# Patient Record
Sex: Male | Born: 1940
Health system: Southern US, Community
[De-identification: ages and names within clinical notes are randomized; demographics above are authoritative.]

## PROBLEM LIST (undated history)

## (undated) DIAGNOSIS — I420 Dilated cardiomyopathy: Secondary | ICD-10-CM

## (undated) DIAGNOSIS — M129 Arthropathy, unspecified: Secondary | ICD-10-CM

## (undated) DIAGNOSIS — Z87442 Personal history of urinary calculi: Secondary | ICD-10-CM

## (undated) DIAGNOSIS — IMO0002 Reserved for concepts with insufficient information to code with codable children: Principal | ICD-10-CM

## (undated) DIAGNOSIS — C4490 Unspecified malignant neoplasm of skin, unspecified: Secondary | ICD-10-CM

## (undated) DIAGNOSIS — G4700 Insomnia, unspecified: Secondary | ICD-10-CM

## (undated) DIAGNOSIS — I509 Heart failure, unspecified: Secondary | ICD-10-CM

## (undated) DIAGNOSIS — Z77090 Contact with and (suspected) exposure to asbestos: Secondary | ICD-10-CM

## (undated) DIAGNOSIS — I10 Essential (primary) hypertension: Secondary | ICD-10-CM

## (undated) DIAGNOSIS — K5730 Diverticulosis of large intestine without perforation or abscess without bleeding: Secondary | ICD-10-CM

## (undated) DIAGNOSIS — H409 Unspecified glaucoma: Secondary | ICD-10-CM

## (undated) DIAGNOSIS — IMO0001 Reserved for inherently not codable concepts without codable children: Secondary | ICD-10-CM

## (undated) DIAGNOSIS — E079 Disorder of thyroid, unspecified: Secondary | ICD-10-CM

## (undated) DIAGNOSIS — Z9289 Personal history of other medical treatment: Secondary | ICD-10-CM

## (undated) DIAGNOSIS — N400 Enlarged prostate without lower urinary tract symptoms: Secondary | ICD-10-CM

## (undated) DIAGNOSIS — C140 Malignant neoplasm of pharynx, unspecified: Secondary | ICD-10-CM

## (undated) DIAGNOSIS — E7800 Pure hypercholesterolemia, unspecified: Secondary | ICD-10-CM

## (undated) HISTORY — DX: Pure hypercholesterolemia, unspecified: E78.00

## (undated) HISTORY — DX: Benign prostatic hyperplasia without lower urinary tract symptoms: N40.0

## (undated) HISTORY — DX: Reserved for concepts with insufficient information to code with codable children: IMO0002

## (undated) HISTORY — DX: Dilated cardiomyopathy: I42.0

## (undated) HISTORY — DX: Disorder of thyroid, unspecified: E07.9

## (undated) HISTORY — DX: Personal history of other medical treatment: Z92.89

## (undated) HISTORY — DX: Essential (primary) hypertension: I10

## (undated) HISTORY — DX: Arthropathy, unspecified: M12.9

## (undated) HISTORY — DX: Reserved for inherently not codable concepts without codable children: IMO0001

## (undated) HISTORY — DX: Heart failure, unspecified: I50.9

## (undated) HISTORY — DX: Malignant neoplasm of pharynx, unspecified: C14.0

## (undated) HISTORY — PX: LYMPH NODE BIOPSY: SHX201

## (undated) HISTORY — DX: Diverticulosis of large intestine without perforation or abscess without bleeding: K57.30

## (undated) HISTORY — DX: Insomnia, unspecified: G47.00

## (undated) HISTORY — DX: Personal history of urinary calculi: Z87.442

## (undated) HISTORY — DX: Unspecified glaucoma: H40.9

---

## 2001-09-28 ENCOUNTER — Encounter: Admission: RE | Admit: 2001-09-28 | Discharge: 2001-09-28 | Payer: Self-pay | Admitting: Family Medicine

## 2001-09-28 ENCOUNTER — Encounter: Payer: Self-pay | Admitting: Family Medicine

## 2001-10-11 ENCOUNTER — Emergency Department (HOSPITAL_COMMUNITY): Admission: EM | Admit: 2001-10-11 | Discharge: 2001-10-12 | Payer: Self-pay | Admitting: Emergency Medicine

## 2001-10-12 ENCOUNTER — Encounter: Payer: Self-pay | Admitting: Emergency Medicine

## 2001-10-16 ENCOUNTER — Ambulatory Visit (HOSPITAL_COMMUNITY): Admission: RE | Admit: 2001-10-16 | Discharge: 2001-10-16 | Payer: Self-pay | Admitting: Cardiology

## 2001-10-17 HISTORY — PX: CARDIAC CATHETERIZATION: SHX172

## 2003-12-06 ENCOUNTER — Emergency Department (HOSPITAL_COMMUNITY): Admission: EM | Admit: 2003-12-06 | Discharge: 2003-12-06 | Payer: Self-pay | Admitting: Emergency Medicine

## 2004-08-11 ENCOUNTER — Ambulatory Visit: Payer: Self-pay | Admitting: Family Medicine

## 2004-08-13 ENCOUNTER — Encounter: Admission: RE | Admit: 2004-08-13 | Discharge: 2004-08-13 | Payer: Self-pay | Admitting: Family Medicine

## 2005-05-17 ENCOUNTER — Ambulatory Visit: Payer: Self-pay | Admitting: Family Medicine

## 2005-05-25 ENCOUNTER — Ambulatory Visit: Payer: Self-pay | Admitting: Family Medicine

## 2005-08-11 ENCOUNTER — Ambulatory Visit: Payer: Self-pay | Admitting: Family Medicine

## 2005-08-16 ENCOUNTER — Ambulatory Visit: Payer: Self-pay | Admitting: Family Medicine

## 2006-02-14 ENCOUNTER — Ambulatory Visit: Payer: Self-pay | Admitting: Family Medicine

## 2006-04-19 ENCOUNTER — Ambulatory Visit: Payer: Self-pay | Admitting: Family Medicine

## 2006-05-19 ENCOUNTER — Ambulatory Visit: Payer: Self-pay | Admitting: Family Medicine

## 2006-05-26 ENCOUNTER — Ambulatory Visit: Payer: Self-pay | Admitting: Family Medicine

## 2006-08-05 ENCOUNTER — Ambulatory Visit: Payer: Self-pay | Admitting: Family Medicine

## 2006-08-25 ENCOUNTER — Ambulatory Visit: Payer: Self-pay | Admitting: Family Medicine

## 2006-08-25 LAB — CONVERTED CEMR LAB
AST: 23 units/L (ref 0–37)
Bilirubin, Direct: 0.1 mg/dL (ref 0.0–0.3)
Chol/HDL Ratio, serum: 4.5
HDL: 29.8 mg/dL — ABNORMAL LOW (ref >39.0)
Total Bilirubin: 0.5 mg/dL (ref 0.3–1.2)
Triglyceride fasting, serum: 126 mg/dL (ref 0–149)
VLDL: 25 mg/dL (ref 0–40)

## 2006-11-10 ENCOUNTER — Ambulatory Visit: Payer: Self-pay | Admitting: Family Medicine

## 2007-05-15 DIAGNOSIS — I10 Essential (primary) hypertension: Secondary | ICD-10-CM

## 2007-05-15 DIAGNOSIS — E7800 Pure hypercholesterolemia, unspecified: Secondary | ICD-10-CM | POA: Insufficient documentation

## 2007-05-22 DIAGNOSIS — Z9289 Personal history of other medical treatment: Secondary | ICD-10-CM

## 2007-05-22 HISTORY — DX: Personal history of other medical treatment: Z92.89

## 2007-06-06 ENCOUNTER — Ambulatory Visit: Payer: Self-pay | Admitting: Family Medicine

## 2007-06-06 LAB — CONVERTED CEMR LAB
Bilirubin Urine: NEGATIVE
Glucose, Urine, Semiquant: NEGATIVE
WBC Urine, dipstick: NEGATIVE

## 2007-06-13 ENCOUNTER — Ambulatory Visit: Payer: Self-pay | Admitting: Family Medicine

## 2007-06-13 DIAGNOSIS — I420 Dilated cardiomyopathy: Secondary | ICD-10-CM | POA: Insufficient documentation

## 2007-06-13 DIAGNOSIS — Z8739 Personal history of other diseases of the musculoskeletal system and connective tissue: Secondary | ICD-10-CM

## 2007-06-14 LAB — CONVERTED CEMR LAB
ALT: 37 units/L (ref 0–53)
Albumin: 3.5 g/dL (ref 3.5–5.2)
Alkaline Phosphatase: 72 units/L (ref 39–117)
BUN: 16 mg/dL (ref 6–23)
Basophils Absolute: 0 10*3/uL (ref 0.0–0.1)
Basophils Relative: 0.2 % (ref 0.0–1.0)
CO2: 32 meq/L (ref 19–32)
Calcium: 9.5 mg/dL (ref 8.4–10.5)
Creatinine, Ser: 1.1 mg/dL (ref 0.4–1.5)
GFR calc Af Amer: 87 mL/min
HDL: 34.5 mg/dL — ABNORMAL LOW (ref >39.0)
LDL Cholesterol: 62 mg/dL (ref 0–99)
MCHC: 33.7 g/dL (ref 30.0–36.0)
Monocytes Relative: 9.6 % (ref 3.0–11.0)
Platelets: 247 10*3/uL (ref 150–400)
RBC: 5.15 M/uL (ref 4.22–5.81)
RDW: 18.8 % — ABNORMAL HIGH (ref 11.5–14.6)
TSH: 1.13 microintl units/mL (ref 0.35–5.50)
Total Bilirubin: 0.5 mg/dL (ref 0.3–1.2)
Total CHOL/HDL Ratio: 3.4
Total Protein: 6.1 g/dL (ref 6.0–8.3)
Triglycerides: 111 mg/dL (ref 0–149)
VLDL: 22 mg/dL (ref 0–40)

## 2007-06-19 ENCOUNTER — Encounter: Payer: Self-pay | Admitting: Family Medicine

## 2007-06-26 ENCOUNTER — Encounter: Payer: Self-pay | Admitting: Family Medicine

## 2007-07-31 ENCOUNTER — Ambulatory Visit: Payer: Self-pay | Admitting: Internal Medicine

## 2007-08-15 ENCOUNTER — Ambulatory Visit: Payer: Self-pay | Admitting: Internal Medicine

## 2007-08-15 ENCOUNTER — Encounter: Payer: Self-pay | Admitting: Family Medicine

## 2007-08-18 ENCOUNTER — Ambulatory Visit: Payer: Self-pay | Admitting: Family Medicine

## 2007-12-08 DIAGNOSIS — K5730 Diverticulosis of large intestine without perforation or abscess without bleeding: Secondary | ICD-10-CM | POA: Insufficient documentation

## 2007-12-08 DIAGNOSIS — Z8679 Personal history of other diseases of the circulatory system: Secondary | ICD-10-CM | POA: Insufficient documentation

## 2007-12-08 DIAGNOSIS — K648 Other hemorrhoids: Secondary | ICD-10-CM

## 2007-12-08 DIAGNOSIS — K644 Residual hemorrhoidal skin tags: Secondary | ICD-10-CM | POA: Insufficient documentation

## 2007-12-08 DIAGNOSIS — K625 Hemorrhage of anus and rectum: Secondary | ICD-10-CM

## 2007-12-08 HISTORY — DX: Diverticulosis of large intestine without perforation or abscess without bleeding: K57.30

## 2007-12-18 ENCOUNTER — Encounter: Payer: Self-pay | Admitting: Family Medicine

## 2008-06-20 ENCOUNTER — Ambulatory Visit: Payer: Self-pay | Admitting: Family Medicine

## 2008-06-20 LAB — CONVERTED CEMR LAB
Blood in Urine, dipstick: NEGATIVE
Glucose, Urine, Semiquant: NEGATIVE
Ketones, urine, test strip: NEGATIVE
WBC Urine, dipstick: NEGATIVE
pH: 6

## 2008-06-27 ENCOUNTER — Ambulatory Visit: Payer: Self-pay | Admitting: Family Medicine

## 2008-06-27 LAB — CONVERTED CEMR LAB
AST: 23 units/L (ref 0–37)
Albumin: 3.7 g/dL (ref 3.5–5.2)
Alkaline Phosphatase: 73 units/L (ref 39–117)
BUN: 16 mg/dL (ref 6–23)
Basophils Absolute: 0 10*3/uL (ref 0.0–0.1)
Bilirubin, Direct: 0.1 mg/dL (ref 0.0–0.3)
Chloride: 105 meq/L (ref 96–112)
Eosinophils Absolute: 0.1 10*3/uL (ref 0.0–0.7)
GFR calc Af Amer: 87 mL/min
GFR calc non Af Amer: 72 mL/min
HCT: 40.2 % (ref 39.0–52.0)
HDL: 30.2 mg/dL — ABNORMAL LOW (ref >39.0)
MCHC: 33.6 g/dL (ref 30.0–36.0)
MCV: 90.3 fL (ref 78.0–100.0)
Monocytes Absolute: 0.6 10*3/uL (ref 0.1–1.0)
Neutrophils Relative %: 60.7 % (ref 43.0–77.0)
Platelets: 236 10*3/uL (ref 150–400)
Potassium: 3.8 meq/L (ref 3.5–5.1)
RDW: 13.7 % (ref 11.5–14.6)
Sodium: 141 meq/L (ref 135–145)
Total Bilirubin: 0.5 mg/dL (ref 0.3–1.2)
Triglycerides: 94 mg/dL (ref 0–149)
VLDL: 19 mg/dL (ref 0–40)
WBC: 5.6 10*3/uL (ref 4.5–10.5)

## 2008-07-04 ENCOUNTER — Telehealth: Payer: Self-pay | Admitting: Family Medicine

## 2008-07-11 ENCOUNTER — Ambulatory Visit: Payer: Self-pay | Admitting: Family Medicine

## 2008-07-15 ENCOUNTER — Encounter: Payer: Self-pay | Admitting: Family Medicine

## 2008-07-24 ENCOUNTER — Ambulatory Visit: Payer: Self-pay | Admitting: Family Medicine

## 2008-07-24 DIAGNOSIS — M67919 Unspecified disorder of synovium and tendon, unspecified shoulder: Secondary | ICD-10-CM | POA: Insufficient documentation

## 2008-07-24 DIAGNOSIS — M719 Bursopathy, unspecified: Secondary | ICD-10-CM

## 2008-08-14 ENCOUNTER — Telehealth: Payer: Self-pay | Admitting: Family Medicine

## 2008-10-22 ENCOUNTER — Ambulatory Visit: Payer: Self-pay | Admitting: Family Medicine

## 2008-10-22 DIAGNOSIS — M129 Arthropathy, unspecified: Secondary | ICD-10-CM

## 2008-10-22 HISTORY — DX: Arthropathy, unspecified: M12.9

## 2008-11-28 ENCOUNTER — Ambulatory Visit: Payer: Self-pay | Admitting: Family Medicine

## 2008-11-28 DIAGNOSIS — IMO0002 Reserved for concepts with insufficient information to code with codable children: Secondary | ICD-10-CM

## 2009-01-13 ENCOUNTER — Encounter: Payer: Self-pay | Admitting: Family Medicine

## 2009-06-12 ENCOUNTER — Ambulatory Visit: Payer: Self-pay | Admitting: Family Medicine

## 2009-06-19 ENCOUNTER — Ambulatory Visit: Payer: Self-pay | Admitting: Family Medicine

## 2009-06-19 LAB — CONVERTED CEMR LAB
Bilirubin, Direct: 0.1 mg/dL (ref 0.0–0.3)
Chloride: 107 meq/L (ref 96–112)
Eosinophils Absolute: 0.1 10*3/uL (ref 0.0–0.7)
GFR calc non Af Amer: 64.43 mL/min (ref >60)
Glucose, Bld: 111 mg/dL — ABNORMAL HIGH (ref 70–99)
HDL: 30.1 mg/dL — ABNORMAL LOW (ref >39.00)
LDL Cholesterol: 67 mg/dL (ref 0–99)
Lymphocytes Relative: 24.1 % (ref 12.0–46.0)
MCHC: 33.6 g/dL (ref 30.0–36.0)
MCV: 89.6 fL (ref 78.0–100.0)
Monocytes Absolute: 0.6 10*3/uL (ref 0.1–1.0)
Neutrophils Relative %: 65.6 % (ref 43.0–77.0)
Nitrite: NEGATIVE
PSA: 1.43 ng/mL (ref 0.10–4.00)
Platelets: 225 10*3/uL (ref 150.0–400.0)
Potassium: 4.5 meq/L (ref 3.5–5.1)
Sodium: 141 meq/L (ref 135–145)
Specific Gravity, Urine: 1.02 (ref 1.000–1.030)
Total Bilirubin: 0.6 mg/dL (ref 0.3–1.2)
Total Protein, Urine: NEGATIVE mg/dL
VLDL: 33.2 mg/dL (ref 0.0–40.0)
WBC: 6.6 10*3/uL (ref 4.5–10.5)
pH: 6 (ref 5.0–8.0)

## 2009-06-24 ENCOUNTER — Telehealth (INDEPENDENT_AMBULATORY_CARE_PROVIDER_SITE_OTHER): Payer: Self-pay | Admitting: *Deleted

## 2009-07-28 ENCOUNTER — Encounter: Payer: Self-pay | Admitting: Family Medicine

## 2009-08-12 ENCOUNTER — Ambulatory Visit: Payer: Self-pay | Admitting: Family Medicine

## 2009-09-11 ENCOUNTER — Encounter: Payer: Self-pay | Admitting: Internal Medicine

## 2009-09-11 ENCOUNTER — Emergency Department (HOSPITAL_COMMUNITY): Admission: EM | Admit: 2009-09-11 | Discharge: 2009-09-11 | Payer: Self-pay | Admitting: Emergency Medicine

## 2009-09-30 ENCOUNTER — Encounter: Admission: RE | Admit: 2009-09-30 | Discharge: 2009-10-14 | Payer: Self-pay | Admitting: Orthopedic Surgery

## 2009-10-14 ENCOUNTER — Encounter: Admission: RE | Admit: 2009-10-14 | Discharge: 2009-12-24 | Payer: Self-pay | Admitting: Orthopedic Surgery

## 2010-01-26 ENCOUNTER — Encounter: Payer: Self-pay | Admitting: Family Medicine

## 2010-06-24 ENCOUNTER — Telehealth: Payer: Self-pay | Admitting: Family Medicine

## 2010-06-25 ENCOUNTER — Ambulatory Visit: Payer: Self-pay | Admitting: Family Medicine

## 2010-08-04 ENCOUNTER — Ambulatory Visit: Payer: Self-pay | Admitting: Family Medicine

## 2010-08-04 LAB — CONVERTED CEMR LAB
ALT: 31 U/L
AST: 22 U/L
Albumin: 3.6 g/dL
Alkaline Phosphatase: 68 U/L
BUN: 17 mg/dL
Basophils Absolute: 0.1 K/uL
Basophils Relative: 1.1 %
Bilirubin Urine: NEGATIVE
Bilirubin, Direct: 0.1 mg/dL
Blood in Urine, dipstick: NEGATIVE
CO2: 29 meq/L
Calcium: 9 mg/dL
Chloride: 107 meq/L
Cholesterol: 122 mg/dL
Creatinine, Ser: 1.2 mg/dL
Eosinophils Absolute: 0.1 K/uL
Eosinophils Relative: 1.9 %
GFR calc non Af Amer: 61.82 mL/min
Glucose, Bld: 100 mg/dL — ABNORMAL HIGH
Glucose, Urine, Semiquant: NEGATIVE
HCT: 44.7 %
HDL: 28.5 mg/dL — ABNORMAL LOW
Hemoglobin: 15 g/dL
Ketones, urine, test strip: NEGATIVE
LDL Cholesterol: 57 mg/dL
Lymphocytes Relative: 25.2 %
Lymphs Abs: 1.7 K/uL
MCHC: 33.6 g/dL
MCV: 92.7 fL
Monocytes Absolute: 0.6 K/uL
Monocytes Relative: 8.7 %
Neutro Abs: 4.3 K/uL
Neutrophils Relative %: 63.1 %
Nitrite: NEGATIVE
PSA: 1.63 ng/mL
Platelets: 224 K/uL
Potassium: 4 meq/L
Protein, U semiquant: NEGATIVE
RBC: 4.83 M/uL
RDW: 13.7 %
Sodium: 141 meq/L
Specific Gravity, Urine: 1.025
TSH: 0.73 u[IU]/mL
Total Bilirubin: 0.5 mg/dL
Total CHOL/HDL Ratio: 4
Total Protein: 6.3 g/dL
Triglycerides: 184 mg/dL — ABNORMAL HIGH
Urobilinogen, UA: 0.2
VLDL: 36.8 mg/dL
WBC Urine, dipstick: NEGATIVE
WBC: 6.8 10*3/microliter
pH: 6

## 2010-08-11 ENCOUNTER — Ambulatory Visit: Payer: Self-pay | Admitting: Family Medicine

## 2010-08-11 ENCOUNTER — Encounter: Payer: Self-pay | Admitting: Internal Medicine

## 2010-08-20 ENCOUNTER — Ambulatory Visit: Payer: Self-pay | Admitting: Cardiology

## 2010-08-20 ENCOUNTER — Encounter: Payer: Self-pay | Admitting: Family Medicine

## 2010-08-24 ENCOUNTER — Encounter: Payer: Self-pay | Admitting: Family Medicine

## 2010-09-25 ENCOUNTER — Ambulatory Visit: Payer: Self-pay | Admitting: Internal Medicine

## 2010-10-13 ENCOUNTER — Telehealth: Payer: Self-pay | Admitting: Family Medicine

## 2010-10-14 ENCOUNTER — Ambulatory Visit
Admission: RE | Admit: 2010-10-14 | Discharge: 2010-10-14 | Payer: Self-pay | Source: Home / Self Care | Attending: Internal Medicine | Admitting: Internal Medicine

## 2010-11-10 NOTE — Letter (Signed)
Summary: New Patient letter  [facility]  [address] [place], [place] [address]   Phone: [phone]  Fax: [phone]       08/11/2010 MRN: [id number]  Nicholas Wilkerson [address] [place], [place]  [address]  Dear [name],  Welcome to the Gastroenterology Division at [facility].    You are scheduled to see Dr.  Leone Payor on 09-25-10 at 10:30am on the 3rd floor at [facility], [place], [address].  We ask that you try to arrive at our office 15 minutes prior to your appointment time to allow for check-in.  We would like you to complete the enclosed self-administered evaluation form prior to your visit and bring it with you on the day of your appointment.  We will review it with you.  Also, please bring a complete list of all your medications or, if you prefer, bring the medication bottles and we will list them.  Please bring your insurance card so that we may make a copy of it.  If your insurance requires a referral to see a specialist, please bring your referral form from your primary care physician.  Co-payments are due at the time of your visit and may be paid by cash, check or credit card.     Your office visit will consist of a consult with your physician (includes a physical exam), any laboratory testing he/she may order, scheduling of any necessary diagnostic testing (e.g. x-ray, ultrasound, CT-scan), and scheduling of a procedure (e.g. Endoscopy, Colonoscopy) if required.  Please allow enough time on your schedule to allow for any/all of these possibilities.    If you cannot keep your appointment, please call [phone] to cancel or reschedule prior to your appointment date.  This allows [place] the opportunity to schedule an appointment for another patient in need of care.  If you do not cancel or reschedule by 5 p.m. the business day prior to your appointment date, you will be charged a $50.00 late cancellation/no-show fee.    Thank you for choosing  [facility] for your medical needs.  We appreciate the opportunity to care for you.  Please visit [place] at our website  to learn more about our practice.                     Sincerely,                                                             The Gastroenterology Division
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
## 2010-11-10 NOTE — Assessment & Plan Note (Signed)
Summary: FLU SHOT // RS  Nurse Visit   Allergies: No Known Drug Allergies  Review of Systems       Flu Vaccine Consent Questions     Do you have a history of severe allergic reactions to this vaccine? no    Any prior history of allergic reactions to egg and/or gelatin? no    Do you have a sensitivity to the preservative Thimersol? no    Do you have a past history of Guillan-Barre Syndrome? no    Do you currently have an acute febrile illness? no    Have you ever had a severe reaction to latex? no    Vaccine information given and explained to patient? yes    Are you currently pregnant? no    Lot Number:[id number]   Exp Date:04/10/2011   Site Given  Left Deltoid IM Josph Macho RMA  June 25, 2010 1:57 PM    Orders Added: 1)  Admin 1st Vaccine [[address]] 2)  Flu Vaccine 2yrs + [[address]]

## 2010-11-10 NOTE — Letter (Signed)
Summary: [facility]   Imported By: Maryln Gottron 02/06/2010 14:34:38  _____________________________________________________________________  External Attachment:    Type:   Image     Comment:   External Document

## 2010-11-10 NOTE — Progress Notes (Signed)
Summary: Proctozone cream refill  Phone Note Refill Request Message from:  Fax from Pharmacy on June 24, 2010 4:36 PM  Refills Requested: Medication #1:  PROCTOZONE-HC 2.5 % CREA 1 applicator two times a day.   Dosage confirmed as above?Dosage Confirmed Please advise refill?  [facility] Left a message on pts vm to return my call. Pt hasn't been seen since 9/10  Initial call taken by: Josph Macho RMA,  June 24, 2010 4:36 PM  Follow-up for Phone Call        OK to refill with same sig and same quantity with 1 additional rf if patient feels it is helpful Follow-up by: Danise Edge MD,  June 24, 2010 4:57 PM    Prescriptions: PROCTOZONE-HC 2.5 % CREA (HYDROCORTISONE) 1 applicator two times a day  #1 x 1   Entered by:   Josph Macho RMA   Authorized by:   Danise Edge MD   Signed by:   Josph Macho RMA on 06/25/2010   Method used:   Electronically to        [facility] (retail)       [address] [place], [place]  [address]       Ph: [phone] or [phone]       Fax: [phone]   RxID:   [id number] PROCTOZONE-HC 2.5 % CREA (HYDROCORTISONE) 1 applicator two times a day  #1 x 1   Entered by:   Josph Macho RMA   Authorized by:   Danise Edge MD   Signed by:   Josph Macho RMA on 06/25/2010   Method used:   Electronically to        [facility] (retail)       [address]       [place], [place]  [address]       Ph: [phone]       Fax: [phone]   RxID:   [id number]   Appended Document: Proctozone cream refill INFO ONLY-----Pt has appt with Dr Scotty Court for cpx on 11/2.

## 2010-11-10 NOTE — Letter (Signed)
Summary: [facility]   Imported By: Maryln Gottron 08/27/2010 09:26:46  _____________________________________________________________________  External Attachment:    Type:   Image     Comment:   External Document

## 2010-11-12 NOTE — Assessment & Plan Note (Signed)
Summary: cough congestion   Vital Signs:  Patient profile:   70 year old male Weight:      190 pounds Pulse rate:   62 / minute BP sitting:   110 / 78  (left arm)  Vitals Entered By: Kyung Rudd, CMA (October 14, 2010 11:42 AM) CC: cough, congestion...denies fever. requesting mycoplasma rx   Primary Care Provider:  London Sheer  CC:  cough and congestion...denies fever. requesting mycoplasma rx.  History of Present Illness: Patient presents to clinic as a workin for evaluation of URI symptoms. Notes 2-3 wk h/o URI with c/o NP cough, chest congestion, malaise, nasal congestion without f/c, dyspnea, wheezing or hemoptysis. Attempting mucinex without improvement. +sick exposoure with granddaughter being diagnosed with mycoplasma. Also notes h/o arthritis improved by diclofenac as needed. Denies abd pain, burning or rectal bleeding. Takes nsaid with food. No known h/o renal insufficiency or abn lft. Requests RF.  Current Medications (verified): 1)  Coreg 25 Mg  Tabs (Carvedilol) .... Take 1 Tablet By Mouth Two Times A Day Dr [place] 2)  Furosemide 40 Mg  Tabs (Furosemide) .... Take 1 Tablet By Mouth Once A Day Dr [place] 3)  Klor-Con M10 10 Meq  Tbcr (Potassium Chloride Crys Cr) .... Take 1 Tablet By Mouth Once A Day Dr [place] 4)  Accupril 20 Mg  Tabs (Quinapril Hcl) .... Take 1 Tablet By Mouth Once A Day 5)  Simvastatin 40 Mg  Tabs (Simvastatin) .... Take 1 Tablet By Mouth At Bedtime 6)  Aspirin 81 Mg  Tbec (Aspirin) .... Once Daily 7)  Diclofenac Sodium 75 Mg  Tbec (Diclofenac Sodium) .... Take 1 Tablet By Mouth Two Times A Day After Meals 8)  Analpram E 2.5-1 & 1 % Kit (Hydrocortisone Ace-Pramoxine) .... Insert and Apply Two Times A Day As Needed 9)  Proctozone-Hc 2.5 % Crea (Hydrocortisone) .Marland Kitchen.. 1 Applicator Two Times A Day  Allergies (verified): No Known Drug Allergies  Past History:  Past medical history reviewed for relevance to current acute and chronic  problems.  Past Medical History: Reviewed history from 09/24/2010 and no changes required. dilated cardiomyopathy hx arthritis  mild Diverticulosis Hemorrhoids Hyperlipidemia Hypertension  Past Surgical History: Reviewed history from 09/25/2010 and no changes required. No Surgeries  Family History: Reviewed history from 09/25/2010 and no changes required. No FH of Colon Cancer:  Social History: Reviewed history from 09/25/2010 and no changes required. 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 No apparent alopecia or balding. Eyes:  pupils equal, pupils round, corneas and lenses clear, and no injection.   Ears:  R ear normal, L ear normal, and no external deformities.   Nose:  External nasal examination shows no deformity or inflammation. Nasal mucosa are pink and moist without lesions or exudates. Mouth:  Oral mucosa and oropharynx without lesions or exudates.  Teeth in good repair. Neck:  No deformities, masses, or tenderness noted. Lungs:  Normal respiratory effort, chest expands symmetrically. Lungs are clear to auscultation, no crackles or wheezes. Heart:  Normal rate and regular rhythm. S1 and S2 normal without gallop, murmur, click, rub or other extra sounds. Skin:  turgor normal, color normal, and no rashes.   Cervical Nodes:  No  lymphadenopathy noted   Impression & Recommendations:  Problem # 1:  URI (ICD-465.9) Assessment New  Begin abx tx with macrolide given recent mycoplasma exposure. Followup if no improvement or worsening.  Problem # 2:  ARTHRITIS (ICD-716.90) Assessment: Unchanged Rf diclofenac. Cautioned regarding possible GI  adverse effect. Take with food and no other nsaids. Pt to f/u with pmd for periodic renal and hepatic monitoring given chronic use.  Complete Medication List: 1)  Coreg 25 Mg Tabs (Carvedilol) .... Take 1 tablet by mouth two times a day dr [place] 2)  Furosemide 40 Mg Tabs (Furosemide) .... Take 1 tablet by mouth once a day dr [place] 3)  Klor-con M10 10 Meq Tbcr (Potassium chloride crys cr) .... Take 1 tablet by mouth once a day dr [place] 4)  Accupril 20 Mg Tabs (Quinapril hcl) .... Take 1 tablet by mouth once a day 5)  Simvastatin 40 Mg Tabs (Simvastatin) .... Take 1 tablet by mouth at bedtime 6)  Aspirin 81 Mg Tbec (Aspirin) .... Once daily 7)  Diclofenac Sodium 75 Mg Tbec (Diclofenac sodium) .... Take 1 tablet by mouth two times a day after meals 8)  Analpram E 2.5-1 & 1 % Kit (Hydrocortisone ace-pramoxine) .... Insert and apply two times a day as needed 9)  Proctozone-hc 2.5 % Crea (Hydrocortisone) .Marland Kitchen.. 1 applicator two times a day 10)  Zithromax Z-pak 250 Mg Tabs (Azithromycin) .... As directed Prescriptions: ZITHROMAX Z-PAK 250 MG TABS (AZITHROMYCIN) as directed  #1 x 0   Entered and Authorized by:   Edwyna Perfect MD   Signed by:   Edwyna Perfect MD on 10/14/2010   Method used:   Electronically to        [facility] (retail)       [address] [place], [place]  [address]       Ph: [phone] or [phone]       Fax: [phone]   RxID:   [id number] DICLOFENAC SODIUM 75 MG  TBEC (DICLOFENAC SODIUM) Take 1 tablet by mouth two times a day after meals  #180 x 3   Entered and Authorized by:   Edwyna Perfect MD   Signed by:   Edwyna Perfect MD on 10/14/2010   Method used:   Electronically to        [facility] (retail)       [address]       [place], [place]  [address]       Ph: [phone]       Fax: [phone]   RxID:   [id number]    Orders Added: 1)  Est.  Patient Level IV [[address]]

## 2010-11-12 NOTE — Progress Notes (Signed)
Summary: wants ov with dr Scotty Court  Phone Note Call from Patient Call back at Home Phone [phone]   Caller: Patient---live call Summary of Call: pt wants to be seen by dr Scotty Court. has congestion. refused to see another dr. Initial call taken by: Warnell Forester,  October 13, 2010 2:33 PM  Follow-up for Phone Call         productive cough congestion  would like appt with dr stfford, none available . Offered appt with Dr Rodena Medin -pt accepted --appt tomorrow at 11:30 am  Follow-up by: Pura Spice, RN,  October 13, 2010 4:19 PM

## 2010-11-12 NOTE — Assessment & Plan Note (Signed)
Summary: Passed a foreign body   History of Present Illness Visit Type: Initial Consult Primary GI MD: Stan Head MD [facility] Primary Provider: London Sheer Requesting Provider: Maricela Curet Chief Complaint: Internal hemorrhoids pt passed something he wants to discuss  History of Present Illness:   70 yo wm passed somethng unusual with with defecation the other month. He describes a rectangular shape with three small prongs on either end. He had thought his hemorrhoids had been  bothering him, he applied hemorrhoid cream and felt something there - after this item passed he had no more symptoms  He saw this floating after he flushed and then flushed it down.Dr. Ander Gaster performed O&P studies and fungal cx (uyeast found).  He is ok without bowel problems now. rare miniscule rectal bleeding, chronically and only on the toilet paper.          Clinical Reports Reviewed:  Colonoscopy:  08/15/2007:  1) SMALL EXTERNAL HEMORRHOIDS 2) NO POLYPS OR CANCER, OTHERWISE NORMAL 3) EXCELLENT PREP 4) AVERAGE-RISK FOR COLON CANCER  10/11/2001:  historical   Preventive Screening-Counseling & Management      Drug Use:  no.      Current Medications (verified): 1)  Coreg 25 Mg  Tabs (Carvedilol) .... Take 1 Tablet By Mouth Two Times A Day Dr [place] 2)  Furosemide 40 Mg  Tabs (Furosemide) .... Take 1 Tablet By Mouth Once A Day Dr [place] 3)  Klor-Con M10 10 Meq  Tbcr (Potassium Chloride Crys Cr) .... Take 1 Tablet By Mouth Once A Day Dr [place] 4)  Accupril 20 Mg  Tabs (Quinapril Hcl) .... Take 1 Tablet By Mouth Once A Day 5)  Simvastatin 40 Mg  Tabs (Simvastatin) .... Take 1 Tablet By Mouth At Bedtime 6)  Aspirin 81 Mg  Tbec (Aspirin) .... Once Daily 7)  Diclofenac Sodium 75 Mg  Tbec (Diclofenac Sodium) .... Take 1 Tablet By Mouth Two Times A Day After Meals 8)  Analpram E 2.5-1 & 1 % Kit (Hydrocortisone Ace-Pramoxine) .... Insert and Apply Two Times A Day As Needed 9)  Proctozone-Hc 2.5 %  Crea (Hydrocortisone) .Marland Kitchen.. 1 Applicator Two Times A Day  Allergies (verified): No Known Drug Allergies  Past History:  Past Medical History: Reviewed history from 09/24/2010 and no changes required. 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 Rectal:  no mass, scanty brown stool, normal tone Prostate:  Normal   Impression & Recommendations:  Problem # 1:  FOREIGN BODY IN INTESTINE AND COLON (ICD-936) Assessment New As best I can tell I think ingested and passed a foreign body. No further evaluaton is necessary. If it recurs he will try to photo or collect.  Patient Instructions: 1)  Please schedule  a follow-up appointment as needed.  2)  If you pass something unusual again please try to photograph and or collect it and let us know. 3)  Copy sent to : Cecille Rubin, MD 4)  The medication list was reviewed and reconciled.  All changed / newly prescribed medications were explained.  A complete medication list was provided to the patient / caregiver.

## 2010-12-21 ENCOUNTER — Ambulatory Visit (INDEPENDENT_AMBULATORY_CARE_PROVIDER_SITE_OTHER): Payer: Medicare Other | Admitting: Family Medicine

## 2010-12-21 ENCOUNTER — Encounter: Payer: Self-pay | Admitting: Family Medicine

## 2010-12-21 VITALS — HR 84 | Temp 98.3°F

## 2010-12-21 DIAGNOSIS — J329 Chronic sinusitis, unspecified: Secondary | ICD-10-CM

## 2010-12-21 MED ORDER — AZITHROMYCIN 250 MG PO TABS: ORAL_TABLET | ORAL | Status: AC

## 2010-12-21 NOTE — Progress Notes (Signed)
  Subjective:    Patient ID: Nicholas Wilkerson, male    DOB: 1941-05-24, 70 y.o.   MRN: [id number]  HPI Here for 3 days of sinus pressure, HA, ST, and a dry cough. On Mucinex.    Review of Systems  Constitutional: Negative.  Negative for fever.  HENT: Positive for congestion, postnasal drip and sinus pressure.   Eyes: Positive for redness and itching. Negative for visual disturbance.  Respiratory: Positive for cough. Negative for shortness of breath and wheezing.        Objective:   Physical Exam  Constitutional: He appears well-developed and well-nourished.  HENT:  Head: Normocephalic and atraumatic.  Left Ear: External ear normal.  Nose: Nose normal.  Mouth/Throat: Oropharynx is clear and moist. No oropharyngeal exudate.  Eyes: Pupils are equal, round, and reactive to light.       Both conjunctivae red   Pulmonary/Chest: Effort normal and breath sounds normal. No respiratory distress. He has no wheezes. He has no rales. He exhibits no tenderness.  Lymphadenopathy:    He has no cervical adenopathy.          Assessment & Plan:  Rest, fluids

## 2010-12-25 ENCOUNTER — Telehealth: Payer: Self-pay | Admitting: *Deleted

## 2010-12-25 MED ORDER — HYDROCODONE-HOMATROPINE 5-1.5 MG/5ML PO SYRP: ORAL_SOLUTION | ORAL | Status: DC

## 2010-12-25 NOTE — Telephone Encounter (Signed)
Pt. Needs cough meds.......cannot sleep at all last night.  [facility].

## 2010-12-25 NOTE — Telephone Encounter (Signed)
Ok to do hydrocodone cough med 1-2 tsp po q4-6 hours prn cough disp 180 cc

## 2010-12-25 NOTE — Telephone Encounter (Signed)
Pt aware and rx called in  

## 2010-12-25 NOTE — Telephone Encounter (Signed)
Pt NEEDS cough meds, please.  Please see previous phone notes.

## 2010-12-25 NOTE — Telephone Encounter (Signed)
Error

## 2010-12-29 ENCOUNTER — Other Ambulatory Visit: Payer: Self-pay | Admitting: Family Medicine

## 2010-12-29 MED ORDER — HYDROCORTISONE 2.5 % RE CREA: TOPICAL_CREAM | Freq: Two times a day (BID) | RECTAL | Status: AC

## 2010-12-29 NOTE — Telephone Encounter (Signed)
Refill for Proctozone hc sent to [facility]

## 2010-12-31 MED ORDER — HYDROCODONE-HOMATROPINE 5-1.5 MG/5ML PO SYRP: 5.0000 mL | ORAL_SOLUTION | Freq: Four times a day (QID) | ORAL | Status: AC | PRN

## 2010-12-31 NOTE — Telephone Encounter (Signed)
Addended byMadison Hickman on: 12/31/2010 04:18 PM   Modules accepted: Orders

## 2010-12-31 NOTE — Telephone Encounter (Signed)
Call in Hydromet syrup, take one tsp q 4 hours prn cough, 240 ml with no rf

## 2010-12-31 NOTE — Telephone Encounter (Signed)
Called to [facility] and pt aware.

## 2011-01-12 LAB — DIFFERENTIAL
Basophils Absolute: 0.1 10*3/uL (ref 0.0–0.1)
Basophils Relative: 1 % (ref 0–1)
Eosinophils Relative: 1 % (ref 0–5)
Lymphocytes Relative: 18 % (ref 12–46)
Monocytes Absolute: 0.6 10*3/uL (ref 0.1–1.0)
Monocytes Relative: 8 % (ref 3–12)

## 2011-01-12 LAB — COMPREHENSIVE METABOLIC PANEL
ALT: 29 U/L (ref 0–53)
AST: 30 U/L (ref 0–37)
CO2: 30 mEq/L (ref 19–32)
Chloride: 104 mEq/L (ref 96–112)
GFR calc non Af Amer: 53 mL/min — ABNORMAL LOW (ref >60)
Sodium: 141 mEq/L (ref 135–145)
Total Bilirubin: 0.8 mg/dL (ref 0.3–1.2)

## 2011-01-12 LAB — CBC
Platelets: 259 10*3/uL (ref 150–400)
WBC: 8.3 10*3/uL (ref 4.0–10.5)

## 2011-01-12 LAB — URINALYSIS, ROUTINE W REFLEX MICROSCOPIC
Bilirubin Urine: NEGATIVE
Hgb urine dipstick: NEGATIVE
Ketones, ur: NEGATIVE mg/dL
Nitrite: NEGATIVE
pH: 5 (ref 5.0–8.0)

## 2011-02-17 ENCOUNTER — Encounter: Payer: Self-pay | Admitting: *Deleted

## 2011-02-18 ENCOUNTER — Other Ambulatory Visit: Payer: Self-pay | Admitting: *Deleted

## 2011-02-18 ENCOUNTER — Ambulatory Visit (INDEPENDENT_AMBULATORY_CARE_PROVIDER_SITE_OTHER): Payer: Medicare Other | Admitting: Cardiology

## 2011-02-18 ENCOUNTER — Encounter: Payer: Self-pay | Admitting: Cardiology

## 2011-02-18 VITALS — BP 118/76 | HR 62 | Ht 69.0 in | Wt 186.5 lb

## 2011-02-18 DIAGNOSIS — I420 Dilated cardiomyopathy: Secondary | ICD-10-CM

## 2011-02-18 DIAGNOSIS — I10 Essential (primary) hypertension: Secondary | ICD-10-CM

## 2011-02-18 DIAGNOSIS — I428 Other cardiomyopathies: Secondary | ICD-10-CM

## 2011-02-18 MED ORDER — FUROSEMIDE 40 MG PO TABS: 40.0000 mg | ORAL_TABLET | Freq: Every day | ORAL | Status: DC

## 2011-02-18 MED ORDER — SIMVASTATIN 40 MG PO TABS: 40.0000 mg | ORAL_TABLET | Freq: Every day | ORAL | Status: DC

## 2011-02-18 MED ORDER — POTASSIUM CHLORIDE CRYS ER 10 MEQ PO TBCR: 10.0000 meq | EXTENDED_RELEASE_TABLET | Freq: Every day | ORAL | Status: DC

## 2011-02-18 MED ORDER — QUINAPRIL HCL 20 MG PO TABS: 20.0000 mg | ORAL_TABLET | Freq: Every day | ORAL | Status: DC

## 2011-02-18 MED ORDER — CARVEDILOL 25 MG PO TABS: 25.0000 mg | ORAL_TABLET | Freq: Two times a day (BID) | ORAL | Status: DC

## 2011-02-18 NOTE — Progress Notes (Signed)
   Nicholas Wilkerson Date of Birth: 01/02/1941   History of Present Illness: Nicholas Wilkerson is seen for followup. He states he has been doing very well. He remains very active. He has had no shortness of breath, PND, or palpitations. He's had no chest pain.  Current Outpatient Prescriptions on File Prior to Visit  Medication Sig Dispense Refill  . ASPIRIN PO Take 81 mg by mouth daily.       . diclofenac (VOLTAREN) 75 MG EC tablet Take 75 mg by mouth daily.        Marland Kitchen HYDROcodone-homatropine (HYCODAN) 5-1.5 MG/5ML syrup Take 1 to 2 tsp po every 4-6 hours prn cough  180 mL  0  . multivitamin (THERAGRAN) per tablet Take 1 tablet by mouth daily.        Marland Kitchen DISCONTD: carvedilol (COREG) 25 MG tablet Take 25 mg by mouth 2 (two) times daily with a meal.        . DISCONTD: furosemide (LASIX) 40 MG tablet Take 40 mg by mouth daily.        Marland Kitchen DISCONTD: potassium chloride SA (K-DUR,KLOR-CON) 10 MEQ tablet Take 10 mEq by mouth daily.        Marland Kitchen DISCONTD: quinapril (ACCUPRIL) 20 MG tablet Take 20 mg by mouth at bedtime.        Marland Kitchen DISCONTD: simvastatin (ZOCOR) 40 MG tablet Take 40 mg by mouth at bedtime.          No Known Allergies  Past Medical History  Diagnosis Date  . Hypertension   . Dilated cardiomyopathy   . Hypercholesterolemia   . DIVERTICULOSIS, COLON 12/08/2007  . ARTHRITIS 10/22/2008  . History of echocardiogram 05/22/2007    EF was 45-50% / Mild concentric LV hypertrophy with mild global hypokinesis and overall mild systolic dysfunction .  Mild AV sclerosis / Mild Mitral insufficiency / compared to prior study 04/24/02 -- LV function has improved further.    . CHF (congestive heart failure)     Past Surgical History  Procedure Date  . Cardiac catheterization 10/17/2001    EF estimated at 30% / moderate LV enlargement  / 1. Minimal nonobstructive atherosclerotic coronary artery disease / 2. Severe LV dysfunction with global hypokinesia consistent with dilated nonischemic cardiomyopath / 3. Moderate  pulmonary hypertension    History  Smoking status  . Never Smoker   Smokeless tobacco  . Never Used    History  Alcohol Use No    Family History  Problem Relation Age of Onset  . Stroke Father   . Angina Mother     Review of Systems:  All other systems were reviewed and are negative.  Physical Exam: BP 118/76  Pulse 62  Ht 5\' 9"  (1.753 m)  Wt 186 lb 8 oz (84.596 kg)  BMI 27.54 kg/m2 He is a pleasant white male in no acute distress. His HEENT exam is unremarkable. No JVD or bruits. Lungs are clear. Cardiac exam reveals a regular rate and rhythm without gallop or murmur. He has no edema. Pedal pulses are good. LABORATORY DATA: ECG demonstrates normal sinus rhythm with occasional PVCs. Otherwise normal.  Assessment / Plan:

## 2011-02-18 NOTE — Assessment & Plan Note (Signed)
He has a chronic nonischemic cardiomyopathy. This is very well compensated on medical therapy. We will schedule a followup echocardiogram at this time.

## 2011-02-18 NOTE — Assessment & Plan Note (Signed)
Well controlled on medical therapy.

## 2011-02-18 NOTE — Telephone Encounter (Signed)
Refill on all cardiac medications sent to [facility]

## 2011-02-18 NOTE — Patient Instructions (Signed)
We will schedule you for an Echocardiogram  Continue current medications.  I will see you back in 6 months.

## 2011-02-23 NOTE — Assessment & Plan Note (Signed)
[facility]                         GASTROENTEROLOGY OFFICE NOTE   NAME:Nicholas Wilkerson, Panther                      MRN:          [id number]  DATE:07/31/2007                            DOB:          1941-03-01    CHIEF COMPLAINT:  Rectal bleeding.   HISTORY:  Nicholas Wilkerson is a 70 year old white man who had a colonoscopy  about 5 years ago.  He does have a history of hemorrhoids, but he has  noted some rectal bleeding again.  He was somewhat concerned about  waiting 10 years for a repeat screening study, but he has had symptoms  in the interim.  Bright red blood per rectum at times.  GI review of  systems is otherwise negative.   MEDICATIONS:  1. Quinapril 20 mg daily.  2. Coreg 0.625 mg daily.  3. Furosemide 40 mg daily.  4. Potassium chloride daily.  5. Simvastatin 40 mg daily.  6. Diclofenac 75 mg a day.   No known drug allergies.   PAST MEDICAL HISTORY:  Hemorrhoids on colonoscopy 5 years ago,  hypertension, history of congestive heart failure.   FAMILY HISTORY:  No colon cancer reported.   SOCIAL HISTORY:  He is married.  Retired from [employer].  He was a  Therapist, occupational.  Some alcohol.  Four daughters.  No tobacco or drug use.   REVIEW OF SYSTEMS:  Otherwise, negative.   PHYSICAL EXAM:  Height 5 feet 9 inches, weight 185 pounds, blood  pressure 120/72, pulse 60.  LUNGS:  Clear.  NECK:  Supple.  HEART:  S1, S2.  No murmurs, rubs, or gallops.  ABDOMEN:  Soft, nontender.  No organomegaly or mass.  PSYCH:  He is alert and oriented x3.   ASSESSMENT:  Rectal bleeding.   PLAN:  Schedule colonoscopy to investigate.  It might be hemorrhoids,  but he has had a recurrent problem and, given 5 years since the last  colonoscopy, it is reasonable to exclude colorectal neoplasia as the  cause of his bleeding.     Iva Boop, MD,FACG  Electronically Signed    CEG/MedQ  DD: 07/31/2007  DT: 08/01/2007  Job #: [id number]

## 2011-02-25 ENCOUNTER — Ambulatory Visit (HOSPITAL_COMMUNITY): Payer: Medicare Other | Attending: Cardiology | Admitting: Radiology

## 2011-02-25 DIAGNOSIS — I428 Other cardiomyopathies: Secondary | ICD-10-CM

## 2011-02-25 DIAGNOSIS — I079 Rheumatic tricuspid valve disease, unspecified: Secondary | ICD-10-CM | POA: Insufficient documentation

## 2011-02-25 DIAGNOSIS — I420 Dilated cardiomyopathy: Secondary | ICD-10-CM

## 2011-02-25 DIAGNOSIS — E785 Hyperlipidemia, unspecified: Secondary | ICD-10-CM | POA: Insufficient documentation

## 2011-02-25 DIAGNOSIS — I10 Essential (primary) hypertension: Secondary | ICD-10-CM | POA: Insufficient documentation

## 2011-02-25 DIAGNOSIS — I059 Rheumatic mitral valve disease, unspecified: Secondary | ICD-10-CM | POA: Insufficient documentation

## 2011-02-26 ENCOUNTER — Telehealth: Payer: Self-pay | Admitting: *Deleted

## 2011-02-26 NOTE — Telephone Encounter (Signed)
Message copied by Murrell Redden on Fri Feb 26, 2011 10:22 AM ------      Message from: [place], PETER      Created: Fri Feb 26, 2011  8:51 AM       Excellent results on Echo. EF is still good. Please report.

## 2011-02-26 NOTE — Telephone Encounter (Signed)
Lm w/ results of Echo.

## 2011-02-26 NOTE — Cardiovascular Report (Signed)
[place]  Patient:    Nicholas Wilkerson, Nicholas Wilkerson Visit Number: [id number] MRN: [id number]          Service Type: CAT Location: [facility] [id number] Attending Physician:  [place], Peter Manning Dictated by:   [name]. [place], M.D. Proc. Date: 10/17/01 Admit Date:  10/16/2001 Discharge Date: 10/16/2001   CC:         Leroy Sea., M.D.   Cardiac Catheterization  PROCEDURES: Right and left heart catheterization, coronary angiography.  INDICATIONS FOR PROCEDURE: The patient is a 70 year old male, presents with sx of dyspnea. Echocardiographic findings show dilated cardiomyopathy with severe left ventricular dysfunction.  ACCESS: Via the right femoral artery and vein using the standard Seldinger technique.  EQUIPMENT: The 6 French 4 cm right and left Judkins catheter, 6 French pigtail catheter, 6 French arterial sheath, 8 French venous sheath, 7 French balloon-tip Swan-Ganz catheter.  MEDICATIONS: Local anesthesia with 1% Xylocaine.  CONTRAST: Omnipaque 110.  HEMODYNAMIC DATA: Right atrial pressure is 13/11 with a mean of 10 mmHg. Right ventricular pressure is 54 with an EDP of 13 mmHg. Pulmonary artery pressures 53/26 with a mean of 39 mmHg. Pulmonary capillary wedge pressure is 28/28 with a mean of 24 mmHg. Aortic pressure is 124/78 and a mean of 99 mmHg. Left ventricular pressure is 131 with an EDP of 24 mmHg. There is no significant mitral or aortic valve gradient. Cardiac output by thermodilution is 4.5 L/min. with an index of 2.29. Cardiac output by Fick is 5.7 L/min. with an index of 2.91. There is no evidence of shunt.  ANGIOGRAPHIC DATA:  LEFT VENTRICULAR ANGIOGRAPHY: Left ventricular angiography was performed in the RAO view. This demonstrates moderate left ventricular enlargement. There is severe global hypokinesia with overall severely depressed LV function. Ejection fraction is estimated at 30%. There is mild mitral insufficiency  of 1+. The aortic valve appears normal.  The left coronary artery arises and distributes normally.  The left main coronary artery is normal.  The left anterior descending artery has minor wall irregularities of less than 10%.  The left circumflex coronary artery has mild plaque in the mid vessel and terminal marginal branch that appears to be approximately 20%.  The right coronary artery is a large dominant vessel which has only minor wall irregularities, less than 10%.  FINAL INTERPRETATION: 1. Minimal nonobstructive atherosclerotic coronary artery disease. 2. Severe left ventricular dysfunction with global hypokinesia consistent    with dilated nonischemic cardiomyopathy. 3. Moderate pulmonary hypertension. Dictated by:   [name]. [place], M.D. Attending Physician:  [place], Peter Manning DD:  10/17/01 TD:  10/17/01 Job: [id number]

## 2011-08-11 ENCOUNTER — Other Ambulatory Visit (INDEPENDENT_AMBULATORY_CARE_PROVIDER_SITE_OTHER): Payer: Medicare Other

## 2011-08-11 DIAGNOSIS — Z125 Encounter for screening for malignant neoplasm of prostate: Secondary | ICD-10-CM

## 2011-08-11 DIAGNOSIS — Z0000 Encounter for general adult medical examination without abnormal findings: Secondary | ICD-10-CM

## 2011-08-11 DIAGNOSIS — Z79899 Other long term (current) drug therapy: Secondary | ICD-10-CM

## 2011-08-11 DIAGNOSIS — Z23 Encounter for immunization: Secondary | ICD-10-CM

## 2011-08-11 LAB — BASIC METABOLIC PANEL
BUN: 24 mg/dL — ABNORMAL HIGH (ref 6–23)
GFR: 65.26 mL/min (ref >60.00)
Potassium: 4.8 mEq/L (ref 3.5–5.1)
Sodium: 143 mEq/L (ref 135–145)

## 2011-08-11 LAB — PSA: PSA: 1.94 ng/mL (ref 0.10–4.00)

## 2011-08-11 LAB — POCT URINALYSIS DIPSTICK
Glucose, UA: NEGATIVE
Leukocytes, UA: NEGATIVE
Nitrite, UA: NEGATIVE
Spec Grav, UA: 1.02
Urobilinogen, UA: 0.2

## 2011-08-11 LAB — CBC WITH DIFFERENTIAL/PLATELET
Eosinophils Relative: 1.5 % (ref 0.0–5.0)
HCT: 46.5 % (ref 39.0–52.0)
Hemoglobin: 15.4 g/dL (ref 13.0–17.0)
Lymphs Abs: 1.8 10*3/uL (ref 0.7–4.0)
Monocytes Relative: 8.8 % (ref 3.0–12.0)
Platelets: 226 10*3/uL (ref 150.0–400.0)
WBC: 7.1 10*3/uL (ref 4.5–10.5)

## 2011-08-11 LAB — HEPATIC FUNCTION PANEL
ALT: 43 U/L (ref 0–53)
AST: 33 U/L (ref 0–37)
Total Bilirubin: 0.3 mg/dL (ref 0.3–1.2)

## 2011-08-11 LAB — LIPID PANEL
Cholesterol: 124 mg/dL (ref 0–200)
LDL Cholesterol: 62 mg/dL (ref 0–99)
Total CHOL/HDL Ratio: 3

## 2011-08-11 LAB — TSH: TSH: 1.15 u[IU]/mL (ref 0.35–5.50)

## 2011-08-13 NOTE — Progress Notes (Signed)
Quick Note:  Left a message for pt to return call. ______ 

## 2011-08-18 NOTE — Progress Notes (Signed)
Quick Note:  Left voice message with normal results. ______ 

## 2011-08-19 ENCOUNTER — Encounter: Payer: Medicare Other | Admitting: Family Medicine

## 2011-08-30 ENCOUNTER — Encounter: Payer: Self-pay | Admitting: Cardiology

## 2011-08-30 ENCOUNTER — Ambulatory Visit (INDEPENDENT_AMBULATORY_CARE_PROVIDER_SITE_OTHER): Payer: Medicare Other | Admitting: Cardiology

## 2011-08-30 VITALS — BP 122/68 | HR 66 | Ht 69.0 in | Wt 192.0 lb

## 2011-08-30 DIAGNOSIS — I420 Dilated cardiomyopathy: Secondary | ICD-10-CM

## 2011-08-30 DIAGNOSIS — I10 Essential (primary) hypertension: Secondary | ICD-10-CM

## 2011-08-30 DIAGNOSIS — I428 Other cardiomyopathies: Secondary | ICD-10-CM

## 2011-08-30 DIAGNOSIS — E785 Hyperlipidemia, unspecified: Secondary | ICD-10-CM

## 2011-08-30 NOTE — Patient Instructions (Addendum)
Continue your current medications.  I will see you again in 6 months.  Reduce your lasix (furosemide) to 20 mg a day

## 2011-08-30 NOTE — Assessment & Plan Note (Signed)
Recent lipid panel was excellent. He is now seeing Dr. Clent Ridges for his primary care.

## 2011-08-30 NOTE — Assessment & Plan Note (Signed)
No new clinical symptoms. His echocardiogram showed an improvement in his ejection fraction to 50-55%. I recommended reducing his Lasix to 20 mg per day.

## 2011-08-30 NOTE — Progress Notes (Signed)
Nicholas Wilkerson Date of Birth: 01-25-1941   History of Present Illness: Nicholas Wilkerson is seen for followup. He states he has been doing very well. He remains very active. He has had no shortness of breath, PND, or palpitations. He's had no chest pain. Echo in May showed improvement in EF to 50-55%.  Current Outpatient Prescriptions on File Prior to Visit  Medication Sig Dispense Refill  . ASPIRIN PO Take 81 mg by mouth daily.       . carvedilol (COREG) 25 MG tablet Take 1 tablet (25 mg total) by mouth 2 (two) times daily with a meal.  180 tablet  3  . diclofenac (VOLTAREN) 75 MG EC tablet Take 75 mg by mouth daily.        . furosemide (LASIX) 40 MG tablet Take 1 tablet (40 mg total) by mouth daily.  90 tablet  3  . multivitamin (THERAGRAN) per tablet Take 1 tablet by mouth daily.        . potassium chloride (K-DUR,KLOR-CON) 10 MEQ tablet Take 1 tablet (10 mEq total) by mouth daily.  90 tablet  3  . PROCTOZONE-HC 2.5 % rectal cream as needed.      . quinapril (ACCUPRIL) 20 MG tablet Take 1 tablet (20 mg total) by mouth at bedtime.  90 tablet  3  . simvastatin (ZOCOR) 40 MG tablet Take 1 tablet (40 mg total) by mouth at bedtime.  90 tablet  3    No Known Allergies  Past Medical History  Diagnosis Date  . Hypertension   . Dilated cardiomyopathy   . Hypercholesterolemia   . DIVERTICULOSIS, COLON 12/08/2007  . ARTHRITIS 10/22/2008  . History of echocardiogram 05/22/2007    EF was 45-50% / Mild concentric LV hypertrophy with mild global hypokinesis and overall mild systolic dysfunction .  Mild AV sclerosis / Mild Mitral insufficiency / compared to prior study 04/24/02 -- LV function has improved further.    . CHF (congestive heart failure)     Past Surgical History  Procedure Date  . Cardiac catheterization 10/17/2001    EF estimated at 30% / moderate LV enlargement  / 1. Minimal nonobstructive atherosclerotic coronary artery disease / 2. Severe LV dysfunction with global hypokinesia  consistent with dilated nonischemic cardiomyopath / 3. Moderate pulmonary hypertension    History  Smoking status  . Never Smoker   Smokeless tobacco  . Never Used    History  Alcohol Use No    Family History  Problem Relation Age of Onset  . Stroke Father   . Angina Mother     Review of Systems:  All other systems were reviewed and are negative.  Physical Exam: BP 122/68  Pulse 66  Ht 5\' 9"  (1.753 m)  Wt 192 lb (87.091 kg)  BMI 28.35 kg/m2 He is a pleasant white male in no acute distress. His HEENT exam is unremarkable. No JVD or bruits. Lungs are clear. Cardiac exam reveals a regular rate and rhythm without gallop or murmur. He has no edema. Pedal pulses are good. LABORATORY DATA: Lab Results  Component Value Date   WBC 7.1 08/11/2011   HGB 15.4 08/11/2011   HCT 46.5 08/11/2011   PLT 226.0 08/11/2011   GLUCOSE 107* 08/11/2011   CHOL 124 08/11/2011   TRIG 133.0 08/11/2011   HDL 35.80* 08/11/2011   LDLCALC 62 08/11/2011   ALT 43 08/11/2011   AST 33 08/11/2011   NA 143 08/11/2011   K 4.8 08/11/2011   CL 107  08/11/2011   CREATININE 1.2 08/11/2011   BUN 24* 08/11/2011   CO2 28 08/11/2011   TSH 1.15 08/11/2011   PSA 1.94 08/11/2011     Assessment / Plan:

## 2011-08-30 NOTE — Assessment & Plan Note (Signed)
Blood pressure is under excellent control. We will continue with his current therapy.

## 2011-09-08 ENCOUNTER — Encounter: Payer: Self-pay | Admitting: Family Medicine

## 2011-09-08 ENCOUNTER — Ambulatory Visit (INDEPENDENT_AMBULATORY_CARE_PROVIDER_SITE_OTHER): Payer: Medicare Other | Admitting: Family Medicine

## 2011-09-08 VITALS — BP 118/78 | HR 60 | Temp 98.6°F | Ht 68.75 in | Wt 183.0 lb

## 2011-09-08 DIAGNOSIS — Z0000 Encounter for general adult medical examination without abnormal findings: Secondary | ICD-10-CM

## 2011-09-08 MED ORDER — TADALAFIL 10 MG PO TABS: 10.0000 mg | ORAL_TABLET | ORAL | Status: DC | PRN

## 2011-09-08 NOTE — Progress Notes (Signed)
  Subjective:    Patient ID: Nicholas Wilkerson, male    DOB: 10-04-1941, 70 y.o.   MRN: [id number]  HPI 70 yr old male for a cpx. He feels well and has no concerns. He sees Dr. [place] frequently.    Review of Systems  Constitutional: Negative.   HENT: Negative.   Eyes: Negative.   Respiratory: Negative.   Cardiovascular: Negative.   Gastrointestinal: Negative.   Genitourinary: Negative.   Musculoskeletal: Negative.   Skin: Negative.   Neurological: Negative.   Hematological: Negative.   Psychiatric/Behavioral: Negative.        Objective:   Physical Exam  Constitutional: He is oriented to person, place, and time. He appears well-developed and well-nourished. No distress.  HENT:  Head: Normocephalic and atraumatic.  Right Ear: External ear normal.  Left Ear: External ear normal.  Nose: Nose normal.  Mouth/Throat: Oropharynx is clear and moist. No oropharyngeal exudate.  Eyes: Conjunctivae and EOM are normal. Pupils are equal, round, and reactive to light. Right eye exhibits no discharge. Left eye exhibits no discharge. No scleral icterus.  Neck: Neck supple. No JVD present. No tracheal deviation present. No thyromegaly present.  Cardiovascular: Normal rate, regular rhythm, normal heart sounds and intact distal pulses.  Exam reveals no gallop and no friction rub.   No murmur heard.      EKG normal with frequent PVCs  Pulmonary/Chest: Effort normal and breath sounds normal. No respiratory distress. He has no wheezes. He has no rales. He exhibits no tenderness.  Abdominal: Soft. Bowel sounds are normal. He exhibits no distension and no mass. There is no tenderness. There is no rebound and no guarding.  Genitourinary: Rectum normal, prostate normal and penis normal. Guaiac negative stool. No penile tenderness.  Musculoskeletal: Normal range of motion. He exhibits no edema and no tenderness.  Lymphadenopathy:    He has no cervical adenopathy.  Neurological: He is alert and oriented  to person, place, and time. He has normal reflexes. No cranial nerve deficit. He exhibits normal muscle tone. Coordination normal.  Skin: Skin is warm and dry. No rash noted. He is not diaphoretic. No erythema. No pallor.  Psychiatric: He has a normal mood and affect. His behavior is normal. Judgment and thought content normal.          Assessment & Plan:  Well exam. Get more exercise.

## 2011-11-01 ENCOUNTER — Telehealth: Payer: Self-pay | Admitting: Family Medicine

## 2011-11-01 MED ORDER — DICLOFENAC SODIUM 75 MG PO TBEC: 75.0000 mg | DELAYED_RELEASE_TABLET | Freq: Two times a day (BID) | ORAL | Status: DC

## 2011-11-01 NOTE — Telephone Encounter (Signed)
Script sent e-scribe 

## 2011-11-01 NOTE — Telephone Encounter (Signed)
Refill request from [facility] pharmacy for Diclofenac.

## 2011-12-21 ENCOUNTER — Other Ambulatory Visit: Payer: Self-pay | Admitting: Family Medicine

## 2011-12-21 NOTE — Telephone Encounter (Signed)
Pt needs all maintenance medication fax [facility] [phone]. Pt has new mail order pharm. Pt needs 90 days rx's with 3 refills.

## 2011-12-23 MED ORDER — FUROSEMIDE 40 MG PO TABS: 40.0000 mg | ORAL_TABLET | Freq: Every day | ORAL | Status: DC

## 2011-12-23 MED ORDER — CARVEDILOL 25 MG PO TABS: 25.0000 mg | ORAL_TABLET | Freq: Two times a day (BID) | ORAL | Status: DC

## 2011-12-23 MED ORDER — POTASSIUM CHLORIDE CRYS ER 10 MEQ PO TBCR: 10.0000 meq | EXTENDED_RELEASE_TABLET | Freq: Every day | ORAL | Status: DC

## 2011-12-23 MED ORDER — SIMVASTATIN 40 MG PO TABS: 40.0000 mg | ORAL_TABLET | Freq: Every day | ORAL | Status: DC

## 2011-12-23 MED ORDER — QUINAPRIL HCL 20 MG PO TABS: 20.0000 mg | ORAL_TABLET | Freq: Every day | ORAL | Status: DC

## 2011-12-23 MED ORDER — TADALAFIL 10 MG PO TABS: 10.0000 mg | ORAL_TABLET | ORAL | Status: DC | PRN

## 2011-12-23 NOTE — Telephone Encounter (Signed)
All scripts were sent e-scribe

## 2011-12-23 NOTE — Telephone Encounter (Signed)
Scripts for Coreg, Lasix, K-Dur, Accupril, & Zocor send to mail order. Script for Cialis send to [facility] and pt only wants # 8 of this.

## 2012-01-03 ENCOUNTER — Telehealth: Payer: Self-pay | Admitting: Family Medicine

## 2012-01-03 NOTE — Telephone Encounter (Signed)
[facility] pharmacy need to get verbal clarification on instructions for Furosemide.

## 2012-01-04 MED ORDER — FUROSEMIDE 40 MG PO TABS: 40.0000 mg | ORAL_TABLET | Freq: Every day | ORAL | Status: DC

## 2012-01-04 NOTE — Telephone Encounter (Signed)
Spoke with pt and resent script e-scribe.

## 2012-01-06 ENCOUNTER — Telehealth: Payer: Self-pay | Admitting: Family Medicine

## 2012-01-06 MED ORDER — FUROSEMIDE 40 MG PO TABS: 20.0000 mg | ORAL_TABLET | Freq: Every day | ORAL | Status: DC

## 2012-01-06 NOTE — Telephone Encounter (Signed)
Resent script e-scribe.

## 2012-02-15 ENCOUNTER — Telehealth: Payer: Self-pay | Admitting: Family Medicine

## 2012-02-15 NOTE — Telephone Encounter (Signed)
Refill request for Proctozone-HC 2.5%.

## 2012-02-17 MED ORDER — HYDROCORTISONE 2.5 % RE CREA: TOPICAL_CREAM | RECTAL | Status: DC | PRN

## 2012-02-17 NOTE — Telephone Encounter (Signed)
Okay with plenty of refills

## 2012-02-17 NOTE — Telephone Encounter (Signed)
Script sent e-scribe 

## 2012-03-13 ENCOUNTER — Ambulatory Visit (INDEPENDENT_AMBULATORY_CARE_PROVIDER_SITE_OTHER): Payer: Medicare Other | Admitting: Cardiology

## 2012-03-13 ENCOUNTER — Encounter: Payer: Self-pay | Admitting: Cardiology

## 2012-03-13 VITALS — BP 139/89 | HR 75 | Ht 68.75 in | Wt 193.0 lb

## 2012-03-13 DIAGNOSIS — I428 Other cardiomyopathies: Secondary | ICD-10-CM

## 2012-03-13 DIAGNOSIS — I420 Dilated cardiomyopathy: Secondary | ICD-10-CM

## 2012-03-13 DIAGNOSIS — I10 Essential (primary) hypertension: Secondary | ICD-10-CM

## 2012-03-13 DIAGNOSIS — Z8679 Personal history of other diseases of the circulatory system: Secondary | ICD-10-CM

## 2012-03-13 NOTE — Assessment & Plan Note (Signed)
Blood pressure is under excellent control. 

## 2012-03-13 NOTE — Progress Notes (Signed)
   Nicholas Wilkerson Date of Birth: 1941-06-05   History of Present Illness: Nicholas Wilkerson is seen for followup. He states he has been doing very well. He remains very active. He has had no shortness of breath, PND, or palpitations. He's had no chest pain. On his last visit we reduced his Lasix dose he is noted no increased shortness of breath or edema. The only thing he noticed was a reduction in frequency of urination.  Current Outpatient Prescriptions on File Prior to Visit  Medication Sig Dispense Refill  . ASPIRIN PO Take 81 mg by mouth daily.       . carvedilol (COREG) 25 MG tablet Take 1 tablet (25 mg total) by mouth 2 (two) times daily with a meal.  180 tablet  2  . diclofenac (VOLTAREN) 75 MG EC tablet Take 1 tablet (75 mg total) by mouth 2 (two) times daily.  180 tablet  3  . hydrocortisone (PROCTOZONE-HC) 2.5 % rectal cream Place rectally as needed.  30 g  11  . multivitamin (THERAGRAN) per tablet Take 1 tablet by mouth daily.        . quinapril (ACCUPRIL) 20 MG tablet Take 1 tablet (20 mg total) by mouth at bedtime.  90 tablet  2  . simvastatin (ZOCOR) 40 MG tablet Take 1 tablet (40 mg total) by mouth at bedtime.  90 tablet  2  . tadalafil (CIALIS) 10 MG tablet Take 1 tablet (10 mg total) by mouth as needed for erectile dysfunction.  8 tablet  1    No Known Allergies  Past Medical History  Diagnosis Date  . Hypertension   . Dilated cardiomyopathy   . Hypercholesterolemia   . DIVERTICULOSIS, COLON 12/08/2007  . ARTHRITIS 10/22/2008  . History of echocardiogram 05/22/2007    EF was 45-50% / Mild concentric LV hypertrophy with mild global hypokinesis and overall mild systolic dysfunction .  Mild AV sclerosis / Mild Mitral insufficiency / compared to prior study 04/24/02 -- LV function has improved further.    . CHF (congestive heart failure)     sees Dr. Mazal Ebey [place]     Past Surgical History  Procedure Date  . Cardiac catheterization 10/17/2001    EF estimated at 30% /  moderate LV enlargement  / 1. Minimal nonobstructive atherosclerotic coronary artery disease / 2. Severe LV dysfunction with global hypokinesia consistent with dilated nonischemic cardiomyopath / 3. Moderate pulmonary hypertension  . Colonoscopy 08-15-07    per Dr. Leone Payor, clear , repeat in 10 yrs    History  Smoking status  . Never Smoker   Smokeless tobacco  . Never Used    History  Alcohol Use  . 0.5 oz/week  . 1 drink(s) per week    Family History  Problem Relation Age of Onset  . Stroke Father   . Angina Mother     Review of Systems:  All other systems were reviewed and are negative.  Physical Exam: BP 139/89  Pulse 75  Ht 5' 8.75" (1.746 m)  Wt 193 lb (87.544 kg)  BMI 28.71 kg/m2 He is a pleasant white male in no acute distress. His HEENT exam is unremarkable. No JVD or bruits. Lungs are clear. Cardiac exam reveals a regular rate and rhythm without gallop or murmur. He has no edema. Pedal pulses are good. LABORATORY DATA:   Assessment / Plan:

## 2012-03-13 NOTE — Patient Instructions (Signed)
Stop Lasix and potassium  Continue your other therapy.  I will see you again in 6 months.

## 2012-03-13 NOTE — Assessment & Plan Note (Signed)
His ejection fraction has recovered nicely to 50-55%. He is asymptomatic. We will go ahead and stop his Lasix completely along with his potassium. He will remain on carvedilol and quinapril. I'll followup again in 6 months.

## 2012-08-31 ENCOUNTER — Other Ambulatory Visit (INDEPENDENT_AMBULATORY_CARE_PROVIDER_SITE_OTHER): Payer: Medicare Other

## 2012-08-31 ENCOUNTER — Ambulatory Visit (INDEPENDENT_AMBULATORY_CARE_PROVIDER_SITE_OTHER): Payer: Medicare Other | Admitting: Family Medicine

## 2012-08-31 DIAGNOSIS — Z125 Encounter for screening for malignant neoplasm of prostate: Secondary | ICD-10-CM

## 2012-08-31 DIAGNOSIS — Z23 Encounter for immunization: Secondary | ICD-10-CM

## 2012-08-31 DIAGNOSIS — I10 Essential (primary) hypertension: Secondary | ICD-10-CM

## 2012-08-31 DIAGNOSIS — Z0000 Encounter for general adult medical examination without abnormal findings: Secondary | ICD-10-CM

## 2012-08-31 LAB — CBC WITH DIFFERENTIAL/PLATELET
Basophils Absolute: 0.1 10*3/uL (ref 0.0–0.1)
HCT: 50.8 % (ref 39.0–52.0)
Hemoglobin: 16.6 g/dL (ref 13.0–17.0)
Lymphs Abs: 1.8 10*3/uL (ref 0.7–4.0)
MCHC: 32.7 g/dL (ref 30.0–36.0)
MCV: 90.1 fl (ref 78.0–100.0)
Monocytes Relative: 8.5 % (ref 3.0–12.0)
Neutro Abs: 5.9 10*3/uL (ref 1.4–7.7)
RDW: 14.5 % (ref 11.5–14.6)

## 2012-08-31 LAB — HEPATIC FUNCTION PANEL
AST: 25 U/L (ref 0–37)
Albumin: 3.8 g/dL (ref 3.5–5.2)

## 2012-08-31 LAB — LIPID PANEL
Cholesterol: 114 mg/dL (ref 0–200)
LDL Cholesterol: 63 mg/dL (ref 0–99)
Total CHOL/HDL Ratio: 4

## 2012-08-31 LAB — POCT URINALYSIS DIPSTICK
Protein, UA: NEGATIVE
Spec Grav, UA: 1.02
Urobilinogen, UA: 0.2

## 2012-08-31 LAB — TSH: TSH: 1.05 u[IU]/mL (ref 0.35–5.50)

## 2012-08-31 LAB — BASIC METABOLIC PANEL
CO2: 29 mEq/L (ref 19–32)
Chloride: 107 mEq/L (ref 96–112)
Glucose, Bld: 96 mg/dL (ref 70–99)
Potassium: 5.1 mEq/L (ref 3.5–5.1)
Sodium: 142 mEq/L (ref 135–145)

## 2012-09-01 NOTE — Progress Notes (Signed)
Quick Note:  Pt has a CPE on 09/11/12 and will go over then. ______

## 2012-09-11 ENCOUNTER — Encounter: Payer: Self-pay | Admitting: Family Medicine

## 2012-09-11 ENCOUNTER — Ambulatory Visit (INDEPENDENT_AMBULATORY_CARE_PROVIDER_SITE_OTHER): Payer: Medicare Other | Admitting: Family Medicine

## 2012-09-11 VITALS — BP 118/70 | HR 87 | Temp 98.4°F | Ht 69.0 in | Wt 190.0 lb

## 2012-09-11 DIAGNOSIS — Z0000 Encounter for general adult medical examination without abnormal findings: Secondary | ICD-10-CM

## 2012-09-11 MED ORDER — CARVEDILOL 25 MG PO TABS: 25.0000 mg | ORAL_TABLET | Freq: Two times a day (BID) | ORAL | Status: DC

## 2012-09-11 MED ORDER — HYDROCORTISONE 2.5 % RE CREA: TOPICAL_CREAM | RECTAL | Status: DC | PRN

## 2012-09-11 MED ORDER — SIMVASTATIN 40 MG PO TABS: 40.0000 mg | ORAL_TABLET | Freq: Every day | ORAL | Status: DC

## 2012-09-11 MED ORDER — DICLOFENAC SODIUM 75 MG PO TBEC: 75.0000 mg | DELAYED_RELEASE_TABLET | Freq: Two times a day (BID) | ORAL | Status: DC

## 2012-09-11 MED ORDER — QUINAPRIL HCL 20 MG PO TABS: 20.0000 mg | ORAL_TABLET | Freq: Every day | ORAL | Status: DC

## 2012-09-11 NOTE — Addendum Note (Signed)
Addended by: Aniceto Boss A on: 09/11/2012 03:34 PM   Modules accepted: Orders

## 2012-09-11 NOTE — Progress Notes (Signed)
  Subjective:    Patient ID: Nicholas Wilkerson, male    DOB: [birth year], 71 y.o.   MRN: [id number]  HPI 71 yr old male for a cpx. He feels well and has no concerns. He remains active.    Review of Systems  Constitutional: Negative.   HENT: Negative.   Eyes: Negative.   Respiratory: Negative.   Cardiovascular: Negative.   Gastrointestinal: Negative.   Genitourinary: Negative.   Musculoskeletal: Negative.   Skin: Negative.   Neurological: Negative.   Hematological: Negative.   Psychiatric/Behavioral: Negative.        Objective:   Physical Exam  Constitutional: He is oriented to person, place, and time. He appears well-developed and well-nourished. No distress.  HENT:  Head: Normocephalic and atraumatic.  Right Ear: External ear normal.  Left Ear: External ear normal.  Nose: Nose normal.  Mouth/Throat: Oropharynx is clear and moist. No oropharyngeal exudate.  Eyes: Conjunctivae normal and EOM are normal. Pupils are equal, round, and reactive to light. Right eye exhibits no discharge. Left eye exhibits no discharge. No scleral icterus.  Neck: Neck supple. No JVD present. No tracheal deviation present. No thyromegaly present.  Cardiovascular: Normal rate, normal heart sounds and intact distal pulses.  Exam reveals no gallop and no friction rub.   No murmur heard.      Rhythm is regular with occasional ectopy   Pulmonary/Chest: Effort normal and breath sounds normal. No respiratory distress. He has no wheezes. He has no rales. He exhibits no tenderness.  Abdominal: Soft. Bowel sounds are normal. He exhibits no distension and no mass. There is no tenderness. There is no rebound and no guarding.  Genitourinary: Rectum normal, prostate normal and penis normal. Guaiac negative stool. No penile tenderness.  Musculoskeletal: Normal range of motion. He exhibits no edema and no tenderness.  Lymphadenopathy:    He has no cervical adenopathy.  Neurological: He is alert and oriented to  person, place, and time. He has normal reflexes. No cranial nerve deficit. He exhibits normal muscle tone. Coordination normal.  Skin: Skin is warm and dry. No rash noted. He is not diaphoretic. No erythema. No pallor.  Psychiatric: He has a normal mood and affect. His behavior is normal. Judgment and thought content normal.          Assessment & Plan:  Well exam. He will see Dr. [place] next week

## 2012-09-19 ENCOUNTER — Ambulatory Visit (INDEPENDENT_AMBULATORY_CARE_PROVIDER_SITE_OTHER): Payer: Medicare Other | Admitting: Cardiology

## 2012-09-19 ENCOUNTER — Encounter: Payer: Self-pay | Admitting: Cardiology

## 2012-09-19 VITALS — BP 128/78 | HR 73 | Ht 69.0 in | Wt 191.4 lb

## 2012-09-19 DIAGNOSIS — E785 Hyperlipidemia, unspecified: Secondary | ICD-10-CM

## 2012-09-19 DIAGNOSIS — I428 Other cardiomyopathies: Secondary | ICD-10-CM

## 2012-09-19 DIAGNOSIS — I10 Essential (primary) hypertension: Secondary | ICD-10-CM

## 2012-09-19 NOTE — Patient Instructions (Signed)
Continue your current medication  I will see you in 6 months.   

## 2012-09-19 NOTE — Progress Notes (Signed)
Nicholas Wilkerson Date of Birth: 30-Jun-1941   History of Present Illness: Nicholas Wilkerson is seen for followup. He states he has been doing very well. He remains very active. He has had no shortness of breath, PND, or palpitations. He's had no chest pain. We stopped his Lasix on his last visit and he has noticed no increase in edema.  Current Outpatient Prescriptions on File Prior to Visit  Medication Sig Dispense Refill  . ASPIRIN PO Take 81 mg by mouth daily.       . carvedilol (COREG) 25 MG tablet Take 1 tablet (25 mg total) by mouth 2 (two) times daily with a meal.  180 tablet  3  . diclofenac (VOLTAREN) 75 MG EC tablet Take 1 tablet (75 mg total) by mouth 2 (two) times daily.  180 tablet  3  . hydrocortisone (PROCTOZONE-HC) 2.5 % rectal cream Place rectally as needed.  90 g  3  . multivitamin (THERAGRAN) per tablet Take 1 tablet by mouth daily.        . quinapril (ACCUPRIL) 20 MG tablet Take 1 tablet (20 mg total) by mouth at bedtime.  90 tablet  3  . simvastatin (ZOCOR) 40 MG tablet Take 1 tablet (40 mg total) by mouth at bedtime.  90 tablet  3  . tadalafil (CIALIS) 10 MG tablet Take 1 tablet (10 mg total) by mouth as needed for erectile dysfunction.  8 tablet  1    No Known Allergies  Past Medical History  Diagnosis Date  . Hypertension   . Dilated cardiomyopathy   . Hypercholesterolemia   . DIVERTICULOSIS, COLON 12/08/2007  . ARTHRITIS 10/22/2008  . History of echocardiogram 05/22/2007    EF was 45-50% / Mild concentric LV hypertrophy with mild global hypokinesis and overall mild systolic dysfunction .  Mild AV sclerosis / Mild Mitral insufficiency / compared to prior study 04/24/02 -- LV function has improved further.    . CHF (congestive heart failure)     sees Dr. Lakresha Stifter [place]     Past Surgical History  Procedure Date  . Cardiac catheterization 10/17/2001    EF estimated at 30% / moderate LV enlargement  / 1. Minimal nonobstructive atherosclerotic coronary artery disease / 2.  Severe LV dysfunction with global hypokinesia consistent with dilated nonischemic cardiomyopath / 3. Moderate pulmonary hypertension  . Colonoscopy 08-15-07    per Dr. Leone Payor, clear , repeat in 10 yrs    History  Smoking status  . Never Smoker   Smokeless tobacco  . Never Used    History  Alcohol Use  . 0.5 oz/week  . 1 drink(s) per week    Family History  Problem Relation Age of Onset  . Stroke Father   . Angina Mother     Review of Systems:  All other systems were reviewed and are negative.  Physical Exam: BP 128/78  Pulse 73  Ht 5\' 9"  (1.753 m)  Wt 191 lb 6.4 oz (86.818 kg)  BMI 28.26 kg/m2 He is a pleasant white male in no acute distress. His HEENT exam is unremarkable. No JVD or bruits. Lungs are clear. Cardiac exam reveals a regular rate and rhythm without gallop or murmur. He has no edema. Pedal pulses are good. LABORATORY DATA: Laboratory data from 08/31/2012 was reviewed. CBC, chemistries, TSH, and PSA roll normal. Lipids were excellent except for low HDL. ECG today demonstrates normal sinus rhythm with occasional unifocal PVCs. It is otherwise normal.  Assessment / Plan:  1. Dilated cardiomyopathy with  recovery of LV function to 50-55%. We will continue long-term carvedilol and quinapril.  2. Hypertension well controlled.  3. PVCs, asymptomatic. Continue carvedilol.

## 2013-05-09 ENCOUNTER — Telehealth: Payer: Self-pay | Admitting: Cardiology

## 2013-05-09 NOTE — Telephone Encounter (Signed)
Follow Up ° ° ° °Pt returning your call from earlier. Please call back. °

## 2013-05-11 NOTE — Telephone Encounter (Signed)
Returned call to patient 05/09/13 appointment scheduled with Dr.Jordan 07/12/13.

## 2013-05-18 ENCOUNTER — Ambulatory Visit: Payer: Medicare Other | Admitting: Cardiology

## 2013-07-04 ENCOUNTER — Encounter: Payer: Self-pay | Admitting: Cardiology

## 2013-07-04 ENCOUNTER — Encounter: Payer: Self-pay | Admitting: Cardiovascular Disease

## 2013-07-12 ENCOUNTER — Ambulatory Visit (INDEPENDENT_AMBULATORY_CARE_PROVIDER_SITE_OTHER): Payer: Medicare Other | Admitting: Cardiology

## 2013-07-12 ENCOUNTER — Encounter: Payer: Self-pay | Admitting: Cardiology

## 2013-07-12 VITALS — BP 136/88 | HR 82 | Ht 69.0 in | Wt 184.0 lb

## 2013-07-12 DIAGNOSIS — I428 Other cardiomyopathies: Secondary | ICD-10-CM

## 2013-07-12 DIAGNOSIS — I10 Essential (primary) hypertension: Secondary | ICD-10-CM

## 2013-07-12 DIAGNOSIS — I5023 Acute on chronic systolic (congestive) heart failure: Secondary | ICD-10-CM | POA: Insufficient documentation

## 2013-07-12 DIAGNOSIS — I5022 Chronic systolic (congestive) heart failure: Secondary | ICD-10-CM

## 2013-07-12 DIAGNOSIS — E785 Hyperlipidemia, unspecified: Secondary | ICD-10-CM

## 2013-07-12 DIAGNOSIS — I509 Heart failure, unspecified: Secondary | ICD-10-CM

## 2013-07-12 NOTE — Patient Instructions (Addendum)
Continue your current therapy  I will see you in 6 months.   

## 2013-07-12 NOTE — Progress Notes (Signed)
Nicholas Wilkerson Date of Birth: Sep 10, 1941   History of Present Illness: Nicholas Wilkerson is seen for followup. He has a history of dilated cardiomyopathy with initial ejection fraction of 30%. Subsequent evaluation has demonstrated improvement to 50-55%. He states he has been doing very well. He remains very active. He has had no shortness of breath, PND, or palpitations. He's had no chest pain. He has not required diuretic therapy.  Current Outpatient Prescriptions on File Prior to Visit  Medication Sig Dispense Refill  . ASPIRIN PO Take 81 mg by mouth daily.       . carvedilol (COREG) 25 MG tablet Take 1 tablet (25 mg total) by mouth 2 (two) times daily with a meal.  180 tablet  3  . diclofenac (VOLTAREN) 75 MG EC tablet Take 1 tablet (75 mg total) by mouth 2 (two) times daily.  180 tablet  3  . hydrocortisone (PROCTOZONE-HC) 2.5 % rectal cream Place rectally as needed.  90 g  3  . multivitamin (THERAGRAN) per tablet Take 1 tablet by mouth daily.        . quinapril (ACCUPRIL) 20 MG tablet Take 1 tablet (20 mg total) by mouth at bedtime.  90 tablet  3  . simvastatin (ZOCOR) 40 MG tablet Take 1 tablet (40 mg total) by mouth at bedtime.  90 tablet  3   No current facility-administered medications on file prior to visit.    No Known Allergies  Past Medical History  Diagnosis Date  . Hypertension   . Dilated cardiomyopathy   . Hypercholesterolemia   . DIVERTICULOSIS, COLON 12/08/2007  . ARTHRITIS 10/22/2008  . History of echocardiogram 05/22/2007    EF was 45-50% / Mild concentric LV hypertrophy with mild global hypokinesis and overall mild systolic dysfunction .  Mild AV sclerosis / Mild Mitral insufficiency / compared to prior study 04/24/02 -- LV function has improved further.    . CHF (congestive heart failure)     sees Dr. Arloa Prak [place]     Past Surgical History  Procedure Laterality Date  . Cardiac catheterization  10/17/2001    EF estimated at 30% / moderate LV enlargement  / 1.  Minimal nonobstructive atherosclerotic coronary artery disease / 2. Severe LV dysfunction with global hypokinesia consistent with dilated nonischemic cardiomyopath / 3. Moderate pulmonary hypertension  . Colonoscopy  08-15-07    per Dr. Leone Payor, clear , repeat in 10 yrs    History  Smoking status  . Never Smoker   Smokeless tobacco  . Never Used    History  Alcohol Use  . 0.5 oz/week  . 1 drink(s) per week    Family History  Problem Relation Age of Onset  . Stroke Father   . Angina Mother     Review of Systems:  All other systems were reviewed and are negative.  Physical Exam: BP 136/88  Pulse 82  Ht 5\' 9"  (1.753 m)  Wt 184 lb (83.462 kg)  BMI 27.16 kg/m2  SpO2 98% He is a pleasant white male in no acute distress. His HEENT exam is unremarkable. No JVD or bruits. Lungs are clear. Cardiac exam reveals a regular rate and rhythm without gallop or murmur. He has no edema. Pedal pulses are good. LABORATORY DATA:  ECG today demonstrates normal sinus rhythm with occasional  PACs. There is LVH.  Assessment / Plan:  1. Dilated cardiomyopathy with recovery of LV function to 50-55%. We will continue long-term carvedilol and quinapril.  2. Hypertension well controlled.  3.  PVCs, asymptomatic. Continue carvedilol.

## 2013-07-31 ENCOUNTER — Ambulatory Visit (INDEPENDENT_AMBULATORY_CARE_PROVIDER_SITE_OTHER): Payer: Medicare Other

## 2013-07-31 DIAGNOSIS — Z23 Encounter for immunization: Secondary | ICD-10-CM

## 2013-09-05 ENCOUNTER — Other Ambulatory Visit (INDEPENDENT_AMBULATORY_CARE_PROVIDER_SITE_OTHER): Payer: Medicare Other

## 2013-09-05 DIAGNOSIS — Z0000 Encounter for general adult medical examination without abnormal findings: Secondary | ICD-10-CM

## 2013-09-05 DIAGNOSIS — E785 Hyperlipidemia, unspecified: Secondary | ICD-10-CM

## 2013-09-05 LAB — CBC WITH DIFFERENTIAL/PLATELET
Basophils Absolute: 0 10*3/uL (ref 0.0–0.1)
Basophils Relative: 0.6 % (ref 0.0–3.0)
Eosinophils Absolute: 0.1 10*3/uL (ref 0.0–0.7)
Eosinophils Relative: 1.9 % (ref 0.0–5.0)
HCT: 42.7 % (ref 39.0–52.0)
Lymphocytes Relative: 25.3 % (ref 12.0–46.0)
MCHC: 32.5 g/dL (ref 30.0–36.0)
Monocytes Relative: 9.7 % (ref 3.0–12.0)
Neutro Abs: 4.1 10*3/uL (ref 1.4–7.7)
Neutrophils Relative %: 62.5 % (ref 43.0–77.0)
RBC: 4.87 Mil/uL (ref 4.22–5.81)
RDW: 13.3 % (ref 11.5–14.6)

## 2013-09-05 LAB — HEPATIC FUNCTION PANEL
AST: 22 U/L (ref 0–37)
Alkaline Phosphatase: 60 U/L (ref 39–117)
Bilirubin, Direct: 0.1 mg/dL (ref 0.0–0.3)
Total Protein: 6.5 g/dL (ref 6.0–8.3)

## 2013-09-05 LAB — POCT URINALYSIS DIPSTICK
Bilirubin, UA: NEGATIVE
Glucose, UA: NEGATIVE
Ketones, UA: NEGATIVE
Leukocytes, UA: NEGATIVE
Nitrite, UA: NEGATIVE
Protein, UA: NEGATIVE
Spec Grav, UA: 1.025
pH, UA: 6.5

## 2013-09-05 LAB — LIPID PANEL
Cholesterol: 123 mg/dL (ref 0–200)
HDL: 31.9 mg/dL — ABNORMAL LOW (ref >39.00)
Total CHOL/HDL Ratio: 4
VLDL: 20.8 mg/dL (ref 0.0–40.0)

## 2013-09-05 LAB — BASIC METABOLIC PANEL
CO2: 29 mEq/L (ref 19–32)
Calcium: 9 mg/dL (ref 8.4–10.5)
Creatinine, Ser: 1.1 mg/dL (ref 0.4–1.5)

## 2013-09-13 ENCOUNTER — Ambulatory Visit (INDEPENDENT_AMBULATORY_CARE_PROVIDER_SITE_OTHER): Payer: Medicare Other | Admitting: Family Medicine

## 2013-09-13 ENCOUNTER — Encounter: Payer: Self-pay | Admitting: Family Medicine

## 2013-09-13 VITALS — BP 130/80 | HR 78 | Temp 98.0°F | Ht 68.0 in | Wt 188.0 lb

## 2013-09-13 DIAGNOSIS — Z0000 Encounter for general adult medical examination without abnormal findings: Secondary | ICD-10-CM

## 2013-09-13 MED ORDER — DICLOFENAC SODIUM 75 MG PO TBEC: 75.0000 mg | DELAYED_RELEASE_TABLET | Freq: Two times a day (BID) | ORAL | Status: DC

## 2013-09-13 MED ORDER — SIMVASTATIN 40 MG PO TABS: 40.0000 mg | ORAL_TABLET | Freq: Every day | ORAL | Status: DC

## 2013-09-13 MED ORDER — CARVEDILOL 25 MG PO TABS: 25.0000 mg | ORAL_TABLET | Freq: Two times a day (BID) | ORAL | Status: DC

## 2013-09-13 MED ORDER — QUINAPRIL HCL 20 MG PO TABS: 20.0000 mg | ORAL_TABLET | Freq: Every day | ORAL | Status: DC

## 2013-09-13 MED ORDER — TADALAFIL 20 MG PO TABS: 20.0000 mg | ORAL_TABLET | Freq: Every day | ORAL | Status: DC | PRN

## 2013-09-13 NOTE — Progress Notes (Signed)
Pre visit review using our clinic review tool, if applicable. No additional management support is needed unless otherwise documented below in the visit note. 

## 2013-09-13 NOTE — Progress Notes (Signed)
   Subjective:    Patient ID: Nicholas Wilkerson, male    DOB: 09-Jun-1941, 72 y.o.   MRN: [id number]  HPI 72 yr old male for a cpx. He feels great. He had a good checkup with Dr. [place] in October.    Review of Systems  Constitutional: Negative.   HENT: Negative.   Eyes: Negative.   Respiratory: Negative.   Cardiovascular: Negative.   Gastrointestinal: Negative.   Genitourinary: Negative.   Musculoskeletal: Negative.   Skin: Negative.   Neurological: Negative.   Psychiatric/Behavioral: Negative.        Objective:   Physical Exam  Constitutional: He is oriented to person, place, and time. He appears well-developed and well-nourished. No distress.  HENT:  Head: Normocephalic and atraumatic.  Right Ear: External ear normal.  Left Ear: External ear normal.  Nose: Nose normal.  Mouth/Throat: Oropharynx is clear and moist. No oropharyngeal exudate.  Eyes: Conjunctivae and EOM are normal. Pupils are equal, round, and reactive to light. Right eye exhibits no discharge. Left eye exhibits no discharge. No scleral icterus.  Neck: Neck supple. No JVD present. No tracheal deviation present. No thyromegaly present.  Cardiovascular: Normal rate, regular rhythm, normal heart sounds and intact distal pulses.  Exam reveals no gallop and no friction rub.   No murmur heard. Pulmonary/Chest: Effort normal and breath sounds normal. No respiratory distress. He has no wheezes. He has no rales. He exhibits no tenderness.  Abdominal: Soft. Bowel sounds are normal. He exhibits no distension and no mass. There is no tenderness. There is no rebound and no guarding.  Genitourinary: Rectum normal, prostate normal and penis normal. Guaiac negative stool. No penile tenderness.  Musculoskeletal: Normal range of motion. He exhibits no edema and no tenderness.  Lymphadenopathy:    He has no cervical adenopathy.  Neurological: He is alert and oriented to person, place, and time. He has normal reflexes. No cranial  nerve deficit. He exhibits normal muscle tone. Coordination normal.  Skin: Skin is warm and dry. No rash noted. He is not diaphoretic. No erythema. No pallor.  Psychiatric: He has a normal mood and affect. His behavior is normal. Judgment and thought content normal.          Assessment & Plan:  Well exam.

## 2013-09-17 ENCOUNTER — Telehealth: Payer: Self-pay | Admitting: Family Medicine

## 2013-09-17 MED ORDER — HYDROCORTISONE 2.5 % RE CREA: TOPICAL_CREAM | RECTAL | Status: DC | PRN

## 2013-09-17 NOTE — Telephone Encounter (Signed)
Refill request for Proctozone and per Dr. Clent Ridges okay to refill for 1 year, which I did send e-scribe to [facility].

## 2014-01-14 ENCOUNTER — Ambulatory Visit (INDEPENDENT_AMBULATORY_CARE_PROVIDER_SITE_OTHER): Payer: Medicare Other | Admitting: Cardiology

## 2014-01-14 ENCOUNTER — Encounter: Payer: Self-pay | Admitting: Cardiology

## 2014-01-14 VITALS — BP 130/70 | HR 80 | Ht 69.0 in | Wt 189.0 lb

## 2014-01-14 DIAGNOSIS — I5022 Chronic systolic (congestive) heart failure: Secondary | ICD-10-CM

## 2014-01-14 DIAGNOSIS — I428 Other cardiomyopathies: Secondary | ICD-10-CM

## 2014-01-14 DIAGNOSIS — E785 Hyperlipidemia, unspecified: Secondary | ICD-10-CM

## 2014-01-14 DIAGNOSIS — I509 Heart failure, unspecified: Secondary | ICD-10-CM

## 2014-01-14 DIAGNOSIS — I10 Essential (primary) hypertension: Secondary | ICD-10-CM

## 2014-01-14 NOTE — Progress Notes (Signed)
Nicholas Wilkerson Date of Birth: [birth year]   History of Present Illness: Nicholas Wilkerson is seen for followup. He has a history of dilated cardiomyopathy with initial ejection fraction of 30%. Subsequent evaluation has demonstrated improvement to 50-55%. Last Echo was in May 2012. He states he has been doing very well. He remains very active. He has had no shortness of breath, PND, or palpitations. He's had no chest pain. He has not required diuretic therapy.  Current Outpatient Prescriptions on File Prior to Visit  Medication Sig Dispense Refill  . ASPIRIN PO Take 81 mg by mouth daily.       . carvedilol (COREG) 25 MG tablet Take 1 tablet (25 mg total) by mouth 2 (two) times daily with a meal.  180 tablet  3  . diclofenac (VOLTAREN) 75 MG EC tablet Take 1 tablet (75 mg total) by mouth 2 (two) times daily.  180 tablet  3  . hydrocortisone (PROCTOZONE-HC) 2.5 % rectal cream Place rectally as needed.  90 g  3  . multivitamin (THERAGRAN) per tablet Take 1 tablet by mouth daily.        . quinapril (ACCUPRIL) 20 MG tablet Take 1 tablet (20 mg total) by mouth at bedtime.  90 tablet  3  . simvastatin (ZOCOR) 40 MG tablet Take 1 tablet (40 mg total) by mouth at bedtime.  90 tablet  3  . tadalafil (CIALIS) 20 MG tablet Take 1 tablet (20 mg total) by mouth daily as needed for erectile dysfunction.  30 tablet  3   No current facility-administered medications on file prior to visit.    No Known Allergies  Past Medical History  Diagnosis Date  . Hypertension   . Dilated cardiomyopathy   . Hypercholesterolemia   . DIVERTICULOSIS, COLON 12/08/2007  . ARTHRITIS 10/22/2008  . History of echocardiogram 05/22/2007    EF was 45-50% / Mild concentric LV hypertrophy with mild global hypokinesis and overall mild systolic dysfunction .  Mild AV sclerosis / Mild Mitral insufficiency / compared to prior study 04/24/02 -- LV function has improved further.    . CHF (congestive heart failure)     sees Dr. Serina Nichter [name]      Past Surgical History  Procedure Laterality Date  . Cardiac catheterization  10/17/2001    EF estimated at 30% / moderate LV enlargement  / 1. Minimal nonobstructive atherosclerotic coronary artery disease / 2. Severe LV dysfunction with global hypokinesia consistent with dilated nonischemic cardiomyopath / 3. Moderate pulmonary hypertension  . Colonoscopy  08-15-07    per Dr. Carlean Purl, clear , repeat in 10 yrs    History  Smoking status  . Never Smoker   Smokeless tobacco  . Never Used    History  Alcohol Use  . 0.5 oz/week  . 1 drink(s) per week    Family History  Problem Relation Age of Onset  . Stroke Father   . Angina Mother     Review of Systems:  All other systems were reviewed and are negative.  Physical Exam: BP 130/70  Pulse 80  Ht 5\' 9"  (1.753 m)  Wt 189 lb (85.73 kg)  BMI 27.90 kg/m2 He is a pleasant white male in no acute distress. His HEENT exam is unremarkable. No JVD or bruits. Lungs are clear. Cardiac exam reveals a regular rate and rhythm without gallop or murmur. Normal S1-2. Abdomen soft and nontender. No masses or HSM.  He has no edema. Pedal pulses are good. LABORATORY DATA:  Lab Results  Component Value Date   WBC 6.5 09/05/2013   HGB 13.9 09/05/2013   HCT 42.7 09/05/2013   PLT 228.0 09/05/2013   GLUCOSE 90 09/05/2013   CHOL 123 09/05/2013   TRIG 104.0 09/05/2013   HDL 31.90* 09/05/2013   LDLCALC 70 09/05/2013   ALT 23 09/05/2013   AST 22 09/05/2013   NA 139 09/05/2013   K 3.8 09/05/2013   CL 106 09/05/2013   CREATININE 1.1 09/05/2013   BUN 19 09/05/2013   CO2 29 09/05/2013   TSH 1.16 09/05/2013   PSA 2.03 09/05/2013     Assessment / Plan:  1. Dilated cardiomyopathy with recovery of LV function to 50-55%. We will continue long-term carvedilol and quinapril. Follow up in 6 months.  2. Hypertension well controlled.  3. PVCs, asymptomatic. Continue carvedilol.

## 2014-01-14 NOTE — Patient Instructions (Signed)
Continue your current therapy  I will see you in 6 months.   

## 2014-03-18 ENCOUNTER — Encounter: Payer: Self-pay | Admitting: Family Medicine

## 2014-03-18 ENCOUNTER — Ambulatory Visit (INDEPENDENT_AMBULATORY_CARE_PROVIDER_SITE_OTHER): Payer: Medicare Other | Admitting: Family Medicine

## 2014-03-18 VITALS — BP 126/75 | HR 72 | Temp 98.8°F | Ht 69.0 in | Wt 190.0 lb

## 2014-03-18 DIAGNOSIS — N401 Enlarged prostate with lower urinary tract symptoms: Secondary | ICD-10-CM

## 2014-03-18 DIAGNOSIS — M778 Other enthesopathies, not elsewhere classified: Secondary | ICD-10-CM

## 2014-03-18 DIAGNOSIS — N138 Other obstructive and reflux uropathy: Secondary | ICD-10-CM

## 2014-03-18 DIAGNOSIS — M779 Enthesopathy, unspecified: Principal | ICD-10-CM

## 2014-03-18 DIAGNOSIS — N139 Obstructive and reflux uropathy, unspecified: Secondary | ICD-10-CM

## 2014-03-18 MED ORDER — SAW PALMETTO (SERENOA REPENS) 160 MG PO CAPS: 160.0000 mg | ORAL_CAPSULE | Freq: Every day | ORAL | Status: DC

## 2014-03-18 NOTE — Progress Notes (Signed)
Pre visit review using our clinic review tool, if applicable. No additional management support is needed unless otherwise documented below in the visit note. 

## 2014-03-18 NOTE — Progress Notes (Signed)
   Subjective:    Patient ID: Nicholas Wilkerson, male    DOB: 09-26-1941, 73 y.o.   MRN: [id number]  HPI Here for 2 things. First he has had left elbow pain for 2 weeks. This started after he stained his deck. Using Biofreeze and an ACE wrap. He takes Diclofenac once a day. Also for the past few months he has had some trouble emptying his bladder. His stream is slow and he now gets up once at night to urinate. No discomfort. He had a prostate exam with his cpx last December.    Review of Systems  Genitourinary: Positive for urgency and difficulty urinating. Negative for dysuria, frequency, hematuria, flank pain and testicular pain.  Musculoskeletal: Positive for arthralgias. Negative for joint swelling.       Objective:   Physical Exam  Constitutional: He appears well-developed and well-nourished.  Musculoskeletal:  The left elbow is tender over the olecranon but there is no swelling or erythema. Full ROM           Assessment & Plan:  Triceps tendonitis from overuse. He needs to rest the arm as much as possible for the next month. Increase Diclofenac to bid. Try saw palmetto for the prostate.

## 2014-07-31 ENCOUNTER — Telehealth: Payer: Self-pay | Admitting: Family Medicine

## 2014-07-31 ENCOUNTER — Ambulatory Visit (INDEPENDENT_AMBULATORY_CARE_PROVIDER_SITE_OTHER): Payer: Medicare Other

## 2014-07-31 DIAGNOSIS — Z23 Encounter for immunization: Secondary | ICD-10-CM

## 2014-07-31 NOTE — Telephone Encounter (Signed)
Please add the following to the below re-fill request: simvastatin (ZOCOR) 40 MG tablet quinapril (ACCUPRIL) 20 MG tablet diclofenac (VOLTAREN) 75 MG EC tablet

## 2014-07-31 NOTE — Telephone Encounter (Signed)
[facility] ([facility] - [place], [place] is requesting re-fill on carvedilol (COREG) 25 MG tablet

## 2014-08-02 MED ORDER — SIMVASTATIN 40 MG PO TABS: 40.0000 mg | ORAL_TABLET | Freq: Every day | ORAL | Status: DC

## 2014-08-02 MED ORDER — CARVEDILOL 25 MG PO TABS: 25.0000 mg | ORAL_TABLET | Freq: Two times a day (BID) | ORAL | Status: DC

## 2014-08-02 MED ORDER — DICLOFENAC SODIUM 75 MG PO TBEC: 75.0000 mg | DELAYED_RELEASE_TABLET | Freq: Two times a day (BID) | ORAL | Status: DC

## 2014-08-02 MED ORDER — QUINAPRIL HCL 20 MG PO TABS: 20.0000 mg | ORAL_TABLET | Freq: Every day | ORAL | Status: DC

## 2014-08-02 NOTE — Telephone Encounter (Signed)
rx's sent in electronically 

## 2014-08-02 NOTE — Addendum Note (Signed)
Addended by: Townsend Roger D on: 08/02/2014 10:01 AM   Modules accepted: Orders

## 2014-08-05 ENCOUNTER — Ambulatory Visit (INDEPENDENT_AMBULATORY_CARE_PROVIDER_SITE_OTHER): Payer: Medicare Other | Admitting: Cardiology

## 2014-08-05 ENCOUNTER — Encounter: Payer: Self-pay | Admitting: Cardiology

## 2014-08-05 VITALS — BP 120/76 | HR 69 | Ht 69.0 in | Wt 188.6 lb

## 2014-08-05 DIAGNOSIS — E7800 Pure hypercholesterolemia, unspecified: Secondary | ICD-10-CM

## 2014-08-05 DIAGNOSIS — I5022 Chronic systolic (congestive) heart failure: Secondary | ICD-10-CM

## 2014-08-05 DIAGNOSIS — I10 Essential (primary) hypertension: Secondary | ICD-10-CM

## 2014-08-05 DIAGNOSIS — I420 Dilated cardiomyopathy: Secondary | ICD-10-CM

## 2014-08-05 NOTE — Patient Instructions (Signed)
Continue your current therapy  I will see you in one year   

## 2014-08-05 NOTE — Progress Notes (Addendum)
Nicholas Wilkerson Date of Birth: Oct 28, 1940   History of Present Illness: Nicholas Wilkerson is seen for followup. He has a history of dilated cardiomyopathy with initial ejection fraction of 30%. Subsequent evaluation has demonstrated improvement to 50-55%. Last Echo was in May 2012. He states he has been doing very well. He remains very active. He has had no shortness of breath, PND, or palpitations. He's had no chest pain. He has not required diuretic therapy. He states his granddaughter has a degenerative neurologic condition and he spends a lot of time caring for her.  Current Outpatient Prescriptions on File Prior to Visit  Medication Sig Dispense Refill  . ASPIRIN PO Take 81 mg by mouth daily.       . carvedilol (COREG) 25 MG tablet Take 1 tablet (25 mg total) by mouth 2 (two) times daily with a meal.  180 tablet  3  . diclofenac (VOLTAREN) 75 MG EC tablet Take 1 tablet (75 mg total) by mouth 2 (two) times daily.  180 tablet  3  . hydrocortisone (PROCTOZONE-HC) 2.5 % rectal cream Place rectally as needed.  90 g  3  . multivitamin (THERAGRAN) per tablet Take 1 tablet by mouth daily.        . quinapril (ACCUPRIL) 20 MG tablet Take 1 tablet (20 mg total) by mouth at bedtime.  90 tablet  3  . saw palmetto 160 MG capsule Take 1 capsule (160 mg total) by mouth daily.  30 capsule  0  . simvastatin (ZOCOR) 40 MG tablet Take 1 tablet (40 mg total) by mouth at bedtime.  90 tablet  3  . tadalafil (CIALIS) 20 MG tablet Take 1 tablet (20 mg total) by mouth daily as needed for erectile dysfunction.  30 tablet  3  . TRAVATAN Z 0.004 % SOLN ophthalmic solution        No current facility-administered medications on file prior to visit.    No Known Allergies  Past Medical History  Diagnosis Date  . Hypertension   . Dilated cardiomyopathy   . Hypercholesterolemia   . DIVERTICULOSIS, COLON 12/08/2007  . ARTHRITIS 10/22/2008  . History of echocardiogram 05/22/2007    EF was 45-50% / Mild concentric LV  hypertrophy with mild global hypokinesis and overall mild systolic dysfunction .  Mild AV sclerosis / Mild Mitral insufficiency / compared to prior study 04/24/02 -- LV function has improved further.    . CHF (congestive heart failure)     sees Dr. Clem Wisenbaker [name]   . BPH (benign prostatic hyperplasia)     Past Surgical History  Procedure Laterality Date  . Cardiac catheterization  10/17/2001    EF estimated at 30% / moderate LV enlargement  / 1. Minimal nonobstructive atherosclerotic coronary artery disease / 2. Severe LV dysfunction with global hypokinesia consistent with dilated nonischemic cardiomyopath / 3. Moderate pulmonary hypertension  . Colonoscopy  08-15-07    per Dr. Carlean Purl, clear , repeat in 10 yrs    History  Smoking status  . Never Smoker   Smokeless tobacco  . Never Used    History  Alcohol Use  . 0.5 oz/week  . 1 drink(s) per week    Family History  Problem Relation Age of Onset  . Stroke Father   . Angina Mother     Review of Systems:  All other systems were reviewed and are negative.  Physical Exam: BP 120/76  Pulse 69  Ht 5\' 9"  (1.753 m)  Wt 188 lb 9.6 oz (85.548  kg)  BMI 27.84 kg/m2 He is a pleasant white male in no acute distress. His HEENT exam is unremarkable. No JVD or bruits. Lungs are clear. Cardiac exam reveals a regular rate and rhythm without gallop or murmur. Normal S1-2. Abdomen soft and nontender. No masses or HSM.  He has no edema. Pedal pulses are good. Neuro exam is without focal findings.  LABORATORY DATA:  Lab Results  Component Value Date   WBC 6.5 09/05/2013   HGB 13.9 09/05/2013   HCT 42.7 09/05/2013   PLT 228.0 09/05/2013   GLUCOSE 90 09/05/2013   CHOL 123 09/05/2013   TRIG 104.0 09/05/2013   HDL 31.90* 09/05/2013   LDLCALC 70 09/05/2013   ALT 23 09/05/2013   AST 22 09/05/2013   NA 139 09/05/2013   K 3.8 09/05/2013   CL 106 09/05/2013   CREATININE 1.1 09/05/2013   BUN 19 09/05/2013   CO2 29 09/05/2013   TSH 1.16  09/05/2013   PSA 2.03 09/05/2013   Ecg today shows NSR with rate 69 bpm, LVH with nonspecific TWA. I have personally reviewed and interpreted this study.   Assessment / Plan:  1. Dilated cardiomyopathy with recovery of LV function to 50-55%. We will continue long-term carvedilol and quinapril. Follow up in one year.  2. Hypertension well controlled.  3. PVCs, asymptomatic. Continue carvedilol.   He is scheduled for physical with Dr. Sarajane Jews in one month and will have lab work then.

## 2014-08-06 MED ORDER — DICLOFENAC SODIUM 75 MG PO TBEC: 75.0000 mg | DELAYED_RELEASE_TABLET | Freq: Two times a day (BID) | ORAL | Status: DC

## 2014-08-06 MED ORDER — CARVEDILOL 25 MG PO TABS: 25.0000 mg | ORAL_TABLET | Freq: Two times a day (BID) | ORAL | Status: DC

## 2014-08-06 MED ORDER — SIMVASTATIN 40 MG PO TABS: 40.0000 mg | ORAL_TABLET | Freq: Every day | ORAL | Status: DC

## 2014-08-06 MED ORDER — QUINAPRIL HCL 20 MG PO TABS: 20.0000 mg | ORAL_TABLET | Freq: Every day | ORAL | Status: DC

## 2014-08-06 NOTE — Telephone Encounter (Signed)
rx sent in electronically to [facility]

## 2014-08-06 NOTE — Telephone Encounter (Signed)
The below re-fills were sent to the wrong location. Can you please re-send them to [facility]

## 2014-08-06 NOTE — Addendum Note (Signed)
Addended by: Townsend Roger D on: 08/06/2014 05:02 PM   Modules accepted: Orders

## 2014-09-09 ENCOUNTER — Other Ambulatory Visit (INDEPENDENT_AMBULATORY_CARE_PROVIDER_SITE_OTHER): Payer: Medicare Other

## 2014-09-09 DIAGNOSIS — I10 Essential (primary) hypertension: Secondary | ICD-10-CM

## 2014-09-09 DIAGNOSIS — Z125 Encounter for screening for malignant neoplasm of prostate: Secondary | ICD-10-CM

## 2014-09-09 DIAGNOSIS — Z0000 Encounter for general adult medical examination without abnormal findings: Secondary | ICD-10-CM

## 2014-09-09 DIAGNOSIS — E780 Pure hypercholesterolemia: Secondary | ICD-10-CM

## 2014-09-09 LAB — POCT URINALYSIS DIPSTICK
Bilirubin, UA: NEGATIVE
Glucose, UA: NEGATIVE
Ketones, UA: NEGATIVE
Leukocytes, UA: NEGATIVE
Nitrite, UA: NEGATIVE
RBC UA: NEGATIVE
Spec Grav, UA: 1.02
Urobilinogen, UA: 0.2
pH, UA: 5.5

## 2014-09-09 LAB — CBC WITH DIFFERENTIAL/PLATELET
Basophils Absolute: 0.1 10*3/uL (ref 0.0–0.1)
Basophils Relative: 0.7 % (ref 0.0–3.0)
EOS PCT: 1.8 % (ref 0.0–5.0)
Eosinophils Absolute: 0.1 10*3/uL (ref 0.0–0.7)
HEMATOCRIT: 48.1 % (ref 39.0–52.0)
Hemoglobin: 15.5 g/dL (ref 13.0–17.0)
Lymphocytes Relative: 26.6 % (ref 12.0–46.0)
Lymphs Abs: 2.1 10*3/uL (ref 0.7–4.0)
MCHC: 32.3 g/dL (ref 30.0–36.0)
MCV: 88.2 fl (ref 78.0–100.0)
MONOS PCT: 9.2 % (ref 3.0–12.0)
Monocytes Absolute: 0.7 10*3/uL (ref 0.1–1.0)
NEUTROS PCT: 61.7 % (ref 43.0–77.0)
Neutro Abs: 4.8 10*3/uL (ref 1.4–7.7)
PLATELETS: 230 10*3/uL (ref 150.0–400.0)
RBC: 5.45 Mil/uL (ref 4.22–5.81)
RDW: 14.7 % (ref 11.5–15.5)
WBC: 7.7 10*3/uL (ref 4.0–10.5)

## 2014-09-09 LAB — HEPATIC FUNCTION PANEL
ALT: 41 U/L (ref 0–53)
AST: 30 U/L (ref 0–37)
Albumin: 3.9 g/dL (ref 3.5–5.2)
Alkaline Phosphatase: 75 U/L (ref 39–117)
BILIRUBIN DIRECT: 0.1 mg/dL (ref 0.0–0.3)
BILIRUBIN TOTAL: 0.5 mg/dL (ref 0.2–1.2)
Total Protein: 6.4 g/dL (ref 6.0–8.3)

## 2014-09-09 LAB — LIPID PANEL
CHOLESTEROL: 145 mg/dL (ref 0–200)
HDL: 31.9 mg/dL — ABNORMAL LOW (ref >39.00)
LDL Cholesterol: 77 mg/dL (ref 0–99)
NonHDL: 113.1
Total CHOL/HDL Ratio: 5
Triglycerides: 183 mg/dL — ABNORMAL HIGH (ref 0.0–149.0)
VLDL: 36.6 mg/dL (ref 0.0–40.0)

## 2014-09-09 LAB — BASIC METABOLIC PANEL
BUN: 19 mg/dL (ref 6–23)
CO2: 24 mEq/L (ref 19–32)
CREATININE: 1.1 mg/dL (ref 0.4–1.5)
Calcium: 9 mg/dL (ref 8.4–10.5)
Chloride: 108 mEq/L (ref 96–112)
GFR: 68.69 mL/min (ref >60.00)
Glucose, Bld: 102 mg/dL — ABNORMAL HIGH (ref 70–99)
Potassium: 3.9 mEq/L (ref 3.5–5.1)
Sodium: 140 mEq/L (ref 135–145)

## 2014-09-09 LAB — PSA: PSA: 3.43 ng/mL (ref 0.10–4.00)

## 2014-09-09 LAB — TSH: TSH: 1.5 u[IU]/mL (ref 0.35–4.50)

## 2014-09-16 ENCOUNTER — Encounter: Payer: Self-pay | Admitting: Family Medicine

## 2014-09-16 ENCOUNTER — Ambulatory Visit (INDEPENDENT_AMBULATORY_CARE_PROVIDER_SITE_OTHER): Payer: Medicare Other | Admitting: Family Medicine

## 2014-09-16 VITALS — BP 138/73 | HR 77 | Temp 98.1°F | Ht 69.0 in | Wt 190.0 lb

## 2014-09-16 DIAGNOSIS — Z0000 Encounter for general adult medical examination without abnormal findings: Secondary | ICD-10-CM

## 2014-09-16 MED ORDER — HYDROCORTISONE 2.5 % RE CREA: TOPICAL_CREAM | RECTAL | Status: DC | PRN

## 2014-09-16 NOTE — Progress Notes (Signed)
Pre visit review using our clinic review tool, if applicable. No additional management support is needed unless otherwise documented below in the visit note. 

## 2014-09-16 NOTE — Progress Notes (Signed)
   Subjective:    Patient ID: Nicholas Wilkerson, male    DOB: 1941/10/01, 73 y.o.   MRN: [id number]  HPI 73 yr old male for a cpx. He feels well.    Review of Systems  Constitutional: Negative.   HENT: Negative.   Eyes: Negative.   Respiratory: Negative.   Cardiovascular: Negative.   Gastrointestinal: Negative.   Genitourinary: Negative.   Musculoskeletal: Negative.   Skin: Negative.   Neurological: Negative.   Psychiatric/Behavioral: Negative.        Objective:   Physical Exam  Constitutional: He is oriented to person, place, and time. He appears well-developed and well-nourished. No distress.  HENT:  Head: Normocephalic and atraumatic.  Right Ear: External ear normal.  Left Ear: External ear normal.  Nose: Nose normal.  Mouth/Throat: Oropharynx is clear and moist. No oropharyngeal exudate.  Eyes: Conjunctivae and EOM are normal. Pupils are equal, round, and reactive to light. Right eye exhibits no discharge. Left eye exhibits no discharge. No scleral icterus.  Neck: Neck supple. No JVD present. No tracheal deviation present. No thyromegaly present.  Cardiovascular: Normal rate, regular rhythm, normal heart sounds and intact distal pulses.  Exam reveals no gallop and no friction rub.   No murmur heard. Pulmonary/Chest: Effort normal and breath sounds normal. No respiratory distress. He has no wheezes. He has no rales. He exhibits no tenderness.  Abdominal: Soft. Bowel sounds are normal. He exhibits no distension and no mass. There is no tenderness. There is no rebound and no guarding.  Genitourinary: Rectum normal, prostate normal and penis normal. Guaiac negative stool. No penile tenderness.  Musculoskeletal: Normal range of motion. He exhibits no edema or tenderness.  Lymphadenopathy:    He has no cervical adenopathy.  Neurological: He is alert and oriented to person, place, and time. He has normal reflexes. No cranial nerve deficit. He exhibits normal muscle tone.  Coordination normal.  Skin: Skin is warm and dry. No rash noted. He is not diaphoretic. No erythema. No pallor.  Psychiatric: He has a normal mood and affect. His behavior is normal. Judgment and thought content normal.          Assessment & Plan:  Well exam.

## 2014-09-18 ENCOUNTER — Telehealth: Payer: Self-pay | Admitting: Family Medicine

## 2014-09-18 NOTE — Telephone Encounter (Signed)
[facility] would like clarification on hydrocortisone (PROCTOZONE-HC) 2.5 % rectal cream They say pt has a history of a different protozone cream.  Do you want them to dispense as written or pt's past rx? Ref No:  [id number]

## 2014-09-19 NOTE — Telephone Encounter (Signed)
We faxed the form already.

## 2014-09-30 ENCOUNTER — Ambulatory Visit (INDEPENDENT_AMBULATORY_CARE_PROVIDER_SITE_OTHER): Payer: Medicare Other | Admitting: Family Medicine

## 2014-09-30 ENCOUNTER — Encounter: Payer: Self-pay | Admitting: Family Medicine

## 2014-09-30 VITALS — BP 126/76 | HR 70 | Temp 98.8°F | Ht 69.0 in | Wt 190.0 lb

## 2014-09-30 DIAGNOSIS — R590 Localized enlarged lymph nodes: Secondary | ICD-10-CM

## 2014-09-30 NOTE — Progress Notes (Signed)
   Subjective:    Patient ID: Nicholas Wilkerson, male    DOB: 1940/12/28, 73 y.o.   MRN: [id number]  HPI Here to check a lump that he noticed 3 days ago in the right side of the neck. He felt this while shaving but it is not tender. He denies any ST or URI sx. He did have a fairly aggressive dental cleaning about 10 days ago.    Review of Systems  Constitutional: Negative.   HENT: Negative.   Eyes: Negative.   Respiratory: Negative.   Hematological: Positive for adenopathy.       Objective:   Physical Exam  Constitutional: He appears well-developed and well-nourished.  HENT:  Head: Normocephalic and atraumatic.  Right Ear: External ear normal.  Left Ear: External ear normal.  Nose: Nose normal.  Mouth/Throat: Oropharynx is clear and moist. No oropharyngeal exudate.  Eyes: Conjunctivae are normal.  Neck: Neck supple. No thyromegaly present.  Single non-tender right AC node           Assessment & Plan:  This is probably a reactive lymph node that has become inflamed as a result of his recent dental cleaning. This should go back down over the next few weeks. If not, he will follow up

## 2014-09-30 NOTE — Progress Notes (Signed)
Pre visit review using our clinic review tool, if applicable. No additional management support is needed unless otherwise documented below in the visit note. 

## 2014-10-11 DIAGNOSIS — C140 Malignant neoplasm of pharynx, unspecified: Secondary | ICD-10-CM

## 2014-10-11 HISTORY — DX: Malignant neoplasm of pharynx, unspecified: C14.0

## 2014-10-18 ENCOUNTER — Telehealth: Payer: Self-pay | Admitting: Family Medicine

## 2014-10-18 MED ORDER — AMOXICILLIN 875 MG PO TABS: 875.0000 mg | ORAL_TABLET | Freq: Two times a day (BID) | ORAL | Status: DC

## 2014-10-18 NOTE — Telephone Encounter (Signed)
Call in Amoxicillin 875 bid for 10 days

## 2014-10-18 NOTE — Telephone Encounter (Signed)
I sent script e-scribe and spoke with pt. 

## 2014-10-18 NOTE — Telephone Encounter (Signed)
Pt was seen on 09/30/14 for swollen lymph nodes in neck. Pt still having issue. Please advise

## 2014-10-29 ENCOUNTER — Telehealth: Payer: Self-pay | Admitting: Family Medicine

## 2014-10-29 NOTE — Telephone Encounter (Signed)
Pt was on abx for lump on neck. Pt still has lump. Please advise

## 2014-10-30 MED ORDER — AMOXICILLIN-POT CLAVULANATE 875-125 MG PO TABS: 1.0000 | ORAL_TABLET | Freq: Two times a day (BID) | ORAL | Status: DC

## 2014-10-30 NOTE — Telephone Encounter (Signed)
Call in Augmentin 875 bid for 10 days. If the node is still present after this is finished, he should see me again

## 2014-10-30 NOTE — Telephone Encounter (Signed)
I sent script e-scribe and spoke with pt. 

## 2014-11-12 ENCOUNTER — Encounter: Payer: Self-pay | Admitting: Family Medicine

## 2014-11-12 ENCOUNTER — Ambulatory Visit (INDEPENDENT_AMBULATORY_CARE_PROVIDER_SITE_OTHER): Payer: Medicare Other | Admitting: Family Medicine

## 2014-11-12 VITALS — BP 125/74 | HR 73 | Temp 99.0°F | Ht 69.0 in | Wt 188.0 lb

## 2014-11-12 DIAGNOSIS — C4492 Squamous cell carcinoma of skin, unspecified: Secondary | ICD-10-CM

## 2014-11-12 DIAGNOSIS — R590 Localized enlarged lymph nodes: Secondary | ICD-10-CM

## 2014-11-12 NOTE — Progress Notes (Signed)
   Subjective:    Patient ID: Nicholas Wilkerson, male    DOB: September 27, 1941, 74 y.o.   MRN: [id number]  HPI Here to recheck the node in the right side of his neck. This has been present for about 6 weeks and it has not changed in size. He took a course of Amoxicillin and then a course of Augmentin, with no response. He feels fine. Not other nodes have appeared, and he has been careful about checking his neck, axillae, and groins.    Review of Systems  Constitutional: Negative.   HENT: Negative.   Eyes: Negative.   Respiratory: Negative.   Hematological: Positive for adenopathy.       Objective:   Physical Exam  Constitutional: He appears well-developed and well-nourished.  HENT:  Right Ear: External ear normal.  Left Ear: External ear normal.  Nose: Nose normal.  Mouth/Throat: Oropharynx is clear and moist.  Eyes: Conjunctivae are normal.  Neck: No thyromegaly present.  There is a large firm non-tender mass in the right neck, it seems to be slightly larger than on our last exam   Pulmonary/Chest: Effort normal and breath sounds normal.          Assessment & Plan:  This needs to be biopsied, so we will refer him to ENT

## 2014-11-12 NOTE — Progress Notes (Signed)
Pre visit review using our clinic review tool, if applicable. No additional management support is needed unless otherwise documented below in the visit note. 

## 2014-11-20 ENCOUNTER — Other Ambulatory Visit: Payer: Self-pay | Admitting: Otolaryngology

## 2014-11-20 ENCOUNTER — Other Ambulatory Visit (HOSPITAL_COMMUNITY)
Admission: RE | Admit: 2014-11-20 | Discharge: 2014-11-20 | Disposition: A | Discharge status: Home / Self Care | Payer: Medicare Other | Source: Ambulatory Visit | Attending: Otolaryngology | Admitting: Otolaryngology

## 2014-11-20 DIAGNOSIS — IMO0002 Reserved for concepts with insufficient information to code with codable children: Secondary | ICD-10-CM

## 2014-11-20 DIAGNOSIS — R221 Localized swelling, mass and lump, neck: Secondary | ICD-10-CM | POA: Insufficient documentation

## 2014-11-20 HISTORY — DX: Reserved for concepts with insufficient information to code with codable children: IMO0002

## 2014-11-25 NOTE — Addendum Note (Signed)
Addended by: Alysia Penna A on: 11/25/2014 11:35 AM   Modules accepted: Orders

## 2014-11-27 ENCOUNTER — Telehealth: Payer: Self-pay | Admitting: Hematology and Oncology

## 2014-11-27 ENCOUNTER — Other Ambulatory Visit: Payer: Self-pay | Admitting: Otolaryngology

## 2014-11-27 DIAGNOSIS — R591 Generalized enlarged lymph nodes: Secondary | ICD-10-CM

## 2014-11-27 NOTE — Telephone Encounter (Signed)
pt confirmed appt on 12/02/14 at 1:30 w/ Alvy Bimler Dx: Cervical adenopathy, cancer of skin Referring Dr. Sarajane Jews Velora Heckler primary)

## 2014-12-02 ENCOUNTER — Ambulatory Visit (HOSPITAL_BASED_OUTPATIENT_CLINIC_OR_DEPARTMENT_OTHER): Payer: Medicare Other | Admitting: Hematology and Oncology

## 2014-12-02 ENCOUNTER — Encounter: Payer: Self-pay | Admitting: Hematology and Oncology

## 2014-12-02 ENCOUNTER — Telehealth: Payer: Self-pay | Admitting: Hematology and Oncology

## 2014-12-02 ENCOUNTER — Ambulatory Visit
Admission: RE | Admit: 2014-12-02 | Discharge: 2014-12-02 | Disposition: A | Discharge status: Home / Self Care | Payer: Medicare Other | Source: Ambulatory Visit | Attending: Otolaryngology | Admitting: Otolaryngology

## 2014-12-02 VITALS — BP 131/62 | HR 67 | Temp 98.0°F | Resp 18 | Ht 69.0 in | Wt 187.3 lb

## 2014-12-02 DIAGNOSIS — IMO0002 Reserved for concepts with insufficient information to code with codable children: Secondary | ICD-10-CM

## 2014-12-02 DIAGNOSIS — R591 Generalized enlarged lymph nodes: Secondary | ICD-10-CM

## 2014-12-02 DIAGNOSIS — C770 Secondary and unspecified malignant neoplasm of lymph nodes of head, face and neck: Secondary | ICD-10-CM

## 2014-12-02 DIAGNOSIS — Z85828 Personal history of other malignant neoplasm of skin: Secondary | ICD-10-CM

## 2014-12-02 DIAGNOSIS — C01 Malignant neoplasm of base of tongue: Secondary | ICD-10-CM | POA: Insufficient documentation

## 2014-12-02 MED ORDER — IOHEXOL 300 MG/ML  SOLN
75.0000 mL | Freq: Once | INTRAMUSCULAR | Status: AC | PRN
  Administered 2014-12-02: 75 mL via INTRAVENOUS

## 2014-12-02 NOTE — Telephone Encounter (Signed)
Pt confirmed MD visit per 02/22 POF, gave pt AVS... KJ  °

## 2014-12-03 ENCOUNTER — Telehealth: Payer: Self-pay | Admitting: *Deleted

## 2014-12-03 DIAGNOSIS — Z85828 Personal history of other malignant neoplasm of skin: Secondary | ICD-10-CM | POA: Insufficient documentation

## 2014-12-03 NOTE — Progress Notes (Signed)
[facility] NOTE  Patient Care Team: Laurey Morale, MD as PCP - General (Family Medicine) Heath Lark, MD as Consulting Physician (Hematology and Oncology)  CHIEF COMPLAINTS/PURPOSE OF CONSULTATION:  Newly diagnosed squamous cell carcinoma of base of tongue  HISTORY OF PRESENTING ILLNESS:  Nicholas Wilkerson 74 y.o. male is here because of newly diagnosed squamous cell carcinoma of base of tongue. At the time of dictation, CT scan provided additional information that come from the origin of squamous cell carcinoma found in recent lymph node biopsy originated from base of tongue. According to the patient, the first initial presentation was due to palpable lymph node on the right side of the neck since December of 2015. He received 2 courses of oral antibiotic therapy without improvement and was subsequently referred to the ENT for further evaluation. I review his chart extensively and summarized as follows: Oncology History   Cancer of base of tongue   Staging form: Lip and Oral Cavity, AJCC 7th Edition     Clinical stage from 12/03/2014: T2, N2c - Signed by Heath Lark, MD on 12/03/2014        Cancer of base of tongue   11/20/2014 Pathology Results Accession: [id number] confirm squamous cell carcinoma.   11/20/2014 Procedure The patient underwent fine-needle aspirate of the right lymph node   12/02/2014 Imaging CT scan showed 2.2 cm the right tongue base mass with necrotic 4 cm right level II nodal metastasis with multiple bilateral small lymphadenopathy    he denies any hearing deficit, difficulties with chewing food, swallowing difficulties, painful swallowing, changes in the quality of his voice or abnormal weight loss. Of note, he had history of squamous cell carcinoma removed from the skin and basal cell carcinoma as well. The patient had asbestosis exposure in the past. He denies significant alcoholism and is a nonsmoker.  MEDICAL HISTORY:  Past Medical History   Diagnosis Date  . Hypertension   . Dilated cardiomyopathy   . Hypercholesterolemia   . DIVERTICULOSIS, COLON 12/08/2007  . ARTHRITIS 10/22/2008  . History of echocardiogram 05/22/2007    EF was 45-50% / Mild concentric LV hypertrophy with mild global hypokinesis and overall mild systolic dysfunction .  Mild AV sclerosis / Mild Mitral insufficiency / compared to prior study 04/24/02 -- LV function has improved further.    . CHF (congestive heart failure)     sees Dr. Peter [name]   . BPH (benign prostatic hyperplasia)   . Squamous cell carcinoma 12/02/2014    SURGICAL HISTORY: Past Surgical History  Procedure Laterality Date  . Cardiac catheterization  10/17/2001    EF estimated at 30% / moderate LV enlargement  / 1. Minimal nonobstructive atherosclerotic coronary artery disease / 2. Severe LV dysfunction with global hypokinesia consistent with dilated nonischemic cardiomyopath / 3. Moderate pulmonary hypertension  . Colonoscopy  08-15-07    per Dr. Carlean Purl, clear , repeat in 10 yrs  . Lymph node biopsy      SOCIAL HISTORY: History   Social History  . Marital Status: Married    Spouse Name: N/A  . Number of Children: 4  . Years of Education: N/A   Occupational History  . 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 Smoking status: Never Smoker   . Smokeless tobacco: Never Used  . Alcohol Use: 0.0 oz/week    0 Standard drinks or equivalent per week     Comment: occ  . Drug Use: No  .  Sexual Activity: Not on file     Comment: retired Pension scheme manager, married   Other Topics Concern  . Not on file   Social History Narrative    FAMILY HISTORY: Family History  Problem Relation Age of Onset  . Stroke Father   . Cancer Father     kidney ca  . Angina Mother     ALLERGIES:  has No Known Allergies.  MEDICATIONS:  Current Outpatient Prescriptions  Medication Sig Dispense Refill  . ASPIRIN PO Take 81 mg by mouth daily.     . carvedilol (COREG) 25 MG  tablet Take 1 tablet (25 mg total) by mouth 2 (two) times daily with a meal. 180 tablet 3  . diclofenac (VOLTAREN) 75 MG EC tablet Take 1 tablet (75 mg total) by mouth 2 (two) times daily. 180 tablet 3  . multivitamin (THERAGRAN) per tablet Take 1 tablet by mouth daily.      . quinapril (ACCUPRIL) 20 MG tablet Take 1 tablet (20 mg total) by mouth at bedtime. 90 tablet 3  . saw palmetto 160 MG capsule Take 1 capsule (160 mg total) by mouth daily. 30 capsule 0  . simvastatin (ZOCOR) 40 MG tablet Take 1 tablet (40 mg total) by mouth at bedtime. 90 tablet 3  . tadalafil (CIALIS) 20 MG tablet Take 1 tablet (20 mg total) by mouth daily as needed for erectile dysfunction. 30 tablet 3  . TRAVATAN Z 0.004 % SOLN ophthalmic solution     . hydrocortisone (PROCTOZONE-HC) 2.5 % rectal cream Place rectally as needed. (Patient not taking: Reported on 12/02/2014) 90 g 3   No current facility-administered medications for this visit.    REVIEW OF SYSTEMS:   Constitutional: Denies fevers, chills or abnormal night sweats Eyes: Denies blurriness of vision, double vision or watery eyes Ears, nose, mouth, throat, and face: Denies mucositis or sore throat Respiratory: Denies cough, dyspnea or wheezes Cardiovascular: Denies palpitation, chest discomfort or lower extremity swelling Gastrointestinal:  Denies nausea, heartburn or change in bowel habits Skin: Denies abnormal skin rashes Neurological:Denies numbness, tingling or new weaknesses Behavioral/Psych: Mood is stable, no new changes  All other systems were reviewed with the patient and are negative.  PHYSICAL EXAMINATION: ECOG PERFORMANCE STATUS: 0 - Asymptomatic  Filed Vitals:   12/02/14 1343  BP: 131/62  Pulse: 67  Temp: 98 F (36.7 C)  Resp: 18   Filed Weights   12/02/14 1343  Weight: 187 lb 4.8 oz (84.959 kg)    GENERAL:alert, no distress and comfortable SKIN: skin color, texture, turgor are normal, no rashes or significant lesions EYES:  normal, conjunctiva are pink and non-injected, sclera clear OROPHARYNX:no exudate, no erythema and lips, buccal mucosa, and tongue normal  NECK: supple, thyroid normal size, non-tender, without nodularity LYMPH: Palpable lymphadenopathy on the right side of the neck. I am not able to appreciate any other lymphadenopathy elsewhere.  LUNGS: clear to auscultation and percussion with normal breathing effort HEART: regular rate & rhythm and no murmurs and no lower extremity edema ABDOMEN:abdomen soft, non-tender and normal bowel sounds Musculoskeletal:no cyanosis of digits and no clubbing  PSYCH: alert & oriented x 3 with fluent speech NEURO: no focal motor/sensory deficits  LABORATORY DATA:  I have reviewed the data as listed Lab Results  Component Value Date   WBC 7.7 09/09/2014   HGB 15.5 09/09/2014   HCT 48.1 09/09/2014   MCV 88.2 09/09/2014   PLT 230.0 09/09/2014   Lab Results  Component Value Date   NA  140 09/09/2014   K 3.9 09/09/2014   CL 108 09/09/2014   CO2 24 09/09/2014    RADIOGRAPHIC STUDIES: I have personally reviewed the radiological images as listed and agreed with the findings in the report. Ct Soft Tissue Neck W Contrast  12/02/2014   CLINICAL DATA:  Lymphadenopathy. Lump on right side of neck since 09/2014. History of basal and squamous cell carcinomas of the skin of the face and head status post resection.  EXAM: CT NECK WITH CONTRAST  TECHNIQUE: Multidetector CT imaging of the neck was performed using the standard protocol following the bolus administration of intravenous contrast.  CONTRAST:  80mL OMNIPAQUE IOHEXOL 300 MG/ML  SOLN  COMPARISON:  Cervical spine CT 09/11/2009  FINDINGS: Pharynx and larynx: The nasopharynx is unremarkable. There is an enhancing mass involving the right tongue base protruding into the vallecula which measures approximately 10 x 7 x 22 mm (transverse by AP by craniocaudal). This appears to extend laterally to the right lateral oropharyngeal  wall and does not appear to involve the epiglottis. The larynx is unremarkable.  Salivary glands: The submandibular and parotid glands are unremarkable.  Thyroid: Unremarkable.  Lymph nodes: An nenlarged, partially necrotic right level IIA lymph node measures 2.6 x 2.4 x 4.0 cm. Immediately inferior to this in level III is a small but asymmetric lymph node measuring 5 mm in short axis, possibly with some subtle necrosis (series 3, image 46). A small but asymmetric right level III lymph node more posteriorly measures 4 mm (series 3, image 45). A slightly asymmetric right level IIB lymph node measures 6 mm (series 3, image 34). A small but asymmetric left level III lymph node measures 6 mm (series 3, image 50). Multiple subcentimeter lymph nodes are partially visualized in the mediastinum.  Vascular: Major vascular structures of the appear patent. Mild atherosclerosis is noted at the carotid bifurcations.  Limited intracranial: The visualized portion of the brain is unremarkable.  Visualized orbits: Unremarkable.  Mastoids and visualized paranasal sinuses: Clear.  Skeleton: Mild cervical spondylosis. No suspicious lytic or blastic osseous lesions identified.  Upper chest: Visualized lung apices are clear.  IMPRESSION: 2.2 cm right tongue base mass concerning for malignancy. Necrotic right level II nodal metastasis. Subcentimeter right level II and bilateral level III lymph nodes are indeterminate.   Electronically Signed   By: Logan Bores   On: 12/02/2014 16:47    ASSESSMENT:  Newly diagnosed squamous cell carcinoma of the Head & Neck, HPV N/A  PLAN:  Stage of the disease is to be determined, a PET/CT scan has been ordered.   Prognosis would depend on the results for the PET/CT scan, to be discussed and reviewed in the next visit. I will get his case review at the next ENT tumor board in a multidisciplinary fashion.  Treatment options would include chemotherapy only, radiation only or chemotherapy in  combination with radiation therapy.    In preparation for treatment, the patient will need the following tests or referrals, to be arranged #1 Referral to Radiation Oncology for consultation.  #2 Referral to dentist for full dental evaluation and possible dental extraction  #3 PET/CT scan.  #4 Possible referral for feeding tube placement.  #5 Possible Infusaport placement #6 Referral to Speech Pathologist  #7 Referral to Nutritionist  #8 Referral to Social Worker #9 Possible Referral to chemotherapy class to learn about practical tips while on treatment.  #10 Blood work    Orders Placed This Encounter  Procedures  . NM PET Image  Initial (PI) Skull Base To Thigh    Standing Status: Future     Number of Occurrences:      Standing Expiration Date: 02/01/2016    Order Specific Question:  Reason for Exam (SYMPTOM  OR DIAGNOSIS REQUIRED)    Answer:  staging oropharyngeal ca    Order Specific Question:  Preferred imaging location?    Answer:  [facility]    All questions were answered. The patient knows to call the clinic with any problems, questions or concerns. I spent 55 minutes counseling the patient face to face. The total time spent in the appointment was 60 minutes and more than 50% was on counseling.     [place], [place], MD 12/03/2014 7:59 AM

## 2014-12-03 NOTE — Assessment & Plan Note (Signed)
The patient have history of squamous cell carcinoma of the skin and basal cell carcinoma. His recent dermatology review were negative. I do not see any suspicious skin lesions on exam today. We will continue close observation.

## 2014-12-03 NOTE — Telephone Encounter (Signed)
Spoke with Otila Kluver at [facility], requested that newly ordered PET be scheduled so results available for next [facility] Tumor Board.  She indicated she would arrange with patient.  Gayleen Orem, RN, BSN, [place] at [place] [phone]

## 2014-12-04 ENCOUNTER — Telehealth: Payer: Self-pay | Admitting: *Deleted

## 2014-12-04 NOTE — Telephone Encounter (Signed)
Rick, I thought you were going to call the patient and let him know the results of CT scan? If you cannot do it, let me know

## 2014-12-04 NOTE — Telephone Encounter (Signed)
Patient requesting results of CT scan of neck.

## 2014-12-05 ENCOUNTER — Telehealth: Payer: Self-pay | Admitting: *Deleted

## 2014-12-05 NOTE — Telephone Encounter (Signed)
Called pt, introduced myself as the oncology nurse navigator that works with Dr. Alvy Bimler. 1. Per Dr. Alvy Bimler, I shared results of 12/02/14 CT. 2. I confirmed his understanding of 12/10/14 1:00 pm PET for further diagnostics. 3. I asked about his availability for attending next week Wednesday's [facility] [place], explained purpose and logistics of [facility].  He stated he could attend. 4. I provided my contact information, encouraged him to call me prior to my contact early next week prior to [facility].  He verbalized understanding.  Gayleen Orem, RN, BSN, [place] at [place] [phone]

## 2014-12-10 ENCOUNTER — Telehealth: Payer: Self-pay | Admitting: *Deleted

## 2014-12-10 ENCOUNTER — Encounter: Payer: Self-pay | Admitting: Radiation Oncology

## 2014-12-10 ENCOUNTER — Ambulatory Visit
Admission: RE | Admit: 2014-12-10 | Discharge: 2014-12-10 | Disposition: A | Discharge status: Home / Self Care | Payer: Medicare Other | Source: Ambulatory Visit | Attending: Hematology and Oncology | Admitting: Hematology and Oncology

## 2014-12-10 DIAGNOSIS — I10 Essential (primary) hypertension: Secondary | ICD-10-CM | POA: Insufficient documentation

## 2014-12-10 DIAGNOSIS — M1990 Unspecified osteoarthritis, unspecified site: Secondary | ICD-10-CM | POA: Insufficient documentation

## 2014-12-10 DIAGNOSIS — I509 Heart failure, unspecified: Secondary | ICD-10-CM | POA: Diagnosis not present

## 2014-12-10 DIAGNOSIS — Z85828 Personal history of other malignant neoplasm of skin: Secondary | ICD-10-CM | POA: Insufficient documentation

## 2014-12-10 DIAGNOSIS — N400 Enlarged prostate without lower urinary tract symptoms: Secondary | ICD-10-CM | POA: Diagnosis not present

## 2014-12-10 DIAGNOSIS — R634 Abnormal weight loss: Secondary | ICD-10-CM | POA: Insufficient documentation

## 2014-12-10 DIAGNOSIS — I420 Dilated cardiomyopathy: Secondary | ICD-10-CM | POA: Diagnosis not present

## 2014-12-10 DIAGNOSIS — E780 Pure hypercholesterolemia: Secondary | ICD-10-CM | POA: Insufficient documentation

## 2014-12-10 DIAGNOSIS — IMO0002 Reserved for concepts with insufficient information to code with codable children: Secondary | ICD-10-CM

## 2014-12-10 DIAGNOSIS — R590 Localized enlarged lymph nodes: Secondary | ICD-10-CM | POA: Insufficient documentation

## 2014-12-10 DIAGNOSIS — I2510 Atherosclerotic heart disease of native coronary artery without angina pectoris: Secondary | ICD-10-CM | POA: Insufficient documentation

## 2014-12-10 DIAGNOSIS — C01 Malignant neoplasm of base of tongue: Secondary | ICD-10-CM | POA: Insufficient documentation

## 2014-12-10 LAB — GLUCOSE, CAPILLARY: Glucose-Capillary: 98 mg/dL (ref 70–99)

## 2014-12-10 MED ORDER — FLUDEOXYGLUCOSE F - 18 (FDG) INJECTION
9.9000 | Freq: Once | INTRAVENOUS | Status: AC | PRN
  Administered 2014-12-10: 9.9 via INTRAVENOUS

## 2014-12-10 NOTE — Telephone Encounter (Signed)
Returned patient's VM.  Provided clarification of [facility] location for today's PET.  Confirmed an arrival time of 11:45 in Radiation Waiting for tomorrow's [place], explained arrival location and procedure.  Gayleen Orem, RN, BSN, [place] at [place] [phone]

## 2014-12-10 NOTE — Progress Notes (Signed)
Head and Neck Cancer Location of Tumor / Histology: base of tongue  Patient presented 1 months ago with symptoms of: palpable lymph node on right side of his neck since Dec 2015, received 2 courses antibiotic therapy without improvement  Biopsies of  (if applicable) revealed:  1/61/09 Right neck biopsy Diagnosis MALIGNANT CELLS PRESENT SEE COMMENT COMMENT: THERE ARE MALIGNANT CELLS PRESENT THAT ARE CONSISTENT WITH SQUAMOUS CELL CARCINOMA. THE CASE WAS REVIEWED BY DR Avis Epley, WHO CONCURS.  Nutrition Status Yes No Comments  Weight changes? []  [x]    Swallowing concerns? []  [x]    PEG? []  []  possible   Referrals Yes No Comments  Social Work? [x]  []    Dentistry? [x]     Swallowing therapy? [x]  []  Will see in [facility] [place]  Nutrition? [x]  []  Will see in [facility]  Med/Onc? [x]  []  Dr Alvy Bimler, Will see in [facility] [place]   Safety Issues Yes No Comments  Prior radiation? []  [x]    Pacemaker/ICD? []  [x]    Possible current pregnancy? []  [x]    Is the patient on methotrexate? []  [x]     Tobacco/Marijuana/Snuff/ETOH use: nonsmoker, denies significant alcohol  Past/Anticipated interventions by otolaryngology, if any: Dr Janace Hoard: biopsy  Past/Anticipated interventions by medical oncology, if any: Dr Alvy Bimler: Treatment options would include chemotherapy only, radiation only or chemotherapy in combination with radiation therapy  Current Complaints / other details:

## 2014-12-11 ENCOUNTER — Ambulatory Visit: Payer: Medicare Other | Admitting: Nutrition

## 2014-12-11 ENCOUNTER — Encounter: Payer: Self-pay | Admitting: *Deleted

## 2014-12-11 ENCOUNTER — Ambulatory Visit
Admission: RE | Admit: 2014-12-11 | Discharge: 2014-12-11 | Disposition: A | Discharge status: Home / Self Care | Payer: Medicare Other | Source: Ambulatory Visit | Attending: Radiation Oncology | Admitting: Radiation Oncology

## 2014-12-11 ENCOUNTER — Ambulatory Visit: Payer: Medicare Other | Attending: Radiation Oncology | Admitting: Physical Therapy

## 2014-12-11 ENCOUNTER — Encounter: Payer: Self-pay | Admitting: Hematology and Oncology

## 2014-12-11 ENCOUNTER — Ambulatory Visit (HOSPITAL_BASED_OUTPATIENT_CLINIC_OR_DEPARTMENT_OTHER): Payer: Medicare Other | Admitting: Hematology and Oncology

## 2014-12-11 ENCOUNTER — Encounter: Payer: Self-pay | Admitting: Radiation Oncology

## 2014-12-11 VITALS — BP 134/78 | HR 76 | Temp 97.8°F | Resp 20 | Ht 69.0 in | Wt 185.0 lb

## 2014-12-11 DIAGNOSIS — Z9189 Other specified personal risk factors, not elsewhere classified: Secondary | ICD-10-CM | POA: Diagnosis present

## 2014-12-11 DIAGNOSIS — R634 Abnormal weight loss: Secondary | ICD-10-CM

## 2014-12-11 DIAGNOSIS — C770 Secondary and unspecified malignant neoplasm of lymph nodes of head, face and neck: Secondary | ICD-10-CM

## 2014-12-11 DIAGNOSIS — C01 Malignant neoplasm of base of tongue: Secondary | ICD-10-CM | POA: Diagnosis not present

## 2014-12-11 HISTORY — DX: Contact with and (suspected) exposure to asbestos: Z77.090

## 2014-12-11 HISTORY — DX: Unspecified malignant neoplasm of skin, unspecified: C44.90

## 2014-12-11 MED ORDER — LORAZEPAM 0.5 MG PO TABS: ORAL_TABLET | ORAL | Status: DC

## 2014-12-11 NOTE — Progress Notes (Signed)
74 year old male diagnosed with Tongue cancer.  He is a patient of Dr. Isidore Moos receiving radiation only.  PMH includes HTN, Hypercholesterolemia, Diverticulosis, CHF, BPH.  Medications include saw palmetto and zocor.  Labs were reviewed.  Height: 69 inches. Weight:185 pounds. UBW: 190 pounds. BMI: 27.31. (overweight)  Met with patient, wife and daughter. Patient denies nutrition impact symptoms at presentation. No weight changes per patient.  Nutrition Diagnosis: Predicted sub-optimal intake related to new diagnosis of Tongue cancer and associated treatments as evidenced by a condition for which research shows inadequate oral intake.  Intervention: Educated patient on importance of small, frequent meals and snacks consisting of increased protein and calories. Fact sheet provided. Educated patient on importance of minimal weight loss.  Encouraged increased hydration. Encouraged medication compliance. Questions answered and teach back method used.  Monitoring, evaluation, goals: Patient will continue oral intake to promote weight maintenance.  Next Visit:  To be scheduled.

## 2014-12-11 NOTE — Progress Notes (Signed)
[facility] OFFICE PROGRESS NOTE  Patient Care Team: Laurey Morale, MD as PCP - General (Family Medicine) Heath Lark, MD as Consulting Physician (Hematology and Oncology)  SUMMARY OF ONCOLOGIC HISTORY:   Cancer of base of tongue   11/20/2014 Pathology Results Accession: [id number] confirm squamous cell carcinoma.   11/20/2014 Procedure The patient underwent fine-needle aspirate of the right lymph node   12/02/2014 Imaging CT scan showed 2.2 cm the right tongue base mass with necrotic 4 cm right level II nodal metastasis with multiple bilateral small lymphadenopathy   12/10/2014 Imaging PET CT scan showed tongue base mass with right level II lymph node metastasis    INTERVAL HISTORY: Please see below for problem oriented charting.  he returns to review test results. He has no new symptoms.  REVIEW OF SYSTEMS:   Constitutional: Denies fevers, chills or abnormal weight loss Eyes: Denies blurriness of vision Ears, nose, mouth, throat, and face: Denies mucositis or sore throat Respiratory: Denies cough, dyspnea or wheezes Cardiovascular: Denies palpitation, chest discomfort or lower extremity swelling Gastrointestinal:  Denies nausea, heartburn or change in bowel habits Skin: Denies abnormal skin rashes Lymphatics: Denies new lymphadenopathy or easy bruising Neurological:Denies numbness, tingling or new weaknesses Behavioral/Psych: Mood is stable, no new changes  All other systems were reviewed with the patient and are negative.  I have reviewed the past medical history, past surgical history, social history and family history with the patient and they are unchanged from previous note.  ALLERGIES:  has No Known Allergies.  MEDICATIONS:  Current Outpatient Prescriptions  Medication Sig Dispense Refill  . ASPIRIN PO Take 81 mg by mouth daily.     . carvedilol (COREG) 25 MG tablet Take 1 tablet (25 mg total) by mouth 2 (two) times daily with a meal. 180 tablet 3  .  diclofenac (VOLTAREN) 75 MG EC tablet Take 1 tablet (75 mg total) by mouth 2 (two) times daily. 180 tablet 3  . hydrocortisone (PROCTOZONE-HC) 2.5 % rectal cream Place rectally as needed. 90 g 3  . LORazepam (ATIVAN) 0.5 MG tablet Take 1-2 tablets PRN claustrophobia; take 20 minutes before wearing radiotherapy mask. 36 tablet 0  . multivitamin (THERAGRAN) per tablet Take 1 tablet by mouth daily.      . quinapril (ACCUPRIL) 20 MG tablet Take 1 tablet (20 mg total) by mouth at bedtime. 90 tablet 3  . saw palmetto 160 MG capsule Take 1 capsule (160 mg total) by mouth daily. 30 capsule 0  . simvastatin (ZOCOR) 40 MG tablet Take 1 tablet (40 mg total) by mouth at bedtime. 90 tablet 3  . tadalafil (CIALIS) 20 MG tablet Take 1 tablet (20 mg total) by mouth daily as needed for erectile dysfunction. 30 tablet 3  . TRAVATAN Z 0.004 % SOLN ophthalmic solution      No current facility-administered medications for this visit.    PHYSICAL EXAMINATION: ECOG PERFORMANCE STATUS: 0 GENERAL:alert, no distress and comfortable SKIN: skin color, texture, turgor are normal, no rashes or significant lesions Musculoskeletal:no cyanosis of digits and no clubbing  NEURO: alert & oriented x 3 with fluent speech, no focal motor/sensory deficits  LABORATORY DATA:  I have reviewed the data as listed    Component Value Date/Time   NA 140 09/09/2014 0838   K 3.9 09/09/2014 0838   CL 108 09/09/2014 0838   CO2 24 09/09/2014 0838   GLUCOSE 102* 09/09/2014 0838   BUN 19 09/09/2014 0838   CREATININE 1.1 09/09/2014 4132  CALCIUM 9.0 09/09/2014 0838   PROT 6.4 09/09/2014 0838   ALBUMIN 3.9 09/09/2014 0838   AST 30 09/09/2014 0838   ALT 41 09/09/2014 0838   ALKPHOS 75 09/09/2014 0838   BILITOT 0.5 09/09/2014 0838   GFRNONAA 61.82 08/04/2010 0855   GFRAA  09/11/2009 1230    >60        The eGFR has been calculated using the MDRD equation. This calculation has not been validated in all clinical situations. eGFR's  persistently <60 mL/min signify possible Chronic Kidney Disease.    No results found for: SPEP, UPEP  Lab Results  Component Value Date   WBC 7.7 09/09/2014   NEUTROABS 4.8 09/09/2014   HGB 15.5 09/09/2014   HCT 48.1 09/09/2014   MCV 88.2 09/09/2014   PLT 230.0 09/09/2014      Chemistry      Component Value Date/Time   NA 140 09/09/2014 0838   K 3.9 09/09/2014 0838   CL 108 09/09/2014 0838   CO2 24 09/09/2014 0838   BUN 19 09/09/2014 0838   CREATININE 1.1 09/09/2014 0838      Component Value Date/Time   CALCIUM 9.0 09/09/2014 0838   ALKPHOS 75 09/09/2014 0838   AST 30 09/09/2014 0838   ALT 41 09/09/2014 0838   BILITOT 0.5 09/09/2014 0838       RADIOGRAPHIC STUDIES: I have personally reviewed the radiological images as listed and agreed with the findings in the report. Nm Pet Image Initial (pi) Skull Base To Thigh  12/10/2014   CLINICAL DATA:  Initial treatment strategy for oropharyngeal squamous cell carcinoma.  EXAM: NUCLEAR MEDICINE PET SKULL BASE TO THIGH  TECHNIQUE: 9.9 mCi F-18 FDG was injected intravenously. Full-ring PET imaging was performed from the skull base to thigh after the radiotracer. CT data was obtained and used for attenuation correction and anatomic localization.  FASTING BLOOD GLUCOSE:  Value: 98 mg/dl  COMPARISON:  Neck CT 12/02/2014. CT of the abdomen and pelvis 09/11/2009.  FINDINGS: NECK  Asymmetric soft tissue thickening in the right side of the tongue base (better demonstrated on recent CT of the neck 12/02/2014) demonstrates extensive hypermetabolism (SUVmax = 10.5). There is also an enlarged hypermetabolic (SUVmax = 03.4) right-sided level 2 lymph node measuring 22 x 24 mm. No other hypermetabolic lymphadenopathy in the neck.  CHEST  Small foci of hypermetabolism in the right hilar region (SUVmax = 4.6)and in the subcarinal region (SUVmax = 6.1), without corresponding enlarged lymph nodes on the CT portion of the examination. Several calcified  mediastinal lymph nodes are noted. No suspicious pulmonary nodules on the CT scan. No suspicious appearing pulmonary nodules or masses. No acute consolidative airspace disease no pleural effusions. There is atherosclerosis of the thoracic aorta, the great vessels of the mediastinum and the coronary arteries, including calcified atherosclerotic plaque in the left main, left anterior descending, left circumflex and right coronary arteries. Heart size is mildly enlarged. There is no significant pericardial fluid, thickening or pericardial calcification.  ABDOMEN/PELVIS  No abnormal hypermetabolic activity within the liver, pancreas, adrenal glands, or spleen. No hypermetabolic lymph nodes in the abdomen or pelvis. Several small low-attenuation liver lesions are noted, measuring up to 1.3 cm, incompletely characterized on today's noncontrast CT examination (but demonstrating no hypermetabolism). Normal appendix. Small focus of hypermetabolism in the peripheral zone of the prostate gland (SUVmax = 5.1). Benign appearing focus of calcification in the upper right retroperitoneum immediately posterior to the right lobe of the liver, presumably an area of fat necrosis.  SKELETON  No focal hypermetabolic activity to suggest skeletal metastasis.  IMPRESSION: 1. Hypermetabolism in the area of soft tissue thickening at the base of the tongue on the right side, corresponding to the patient's known primary squamous cell carcinoma. There is also a malignant right-sided level 2 lymph node metastasis measuring approximately 2.2 x 2.4 cm. No definite signs of metastatic disease elsewhere noted in the neck, chest, abdomen or pelvis. 2. There are 2 foci of low-level hypermetabolism in the right hilar and subcarinal areas, without corresponding abnormality on the CT portion of the examination. Given the presence of several calcified mediastinal lymph nodes, this is favored to be inflammatory related to granulomatous disease, but  attention on future followup examinations is recommended. 3. Small focus of hypermetabolism (SUVmax = 5.1) in the peripheral zone of the left side of the prostate gland. Correlation with PSA levels and physical examination is recommended to exclude the possibility of prostate cancer. 4. Atherosclerosis, including left main and 3 vessel coronary artery disease. Assessment for potential risk factor modification, dietary therapy or pharmacologic therapy may be warranted, if clinically indicated. 5. Additional incidental findings, as above.   Electronically Signed   By: Vinnie Langton M.D.   On: 12/10/2014 14:42     ASSESSMENT & PLAN:  Cancer of base of tongue We have extensive discussion at tumor board today. HPV status on his recent biopsy was pending. If HPV status came back positive, overall consensus would be to proceed with radiation therapy alone. However, if HPV tested negative, we are in favor of pursuing concurrent chemoradiation therapy with weekly cisplatin. I will follow on the result next week and will call the patient once the results is ready. The patient is encouraged by the good news on the pet imaging study.       All questions were answered. The patient knows to call the clinic with any problems, questions or concerns. No barriers to learning was detected. I spent 15 minutes counseling the patient face to face. The total time spent in the appointment was 20 minutes and more than 50% was on counseling and review of test results     [facility], Bonnie, MD 12/11/2014 4:27 PM   .

## 2014-12-11 NOTE — Progress Notes (Signed)
Head & Neck Multidisciplinary Clinic Clinical Social Work  Clinical Social Work met with patient/family at head & neck multidisciplinary clinic to offer support and assess for psychosocial needs.  Nicholas Wilkerson was accompanied by his spouse and daughter.  He met with medical oncologist prior to [name] visit- patient reports feeling relieved he may need radiation only.  His main concern is his nutrition after learning of possible side effects of fatigue, swallowing, and loss of appetite.  [name] shared that was currently her concern as well, she shared she "hopes to do everything in her power" to help patient to avoid needing feeding tube.  The patient shared he is a very active person and CSW discussed ways patient may continue to be active, but also accommodate activities if he feels fatigued during treatment.  The patient scored a 0 on the Psychosocial Distress Thermometer which indicates no distress. He shared his only concern listed was lack of information about diagnosis and treatment, but he is no longer concerned after meeting with medical oncologist.  [name] 12/11/2014  Screening Type Initial Screening  Distress experienced in past week (1-10) 0  Information Concerns Type Lack of info about diagnosis;Lack of info about treatment  Referral to clinical social work Yes   Clinical Social Work briefly discussed Clinical Social Work role and [employer] support programs/services.  Clinical Social Work encouraged patient to call with any additional questions or concerns.   Polo Riley, MSW, LCSW, OSW-C Clinical Social Worker [facility] [phone]

## 2014-12-11 NOTE — Progress Notes (Signed)
Radiation Oncology         [phone] ________________________________  Initial outpatient Consultation  Name: Nicholas Wilkerson MRN: [id number]  Date: 12/11/2014  DOB: [birth year]  CC:FRY,STEPHEN A, MD  Laurey Morale, MD   REFERRING PHYSICIAN: Laurey Morale, MD  DIAGNOSIS: [id number] Stage IVa Right Base of Tongue Squamous Cell Carcinoma      ICD-9-CM ICD-10-CM   1. Cancer of base of tongue 141.0 C01 LORazepam (ATIVAN) 0.5 MG tablet     Ambulatory Referral to Dentistry (specifically to Dr. Enrique Sack)     Ambulatory Referral to Speech Therapy  (specifically to [facility])     TSH  2. Loss of weight 783.21 R63.4 TSH    HISTORY OF PRESENT ILLNESS::Nicholas Wilkerson is a 74 y.o. male who presented with a lump in his right neck in Dec 2015 that did not improve with ABX.  He saw ENT (Dr Janace Hoard) and biopsy revealed cell consistent with squamous cell carcinoma on 11-20-14.  After consult, addendum revealed p16 negativity.    CT on 2-22 revealed a 2.2cm BOT mass (right) and right level II nodal metastasis that is almost 4cm. PET on 3-1 revealed PET positivity in these areas with other nonspecific findings but no clear signs of distant metastases.  He reports a little bit of dry mouth and mild weight loss.  No dysphagia or sore throat.    Denies tobacco abuse or significant alcohol consumption.  PREVIOUS RADIATION THERAPY: No  PAST MEDICAL HISTORY:  has a past medical history of Hypertension; Dilated cardiomyopathy; Hypercholesterolemia; DIVERTICULOSIS, COLON (12/08/2007); ARTHRITIS (10/22/2008); History of echocardiogram (05/22/2007); CHF (congestive heart failure); BPH (benign prostatic hyperplasia); Squamous cell carcinoma (12/02/2014); Squamous cell carcinoma (11/20/14); Cancer (11/20/14); Skin cancer; and H/O asbestos exposure.    PAST SURGICAL HISTORY: Past Surgical History  Procedure Laterality Date  . Cardiac catheterization  10/17/2001    EF estimated at 30% / moderate LV enlargement  /  1. Minimal nonobstructive atherosclerotic coronary artery disease / 2. Severe LV dysfunction with global hypokinesia consistent with dilated nonischemic cardiomyopath / 3. Moderate pulmonary hypertension  . Colonoscopy  08-15-07    per Dr. Carlean Purl, clear , repeat in 10 yrs  . Lymph node biopsy      FAMILY HISTORY: family history includes Angina in his mother; Cancer in his father; Stroke in his father.  SOCIAL HISTORY:  reports that he has never smoked. He has never used smokeless tobacco. He reports that he drinks alcohol. He reports that he does not use illicit drugs.  ALLERGIES: Review of patient's allergies indicates no known allergies.  MEDICATIONS:  Current Outpatient Prescriptions  Medication Sig Dispense Refill  . ASPIRIN PO Take 81 mg by mouth daily.     . carvedilol (COREG) 25 MG tablet Take 1 tablet (25 mg total) by mouth 2 (two) times daily with a meal. 180 tablet 3  . diclofenac (VOLTAREN) 75 MG EC tablet Take 1 tablet (75 mg total) by mouth 2 (two) times daily. 180 tablet 3  . hydrocortisone (PROCTOZONE-HC) 2.5 % rectal cream Place rectally as needed. 90 g 3  . multivitamin (THERAGRAN) per tablet Take 1 tablet by mouth daily.      . quinapril (ACCUPRIL) 20 MG tablet Take 1 tablet (20 mg total) by mouth at bedtime. 90 tablet 3  . saw palmetto 160 MG capsule Take 1 capsule (160 mg total) by mouth daily. 30 capsule 0  . simvastatin (ZOCOR) 40 MG tablet Take 1 tablet (40 mg total) by mouth  at bedtime. 90 tablet 3  . tadalafil (CIALIS) 20 MG tablet Take 1 tablet (20 mg total) by mouth daily as needed for erectile dysfunction. 30 tablet 3  . TRAVATAN Z 0.004 % SOLN ophthalmic solution     . LORazepam (ATIVAN) 0.5 MG tablet Take 1-2 tablets PRN claustrophobia; take 20 minutes before wearing radiotherapy mask. 36 tablet 0   No current facility-administered medications for this encounter.    REVIEW OF SYSTEMS:  Notable for that above.   PHYSICAL EXAM:  height is 5' 9"  (1.753 m)  and weight is 185 lb (83.915 kg). His oral temperature is 97.8 F (36.6 C). His blood pressure is 134/78 and his pulse is 76. His respiration is 20.   General: Alert and oriented, in no acute distress HEENT: Head is normocephalic. Extraocular movements are intact. Proximal Oropharynx is clear. Neck: + palpable right upper neck node   Heart: Regular in rate and rhythm with no murmurs, rubs, or gallops. Chest: Clear to auscultation bilaterally, with no rhonchi, wheezes, or rales. Abdomen: Soft, nontender, nondistended, with no rigidity or guarding. Extremities: No cyanosis or edema. Lymphatics: see Neck Exam Skin: No concerning lesions. Musculoskeletal: symmetric strength and muscle tone throughout. Neurologic: Cranial nerves II through XII are grossly intact. No obvious focalities. Speech is fluent. Coordination is intact. Psychiatric: Judgment and insight are intact. Affect is appropriate.   ECOG = 0  0 - Asymptomatic (Fully active, able to carry on all predisease activities without restriction)  1 - Symptomatic but completely ambulatory (Restricted in physically strenuous activity but ambulatory and able to carry out work of a light or sedentary nature. For example, light housework, office work)  2 - Symptomatic, <50% in bed during the day (Ambulatory and capable of all self care but unable to carry out any work activities. Up and about more than 50% of waking hours)  3 - Symptomatic, >50% in bed, but not bedbound (Capable of only limited self-care, confined to bed or chair 50% or more of waking hours)  4 - Bedbound (Completely disabled. Cannot carry on any self-care. Totally confined to bed or chair)  5 - Death   Eustace Pen MM, Creech RH, Tormey DC, et al. [phone]). "Toxicity and response criteria of the [facility]". [place] Oncol. 5 (6): 649-55   LABORATORY DATA:  Lab Results  Component Value Date   WBC 7.7 09/09/2014   HGB 15.5 09/09/2014   HCT 48.1  09/09/2014   MCV 88.2 09/09/2014   PLT 230.0 09/09/2014   CMP     Component Value Date/Time   NA 140 09/09/2014 0838   K 3.9 09/09/2014 0838   CL 108 09/09/2014 0838   CO2 24 09/09/2014 0838   GLUCOSE 102* 09/09/2014 0838   BUN 19 09/09/2014 0838   CREATININE 1.1 09/09/2014 0838   CALCIUM 9.0 09/09/2014 0838   PROT 6.4 09/09/2014 0838   ALBUMIN 3.9 09/09/2014 0838   AST 30 09/09/2014 0838   ALT 41 09/09/2014 0838   ALKPHOS 75 09/09/2014 0838   BILITOT 0.5 09/09/2014 0838   GFRNONAA 61.82 08/04/2010 0855   GFRAA  09/11/2009 1230    >60        The eGFR has been calculated using the MDRD equation. This calculation has not been validated in all clinical situations. eGFR's persistently <60 mL/min signify possible Chronic Kidney Disease.         RADIOGRAPHY: Ct Soft Tissue Neck W Contrast  12/02/2014   CLINICAL DATA:  Lymphadenopathy.  Lump on right side of neck since 09/2014. History of basal and squamous cell carcinomas of the skin of the face and head status post resection.  EXAM: CT NECK WITH CONTRAST  TECHNIQUE: Multidetector CT imaging of the neck was performed using the standard protocol following the bolus administration of intravenous contrast.  CONTRAST:  23m OMNIPAQUE IOHEXOL 300 MG/ML  SOLN  COMPARISON:  Cervical spine CT 09/11/2009  FINDINGS: Pharynx and larynx: The nasopharynx is unremarkable. There is an enhancing mass involving the right tongue base protruding into the vallecula which measures approximately 10 x 7 x 22 mm (transverse by AP by craniocaudal). This appears to extend laterally to the right lateral oropharyngeal wall and does not appear to involve the epiglottis. The larynx is unremarkable.  Salivary glands: The submandibular and parotid glands are unremarkable.  Thyroid: Unremarkable.  Lymph nodes: An nenlarged, partially necrotic right level IIA lymph node measures 2.6 x 2.4 x 4.0 cm. Immediately inferior to this in level III is a small but asymmetric  lymph node measuring 5 mm in short axis, possibly with some subtle necrosis (series 3, image 46). A small but asymmetric right level III lymph node more posteriorly measures 4 mm (series 3, image 45). A slightly asymmetric right level IIB lymph node measures 6 mm (series 3, image 34). A small but asymmetric left level III lymph node measures 6 mm (series 3, image 50). Multiple subcentimeter lymph nodes are partially visualized in the mediastinum.  Vascular: Major vascular structures of the appear patent. Mild atherosclerosis is noted at the carotid bifurcations.  Limited intracranial: The visualized portion of the brain is unremarkable.  Visualized orbits: Unremarkable.  Mastoids and visualized paranasal sinuses: Clear.  Skeleton: Mild cervical spondylosis. No suspicious lytic or blastic osseous lesions identified.  Upper chest: Visualized lung apices are clear.  IMPRESSION: 2.2 cm right tongue base mass concerning for malignancy. Necrotic right level II nodal metastasis. Subcentimeter right level II and bilateral level III lymph nodes are indeterminate.   Electronically Signed   By: ALogan Bores  On: 12/02/2014 16:47   Nm Pet Image Initial (pi) Skull Base To Thigh  12/10/2014   CLINICAL DATA:  Initial treatment strategy for oropharyngeal squamous cell carcinoma.  EXAM: NUCLEAR MEDICINE PET SKULL BASE TO THIGH  TECHNIQUE: 9.9 mCi F-18 FDG was injected intravenously. Full-ring PET imaging was performed from the skull base to thigh after the radiotracer. CT data was obtained and used for attenuation correction and anatomic localization.  FASTING BLOOD GLUCOSE:  Value: 98 mg/dl  COMPARISON:  Neck CT 12/02/2014. CT of the abdomen and pelvis 09/11/2009.  FINDINGS: NECK  Asymmetric soft tissue thickening in the right side of the tongue base (better demonstrated on recent CT of the neck 12/02/2014) demonstrates extensive hypermetabolism (SUVmax = 10.5). There is also an enlarged hypermetabolic (SUVmax = 150.9  right-sided level 2 lymph node measuring 22 x 24 mm. No other hypermetabolic lymphadenopathy in the neck.  CHEST  Small foci of hypermetabolism in the right hilar region (SUVmax = 4.6)and in the subcarinal region (SUVmax = 6.1), without corresponding enlarged lymph nodes on the CT portion of the examination. Several calcified mediastinal lymph nodes are noted. No suspicious pulmonary nodules on the CT scan. No suspicious appearing pulmonary nodules or masses. No acute consolidative airspace disease no pleural effusions. There is atherosclerosis of the thoracic aorta, the great vessels of the mediastinum and the coronary arteries, including calcified atherosclerotic plaque in the left main, left anterior descending, left circumflex and right  coronary arteries. Heart size is mildly enlarged. There is no significant pericardial fluid, thickening or pericardial calcification.  ABDOMEN/PELVIS  No abnormal hypermetabolic activity within the liver, pancreas, adrenal glands, or spleen. No hypermetabolic lymph nodes in the abdomen or pelvis. Several small low-attenuation liver lesions are noted, measuring up to 1.3 cm, incompletely characterized on today's noncontrast CT examination (but demonstrating no hypermetabolism). Normal appendix. Small focus of hypermetabolism in the peripheral zone of the prostate gland (SUVmax = 5.1). Benign appearing focus of calcification in the upper right retroperitoneum immediately posterior to the right lobe of the liver, presumably an area of fat necrosis.  SKELETON  No focal hypermetabolic activity to suggest skeletal metastasis.  IMPRESSION: 1. Hypermetabolism in the area of soft tissue thickening at the base of the tongue on the right side, corresponding to the patient's known primary squamous cell carcinoma. There is also a malignant right-sided level 2 lymph node metastasis measuring approximately 2.2 x 2.4 cm. No definite signs of metastatic disease elsewhere noted in the neck, chest,  abdomen or pelvis. 2. There are 2 foci of low-level hypermetabolism in the right hilar and subcarinal areas, without corresponding abnormality on the CT portion of the examination. Given the presence of several calcified mediastinal lymph nodes, this is favored to be inflammatory related to granulomatous disease, but attention on future followup examinations is recommended. 3. Small focus of hypermetabolism (SUVmax = 5.1) in the peripheral zone of the left side of the prostate gland. Correlation with PSA levels and physical examination is recommended to exclude the possibility of prostate cancer. 4. Atherosclerosis, including left main and 3 vessel coronary artery disease. Assessment for potential risk factor modification, dietary therapy or pharmacologic therapy may be warranted, if clinically indicated. 5. Additional incidental findings, as above.   Electronically Signed   By: Vinnie Langton M.D.   On: 12/10/2014 14:42      IMPRESSION/PLAN: This is a delightful 74 year old man with stage IVA squamous cell carcinoma of the right BOT; HPV status negative, no ETOH abuse /  no smoking history. The patient is a good candidate for radiotherapy. The patient has been discussed in detail at tumor board and seen in the context of multidisciplinary clinic today. Plan is as below:   1) The patient has met with med/onc to discuss chemotherapy - anticipate possible concurrent ChRT only if HPV negative (and he is, per post-consult pathology addendum)  1a) PET had nonspecific findings.  Will obtain f/u imaging post treatment.  Prostate findings do not warrant workup right now in light of more urgent issues.   2) Referral has been made to dentistry for dental evaluation/extractions/education in preparation for radiation in the vicinity of the mouth   3) Today in multidisciplinary clinic the patient will see Polo Riley from social work for social support  4) Today in multidisciplinary clinic he will see nutrition  for nutrition support - do anticipate PEG tube if systemic therapy is given  5) Medical Oncology will  eventually refer to surgery for PEG tube placement if needed  6) Will refer to swallowing therapy for evaluation and prophylactic treatment as needed for dysphagia, which can worsen during or after chemoradiotherapy.   7) Physical therapist will see patient today in [facility] clinic for neck measurements due to risk of lymphedema in neck; may benefit from PT for this after completion of radiotherapy. The patient also may benefit from PT for potentional deconditioning after treatment    8) Simulation once cleared by dentistry. Anticipate 7 weeks of RT -  70 Gy in 35 fractions.   9) Baseline TSH lab due to risk of hypothyroidism from RT     It was a pleasure meeting the patient today. We discussed the risks, benefits, and side effects of radiotherapy.   We talked in detail about acute and late effects. He understands that some of the most bothersome acute effects will be significant soreness of the mouth and throat, changes in taste, changes in salivary function, skin irritation, hair loss, dehydration, weight loss and fatigue. We talked about late effects which include but are not necessarily limited to dysphagia, hypothyroidism, dry mouth, trismus, neck edema and nerve or spinal cord injury. No guarantees of treatment were given. A consent form was signed and placed in the patient's medical record. The patient is enthusiastic about proceeding with treatment. I look forward to participating in the patient's care.   __________________________________________   Eppie Gibson, MD

## 2014-12-11 NOTE — Therapy (Signed)
[place], [place], [address] Phone: [phone]   Fax:  [phone]  Physical Therapy Evaluation  Patient Details  Name: Nicholas Wilkerson MRN: [id number] Date of Birth: 03-05-1941 Referring Provider:  Eppie Gibson, MD  Encounter Date: 12/11/2014      PT End of Session - 12/11/14 1415    Visit Number 1   Number of Visits 1   Date for PT Re-Evaluation 02/09/15   PT Start Time 1320   PT Stop Time 1345   PT Time Calculation (min) 25 min   Activity Tolerance Patient tolerated treatment well   Behavior During Therapy [facility] for tasks assessed/performed      Past Medical History  Diagnosis Date  . Hypertension   . Dilated cardiomyopathy   . Hypercholesterolemia   . DIVERTICULOSIS, COLON 12/08/2007  . ARTHRITIS 10/22/2008  . History of echocardiogram 05/22/2007    EF was 45-50% / Mild concentric LV hypertrophy with mild global hypokinesis and overall mild systolic dysfunction .  Mild AV sclerosis / Mild Mitral insufficiency / compared to prior study 04/24/02 -- LV function has improved further.    . CHF (congestive heart failure)     sees Dr. Peter [name]   . BPH (benign prostatic hyperplasia)   . Squamous cell carcinoma 12/02/2014  . Squamous cell carcinoma 11/20/14    base of tongue primary  . Cancer 11/20/14    base of tongue  . Skin cancer     squamous cell, basal cell  . H/O asbestos exposure     Past Surgical History  Procedure Laterality Date  . Cardiac catheterization  10/17/2001    EF estimated at 30% / moderate LV enlargement  / 1. Minimal nonobstructive atherosclerotic coronary artery disease / 2. Severe LV dysfunction with global hypokinesia consistent with dilated nonischemic cardiomyopath / 3. Moderate pulmonary hypertension  . Colonoscopy  08-15-07    per Dr. Carlean Purl, clear , repeat in 10 yrs  . Lymph node biopsy      There were no vitals taken for this visit.  Visit Diagnosis:  At  risk for lymphedema - Plan: PT plan of care cert/re-cert      Subjective Assessment - 12/11/14 1407    Symptoms Pt. presented a month ago with palpable lymph node on right side of neck since December 2015; no improvement with 2 courses of antibiotics.     Pertinent History Right neck needle biopsy 11/20/14 showed squamous cell carcinoma; tumor at base of tongue.  Patient expects to receive radiation treatment only.   Currently in Pain? No/denies          [facility] PT Assessment - 12/11/14 0001    Assessment   Medical Diagnosis squamous cell carcinoma at base of tongue   Precautions   Precautions Other (comment)   Precaution Comments cancer precautions   Restrictions   Weight Bearing Restrictions No   Balance Screen   Has the patient fallen in the past 6 months No   Has the patient had a decrease in activity level because of a fear of falling?  No   Home Environment   Living Enviornment Private residence   Living Arrangements Spouse/significant other   Type of [place] Two level   Prior Function   Level of Independence Independent with basic ADLs;Independent with homemaking with ambulation;Independent with gait   Leisure enjoys yardwork, golf, lawn  maintenance, walking, grandchildren   Observation/Other Assessments   Skin Integrity intact; bulge  at right side of neck   Functional Tests   Functional tests Sit to Stand   Sit to Stand   Comments 13 times in 30 seconds   Posture/Postural Control   Posture/Postural Control No significant limitations   ROM / Strength   AROM / PROM / Strength AROM;Strength   AROM   Overall AROM  Within functional limits for tasks performed   Overall AROM Comments neck and shoulder AROM assessed   Strength   Overall Strength Comments not tested, but functional; pt. reports being very active, including lifting grandchildren   Ambulation/Gait   Ambulation/Gait Yes   Ambulation/Gait Assistance 7: Independent            LYMPHEDEMA/ONCOLOGY QUESTIONNAIRE - 09-Jan-2015 1413    Type   Cancer Type base of tongue squamous cell   Lymphedema Assessments   Lymphedema Assessments Head and Neck   Head and Neck   8 cm superior to sternal notch around neck 39.5 cm   Other 37.9   Other 37                       PT Education - 01-09-2015 1415    Education provided Yes   Education Details neck ROM, posture, walking, lymphedema info   Person(s) Educated Patient;Spouse;Child(ren)   Methods Explanation;Handout   Comprehension Verbalized understanding                 Head and Neck Clinic Goals - 2015-01-09 1421    Patient will be able to verbalize understanding of a home exercise program for cervical range of motion, posture, and walking.    Status Achieved   Patient will be able to verbalize understanding of proper sitting and standing posture.    Status Achieved   Patient will be able to verbalize understanding of lymphedema risk and availability of treatment for this condition.    Status Achieved           Plan - 2015/01/09 1416    Clinical Impression Statement Pt. is currently active; given instruction for continuing activity, adding neck ROM, and lymphedema information, should that develop from radiation treatment to neck.   Pt will benefit from skilled therapeutic intervention in order to improve on the following deficits Decreased knowledge of precautions   Rehab Potential Excellent   PT Frequency One time visit   PT Treatment/Interventions Patient/family education   PT Next Visit Plan None at this time; reassess as needed should swelling or tightness develop as a result of radiation treatment to neck.   PT Home Exercise Plan neck ROM, walking   Consulted and Agree with Plan of Care Patient;Family member/caregiver          G-Codes - Jan 09, 2015 1421    Functional Assessment Tool Used clinical judgement   Functional Limitation Other PT primary   Other PT Primary  Current Status [phone]) At least 1 percent but less than 20 percent impaired, limited or restricted   Other PT Primary Goal Status (D6644) At least 1 percent but less than 20 percent impaired, limited or restricted       Problem List Patient Active Problem List   Diagnosis Date Noted  . History of skin cancer 12/03/2014  . Cancer of base of tongue 12/02/2014  . Chronic systolic CHF (congestive heart failure) 07/12/2013  . MUSCLE STRAIN, ABDOMINAL WALL 11/28/2008  . ARTHRITIS 10/22/2008  . BURSITIS, LEFT SHOULDER 07/24/2008  . INTERNAL HEMORRHOIDS 12/08/2007  . EXTERNAL HEMORRHOIDS 12/08/2007  . DIVERTICULOSIS, COLON  12/08/2007  . RECTAL BLEEDING 12/08/2007  . CONGESTIVE HEART FAILURE, HX OF 12/08/2007  . Congestive dilated cardiomyopathy 06/13/2007  . HX, PERSONAL, ARTHRITIS 06/13/2007  . Hypercholesterolemia 05/15/2007  . Essential hypertension 05/15/2007    SALISBURY,DONNA 12/11/2014, 2:25 PM  [place], [place], [address] Phone: [phone]   Fax:  [phone]  Serafina Royals, [place]

## 2014-12-12 ENCOUNTER — Encounter: Payer: Self-pay | Admitting: *Deleted

## 2014-12-12 NOTE — Progress Notes (Signed)
To provide support and care continuity, met with patient during H&N [place].  His wife and dtr accompanied him. 1. I further explained my role as his navigator. 2. Provided New Patient Information packet:  Contact information for physicians and navigator,  Advance Directive information ([place] blue pamphlet),  Fall Prevention Patient Safety Plan,   Appointment Guideline, and  [facility] campus map with highlight of [place]. 3. I explained location of Dental Medicine at [facility], arrival procedure for that future appt. 4. Following clinic, I showed them location of SIM and Tomo, explained SIM planning and Tomo tmt, explained arrival procedure for RadOnc appts, including Waiting Room monitor notification. 5. He understands he can contact me with questions/concerns.  Gayleen Orem, RN, BSN, [place] at [place] [phone]

## 2014-12-12 NOTE — Assessment & Plan Note (Signed)
We have extensive discussion at tumor board today. HPV status on his recent biopsy was pending. If HPV status came back positive, overall consensus would be to proceed with radiation therapy alone. However, if HPV tested negative, we are in favor of pursuing concurrent chemoradiation therapy with weekly cisplatin. I will follow on the result next week and will call the patient once the results is ready. The patient is encouraged by the good news on the pet imaging study.

## 2014-12-13 ENCOUNTER — Telehealth: Payer: Self-pay | Admitting: *Deleted

## 2014-12-13 ENCOUNTER — Telehealth: Payer: Self-pay | Admitting: Hematology and Oncology

## 2014-12-13 ENCOUNTER — Other Ambulatory Visit: Payer: Self-pay | Admitting: Hematology and Oncology

## 2014-12-13 DIAGNOSIS — C01 Malignant neoplasm of base of tongue: Secondary | ICD-10-CM

## 2014-12-13 NOTE — Telephone Encounter (Signed)
Returned patient wife's VM. 1. I addressed her concern's re: updated dx of Stage 4. 2. We reviewed upcoming appts for next week. 3. I indicated I would join them during the Wed. appt with Dr. Alvy Bimler. She expressed appreciation for my prompt return call.  Gayleen Orem, RN, BSN, [place] at [place] [phone]

## 2014-12-13 NOTE — Telephone Encounter (Signed)
I spoke with the patient over the telephone. I apologized that I staged him incorrectly. The patient states that stage IVa due to size of lymph nodes greater than 3 cm. HPV Testing came back negative. Overall, I felt that the patient would benefit from concurrent chemotherapy. I will replace order for port placement and feeding tube. I plan to see him back again next week to review plan of care.

## 2014-12-13 NOTE — Telephone Encounter (Signed)
CALLED PATIENT TO ASK ABOUT COMING IN FOR A LAB ON 12-16-14 @ 3 PM, LVM FOR A RETURN CALL

## 2014-12-13 NOTE — Telephone Encounter (Signed)
s.w pt and advised on March appt....pt ok adn aware of all

## 2014-12-16 ENCOUNTER — Ambulatory Visit
Admission: RE | Admit: 2014-12-16 | Discharge: 2014-12-16 | Disposition: A | Discharge status: Home / Self Care | Payer: Medicare Other | Source: Ambulatory Visit | Attending: Radiation Oncology | Admitting: Radiation Oncology

## 2014-12-16 ENCOUNTER — Ambulatory Visit: Payer: Medicare Other

## 2014-12-16 ENCOUNTER — Other Ambulatory Visit: Payer: Medicare Other

## 2014-12-16 ENCOUNTER — Telehealth: Payer: Self-pay | Admitting: Hematology and Oncology

## 2014-12-16 DIAGNOSIS — R634 Abnormal weight loss: Secondary | ICD-10-CM

## 2014-12-16 DIAGNOSIS — Z9189 Other specified personal risk factors, not elsewhere classified: Secondary | ICD-10-CM | POA: Diagnosis not present

## 2014-12-16 DIAGNOSIS — C01 Malignant neoplasm of base of tongue: Secondary | ICD-10-CM

## 2014-12-16 DIAGNOSIS — R1310 Dysphagia, unspecified: Secondary | ICD-10-CM

## 2014-12-16 NOTE — Progress Notes (Signed)
FMLA forms received from patient wife, I delivered to [place] in [employer].  Gayleen Orem, RN, BSN, [place] [facility] [place] [phone]

## 2014-12-16 NOTE — Therapy (Signed)
[facility] [address] [place], [place], [address] Phone: [phone]   Fax:  [phone]  Speech Language Pathology Evaluation  Patient Details  Name: Nicholas Wilkerson MRN: [id number] Date of Birth: 11-24-1940 Referring Provider:  Heath Lark, MD  Encounter Date: 12/16/2014      End of Session - 12/16/14 1619    Visit Number 1   Number of Visits 3   Date for SLP Re-Evaluation 02/14/15   Authorization Type no Josem Kaufmann req   SLP Start Time 1520   SLP Stop Time  1600   SLP Time Calculation (min) 40 min   Activity Tolerance Patient tolerated treatment well      Past Medical History  Diagnosis Date  . Hypertension   . Dilated cardiomyopathy   . Hypercholesterolemia   . DIVERTICULOSIS, COLON 12/08/2007  . ARTHRITIS 10/22/2008  . History of echocardiogram 05/22/2007    EF was 45-50% / Mild concentric LV hypertrophy with mild global hypokinesis and overall mild systolic dysfunction .  Mild AV sclerosis / Mild Mitral insufficiency / compared to prior study 04/24/02 -- LV function has improved further.    . CHF (congestive heart failure)     sees Dr. Peter [name]   . BPH (benign prostatic hyperplasia)   . Squamous cell carcinoma 12/02/2014  . Squamous cell carcinoma 11/20/14    base of tongue primary  . Cancer 11/20/14    base of tongue  . Skin cancer     squamous cell, basal cell  . H/O asbestos exposure     Past Surgical History  Procedure Laterality Date  . Cardiac catheterization  10/17/2001    EF estimated at 30% / moderate LV enlargement  / 1. Minimal nonobstructive atherosclerotic coronary artery disease / 2. Severe LV dysfunction with global hypokinesia consistent with dilated nonischemic cardiomyopath / 3. Moderate pulmonary hypertension  . Colonoscopy  08-15-07    per Dr. Carlean Purl, clear , repeat in 10 yrs  . Lymph node biopsy      There were no vitals taken for this visit.  Visit Diagnosis: Dysphagia -  Plan: SLP plan of care cert/re-cert      Subjective Assessment - 12/16/14 1530    Symptoms Pt does not report any difficulties with swallowing during meals.          SLP Evaluation [facility] - 12/16/14 1532    SLP Visit Information   Medical Diagnosis Tongue base cancer   Pain Assessment   Currently in Pain? No/denies   General Information   HPI Pt with diagnosis via PET/CT on 12-10-14. PEG inserted 12-24-14, rad and chemo start date unknown.      Pt currently tolerates regular diet and thin liquids. Oral motor assessment revealed WNL lingual ROM and WNL lingual strength. Labial ROM was WNL and strength was deemed WNL. Velar ROM appeared normal. POs: Pt ate ham sandwich and drank water without overt s/s aspiration. Thyroid elevation appeared appropriate, and swallows appeared timely. Oral residue WNL. Pt's swallow deemed WNL at this time.   Because data states the risk for dysphagia during and after radiation treatment is high due to undergoing radiation tx, SLP taught pt about the possibility of reduced/limited ability for PO intake during rad tx. SLP encouraged pt to continue swallowing POs as far into rad tx as possible, even ingesting POs and/or completing HEP shortly after administration of pain meds.   SLP educated pt re: changes to swallowing musculature after rad tx, and why adherence to dysphagia  HEP provided today and PO consumption was necessary to inhibit muscular disuse atrophy and to reduce muscle fibrosis following rad tx. Pt demonstrated understanding of these things to SLP.    After eval tasks, SLP then developed a HEP for pt and pt was instructed how to perform exercises involving lingual, vocal fold, and pharyngeal strengthening. SLP performed each exercise and pt return demonstrated each exercise. SLP ensured pt performance was correct prior to moving on to next exercise. Pt was instructed to complete this program 2-3 times a day, 6-7 days/week until 60 days after their last rad  tx, then x3 a week after that.         SLP Education - 12-21-2014 1605    Education provided Yes   Education Details Head/neck muscle fibrosis, muscle atrophy, HEP   Person(s) Educated Patient   Methods Explanation;Handout   Comprehension Verbalized understanding;Returned demonstration          SLP Short Term Goals - 21-Dec-2014 1626    SLP SHORT TERM GOAL #1   Title pt will complete HEP with rare min A   Time 1   Period --  visit   Status New   SLP SHORT TERM GOAL #2   Title pt will tell SLP why he is completing HEP   Time 1   Period --  visit   Status New          SLP Long Term Goals - 12/21/2014 1627    SLP LONG TERM GOAL #1   Title pt will complete HEP with modified independence   Time 2   Period --  visits   Status New   SLP LONG TERM GOAL #2   Title pt will tell SLP three signs/symptoms aspiratioin PNA with modified independence   Time 2   Period --  visits          Plan - 21-Dec-2014 1620    Clinical Impression Statement Pt presents with WNL swallowing at this time. However data suggest that the patient should complete a regimen of swallow exercises. Skilled ST is needed to assess successful procedure of this HEP and to assess safety with PO intake.   Speech Therapy Frequency --  approx every 4 weeks   Duration --  for 60 days   Treatment/Interventions Pharyngeal strengthening exercises;Oral motor exercises;Compensatory strategies;Patient/family education;SLP instruction and feedback   Potential to Achieve Goals Good   SLP Home Exercise Plan see "pt instructions"   Consulted and Agree with Plan of Care Patient          G-Codes - 12/21/14 1631    Functional Assessment Tool Used noms   Functional Limitations Swallowing   Swallow Current Status (M3846) 0 percent impaired, limited or restricted   Swallow Goal Status (K5993) At least 1 percent but less than 20 percent impaired, limited or restricted      Problem List Patient Active Problem List    Diagnosis Date Noted  . History of skin cancer 12/03/2014  . Cancer of base of tongue 12/02/2014  . Chronic systolic CHF (congestive heart failure) 07/12/2013  . MUSCLE STRAIN, ABDOMINAL WALL 11/28/2008  . ARTHRITIS 10/22/2008  . BURSITIS, LEFT SHOULDER 07/24/2008  . INTERNAL HEMORRHOIDS 12/08/2007  . EXTERNAL HEMORRHOIDS 12/08/2007  . DIVERTICULOSIS, COLON 12/08/2007  . RECTAL BLEEDING 12/08/2007  . CONGESTIVE HEART FAILURE, HX OF 12/08/2007  . Congestive dilated cardiomyopathy 06/13/2007  . HX, PERSONAL, ARTHRITIS 06/13/2007  . Hypercholesterolemia 05/15/2007  . Essential hypertension 05/15/2007    [facility], [place] 2014/12/21, 4:33  PM  [facility] [address] [place], [place], [address] Phone: [phone]   Fax:  [phone]

## 2014-12-16 NOTE — Patient Instructions (Signed)
SWALLOWING EXERCISES Do these 6 of the 7 days per week until 6 months after your last day of radiation, then 3 times per week afterwards  1. Effortful Swallows - Squeeze hard with the muscles in your neck while you swallow your  saliva or a sip of water - Repeat 15-20 times, 2-3 times a day, and use whenever you eat or drink  2. Masako Swallow - swallow with your tongue sticking out - Stick tongue out past your teeth and gently bite tongue with your teeth - Swallow, while holding your tongue with your teeth - Repeat 20 times, 2-3 times a day *use a wet spoon if your mouth gets dry*  3. Pitch Raise - Repeat "he", once per second in as high of a pitch as you can - Repeat 20 times, 2-3 times a day  4. Shaker Exercise - head lift - Lie flat on your back in your bed or on a couch without pillows - Raise your head and look at your feet - KEEP YOUR SHOULDERS DOWN - HOLD FOR 45-60 SECONDS, then lower your head back down - Repeat 3 times, 2-3 times a day        4b. Do the same head/neck movement but in 1 second lifts, 30 times, twice a day  5. Mendelsohn Maneuver - "half swallow" exercise - Start to swallow, and keep your Adam's apple up by squeezing hard with the muscles of the throat - Hold the squeeze for 5-7 seconds and then relax - Repeat 20 times, 2-3 times a day *use a wet spoon if your mouth gets dry*  6. Tongue Press - Press your entire tongue as hard as you can against the roof of your mouth for 3-5 seconds - Repeat 20 times, 2-3 times a day  7. Breath Hold - Say "HUH!" loudly, then hold your breath for 3 seconds at your voice box - Repeat 20 times, 2-3 times a day  8. Chin pushback - Open your mouth  - Place your fist UNDER your chin near your neck, and push back with your fist for 5 seconds - Repeat 10 times, 2-3 times a day

## 2014-12-16 NOTE — Telephone Encounter (Signed)
s.w. pt and advised on 3.9 MD visit moved to 3.8 per MD request....pt ok and aware

## 2014-12-17 ENCOUNTER — Other Ambulatory Visit: Payer: Medicare Other

## 2014-12-17 ENCOUNTER — Telehealth: Payer: Self-pay | Admitting: Hematology and Oncology

## 2014-12-17 ENCOUNTER — Telehealth: Payer: Self-pay | Admitting: *Deleted

## 2014-12-17 ENCOUNTER — Ambulatory Visit (HOSPITAL_BASED_OUTPATIENT_CLINIC_OR_DEPARTMENT_OTHER): Payer: Medicare Other | Admitting: Hematology and Oncology

## 2014-12-17 ENCOUNTER — Encounter (HOSPITAL_COMMUNITY): Payer: Self-pay | Admitting: Dentistry

## 2014-12-17 ENCOUNTER — Encounter: Payer: Self-pay | Admitting: *Deleted

## 2014-12-17 ENCOUNTER — Ambulatory Visit (HOSPITAL_COMMUNITY): Payer: Self-pay | Admitting: Dentistry

## 2014-12-17 ENCOUNTER — Other Ambulatory Visit: Payer: Self-pay | Admitting: Hematology and Oncology

## 2014-12-17 ENCOUNTER — Other Ambulatory Visit (HOSPITAL_COMMUNITY)
Admission: RE | Admit: 2014-12-17 | Discharge: 2014-12-17 | Disposition: A | Discharge status: Home / Self Care | Payer: Medicare Other | Source: Ambulatory Visit | Attending: Radiation Oncology | Admitting: Radiation Oncology

## 2014-12-17 VITALS — BP 112/55 | HR 73 | Temp 98.0°F

## 2014-12-17 VITALS — BP 146/82 | HR 65 | Temp 98.2°F | Resp 18 | Ht 69.0 in | Wt 185.7 lb

## 2014-12-17 DIAGNOSIS — C01 Malignant neoplasm of base of tongue: Secondary | ICD-10-CM

## 2014-12-17 DIAGNOSIS — K0530 Chronic periodontitis, unspecified: Secondary | ICD-10-CM

## 2014-12-17 DIAGNOSIS — I10 Essential (primary) hypertension: Secondary | ICD-10-CM

## 2014-12-17 DIAGNOSIS — Z01818 Encounter for other preprocedural examination: Secondary | ICD-10-CM

## 2014-12-17 DIAGNOSIS — R221 Localized swelling, mass and lump, neck: Secondary | ICD-10-CM | POA: Insufficient documentation

## 2014-12-17 DIAGNOSIS — K036 Deposits [accretions] on teeth: Secondary | ICD-10-CM

## 2014-12-17 DIAGNOSIS — K08409 Partial loss of teeth, unspecified cause, unspecified class: Secondary | ICD-10-CM

## 2014-12-17 LAB — TSH CHCC: TSH: 0.676 m[IU]/L (ref 0.320–4.118)

## 2014-12-17 MED ORDER — LIDOCAINE-PRILOCAINE 2.5-2.5 % EX CREA
TOPICAL_CREAM | CUTANEOUS | Status: DC
Start: 2014-12-17 — End: 2015-02-17

## 2014-12-17 MED ORDER — PROCHLORPERAZINE MALEATE 10 MG PO TABS: 10.0000 mg | ORAL_TABLET | Freq: Four times a day (QID) | ORAL | Status: DC | PRN

## 2014-12-17 MED ORDER — ONDANSETRON HCL 8 MG PO TABS
8.0000 mg | ORAL_TABLET | Freq: Three times a day (TID) | ORAL | Status: DC | PRN
Start: 2014-12-17 — End: 2015-02-17

## 2014-12-17 MED ORDER — SODIUM FLUORIDE 1.1 % DT GEL: DENTAL | Status: DC

## 2014-12-17 NOTE — Telephone Encounter (Signed)
Per staff message from MD I have moved appts

## 2014-12-17 NOTE — Telephone Encounter (Signed)
s.w. pt and advised on lab for 3.9.Marland KitchenMarland KitchenMarland Kitchenpt ok and aware

## 2014-12-17 NOTE — Progress Notes (Signed)
DENTAL CONSULTATION  Date of Consultation:  12/17/2014 Patient Name:   CALISTRO RAUF Date of Birth:   1941-04-29 Medical Record Number: [id number]  VITALS: BP 112/55 mmHg  Pulse 73  Temp(Src) 98 F (36.7 C) (Oral)  CHIEF COMPLAINT: Patient referred by Dr. Isidore Moos for a medically necessary pre-chemoradiation dental consultation.   HPI: THAINE GARRIGA a 74 year old male recently diagnosed with squamous cell carcinoma of the right base of tongue. Patient with anticipated chemoradiation therapy. Patient is now seen as part of a medically necessary pre-chemoradiation therapy dental protocol examination.  The patient currently denies acute toothaches, swellings, or abscesses. Patient was last seen by his Dentist in December 2015 for an exam and cleaning. Patient saw Dr. Benard Rink. Patient sees him on every 6 month basis. Patient denies having any unmet dental needs at this time.  PROBLEM LIST: Patient Active Problem List   Diagnosis Date Noted  . Cancer of base of tongue 12/02/2014    Priority: High  . History of skin cancer 12/03/2014  . Chronic systolic CHF (congestive heart failure) 07/12/2013  . MUSCLE STRAIN, ABDOMINAL WALL 11/28/2008  . ARTHRITIS 10/22/2008  . BURSITIS, LEFT SHOULDER 07/24/2008  . INTERNAL HEMORRHOIDS 12/08/2007  . EXTERNAL HEMORRHOIDS 12/08/2007  . DIVERTICULOSIS, COLON 12/08/2007  . RECTAL BLEEDING 12/08/2007  . CONGESTIVE HEART FAILURE, HX OF 12/08/2007  . Congestive dilated cardiomyopathy 06/13/2007  . HX, PERSONAL, ARTHRITIS 06/13/2007  . Hypercholesterolemia 05/15/2007  . Essential hypertension 05/15/2007    PMH: Past Medical History  Diagnosis Date  . Hypertension   . Dilated cardiomyopathy   . Hypercholesterolemia   . DIVERTICULOSIS, COLON 12/08/2007  . ARTHRITIS 10/22/2008  . History of echocardiogram 05/22/2007    EF was 45-50% / Mild concentric LV hypertrophy with mild global hypokinesis and overall mild systolic dysfunction .  Mild AV  sclerosis / Mild Mitral insufficiency / compared to prior study 04/24/02 -- LV function has improved further.    . CHF (congestive heart failure)     sees Dr. Peter [name]   . BPH (benign prostatic hyperplasia)   . Squamous cell carcinoma 11/20/14    base of tongue primary  . Skin cancer     squamous cell, basal cell  . H/O asbestos exposure   . Glaucoma     PSH: Past Surgical History  Procedure Laterality Date  . Cardiac catheterization  10/17/2001    EF estimated at 30% / moderate LV enlargement  / 1. Minimal nonobstructive atherosclerotic coronary artery disease / 2. Severe LV dysfunction with global hypokinesia consistent with dilated nonischemic cardiomyopath / 3. Moderate pulmonary hypertension  . Colonoscopy  08-15-07    per Dr. Carlean Purl, clear , repeat in 10 yrs  . Lymph node biopsy      ALLERGIES: No Known Allergies  MEDICATIONS: Current Outpatient Prescriptions  Medication Sig Dispense Refill  . ASPIRIN PO Take 81 mg by mouth every morning.     . carvedilol (COREG) 25 MG tablet Take 1 tablet (25 mg total) by mouth 2 (two) times daily with a meal. 180 tablet 3  . diclofenac (VOLTAREN) 75 MG EC tablet Take 1 tablet (75 mg total) by mouth 2 (two) times daily. (Patient taking differently: Take 75 mg by mouth 2 (two) times daily as needed for mild pain. ) 180 tablet 3  . hydrocortisone (PROCTOZONE-HC) 2.5 % rectal cream Place rectally as needed. 90 g 3  . LORazepam (ATIVAN) 0.5 MG tablet Take 1-2 tablets PRN claustrophobia; take 20 minutes before wearing radiotherapy mask.  36 tablet 0  . multivitamin (THERAGRAN) per tablet Take 1 tablet by mouth every morning.     . quinapril (ACCUPRIL) 20 MG tablet Take 1 tablet (20 mg total) by mouth at bedtime. 90 tablet 3  . saw palmetto 160 MG capsule Take 1 capsule (160 mg total) by mouth daily. (Patient taking differently: Take 160 mg by mouth every morning. ) 30 capsule 0  . simvastatin (ZOCOR) 40 MG tablet Take 1 tablet (40 mg total) by  mouth at bedtime. 90 tablet 3  . tadalafil (CIALIS) 20 MG tablet Take 1 tablet (20 mg total) by mouth daily as needed for erectile dysfunction. 30 tablet 3  . TRAVATAN Z 0.004 % SOLN ophthalmic solution Place 1 drop into both eyes at bedtime.      No current facility-administered medications for this visit.    LABS: Lab Results  Component Value Date   WBC 7.7 09/09/2014   HGB 15.5 09/09/2014   HCT 48.1 09/09/2014   MCV 88.2 09/09/2014   PLT 230.0 09/09/2014      Component Value Date/Time   NA 140 09/09/2014 0838   K 3.9 09/09/2014 0838   CL 108 09/09/2014 0838   CO2 24 09/09/2014 0838   GLUCOSE 102* 09/09/2014 0838   BUN 19 09/09/2014 0838   CREATININE 1.1 09/09/2014 0838   CALCIUM 9.0 09/09/2014 0838   GFRNONAA 61.82 08/04/2010 0855   GFRAA  09/11/2009 1230    >60        The eGFR has been calculated using the MDRD equation. This calculation has not been validated in all clinical situations. eGFR's persistently <60 mL/min signify possible Chronic Kidney Disease.   No results found for: INR, PROTIME No results found for: PTT  SOCIAL HISTORY: History   Social History  . Marital Status: Married    Spouse Name: N/A  . Number of Children: 4  . Years of Education: N/A   Occupational History  . 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 Smoking status: Never Smoker   . Smokeless tobacco: Never Used  . Alcohol Use: 0.0 oz/week    0 Standard drinks or equivalent per week     Comment: occl  . Drug Use: No  . Sexual Activity: Not on file     Comment: retired Pension scheme manager, married   Other Topics Concern  . Not on file   Social History Narrative    FAMILY HISTORY: Family History  Problem Relation Age of Onset  . Stroke Father   . Cancer Father     kidney ca  . Angina Mother     REVIEW OF SYSTEMS: Reviewed with the patient included in dental record.  DENTAL HISTORY: CHIEF COMPLAINT: Patient referred by Dr. Isidore Moos for a medically  necessary pre-chemoradiation dental consultation.   HPI: FAYE SANFILIPPO a 74 year old male recently diagnosed with squamous cell carcinoma of the right base of tongue. Patient with anticipated chemoradiation therapy. Patient is now seen as part of a medically necessary pre-chemoradiation therapy dental protocol examination.  The patient currently denies acute toothaches, swellings, or abscesses. Patient was last seen by his Dentist in December 2015 for an exam and cleaning. Patient saw Dr. Benard Rink. Patient sees him on every 6 month basis. Patient denies having any unmet dental needs at this time.  DENTAL EXAMINATION: GENERAL: The patient is a well-developed, well-nourished male in no acute distress. HEAD AND NECK: The patient has right neck lymphadenopathy. I do not  palpate any left neck lymphadenopathy. The patient denies acute TMJ symptoms. Maximum interincisal opening is 35 mm. INTRAORAL EXAM: Patient has normal saliva. There is no evidence of oral abscess formation.  DENTITION: Patient is missing tooth numbers 1, 16, 20, and 32. PERIODONTAL: Patient has chronic periodontitis with plaque accumulations, selective areas of gingival recession and no significant tooth mobility. DENTAL CARIES/SUBOPTIMAL RESTORATIONS: There are no obvious dental caries noted. Distal margins of tooth #15 and 18 need to be followed for recurrent caries. ENDODONTIC: Patient currently denies acute pulpitis symptoms. Patient has a previous root canal associated with tooth #18 with no obvious persistent periapical pathology or symptoms. CROWN AND BRIDGE: There are multiple crown and bridge restorations. PROSTHODONTIC: No history of partial dentures. OCCLUSION: Patient has a poor occlusal scheme but a stable occlusion.  RADIOGRAPHIC INTERPRETATION: An orthopantogram was taken and supplemented with a full series of dental radiographs There are multiple missing teeth. There is supra-eruption and drifting of the  unopposed teeth into the edentulous areas. There is incipient to moderate bone loss. Multiple dental restorations are noted. Patient has previous root canal therapy associated with tooth #18. There are no obvious periapical radiolucencies.   ASSESSMENTS: 1. Squamous cell carcinoma of the right base of tongue. 2. Pre-chemoradiation therapy dental protocol examination  3. Chronic periodontitis with bone loss 4. Selective areas gingival recession 5. Accretions-minimal 6. Missing teeth 7. Poor occlusal scheme but a stable occlusion  PLAN/RECOMMENDATIONS: 1. I discussed the risks, benefits, and complications of various treatment options with the patient in relationship to his medical and dental conditions, and tooth dissipated chemoradiation therapy, and chemoradiation therapy side effects to include xerostomia, radiation caries, trismus, mucositis, taste changes, gum and jawbone changes, and risk for infection and osteoradionecrosis. We discussed various treatment options to include no treatment, extraction of teeth in the primary field radiation therapy with alveoloplasty, pre-prosthetic surgery as indicated, periodontal therapy, dental restorations, root canal therapy, crown and bridge therapy, implant therapy, and replacement of missing teeth as indicated. We discussed the anticipated ports and doses per Dr. Lanell Persons drawing. The patient currently wishes to NOT have any dental extractions at this time. The patient did agree to impressions for the fabrication of upper and lower fluoride trays and scatter guards. A trismus device was fabricated. A prescription for FluoroSHIELD was sent to [facility] outpatient pharmacy.   2. Discussion of findings with medical team and coordination of future medical and dental care as needed.  I spent in excess of  90 minutes during the conduct of this consultation and >50% of this time involved direct face-to-face encounter for counseling and/or coordination of the  patient's care.  Lenn Cal, DDS

## 2014-12-17 NOTE — Progress Notes (Signed)
To provide support and encouragement, care continuity and to assess for needs, met with patient during his appt with Dr. Alvy Bimler.  He was accompanied by his wife and dtr.  He verbalized understanding of diagnosis upgrade to Stage 4 based on size of lymph node.  He verbalized understanding of PET review with Dr. Alvy Bimler.  We discussed the value of PEG placement.  He verbalized understanding of Care Plan presented by Dr. Alvy Bimler.  He denied any needs at this time, understands he can contact me.  Gayleen Orem, RN, BSN, [place] [facility] [place] [phone]

## 2014-12-17 NOTE — Patient Instructions (Signed)

## 2014-12-17 NOTE — Telephone Encounter (Signed)
gave pt avs report and appts for march thru may. added lab for 3/15. no f/u given at on 3/8 pof at this time.

## 2014-12-18 ENCOUNTER — Ambulatory Visit: Payer: Medicare Other | Admitting: Hematology and Oncology

## 2014-12-18 ENCOUNTER — Encounter: Payer: Self-pay | Admitting: Hematology and Oncology

## 2014-12-18 ENCOUNTER — Ambulatory Visit (HOSPITAL_COMMUNITY): Payer: Self-pay | Admitting: Dentistry

## 2014-12-18 ENCOUNTER — Encounter (HOSPITAL_COMMUNITY): Payer: Self-pay | Admitting: Dentistry

## 2014-12-18 ENCOUNTER — Other Ambulatory Visit: Payer: Medicare Other

## 2014-12-18 ENCOUNTER — Other Ambulatory Visit (HOSPITAL_BASED_OUTPATIENT_CLINIC_OR_DEPARTMENT_OTHER): Payer: Medicare Other

## 2014-12-18 VITALS — BP 121/73 | HR 71 | Temp 98.4°F

## 2014-12-18 DIAGNOSIS — C01 Malignant neoplasm of base of tongue: Secondary | ICD-10-CM

## 2014-12-18 DIAGNOSIS — Z01818 Encounter for other preprocedural examination: Secondary | ICD-10-CM

## 2014-12-18 DIAGNOSIS — Z463 Encounter for fitting and adjustment of dental prosthetic device: Secondary | ICD-10-CM

## 2014-12-18 LAB — CBC WITH DIFFERENTIAL/PLATELET
BASO%: 0.7 % (ref 0.0–2.0)
Basophils Absolute: 0.1 10*3/uL (ref 0.0–0.1)
EOS%: 1 % (ref 0.0–7.0)
Eosinophils Absolute: 0.1 10*3/uL (ref 0.0–0.5)
HCT: 47.2 % (ref 38.4–49.9)
HGB: 15.1 g/dL (ref 13.0–17.1)
LYMPH%: 15.7 % (ref 14.0–49.0)
MCH: 28.7 pg (ref 27.2–33.4)
MCHC: 32 g/dL (ref 32.0–36.0)
MCV: 89.7 fL (ref 79.3–98.0)
MONO#: 0.4 10*3/uL (ref 0.1–0.9)
MONO%: 5.7 % (ref 0.0–14.0)
NEUT%: 76.9 % — ABNORMAL HIGH (ref 39.0–75.0)
NEUTROS ABS: 6 10*3/uL (ref 1.5–6.5)
Platelets: 199 10*3/uL (ref 140–400)
RBC: 5.26 10*6/uL (ref 4.20–5.82)
RDW: 14.7 % — AB (ref 11.0–14.6)
WBC: 7.8 10*3/uL (ref 4.0–10.3)
lymph#: 1.2 10*3/uL (ref 0.9–3.3)

## 2014-12-18 LAB — COMPREHENSIVE METABOLIC PANEL (CC13)
ALT: 23 U/L (ref 0–55)
ANION GAP: 9 meq/L (ref 3–11)
AST: 19 U/L (ref 5–34)
Albumin: 3.6 g/dL (ref 3.5–5.0)
Alkaline Phosphatase: 60 U/L (ref 40–150)
BUN: 15.7 mg/dL (ref 7.0–26.0)
CO2: 26 mEq/L (ref 22–29)
CREATININE: 1.1 mg/dL (ref 0.7–1.3)
Calcium: 9.2 mg/dL (ref 8.4–10.4)
Chloride: 109 mEq/L (ref 98–109)
EGFR: 71 mL/min/{1.73_m2} — AB (ref >90)
Glucose: 123 mg/dl (ref 70–140)
Potassium: 4.1 mEq/L (ref 3.5–5.1)
Sodium: 144 mEq/L (ref 136–145)
TOTAL PROTEIN: 6.4 g/dL (ref 6.4–8.3)
Total Bilirubin: 0.51 mg/dL (ref 0.20–1.20)

## 2014-12-18 LAB — MAGNESIUM (CC13): Magnesium: 2.5 mg/dl (ref 1.5–2.5)

## 2014-12-18 NOTE — Patient Instructions (Signed)

## 2014-12-18 NOTE — Assessment & Plan Note (Signed)
We discussed the role of chemotherapy. The intent is for cure.  We discussed some of the risks, benefits, side-effects of cisplatin. Some of the short term side-effects included, though not limited to, including weight loss, life threatening infections, risk of allergic reactions, need for transfusions of blood products, nausea, vomiting, change in bowel habits, loss of hair, admission to hospital for various reasons, and risks of death.   Long term side-effects are also discussed including risks of infertility, permanent damage to nerve function, hearing loss, chronic fatigue, kidney damage with possibility needing hemodialysis, and rare secondary malignancy including bone marrow disorders.  The patient is aware that the response rates discussed earlier is not guaranteed.  After a long discussion, patient made an informed decision to proceed with the prescribed plan of care and went ahead to sign the consent form today.   Patient education material was dispensed. We will proceed with treatment, hopefully on 01/01/2015. The patient will have placement of feeding tube and port next week. I will see him on a weekly basis after his treatment is started.

## 2014-12-18 NOTE — Progress Notes (Signed)
[place] OFFICE PROGRESS NOTE  Patient Care Team: Laurey Morale, MD as PCP - General (Family Medicine) Heath Lark, MD as Consulting Physician (Hematology and Oncology)  SUMMARY OF ONCOLOGIC HISTORY: Oncology History   Cancer of base of tongue   Staging form: Lip and Oral Cavity, AJCC 7th Edition     Clinical stage from 12/03/2014: Stage IVA (T2, N2a, M0) - Signed by Heath Lark, MD on 12/13/2014 HPV negative           Cancer of base of tongue   11/20/2014 Pathology Results Accession: [id number] confirm squamous cell carcinoma.   11/20/2014 Procedure The patient underwent fine-needle aspirate of the right lymph node   12/02/2014 Imaging CT scan showed 2.2 cm the right tongue base mass with necrotic 4 cm right level II nodal metastasis with multiple bilateral small lymphadenopathy   12/10/2014 Imaging PET CT scan showed tongue base mass with right level II lymph node metastasis     INTERVAL HISTORY: Please see below for problem oriented charting. He is seen today to discuss plan of care. He remained asymptomatic  REVIEW OF SYSTEMS:   Constitutional: Denies fevers, chills or abnormal weight loss Eyes: Denies blurriness of vision Ears, nose, mouth, throat, and face: Denies mucositis or sore throat Respiratory: Denies cough, dyspnea or wheezes Cardiovascular: Denies palpitation, chest discomfort or lower extremity swelling Gastrointestinal:  Denies nausea, heartburn or change in bowel habits Skin: Denies abnormal skin rashes Lymphatics: Denies new lymphadenopathy or easy bruising Neurological:Denies numbness, tingling or new weaknesses Behavioral/Psych: Mood is stable, no new changes  All other systems were reviewed with the patient and are negative.  I have reviewed the past medical history, past surgical history, social history and family history with the patient and they are unchanged from previous note.  ALLERGIES:  has No Known Allergies.  MEDICATIONS:   Current Outpatient Prescriptions  Medication Sig Dispense Refill  . ASPIRIN PO Take 81 mg by mouth every morning.     . carvedilol (COREG) 25 MG tablet Take 1 tablet (25 mg total) by mouth 2 (two) times daily with a meal. 180 tablet 3  . diclofenac (VOLTAREN) 75 MG EC tablet Take 1 tablet (75 mg total) by mouth 2 (two) times daily. (Patient taking differently: Take 75 mg by mouth 2 (two) times daily as needed for mild pain. ) 180 tablet 3  . hydrocortisone (PROCTOZONE-HC) 2.5 % rectal cream Place rectally as needed. 90 g 3  . LORazepam (ATIVAN) 0.5 MG tablet Take 1-2 tablets PRN claustrophobia; take 20 minutes before wearing radiotherapy mask. 36 tablet 0  . multivitamin (THERAGRAN) per tablet Take 1 tablet by mouth every morning.     . quinapril (ACCUPRIL) 20 MG tablet Take 1 tablet (20 mg total) by mouth at bedtime. 90 tablet 3  . saw palmetto 160 MG capsule Take 1 capsule (160 mg total) by mouth daily. (Patient taking differently: Take 160 mg by mouth every morning. ) 30 capsule 0  . simvastatin (ZOCOR) 40 MG tablet Take 1 tablet (40 mg total) by mouth at bedtime. 90 tablet 3  . sodium fluoride (FLUORISHIELD) 1.1 % GEL dental gel Instill one drop of gel per tooth space of fluoride tray. Place over teeth for 5 minutes. Remove. Spit out excess. Repeat nightly. 120 mL prn  . tadalafil (CIALIS) 20 MG tablet Take 1 tablet (20 mg total) by mouth daily as needed for erectile dysfunction. 30 tablet 3  . TRAVATAN Z 0.004 % SOLN ophthalmic solution Place 1  drop into both eyes at bedtime.     . lidocaine-prilocaine (EMLA) cream Apply to affected area once 30 g 3  . ondansetron (ZOFRAN) 8 MG tablet Take 1 tablet (8 mg total) by mouth every 8 (eight) hours as needed. 30 tablet 1  . prochlorperazine (COMPAZINE) 10 MG tablet Take 1 tablet (10 mg total) by mouth every 6 (six) hours as needed (Nausea or vomiting). 30 tablet 1   No current facility-administered medications for this visit.    PHYSICAL  EXAMINATION: ECOG PERFORMANCE STATUS: 0 - Asymptomatic  Filed Vitals:   12/17/14 1319  BP: 146/82  Pulse: 65  Temp: 98.2 F (36.8 C)  Resp: 18   Filed Weights   12/17/14 1319  Weight: 185 lb 11.2 oz (84.233 kg)    GENERAL:alert, no distress and comfortable SKIN: skin color, texture, turgor are normal, no rashes or significant lesions EYES: normal, Conjunctiva are pink and non-injected, sclera clear OROPHARYNX:no exudate, no erythema and lips, buccal mucosa, and tongue normal  NECK: Persistent mass on the right side.  Musculoskeletal:no cyanosis of digits and no clubbing  NEURO: alert & oriented x 3 with fluent speech, no focal motor/sensory deficits  LABORATORY DATA:  I have reviewed the data as listed    Component Value Date/Time   NA 140 09/09/2014 0838   K 3.9 09/09/2014 0838   CL 108 09/09/2014 0838   CO2 24 09/09/2014 0838   GLUCOSE 102* 09/09/2014 0838   BUN 19 09/09/2014 0838   CREATININE 1.1 09/09/2014 0838   CALCIUM 9.0 09/09/2014 0838   PROT 6.4 09/09/2014 0838   ALBUMIN 3.9 09/09/2014 0838   AST 30 09/09/2014 0838   ALT 41 09/09/2014 0838   ALKPHOS 75 09/09/2014 0838   BILITOT 0.5 09/09/2014 0838   GFRNONAA 61.82 08/04/2010 0855   GFRAA  09/11/2009 1230    >60        The eGFR has been calculated using the MDRD equation. This calculation has not been validated in all clinical situations. eGFR's persistently <60 mL/min signify possible Chronic Kidney Disease.    No results found for: SPEP, UPEP  Lab Results  Component Value Date   WBC 7.7 09/09/2014   NEUTROABS 4.8 09/09/2014   HGB 15.5 09/09/2014   HCT 48.1 09/09/2014   MCV 88.2 09/09/2014   PLT 230.0 09/09/2014      Chemistry      Component Value Date/Time   NA 140 09/09/2014 0838   K 3.9 09/09/2014 0838   CL 108 09/09/2014 0838   CO2 24 09/09/2014 0838   BUN 19 09/09/2014 0838   CREATININE 1.1 09/09/2014 0838      Component Value Date/Time   CALCIUM 9.0 09/09/2014 0838    ALKPHOS 75 09/09/2014 0838   AST 30 09/09/2014 0838   ALT 41 09/09/2014 0838   BILITOT 0.5 09/09/2014 0838       RADIOGRAPHIC STUDIES: I reviewed the most recent PET CT scan with the patient and family I have personally reviewed the radiological images as listed and agreed with the findings in the report.  ASSESSMENT & PLAN:  Cancer of base of tongue We discussed the role of chemotherapy. The intent is for cure.  We discussed some of the risks, benefits, side-effects of cisplatin. Some of the short term side-effects included, though not limited to, including weight loss, life threatening infections, risk of allergic reactions, need for transfusions of blood products, nausea, vomiting, change in bowel habits, loss of hair, admission to hospital for various  reasons, and risks of death.   Long term side-effects are also discussed including risks of infertility, permanent damage to nerve function, hearing loss, chronic fatigue, kidney damage with possibility needing hemodialysis, and rare secondary malignancy including bone marrow disorders.  The patient is aware that the response rates discussed earlier is not guaranteed.  After a long discussion, patient made an informed decision to proceed with the prescribed plan of care and went ahead to sign the consent form today.   Patient education material was dispensed. We will proceed with treatment, hopefully on 01/01/2015. The patient will have placement of feeding tube and port next week. I will see him on a weekly basis after his treatment is started.     Essential hypertension His blood pressure is mildly elevated. Once we start chemotherapy, I recommend he hold off taking Accupril as this could cause risk of nephrotoxicity.    No orders of the defined types were placed in this encounter.   All questions were answered. The patient knows to call the clinic with any problems, questions or concerns. No barriers to learning was  detected. I spent 30 minutes counseling the patient face to face. The total time spent in the appointment was 40 minutes and more than 50% was on counseling and review of test results     [facility], [place], MD 12/18/2014 7:27 AM

## 2014-12-18 NOTE — Progress Notes (Signed)
12/18/2014  Patient Name:   Nicholas Wilkerson Date of Birth:   11/10/1940 Medical Record Number: [id number]  BP 121/73 mmHg  Pulse 71  Temp(Src) 98.4 F (36.9 C) (Oral)  Nicholas Wilkerson now presents for insertion of upper and lower fluoride trays and scatter protection devices.  PROCEDURE: Appliances were tried in and adjusted as needed. [place] Trismus device was previously fabricated at 35 mm using 20 sticks. Postop instructions were provided and a written and verbal format concerning the use and care of appliances. All questions were answered. Patient to return to clinic for periodic oral examination in approximately 2-3 weeks during radiation therapy. Patient to call if questions or problems arise before then.  Lenn Cal, DDS

## 2014-12-18 NOTE — Assessment & Plan Note (Signed)
His blood pressure is mildly elevated. Once we start chemotherapy, I recommend he hold off taking Accupril as this could cause risk of nephrotoxicity.

## 2014-12-19 ENCOUNTER — Encounter: Payer: Self-pay | Admitting: Radiation Oncology

## 2014-12-19 ENCOUNTER — Encounter: Payer: Self-pay | Admitting: *Deleted

## 2014-12-19 ENCOUNTER — Telehealth: Payer: Self-pay | Admitting: *Deleted

## 2014-12-19 NOTE — Telephone Encounter (Signed)
Returned wife's VM inquiry re: status of FMLA papers.  I indicated there were complete, that I would leave them at the [facility] front desk for her pick-up.  She stated she would stop by tomorrow to collect.  Gayleen Orem, RN, BSN, [place] at [place] [phone]

## 2014-12-19 NOTE — Progress Notes (Unsigned)
FMLA paperwork completed and copy will be scanned. Originals returned to Gayleen Orem, Therapist, sports.

## 2014-12-20 ENCOUNTER — Other Ambulatory Visit: Payer: Self-pay | Admitting: *Deleted

## 2014-12-20 DIAGNOSIS — C01 Malignant neoplasm of base of tongue: Secondary | ICD-10-CM

## 2014-12-20 NOTE — Progress Notes (Signed)
Referral made to [facility] for services s/p next Tuesday's PEG placement.  Verbal notification made to [place] with [facility] per her standing request.  Gayleen Orem, RN, BSN, [place] at [place] [phone]

## 2014-12-23 ENCOUNTER — Encounter: Payer: Self-pay | Admitting: *Deleted

## 2014-12-23 ENCOUNTER — Ambulatory Visit
Admission: RE | Admit: 2014-12-23 | Discharge: 2014-12-23 | Disposition: A | Discharge status: Home / Self Care | Payer: Medicare Other | Source: Ambulatory Visit | Attending: Radiation Oncology | Admitting: Radiation Oncology

## 2014-12-23 ENCOUNTER — Encounter (HOSPITAL_COMMUNITY): Payer: Self-pay

## 2014-12-23 ENCOUNTER — Other Ambulatory Visit: Payer: Self-pay | Admitting: Radiology

## 2014-12-23 VITALS — BP 142/78 | HR 75 | Temp 98.1°F | Resp 12 | Wt 185.6 lb

## 2014-12-23 DIAGNOSIS — Z510 Encounter for antineoplastic radiation therapy: Secondary | ICD-10-CM | POA: Diagnosis present

## 2014-12-23 DIAGNOSIS — L599 Disorder of the skin and subcutaneous tissue related to radiation, unspecified: Secondary | ICD-10-CM | POA: Diagnosis not present

## 2014-12-23 DIAGNOSIS — K1233 Oral mucositis (ulcerative) due to radiation: Secondary | ICD-10-CM | POA: Diagnosis not present

## 2014-12-23 DIAGNOSIS — C01 Malignant neoplasm of base of tongue: Secondary | ICD-10-CM

## 2014-12-23 DIAGNOSIS — Z931 Gastrostomy status: Secondary | ICD-10-CM | POA: Diagnosis not present

## 2014-12-23 MED ORDER — SODIUM CHLORIDE 0.9 % IJ SOLN
10.0000 mL | INTRAMUSCULAR | Status: DC | PRN
  Administered 2014-12-23: 10 mL via INTRAVENOUS

## 2014-12-23 NOTE — Progress Notes (Signed)
Simulation, IMRT treatment planning, and Special treatment procedure note   Outpatient  Diagnosis:    ICD-9-CM ICD-10-CM   1. Cancer of base of tongue 141.0 C01      The patient was taken to the CT simulator and laid in the supine position on the table. An Aquaplast head and shoulder mask was custom fitted to the patient's anatomy. High-resolution CT axial imaging was obtained of the head and neck with contrast. I verified that the quality of the imaging is good for treatment planning. 1 Medically Necessary Treatment Device was fabricated and supervised by me: Aquaplast mask.   Treatment planning note I plan to treat the patient with helical Tomotherapy, IMRT. I plan to treat the patient's tumor and bilateral neck nodes. I plan to treat to a total dose of 70 Gray in 35 fractions. Dose calculation was ordered from dosimetry.  IMRT planning Note  IMRT is an important modality to deliver adequate dose to the patient's at risk tissues while sparing the patient's normal structures, including the: esophagus, parotid tissue, mandible, brain stem, spinal cord, oral cavity, brachial plexus.  This justifies the use of IMRT in the patient's treatment.   Special Treatment Procedure Note:  The patient will be receiving chemotherapy concurrently. Chemotherapy heightens the risk of side effects. I have considered this during the patient's treatment planning process and will monitor the patient accordingly for side effects on a weekly basis. Concurrent chemotherapy increases the complexity of this patient's treatment and therefore this constitutes a special treatment procedure.  -----------------------------------  Eppie Gibson, MD

## 2014-12-23 NOTE — Progress Notes (Signed)
To provide support and encouragement, care continuity, met with patient during SIM.  Toured [place] afterwards, reviewed arrival and check-in procedure.  Provided him an Epic calendar with appointments.  He verbalized understanding of 0730 arrival tomorrow morning at [facility] for [facility] and PEG placement.  Gayleen Orem, RN, BSN, [place] at [place] [phone]

## 2014-12-24 ENCOUNTER — Other Ambulatory Visit: Payer: Self-pay

## 2014-12-24 ENCOUNTER — Other Ambulatory Visit: Payer: Self-pay | Admitting: Hematology and Oncology

## 2014-12-24 ENCOUNTER — Ambulatory Visit (HOSPITAL_COMMUNITY)
Admission: RE | Admit: 2014-12-24 | Discharge: 2014-12-24 | Disposition: A | Discharge status: Home / Self Care | Payer: Medicare Other | Source: Ambulatory Visit | Attending: Hematology and Oncology | Admitting: Hematology and Oncology

## 2014-12-24 ENCOUNTER — Ambulatory Visit (HOSPITAL_COMMUNITY)
Admission: RE | Admit: 2014-12-24 | Discharge: 2014-12-24 | Disposition: A | Discharge status: Home / Self Care | Payer: Medicare Other | Source: Ambulatory Visit | Attending: Interventional Radiology | Admitting: Interventional Radiology

## 2014-12-24 ENCOUNTER — Encounter (HOSPITAL_COMMUNITY): Payer: Self-pay

## 2014-12-24 DIAGNOSIS — Z85828 Personal history of other malignant neoplasm of skin: Secondary | ICD-10-CM | POA: Insufficient documentation

## 2014-12-24 DIAGNOSIS — Z7982 Long term (current) use of aspirin: Secondary | ICD-10-CM | POA: Insufficient documentation

## 2014-12-24 DIAGNOSIS — K5790 Diverticulosis of intestine, part unspecified, without perforation or abscess without bleeding: Secondary | ICD-10-CM | POA: Diagnosis not present

## 2014-12-24 DIAGNOSIS — C01 Malignant neoplasm of base of tongue: Secondary | ICD-10-CM | POA: Diagnosis not present

## 2014-12-24 DIAGNOSIS — I2510 Atherosclerotic heart disease of native coronary artery without angina pectoris: Secondary | ICD-10-CM | POA: Insufficient documentation

## 2014-12-24 DIAGNOSIS — N400 Enlarged prostate without lower urinary tract symptoms: Secondary | ICD-10-CM | POA: Diagnosis not present

## 2014-12-24 DIAGNOSIS — I429 Cardiomyopathy, unspecified: Secondary | ICD-10-CM | POA: Insufficient documentation

## 2014-12-24 DIAGNOSIS — I10 Essential (primary) hypertension: Secondary | ICD-10-CM | POA: Insufficient documentation

## 2014-12-24 DIAGNOSIS — I509 Heart failure, unspecified: Secondary | ICD-10-CM | POA: Insufficient documentation

## 2014-12-24 DIAGNOSIS — I080 Rheumatic disorders of both mitral and aortic valves: Secondary | ICD-10-CM | POA: Diagnosis not present

## 2014-12-24 DIAGNOSIS — H409 Unspecified glaucoma: Secondary | ICD-10-CM | POA: Insufficient documentation

## 2014-12-24 DIAGNOSIS — M1990 Unspecified osteoarthritis, unspecified site: Secondary | ICD-10-CM | POA: Insufficient documentation

## 2014-12-24 DIAGNOSIS — E780 Pure hypercholesterolemia: Secondary | ICD-10-CM | POA: Insufficient documentation

## 2014-12-24 LAB — CBC WITH DIFFERENTIAL/PLATELET
BASOS ABS: 0.1 10*3/uL (ref 0.0–0.1)
Basophils Relative: 1 % (ref 0–1)
Eosinophils Absolute: 0.1 10*3/uL (ref 0.0–0.7)
Eosinophils Relative: 2 % (ref 0–5)
HCT: 46.5 % (ref 39.0–52.0)
Hemoglobin: 15.3 g/dL (ref 13.0–17.0)
LYMPHS PCT: 25 % (ref 12–46)
Lymphs Abs: 1.7 10*3/uL (ref 0.7–4.0)
MCH: 29.5 pg (ref 26.0–34.0)
MCHC: 32.9 g/dL (ref 30.0–36.0)
MCV: 89.6 fL (ref 78.0–100.0)
Monocytes Absolute: 0.7 10*3/uL (ref 0.1–1.0)
Monocytes Relative: 10 % (ref 3–12)
NEUTROS ABS: 4.4 10*3/uL (ref 1.7–7.7)
Neutrophils Relative %: 62 % (ref 43–77)
PLATELETS: 189 10*3/uL (ref 150–400)
RBC: 5.19 MIL/uL (ref 4.22–5.81)
RDW: 13.8 % (ref 11.5–15.5)
WBC: 7 10*3/uL (ref 4.0–10.5)

## 2014-12-24 LAB — PROTIME-INR
INR: 1.01 (ref 0.00–1.49)
Prothrombin Time: 13.4 seconds (ref 11.6–15.2)

## 2014-12-24 LAB — COMPREHENSIVE METABOLIC PANEL
ALT: 25 U/L (ref 0–53)
AST: 21 U/L (ref 0–37)
Albumin: 4 g/dL (ref 3.5–5.2)
Alkaline Phosphatase: 63 U/L (ref 39–117)
Anion gap: 5 (ref 5–15)
BUN: 15 mg/dL (ref 6–23)
CALCIUM: 8.9 mg/dL (ref 8.4–10.5)
CO2: 27 mmol/L (ref 19–32)
Chloride: 109 mmol/L (ref 96–112)
Creatinine, Ser: 0.99 mg/dL (ref 0.50–1.35)
GFR calc Af Amer: >90 mL/min (ref >90)
GFR, EST NON AFRICAN AMERICAN: 80 mL/min — AB (ref >90)
Glucose, Bld: 118 mg/dL — ABNORMAL HIGH (ref 70–99)
Potassium: 3.8 mmol/L (ref 3.5–5.1)
Sodium: 141 mmol/L (ref 135–145)
Total Bilirubin: 0.7 mg/dL (ref 0.3–1.2)
Total Protein: 6.9 g/dL (ref 6.0–8.3)

## 2014-12-24 LAB — MAGNESIUM: MAGNESIUM: 2.2 mg/dL (ref 1.5–2.5)

## 2014-12-24 LAB — APTT: aPTT: 29 seconds (ref 24–37)

## 2014-12-24 MED ORDER — CEFAZOLIN SODIUM-DEXTROSE 2-3 GM-% IV SOLR
INTRAVENOUS | Status: AC
  Filled 2014-12-24: qty 50

## 2014-12-24 MED ORDER — LIDOCAINE HCL 1 % IJ SOLN
INTRAMUSCULAR | Status: AC
  Filled 2014-12-24: qty 40

## 2014-12-24 MED ORDER — MIDAZOLAM HCL 2 MG/2ML IJ SOLN
INTRAMUSCULAR | Status: AC
  Filled 2014-12-24: qty 2

## 2014-12-24 MED ORDER — IOHEXOL 300 MG/ML  SOLN: 10.0000 mL | Freq: Once | INTRAMUSCULAR | Status: AC | PRN

## 2014-12-24 MED ORDER — FENTANYL CITRATE 0.05 MG/ML IJ SOLN
INTRAMUSCULAR | Status: AC
  Filled 2014-12-24: qty 4

## 2014-12-24 MED ORDER — MIDAZOLAM HCL 2 MG/2ML IJ SOLN
INTRAMUSCULAR | Status: AC | PRN
  Administered 2014-12-24 (×6): 1 mg via INTRAVENOUS

## 2014-12-24 MED ORDER — LIDOCAINE-EPINEPHRINE 2 %-1:100000 IJ SOLN
INTRAMUSCULAR | Status: AC
  Filled 2014-12-24: qty 1

## 2014-12-24 MED ORDER — FENTANYL CITRATE 0.05 MG/ML IJ SOLN
INTRAMUSCULAR | Status: AC | PRN
  Administered 2014-12-24: 25 ug via INTRAVENOUS
  Administered 2014-12-24: 50 ug via INTRAVENOUS
  Administered 2014-12-24: 25 ug via INTRAVENOUS
  Administered 2014-12-24: 50 ug via INTRAVENOUS
  Administered 2014-12-24 (×2): 25 ug via INTRAVENOUS

## 2014-12-24 MED ORDER — CEFAZOLIN SODIUM-DEXTROSE 2-3 GM-% IV SOLR
2.0000 g | INTRAVENOUS | Status: AC
  Administered 2014-12-24: 2 g via INTRAVENOUS

## 2014-12-24 MED ORDER — HEPARIN SOD (PORK) LOCK FLUSH 100 UNIT/ML IV SOLN
INTRAVENOUS | Status: AC
  Filled 2014-12-24: qty 5

## 2014-12-24 MED ORDER — HYDROCODONE-ACETAMINOPHEN 5-325 MG PO TABS: 1.0000 | ORAL_TABLET | ORAL | Status: DC | PRN

## 2014-12-24 MED ORDER — SODIUM CHLORIDE 0.9 % IV SOLN
INTRAVENOUS | Status: DC
Start: 2014-12-24 — End: 2014-12-25
  Administered 2014-12-24: 08:00:00 via INTRAVENOUS

## 2014-12-24 MED ORDER — MIDAZOLAM HCL 2 MG/2ML IJ SOLN
INTRAMUSCULAR | Status: AC
  Filled 2014-12-24: qty 6

## 2014-12-24 MED ORDER — HEPARIN SOD (PORK) LOCK FLUSH 100 UNIT/ML IV SOLN
INTRAVENOUS | Status: AC | PRN
  Administered 2014-12-24: 500 [IU]

## 2014-12-24 NOTE — H&P (Signed)
Chief Complaint: "I'm getting a port and stomach tube put in"  Referring Physician(s): Gorsuch,Ni  History of Present Illness: Nicholas Wilkerson is a 74 y.o. male with history of recently diagnosed base of tongue cancer who presents today for port a cath and percutaneous gastrostomy tube placements prior to chemoradiation.   Past Medical History  Diagnosis Date  . Hypertension   . Dilated cardiomyopathy   . Hypercholesterolemia   . DIVERTICULOSIS, COLON 12/08/2007  . ARTHRITIS 10/22/2008  . History of echocardiogram 05/22/2007    EF was 45-50% / Mild concentric LV hypertrophy with mild global hypokinesis and overall mild systolic dysfunction .  Mild AV sclerosis / Mild Mitral insufficiency / compared to prior study 04/24/02 -- LV function has improved further.    . CHF (congestive heart failure)     sees Dr. Peter [name]   . BPH (benign prostatic hyperplasia)   . Squamous cell carcinoma 11/20/14    base of tongue primary  . Skin cancer     squamous cell, basal cell  . H/O asbestos exposure   . Glaucoma     Past Surgical History  Procedure Laterality Date  . Cardiac catheterization  10/17/2001    EF estimated at 30% / moderate LV enlargement  / 1. Minimal nonobstructive atherosclerotic coronary artery disease / 2. Severe LV dysfunction with global hypokinesia consistent with dilated nonischemic cardiomyopath / 3. Moderate pulmonary hypertension  . Colonoscopy  08-15-07    per Dr. Carlean Purl, clear , repeat in 10 yrs  . Lymph node biopsy      Allergies: Review of patient's allergies indicates no known allergies.  Medications: Prior to Admission medications   Medication Sig Start Date End Date Taking? Authorizing Provider  ASPIRIN PO Take 81 mg by mouth every morning.     Historical Provider, MD  carvedilol (COREG) 25 MG tablet Take 1 tablet (25 mg total) by mouth 2 (two) times daily with a meal. 08/06/14   Laurey Morale, MD  diclofenac (VOLTAREN) 75 MG EC tablet Take 1 tablet  (75 mg total) by mouth 2 (two) times daily. Patient taking differently: Take 75 mg by mouth 2 (two) times daily as needed for mild pain.  08/06/14   Laurey Morale, MD  hydrocortisone (PROCTOZONE-HC) 2.5 % rectal cream Place rectally as needed. 09/16/14   Laurey Morale, MD  lidocaine-prilocaine (EMLA) cream Apply to affected area once 12/17/14   Heath Lark, MD  LORazepam (ATIVAN) 0.5 MG tablet Take 1-2 tablets PRN claustrophobia; take 20 minutes before wearing radiotherapy mask. 12/11/14   Eppie Gibson, MD  multivitamin [facility]) per tablet Take 1 tablet by mouth every morning.     Historical Provider, MD  ondansetron (ZOFRAN) 8 MG tablet Take 1 tablet (8 mg total) by mouth every 8 (eight) hours as needed. 12/17/14   Heath Lark, MD  prochlorperazine (COMPAZINE) 10 MG tablet Take 1 tablet (10 mg total) by mouth every 6 (six) hours as needed (Nausea or vomiting). 12/17/14   Heath Lark, MD  quinapril (ACCUPRIL) 20 MG tablet Take 1 tablet (20 mg total) by mouth at bedtime. 08/06/14   Laurey Morale, MD  saw palmetto 160 MG capsule Take 1 capsule (160 mg total) by mouth daily. Patient taking differently: Take 160 mg by mouth every morning.  03/18/14   Laurey Morale, MD  simvastatin (ZOCOR) 40 MG tablet Take 1 tablet (40 mg total) by mouth at bedtime. 08/06/14   Laurey Morale, MD  sodium fluoride (  FLUORISHIELD) 1.1 % GEL dental gel Instill one drop of gel per tooth space of fluoride tray. Place over teeth for 5 minutes. Remove. Spit out excess. Repeat nightly. 12/17/14   Lenn Cal, DDS  tadalafil (CIALIS) 20 MG tablet Take 1 tablet (20 mg total) by mouth daily as needed for erectile dysfunction. 09/13/13   Laurey Morale, MD  TRAVATAN Z 0.004 % SOLN ophthalmic solution Place 1 drop into both eyes at bedtime.  11/29/13   Historical Provider, MD    Family History  Problem Relation Age of Onset  . Stroke Father   . Cancer Father     kidney ca  . Angina Mother     History   Social History  . Marital  Status: Married    Spouse Name: N/A  . Number of Children: 4  . Years of Education: N/A   Occupational History  . 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 Smoking status: Never Smoker   . Smokeless tobacco: Never Used  . Alcohol Use: 0.0 oz/week    0 Standard drinks or equivalent per week     Comment: occl  . Drug Use: No  . Sexual Activity: Not on file     Comment: retired Pension scheme manager, married   Other Topics Concern  . Not on file   Social History Narrative      Review of Systems  Constitutional: Negative for fever and chills.  Respiratory: Negative for cough and shortness of breath.   Cardiovascular: Negative for chest pain.  Gastrointestinal: Negative for nausea, vomiting, abdominal pain and blood in stool.  Genitourinary: Negative for dysuria and hematuria.  Musculoskeletal: Negative for back pain.  Neurological: Negative for headaches.  Hematological: Does not bruise/bleed easily.    Vital Signs: BP 150/97  HR 70  R 16  O2 SATS 96% RA  TEMP 98 Physical Exam  Imaging: Ct Soft Tissue Neck W Contrast  12/02/2014   CLINICAL DATA:  Lymphadenopathy. Lump on right side of neck since 09/2014. History of basal and squamous cell carcinomas of the skin of the face and head status post resection.  EXAM: CT NECK WITH CONTRAST  TECHNIQUE: Multidetector CT imaging of the neck was performed using the standard protocol following the bolus administration of intravenous contrast.  CONTRAST:  46mL OMNIPAQUE IOHEXOL 300 MG/ML  SOLN  COMPARISON:  Cervical spine CT 09/11/2009  FINDINGS: Pharynx and larynx: The nasopharynx is unremarkable. There is an enhancing mass involving the right tongue base protruding into the vallecula which measures approximately 10 x 7 x 22 mm (transverse by AP by craniocaudal). This appears to extend laterally to the right lateral oropharyngeal wall and does not appear to involve the epiglottis. The larynx is unremarkable.  Salivary glands:  The submandibular and parotid glands are unremarkable.  Thyroid: Unremarkable.  Lymph nodes: An nenlarged, partially necrotic right level IIA lymph node measures 2.6 x 2.4 x 4.0 cm. Immediately inferior to this in level III is a small but asymmetric lymph node measuring 5 mm in short axis, possibly with some subtle necrosis (series 3, image 46). A small but asymmetric right level III lymph node more posteriorly measures 4 mm (series 3, image 45). A slightly asymmetric right level IIB lymph node measures 6 mm (series 3, image 34). A small but asymmetric left level III lymph node measures 6 mm (series 3, image 50). Multiple subcentimeter lymph nodes are partially visualized in the mediastinum.  Vascular: Major vascular  structures of the appear patent. Mild atherosclerosis is noted at the carotid bifurcations.  Limited intracranial: The visualized portion of the brain is unremarkable.  Visualized orbits: Unremarkable.  Mastoids and visualized paranasal sinuses: Clear.  Skeleton: Mild cervical spondylosis. No suspicious lytic or blastic osseous lesions identified.  Upper chest: Visualized lung apices are clear.  IMPRESSION: 2.2 cm right tongue base mass concerning for malignancy. Necrotic right level II nodal metastasis. Subcentimeter right level II and bilateral level III lymph nodes are indeterminate.   Electronically Signed   By: Logan Bores   On: 12/02/2014 16:47   Nm Pet Image Initial (pi) Skull Base To Thigh  12/10/2014   CLINICAL DATA:  Initial treatment strategy for oropharyngeal squamous cell carcinoma.  EXAM: NUCLEAR MEDICINE PET SKULL BASE TO THIGH  TECHNIQUE: 9.9 mCi F-18 FDG was injected intravenously. Full-ring PET imaging was performed from the skull base to thigh after the radiotracer. CT data was obtained and used for attenuation correction and anatomic localization.  FASTING BLOOD GLUCOSE:  Value: 98 mg/dl  COMPARISON:  Neck CT 12/02/2014. CT of the abdomen and pelvis 09/11/2009.  FINDINGS: NECK   Asymmetric soft tissue thickening in the right side of the tongue base (better demonstrated on recent CT of the neck 12/02/2014) demonstrates extensive hypermetabolism (SUVmax = 10.5). There is also an enlarged hypermetabolic (SUVmax = 28.0) right-sided level 2 lymph node measuring 22 x 24 mm. No other hypermetabolic lymphadenopathy in the neck.  CHEST  Small foci of hypermetabolism in the right hilar region (SUVmax = 4.6)and in the subcarinal region (SUVmax = 6.1), without corresponding enlarged lymph nodes on the CT portion of the examination. Several calcified mediastinal lymph nodes are noted. No suspicious pulmonary nodules on the CT scan. No suspicious appearing pulmonary nodules or masses. No acute consolidative airspace disease no pleural effusions. There is atherosclerosis of the thoracic aorta, the great vessels of the mediastinum and the coronary arteries, including calcified atherosclerotic plaque in the left main, left anterior descending, left circumflex and right coronary arteries. Heart size is mildly enlarged. There is no significant pericardial fluid, thickening or pericardial calcification.  ABDOMEN/PELVIS  No abnormal hypermetabolic activity within the liver, pancreas, adrenal glands, or spleen. No hypermetabolic lymph nodes in the abdomen or pelvis. Several small low-attenuation liver lesions are noted, measuring up to 1.3 cm, incompletely characterized on today's noncontrast CT examination (but demonstrating no hypermetabolism). Normal appendix. Small focus of hypermetabolism in the peripheral zone of the prostate gland (SUVmax = 5.1). Benign appearing focus of calcification in the upper right retroperitoneum immediately posterior to the right lobe of the liver, presumably an area of fat necrosis.  SKELETON  No focal hypermetabolic activity to suggest skeletal metastasis.  IMPRESSION: 1. Hypermetabolism in the area of soft tissue thickening at the base of the tongue on the right side,  corresponding to the patient's known primary squamous cell carcinoma. There is also a malignant right-sided level 2 lymph node metastasis measuring approximately 2.2 x 2.4 cm. No definite signs of metastatic disease elsewhere noted in the neck, chest, abdomen or pelvis. 2. There are 2 foci of low-level hypermetabolism in the right hilar and subcarinal areas, without corresponding abnormality on the CT portion of the examination. Given the presence of several calcified mediastinal lymph nodes, this is favored to be inflammatory related to granulomatous disease, but attention on future followup examinations is recommended. 3. Small focus of hypermetabolism (SUVmax = 5.1) in the peripheral zone of the left side of the prostate gland. Correlation with PSA  levels and physical examination is recommended to exclude the possibility of prostate cancer. 4. Atherosclerosis, including left main and 3 vessel coronary artery disease. Assessment for potential risk factor modification, dietary therapy or pharmacologic therapy may be warranted, if clinically indicated. 5. Additional incidental findings, as above.   Electronically Signed   By: Vinnie Langton M.D.   On: 12/10/2014 14:42    Labs:  CBC:  Recent Labs  09/09/14 0838 12/18/14 0941 12/24/14 0755  WBC 7.7 7.8 7.0  HGB 15.5 15.1 15.3  HCT 48.1 47.2 46.5  PLT 230.0 199 189    COAGS:  Recent Labs  12/24/14 0755  INR 1.01  APTT 29    BMP:  Recent Labs  09/09/14 0838 12/18/14 0941 12/24/14 0755  NA 140 144 141  K 3.9 4.1 3.8  CL 108  --  109  CO2 24 26 27   GLUCOSE 102* 123 118*  BUN 19 15.7 15  CALCIUM 9.0 9.2 8.9  CREATININE 1.1 1.1 0.99  GFRNONAA  --   --  80*  GFRAA  --   --  >90    LIVER FUNCTION TESTS:  Recent Labs  09/09/14 0838 12/18/14 0941 12/24/14 0755  BILITOT 0.5 0.51 0.7  AST 30 19 21   ALT 41 23 25  ALKPHOS 75 60 63  PROT 6.4 6.4 6.9  ALBUMIN 3.9 3.6 4.0    TUMOR MARKERS: No results for input(s): AFPTM,  CEA, CA199, CHROMGRNA in the last 8760 hours.  Assessment and Plan: Nicholas Wilkerson is a 74 y.o. male with history of recently diagnosed base of tongue cancer who presents today for port a cath and percutaneous gastrostomy tube placements prior to chemoradiation. Details/risks of procedures, including but not limited to , internal bleeding, infection, venous thrombosis, injury to adjacent structures were d/w pt/family with their understanding and consent.      Signed: Autumn Messing 12/24/2014, 8:52 AM   I spent a total of 20 minutes face to face in clinical consultation, greater than 50% of which was counseling/coordinating care for port a cath and gastrostomy tube placements

## 2014-12-24 NOTE — Procedures (Signed)
Procedure:  Gastrostomy tube placement Findings:  73 Fr bumper retention gastrostomy tube placed in body of stomach. No complications. EBL <25 mL

## 2014-12-24 NOTE — Procedures (Signed)
Interventional Radiology Procedure Note  Procedure: Placement of a right IJ approach single lumen PowerPort.  Tip is positioned at the superior cavoatrial junction and catheter is ready for immediate use.  Complications: No immediate Recommendations:  - Ok to shower tomorrow - Do not submerge for 7 days - Routine line care   Juanangel Soderholm T. Jeidi Gilles, M.D Pager:  [phone]   

## 2014-12-24 NOTE — Discharge Instructions (Signed)
Gastrostomy Tube Home Guide A gastrostomy tube is a tube that is surgically placed into the stomach. It is also called a "G-tube." G-tubes are used when a person is unable to eat and drink enough on their own to stay healthy. The tube is inserted into the stomach through a small cut (incision) in the skin. This tube is used for:  Feeding.  Giving medication. GASTROSTOMY TUBE CARE  Wash your hands with soap and water.  Remove the old dressing (if any). Some styles of G-tubes may need a dressing inserted between the skin and the G-tube. Other types of G-tubes do not require a dressing. Ask your health care provider if a dressing is needed.  Check the area where the tube enters the skin (insertion site) for redness, swelling, or pus-like (purulent) drainage. A small amount of clear or tan liquid drainage is normal. Check to make sure scar tissue (skin) is not growing around the insertion site. This could have a raised, bumpy appearance.  A cotton swab can be used to clean the skin around the tube:  When the G-tube is first put in, a normal saline solution or water can be used to clean the skin.  Mild soap and warm water can be used when the skin around the G-tube site has healed.  Roll the cotton swab around the G-tube insertion site to remove any drainage or crusting at the insertion site. STOMACH RESIDUALS Feeding tube residuals are the amount of liquids that are in the stomach at any given time. Residuals may be checked before giving feedings, medications, or as instructed by your health care provider.  Ask your health care provider if there are instances when you would not start tube feedings depending on the amount or type of contents withdrawn from the stomach.  Check residuals by attaching a syringe to the G-tube and pulling back on the syringe plunger. Note the amount, and return the residual back into the stomach. FLUSHING THE G-TUBE  The G-tube should be periodically flushed with  clean warm water to keep it from clogging.  Flush the G-tube after feedings or medications. Draw up 30 mL of warm water in a syringe. Connect the syringe to the G-tube and slowly push the water into the tube.  Do not push feedings, medications, or flushes rapidly. Flush the G-tube gently and slowly.  Only use syringes made for G-tubes to flush medications or feedings.  Your health care provider may want the G-tube flushed more often or with more water. If this is the case, follow your health care provider's instructions. FEEDINGS Your health care provider will determine whether feedings are given as a bolus (a certain amount given at one time and at scheduled times) or whether feedings will be given continuously on a feeding pump.   Formulas should be given at room temperature.  If feedings are continuous, no more than 4 hours worth of feedings should be placed in the feeding bag. This helps prevent spoilage or accidental excess infusion.  Cover and place unused formula in the refrigerator.  If feedings are continuous, stop the feedings when medications or flushes are given. Be sure to restart the feedings.  Feeding bags and syringes should be replaced as instructed by your health care provider. GIVING MEDICATION   In general, it is best if all medications are in a liquid form for G-tube administration. Liquid medications are less likely to clog the G-tube.  Mix the liquid medication with 30 mL (or amount recommended by your  health care provider) of warm water.  Draw up the medication into the syringe.  Attach the syringe to the G-tube and slowly push the mixture into the G-tube.  After giving the medication, draw up 30 mL of warm water in the syringe and slowly flush the G-tube.  For pills or capsules, check with your health care provider first before crushing medications. Some pills are not effective if they are crushed. Some capsules are sustained-release medications.  If  appropriate, crush the pill or capsule and mix with 30 mL of warm water. Using the syringe, slowly push the medication through the tube, then flush the tube with another 30 mL of tap water. G-TUBE PROBLEMS G-tube was pulled out.  Cause: May have been pulled out accidentally.  Solutions: Cover the opening with clean dressing and tape. Call your health care provider right away. The G-tube should be put in as soon as possible (within 4 hours) so the G-tube opening (tract) does not close. The G-tube needs to be put in at a health care setting. An X-ray needs to be done to confirm placement before the G-tube can be used again. Redness, irritation, soreness, or foul odor around the gastrostomy site.  Cause: May be caused by leakage or infection.  Solutions: Call your health care provider right away. Large amount of leakage of fluid or mucus-like liquid present (a large amount means it soaks clothing).  Cause: Many reasons could cause the G-tube to leak.  Solutions: Call your health care provider to discuss the amount of leakage. Skin or scar tissue appears to be growing where tube enters skin.   Cause: Tissue growth may develop around the insertion site if the G-tube is moved or pulled on excessively.  Solutions: Secure tube with tape so that excess movement does not occur. Call your health care provider. G-tube is clogged.  Cause: Thick formula or medication.  Solutions: Try to slowly push warm water into the tube with a large syringe. Never try to push any object into the tube to unclog it. Do not force fluid into the G-tube. If you are unable to unclog the tube, call your health care provider right away. TIPS  Head of bed (HOB) position refers to the upright position of a person's upper body.  When giving medications or a feeding bolus, keep the [facility] up as told by your health care provider. Do this during the feeding and for 1 hour after the feeding or medication administration.  If  continuous feedings are being given, it is best to keep the [facility] up as told by your health care provider. When ADLs (activities of daily living) are performed and the [facility] needs to be flat, be sure to turn the feeding pump off. Restart the feeding pump when the [facility] is returned to the recommended height.  Do not pull or put tension on the tube.  To prevent fluid backflow, kink the G-tube before removing the cap or disconnecting a syringe.  Check the G-tube length every day. Measure from the insertion site to the end of the G-tube. If the length is longer than previous measurements, the tube may be coming out. Call your health care provider if you notice increasing G-tube length.  Oral care, such as brushing teeth, must be continued.  You may need to remove excess air (vent) from the G-tube. Your health care provider will tell you if this is needed.  Always call your health care provider if you have questions or problems with the G-tube.  SEEK IMMEDIATE MEDICAL CARE IF:   You have severe abdominal pain, tenderness, or abdominal bloating (distension).  You have nausea or vomiting.  You are constipated or have problems moving your bowels.  The G-tube insertion site is red, swollen, has a foul smell, or has yellow or brown drainage.  You have difficulty breathing or shortness of breath.  You have a fever.  You have a large amount of feeding tube residuals.  The G-tube is clogged and cannot be flushed. MAKE SURE YOU:   Understand these instructions.  Will watch your condition.  Will get help right away if you are not doing well or get worse. Document Released: 12/06/2001 Document Revised: 02/11/2014 Document Reviewed: 06/04/2013 [facility] Patient Information 2015 [place], [place]. This information is not intended to replace advice given to you by your health care provider. Make sure you discuss any questions you have with your health care provider.  Implanted Port Insertion, Care  After Refer to this sheet in the next few weeks. These instructions provide you with information on caring for yourself after your procedure. Your health care provider may also give you more specific instructions. Your treatment has been planned according to current medical practices, but problems sometimes occur. Call your health care provider if you have any problems or questions after your procedure. WHAT TO EXPECT AFTER THE PROCEDURE After your procedure, it is typical to have the following:   Discomfort at the port insertion site. Ice packs to the area will help.  Bruising on the skin over the port. This will subside in 3-4 days. HOME CARE INSTRUCTIONS  After your port is placed, you will get a manufacturer's information card. The card has information about your port. Keep this card with you at all times.   Know what kind of port you have. There are many types of ports available.   Wear a medical alert bracelet in case of an emergency. This can help alert health care workers that you have a port.   The port can stay in for as long as your health care provider believes it is necessary.   A home health care nurse may give medicines and take care of the port.   You or a family member can get special training and directions for giving medicine and taking care of the port at home.  SEEK MEDICAL CARE IF:   Your port does not flush or you are unable to get a blood return.   You have a fever or chills. SEEK IMMEDIATE MEDICAL CARE IF:  You have new fluid or pus coming from your incision.   You notice a bad smell coming from your incision site.   You have swelling, pain, or more redness at the incision or port site.   You have chest pain or shortness of breath. Document Released: 07/18/2013 Document Revised: 10/02/2013 Document Reviewed: 07/18/2013 [facility] Patient Information 2015 [place], [place]. This information is not intended to replace advice given to you by your health  care provider. Make sure you discuss any questions you have with your health care provider. Implanted [facility] Guide An implanted port is a type of central line that is placed under the skin. Central lines are used to provide IV access when treatment or nutrition needs to be given through a person's veins. Implanted ports are used for long-term IV access. An implanted port may be placed because:   You need IV medicine that would be irritating to the small veins in your hands or arms.  You need long-term IV medicines, such as antibiotics.   You need IV nutrition for a long period.   You need frequent blood draws for lab tests.   You need dialysis.  Implanted ports are usually placed in the chest area, but they can also be placed in the upper arm, the abdomen, or the leg. An implanted port has two main parts:   Reservoir. The reservoir is round and will appear as a small, raised area under your skin. The reservoir is the part where a needle is inserted to give medicines or draw blood.   Catheter. The catheter is a thin, flexible tube that extends from the reservoir. The catheter is placed into a large vein. Medicine that is inserted into the reservoir goes into the catheter and then into the vein.  HOW WILL I CARE FOR MY INCISION SITE? Do not get the incision site wet. Bathe or shower as directed by your health care provider.  HOW IS MY PORT ACCESSED? Special steps must be taken to access the port:   Before the port is accessed, a numbing cream can be placed on the skin. This helps numb the skin over the port site.   Your health care provider uses a sterile technique to access the port.  Your health care provider must put on a mask and sterile gloves.  The skin over your port is cleaned carefully with an antiseptic and allowed to dry.  The port is gently pinched between sterile gloves, and a needle is inserted into the port.  Only "non-coring" port needles should be used to  access the port. Once the port is accessed, a blood return should be checked. This helps ensure that the port is in the vein and is not clogged.   If your port needs to remain accessed for a constant infusion, a clear (transparent) bandage will be placed over the needle site. The bandage and needle will need to be changed every week, or as directed by your health care provider.   Keep the bandage covering the needle clean and dry. Do not get it wet. Follow your health care provider's instructions on how to take a shower or bath while the port is accessed.   If your port does not need to stay accessed, no bandage is needed over the port.  WHAT IS FLUSHING? Flushing helps keep the port from getting clogged. Follow your health care provider's instructions on how and when to flush the port. Ports are usually flushed with saline solution or a medicine called heparin. The need for flushing will depend on how the port is used.   If the port is used for intermittent medicines or blood draws, the port will need to be flushed:   After medicines have been given.   After blood has been drawn.   As part of routine maintenance.   If a constant infusion is running, the port may not need to be flushed.  HOW LONG WILL MY PORT STAY IMPLANTED? The port can stay in for as long as your health care provider thinks it is needed. When it is time for the port to come out, surgery will be done to remove it. The procedure is similar to the one performed when the port was put in.  WHEN SHOULD I SEEK IMMEDIATE MEDICAL CARE? When you have an implanted port, you should seek immediate medical care if:   You notice a bad smell coming from the incision site.   You have swelling, redness,  or drainage at the incision site.   You have more swelling or pain at the port site or the surrounding area.   You have a fever that is not controlled with medicine. Document Released: 09/27/2005 Document Revised:  07/18/2013 Document Reviewed: 06/04/2013 [facility] Patient Information 2015 [place], [place]. This information is not intended to replace advice given to you by your health care provider. Make sure you discuss any questions you have with your health care provider.  Conscious Sedation, Adult, Care After Refer to this sheet in the next few weeks. These instructions provide you with information on caring for yourself after your procedure. Your health care provider may also give you more specific instructions. Your treatment has been planned according to current medical practices, but problems sometimes occur. Call your health care provider if you have any problems or questions after your procedure. WHAT TO EXPECT AFTER THE PROCEDURE  After your procedure:  You may feel sleepy, clumsy, and have poor balance for several hours.  Vomiting may occur if you eat too soon after the procedure. HOME CARE INSTRUCTIONS  Do not participate in any activities where you could become injured for at least 24 hours. Do not:  Drive.  Swim.  Ride a bicycle.  Operate heavy machinery.  Cook.  Use power tools.  Climb ladders.  Work from a high place.  Do not make important decisions or sign legal documents until you are improved.  If you vomit, drink water, juice, or soup when you can drink without vomiting. Make sure you have little or no nausea before eating solid foods.  Only take over-the-counter or prescription medicines for pain, discomfort, or fever as directed by your health care provider.  Make sure you and your family fully understand everything about the medicines given to you, including what side effects may occur.  You should not drink alcohol, take sleeping pills, or take medicines that cause drowsiness for at least 24 hours.  If you smoke, do not smoke without supervision.  If you are feeling better, you may resume normal activities 24 hours after you were sedated.  Keep all  appointments with your health care provider. SEEK MEDICAL CARE IF:  Your skin is pale or bluish in color.  You continue to feel nauseous or vomit.  Your pain is getting worse and is not helped by medicine.  You have bleeding or swelling.  You are still sleepy or feeling clumsy after 24 hours. SEEK IMMEDIATE MEDICAL CARE IF:  You develop a rash.  You have difficulty breathing.  You develop any type of allergic problem.  You have a fever. MAKE SURE YOU:  Understand these instructions.  Will watch your condition.  Will get help right away if you are not doing well or get worse. Document Released: 07/18/2013 Document Reviewed: 07/18/2013 [facility] Patient Information 2015 [place] [employer], [place]. This information is not intended to replace advice given to you by your health care provider. Make sure you discuss any questions you have with your health care provider.

## 2014-12-25 DIAGNOSIS — Z510 Encounter for antineoplastic radiation therapy: Secondary | ICD-10-CM | POA: Diagnosis not present

## 2014-12-26 ENCOUNTER — Telehealth: Payer: Self-pay | Admitting: Family Medicine

## 2014-12-26 MED ORDER — TAMSULOSIN HCL 0.4 MG PO CAPS: 0.4000 mg | ORAL_CAPSULE | Freq: Every day | ORAL | Status: DC

## 2014-12-26 NOTE — Telephone Encounter (Signed)
Call in Flomax 0.4 mg daily, #30 with 5 rf

## 2014-12-26 NOTE — Telephone Encounter (Signed)
Pt aware and does not think that this is the medication that Dr. Sarajane Jews mentioned to him before. He said it is suppose to be for enlarged prostate. He is a little upset because he was hoping that Dr. Sarajane Jews would call him back personally. I advised the pt that Dr. Sarajane Jews doesn't generally call pts. I let him know that I would send message to Dr. Sarajane Jews to ensure that this is indeed the medication he should be taking. The pt states that he does not want to take anymore "junk" because he is going though a lot with the cancer and he is not getting any rest at night due to having to get up 6-8 times at night.  Advised pt that we would give him a call back tomorrow  Please advise if this is the correct medication.

## 2014-12-26 NOTE — Telephone Encounter (Signed)
Pt called to say that since he has started taking chemo he has so much fluid from drinking 60 to 80 oz of water. He said this keeps him up all night because he has to go to the bathroom. He said [name] told him there is something he can put him on that will help him. He is requesting that this be called in for him.    Pharmacy [facility]

## 2014-12-27 DIAGNOSIS — Z510 Encounter for antineoplastic radiation therapy: Secondary | ICD-10-CM | POA: Diagnosis not present

## 2014-12-30 NOTE — Telephone Encounter (Signed)
Yes this is what I wanted him to try

## 2015-01-01 ENCOUNTER — Inpatient Hospital Stay
Admission: RE | Admit: 2015-01-01 | Discharge: 2015-01-01 | Disposition: A | Discharge status: Home / Self Care | Payer: Self-pay | Source: Ambulatory Visit | Attending: Radiation Oncology | Admitting: Radiation Oncology

## 2015-01-01 ENCOUNTER — Ambulatory Visit: Payer: Medicare Other | Admitting: Nutrition

## 2015-01-01 ENCOUNTER — Ambulatory Visit (HOSPITAL_BASED_OUTPATIENT_CLINIC_OR_DEPARTMENT_OTHER): Payer: Medicare Other

## 2015-01-01 ENCOUNTER — Other Ambulatory Visit: Payer: Self-pay | Admitting: *Deleted

## 2015-01-01 ENCOUNTER — Encounter: Payer: Self-pay | Admitting: *Deleted

## 2015-01-01 ENCOUNTER — Ambulatory Visit
Admission: RE | Admit: 2015-01-01 | Discharge: 2015-01-01 | Disposition: A | Discharge status: Home / Self Care | Payer: Medicare Other | Source: Ambulatory Visit | Attending: Radiation Oncology | Admitting: Radiation Oncology

## 2015-01-01 DIAGNOSIS — C01 Malignant neoplasm of base of tongue: Secondary | ICD-10-CM

## 2015-01-01 DIAGNOSIS — Z5111 Encounter for antineoplastic chemotherapy: Secondary | ICD-10-CM | POA: Diagnosis not present

## 2015-01-01 DIAGNOSIS — Z510 Encounter for antineoplastic radiation therapy: Secondary | ICD-10-CM | POA: Diagnosis not present

## 2015-01-01 MED ORDER — POTASSIUM CHLORIDE 2 MEQ/ML IV SOLN
Freq: Once | INTRAVENOUS | Status: AC
  Administered 2015-01-01: 09:00:00 via INTRAVENOUS
  Filled 2015-01-01: qty 10

## 2015-01-01 MED ORDER — HEPARIN SOD (PORK) LOCK FLUSH 100 UNIT/ML IV SOLN
500.0000 [IU] | Freq: Once | INTRAVENOUS | Status: AC | PRN
  Administered 2015-01-01: 500 [IU]
  Filled 2015-01-01: qty 5

## 2015-01-01 MED ORDER — RADIAPLEXRX EX GEL: Freq: Once | CUTANEOUS | Status: DC

## 2015-01-01 MED ORDER — PALONOSETRON HCL INJECTION 0.25 MG/5ML
INTRAVENOUS | Status: AC
  Filled 2015-01-01: qty 5

## 2015-01-01 MED ORDER — PALONOSETRON HCL INJECTION 0.25 MG/5ML
0.2500 mg | Freq: Once | INTRAVENOUS | Status: AC
  Administered 2015-01-01: 0.25 mg via INTRAVENOUS

## 2015-01-01 MED ORDER — SODIUM CHLORIDE 0.9 % IV SOLN
Freq: Once | INTRAVENOUS | Status: AC
  Administered 2015-01-01: 11:00:00 via INTRAVENOUS
  Filled 2015-01-01: qty 5

## 2015-01-01 MED ORDER — SODIUM CHLORIDE 0.9 % IJ SOLN
10.0000 mL | INTRAMUSCULAR | Status: DC | PRN
  Administered 2015-01-01: 10 mL
  Filled 2015-01-01: qty 10

## 2015-01-01 MED ORDER — CISPLATIN CHEMO INJECTION 100MG/100ML
99.0000 mg/m2 | Freq: Once | INTRAVENOUS | Status: AC
  Administered 2015-01-01: 200 mg via INTRAVENOUS
  Filled 2015-01-01: qty 200

## 2015-01-01 NOTE — Telephone Encounter (Signed)
Spoke with pt and pt is aware.  Pt states "it is working fine."

## 2015-01-01 NOTE — Patient Instructions (Signed)
[facility] Discharge Instructions for Patients Receiving Chemotherapy  Today you received the following chemotherapy agents: Cisplatin   To help prevent nausea and vomiting after your treatment, we encourage you to take your nausea medication as directed.    If you develop nausea and vomiting that is not controlled by your nausea medication, call the clinic.   BELOW ARE SYMPTOMS THAT SHOULD BE REPORTED IMMEDIATELY:  *FEVER GREATER THAN 100.5 F  *CHILLS WITH OR WITHOUT FEVER  NAUSEA AND VOMITING THAT IS NOT CONTROLLED WITH YOUR NAUSEA MEDICATION  *UNUSUAL SHORTNESS OF BREATH  *UNUSUAL BRUISING OR BLEEDING  TENDERNESS IN MOUTH AND THROAT WITH OR WITHOUT PRESENCE OF ULCERS  *URINARY PROBLEMS  *BOWEL PROBLEMS  UNUSUAL RASH Items with * indicate a potential emergency and should be followed up as soon as possible.  Feel free to call the clinic you have any questions or concerns. The clinic phone number is [phone].  Please show the CHEMO ALERT CARD at check-in to the Emergency Department and triage nurse.   

## 2015-01-01 NOTE — Progress Notes (Signed)
To provide support and encouragement, care continuity and to assess for needs, met with patient during initial Cisplatin infusion and New Start tomo. 1. He reported minor discomfort and annoyance with PEG.  He is flushing daily with 30 cc water, using plunger.  We discussed "gravity" technique for flushing.  I encouraged him to try. 2.  I provided him PEG care supplies - split gauze, Medipore tape, saline, applicators. 3.  He tolerated first RT without incident.   Gayleen Orem, RN, BSN, [place] [facility] [place] [phone]

## 2015-01-01 NOTE — Telephone Encounter (Signed)
Left a message for return call.  

## 2015-01-01 NOTE — Telephone Encounter (Signed)
Thank you.  Lars Mage, will you please give this pt a call for me?

## 2015-01-01 NOTE — Progress Notes (Signed)
Per Dr. Alvy Bimler okay to use labs from 12/24/14.

## 2015-01-01 NOTE — Progress Notes (Signed)
Follow-up completed with patient and wife.He is receiving concurrent chemoradiation therapy for tongue cancer. Patient is status post PEG placement. Patient reports he understands how to care for PEG and is flushing daily. Patient really does not want to use feeding tube if it is not necessary. Weight is stable and documented as 185.6 pounds March 14 from 185 pounds March 2.  Nutrition diagnosis: Predicted suboptimal intake continues.  Intervention:  Provided support and encouragement for patient to continue high-calorie high-protein foods in small amounts. Enforced importance of flushing feeding tube daily. Encouraged patient to continue swallowing exercises and increased hydration. Teach back method used.  Monitoring, evaluation, goals: Patient will work to continue adequate calories and protein for weight maintenance.  Next visit: Wednesday, April 13, during chemotherapy.  **Disclaimer: This note was dictated with voice recognition software. Similar sounding words can inadvertently be transcribed and this note may contain transcription errors which may not have been corrected upon publication of note.**

## 2015-01-02 ENCOUNTER — Ambulatory Visit
Admission: RE | Admit: 2015-01-02 | Discharge: 2015-01-02 | Disposition: A | Discharge status: Home / Self Care | Payer: Medicare Other | Source: Ambulatory Visit | Attending: Radiation Oncology | Admitting: Radiation Oncology

## 2015-01-02 DIAGNOSIS — C01 Malignant neoplasm of base of tongue: Secondary | ICD-10-CM | POA: Insufficient documentation

## 2015-01-02 DIAGNOSIS — Z510 Encounter for antineoplastic radiation therapy: Secondary | ICD-10-CM | POA: Diagnosis not present

## 2015-01-02 MED ORDER — RADIAPLEXRX EX GEL
Freq: Once | CUTANEOUS | Status: AC
  Administered 2015-01-02: 16:00:00 via TOPICAL

## 2015-01-02 NOTE — Progress Notes (Signed)
Patient education completed with patient and his wife. Gave him "Radiation and You" booklet with all pertinent information marked and discussed, re: fatigue, hair loss/care, mouth irritation/management, nausea/ management, skin irritation/care, throat irritation/care, nutrition, pain. Gave pt Radiaplex with instructions for proper use. Teach back method used. Pt and wife verbalized understanding.

## 2015-01-03 ENCOUNTER — Encounter: Payer: Self-pay | Admitting: *Deleted

## 2015-01-03 ENCOUNTER — Ambulatory Visit
Admission: RE | Admit: 2015-01-03 | Discharge: 2015-01-03 | Disposition: A | Discharge status: Home / Self Care | Payer: Medicare Other | Source: Ambulatory Visit | Attending: Radiation Oncology | Admitting: Radiation Oncology

## 2015-01-03 DIAGNOSIS — Z510 Encounter for antineoplastic radiation therapy: Secondary | ICD-10-CM | POA: Diagnosis not present

## 2015-01-03 NOTE — Progress Notes (Signed)
IMRT Device Note 01/01/15    ICD-9-CM ICD-10-CM   1. Cancer of base of tongue 141.0 C01     9.5 delivered field widths represent one set of IMRT treatment devices. The code is [phone].  -----------------------------------  Eppie Gibson, MD

## 2015-01-03 NOTE — Progress Notes (Signed)
Mr. Vercher and his spouse reported a "bad odor" from the Peg tubing, hiccups, indigestion, and intermittent nausea today.  His Peg site is clean, and when the valve was opened, their is a mild unoffensive odor. He flushes his peg daily to maintain patency and reports ease when flushing.  Cameo, RN for Dr. Alvy Bimler stated that Ativan may assist with his hiccups for which he was informed, and he was also instructed to take Prilosec OTC and compazine as ordered to manage his nausea.  Dr. Isidore Moos updated.  Given this RN's card and instructed to call the [phone] number if he has any concerns or changes in status over the weekend.

## 2015-01-06 ENCOUNTER — Encounter: Payer: Self-pay | Admitting: Radiation Oncology

## 2015-01-06 ENCOUNTER — Telehealth: Payer: Self-pay | Admitting: *Deleted

## 2015-01-06 ENCOUNTER — Ambulatory Visit
Admission: RE | Admit: 2015-01-06 | Discharge: 2015-01-06 | Disposition: A | Discharge status: Home / Self Care | Payer: Medicare Other | Source: Ambulatory Visit | Attending: Radiation Oncology | Admitting: Radiation Oncology

## 2015-01-06 VITALS — BP 129/79 | HR 75 | Temp 98.8°F | Ht 69.0 in | Wt 175.3 lb

## 2015-01-06 DIAGNOSIS — C01 Malignant neoplasm of base of tongue: Secondary | ICD-10-CM

## 2015-01-06 DIAGNOSIS — Z510 Encounter for antineoplastic radiation therapy: Secondary | ICD-10-CM | POA: Diagnosis not present

## 2015-01-06 NOTE — Progress Notes (Signed)
   Weekly Management Note:  Outpatient    ICD-9-CM ICD-10-CM   1. Cancer of base of tongue 141.0 C01     Current Dose:  8 Gy  Projected Dose: 70 Gy   Narrative:  The patient presents for routine under treatment assessment.  CBCT/MVCT images/Port film x-rays were reviewed.  The chart was checked. Nicholas Wilkerson has received 4 fractions to his BOT/Bilateral Neck.   He had chemotherapy last Wednesday and, as a result, he was not able to eat this weekend and has lost 10 lbs since 12/23/14.  He states he is now able to eat. He denies any pain, and states he is not as fatigued as he was over the weekend.   Physical Findings:  Wt Readings from Last 3 Encounters:  01/06/15 175 lb 4.8 oz (79.516 kg)  12/17/14 185 lb 11.2 oz (84.233 kg)  12/11/14 185 lb (83.915 kg)    height is 5\' 9"  (1.753 m) and weight is 175 lb 4.8 oz (79.516 kg). His temperature is 98.8 F (37.1 C). His blood pressure is 129/79 and his pulse is 75.  No oral lesions. Palpable right neck mass  CBC    Component Value Date/Time   WBC 7.0 12/24/2014 0755   WBC 7.8 12/18/2014 0941   RBC 5.19 12/24/2014 0755   RBC 5.26 12/18/2014 0941   HGB 15.3 12/24/2014 0755   HGB 15.1 12/18/2014 0941   HCT 46.5 12/24/2014 0755   HCT 47.2 12/18/2014 0941   PLT 189 12/24/2014 0755   PLT 199 12/18/2014 0941   MCV 89.6 12/24/2014 0755   MCV 89.7 12/18/2014 0941   MCH 29.5 12/24/2014 0755   MCH 28.7 12/18/2014 0941   MCHC 32.9 12/24/2014 0755   MCHC 32.0 12/18/2014 0941   RDW 13.8 12/24/2014 0755   RDW 14.7* 12/18/2014 0941   LYMPHSABS 1.7 12/24/2014 0755   LYMPHSABS 1.2 12/18/2014 0941   MONOABS 0.7 12/24/2014 0755   MONOABS 0.4 12/18/2014 0941   EOSABS 0.1 12/24/2014 0755   EOSABS 0.1 12/18/2014 0941   BASOSABS 0.1 12/24/2014 0755   BASOSABS 0.1 12/18/2014 0941     CMP     Component Value Date/Time   NA 141 12/24/2014 0755   NA 144 12/18/2014 0941   K 3.8 12/24/2014 0755   K 4.1 12/18/2014 0941   CL 109 12/24/2014 0755   CO2 27 12/24/2014 0755   CO2 26 12/18/2014 0941   GLUCOSE 118* 12/24/2014 0755   GLUCOSE 123 12/18/2014 0941   BUN 15 12/24/2014 0755   BUN 15.7 12/18/2014 0941   CREATININE 0.99 12/24/2014 0755   CREATININE 1.1 12/18/2014 0941   CALCIUM 8.9 12/24/2014 0755   CALCIUM 9.2 12/18/2014 0941   PROT 6.9 12/24/2014 0755   PROT 6.4 12/18/2014 0941   ALBUMIN 4.0 12/24/2014 0755   ALBUMIN 3.6 12/18/2014 0941   AST 21 12/24/2014 0755   AST 19 12/18/2014 0941   ALT 25 12/24/2014 0755   ALT 23 12/18/2014 0941   ALKPHOS 63 12/24/2014 0755   ALKPHOS 60 12/18/2014 0941   BILITOT 0.7 12/24/2014 0755   BILITOT 0.51 12/18/2014 0941   GFRNONAA 80* 12/24/2014 0755   GFRAA >90 12/24/2014 0755     Impression:  The patient is tolerating radiotherapy.   Plan:  Continue radiotherapy as planned. PCR testing positive for HPV16. Discussed this w/ patient and notified Dr Alvy Bimler. -----------------------------------  Eppie Gibson, MD

## 2015-01-06 NOTE — Progress Notes (Addendum)
Nicholas Wilkerson has received 4 fractions to his BOT/Bilateral Neck.   He had chemotherapy last Wednesday and, as a result, he was not able to eat this weekend and has lost 10 lbs since 12/23/14.  He states he is now able to eat. He denies any pain, and states he is not as fatigued as he was over the weekend.

## 2015-01-06 NOTE — Telephone Encounter (Signed)
Called patient for chemo follow up. Had bad days Friday, Saturday and Sunday- nausea, no vomiting. Feels better today. Is eating and drinking fine. Denies diarrhea

## 2015-01-06 NOTE — Telephone Encounter (Signed)
-----   Message from Oliver Hum, RN sent at 01/01/2015 11:48 AM EDT ----- Regarding: NG- Chemo Follow up Call First time Cisplatin. Dr. Alvy Bimler

## 2015-01-07 ENCOUNTER — Ambulatory Visit
Admission: RE | Admit: 2015-01-07 | Discharge: 2015-01-07 | Disposition: A | Discharge status: Home / Self Care | Payer: Medicare Other | Source: Ambulatory Visit | Attending: Radiation Oncology | Admitting: Radiation Oncology

## 2015-01-07 ENCOUNTER — Encounter: Payer: Self-pay | Admitting: Hematology and Oncology

## 2015-01-07 ENCOUNTER — Ambulatory Visit (HOSPITAL_BASED_OUTPATIENT_CLINIC_OR_DEPARTMENT_OTHER): Payer: Medicare Other | Admitting: Hematology and Oncology

## 2015-01-07 ENCOUNTER — Telehealth: Payer: Self-pay | Admitting: Hematology and Oncology

## 2015-01-07 VITALS — BP 125/74 | HR 77 | Temp 98.3°F | Resp 18 | Ht 69.0 in | Wt 175.9 lb

## 2015-01-07 DIAGNOSIS — R634 Abnormal weight loss: Secondary | ICD-10-CM

## 2015-01-07 DIAGNOSIS — R110 Nausea: Secondary | ICD-10-CM | POA: Diagnosis not present

## 2015-01-07 DIAGNOSIS — T451X5A Adverse effect of antineoplastic and immunosuppressive drugs, initial encounter: Secondary | ICD-10-CM

## 2015-01-07 DIAGNOSIS — C01 Malignant neoplasm of base of tongue: Secondary | ICD-10-CM

## 2015-01-07 DIAGNOSIS — Z510 Encounter for antineoplastic radiation therapy: Secondary | ICD-10-CM | POA: Diagnosis not present

## 2015-01-07 DIAGNOSIS — R112 Nausea with vomiting, unspecified: Secondary | ICD-10-CM | POA: Insufficient documentation

## 2015-01-07 NOTE — Telephone Encounter (Signed)
Pt confirmed labs/ov per 03/29 POF, gave pt AVS and Calendar.... KJ °

## 2015-01-07 NOTE — Progress Notes (Signed)
[facility] OFFICE PROGRESS NOTE  Patient Care Team: Laurey Morale, MD as PCP - General (Family Medicine) Heath Lark, MD as Consulting Physician (Hematology and Oncology)  SUMMARY OF ONCOLOGIC HISTORY: Oncology History   Cancer of base of tongue   Staging form: Lip and Oral Cavity, AJCC 7th Edition     Clinical stage from 12/03/2014: Stage IVA (T2, N2a, M0) - Signed by Heath Lark, MD on 12/13/2014 HPV negative           Cancer of base of tongue   11/20/2014 Pathology Results Accession: [id number] confirm squamous cell carcinoma.   11/20/2014 Procedure The patient underwent fine-needle aspirate of the right lymph node   12/02/2014 Imaging CT scan showed 2.2 cm the right tongue base mass with necrotic 4 cm right level II nodal metastasis with multiple bilateral small lymphadenopathy   12/10/2014 Imaging PET CT scan showed tongue base mass with right level II lymph node metastasis   12/24/2014 Procedure He has placement of port and feeding tube   01/01/2015 -  Chemotherapy He received high dose cisplatin   01/01/2015 -  Radiation Therapy He received concurrent radiation treatment    INTERVAL HISTORY: Please see below for problem oriented charting. He is seen as part of his weekly supportive care visit. He had profound nausea but no vomiting since chemotherapy last week along with altered taste sensation. He has lost some weight since I saw him. He is mildly constipated.  REVIEW OF SYSTEMS:   Constitutional: Denies fevers, chills  Eyes: Denies blurriness of vision Ears, nose, mouth, throat, and face: Denies mucositis or sore throat Respiratory: Denies cough, dyspnea or wheezes Cardiovascular: Denies palpitation, chest discomfort or lower extremity swelling Skin: Denies abnormal skin rashes Lymphatics: Denies new lymphadenopathy or easy bruising Neurological:Denies numbness, tingling or new weaknesses Behavioral/Psych: Mood is stable, no new changes  All other systems  were reviewed with the patient and are negative.  I have reviewed the past medical history, past surgical history, social history and family history with the patient and they are unchanged from previous note.  ALLERGIES:  has No Known Allergies.  MEDICATIONS:  Current Outpatient Prescriptions  Medication Sig Dispense Refill  . ASPIRIN PO Take 81 mg by mouth every morning.     . carvedilol (COREG) 25 MG tablet Take 1 tablet (25 mg total) by mouth 2 (two) times daily with a meal. 180 tablet 3  . hyaluronate sodium (RADIAPLEXRX) GEL Apply 1 application topically 2 (two) times daily.    Marland Kitchen lidocaine-prilocaine (EMLA) cream Apply to affected area once 30 g 3  . LORazepam (ATIVAN) 0.5 MG tablet Take 1-2 tablets PRN claustrophobia; take 20 minutes before wearing radiotherapy mask. 36 tablet 0  . multivitamin (THERAGRAN) per tablet Take 1 tablet by mouth every morning.     . ondansetron (ZOFRAN) 8 MG tablet Take 1 tablet (8 mg total) by mouth every 8 (eight) hours as needed. 30 tablet 1  . prochlorperazine (COMPAZINE) 10 MG tablet Take 1 tablet (10 mg total) by mouth every 6 (six) hours as needed (Nausea or vomiting). 30 tablet 1  . simvastatin (ZOCOR) 40 MG tablet Take 1 tablet (40 mg total) by mouth at bedtime. 90 tablet 3  . sodium fluoride (FLUORISHIELD) 1.1 % GEL dental gel Instill one drop of gel per tooth space of fluoride tray. Place over teeth for 5 minutes. Remove. Spit out excess. Repeat nightly. 120 mL prn  . tadalafil (CIALIS) 20 MG tablet Take 1 tablet (20 mg  total) by mouth daily as needed for erectile dysfunction. 30 tablet 3  . TRAVATAN Z 0.004 % SOLN ophthalmic solution Place 1 drop into both eyes at bedtime.     . tamsulosin (FLOMAX) 0.4 MG CAPS capsule Take 1 capsule (0.4 mg total) by mouth daily. 30 capsule 5   No current facility-administered medications for this visit.   Facility-Administered Medications Ordered in Other Visits  Medication Dose Route Frequency Provider Last  Rate Last Dose  . hyaluronate sodium (RADIAPLEXRX) gel   Topical Once Eppie Gibson, MD        PHYSICAL EXAMINATION: ECOG PERFORMANCE STATUS: 1 - Symptomatic but completely ambulatory  Filed Vitals:   01/07/15 1446  BP: 125/74  Pulse: 77  Temp: 98.3 F (36.8 C)  Resp: 18   Filed Weights   01/07/15 1446  Weight: 175 lb 14.4 oz (79.788 kg)    GENERAL:alert, no distress and comfortable SKIN: skin color, texture, turgor are normal, no rashes or significant lesions EYES: normal, Conjunctiva are pink and non-injected, sclera clear OROPHARYNX:no exudate, no erythema and lips, buccal mucosa, and tongue normal . Dry mucous membrane is noted NECK: supple, thyroid normal size, non-tender, without nodularity LYMPH:  Persistent palpable lymphadenopathy in the right cervical region. None elsewhere. LUNGS: clear to auscultation and percussion with normal breathing effort HEART: regular rate & rhythm and no murmurs and no lower extremity edema ABDOMEN:abdomen soft, non-tender and normal bowel sounds. Feeding tube site looks okay Musculoskeletal:no cyanosis of digits and no clubbing  NEURO: alert & oriented x 3 with fluent speech, no focal motor/sensory deficits  LABORATORY DATA:  I have reviewed the data as listed    Component Value Date/Time   NA 141 12/24/2014 0755   NA 144 12/18/2014 0941   K 3.8 12/24/2014 0755   K 4.1 12/18/2014 0941   CL 109 12/24/2014 0755   CO2 27 12/24/2014 0755   CO2 26 12/18/2014 0941   GLUCOSE 118* 12/24/2014 0755   GLUCOSE 123 12/18/2014 0941   BUN 15 12/24/2014 0755   BUN 15.7 12/18/2014 0941   CREATININE 0.99 12/24/2014 0755   CREATININE 1.1 12/18/2014 0941   CALCIUM 8.9 12/24/2014 0755   CALCIUM 9.2 12/18/2014 0941   PROT 6.9 12/24/2014 0755   PROT 6.4 12/18/2014 0941   ALBUMIN 4.0 12/24/2014 0755   ALBUMIN 3.6 12/18/2014 0941   AST 21 12/24/2014 0755   AST 19 12/18/2014 0941   ALT 25 12/24/2014 0755   ALT 23 12/18/2014 0941   ALKPHOS 63  12/24/2014 0755   ALKPHOS 60 12/18/2014 0941   BILITOT 0.7 12/24/2014 0755   BILITOT 0.51 12/18/2014 0941   GFRNONAA 80* 12/24/2014 0755   GFRAA >90 12/24/2014 0755    No results found for: SPEP, UPEP  Lab Results  Component Value Date   WBC 7.0 12/24/2014   NEUTROABS 4.4 12/24/2014   HGB 15.3 12/24/2014   HCT 46.5 12/24/2014   MCV 89.6 12/24/2014   PLT 189 12/24/2014      Chemistry      Component Value Date/Time   NA 141 12/24/2014 0755   NA 144 12/18/2014 0941   K 3.8 12/24/2014 0755   K 4.1 12/18/2014 0941   CL 109 12/24/2014 0755   CO2 27 12/24/2014 0755   CO2 26 12/18/2014 0941   BUN 15 12/24/2014 0755   BUN 15.7 12/18/2014 0941   CREATININE 0.99 12/24/2014 0755   CREATININE 1.1 12/18/2014 0941      Component Value Date/Time   CALCIUM  8.9 12/24/2014 0755   CALCIUM 9.2 12/18/2014 0941   ALKPHOS 63 12/24/2014 0755   ALKPHOS 60 12/18/2014 0941   AST 21 12/24/2014 0755   AST 19 12/18/2014 0941   ALT 25 12/24/2014 0755   ALT 23 12/18/2014 0941   BILITOT 0.7 12/24/2014 0755   BILITOT 0.51 12/18/2014 0941       ASSESSMENT & PLAN:  Cancer of base of tongue He tolerated cycle one poorly due to nausea. I plan to reduce a dose of chemotherapy in the next cycle and to prescribe stronger anti-emetics. In the meantime, continue supportive care   Nausea without vomiting This has subsided since last week. He will continue anti-emetics as needed. I will reduce the dose chemotherapy in the future and to prescribe additional anti-medics prior to cycle 2 of treatment   Weight loss He has progressive weight loss due to recent nausea and also for appetite. I recommend increase oral diet as tolerated or to start using his feeding tube. I will alert the dietitian to follow closely with the patient.    No orders of the defined types were placed in this encounter.   All questions were answered. The patient knows to call the clinic with any problems, questions or  concerns. No barriers to learning was detected. I spent 15 minutes counseling the patient face to face. The total time spent in the appointment was 20 minutes and more than 50% was on counseling and review of test results     [facility], [place], MD 01/07/2015 3:13 PM

## 2015-01-07 NOTE — Assessment & Plan Note (Signed)
This has subsided since last week. He will continue anti-emetics as needed. I will reduce the dose chemotherapy in the future and to prescribe additional anti-medics prior to cycle 2 of treatment

## 2015-01-07 NOTE — Assessment & Plan Note (Signed)
He has progressive weight loss due to recent nausea and also for appetite. I recommend increase oral diet as tolerated or to start using his feeding tube. I will alert the dietitian to follow closely with the patient.

## 2015-01-07 NOTE — Assessment & Plan Note (Signed)
He tolerated cycle one poorly due to nausea. I plan to reduce a dose of chemotherapy in the next cycle and to prescribe stronger anti-emetics. In the meantime, continue supportive care

## 2015-01-08 ENCOUNTER — Ambulatory Visit
Admission: RE | Admit: 2015-01-08 | Discharge: 2015-01-08 | Disposition: A | Discharge status: Home / Self Care | Payer: Medicare Other | Source: Ambulatory Visit | Attending: Radiation Oncology | Admitting: Radiation Oncology

## 2015-01-08 ENCOUNTER — Ambulatory Visit: Payer: Medicare Other

## 2015-01-08 DIAGNOSIS — Z510 Encounter for antineoplastic radiation therapy: Secondary | ICD-10-CM | POA: Diagnosis not present

## 2015-01-09 ENCOUNTER — Ambulatory Visit
Admission: RE | Admit: 2015-01-09 | Discharge: 2015-01-09 | Disposition: A | Discharge status: Home / Self Care | Payer: Medicare Other | Source: Ambulatory Visit | Attending: Radiation Oncology | Admitting: Radiation Oncology

## 2015-01-09 DIAGNOSIS — Z510 Encounter for antineoplastic radiation therapy: Secondary | ICD-10-CM | POA: Diagnosis not present

## 2015-01-10 ENCOUNTER — Ambulatory Visit
Admission: RE | Admit: 2015-01-10 | Discharge: 2015-01-10 | Disposition: A | Discharge status: Home / Self Care | Payer: Medicare Other | Source: Ambulatory Visit | Attending: Radiation Oncology | Admitting: Radiation Oncology

## 2015-01-10 DIAGNOSIS — Z510 Encounter for antineoplastic radiation therapy: Secondary | ICD-10-CM | POA: Diagnosis not present

## 2015-01-13 ENCOUNTER — Ambulatory Visit
Admission: RE | Admit: 2015-01-13 | Discharge: 2015-01-13 | Disposition: A | Discharge status: Home / Self Care | Payer: Medicare Other | Source: Ambulatory Visit | Attending: Radiation Oncology | Admitting: Radiation Oncology

## 2015-01-13 ENCOUNTER — Ambulatory Visit (HOSPITAL_COMMUNITY): Payer: Self-pay | Admitting: Dentistry

## 2015-01-13 VITALS — BP 143/86 | HR 89 | Temp 98.7°F | Resp 20 | Wt 177.1 lb

## 2015-01-13 DIAGNOSIS — C01 Malignant neoplasm of base of tongue: Secondary | ICD-10-CM

## 2015-01-13 DIAGNOSIS — Z510 Encounter for antineoplastic radiation therapy: Secondary | ICD-10-CM | POA: Diagnosis not present

## 2015-01-13 MED ORDER — LIDOCAINE VISCOUS 2 % MT SOLN: OROMUCOSAL | Status: DC

## 2015-01-13 NOTE — Progress Notes (Signed)
Patient denies pain, difficulty eating or swallowing, fatigue. He states his appetite is "okay". He is applying lotion to neck treatment area; no skin changes at this time. He is flushing peg tube.  BP 143/86 mmHg  Pulse 89  Temp(Src) 98.7 F (37.1 C) (Oral)  Resp 20  Wt 177 lb 1.6 oz (80.332 kg)

## 2015-01-13 NOTE — Progress Notes (Signed)
   Weekly Management Note:  Outpatient    ICD-9-CM ICD-10-CM   1. Cancer of base of tongue 141.0 C01 lidocaine (XYLOCAINE) 2 % solution    Current Dose:  18 Gy  Projected Dose: 70 Gy   Narrative:  The patient presents for routine under treatment assessment.  CBCT/MVCT images/Port film x-rays were reviewed.  The chart was checked. Patient denies pain, difficulty eating or swallowing, fatigue. He states his appetite is "okay". He is applying lotion to neck treatment area; no skin changes at this time. He is flushing peg tube.  BP 143/86 mmHg  Pulse 89  Temp(Src) 98.7 F (37.1 C) (Oral)  Resp 20  Wt 177 lb 1.6 oz (80.332 kg) Physical Findings:  Wt Readings from Last 3 Encounters:  01/13/15 177 lb 1.6 oz (80.332 kg)  01/07/15 175 lb 14.4 oz (79.788 kg)  01/06/15 175 lb 4.8 oz (79.516 kg)    weight is 177 lb 1.6 oz (80.332 kg). His oral temperature is 98.7 F (37.1 C). His blood pressure is 143/86 and his pulse is 89. His respiration is 20.  NAD, no skin changes   CBC    Component Value Date/Time   WBC 7.0 12/24/2014 0755   WBC 7.8 12/18/2014 0941   RBC 5.19 12/24/2014 0755   RBC 5.26 12/18/2014 0941   HGB 15.3 12/24/2014 0755   HGB 15.1 12/18/2014 0941   HCT 46.5 12/24/2014 0755   HCT 47.2 12/18/2014 0941   PLT 189 12/24/2014 0755   PLT 199 12/18/2014 0941   MCV 89.6 12/24/2014 0755   MCV 89.7 12/18/2014 0941   MCH 29.5 12/24/2014 0755   MCH 28.7 12/18/2014 0941   MCHC 32.9 12/24/2014 0755   MCHC 32.0 12/18/2014 0941   RDW 13.8 12/24/2014 0755   RDW 14.7* 12/18/2014 0941   LYMPHSABS 1.7 12/24/2014 0755   LYMPHSABS 1.2 12/18/2014 0941   MONOABS 0.7 12/24/2014 0755   MONOABS 0.4 12/18/2014 0941   EOSABS 0.1 12/24/2014 0755   EOSABS 0.1 12/18/2014 0941   BASOSABS 0.1 12/24/2014 0755   BASOSABS 0.1 12/18/2014 0941     CMP     Component Value Date/Time   NA 141 12/24/2014 0755   NA 144 12/18/2014 0941   K 3.8 12/24/2014 0755   K 4.1 12/18/2014 0941   CL 109  12/24/2014 0755   CO2 27 12/24/2014 0755   CO2 26 12/18/2014 0941   GLUCOSE 118* 12/24/2014 0755   GLUCOSE 123 12/18/2014 0941   BUN 15 12/24/2014 0755   BUN 15.7 12/18/2014 0941   CREATININE 0.99 12/24/2014 0755   CREATININE 1.1 12/18/2014 0941   CALCIUM 8.9 12/24/2014 0755   CALCIUM 9.2 12/18/2014 0941   PROT 6.9 12/24/2014 0755   PROT 6.4 12/18/2014 0941   ALBUMIN 4.0 12/24/2014 0755   ALBUMIN 3.6 12/18/2014 0941   AST 21 12/24/2014 0755   AST 19 12/18/2014 0941   ALT 25 12/24/2014 0755   ALT 23 12/18/2014 0941   ALKPHOS 63 12/24/2014 0755   ALKPHOS 60 12/18/2014 0941   BILITOT 0.7 12/24/2014 0755   BILITOT 0.51 12/18/2014 0941   GFRNONAA 80* 12/24/2014 0755   GFRAA >90 12/24/2014 0755     Impression:  The patient is tolerating radiotherapy.   Plan:  Continue radiotherapy as planned. Lidocaine Rx for future mucositis  -----------------------------------  Eppie Gibson, MD

## 2015-01-14 ENCOUNTER — Encounter (HOSPITAL_COMMUNITY): Payer: Self-pay | Admitting: Dentistry

## 2015-01-14 ENCOUNTER — Ambulatory Visit (HOSPITAL_COMMUNITY): Payer: Medicaid - Dental | Admitting: Dentistry

## 2015-01-14 ENCOUNTER — Telehealth: Payer: Self-pay | Admitting: Hematology and Oncology

## 2015-01-14 ENCOUNTER — Ambulatory Visit
Admission: RE | Admit: 2015-01-14 | Discharge: 2015-01-14 | Disposition: A | Discharge status: Home / Self Care | Payer: Medicare Other | Source: Ambulatory Visit | Attending: Radiation Oncology | Admitting: Radiation Oncology

## 2015-01-14 ENCOUNTER — Ambulatory Visit (HOSPITAL_BASED_OUTPATIENT_CLINIC_OR_DEPARTMENT_OTHER): Payer: Medicare Other | Admitting: Hematology and Oncology

## 2015-01-14 ENCOUNTER — Ambulatory Visit (HOSPITAL_COMMUNITY): Payer: Self-pay | Admitting: Dentistry

## 2015-01-14 VITALS — BP 128/73 | HR 78 | Temp 98.1°F | Wt 177.0 lb

## 2015-01-14 VITALS — BP 123/79 | HR 79 | Temp 98.4°F | Resp 18 | Ht 69.0 in | Wt 176.7 lb

## 2015-01-14 DIAGNOSIS — R1310 Dysphagia, unspecified: Secondary | ICD-10-CM

## 2015-01-14 DIAGNOSIS — C01 Malignant neoplasm of base of tongue: Secondary | ICD-10-CM

## 2015-01-14 DIAGNOSIS — K1233 Oral mucositis (ulcerative) due to radiation: Secondary | ICD-10-CM

## 2015-01-14 DIAGNOSIS — K1231 Oral mucositis (ulcerative) due to antineoplastic therapy: Secondary | ICD-10-CM

## 2015-01-14 DIAGNOSIS — R110 Nausea: Secondary | ICD-10-CM

## 2015-01-14 DIAGNOSIS — Z510 Encounter for antineoplastic radiation therapy: Secondary | ICD-10-CM | POA: Diagnosis not present

## 2015-01-14 DIAGNOSIS — R432 Parageusia: Secondary | ICD-10-CM

## 2015-01-14 DIAGNOSIS — K117 Disturbances of salivary secretion: Secondary | ICD-10-CM

## 2015-01-14 DIAGNOSIS — R634 Abnormal weight loss: Secondary | ICD-10-CM

## 2015-01-14 DIAGNOSIS — R682 Dry mouth, unspecified: Secondary | ICD-10-CM

## 2015-01-14 MED ORDER — MORPHINE SULFATE (CONCENTRATE) 20 MG/ML PO SOLN: 10.0000 mg | ORAL | Status: DC | PRN

## 2015-01-14 NOTE — Progress Notes (Signed)
01/14/2015  Patient Name:   Nicholas Wilkerson Date of Birth:   22-Nov-1940 Medical Record Number: [id number]  BP 128/73 mmHg  Pulse 78  Temp(Src) 98.1 F (36.7 C) (Oral)  Wt 177 lb (80.287 kg)  [name] presents for oral examination during radiation therapy. Patient has completed 9/35 radiation treatments. Patient has had one cycle of chemotherapy.  REVIEW OF CHIEF COMPLAINTS:  DRY MOUTH: Yes HARD TO SWALLOW: Yes  HURT TO SWALLOW: Yes TASTE CHANGES: Minimal taste remains. SORES IN MOUTH: Yes. TRISMUS: No problems with trismus. WEIGHT: 177 pounds. 8 lb. weight loss.  HOME OH REGIMEN:  BRUSHING: 3 times a day FLOSSING: Once a day  RINSING: salt water and baking soda rinses and Biotene rinses FLUORIDE: Using fluoride trays at bedtime. TRISMUS EXERCISES:  Maximum interincisal opening: 35 mm   DENTAL EXAM:  Oral Hygiene:(PLAQUE): Good oral hygiene LOCATION OF MUCOSITIS: Right buccal mucosa. DESCRIPTION OF SALIVA: Decreased saliva. Moderate xerostomia. ANY EXPOSED BONE: None noted OTHER WATCHED AREAS: Teeth in the primary field of radiation therapy. DX: Xerostomia, Dysgeusia, Dysphagia, Odynophagia, Weight Loss and Mucositis  RECOMMENDATIONS: 1. Brush after meals and at bedtime.  Use fluoride at bedtime. 2. Use trismus exercises as directed. 3. Use Biotene Rinse or salt water/baking soda rinses. 4. Multiple sips of water as needed. 5. Return to clinic in two months for oral exam after radiation therapy. Call if problems before then.  Lenn Cal, DDS

## 2015-01-14 NOTE — Telephone Encounter (Signed)
Added lab/NG for 4/12. Gave patient avs report and appointments for April.

## 2015-01-14 NOTE — Patient Instructions (Signed)
RECOMMENDATIONS: 1. Brush after meals and at bedtime.  Use fluoride at bedtime. 2. Use trismus exercises as directed. 3. Use Biotene Rinse or salt water/baking soda rinses. 4. Multiple sips of water as needed. 5. Return to clinic in two months for oral exam after radiation therapy. Call if problems before then.  Ronald F. Kulinski, DDS   

## 2015-01-15 ENCOUNTER — Ambulatory Visit
Admission: RE | Admit: 2015-01-15 | Discharge: 2015-01-15 | Disposition: A | Discharge status: Home / Self Care | Payer: Medicare Other | Source: Ambulatory Visit | Attending: Radiation Oncology | Admitting: Radiation Oncology

## 2015-01-15 DIAGNOSIS — Z510 Encounter for antineoplastic radiation therapy: Secondary | ICD-10-CM | POA: Diagnosis not present

## 2015-01-15 NOTE — Assessment & Plan Note (Signed)
He has progressive weight loss due to recent nausea and also for appetite. I recommend increase oral diet as tolerated or to start using his feeding tube. I will alert the dietitian to follow closely with the patient. For the past week, his oral intake has improved since his nausea was under controlled.

## 2015-01-15 NOTE — Assessment & Plan Note (Signed)
He tolerated cycle one poorly due to nausea. I plan to reduce a dose of chemotherapy in the next cycle and to prescribe stronger anti-emetics. In the meantime, continue supportive care He is starting to experience multiple side effects from treatment and I will monitor for them carefully.

## 2015-01-15 NOTE — Assessment & Plan Note (Signed)
This has improved significantly since last week. In the future, I might consider adding dexamethasone for 2 days after chemotherapy and to add round-the-clock antiemetic support to prevent nausea.

## 2015-01-15 NOTE — Assessment & Plan Note (Signed)
He has started to experience side effects from treatment. He was prescribed viscous lidocaine. I recommend also liquid morphine for pain as needed.

## 2015-01-15 NOTE — Progress Notes (Signed)
[place] OFFICE PROGRESS NOTE  Patient Care Team: Laurey Morale, MD as PCP - General (Family Medicine) Heath Lark, MD as Consulting Physician (Hematology and Oncology)  SUMMARY OF ONCOLOGIC HISTORY: Oncology History   Cancer of base of tongue   Staging form: Lip and Oral Cavity, AJCC 7th Edition     Clinical stage from 12/03/2014: Stage IVA (T2, N2a, M0) - Signed by Heath Lark, MD on 12/13/2014 HPV negative           Cancer of base of tongue   11/20/2014 Pathology Results Accession: [id number] confirm squamous cell carcinoma.   11/20/2014 Procedure The patient underwent fine-needle aspirate of the right lymph node   12/02/2014 Imaging CT scan showed 2.2 cm the right tongue base mass with necrotic 4 cm right level II nodal metastasis with multiple bilateral small lymphadenopathy   12/10/2014 Imaging PET CT scan showed tongue base mass with right level II lymph node metastasis   12/24/2014 Procedure He has placement of port and feeding tube   01/01/2015 -  Chemotherapy He received high dose cisplatin   01/01/2015 -  Radiation Therapy He received concurrent radiation treatment    INTERVAL HISTORY: Please see below for problem oriented charting. He is seen today as part of his weekly supportive care visit. He started experience more mucositis. He was prescribed Viscous Lidocaine but has not started using it. His weight loss has stabilized. Nausea has improved.  REVIEW OF SYSTEMS:   Constitutional: Denies fevers, chills or abnormal weight loss Eyes: Denies blurriness of vision Respiratory: Denies cough, dyspnea or wheezes Cardiovascular: Denies palpitation, chest discomfort or lower extremity swelling Skin: Denies abnormal skin rashes Lymphatics: Denies new lymphadenopathy or easy bruising Neurological:Denies numbness, tingling or new weaknesses Behavioral/Psych: Mood is stable, no new changes  All other systems were reviewed with the patient and are negative.  I have  reviewed the past medical history, past surgical history, social history and family history with the patient and they are unchanged from previous note.  ALLERGIES:  has No Known Allergies.  MEDICATIONS:  Current Outpatient Prescriptions  Medication Sig Dispense Refill  . ASPIRIN PO Take 81 mg by mouth every morning.     . carvedilol (COREG) 25 MG tablet Take 1 tablet (25 mg total) by mouth 2 (two) times daily with a meal. 180 tablet 3  . hyaluronate sodium (RADIAPLEXRX) GEL Apply 1 application topically 2 (two) times daily.    Marland Kitchen lidocaine (XYLOCAINE) 2 % solution Patient: Mix 1part 2% viscous lidocaine, 1part H20. Swish and/or swallow 64mL of this mixture, 48min before meals and at bedtime, up to QID 100 mL 5  . lidocaine-prilocaine (EMLA) cream Apply to affected area once 30 g 3  . LORazepam (ATIVAN) 0.5 MG tablet Take 1-2 tablets PRN claustrophobia; take 20 minutes before wearing radiotherapy mask. 36 tablet 0  . morphine (ROXANOL) 20 MG/ML concentrated solution Take 0.5 mLs (10 mg total) by mouth every 2 (two) hours as needed for severe pain. 120 mL 0  . multivitamin (THERAGRAN) per tablet Take 1 tablet by mouth every morning.     . ondansetron (ZOFRAN) 8 MG tablet Take 1 tablet (8 mg total) by mouth every 8 (eight) hours as needed. 30 tablet 1  . prochlorperazine (COMPAZINE) 10 MG tablet Take 1 tablet (10 mg total) by mouth every 6 (six) hours as needed (Nausea or vomiting). 30 tablet 1  . simvastatin (ZOCOR) 40 MG tablet Take 1 tablet (40 mg total) by mouth at bedtime. [place]  tablet 3  . sodium fluoride (FLUORISHIELD) 1.1 % GEL dental gel Instill one drop of gel per tooth space of fluoride tray. Place over teeth for 5 minutes. Remove. Spit out excess. Repeat nightly. 120 mL prn  . tadalafil (CIALIS) 20 MG tablet Take 1 tablet (20 mg total) by mouth daily as needed for erectile dysfunction. 30 tablet 3  . tamsulosin (FLOMAX) 0.4 MG CAPS capsule Take 1 capsule (0.4 mg total) by mouth daily. 30  capsule 5  . TRAVATAN Z 0.004 % SOLN ophthalmic solution Place 1 drop into both eyes at bedtime.      No current facility-administered medications for this visit.    PHYSICAL EXAMINATION: ECOG PERFORMANCE STATUS: 1 - Symptomatic but completely ambulatory  Filed Vitals:   01/14/15 1421  BP: 123/79  Pulse: 79  Temp: 98.4 F (36.9 C)  Resp: 18   Filed Weights   01/14/15 1421  Weight: 176 lb 11.2 oz (80.151 kg)    GENERAL:alert, no distress and comfortable SKIN: skin color, texture, turgor are normal, no rashes or significant lesions EYES: normal, Conjunctiva are pink and non-injected, sclera clear OROPHARYNX: No mild mucositis. No thrush. NECK: supple, thyroid normal size, non-tender, without nodularity LYMPH:  Persistent palpable lymphadenopathy in his neck, unchanged.  LUNGS: clear to auscultation and percussion with normal breathing effort HEART: regular rate & rhythm and no murmurs and no lower extremity edema ABDOMEN:abdomen soft, non-tender and normal bowel sounds Musculoskeletal:no cyanosis of digits and no clubbing  NEURO: alert & oriented x 3 with fluent speech, no focal motor/sensory deficits  LABORATORY DATA:  I have reviewed the data as listed    Component Value Date/Time   NA 141 12/24/2014 0755   NA 144 12/18/2014 0941   K 3.8 12/24/2014 0755   K 4.1 12/18/2014 0941   CL 109 12/24/2014 0755   CO2 27 12/24/2014 0755   CO2 26 12/18/2014 0941   GLUCOSE 118* 12/24/2014 0755   GLUCOSE 123 12/18/2014 0941   BUN 15 12/24/2014 0755   BUN 15.7 12/18/2014 0941   CREATININE 0.99 12/24/2014 0755   CREATININE 1.1 12/18/2014 0941   CALCIUM 8.9 12/24/2014 0755   CALCIUM 9.2 12/18/2014 0941   PROT 6.9 12/24/2014 0755   PROT 6.4 12/18/2014 0941   ALBUMIN 4.0 12/24/2014 0755   ALBUMIN 3.6 12/18/2014 0941   AST 21 12/24/2014 0755   AST 19 12/18/2014 0941   ALT 25 12/24/2014 0755   ALT 23 12/18/2014 0941   ALKPHOS 63 12/24/2014 0755   ALKPHOS 60 12/18/2014 0941    BILITOT 0.7 12/24/2014 0755   BILITOT 0.51 12/18/2014 0941   GFRNONAA 80* 12/24/2014 0755   GFRAA >90 12/24/2014 0755    No results found for: SPEP, UPEP  Lab Results  Component Value Date   WBC 7.0 12/24/2014   NEUTROABS 4.4 12/24/2014   HGB 15.3 12/24/2014   HCT 46.5 12/24/2014   MCV 89.6 12/24/2014   PLT 189 12/24/2014      Chemistry      Component Value Date/Time   NA 141 12/24/2014 0755   NA 144 12/18/2014 0941   K 3.8 12/24/2014 0755   K 4.1 12/18/2014 0941   CL 109 12/24/2014 0755   CO2 27 12/24/2014 0755   CO2 26 12/18/2014 0941   BUN 15 12/24/2014 0755   BUN 15.7 12/18/2014 0941   CREATININE 0.99 12/24/2014 0755   CREATININE 1.1 12/18/2014 0941      Component Value Date/Time   CALCIUM 8.9 12/24/2014 0755  CALCIUM 9.2 12/18/2014 0941   ALKPHOS 63 12/24/2014 0755   ALKPHOS 60 12/18/2014 0941   AST 21 12/24/2014 0755   AST 19 12/18/2014 0941   ALT 25 12/24/2014 0755   ALT 23 12/18/2014 0941   BILITOT 0.7 12/24/2014 0755   BILITOT 0.51 12/18/2014 0941       ASSESSMENT & PLAN:  Cancer of base of tongue He tolerated cycle one poorly due to nausea. I plan to reduce a dose of chemotherapy in the next cycle and to prescribe stronger anti-emetics. In the meantime, continue supportive care He is starting to experience multiple side effects from treatment and I will monitor for them carefully.     Nausea without vomiting This has improved significantly since last week. In the future, I might consider adding dexamethasone for 2 days after chemotherapy and to add round-the-clock antiemetic support to prevent nausea.   Weight loss He has progressive weight loss due to recent nausea and also for appetite. I recommend increase oral diet as tolerated or to start using his feeding tube. I will alert the dietitian to follow closely with the patient. For the past week, his oral intake has improved since his nausea was under controlled.     Mucositis due to  chemotherapy He has started to experience side effects from treatment. He was prescribed viscous lidocaine. I recommend also liquid morphine for pain as needed.    No orders of the defined types were placed in this encounter.   All questions were answered. The patient knows to call the clinic with any problems, questions or concerns. No barriers to learning was detected. I spent 25 minutes counseling the patient face to face. The total time spent in the appointment was 30 minutes and more than 50% was on counseling and review of test results     [facility], [place], MD 01/15/2015 9:44 AM

## 2015-01-16 ENCOUNTER — Ambulatory Visit
Admission: RE | Admit: 2015-01-16 | Discharge: 2015-01-16 | Disposition: A | Discharge status: Home / Self Care | Payer: Medicare Other | Source: Ambulatory Visit | Attending: Radiation Oncology | Admitting: Radiation Oncology

## 2015-01-16 DIAGNOSIS — Z510 Encounter for antineoplastic radiation therapy: Secondary | ICD-10-CM | POA: Diagnosis not present

## 2015-01-17 ENCOUNTER — Ambulatory Visit
Admission: RE | Admit: 2015-01-17 | Discharge: 2015-01-17 | Disposition: A | Discharge status: Home / Self Care | Payer: Medicare Other | Source: Ambulatory Visit | Attending: Radiation Oncology | Admitting: Radiation Oncology

## 2015-01-17 DIAGNOSIS — Z510 Encounter for antineoplastic radiation therapy: Secondary | ICD-10-CM | POA: Diagnosis not present

## 2015-01-20 ENCOUNTER — Ambulatory Visit: Payer: Self-pay

## 2015-01-20 ENCOUNTER — Telehealth: Payer: Self-pay

## 2015-01-20 ENCOUNTER — Ambulatory Visit
Admission: RE | Admit: 2015-01-20 | Discharge: 2015-01-20 | Disposition: A | Discharge status: Home / Self Care | Payer: Medicare Other | Source: Ambulatory Visit | Attending: Radiation Oncology | Admitting: Radiation Oncology

## 2015-01-20 ENCOUNTER — Encounter: Payer: Self-pay | Admitting: Radiation Oncology

## 2015-01-20 ENCOUNTER — Ambulatory Visit: Payer: Medicare Other

## 2015-01-20 VITALS — BP 113/70 | HR 91 | Temp 98.4°F | Resp 16 | Wt 169.6 lb

## 2015-01-20 DIAGNOSIS — Z510 Encounter for antineoplastic radiation therapy: Secondary | ICD-10-CM | POA: Diagnosis not present

## 2015-01-20 DIAGNOSIS — C01 Malignant neoplasm of base of tongue: Secondary | ICD-10-CM

## 2015-01-20 MED ORDER — SUCRALFATE 1 G PO TABS: ORAL_TABLET | ORAL | Status: DC

## 2015-01-20 NOTE — Progress Notes (Signed)
Weekly Management Note:  Outpatient    ICD-9-CM ICD-10-CM   1. Cancer of base of tongue 141.0 C01 sucralfate (CARAFATE) 1 G tablet    Current Dose:  18 Gy  Projected Dose: 70 Gy   Narrative:  The patient presents for routine under treatment assessment.  CBCT/MVCT images/Port film x-rays were reviewed.  The chart was checked.  Complaints include 1) pain in throat 2) poor po intake r/t pain 3) not using PEG because hasn't gotten shakes from [facility], yet 4) nausea     BP 113/70 mmHg  Pulse 91  Temp(Src) 98.4 F (36.9 C) (Oral)  Resp 16  Wt 169 lb 9.6 oz (76.93 kg)  SpO2 100% Physical Findings:  Wt Readings from Last 3 Encounters:  01/20/15 169 lb 9.6 oz (76.93 kg)  01/14/15 176 lb 11.2 oz (80.151 kg)  01/14/15 177 lb (80.287 kg)    weight is 169 lb 9.6 oz (76.93 kg). His oral temperature is 98.4 F (36.9 C). His blood pressure is 113/70 and his pulse is 91. His respiration is 16 and oxygen saturation is 100%.  NAD, no skin changes  palpable right neck mass, patchy mucositis, no thrush  CBC    Component Value Date/Time   WBC 7.0 12/24/2014 0755   WBC 7.8 12/18/2014 0941   RBC 5.19 12/24/2014 0755   RBC 5.26 12/18/2014 0941   HGB 15.3 12/24/2014 0755   HGB 15.1 12/18/2014 0941   HCT 46.5 12/24/2014 0755   HCT 47.2 12/18/2014 0941   PLT 189 12/24/2014 0755   PLT 199 12/18/2014 0941   MCV 89.6 12/24/2014 0755   MCV 89.7 12/18/2014 0941   MCH 29.5 12/24/2014 0755   MCH 28.7 12/18/2014 0941   MCHC 32.9 12/24/2014 0755   MCHC 32.0 12/18/2014 0941   RDW 13.8 12/24/2014 0755   RDW 14.7* 12/18/2014 0941   LYMPHSABS 1.7 12/24/2014 0755   LYMPHSABS 1.2 12/18/2014 0941   MONOABS 0.7 12/24/2014 0755   MONOABS 0.4 12/18/2014 0941   EOSABS 0.1 12/24/2014 0755   EOSABS 0.1 12/18/2014 0941   BASOSABS 0.1 12/24/2014 0755   BASOSABS 0.1 12/18/2014 0941     CMP     Component Value Date/Time   NA 141 12/24/2014 0755   NA 144 12/18/2014 0941   K 3.8 12/24/2014 0755     K 4.1 12/18/2014 0941   CL 109 12/24/2014 0755   CO2 27 12/24/2014 0755   CO2 26 12/18/2014 0941   GLUCOSE 118* 12/24/2014 0755   GLUCOSE 123 12/18/2014 0941   BUN 15 12/24/2014 0755   BUN 15.7 12/18/2014 0941   CREATININE 0.99 12/24/2014 0755   CREATININE 1.1 12/18/2014 0941   CALCIUM 8.9 12/24/2014 0755   CALCIUM 9.2 12/18/2014 0941   PROT 6.9 12/24/2014 0755   PROT 6.4 12/18/2014 0941   ALBUMIN 4.0 12/24/2014 0755   ALBUMIN 3.6 12/18/2014 0941   AST 21 12/24/2014 0755   AST 19 12/18/2014 0941   ALT 25 12/24/2014 0755   ALT 23 12/18/2014 0941   ALKPHOS 63 12/24/2014 0755   ALKPHOS 60 12/18/2014 0941   BILITOT 0.7 12/24/2014 0755   BILITOT 0.51 12/18/2014 0941   GFRNONAA 80* 12/24/2014 0755   GFRAA >90 12/24/2014 0755     Impression:  The patient is tolerating radiotherapy.   Plan:  Continue radiotherapy as planned.  Advised on starting morphine,  starting tube feeds with ensure or boost,  sucralfate Rx for sore throat,  Miralax to prevent constipation on  narcotics,  ativan + Zofran for nausea   Assessment tomorrow in med/onc with labs. May need IV fluids.  Neff on Wed.  Encouragement given. -----------------------------------  Eppie Gibson, MD

## 2015-01-20 NOTE — Progress Notes (Signed)
Received patient in the clinic today for PUT with Dr. Isidore Moos. Patient's biggest complaint is pain with swallowing. Patient has lost 8 lb since 4/4. Patient reports on average he takes in one can of BOOST per day. Patient slightly orthostatic. Reports using Lidocaine to relieve throat pain but, this creates nausea. Hyperpigmentation of anterior neck noted. Reports using radiaplex bid as directed. Scheduled for CBC, CMP and Magnesium tomorrow followed by a follow up with Dr. Alvy Bimler. Reports thick rope like saliva. Reports fatigue. Reports cough associated with clear throat. Reports zofran works better than compazine for persistent nausea. Denies dizziness with standing.

## 2015-01-20 NOTE — Telephone Encounter (Signed)
Pt called to cancel his appointment today. He was rescheduled for 02-10-15.

## 2015-01-21 ENCOUNTER — Ambulatory Visit: Payer: Medicare Other | Admitting: Nutrition

## 2015-01-21 ENCOUNTER — Other Ambulatory Visit (HOSPITAL_BASED_OUTPATIENT_CLINIC_OR_DEPARTMENT_OTHER): Payer: Medicare Other

## 2015-01-21 ENCOUNTER — Ambulatory Visit (HOSPITAL_BASED_OUTPATIENT_CLINIC_OR_DEPARTMENT_OTHER): Payer: Medicare Other | Admitting: Hematology and Oncology

## 2015-01-21 ENCOUNTER — Telehealth: Payer: Self-pay | Admitting: Hematology and Oncology

## 2015-01-21 ENCOUNTER — Encounter: Payer: Self-pay | Admitting: *Deleted

## 2015-01-21 ENCOUNTER — Encounter: Payer: Self-pay | Admitting: Hematology and Oncology

## 2015-01-21 ENCOUNTER — Telehealth: Payer: Self-pay | Admitting: Nutrition

## 2015-01-21 ENCOUNTER — Ambulatory Visit
Admission: RE | Admit: 2015-01-21 | Discharge: 2015-01-21 | Disposition: A | Discharge status: Home / Self Care | Payer: Medicare Other | Source: Ambulatory Visit | Attending: Radiation Oncology | Admitting: Radiation Oncology

## 2015-01-21 ENCOUNTER — Ambulatory Visit (HOSPITAL_BASED_OUTPATIENT_CLINIC_OR_DEPARTMENT_OTHER): Payer: Medicare Other

## 2015-01-21 VITALS — BP 112/69 | HR 94 | Temp 98.3°F | Resp 17 | Ht 69.0 in | Wt 168.8 lb

## 2015-01-21 DIAGNOSIS — R634 Abnormal weight loss: Secondary | ICD-10-CM | POA: Diagnosis not present

## 2015-01-21 DIAGNOSIS — C01 Malignant neoplasm of base of tongue: Secondary | ICD-10-CM

## 2015-01-21 DIAGNOSIS — R110 Nausea: Secondary | ICD-10-CM

## 2015-01-21 DIAGNOSIS — T451X5A Adverse effect of antineoplastic and immunosuppressive drugs, initial encounter: Secondary | ICD-10-CM

## 2015-01-21 DIAGNOSIS — D701 Agranulocytosis secondary to cancer chemotherapy: Secondary | ICD-10-CM | POA: Diagnosis not present

## 2015-01-21 DIAGNOSIS — K1231 Oral mucositis (ulcerative) due to antineoplastic therapy: Secondary | ICD-10-CM | POA: Diagnosis not present

## 2015-01-21 DIAGNOSIS — Z510 Encounter for antineoplastic radiation therapy: Secondary | ICD-10-CM | POA: Diagnosis not present

## 2015-01-21 LAB — CBC WITH DIFFERENTIAL/PLATELET
BASO%: 0.4 % (ref 0.0–2.0)
BASOS ABS: 0 10*3/uL (ref 0.0–0.1)
EOS ABS: 0 10*3/uL (ref 0.0–0.5)
EOS%: 1.6 % (ref 0.0–7.0)
HEMATOCRIT: 46.6 % (ref 38.4–49.9)
HEMOGLOBIN: 15.4 g/dL (ref 13.0–17.1)
LYMPH#: 0.5 10*3/uL — AB (ref 0.9–3.3)
LYMPH%: 19.8 % (ref 14.0–49.0)
MCH: 29.4 pg (ref 27.2–33.4)
MCHC: 33 g/dL (ref 32.0–36.0)
MCV: 89.1 fL (ref 79.3–98.0)
MONO#: 0.7 10*3/uL (ref 0.1–0.9)
MONO%: 26.7 % — ABNORMAL HIGH (ref 0.0–14.0)
NEUT%: 51.5 % (ref 39.0–75.0)
NEUTROS ABS: 1.3 10*3/uL — AB (ref 1.5–6.5)
PLATELETS: 219 10*3/uL (ref 140–400)
RBC: 5.23 10*6/uL (ref 4.20–5.82)
RDW: 12.8 % (ref 11.0–14.6)
WBC: 2.6 10*3/uL — ABNORMAL LOW (ref 4.0–10.3)

## 2015-01-21 LAB — MAGNESIUM (CC13): MAGNESIUM: 2.4 mg/dL (ref 1.5–2.5)

## 2015-01-21 LAB — COMPREHENSIVE METABOLIC PANEL (CC13)
ALT: 14 U/L (ref 0–55)
ANION GAP: 9 meq/L (ref 3–11)
AST: 12 U/L (ref 5–34)
Albumin: 3.2 g/dL — ABNORMAL LOW (ref 3.5–5.0)
Alkaline Phosphatase: 86 U/L (ref 40–150)
BUN: 17.3 mg/dL (ref 7.0–26.0)
CALCIUM: 9.5 mg/dL (ref 8.4–10.4)
CHLORIDE: 101 meq/L (ref 98–109)
CO2: 29 mEq/L (ref 22–29)
Creatinine: 1.1 mg/dL (ref 0.7–1.3)
EGFR: 71 mL/min/{1.73_m2} — AB (ref >90)
GLUCOSE: 134 mg/dL (ref 70–140)
Potassium: 3.8 mEq/L (ref 3.5–5.1)
SODIUM: 139 meq/L (ref 136–145)
TOTAL PROTEIN: 7.1 g/dL (ref 6.4–8.3)
Total Bilirubin: 0.29 mg/dL (ref 0.20–1.20)

## 2015-01-21 MED ORDER — SODIUM CHLORIDE 0.9 % IV SOLN
1000.0000 mL | Freq: Once | INTRAVENOUS | Status: AC
  Administered 2015-01-21: 15:00:00 via INTRAVENOUS

## 2015-01-21 MED ORDER — SODIUM CHLORIDE 0.9 % IJ SOLN
10.0000 mL | INTRAMUSCULAR | Status: DC | PRN
  Administered 2015-01-21: 10 mL
  Filled 2015-01-21: qty 10

## 2015-01-21 MED ORDER — FENTANYL 25 MCG/HR TD PT72: 25.0000 ug | MEDICATED_PATCH | TRANSDERMAL | Status: DC

## 2015-01-21 MED ORDER — HEPARIN SOD (PORK) LOCK FLUSH 100 UNIT/ML IV SOLN
500.0000 [IU] | Freq: Once | INTRAVENOUS | Status: AC | PRN
  Administered 2015-01-21: 500 [IU]
  Filled 2015-01-21: qty 5

## 2015-01-21 NOTE — Progress Notes (Signed)
[place] OFFICE PROGRESS NOTE  Patient Care Team: Laurey Morale, MD as PCP - General (Family Medicine) Heath Lark, MD as Consulting Physician (Hematology and Oncology)  SUMMARY OF ONCOLOGIC HISTORY: Oncology History   Cancer of base of tongue   Staging form: Lip and Oral Cavity, AJCC 7th Edition     Clinical stage from 12/03/2014: Stage IVA (T2, N2a, M0) - Signed by Heath Lark, MD on 12/13/2014 HPV negative           Cancer of base of tongue   11/20/2014 Pathology Results Accession: [id number] confirm squamous cell carcinoma.   11/20/2014 Procedure The patient underwent fine-needle aspirate of the right lymph node   12/02/2014 Imaging CT scan showed 2.2 cm the right tongue base mass with necrotic 4 cm right level II nodal metastasis with multiple bilateral small lymphadenopathy   12/10/2014 Imaging PET CT scan showed tongue base mass with right level II lymph node metastasis   12/24/2014 Procedure He has placement of port and feeding tube   01/01/2015 -  Chemotherapy He received high dose cisplatin   01/01/2015 -  Radiation Therapy He received concurrent radiation treatment   01/21/2015 Adverse Reaction  cycle 2 chemotherapy dose will be reduced by 25% due to intolerable side effects    INTERVAL HISTORY: Please see below for problem oriented charting. He is seen prior to cycle 2 of treatment. He is having increasing sensation also throat in pain. The liquid pain medicine is helping. He does not like to use Viscous Lidocaine. He has mild progressive weight loss.  REVIEW OF SYSTEMS:   Constitutional: Denies fevers, chills or abnormal weight loss Eyes: Denies blurriness of vision Respiratory: Denies cough, dyspnea or wheezes Cardiovascular: Denies palpitation, chest discomfort or lower extremity swelling Gastrointestinal:  Denies nausea, heartburn or change in bowel habits Skin: Denies abnormal skin rashes Lymphatics: Denies new lymphadenopathy or easy  bruising Neurological:Denies numbness, tingling or new weaknesses Behavioral/Psych: Mood is stable, no new changes  All other systems were reviewed with the patient and are negative.  I have reviewed the past medical history, past surgical history, social history and family history with the patient and they are unchanged from previous note.  ALLERGIES:  has No Known Allergies.  MEDICATIONS:  Current Outpatient Prescriptions  Medication Sig Dispense Refill  . ASPIRIN PO Take 81 mg by mouth every morning.     . carvedilol (COREG) 25 MG tablet Take 1 tablet (25 mg total) by mouth 2 (two) times daily with a meal. 180 tablet 3  . hyaluronate sodium (RADIAPLEXRX) GEL Apply 1 application topically 2 (two) times daily.    Marland Kitchen lidocaine (XYLOCAINE) 2 % solution Patient: Mix 1part 2% viscous lidocaine, 1part H20. Swish and/or swallow 55mL of this mixture, 45min before meals and at bedtime, up to QID 100 mL 5  . lidocaine-prilocaine (EMLA) cream Apply to affected area once 30 g 3  . LORazepam (ATIVAN) 0.5 MG tablet Take 1-2 tablets PRN claustrophobia; take 20 minutes before wearing radiotherapy mask. 36 tablet 0  . morphine (ROXANOL) 20 MG/ML concentrated solution Take 0.5 mLs (10 mg total) by mouth every 2 (two) hours as needed for severe pain. 120 mL 0  . multivitamin (THERAGRAN) per tablet Take 1 tablet by mouth every morning.     . ondansetron (ZOFRAN) 8 MG tablet Take 1 tablet (8 mg total) by mouth every 8 (eight) hours as needed. 30 tablet 1  . prochlorperazine (COMPAZINE) 10 MG tablet Take 1 tablet (10 mg total)  by mouth every 6 (six) hours as needed (Nausea or vomiting). 30 tablet 1  . simvastatin (ZOCOR) 40 MG tablet Take 1 tablet (40 mg total) by mouth at bedtime. 90 tablet 3  . sodium fluoride (FLUORISHIELD) 1.1 % GEL dental gel Instill one drop of gel per tooth space of fluoride tray. Place over teeth for 5 minutes. Remove. Spit out excess. Repeat nightly. 120 mL prn  . sucralfate (CARAFATE) 1  G tablet Dissolve 1 tablet in 10 mL H20 and swallow 30 min prior to meals and bedtime. 60 tablet 5  . tamsulosin (FLOMAX) 0.4 MG CAPS capsule Take 1 capsule (0.4 mg total) by mouth daily. 30 capsule 5  . TRAVATAN Z 0.004 % SOLN ophthalmic solution Place 1 drop into both eyes at bedtime.     . fentaNYL (DURAGESIC - DOSED MCG/HR) 25 MCG/HR patch Place 1 patch (25 mcg total) onto the skin every 3 (three) days. 5 patch 0  . tadalafil (CIALIS) 20 MG tablet Take 1 tablet (20 mg total) by mouth daily as needed for erectile dysfunction. (Patient not taking: Reported on 01/21/2015) 30 tablet 3   No current facility-administered medications for this visit.   Facility-Administered Medications Ordered in Other Visits  Medication Dose Route Frequency Provider Last Rate Last Dose  . heparin lock flush 100 unit/mL  500 Units Intracatheter Once PRN Heath Lark, MD      . sodium chloride 0.9 % injection 10 mL  10 mL Intracatheter PRN Heath Lark, MD        PHYSICAL EXAMINATION: ECOG PERFORMANCE STATUS: 1 - Symptomatic but completely ambulatory  Filed Vitals:   01/21/15 1407  BP: 112/69  Pulse: 94  Temp: 98.3 F (36.8 C)  Resp: 17   Filed Weights   01/21/15 1407  Weight: 168 lb 12.8 oz (76.567 kg)    GENERAL:alert, no distress and comfortable SKIN: skin color, texture, turgor are normal, no rashes or significant lesions EYES: normal, Conjunctiva are pink and non-injected, sclera clear OROPHARYNX: mucositis and dry mucous membrane. No thrush. NECK: supple, thyroid normal size, non-tender, without nodularity LYMPH:  He has persistent palpable lymphadenopathy in the cervical region LUNGS: clear to auscultation and percussion with normal breathing effort HEART: regular rate & rhythm and no murmurs and no lower extremity edema ABDOMEN:abdomen soft, non-tender and normal bowel sounds. Feeding tube site looks okay Musculoskeletal:no cyanosis of digits and no clubbing  NEURO: alert & oriented x 3 with  fluent speech, no focal motor/sensory deficits  LABORATORY DATA:  I have reviewed the data as listed    Component Value Date/Time   NA 139 01/21/2015 1349   NA 141 12/24/2014 0755   K 3.8 01/21/2015 1349   K 3.8 12/24/2014 0755   CL 109 12/24/2014 0755   CO2 29 01/21/2015 1349   CO2 27 12/24/2014 0755   GLUCOSE 134 01/21/2015 1349   GLUCOSE 118* 12/24/2014 0755   BUN 17.3 01/21/2015 1349   BUN 15 12/24/2014 0755   CREATININE 1.1 01/21/2015 1349   CREATININE 0.99 12/24/2014 0755   CALCIUM 9.5 01/21/2015 1349   CALCIUM 8.9 12/24/2014 0755   PROT 7.1 01/21/2015 1349   PROT 6.9 12/24/2014 0755   ALBUMIN 3.2* 01/21/2015 1349   ALBUMIN 4.0 12/24/2014 0755   AST 12 01/21/2015 1349   AST 21 12/24/2014 0755   ALT 14 01/21/2015 1349   ALT 25 12/24/2014 0755   ALKPHOS 86 01/21/2015 1349   ALKPHOS 63 12/24/2014 0755   BILITOT 0.29 01/21/2015 1349  BILITOT 0.7 12/24/2014 0755   GFRNONAA 80* 12/24/2014 0755   GFRAA >90 12/24/2014 0755    No results found for: SPEP, UPEP  Lab Results  Component Value Date   WBC 2.6* 01/21/2015   NEUTROABS 1.3* 01/21/2015   HGB 15.4 01/21/2015   HCT 46.6 01/21/2015   MCV 89.1 01/21/2015   PLT 219 01/21/2015      Chemistry      Component Value Date/Time   NA 139 01/21/2015 1349   NA 141 12/24/2014 0755   K 3.8 01/21/2015 1349   K 3.8 12/24/2014 0755   CL 109 12/24/2014 0755   CO2 29 01/21/2015 1349   CO2 27 12/24/2014 0755   BUN 17.3 01/21/2015 1349   BUN 15 12/24/2014 0755   CREATININE 1.1 01/21/2015 1349   CREATININE 0.99 12/24/2014 0755      Component Value Date/Time   CALCIUM 9.5 01/21/2015 1349   CALCIUM 8.9 12/24/2014 0755   ALKPHOS 86 01/21/2015 1349   ALKPHOS 63 12/24/2014 0755   AST 12 01/21/2015 1349   AST 21 12/24/2014 0755   ALT 14 01/21/2015 1349   ALT 25 12/24/2014 0755   BILITOT 0.29 01/21/2015 1349   BILITOT 0.7 12/24/2014 0755      ASSESSMENT & PLAN:  Cancer of base of tongue He tolerated cycle one  poorly due to nausea. I plan to reduce dose of chemotherapy in the next cycle by 25% and to prescribe stronger anti-emetics  With intravenous IV dexamethasone for 2 days after treatment In the meantime, continue supportive care He is starting to experience multiple side effects from treatment and I will monitor for them carefully.  I recommend we start IV fluid support for the remainder of his radiation treatment      Leukopenia due to antineoplastic chemotherapy This is likely due to recent treatment. The patient denies recent history of fevers, cough, chills, diarrhea or dysuria. He is asymptomatic from the leukopenia. I will observe for now.  I will reduce the chemotherapy as above.   Mucositis due to chemotherapy He has started to experience side effects from treatment. He was prescribed viscous lidocaine. I recommend also liquid morphine for pain as needed.  I plan to add on fentanyl patch for better pain control    Nausea without vomiting  I recommend addition of IV fluids daily along with IV anti-emetics. I will also add IV dexamethasone for 2 days after treatment along with reduce dose cisplatin   Weight loss He has progressive weight loss due to recent nausea and also for appetite. I recommend increase oral diet as tolerated or to start using his feeding tube. I will alert the dietitian to follow closely with the patient. For the past week, his oral intake has improved since his nausea was under controlled but now his oral intake is limited by pain      No orders of the defined types were placed in this encounter.   All questions were answered. The patient knows to call the clinic with any problems, questions or concerns. No barriers to learning was detected. I spent 30 minutes counseling the patient face to face. The total time spent in the appointment was 40 minutes and more than 50% was on counseling and review of test results     [facility], [place], MD 01/21/2015 3:55  PM

## 2015-01-21 NOTE — Assessment & Plan Note (Signed)
He has started to experience side effects from treatment. He was prescribed viscous lidocaine. I recommend also liquid morphine for pain as needed.  I plan to add on fentanyl patch for better pain control

## 2015-01-21 NOTE — Assessment & Plan Note (Signed)
This is likely due to recent treatment. The patient denies recent history of fevers, cough, chills, diarrhea or dysuria. He is asymptomatic from the leukopenia. I will observe for now.  I will reduce the chemotherapy as above.

## 2015-01-21 NOTE — Telephone Encounter (Signed)
err

## 2015-01-21 NOTE — Patient Instructions (Signed)

## 2015-01-21 NOTE — Telephone Encounter (Signed)
Wife requesting assistance with TF.  Patient will come in for labs today at 2:00 and see Dr. At 2:30.  Asked them to come upstairs after lab work for nutrition follow up.  Wife appreciative.

## 2015-01-21 NOTE — Assessment & Plan Note (Signed)
He has progressive weight loss due to recent nausea and also for appetite. I recommend increase oral diet as tolerated or to start using his feeding tube. I will alert the dietitian to follow closely with the patient. For the past week, his oral intake has improved since his nausea was under controlled but now his oral intake is limited by pain

## 2015-01-21 NOTE — Assessment & Plan Note (Signed)
He tolerated cycle one poorly due to nausea. I plan to reduce dose of chemotherapy in the next cycle by 25% and to prescribe stronger anti-emetics  With intravenous IV dexamethasone for 2 days after treatment In the meantime, continue supportive care He is starting to experience multiple side effects from treatment and I will monitor for them carefully.  I recommend we start IV fluid support for the remainder of his radiation treatment

## 2015-01-21 NOTE — Assessment & Plan Note (Signed)
I recommend addition of IV fluids daily along with IV anti-emetics. I will also add IV dexamethasone for 2 days after treatment along with reduce dose cisplatin

## 2015-01-21 NOTE — Telephone Encounter (Signed)
gave and printed appt sched and avs for pt for April and May....emailed Maggie/Cindy fir ivf for 4.14/4.18/4.19/4.21

## 2015-01-21 NOTE — Progress Notes (Signed)
Patient reports decreased oral intake of foods.  He is tolerating oral nutrition supplements and consumed 3 today. Patient has agreed to begin using feeding tube for supplemental nutrition support. Weight decreased and documented as 168 pounds decreased from 185.6 pounds March 14.  Estimated Nutrition Needs:  2000 - 2200 calories, 105 - 120 grams protein, 2.2 L fluid.  Nutrition Diagnosis:  Predicted sub-optimal intake has evolved into inadequate oral intake related to tongue cancer and associated treatments as evidenced by 18 pound weight loss over 3 weeks.  Intervention:   Patient educated to continue oral nutrition supplements using Boost Plus or Ensure Plus as tolerated. He should begin bolus feedings using Osmolite 1.5 with 60 cc free water before and after bolus feedings. He should drink or flush tube with another 720 cc free water daily. Patient requires a total of 6 cans of Osmolite 1.5 OR Ensure Plus/Boost Plus daily to meet minimum estimated calorie needs. Patient educated on bolus feedings and successfully demonstrated a bolus feeding today. Written fact sheets provided. Complimentary case of Osmolite 1.5 provided. Questions answered and teach back method used.  Monitoring, Evaluation, Goals: Patient will tolerate TF and oral intake to meet 100% minimum estimated calorie needs to minimize weight loss.  Next Visit:  Wednesday, April 13, in chemotherapy.

## 2015-01-22 ENCOUNTER — Ambulatory Visit (HOSPITAL_BASED_OUTPATIENT_CLINIC_OR_DEPARTMENT_OTHER): Payer: Medicare Other

## 2015-01-22 ENCOUNTER — Ambulatory Visit: Payer: Medicare Other | Admitting: Nutrition

## 2015-01-22 ENCOUNTER — Other Ambulatory Visit: Payer: Self-pay | Admitting: *Deleted

## 2015-01-22 ENCOUNTER — Ambulatory Visit
Admission: RE | Admit: 2015-01-22 | Discharge: 2015-01-22 | Disposition: A | Discharge status: Home / Self Care | Payer: Medicare Other | Source: Ambulatory Visit | Attending: Radiation Oncology | Admitting: Radiation Oncology

## 2015-01-22 VITALS — BP 118/66 | HR 85 | Temp 98.5°F

## 2015-01-22 DIAGNOSIS — Z5111 Encounter for antineoplastic chemotherapy: Secondary | ICD-10-CM

## 2015-01-22 DIAGNOSIS — C01 Malignant neoplasm of base of tongue: Secondary | ICD-10-CM

## 2015-01-22 DIAGNOSIS — Z510 Encounter for antineoplastic radiation therapy: Secondary | ICD-10-CM | POA: Diagnosis not present

## 2015-01-22 MED ORDER — SODIUM CHLORIDE 0.9 % IV SOLN
74.5000 mg/m2 | Freq: Once | INTRAVENOUS | Status: AC
  Administered 2015-01-22: 150 mg via INTRAVENOUS
  Filled 2015-01-22: qty 150

## 2015-01-22 MED ORDER — POTASSIUM CHLORIDE 2 MEQ/ML IV SOLN
Freq: Once | INTRAVENOUS | Status: AC
  Administered 2015-01-22: 09:00:00 via INTRAVENOUS
  Filled 2015-01-22: qty 10

## 2015-01-22 MED ORDER — HEPARIN SOD (PORK) LOCK FLUSH 100 UNIT/ML IV SOLN
500.0000 [IU] | Freq: Once | INTRAVENOUS | Status: AC | PRN
  Administered 2015-01-22: 500 [IU]
  Filled 2015-01-22: qty 5

## 2015-01-22 MED ORDER — PALONOSETRON HCL INJECTION 0.25 MG/5ML
0.2500 mg | Freq: Once | INTRAVENOUS | Status: AC
  Administered 2015-01-22: 0.25 mg via INTRAVENOUS

## 2015-01-22 MED ORDER — SODIUM CHLORIDE 0.9 % IV SOLN
Freq: Once | INTRAVENOUS | Status: AC
  Administered 2015-01-22: 11:00:00 via INTRAVENOUS
  Filled 2015-01-22: qty 5

## 2015-01-22 MED ORDER — SODIUM CHLORIDE 0.9 % IJ SOLN
10.0000 mL | INTRAMUSCULAR | Status: DC | PRN
  Administered 2015-01-22: 10 mL
  Filled 2015-01-22: qty 10

## 2015-01-22 MED ORDER — PALONOSETRON HCL INJECTION 0.25 MG/5ML
INTRAVENOUS | Status: AC
  Filled 2015-01-22: qty 5

## 2015-01-22 NOTE — Progress Notes (Signed)
Gave pt copy of schedule today w/ IVF appts at [place].  Gave him a map and directions to [facility].   He verbalized understanding.

## 2015-01-22 NOTE — Patient Instructions (Signed)
[facility] Discharge Instructions for Patients Receiving Chemotherapy  Today you received the following chemotherapy agents cisplatin.    To help prevent nausea and vomiting after your treatment, we encourage you to take your nausea medication as directed.     If you develop nausea and vomiting that is not controlled by your nausea medication, call the clinic.   BELOW ARE SYMPTOMS THAT SHOULD BE REPORTED IMMEDIATELY:  *FEVER GREATER THAN 100.5 F  *CHILLS WITH OR WITHOUT FEVER  NAUSEA AND VOMITING THAT IS NOT CONTROLLED WITH YOUR NAUSEA MEDICATION  *UNUSUAL SHORTNESS OF BREATH  *UNUSUAL BRUISING OR BLEEDING  TENDERNESS IN MOUTH AND THROAT WITH OR WITHOUT PRESENCE OF ULCERS  *URINARY PROBLEMS  *BOWEL PROBLEMS  UNUSUAL RASH Items with * indicate a potential emergency and should be followed up as soon as possible.  Feel free to call the clinic you have any questions or concerns. The clinic phone number is [phone].  

## 2015-01-22 NOTE — Progress Notes (Signed)
Follow-up completed with patient in chemotherapy.  Patient reports he feels well today. He states he slept well last night.  Patient tolerated bolus tube feeding last night and this morning without difficulty    Nutrition diagnosis: Inadequate oral intake continues.  Intervention:  Patient will continue to consume boost plus or Ensure Plus by mouth as tolerated  He will supplement oral intake with Osmolite 1.5 via feeding tube. Patient understands goal of 6 cans total daily.  Teach back method used.  Monitoring, evaluation goals:  Patient will tolerate tube feeding and oral intake to meet 100% of minimum estimated calorie needs to minimize weight loss.   Next visit:  Thursday, April 21.  **Disclaimer: This note was dictated with voice recognition software. Similar sounding words can inadvertently be transcribed and this note may contain transcription errors which may not have been corrected upon publication of note.**

## 2015-01-23 ENCOUNTER — Ambulatory Visit (HOSPITAL_COMMUNITY)
Admission: RE | Admit: 2015-01-23 | Discharge: 2015-01-23 | Disposition: A | Discharge status: Home / Self Care | Payer: Medicare Other | Source: Ambulatory Visit | Attending: Hematology and Oncology | Admitting: Hematology and Oncology

## 2015-01-23 ENCOUNTER — Ambulatory Visit
Admission: RE | Admit: 2015-01-23 | Discharge: 2015-01-23 | Disposition: A | Discharge status: Home / Self Care | Payer: Medicare Other | Source: Ambulatory Visit | Attending: Radiation Oncology | Admitting: Radiation Oncology

## 2015-01-23 ENCOUNTER — Other Ambulatory Visit: Payer: Self-pay | Admitting: *Deleted

## 2015-01-23 DIAGNOSIS — Z510 Encounter for antineoplastic radiation therapy: Secondary | ICD-10-CM | POA: Diagnosis not present

## 2015-01-23 DIAGNOSIS — C01 Malignant neoplasm of base of tongue: Secondary | ICD-10-CM

## 2015-01-23 MED ORDER — HEPARIN SOD (PORK) LOCK FLUSH 100 UNIT/ML IV SOLN
500.0000 [IU] | Freq: Once | INTRAVENOUS | Status: AC
Start: 2015-01-23 — End: 2015-01-23
  Administered 2015-01-23: 500 [IU] via INTRAVENOUS
  Filled 2015-01-23: qty 5

## 2015-01-23 MED ORDER — SODIUM CHLORIDE 0.9 % IV SOLN
INTRAVENOUS | Status: DC
  Administered 2015-01-23: 13:00:00 via INTRAVENOUS

## 2015-01-23 MED ORDER — PROMETHAZINE HCL 25 MG/ML IJ SOLN
12.5000 mg | Freq: Once | INTRAMUSCULAR | Status: AC
  Administered 2015-01-23: 12.5 mg via INTRAVENOUS
  Filled 2015-01-23: qty 1

## 2015-01-23 MED ORDER — SODIUM CHLORIDE 0.9 % IV SOLN
10.0000 mg | Freq: Once | INTRAVENOUS | Status: AC
  Administered 2015-01-23: 10 mg via INTRAVENOUS
  Filled 2015-01-23: qty 1

## 2015-01-23 MED ORDER — HYDROMORPHONE HCL 4 MG/ML IJ SOLN: 1.0000 mg | INTRAMUSCULAR | Status: DC | PRN

## 2015-01-23 MED ORDER — SODIUM CHLORIDE 0.9 % IJ SOLN: 10.0000 mL | Freq: Once | INTRAMUSCULAR | Status: DC

## 2015-01-23 NOTE — Progress Notes (Signed)
To provide support and encouragement, care continuity and to assess for needs, met with patient during appt with Dr. Gorsuch.  He was accompanied by his wife. 1. He reported increasing throat soreness for which he received Rx for Fentanyl 25 mcg. 2. He verbalized understanding of the benefit of receiving IVF today, remainder of this week as well as next week. 3. I provided him a mesh brief to use for securing PEG tube and an additional role of Medipore tape. 4. He understands he can contact me with needs or concerns.  Rick Diehl, RN, BSN, CHPN Head & Neck Oncology Navigator [facility] [place] [phone]   

## 2015-01-23 NOTE — Procedures (Signed)
[facility]  Procedure Note  Nicholas Wilkerson HQI:[id number] DOB: 28-Nov-1940 DOA: 01/23/2015   PCP: Laurey Morale, MD   Associated Diagnosis: Cancer of base of tongue (141.0)  Procedure Note: IV infusion of normal saline; IV infusion of decadron   Condition During Procedure:  Pt tolerated well   Condition at Discharge:  No complications noted   Nigel Sloop, [facility]

## 2015-01-24 ENCOUNTER — Other Ambulatory Visit: Payer: Self-pay

## 2015-01-24 ENCOUNTER — Ambulatory Visit (HOSPITAL_BASED_OUTPATIENT_CLINIC_OR_DEPARTMENT_OTHER): Payer: Medicare Other

## 2015-01-24 ENCOUNTER — Telehealth: Payer: Self-pay | Admitting: Cardiology

## 2015-01-24 ENCOUNTER — Encounter: Payer: Self-pay | Admitting: *Deleted

## 2015-01-24 ENCOUNTER — Other Ambulatory Visit: Payer: Self-pay | Admitting: *Deleted

## 2015-01-24 ENCOUNTER — Encounter: Payer: Self-pay | Admitting: Nurse Practitioner

## 2015-01-24 ENCOUNTER — Ambulatory Visit (HOSPITAL_BASED_OUTPATIENT_CLINIC_OR_DEPARTMENT_OTHER): Payer: Medicare Other | Admitting: Nurse Practitioner

## 2015-01-24 ENCOUNTER — Ambulatory Visit
Admission: RE | Admit: 2015-01-24 | Discharge: 2015-01-24 | Disposition: A | Discharge status: Home / Self Care | Payer: Medicare Other | Source: Ambulatory Visit | Attending: Radiation Oncology | Admitting: Radiation Oncology

## 2015-01-24 ENCOUNTER — Encounter (HOSPITAL_COMMUNITY): Payer: Self-pay

## 2015-01-24 VITALS — BP 138/81 | HR 86 | Temp 97.8°F

## 2015-01-24 DIAGNOSIS — Z510 Encounter for antineoplastic radiation therapy: Secondary | ICD-10-CM | POA: Diagnosis not present

## 2015-01-24 DIAGNOSIS — E860 Dehydration: Secondary | ICD-10-CM

## 2015-01-24 DIAGNOSIS — R002 Palpitations: Secondary | ICD-10-CM

## 2015-01-24 DIAGNOSIS — K1231 Oral mucositis (ulcerative) due to antineoplastic therapy: Secondary | ICD-10-CM

## 2015-01-24 DIAGNOSIS — C01 Malignant neoplasm of base of tongue: Secondary | ICD-10-CM

## 2015-01-24 DIAGNOSIS — R110 Nausea: Secondary | ICD-10-CM

## 2015-01-24 MED ORDER — SODIUM CHLORIDE 0.9 % IV SOLN
Freq: Once | INTRAVENOUS | Status: AC
  Administered 2015-01-24: 14:00:00 via INTRAVENOUS

## 2015-01-24 MED ORDER — HEPARIN SOD (PORK) LOCK FLUSH 100 UNIT/ML IV SOLN
500.0000 [IU] | Freq: Once | INTRAVENOUS | Status: AC | PRN
  Administered 2015-01-24: 500 [IU]
  Filled 2015-01-24: qty 5

## 2015-01-24 MED ORDER — SODIUM CHLORIDE 0.9 % IJ SOLN
10.0000 mL | INTRAMUSCULAR | Status: DC | PRN
Start: 2015-01-24 — End: 2015-01-24
  Administered 2015-01-24: 10 mL
  Filled 2015-01-24: qty 10

## 2015-01-24 MED ORDER — SODIUM CHLORIDE 0.9 % IV SOLN
10.0000 mg | Freq: Once | INTRAVENOUS | Status: AC
  Administered 2015-01-24: 10 mg via INTRAVENOUS
  Filled 2015-01-24: qty 1

## 2015-01-24 NOTE — Assessment & Plan Note (Signed)
Patient reports that he is able to take in some oral intake by mouth.  He continues to receive most of his nutrition via his PEG tube.  He will continue with daily IV fluid rehydration for the time being.  Patient has plans to return this coming Monday, 01/27/2015 for additional IV fluid rehydration.

## 2015-01-24 NOTE — Progress Notes (Signed)
SYMPTOM MANAGEMENT CLINIC   HPI: Nicholas Wilkerson 74 y.o. male diagnosed with tongue cancer.  Currently undergoing cisplatin chemotherapy and radiation therapy.  Patient presented to the [facility] today for additional IV fluid rehydration.  Patient has a history of hypertension and congestive heart failure.  He is followed by Dr. Peter [name] cardiologist.  He typically takes Coreg twice daily.  Patient states he missed only 1 dose of his coreg earlier this morning.  While receiving IV fluid rehydration in the infusion center-patient developed palpitations with bradycardia down to 38 at times.  Patient denied any chest pain, chest pressure, shortness breath, or pain with inspiration.  Obtained EKG which revealed sinus rhythm with frequent PACs and PVCs.  Ventricular rate was 85; with a QTC of 442.  HPI  ROS  Past Medical History  Diagnosis Date  . Hypertension   . Dilated cardiomyopathy   . Hypercholesterolemia   . DIVERTICULOSIS, COLON 12/08/2007  . ARTHRITIS 10/22/2008  . History of echocardiogram 05/22/2007    EF was 45-50% / Mild concentric LV hypertrophy with mild global hypokinesis and overall mild systolic dysfunction .  Mild AV sclerosis / Mild Mitral insufficiency / compared to prior study 04/24/02 -- LV function has improved further.    . CHF (congestive heart failure)     sees Dr. Peter [name]   . BPH (benign prostatic hyperplasia)   . Squamous cell carcinoma 11/20/14    base of tongue primary  . Skin cancer     squamous cell, basal cell  . H/O asbestos exposure   . Glaucoma     Past Surgical History  Procedure Laterality Date  . Cardiac catheterization  10/17/2001    EF estimated at 30% / moderate LV enlargement  / 1. Minimal nonobstructive atherosclerotic coronary artery disease / 2. Severe LV dysfunction with global hypokinesia consistent with dilated nonischemic cardiomyopath / 3. Moderate pulmonary hypertension  . Colonoscopy  08-15-07    per Dr. Carlean Purl,  clear , repeat in 10 yrs  . Lymph node biopsy      has Hypercholesterolemia; Essential hypertension; Congestive dilated cardiomyopathy; INTERNAL HEMORRHOIDS; EXTERNAL HEMORRHOIDS; DIVERTICULOSIS, COLON; RECTAL BLEEDING; ARTHRITIS; BURSITIS, LEFT SHOULDER; MUSCLE STRAIN, ABDOMINAL WALL; CONGESTIVE HEART FAILURE, HX OF; HX, PERSONAL, ARTHRITIS; Chronic systolic CHF (congestive heart failure); Cancer of base of tongue; History of skin cancer; Nausea without vomiting; Weight loss; Mucositis due to chemotherapy; Leukopenia due to antineoplastic chemotherapy; Dehydration; and Palpitations on his problem list.    has No Known Allergies.    Medication List       This list is accurate as of: 01/24/15  6:40 PM.  Always use your most recent med list.               ASPIRIN PO  Take 81 mg by mouth every morning.     carvedilol 25 MG tablet  Commonly known as:  COREG  Take 1 tablet (25 mg total) by mouth 2 (two) times daily with a meal.     fentaNYL 25 MCG/HR patch  Commonly known as:  DURAGESIC - dosed mcg/hr  Place 1 patch (25 mcg total) onto the skin every 3 (three) days.     hyaluronate sodium Gel  Apply 1 application topically 2 (two) times daily.     lidocaine 2 % solution  Commonly known as:  XYLOCAINE  Patient: Mix 1part 2% viscous lidocaine, 1part H20. Swish and/or swallow 42m of this mixture, 359m before meals and at bedtime, up to QID  lidocaine-prilocaine cream  Commonly known as:  EMLA  Apply to affected area once     LORazepam 0.5 MG tablet  Commonly known as:  ATIVAN  Take 1-2 tablets PRN claustrophobia; take 20 minutes before wearing radiotherapy mask.     morphine 20 MG/ML concentrated solution  Commonly known as:  ROXANOL  Take 0.5 mLs (10 mg total) by mouth every 2 (two) hours as needed for severe pain.     multivitamin per tablet  Take 1 tablet by mouth every morning.     ondansetron 8 MG tablet  Commonly known as:  ZOFRAN  Take 1 tablet (8 mg total) by  mouth every 8 (eight) hours as needed.     prochlorperazine 10 MG tablet  Commonly known as:  COMPAZINE  Take 1 tablet (10 mg total) by mouth every 6 (six) hours as needed (Nausea or vomiting).     simvastatin 40 MG tablet  Commonly known as:  ZOCOR  Take 1 tablet (40 mg total) by mouth at bedtime.     sodium fluoride 1.1 % Gel dental gel  Commonly known as:  FLUORISHIELD  Instill one drop of gel per tooth space of fluoride tray. Place over teeth for 5 minutes. Remove. Spit out excess. Repeat nightly.     sucralfate 1 G tablet  Commonly known as:  CARAFATE  Dissolve 1 tablet in 10 mL H20 and swallow 30 min prior to meals and bedtime.     tadalafil 20 MG tablet  Commonly known as:  CIALIS  Take 1 tablet (20 mg total) by mouth daily as needed for erectile dysfunction.     tamsulosin 0.4 MG Caps capsule  Commonly known as:  FLOMAX  Take 1 capsule (0.4 mg total) by mouth daily.     TRAVATAN Z 0.004 % Soln ophthalmic solution  Generic drug:  Travoprost (BAK Free)  Place 1 drop into both eyes at bedtime.         PHYSICAL EXAMINATION  Oncology Vitals 01/24/2015 01/24/2015 01/22/2015 01/21/2015 01/20/2015 01/20/2015 01/14/2015  Height - - - 175 cm - - 175 cm  Weight - - - 76.567 kg - 76.93 kg 80.151 kg  Weight (lbs) - - - 168 lbs 13 oz - 169 lbs 10 oz 176 lbs 11 oz  BMI (kg/m2) - - - 24.93 kg/m2 - - 26.09 kg/m2  Temp - 97.8 98.5 98.3 - 98.4 98.4  Pulse 86 80 85 94 91 80 79  Resp - - - 17 - 16 18  SpO2 100 99 98 98 - 100 -  BSA (m2) - - - 1.93 m2 - - 1.98 m2   BP Readings from Last 3 Encounters:  01/24/15 138/81  01/22/15 118/66  01/21/15 112/69    Physical Exam  Constitutional: He is oriented to person, place, and time.  Patient appears fatigued, frail, and chronically ill.  HENT:  Head: Normocephalic and atraumatic.  Eyes: Conjunctivae and EOM are normal. Pupils are equal, round, and reactive to light.  Neck: Normal range of motion.  Cardiovascular:  Irregular rate.    Pulmonary/Chest: Effort normal. No respiratory distress.  Musculoskeletal: Normal range of motion.  Neurological: He is alert and oriented to person, place, and time.  Skin: Skin is warm and dry.  Psychiatric: Affect normal.  Nursing note and vitals reviewed.   LABORATORY DATA:. No visits with results within 3 Day(s) from this visit. Latest known visit with results is:  Appointment on 01/21/2015  Component Date Value Ref Range Status  .  WBC 01/21/2015 2.6* 4.0 - 10.3 10e3/uL Final  . NEUT# 01/21/2015 1.3* 1.5 - 6.5 10e3/uL Final  . HGB 01/21/2015 15.4  13.0 - 17.1 g/dL Final  . HCT 01/21/2015 46.6  38.4 - 49.9 % Final  . Platelets 01/21/2015 219  140 - 400 10e3/uL Final  . MCV 01/21/2015 89.1  79.3 - 98.0 fL Final  . MCH 01/21/2015 29.4  27.2 - 33.4 pg Final  . MCHC 01/21/2015 33.0  32.0 - 36.0 g/dL Final  . RBC 01/21/2015 5.23  4.20 - 5.82 10e6/uL Final  . RDW 01/21/2015 12.8  11.0 - 14.6 % Final  . lymph# 01/21/2015 0.5* 0.9 - 3.3 10e3/uL Final  . MONO# 01/21/2015 0.7  0.1 - 0.9 10e3/uL Final  . Eosinophils Absolute 01/21/2015 0.0  0.0 - 0.5 10e3/uL Final  . Basophils Absolute 01/21/2015 0.0  0.0 - 0.1 10e3/uL Final  . NEUT% 01/21/2015 51.5  39.0 - 75.0 % Final  . LYMPH% 01/21/2015 19.8  14.0 - 49.0 % Final  . MONO% 01/21/2015 26.7* 0.0 - 14.0 % Final  . EOS% 01/21/2015 1.6  0.0 - 7.0 % Final  . BASO% 01/21/2015 0.4  0.0 - 2.0 % Final  . Sodium 01/21/2015 139  136 - 145 mEq/L Final  . Potassium 01/21/2015 3.8  3.5 - 5.1 mEq/L Final  . Chloride 01/21/2015 101  98 - 109 mEq/L Final  . CO2 01/21/2015 29  22 - 29 mEq/L Final  . Glucose 01/21/2015 134  70 - 140 mg/dl Final  . BUN 01/21/2015 17.3  7.0 - 26.0 mg/dL Final  . Creatinine 01/21/2015 1.1  0.7 - 1.3 mg/dL Final  . Total Bilirubin 01/21/2015 0.29  0.20 - 1.20 mg/dL Final  . Alkaline Phosphatase 01/21/2015 86  40 - 150 U/L Final  . AST 01/21/2015 12  5 - 34 U/L Final  . ALT 01/21/2015 14  0 - 55 U/L Final  . Total  Protein 01/21/2015 7.1  6.4 - 8.3 g/dL Final  . Albumin 01/21/2015 3.2* 3.5 - 5.0 g/dL Final  . Calcium 01/21/2015 9.5  8.4 - 10.4 mg/dL Final  . Anion Gap 01/21/2015 9  3 - 11 mEq/L Final  . EGFR 01/21/2015 71* >90 ml/min/1.73 m2 Final   eGFR is calculated using the CKD-EPI Creatinine Equation (2009)  . Magnesium 01/21/2015 2.4  1.5 - 2.5 mg/dl Final   EkgTAKEEM, KROTZER IO:[id number] 24-Jan-2015 15:19:20 [facility] ONC ROUTINE RECORD Sinus rhythm with occasional Premature ventricular complexes Nonspecific T wave abnormality Abnormal ECG Since previous tracing improved tracing technically. no evidence of anterior infarct age undetermined. QT interval is upper normal. Confirmed by [name] MD, PETER [phone]) on 01/24/2015 5:25:33 PM 97m/s 147mmV 100Hz  8.0 SP2 12SL 237 CID: [id number] Referred by: Confirmed By: PETER JOMartiniqueD Vent. rate 82 BPM PR interval 156 ms QRS duration 94 ms QT/QTc 396/462 ms P-R-T axes 59 -7 0 02-Sep-1941 (73 yr) Male Caucasian Room: Loc:511 Technician: LETITI  RADIOGRAPHIC STUDIES: No results found.  ASSESSMENT/PLAN:    Cancer of base of tongue Patient received cycle 2 of his cisplatin chemotherapy on 01/22/2015.  The cisplatin chemotherapy was reduced by 25% due to poor tolerability.  Patient also continues with radiation therapy on a daily basis.  His final radiation treatment will be 02/18/2015.  Patient has plans to return for cycle 3 of the same regimen on 02/12/2015.  Patient will also continue to receive dexamethasone intravenously for the 2 days following chemotherapy in hopes this will assist in his  tolerability.   Dehydration Patient reports that he is able to take in some oral intake by mouth.  He continues to receive most of his nutrition via his PEG tube.  He will continue with daily IV fluid rehydration for the time being.  Patient has plans to return this coming Monday, 01/27/2015 for additional IV fluid rehydration.     Palpitations Patient has a history of hypertension and congestive heart failure.  He is followed by Dr. Peter [name] cardiologist.  He typically takes Coreg twice daily.  Patient states he missed only 1 dose of his coreg earlier this morning.  While receiving IV fluid rehydration in the infusion center-patient developed palpitations with bradycardia down to 38 at times.  Patient denied any chest pain, chest pressure, shortness breath, or pain with inspiration.  Obtained EKG which revealed sinus rhythm with frequent PACs and PVCs.  Ventricular rate was 85; with a QTC of 442.  Called and spoke to the prior or heart care cardiologist on call for Dr. Trinna Post who reviewed EKGs.  Dr. Stanford Breed advised patient to continue with IV fluid rehydration and be discharged to home as long as he was asymptomatic.  Advised patient to make sure he took his evening dose of coreg once he returns home this evening.  Also, advised both patient and his wife to call/return of electrical to the emergency department over the weekend if he develops any worsening symptoms whatsoever.    Patient stated understanding of all instructions; and was in agreement with this plan of care. The patient knows to call the clinic with any problems, questions or concerns.   Review/collaboration with Dr. Alvy Bimler regarding all aspects of patient's visit today.   Total time spent with patient was 40 minutes;  with greater than 75 percent of that time spent in face to face counseling regarding patient's symptoms,  and coordination of care and follow up.  Disclaimer: This note was dictated with voice recognition software. Similar sounding words can inadvertently be transcribed and may not be corrected upon review.   Drue Second, NP 01/24/2015

## 2015-01-24 NOTE — Patient Instructions (Signed)

## 2015-01-24 NOTE — Assessment & Plan Note (Signed)
Patient received cycle 2 of his cisplatin chemotherapy on 01/22/2015.  The cisplatin chemotherapy was reduced by 25% due to poor tolerability.  Patient also continues with radiation therapy on a daily basis.  His final radiation treatment will be 02/18/2015.  Patient has plans to return for cycle 3 of the same regimen on 02/12/2015.  Patient will also continue to receive dexamethasone intravenously for the 2 days following chemotherapy in hopes this will assist in his tolerability.

## 2015-01-24 NOTE — Telephone Encounter (Signed)
SPOKE TO CINDY BACON NP SHE WOULD LIKE FOR A CARDIOLOGIST REVIEW EKG THAT WERE DONE AT THE CANCER CENTER. PATIENT DX WITH TONGUE CANCER AND THEIR TODAY FOR TREATMENT  PER CINDY , RATE HAS BEEN 38 TO 100- EKG'S SHOW PAC'S AND PVC'S RN REVIEWED INFORMATION WITH DR Stanford Breed. PER DR CRENSHAW - EKG SHOWS PAC'S AND PVC'S. IF NO SYMPTOMS PATIENT DOES NOT NEED TO GO TO THE ER. INFORMATION GIVEN TO CINDY NP. SHE VERBALIZED UNDERSTANDING.

## 2015-01-24 NOTE — Assessment & Plan Note (Addendum)
Patient has a history of hypertension and congestive heart failure.  He is followed by Dr. Peter [name] cardiologist.  He typically takes Coreg twice daily.  Patient states he missed only 1 dose of his coreg earlier this morning.  While receiving IV fluid rehydration in the infusion center-patient developed palpitations with bradycardia down to 38 at times.  Patient denied any chest pain, chest pressure, shortness breath, or pain with inspiration.  Obtained EKG which revealed sinus rhythm with frequent PACs and PVCs.  Ventricular rate was 85; with a QTC of 442.  Called and spoke to the prior or heart care cardiologist on call for Dr. Trinna Post who reviewed EKGs.  Dr. Stanford Breed advised patient to continue with IV fluid rehydration and be discharged to home as long as he was asymptomatic.  Advised patient to make sure he took his evening dose of coreg once he returns home this evening.  Also, advised both patient and his wife to call/return of electrical to the emergency department over the weekend if he develops any worsening symptoms whatsoever.

## 2015-01-27 ENCOUNTER — Ambulatory Visit
Admission: RE | Admit: 2015-01-27 | Discharge: 2015-01-27 | Disposition: A | Discharge status: Home / Self Care | Payer: Medicare Other | Source: Ambulatory Visit | Attending: Radiation Oncology | Admitting: Radiation Oncology

## 2015-01-27 ENCOUNTER — Ambulatory Visit (HOSPITAL_COMMUNITY)
Admission: RE | Admit: 2015-01-27 | Discharge: 2015-01-27 | Disposition: A | Discharge status: Home / Self Care | Payer: Medicare Other | Source: Ambulatory Visit | Attending: Hematology and Oncology | Admitting: Hematology and Oncology

## 2015-01-27 ENCOUNTER — Encounter: Payer: Self-pay | Admitting: Radiation Oncology

## 2015-01-27 VITALS — BP 118/73 | HR 97 | Temp 98.4°F | Resp 10 | Wt 170.5 lb

## 2015-01-27 DIAGNOSIS — Z510 Encounter for antineoplastic radiation therapy: Secondary | ICD-10-CM | POA: Diagnosis not present

## 2015-01-27 DIAGNOSIS — C01 Malignant neoplasm of base of tongue: Secondary | ICD-10-CM | POA: Diagnosis not present

## 2015-01-27 MED ORDER — HEPARIN SOD (PORK) LOCK FLUSH 100 UNIT/ML IV SOLN
500.0000 [IU] | Freq: Once | INTRAVENOUS | Status: AC
Start: 2015-01-27 — End: 2015-01-27
  Administered 2015-01-27: 500 [IU] via INTRAVENOUS
  Filled 2015-01-27: qty 5

## 2015-01-27 MED ORDER — SODIUM CHLORIDE 0.9 % IJ SOLN: 10.0000 mL | Freq: Once | INTRAMUSCULAR | Status: DC

## 2015-01-27 MED ORDER — LORAZEPAM 0.5 MG PO TABS: ORAL_TABLET | ORAL | Status: DC

## 2015-01-27 MED ORDER — PROMETHAZINE HCL 25 MG/ML IJ SOLN
12.5000 mg | Freq: Once | INTRAMUSCULAR | Status: AC
  Administered 2015-01-27: 12.5 mg via INTRAVENOUS
  Filled 2015-01-27: qty 1

## 2015-01-27 MED ORDER — HYDROMORPHONE HCL 4 MG/ML IJ SOLN: 1.0000 mg | INTRAMUSCULAR | Status: DC | PRN

## 2015-01-27 MED ORDER — SODIUM CHLORIDE 0.9 % IV SOLN
INTRAVENOUS | Status: DC
  Administered 2015-01-27: 13:00:00 via INTRAVENOUS

## 2015-01-27 MED ORDER — FLUCONAZOLE 100 MG PO TABS: ORAL_TABLET | ORAL | Status: DC

## 2015-01-27 NOTE — Progress Notes (Signed)
   Weekly Management Note:  Outpatient    ICD-9-CM ICD-10-CM   1. Cancer of base of tongue 141.0 C01 fluconazole (DIFLUCAN) 100 MG tablet     LORazepam (ATIVAN) 0.5 MG tablet    Current Dose:  28 Gy  Projected Dose: 70 Gy   Narrative:  The patient presents for routine under treatment assessment.  CBCT/MVCT images/Port film x-rays were reviewed.  The chart was checked.  No pain. IV fluids help how he feels. Most nutrition by peg, but does swallow. Weight stable    BP 118/73 mmHg  Pulse 97  Temp(Src) 98.4 F (36.9 C) (Oral)  Resp 10  Wt 170 lb 8 oz (77.338 kg)  SpO2 99% Physical Findings:  Wt Readings from Last 3 Encounters:  01/21/15 168 lb 12.8 oz (76.567 kg)  01/20/15 169 lb 9.6 oz (76.93 kg)  01/14/15 176 lb 11.2 oz (80.151 kg)    weight is 170 lb 8 oz (77.338 kg). His oral temperature is 98.4 F (36.9 C). His blood pressure is 118/73 and his pulse is 97. His respiration is 10 and oxygen saturation is 99%.  NAD, skin is dry; + thrush and patchy mucositis, mucosa dry  CBC    Component Value Date/Time   WBC 2.6* 01/21/2015 1349   WBC 7.0 12/24/2014 0755   RBC 5.23 01/21/2015 1349   RBC 5.19 12/24/2014 0755   HGB 15.4 01/21/2015 1349   HGB 15.3 12/24/2014 0755   HCT 46.6 01/21/2015 1349   HCT 46.5 12/24/2014 0755   PLT 219 01/21/2015 1349   PLT 189 12/24/2014 0755   MCV 89.1 01/21/2015 1349   MCV 89.6 12/24/2014 0755   MCH 29.4 01/21/2015 1349   MCH 29.5 12/24/2014 0755   MCHC 33.0 01/21/2015 1349   MCHC 32.9 12/24/2014 0755   RDW 12.8 01/21/2015 1349   RDW 13.8 12/24/2014 0755   LYMPHSABS 0.5* 01/21/2015 1349   LYMPHSABS 1.7 12/24/2014 0755   MONOABS 0.7 01/21/2015 1349   MONOABS 0.7 12/24/2014 0755   EOSABS 0.0 01/21/2015 1349   EOSABS 0.1 12/24/2014 0755   BASOSABS 0.0 01/21/2015 1349   BASOSABS 0.1 12/24/2014 0755     CMP     Component Value Date/Time   NA 139 01/21/2015 1349   NA 141 12/24/2014 0755   K 3.8 01/21/2015 1349   K 3.8 12/24/2014  0755   CL 109 12/24/2014 0755   CO2 29 01/21/2015 1349   CO2 27 12/24/2014 0755   GLUCOSE 134 01/21/2015 1349   GLUCOSE 118* 12/24/2014 0755   BUN 17.3 01/21/2015 1349   BUN 15 12/24/2014 0755   CREATININE 1.1 01/21/2015 1349   CREATININE 0.99 12/24/2014 0755   CALCIUM 9.5 01/21/2015 1349   CALCIUM 8.9 12/24/2014 0755   PROT 7.1 01/21/2015 1349   PROT 6.9 12/24/2014 0755   ALBUMIN 3.2* 01/21/2015 1349   ALBUMIN 4.0 12/24/2014 0755   AST 12 01/21/2015 1349   AST 21 12/24/2014 0755   ALT 14 01/21/2015 1349   ALT 25 12/24/2014 0755   ALKPHOS 86 01/21/2015 1349   ALKPHOS 63 12/24/2014 0755   BILITOT 0.29 01/21/2015 1349   BILITOT 0.7 12/24/2014 0755   GFRNONAA 80* 12/24/2014 0755   GFRAA >90 12/24/2014 0755     Impression:  The patient is tolerating radiotherapy.   Plan:  Continue radiotherapy as planned.    Fluconazole for thrush -----------------------------------  Eppie Gibson, MD

## 2015-01-27 NOTE — Progress Notes (Signed)
To provide support and encouragement, care continuity and to assess for needs, met with patient prior to [place].  His wife accompanied him, I spoke with her while he received tmt. 1. She expressed concern about bradycardia noted while husband receiving IVF earlier.  She noted he had forgotten to take his morning Coreg  She indicated Leland Johns, NP, had been notified. 2. I assured her that symptoms would be addressed and proper advice provided.  Gayleen Orem, RN, BSN, [facility] [place] [phone]

## 2015-01-27 NOTE — Progress Notes (Signed)

## 2015-01-27 NOTE — Procedures (Signed)
[facility]  Procedure Note  Nicholas Wilkerson FEO:[id number] DOB: August 31, 1941 DOA: 01/27/2015   PCP: Laurey Morale, MD   Associated Diagnosis: Cancer of base of tongue (141.0)  Procedure Note: Infusion of IV fluids   Condition During Procedure:  Pt tolerated well   Condition at Discharge:  No complications noted   Nigel Sloop, [facility]

## 2015-01-27 NOTE — Progress Notes (Signed)
He is currently in no pain. Pt complains of fatigue, weakness and poor appetite.  Pt denies dysphagia. Pt reports a snacking but mostly receives most nutrition via GT. formula-osmolite 1.5, 4-5 cans per day, 300 + ml H20 flush. Oral intake of Boost/Ensure 1-2 cans orally. Oral exam reveals white exudate with white sputum. Pt reports a dry mouth thick white sputum. Skin exam reveals Dryness, Hyperpigmentation, Pruritus, dry desquamation and erythema.  Pt reports Constipation, a bowel movement every 3 days. Constipation handout given. Wt Readings from Last 3 Encounters:  01/21/15 168 lb 12.8 oz (76.567 kg)  01/20/15 169 lb 9.6 oz (76.93 kg)  01/14/15 176 lb 11.2 oz (80.151 kg)   BP 118/73 mmHg  Pulse 97  Temp(Src) 98.4 F (36.9 C) (Oral)  Resp 10  Wt 170 lb 8 oz (77.338 kg)  SpO2 99%  Orthostatic standing vital signs- BP-118/73, P-97, POX-99%

## 2015-01-28 ENCOUNTER — Telehealth: Payer: Self-pay | Admitting: *Deleted

## 2015-01-28 ENCOUNTER — Ambulatory Visit
Admission: RE | Admit: 2015-01-28 | Discharge: 2015-01-28 | Disposition: A | Discharge status: Home / Self Care | Payer: Medicare Other | Source: Ambulatory Visit | Attending: Radiation Oncology | Admitting: Radiation Oncology

## 2015-01-28 ENCOUNTER — Ambulatory Visit (HOSPITAL_COMMUNITY)
Admission: RE | Admit: 2015-01-28 | Discharge: 2015-01-28 | Disposition: A | Discharge status: Home / Self Care | Payer: Medicare Other | Source: Ambulatory Visit | Attending: Hematology and Oncology | Admitting: Hematology and Oncology

## 2015-01-28 DIAGNOSIS — C01 Malignant neoplasm of base of tongue: Secondary | ICD-10-CM

## 2015-01-28 DIAGNOSIS — Z510 Encounter for antineoplastic radiation therapy: Secondary | ICD-10-CM | POA: Diagnosis not present

## 2015-01-28 MED ORDER — HYDROMORPHONE HCL 4 MG/ML IJ SOLN: 1.0000 mg | INTRAMUSCULAR | Status: DC | PRN

## 2015-01-28 MED ORDER — SODIUM CHLORIDE 0.9 % IJ SOLN
10.0000 mL | Freq: Once | INTRAMUSCULAR | Status: AC
  Administered 2015-01-28: 10 mL via INTRAVENOUS

## 2015-01-28 MED ORDER — SODIUM CHLORIDE 0.9 % IV SOLN
INTRAVENOUS | Status: DC
Start: 2015-01-28 — End: 2015-01-29
  Administered 2015-01-28: 14:00:00 via INTRAVENOUS

## 2015-01-28 MED ORDER — HEPARIN SOD (PORK) LOCK FLUSH 100 UNIT/ML IV SOLN
500.0000 [IU] | Freq: Once | INTRAVENOUS | Status: AC
  Administered 2015-01-28: 500 [IU] via INTRAVENOUS
  Filled 2015-01-28: qty 5

## 2015-01-28 MED ORDER — PROMETHAZINE HCL 25 MG/ML IJ SOLN
12.5000 mg | Freq: Once | INTRAMUSCULAR | Status: AC
  Administered 2015-01-28: 12.5 mg via INTRAVENOUS
  Filled 2015-01-28: qty 1

## 2015-01-28 NOTE — Telephone Encounter (Signed)
Called pt on cell number to check on patient following his visit on Friday-  Pt experienced palpitations during treatment on 4/15. Cyndee saw him during his treatment.

## 2015-01-28 NOTE — Procedures (Signed)
[facility]  Procedure Note  Nicholas Wilkerson XQJ:[id number] DOB: 1941/02/10 DOA: 01/28/2015   Dr. Alvy Bimler   Associated Diagnosis: C01  Procedure Note: Port accessed, IV fluids infused per order, Phenergan given per order, Port flushed and de-accessed per order.   Condition During Procedure:  Patient stable, denies any discomfort   Condition at Discharge:  Patient ambulatory at discharge.  Wife at bedside.   Roberto Scales, RN  [facility]

## 2015-01-29 ENCOUNTER — Other Ambulatory Visit: Payer: Self-pay | Admitting: Hematology and Oncology

## 2015-01-29 ENCOUNTER — Telehealth: Payer: Self-pay | Admitting: Hematology and Oncology

## 2015-01-29 ENCOUNTER — Ambulatory Visit (HOSPITAL_BASED_OUTPATIENT_CLINIC_OR_DEPARTMENT_OTHER): Payer: Medicare Other | Admitting: Hematology and Oncology

## 2015-01-29 ENCOUNTER — Ambulatory Visit: Payer: Medicare Other

## 2015-01-29 ENCOUNTER — Ambulatory Visit (HOSPITAL_BASED_OUTPATIENT_CLINIC_OR_DEPARTMENT_OTHER): Payer: Medicare Other

## 2015-01-29 ENCOUNTER — Ambulatory Visit
Admission: RE | Admit: 2015-01-29 | Discharge: 2015-01-29 | Disposition: A | Discharge status: Home / Self Care | Payer: Medicare Other | Source: Ambulatory Visit | Attending: Radiation Oncology | Admitting: Radiation Oncology

## 2015-01-29 ENCOUNTER — Ambulatory Visit: Payer: Medicare Other | Admitting: Nutrition

## 2015-01-29 VITALS — BP 124/64 | HR 72 | Temp 97.9°F | Resp 16

## 2015-01-29 DIAGNOSIS — C01 Malignant neoplasm of base of tongue: Secondary | ICD-10-CM

## 2015-01-29 DIAGNOSIS — Z510 Encounter for antineoplastic radiation therapy: Secondary | ICD-10-CM | POA: Diagnosis not present

## 2015-01-29 DIAGNOSIS — K1231 Oral mucositis (ulcerative) due to antineoplastic therapy: Secondary | ICD-10-CM

## 2015-01-29 DIAGNOSIS — R110 Nausea: Secondary | ICD-10-CM

## 2015-01-29 DIAGNOSIS — R634 Abnormal weight loss: Secondary | ICD-10-CM | POA: Diagnosis not present

## 2015-01-29 MED ORDER — HEPARIN SOD (PORK) LOCK FLUSH 100 UNIT/ML IV SOLN
500.0000 [IU] | Freq: Once | INTRAVENOUS | Status: AC | PRN
  Administered 2015-01-29: 500 [IU]
  Filled 2015-01-29: qty 5

## 2015-01-29 MED ORDER — SODIUM CHLORIDE 0.9 % IV SOLN
Freq: Once | INTRAVENOUS | Status: AC
  Administered 2015-01-29: 14:00:00 via INTRAVENOUS

## 2015-01-29 MED ORDER — SODIUM CHLORIDE 0.9 % IJ SOLN
10.0000 mL | INTRAMUSCULAR | Status: DC | PRN
  Administered 2015-01-29: 10 mL
  Filled 2015-01-29: qty 10

## 2015-01-29 NOTE — Progress Notes (Signed)
Nutrition follow-up completed with patient and wife, during infusion. Patient states he is consuming to Ensure Plus by mouth once a day plus Osmolite 1.5 via bolus tube feeding. Weight has increased and was documented as 170.5 pounds April 18, which has increased from 168.8 pounds April 12. Patient states he has constipation but has been given information about strategies for resolution. Patient requires additional samples of Osmolite 1.5.  Nutrition diagnosis: Inadequate oral intake continues.  Intervention:  Patient will continue to supplement oral intake with Osmolite 1.5 via feeding tube. Patient will increase total tube feedings to 6 cans daily. He will continue to drink Ensure Plus or boost plus by mouth as well as soft solids when able. Patient will resolve constipation with medications and increased fluids. Teach back method used. Samples were provided.  Monitoring, evaluation, goals: Patient will tolerate tube feeding plus oral intake to promote continued weight gain.  Next visit: Thursday, April 21.  **Disclaimer: This note was dictated with voice recognition software. Similar sounding words can inadvertently be transcribed and this note may contain transcription errors which may not have been corrected upon publication of note.**

## 2015-01-29 NOTE — Patient Instructions (Signed)

## 2015-01-29 NOTE — Telephone Encounter (Signed)
s.w. pt and he will get print out from chemo room

## 2015-01-30 ENCOUNTER — Encounter: Payer: Medicare Other | Admitting: Nutrition

## 2015-01-30 ENCOUNTER — Encounter: Payer: Self-pay | Admitting: Hematology and Oncology

## 2015-01-30 ENCOUNTER — Ambulatory Visit (HOSPITAL_COMMUNITY)
Admission: RE | Admit: 2015-01-30 | Discharge: 2015-01-30 | Disposition: A | Discharge status: Home / Self Care | Payer: Medicare Other | Source: Ambulatory Visit | Attending: Hematology and Oncology | Admitting: Hematology and Oncology

## 2015-01-30 ENCOUNTER — Ambulatory Visit
Admission: RE | Admit: 2015-01-30 | Discharge: 2015-01-30 | Disposition: A | Discharge status: Home / Self Care | Payer: Medicare Other | Source: Ambulatory Visit | Attending: Radiation Oncology | Admitting: Radiation Oncology

## 2015-01-30 DIAGNOSIS — Z510 Encounter for antineoplastic radiation therapy: Secondary | ICD-10-CM | POA: Diagnosis not present

## 2015-01-30 DIAGNOSIS — C01 Malignant neoplasm of base of tongue: Secondary | ICD-10-CM

## 2015-01-30 MED ORDER — HYDROMORPHONE HCL 4 MG/ML IJ SOLN: 1.0000 mg | INTRAMUSCULAR | Status: DC | PRN

## 2015-01-30 MED ORDER — SODIUM CHLORIDE 0.9 % IV SOLN
INTRAVENOUS | Status: DC
  Administered 2015-01-30: 13:00:00 via INTRAVENOUS

## 2015-01-30 MED ORDER — SODIUM CHLORIDE 0.9 % IJ SOLN
10.0000 mL | Freq: Once | INTRAMUSCULAR | Status: AC
  Administered 2015-01-30: 10 mL via INTRAVENOUS

## 2015-01-30 MED ORDER — HEPARIN SOD (PORK) LOCK FLUSH 100 UNIT/ML IV SOLN
500.0000 [IU] | Freq: Once | INTRAVENOUS | Status: AC
  Administered 2015-01-30: 500 [IU] via INTRAVENOUS
  Filled 2015-01-30: qty 5

## 2015-01-30 MED ORDER — PROMETHAZINE HCL 25 MG/ML IJ SOLN
12.5000 mg | Freq: Once | INTRAMUSCULAR | Status: AC
  Administered 2015-01-30: 12.5 mg via INTRAVENOUS
  Filled 2015-01-30: qty 1

## 2015-01-30 NOTE — Procedures (Addendum)
Associated diagnosis: cancer of the base of the tongue 141.0 Procedure note: Port accessed, visualized blood return, IV fluids infused, phenergan given as ordered. Port flushed and de accessed per protocol. Condition during diagnosis: Patient tolerated well. Denies any discomfort.  Condition at discharge: Patient ambulatory, alert and oriented at discharge.

## 2015-01-30 NOTE — Assessment & Plan Note (Signed)
He has started to experience side effects from treatment. He was prescribed viscous lidocaine. I recommend also liquid morphine for pain as needed.  I plan to add on fentanyl patch for better pain control

## 2015-01-30 NOTE — Assessment & Plan Note (Signed)
He tolerated cycle one poorly due to nausea. With reduced dose chemotherapy for cycle 2 and the addition of intravenous IV dexamethasone for 2 days after treatment, he denies further nausea. He felt that IV fluids daily is very helpful. In the meantime, continue supportive care He is starting to experience multiple side effects from treatment and I will monitor for them carefully.  I recommend we start IV fluid support for the remainder of his radiation treatment

## 2015-01-30 NOTE — Assessment & Plan Note (Signed)
He is able to maintain his weight. I will get dietitian to follow-up on his recent weight loss today.

## 2015-01-30 NOTE — Progress Notes (Signed)
[place] OFFICE PROGRESS NOTE  Patient Care Team: Laurey Morale, MD as PCP - General (Family Medicine) Heath Lark, MD as Consulting Physician (Hematology and Oncology)  SUMMARY OF ONCOLOGIC HISTORY: Oncology History   Cancer of base of tongue   Staging form: Lip and Oral Cavity, AJCC 7th Edition     Clinical stage from 12/03/2014: Stage IVA (T2, N2a, M0) - Signed by Heath Lark, MD on 12/13/2014 HPV negative           Cancer of base of tongue   11/20/2014 Pathology Results Accession: [id number] confirm squamous cell carcinoma.   11/20/2014 Procedure The patient underwent fine-needle aspirate of the right lymph node   12/02/2014 Imaging CT scan showed 2.2 cm the right tongue base mass with necrotic 4 cm right level II nodal metastasis with multiple bilateral small lymphadenopathy   12/10/2014 Imaging PET CT scan showed tongue base mass with right level II lymph node metastasis   12/24/2014 Procedure He has placement of port and feeding tube   01/01/2015 -  Chemotherapy He received high dose cisplatin   01/01/2015 -  Radiation Therapy He received concurrent radiation treatment   01/21/2015 Adverse Reaction  cycle 2 chemotherapy dose will be reduced by 25% due to intolerable side effects    INTERVAL HISTORY: Please see below for problem oriented charting. He is seen at infusion center for further follow-up. His pain is better controlled. He denies recent nausea and vomiting. He felt that IV fluids has helped him tremendously with hydration.  REVIEW OF SYSTEMS:   Constitutional: Denies fevers, chills or abnormal weight loss Eyes: Denies blurriness of vision Respiratory: Denies cough, dyspnea or wheezes Cardiovascular: Denies palpitation, chest discomfort or lower extremity swelling Gastrointestinal:  Denies nausea, heartburn or change in bowel habits Skin: Denies abnormal skin rashes Lymphatics: Denies new lymphadenopathy or easy bruising Neurological:Denies numbness,  tingling or new weaknesses Behavioral/Psych: Mood is stable, no new changes  All other systems were reviewed with the patient and are negative.  I have reviewed the past medical history, past surgical history, social history and family history with the patient and they are unchanged from previous note.  ALLERGIES:  has No Known Allergies.  MEDICATIONS:  Current Outpatient Prescriptions  Medication Sig Dispense Refill  . ASPIRIN PO Take 81 mg by mouth every morning.     . brimonidine (ALPHAGAN) 0.15 % ophthalmic solution   2  . carvedilol (COREG) 25 MG tablet Take 1 tablet (25 mg total) by mouth 2 (two) times daily with a meal. 180 tablet 3  . fentaNYL (DURAGESIC - DOSED MCG/HR) 25 MCG/HR patch Place 1 patch (25 mcg total) onto the skin every 3 (three) days. 5 patch 0  . fluconazole (DIFLUCAN) 100 MG tablet Take 2 tablets today, then 1 tablet daily x 20 more days. 22 tablet 0  . hyaluronate sodium (RADIAPLEXRX) GEL Apply 1 application topically 2 (two) times daily.    Marland Kitchen lidocaine (XYLOCAINE) 2 % solution Patient: Mix 1part 2% viscous lidocaine, 1part H20. Swish and/or swallow 77mL of this mixture, 56min before meals and at bedtime, up to QID 100 mL 5  . lidocaine-prilocaine (EMLA) cream Apply to affected area once 30 g 3  . LORazepam (ATIVAN) 0.5 MG tablet Take 1 tab up to TID PRN anxiety, nausea, or claustrophobia; take 20 minutes before wearing radiotherapy mask. 60 tablet 0  . morphine (ROXANOL) 20 MG/ML concentrated solution Take 0.5 mLs (10 mg total) by mouth every 2 (two) hours as needed for  severe pain. 120 mL 0  . multivitamin (THERAGRAN) per tablet Take 1 tablet by mouth every morning.     . ondansetron (ZOFRAN) 8 MG tablet Take 1 tablet (8 mg total) by mouth every 8 (eight) hours as needed. 30 tablet 1  . prochlorperazine (COMPAZINE) 10 MG tablet Take 1 tablet (10 mg total) by mouth every 6 (six) hours as needed (Nausea or vomiting). 30 tablet 1  . simvastatin (ZOCOR) 40 MG tablet  Take 1 tablet (40 mg total) by mouth at bedtime. 90 tablet 3  . sodium fluoride (FLUORISHIELD) 1.1 % GEL dental gel Instill one drop of gel per tooth space of fluoride tray. Place over teeth for 5 minutes. Remove. Spit out excess. Repeat nightly. 120 mL prn  . sucralfate (CARAFATE) 1 G tablet Dissolve 1 tablet in 10 mL H20 and swallow 30 min prior to meals and bedtime. 60 tablet 5  . tadalafil (CIALIS) 20 MG tablet Take 1 tablet (20 mg total) by mouth daily as needed for erectile dysfunction. (Patient not taking: Reported on 01/21/2015) 30 tablet 3  . tamsulosin (FLOMAX) 0.4 MG CAPS capsule Take 1 capsule (0.4 mg total) by mouth daily. 30 capsule 5  . TRAVATAN Z 0.004 % SOLN ophthalmic solution Place 1 drop into both eyes at bedtime.      No current facility-administered medications for this visit.    PHYSICAL EXAMINATION: ECOG PERFORMANCE STATUS: 1 - Symptomatic but completely ambulatory GENERAL:alert, no distress and comfortable SKIN: Noted radiation-induced skin dermatitis  EYES: normal, Conjunctiva are pink and non-injected, sclera clear OROPHARYNX:no exudate, no erythema and lips, buccal mucosa, and tongue normal . Noted mucositis but no thrush NECK: supple, thyroid normal size, non-tender, without nodularity LYMPH:  Previously palpable lymphadenopathy has regressed in size  LUNGS: clear to auscultation and percussion with normal breathing effort HEART: regular rate & rhythm and no murmurs and no lower extremity edema ABDOMEN:abdomen soft, non-tender and normal bowel sounds Musculoskeletal:no cyanosis of digits and no clubbing  NEURO: alert & oriented x 3 with fluent speech, no focal motor/sensory deficits  LABORATORY DATA:  I have reviewed the data as listed    Component Value Date/Time   NA 139 01/21/2015 1349   NA 141 12/24/2014 0755   K 3.8 01/21/2015 1349   K 3.8 12/24/2014 0755   CL 109 12/24/2014 0755   CO2 29 01/21/2015 1349   CO2 27 12/24/2014 0755   GLUCOSE 134  01/21/2015 1349   GLUCOSE 118* 12/24/2014 0755   BUN 17.3 01/21/2015 1349   BUN 15 12/24/2014 0755   CREATININE 1.1 01/21/2015 1349   CREATININE 0.99 12/24/2014 0755   CALCIUM 9.5 01/21/2015 1349   CALCIUM 8.9 12/24/2014 0755   PROT 7.1 01/21/2015 1349   PROT 6.9 12/24/2014 0755   ALBUMIN 3.2* 01/21/2015 1349   ALBUMIN 4.0 12/24/2014 0755   AST 12 01/21/2015 1349   AST 21 12/24/2014 0755   ALT 14 01/21/2015 1349   ALT 25 12/24/2014 0755   ALKPHOS 86 01/21/2015 1349   ALKPHOS 63 12/24/2014 0755   BILITOT 0.29 01/21/2015 1349   BILITOT 0.7 12/24/2014 0755   GFRNONAA 80* 12/24/2014 0755   GFRAA >90 12/24/2014 0755    No results found for: SPEP, UPEP  Lab Results  Component Value Date   WBC 2.6* 01/21/2015   NEUTROABS 1.3* 01/21/2015   HGB 15.4 01/21/2015   HCT 46.6 01/21/2015   MCV 89.1 01/21/2015   PLT 219 01/21/2015      Chemistry  Component Value Date/Time   NA 139 01/21/2015 1349   NA 141 12/24/2014 0755   K 3.8 01/21/2015 1349   K 3.8 12/24/2014 0755   CL 109 12/24/2014 0755   CO2 29 01/21/2015 1349   CO2 27 12/24/2014 0755   BUN 17.3 01/21/2015 1349   BUN 15 12/24/2014 0755   CREATININE 1.1 01/21/2015 1349   CREATININE 0.99 12/24/2014 0755      Component Value Date/Time   CALCIUM 9.5 01/21/2015 1349   CALCIUM 8.9 12/24/2014 0755   ALKPHOS 86 01/21/2015 1349   ALKPHOS 63 12/24/2014 0755   AST 12 01/21/2015 1349   AST 21 12/24/2014 0755   ALT 14 01/21/2015 1349   ALT 25 12/24/2014 0755   BILITOT 0.29 01/21/2015 1349   BILITOT 0.7 12/24/2014 0755     ASSESSMENT & PLAN:  Cancer of base of tongue He tolerated cycle one poorly due to nausea. With reduced dose chemotherapy for cycle 2 and the addition of intravenous IV dexamethasone for 2 days after treatment, he denies further nausea. He felt that IV fluids daily is very helpful. In the meantime, continue supportive care He is starting to experience multiple side effects from treatment and I will  monitor for them carefully.  I recommend we start IV fluid support for the remainder of his radiation treatment        Mucositis due to chemotherapy He has started to experience side effects from treatment. He was prescribed viscous lidocaine. I recommend also liquid morphine for pain as needed.  I plan to add on fentanyl patch for better pain control      Weight loss He is able to maintain his weight. I will get dietitian to follow-up on his recent weight loss today.    No orders of the defined types were placed in this encounter.   All questions were answered. The patient knows to call the clinic with any problems, questions or concerns. No barriers to learning was detected. I spent 15 minutes counseling the patient face to face. The total time spent in the appointment was 20 minutes and more than 50% was on counseling and review of test results     [facility], Athalene Kolle, MD 01/30/2015 9:08 AM

## 2015-01-31 ENCOUNTER — Ambulatory Visit
Admission: RE | Admit: 2015-01-31 | Discharge: 2015-01-31 | Disposition: A | Discharge status: Home / Self Care | Payer: Medicare Other | Source: Ambulatory Visit | Attending: Radiation Oncology | Admitting: Radiation Oncology

## 2015-01-31 ENCOUNTER — Ambulatory Visit (HOSPITAL_BASED_OUTPATIENT_CLINIC_OR_DEPARTMENT_OTHER): Payer: Medicare Other

## 2015-01-31 VITALS — BP 128/82 | HR 78 | Temp 98.6°F | Resp 18

## 2015-01-31 DIAGNOSIS — R634 Abnormal weight loss: Secondary | ICD-10-CM | POA: Diagnosis not present

## 2015-01-31 DIAGNOSIS — R110 Nausea: Secondary | ICD-10-CM

## 2015-01-31 DIAGNOSIS — C01 Malignant neoplasm of base of tongue: Secondary | ICD-10-CM | POA: Diagnosis not present

## 2015-01-31 DIAGNOSIS — K1231 Oral mucositis (ulcerative) due to antineoplastic therapy: Secondary | ICD-10-CM

## 2015-01-31 DIAGNOSIS — Z510 Encounter for antineoplastic radiation therapy: Secondary | ICD-10-CM | POA: Diagnosis not present

## 2015-01-31 MED ORDER — SODIUM CHLORIDE 0.9 % IJ SOLN
10.0000 mL | INTRAMUSCULAR | Status: DC | PRN
  Administered 2015-01-31: 10 mL
  Filled 2015-01-31: qty 10

## 2015-01-31 MED ORDER — PROMETHAZINE HCL 25 MG/ML IJ SOLN
12.5000 mg | Freq: Once | INTRAMUSCULAR | Status: AC
  Administered 2015-01-31: 12.5 mg via INTRAVENOUS
  Filled 2015-01-31: qty 1

## 2015-01-31 MED ORDER — SODIUM CHLORIDE 0.9 % IV SOLN
Freq: Once | INTRAVENOUS | Status: AC
  Administered 2015-01-31: 14:00:00 via INTRAVENOUS

## 2015-01-31 MED ORDER — HEPARIN SOD (PORK) LOCK FLUSH 100 UNIT/ML IV SOLN
500.0000 [IU] | Freq: Once | INTRAVENOUS | Status: AC | PRN
  Administered 2015-01-31: 500 [IU]
  Filled 2015-01-31: qty 5

## 2015-01-31 NOTE — Patient Instructions (Signed)

## 2015-02-03 ENCOUNTER — Encounter: Payer: Self-pay | Admitting: Radiation Oncology

## 2015-02-03 ENCOUNTER — Ambulatory Visit (HOSPITAL_BASED_OUTPATIENT_CLINIC_OR_DEPARTMENT_OTHER): Payer: Medicare Other

## 2015-02-03 ENCOUNTER — Ambulatory Visit: Payer: Medicare Other | Admitting: Nutrition

## 2015-02-03 ENCOUNTER — Ambulatory Visit
Admission: RE | Admit: 2015-02-03 | Discharge: 2015-02-03 | Disposition: A | Discharge status: Home / Self Care | Payer: Medicare Other | Source: Ambulatory Visit | Attending: Radiation Oncology | Admitting: Radiation Oncology

## 2015-02-03 VITALS — BP 121/65 | HR 79 | Temp 98.9°F | Resp 16

## 2015-02-03 VITALS — BP 88/70 | HR 91 | Temp 98.4°F | Resp 12 | Wt 169.6 lb

## 2015-02-03 DIAGNOSIS — R110 Nausea: Secondary | ICD-10-CM

## 2015-02-03 DIAGNOSIS — Z510 Encounter for antineoplastic radiation therapy: Secondary | ICD-10-CM | POA: Diagnosis not present

## 2015-02-03 DIAGNOSIS — K1231 Oral mucositis (ulcerative) due to antineoplastic therapy: Secondary | ICD-10-CM | POA: Diagnosis not present

## 2015-02-03 DIAGNOSIS — C01 Malignant neoplasm of base of tongue: Secondary | ICD-10-CM | POA: Diagnosis present

## 2015-02-03 DIAGNOSIS — R634 Abnormal weight loss: Secondary | ICD-10-CM | POA: Diagnosis not present

## 2015-02-03 MED ORDER — SODIUM CHLORIDE 0.9 % IV SOLN
1000.0000 mL | Freq: Once | INTRAVENOUS | Status: AC
  Administered 2015-02-03: 1000 mL via INTRAVENOUS

## 2015-02-03 MED ORDER — BIAFINE EX EMUL
Freq: Once | CUTANEOUS | Status: AC
  Administered 2015-02-03: 18:00:00 via TOPICAL

## 2015-02-03 MED ORDER — HEPARIN SOD (PORK) LOCK FLUSH 100 UNIT/ML IV SOLN
500.0000 [IU] | Freq: Once | INTRAVENOUS | Status: AC | PRN
  Administered 2015-02-03: 500 [IU]
  Filled 2015-02-03: qty 5

## 2015-02-03 MED ORDER — SODIUM CHLORIDE 0.9 % IJ SOLN
10.0000 mL | INTRAMUSCULAR | Status: DC | PRN
  Administered 2015-02-03: 10 mL
  Filled 2015-02-03: qty 10

## 2015-02-03 MED ORDER — PROMETHAZINE HCL 25 MG/ML IJ SOLN
12.5000 mg | Freq: Once | INTRAMUSCULAR | Status: AC
  Administered 2015-02-03: 12.5 mg via INTRAVENOUS
  Filled 2015-02-03: qty 1

## 2015-02-03 NOTE — Progress Notes (Signed)
Patient given a refill of biafine and samples of triple antibiotic ointment per Dr. Isidore Moos.

## 2015-02-03 NOTE — Progress Notes (Signed)
Nutrition follow-up completed with patient during IV fluids. Patient describes severe sore throat and difficulty swallowing. He is able to consume one bottle of Ensure Plus by mouth. Patient reports he is unable to eat any soft foods by mouth. Patient consumes 3-4 bottles of Osmolite 1.5 via feeding tube. Weight was documented as 170.5 pounds April 18.  Nutrition diagnosis: Inadequate oral intake continues.  Intervention: Educated patient to increase boost plus or Ensure Plus by mouth to twice a day Encouraged soft moist foods as tolerated. Patient to increase Osmolite 1.5 to a minimum of 4-5 cans daily. Educated on strategies for improving tolerance of bolus tube feeding and oral intake. Teach back method used.  Monitoring, evaluation, goals: Patient will increase tube feedings and oral intake to meet 100% of minimum estimated calorie needs to minimize weight loss.  Next visit: Tuesday, May 3.  **Disclaimer: This note was dictated with voice recognition software. Similar sounding words can inadvertently be transcribed and this note may contain transcription errors which may not have been corrected upon publication of note.**

## 2015-02-03 NOTE — Patient Instructions (Signed)
Dehydration, Adult Dehydration is when you lose more fluids from the body than you take in. Vital organs like the kidneys, brain, and heart cannot function without a proper amount of fluids and salt. Any loss of fluids from the body can cause dehydration.  CAUSES   Vomiting.  Diarrhea.  Excessive sweating.  Excessive urine output.  Fever. SYMPTOMS  Mild dehydration  Thirst.  Dry lips.  Slightly dry mouth. Moderate dehydration  Very dry mouth.  Sunken eyes.  Skin does not bounce back quickly when lightly pinched and released.  Dark urine and decreased urine production.  Decreased tear production.  Headache. Severe dehydration  Very dry mouth.  Extreme thirst.  Rapid, weak pulse (more than 100 beats per minute at rest).  Cold hands and feet.  Not able to sweat in spite of heat and temperature.  Rapid breathing.  Blue lips.  Confusion and lethargy.  Difficulty being awakened.  Minimal urine production.  No tears. DIAGNOSIS  Your caregiver will diagnose dehydration based on your symptoms and your exam. Blood and urine tests will help confirm the diagnosis. The diagnostic evaluation should also identify the cause of dehydration. TREATMENT  Treatment of mild or moderate dehydration can often be done at home by increasing the amount of fluids that you drink. It is best to drink small amounts of fluid more often. Drinking too much at one time can make vomiting worse. Refer to the home care instructions below. Severe dehydration needs to be treated at the hospital where you will probably be given intravenous (IV) fluids that contain water and electrolytes. HOME CARE INSTRUCTIONS   Ask your caregiver about specific rehydration instructions.  Drink enough fluids to keep your urine clear or pale yellow.  Drink small amounts frequently if you have nausea and vomiting.  Eat as you normally do.  Avoid:  Foods or drinks high in sugar.  Carbonated  drinks.  Juice.  Extremely hot or cold fluids.  Drinks with caffeine.  Fatty, greasy foods.  Alcohol.  Tobacco.  Overeating.  Gelatin desserts.  Wash your hands well to avoid spreading bacteria and viruses.  Only take over-the-counter or prescription medicines for pain, discomfort, or fever as directed by your caregiver.  Ask your caregiver if you should continue all prescribed and over-the-counter medicines.  Keep all follow-up appointments with your caregiver. SEEK MEDICAL CARE IF:  You have abdominal pain and it increases or stays in one area (localizes).  You have a rash, stiff neck, or severe headache.  You are irritable, sleepy, or difficult to awaken.  You are weak, dizzy, or extremely thirsty. SEEK IMMEDIATE MEDICAL CARE IF:   You are unable to keep fluids down or you get worse despite treatment.  You have frequent episodes of vomiting or diarrhea.  You have blood or green matter (bile) in your vomit.  You have blood in your stool or your stool looks black and tarry.  You have not urinated in 6 to 8 hours, or you have only urinated a small amount of very dark urine.  You have a fever.  You faint. MAKE SURE YOU:   Understand these instructions.  Will watch your condition.  Will get help right away if you are not doing well or get worse. Document Released: 09/27/2005 Document Revised: 12/20/2011 Document Reviewed: 05/17/2011 ExitCare Patient Information 2015 [employer]. This information is not intended to replace advice given to you by your health care provider. Make sure you discuss any questions you have with your health care   provider.  

## 2015-02-03 NOTE — Progress Notes (Signed)
Weekly Management Note:  Outpatient    ICD-9-CM ICD-10-CM   1. Cancer of base of tongue 141.0 C01 topical emolient (BIAFINE) emulsion    Current Dose:  48 Gy  Projected Dose: 70 Gy   Narrative:  The patient presents for routine under treatment assessment.  CBCT/MVCT images/Port film x-rays were reviewed.  The chart was checked.  Pt notes general weakness. One occurrence of vomiting experienced in past week. Pain well tolerated. Pt notes continued use of radiaplex. Has some Constipation. Receiving IVF   BP 88/70 mmHg  Pulse 91  Temp(Src) 98.4 F (36.9 C) (Oral)  Resp 12  Wt 169 lb 9.6 oz (76.93 kg)  SpO2 99% Physical Findings: oropharynx is erythematous and dry w/ no thrush; + neck desquamation and hyperpigmentation  Wt Readings from Last 3 Encounters:  02/03/15 169 lb 9.6 oz (76.93 kg)  01/21/15 168 lb 12.8 oz (76.567 kg)  01/20/15 169 lb 9.6 oz (76.93 kg)   Vitals with Age-Percentiles 02/03/2015 02/03/2015  Length    Systolic 88 749  Diastolic 70 65  Pulse 91 84  Respiration  12  Weight  76.93 kg  BMI       CBC    Component Value Date/Time   WBC 2.6* 01/21/2015 1349   WBC 7.0 12/24/2014 0755   RBC 5.23 01/21/2015 1349   RBC 5.19 12/24/2014 0755   HGB 15.4 01/21/2015 1349   HGB 15.3 12/24/2014 0755   HCT 46.6 01/21/2015 1349   HCT 46.5 12/24/2014 0755   PLT 219 01/21/2015 1349   PLT 189 12/24/2014 0755   MCV 89.1 01/21/2015 1349   MCV 89.6 12/24/2014 0755   MCH 29.4 01/21/2015 1349   MCH 29.5 12/24/2014 0755   MCHC 33.0 01/21/2015 1349   MCHC 32.9 12/24/2014 0755   RDW 12.8 01/21/2015 1349   RDW 13.8 12/24/2014 0755   LYMPHSABS 0.5* 01/21/2015 1349   LYMPHSABS 1.7 12/24/2014 0755   MONOABS 0.7 01/21/2015 1349   MONOABS 0.7 12/24/2014 0755   EOSABS 0.0 01/21/2015 1349   EOSABS 0.1 12/24/2014 0755   BASOSABS 0.0 01/21/2015 1349   BASOSABS 0.1 12/24/2014 0755     CMP     Component Value Date/Time   NA 139 01/21/2015 1349   NA 141 12/24/2014 0755    K 3.8 01/21/2015 1349   K 3.8 12/24/2014 0755   CL 109 12/24/2014 0755   CO2 29 01/21/2015 1349   CO2 27 12/24/2014 0755   GLUCOSE 134 01/21/2015 1349   GLUCOSE 118* 12/24/2014 0755   BUN 17.3 01/21/2015 1349   BUN 15 12/24/2014 0755   CREATININE 1.1 01/21/2015 1349   CREATININE 0.99 12/24/2014 0755   CALCIUM 9.5 01/21/2015 1349   CALCIUM 8.9 12/24/2014 0755   PROT 7.1 01/21/2015 1349   PROT 6.9 12/24/2014 0755   ALBUMIN 3.2* 01/21/2015 1349   ALBUMIN 4.0 12/24/2014 0755   AST 12 01/21/2015 1349   AST 21 12/24/2014 0755   ALT 14 01/21/2015 1349   ALT 25 12/24/2014 0755   ALKPHOS 86 01/21/2015 1349   ALKPHOS 63 12/24/2014 0755   BILITOT 0.29 01/21/2015 1349   BILITOT 0.7 12/24/2014 0755   GFRNONAA 80* 12/24/2014 0755   GFRAA >90 12/24/2014 0755     Impression:  The patient is tolerating radiotherapy.   Plan:  Continue radiotherapy as planned. Recommended Miralax for constipation. Offered mentors  --- pt declines now... Gave Neosporin packets and Biafine to be used on treatment area.     -----------------------------------  Eppie Gibson, MD  This document serves as a record of services personally performed by Eppie Gibson, MD. It was created on her behalf by Derek Mound, a trained medical scribe. The creation of this record is based on the scribe's personal observations and the provider's statements to them. This document has been checked and approved by the attending provider.

## 2015-02-03 NOTE — Progress Notes (Signed)
He rates his pain as a 2 on a scale of 0-10. intermittent, burning and stinging over throat  Pt complains of fatigue, weakness and poor appetite.  Pt has had dysphagia for both solids and liquids. Pt reports a Fluids by mouth, no solid foods (One Ensure daily) Formula-Osmolite 1.5, 3-4 cans per day, 159ml for times a day H20 flush. Oral exam reveals mild erythema with clear and tenacious sputum. Skin exam reveals Dryness, Hyperpigmentation, Pruritus, dry desquamation and erythema, tender.  Continues to apply Biafine bid-tid. Pt reports Nausea, Vomiting and Constipation- has constipation handout hasn't had a bowel movement in 2 days. Will initiate bowel routine tonight.  Wt Readings from Last 3 Encounters:  02/03/15 169 lb 9.6 oz (76.93 kg)  01/21/15 168 lb 12.8 oz (76.567 kg)  01/20/15 169 lb 9.6 oz (76.93 kg)  BP 88/70 mmHg  Pulse 91  Temp(Src) 98.4 F (36.9 C) (Oral)  Resp 12  Wt 169 lb 9.6 oz (76.93 kg)  SpO2 99% Orthostatic Standing Vital Signs: BP:88/70 P:91 Pox:99

## 2015-02-04 ENCOUNTER — Ambulatory Visit
Admission: RE | Admit: 2015-02-04 | Discharge: 2015-02-04 | Disposition: A | Discharge status: Home / Self Care | Payer: Medicare Other | Source: Ambulatory Visit | Attending: Radiation Oncology | Admitting: Radiation Oncology

## 2015-02-04 ENCOUNTER — Ambulatory Visit (HOSPITAL_BASED_OUTPATIENT_CLINIC_OR_DEPARTMENT_OTHER): Payer: Medicare Other

## 2015-02-04 VITALS — BP 120/67 | HR 81 | Temp 98.2°F | Resp 19

## 2015-02-04 DIAGNOSIS — C01 Malignant neoplasm of base of tongue: Secondary | ICD-10-CM

## 2015-02-04 DIAGNOSIS — R634 Abnormal weight loss: Secondary | ICD-10-CM

## 2015-02-04 DIAGNOSIS — R110 Nausea: Secondary | ICD-10-CM

## 2015-02-04 DIAGNOSIS — Z510 Encounter for antineoplastic radiation therapy: Secondary | ICD-10-CM | POA: Diagnosis not present

## 2015-02-04 DIAGNOSIS — K1231 Oral mucositis (ulcerative) due to antineoplastic therapy: Secondary | ICD-10-CM | POA: Diagnosis not present

## 2015-02-04 MED ORDER — HEPARIN SOD (PORK) LOCK FLUSH 100 UNIT/ML IV SOLN
500.0000 [IU] | Freq: Once | INTRAVENOUS | Status: AC | PRN
  Administered 2015-02-04: 500 [IU]
  Filled 2015-02-04: qty 5

## 2015-02-04 MED ORDER — SODIUM CHLORIDE 0.9 % IV SOLN
Freq: Once | INTRAVENOUS | Status: AC
  Administered 2015-02-04: 15:00:00 via INTRAVENOUS

## 2015-02-04 MED ORDER — SODIUM CHLORIDE 0.9 % IJ SOLN
10.0000 mL | INTRAMUSCULAR | Status: DC | PRN
  Administered 2015-02-04: 10 mL
  Filled 2015-02-04: qty 10

## 2015-02-04 MED ORDER — PROMETHAZINE HCL 25 MG/ML IJ SOLN
12.5000 mg | Freq: Once | INTRAMUSCULAR | Status: AC
  Administered 2015-02-04: 12.5 mg via INTRAVENOUS
  Filled 2015-02-04: qty 1

## 2015-02-04 NOTE — Patient Instructions (Signed)

## 2015-02-05 ENCOUNTER — Ambulatory Visit (HOSPITAL_BASED_OUTPATIENT_CLINIC_OR_DEPARTMENT_OTHER): Payer: Medicare Other

## 2015-02-05 ENCOUNTER — Ambulatory Visit (HOSPITAL_BASED_OUTPATIENT_CLINIC_OR_DEPARTMENT_OTHER): Payer: Medicare Other | Admitting: Hematology and Oncology

## 2015-02-05 ENCOUNTER — Ambulatory Visit: Payer: Self-pay

## 2015-02-05 ENCOUNTER — Ambulatory Visit
Admission: RE | Admit: 2015-02-05 | Discharge: 2015-02-05 | Disposition: A | Discharge status: Home / Self Care | Payer: Medicare Other | Source: Ambulatory Visit | Attending: Radiation Oncology | Admitting: Radiation Oncology

## 2015-02-05 VITALS — BP 108/63 | HR 78 | Temp 98.3°F | Resp 18

## 2015-02-05 DIAGNOSIS — T451X5A Adverse effect of antineoplastic and immunosuppressive drugs, initial encounter: Secondary | ICD-10-CM

## 2015-02-05 DIAGNOSIS — R112 Nausea with vomiting, unspecified: Secondary | ICD-10-CM

## 2015-02-05 DIAGNOSIS — K1231 Oral mucositis (ulcerative) due to antineoplastic therapy: Secondary | ICD-10-CM

## 2015-02-05 DIAGNOSIS — Z510 Encounter for antineoplastic radiation therapy: Secondary | ICD-10-CM | POA: Diagnosis not present

## 2015-02-05 DIAGNOSIS — R634 Abnormal weight loss: Secondary | ICD-10-CM

## 2015-02-05 DIAGNOSIS — C01 Malignant neoplasm of base of tongue: Secondary | ICD-10-CM | POA: Diagnosis not present

## 2015-02-05 DIAGNOSIS — R110 Nausea: Secondary | ICD-10-CM

## 2015-02-05 MED ORDER — HYDROMORPHONE HCL 4 MG/ML IJ SOLN
INTRAMUSCULAR | Status: AC
  Filled 2015-02-05: qty 1

## 2015-02-05 MED ORDER — FENTANYL 50 MCG/HR TD PT72: 50.0000 ug | MEDICATED_PATCH | TRANSDERMAL | Status: DC

## 2015-02-05 MED ORDER — HYDROMORPHONE HCL 1 MG/ML IJ SOLN
2.0000 mg | INTRAMUSCULAR | Status: DC | PRN
Start: 2015-02-05 — End: 2015-02-05
  Administered 2015-02-05: 2 mg via INTRAVENOUS
  Filled 2015-02-05: qty 2

## 2015-02-05 MED ORDER — ALTEPLASE 2 MG IJ SOLR
2.0000 mg | Freq: Once | INTRAMUSCULAR | Status: DC | PRN
  Filled 2015-02-05: qty 2

## 2015-02-05 MED ORDER — LACTULOSE 10 GM/15ML PO SOLN: 10.0000 g | Freq: Three times a day (TID) | ORAL | Status: DC

## 2015-02-05 MED ORDER — SODIUM CHLORIDE 0.9 % IJ SOLN
10.0000 mL | INTRAMUSCULAR | Status: DC | PRN
  Administered 2015-02-05: 10 mL
  Filled 2015-02-05: qty 10

## 2015-02-05 MED ORDER — HEPARIN SOD (PORK) LOCK FLUSH 100 UNIT/ML IV SOLN
250.0000 [IU] | Freq: Once | INTRAVENOUS | Status: DC | PRN
  Filled 2015-02-05: qty 5

## 2015-02-05 MED ORDER — SODIUM CHLORIDE 0.9 % IV SOLN
Freq: Once | INTRAVENOUS | Status: AC
  Administered 2015-02-05: 14:00:00 via INTRAVENOUS

## 2015-02-05 MED ORDER — PROMETHAZINE HCL 25 MG/ML IJ SOLN
12.5000 mg | Freq: Once | INTRAMUSCULAR | Status: AC
  Administered 2015-02-05: 12.5 mg via INTRAVENOUS
  Filled 2015-02-05: qty 1

## 2015-02-05 MED ORDER — HEPARIN SOD (PORK) LOCK FLUSH 100 UNIT/ML IV SOLN
500.0000 [IU] | Freq: Once | INTRAVENOUS | Status: AC | PRN
  Administered 2015-02-05: 500 [IU]
  Filled 2015-02-05: qty 5

## 2015-02-05 MED ORDER — MORPHINE SULFATE (CONCENTRATE) 20 MG/ML PO SOLN: 20.0000 mg | ORAL | Status: DC | PRN

## 2015-02-05 NOTE — Progress Notes (Signed)
1515 Pt escorted to radiation by rad tech Scott - ambulatory with pump.

## 2015-02-05 NOTE — Patient Instructions (Signed)
Dehydration, Adult Dehydration is when you lose more fluids from the body than you take in. Vital organs like the kidneys, brain, and heart cannot function without a proper amount of fluids and salt. Any loss of fluids from the body can cause dehydration.  CAUSES   Vomiting.  Diarrhea.  Excessive sweating.  Excessive urine output.  Fever. SYMPTOMS  Mild dehydration  Thirst.  Dry lips.  Slightly dry mouth. Moderate dehydration  Very dry mouth.  Sunken eyes.  Skin does not bounce back quickly when lightly pinched and released.  Dark urine and decreased urine production.  Decreased tear production.  Headache. Severe dehydration  Very dry mouth.  Extreme thirst.  Rapid, weak pulse (more than 100 beats per minute at rest).  Cold hands and feet.  Not able to sweat in spite of heat and temperature.  Rapid breathing.  Blue lips.  Confusion and lethargy.  Difficulty being awakened.  Minimal urine production.  No tears. DIAGNOSIS  Your caregiver will diagnose dehydration based on your symptoms and your exam. Blood and urine tests will help confirm the diagnosis. The diagnostic evaluation should also identify the cause of dehydration. TREATMENT  Treatment of mild or moderate dehydration can often be done at home by increasing the amount of fluids that you drink. It is best to drink small amounts of fluid more often. Drinking too much at one time can make vomiting worse. Refer to the home care instructions below. Severe dehydration needs to be treated at the hospital where you will probably be given intravenous (IV) fluids that contain water and electrolytes. HOME CARE INSTRUCTIONS   Ask your caregiver about specific rehydration instructions.  Drink enough fluids to keep your urine clear or pale yellow.  Drink small amounts frequently if you have nausea and vomiting.  Eat as you normally do.  Avoid:  Foods or drinks high in sugar.  Carbonated  drinks.  Juice.  Extremely hot or cold fluids.  Drinks with caffeine.  Fatty, greasy foods.  Alcohol.  Tobacco.  Overeating.  Gelatin desserts.  Wash your hands well to avoid spreading bacteria and viruses.  Only take over-the-counter or prescription medicines for pain, discomfort, or fever as directed by your caregiver.  Ask your caregiver if you should continue all prescribed and over-the-counter medicines.  Keep all follow-up appointments with your caregiver. SEEK MEDICAL CARE IF:  You have abdominal pain and it increases or stays in one area (localizes).  You have a rash, stiff neck, or severe headache.  You are irritable, sleepy, or difficult to awaken.  You are weak, dizzy, or extremely thirsty. SEEK IMMEDIATE MEDICAL CARE IF:   You are unable to keep fluids down or you get worse despite treatment.  You have frequent episodes of vomiting or diarrhea.  You have blood or green matter (bile) in your vomit.  You have blood in your stool or your stool looks black and tarry.  You have not urinated in 6 to 8 hours, or you have only urinated a small amount of very dark urine.  You have a fever.  You faint. MAKE SURE YOU:   Understand these instructions.  Will watch your condition.  Will get help right away if you are not doing well or get worse. Document Released: 09/27/2005 Document Revised: 12/20/2011 Document Reviewed: 05/17/2011 ExitCare Patient Information 2015 [employer]. This information is not intended to replace advice given to you by your health care provider. Make sure you discuss any questions you have with your health care   provider.  

## 2015-02-06 ENCOUNTER — Encounter: Payer: Self-pay | Admitting: *Deleted

## 2015-02-06 ENCOUNTER — Ambulatory Visit
Admission: RE | Admit: 2015-02-06 | Discharge: 2015-02-06 | Disposition: A | Discharge status: Home / Self Care | Payer: Medicare Other | Source: Ambulatory Visit | Attending: Radiation Oncology | Admitting: Radiation Oncology

## 2015-02-06 ENCOUNTER — Ambulatory Visit (HOSPITAL_BASED_OUTPATIENT_CLINIC_OR_DEPARTMENT_OTHER): Payer: Medicare Other

## 2015-02-06 VITALS — BP 115/73 | HR 76 | Temp 98.7°F | Resp 20

## 2015-02-06 DIAGNOSIS — Z510 Encounter for antineoplastic radiation therapy: Secondary | ICD-10-CM | POA: Diagnosis not present

## 2015-02-06 DIAGNOSIS — C01 Malignant neoplasm of base of tongue: Secondary | ICD-10-CM

## 2015-02-06 DIAGNOSIS — R112 Nausea with vomiting, unspecified: Secondary | ICD-10-CM | POA: Diagnosis not present

## 2015-02-06 DIAGNOSIS — K1231 Oral mucositis (ulcerative) due to antineoplastic therapy: Secondary | ICD-10-CM

## 2015-02-06 DIAGNOSIS — R110 Nausea: Secondary | ICD-10-CM

## 2015-02-06 MED ORDER — PROMETHAZINE HCL 25 MG/ML IJ SOLN
12.5000 mg | Freq: Once | INTRAMUSCULAR | Status: AC
  Administered 2015-02-06: 12.5 mg via INTRAVENOUS
  Filled 2015-02-06: qty 1

## 2015-02-06 MED ORDER — SODIUM CHLORIDE 0.9 % IV SOLN
Freq: Once | INTRAVENOUS | Status: AC
  Administered 2015-02-06: 15:00:00 via INTRAVENOUS

## 2015-02-06 MED ORDER — HYDROMORPHONE HCL 1 MG/ML IJ SOLN
2.0000 mg | INTRAMUSCULAR | Status: DC | PRN
  Filled 2015-02-06: qty 2

## 2015-02-06 MED ORDER — SODIUM CHLORIDE 0.9 % IJ SOLN
10.0000 mL | INTRAMUSCULAR | Status: DC | PRN
  Administered 2015-02-06: 10 mL
  Filled 2015-02-06: qty 10

## 2015-02-06 MED ORDER — HYDROMORPHONE HCL 4 MG/ML IJ SOLN
INTRAMUSCULAR | Status: AC
  Filled 2015-02-06: qty 1

## 2015-02-06 MED ORDER — HEPARIN SOD (PORK) LOCK FLUSH 100 UNIT/ML IV SOLN
500.0000 [IU] | Freq: Once | INTRAVENOUS | Status: AC | PRN
  Administered 2015-02-06: 500 [IU]
  Filled 2015-02-06: qty 5

## 2015-02-06 NOTE — Assessment & Plan Note (Signed)
He tolerated cycle one poorly due to nausea. He received reduced dose chemotherapy for cycle 2 and the addition of intravenous IV dexamethasone for 2 days after treatment. He tolerated that well.  He felt that IV fluids daily is very helpful. In the meantime, continue supportive care He is starting to experience multiple side effects from treatment and I will monitor for them carefully.  I recommend we start IV fluid support for the remainder of his radiation treatment

## 2015-02-06 NOTE — Assessment & Plan Note (Signed)
He is barely able to maintain his weight. I will get dietitian to follow-up on his recent weight loss today. I recommend he increase nutritional supplements to minimum 5 cans per day.

## 2015-02-06 NOTE — Assessment & Plan Note (Signed)
He has started to experience side effects from treatment. He was prescribed viscous lidocaine. I plan to increase the dose of liquid morphine for pain as needed and to increase the dose of fentanyl patch for better pain control

## 2015-02-06 NOTE — Assessment & Plan Note (Signed)
I recommend addition of IV fluids daily along with IV anti-emetics. I will also add IV dexamethasone for 2 days after treatment along with reduce dose cisplatin

## 2015-02-06 NOTE — Progress Notes (Signed)
[place] OFFICE PROGRESS NOTE  Patient Care Team: Laurey Morale, MD as PCP - General (Family Medicine) Heath Lark, MD as Consulting Physician (Hematology and Oncology)  SUMMARY OF ONCOLOGIC HISTORY: Oncology History   Cancer of base of tongue   Staging form: Lip and Oral Cavity, AJCC 7th Edition     Clinical stage from 12/03/2014: Stage IVA (T2, N2a, M0) - Signed by Heath Lark, MD on 12/13/2014 HPV negative           Cancer of base of tongue   11/20/2014 Pathology Results Accession: [id number] confirm squamous cell carcinoma.   11/20/2014 Procedure The patient underwent fine-needle aspirate of the right lymph node   12/02/2014 Imaging CT scan showed 2.2 cm the right tongue base mass with necrotic 4 cm right level II nodal metastasis with multiple bilateral small lymphadenopathy   12/10/2014 Imaging PET CT scan showed tongue base mass with right level II lymph node metastasis   12/24/2014 Procedure He has placement of port and feeding tube   01/01/2015 -  Chemotherapy He received high dose cisplatin   01/01/2015 -  Radiation Therapy He received concurrent radiation treatment   01/21/2015 Adverse Reaction  cycle 2 chemotherapy dose will be reduced by 25% due to intolerable side effects    INTERVAL HISTORY: Please see below for problem oriented charting. He is seen today for further follow-up. His pain in the mild is getting a little worse. He denies further nausea or vomiting. He feels that IV fluids is very helpful. He had mild constipation.  REVIEW OF SYSTEMS:   Constitutional: Denies fevers, chills or abnormal weight loss Eyes: Denies blurriness of vision Respiratory: Denies cough, dyspnea or wheezes Cardiovascular: Denies palpitation, chest discomfort or lower extremity swelling Gastrointestinal:  Denies nausea, heartburn or change in bowel habits Lymphatics: Denies new lymphadenopathy or easy bruising Neurological:Denies numbness, tingling or new  weaknesses Behavioral/Psych: Mood is stable, no new changes  All other systems were reviewed with the patient and are negative.  I have reviewed the past medical history, past surgical history, social history and family history with the patient and they are unchanged from previous note.  ALLERGIES:  has No Known Allergies.  MEDICATIONS:  Current Outpatient Prescriptions  Medication Sig Dispense Refill  . ASPIRIN PO Take 81 mg by mouth every morning.     . brimonidine (ALPHAGAN) 0.15 % ophthalmic solution   2  . carvedilol (COREG) 25 MG tablet Take 1 tablet (25 mg total) by mouth 2 (two) times daily with a meal. 180 tablet 3  . emollient (BIAFINE) cream Apply topically as needed.    . fentaNYL (DURAGESIC - DOSED MCG/HR) 50 MCG/HR Place 1 patch (50 mcg total) onto the skin every 3 (three) days. 5 patch 0  . fluconazole (DIFLUCAN) 100 MG tablet Take 2 tablets today, then 1 tablet daily x 20 more days. 22 tablet 0  . hyaluronate sodium (RADIAPLEXRX) GEL Apply 1 application topically 2 (two) times daily.    Marland Kitchen lactulose (CHRONULAC) 10 GM/15ML solution Place 15 mLs (10 g total) into feeding tube 3 (three) times daily. 240 mL 6  . lidocaine (XYLOCAINE) 2 % solution Patient: Mix 1part 2% viscous lidocaine, 1part H20. Swish and/or swallow 25mL of this mixture, 50min before meals and at bedtime, up to QID 100 mL 5  . lidocaine-prilocaine (EMLA) cream Apply to affected area once 30 g 3  . LORazepam (ATIVAN) 0.5 MG tablet Take 1 tab up to TID PRN anxiety, nausea, or claustrophobia; take  20 minutes before wearing radiotherapy mask. 60 tablet 0  . morphine (ROXANOL) 20 MG/ML concentrated solution Take 1 mL (20 mg total) by mouth every 2 (two) hours as needed for severe pain. 240 mL 0  . multivitamin (THERAGRAN) per tablet Take 1 tablet by mouth every morning.     . ondansetron (ZOFRAN) 8 MG tablet Take 1 tablet (8 mg total) by mouth every 8 (eight) hours as needed. 30 tablet 1  . prochlorperazine  (COMPAZINE) 10 MG tablet Take 1 tablet (10 mg total) by mouth every 6 (six) hours as needed (Nausea or vomiting). 30 tablet 1  . simvastatin (ZOCOR) 40 MG tablet Take 1 tablet (40 mg total) by mouth at bedtime. 90 tablet 3  . sodium fluoride (FLUORISHIELD) 1.1 % GEL dental gel Instill one drop of gel per tooth space of fluoride tray. Place over teeth for 5 minutes. Remove. Spit out excess. Repeat nightly. 120 mL prn  . sucralfate (CARAFATE) 1 G tablet Dissolve 1 tablet in 10 mL H20 and swallow 30 min prior to meals and bedtime. 60 tablet 5  . tadalafil (CIALIS) 20 MG tablet Take 1 tablet (20 mg total) by mouth daily as needed for erectile dysfunction. (Patient not taking: Reported on 01/21/2015) 30 tablet 3  . tamsulosin (FLOMAX) 0.4 MG CAPS capsule Take 1 capsule (0.4 mg total) by mouth daily. 30 capsule 5  . TRAVATAN Z 0.004 % SOLN ophthalmic solution Place 1 drop into both eyes at bedtime.      No current facility-administered medications for this visit.    PHYSICAL EXAMINATION: ECOG PERFORMANCE STATUS: 1 - Symptomatic but completely ambulatory GENERAL:alert, no distress and comfortable SKIN: Significant radiation dermatitis with minimal ulceration along his neck. EYES: normal, Conjunctiva are pink and non-injected, sclera clear OROPHARYNX:no exudate, no erythema and lips, buccal mucosa, and tongue normal . Noted mucositis and dry mucous membranes without thrush NECK: supple, thyroid normal size, non-tender, without nodularity LYMPH:  Previous palpable lymphadenopathy are improving.  LUNGS: clear to auscultation and percussion with normal breathing effort HEART: regular rate & rhythm and no murmurs and no lower extremity edema ABDOMEN:abdomen soft, non-tender and normal bowel sounds. Feeding tube site looks okay Musculoskeletal:no cyanosis of digits and no clubbing  NEURO: alert & oriented x 3 with fluent speech, no focal motor/sensory deficits  LABORATORY DATA:  I have reviewed the data  as listed    Component Value Date/Time   NA 139 01/21/2015 1349   NA 141 12/24/2014 0755   K 3.8 01/21/2015 1349   K 3.8 12/24/2014 0755   CL 109 12/24/2014 0755   CO2 29 01/21/2015 1349   CO2 27 12/24/2014 0755   GLUCOSE 134 01/21/2015 1349   GLUCOSE 118* 12/24/2014 0755   BUN 17.3 01/21/2015 1349   BUN 15 12/24/2014 0755   CREATININE 1.1 01/21/2015 1349   CREATININE 0.99 12/24/2014 0755   CALCIUM 9.5 01/21/2015 1349   CALCIUM 8.9 12/24/2014 0755   PROT 7.1 01/21/2015 1349   PROT 6.9 12/24/2014 0755   ALBUMIN 3.2* 01/21/2015 1349   ALBUMIN 4.0 12/24/2014 0755   AST 12 01/21/2015 1349   AST 21 12/24/2014 0755   ALT 14 01/21/2015 1349   ALT 25 12/24/2014 0755   ALKPHOS 86 01/21/2015 1349   ALKPHOS 63 12/24/2014 0755   BILITOT 0.29 01/21/2015 1349   BILITOT 0.7 12/24/2014 0755   GFRNONAA 80* 12/24/2014 0755   GFRAA >90 12/24/2014 0755    No results found for: SPEP, UPEP  Lab Results  Component Value Date   WBC 2.6* 01/21/2015   NEUTROABS 1.3* 01/21/2015   HGB 15.4 01/21/2015   HCT 46.6 01/21/2015   MCV 89.1 01/21/2015   PLT 219 01/21/2015      Chemistry      Component Value Date/Time   NA 139 01/21/2015 1349   NA 141 12/24/2014 0755   K 3.8 01/21/2015 1349   K 3.8 12/24/2014 0755   CL 109 12/24/2014 0755   CO2 29 01/21/2015 1349   CO2 27 12/24/2014 0755   BUN 17.3 01/21/2015 1349   BUN 15 12/24/2014 0755   CREATININE 1.1 01/21/2015 1349   CREATININE 0.99 12/24/2014 0755      Component Value Date/Time   CALCIUM 9.5 01/21/2015 1349   CALCIUM 8.9 12/24/2014 0755   ALKPHOS 86 01/21/2015 1349   ALKPHOS 63 12/24/2014 0755   AST 12 01/21/2015 1349   AST 21 12/24/2014 0755   ALT 14 01/21/2015 1349   ALT 25 12/24/2014 0755   BILITOT 0.29 01/21/2015 1349   BILITOT 0.7 12/24/2014 0755      ASSESSMENT & PLAN:  Cancer of base of tongue He tolerated cycle one poorly due to nausea. He received reduced dose chemotherapy for cycle 2 and the addition of  intravenous IV dexamethasone for 2 days after treatment. He tolerated that well.  He felt that IV fluids daily is very helpful. In the meantime, continue supportive care He is starting to experience multiple side effects from treatment and I will monitor for them carefully.  I recommend we start IV fluid support for the remainder of his radiation treatment   Mucositis due to chemotherapy He has started to experience side effects from treatment. He was prescribed viscous lidocaine. I plan to increase the dose of liquid morphine for pain as needed and to increase the dose of fentanyl patch for better pain control      Chemotherapy induced nausea and vomiting I recommend addition of IV fluids daily along with IV anti-emetics. I will also add IV dexamethasone for 2 days after treatment along with reduce dose cisplatin   Weight loss He is barely able to maintain his weight. I will get dietitian to follow-up on his recent weight loss today. I recommend he increase nutritional supplements to minimum 5 cans per day.    No orders of the defined types were placed in this encounter.   All questions were answered. The patient knows to call the clinic with any problems, questions or concerns. No barriers to learning was detected. I spent 30 minutes counseling the patient face to face. The total time spent in the appointment was 40 minutes and more than 50% was on counseling and review of test results     [facility], [place], MD 02/06/2015 12:50 PM

## 2015-02-07 ENCOUNTER — Ambulatory Visit
Admission: RE | Admit: 2015-02-07 | Discharge: 2015-02-07 | Disposition: A | Discharge status: Home / Self Care | Payer: Medicare Other | Source: Ambulatory Visit | Attending: Radiation Oncology | Admitting: Radiation Oncology

## 2015-02-07 ENCOUNTER — Ambulatory Visit (HOSPITAL_BASED_OUTPATIENT_CLINIC_OR_DEPARTMENT_OTHER): Payer: Medicare Other

## 2015-02-07 VITALS — BP 99/66 | HR 94 | Temp 99.2°F

## 2015-02-07 DIAGNOSIS — R112 Nausea with vomiting, unspecified: Secondary | ICD-10-CM | POA: Diagnosis not present

## 2015-02-07 DIAGNOSIS — C01 Malignant neoplasm of base of tongue: Secondary | ICD-10-CM

## 2015-02-07 DIAGNOSIS — Z510 Encounter for antineoplastic radiation therapy: Secondary | ICD-10-CM | POA: Diagnosis not present

## 2015-02-07 DIAGNOSIS — K1231 Oral mucositis (ulcerative) due to antineoplastic therapy: Secondary | ICD-10-CM

## 2015-02-07 DIAGNOSIS — R110 Nausea: Secondary | ICD-10-CM

## 2015-02-07 MED ORDER — HYDROMORPHONE HCL 1 MG/ML IJ SOLN
2.0000 mg | INTRAMUSCULAR | Status: DC | PRN
  Administered 2015-02-07: 2 mg via INTRAVENOUS
  Filled 2015-02-07: qty 2

## 2015-02-07 MED ORDER — PROMETHAZINE HCL 25 MG/ML IJ SOLN
12.5000 mg | Freq: Once | INTRAMUSCULAR | Status: AC
  Administered 2015-02-07: 12.5 mg via INTRAVENOUS
  Filled 2015-02-07: qty 1

## 2015-02-07 MED ORDER — SODIUM CHLORIDE 0.9 % IJ SOLN
10.0000 mL | INTRAMUSCULAR | Status: DC | PRN
  Administered 2015-02-07: 10 mL
  Filled 2015-02-07: qty 10

## 2015-02-07 MED ORDER — HYDROMORPHONE HCL 4 MG/ML IJ SOLN
INTRAMUSCULAR | Status: AC
  Filled 2015-02-07: qty 1

## 2015-02-07 MED ORDER — HEPARIN SOD (PORK) LOCK FLUSH 100 UNIT/ML IV SOLN
500.0000 [IU] | Freq: Once | INTRAVENOUS | Status: AC | PRN
  Administered 2015-02-07: 500 [IU]
  Filled 2015-02-07: qty 5

## 2015-02-07 MED ORDER — SODIUM CHLORIDE 0.9 % IV SOLN
Freq: Once | INTRAVENOUS | Status: AC
  Administered 2015-02-07: 14:00:00 via INTRAVENOUS

## 2015-02-07 MED ORDER — SODIUM CHLORIDE 0.9 % IV SOLN: 10.0000 mg | Freq: Once | INTRAVENOUS | Status: DC

## 2015-02-07 NOTE — Progress Notes (Signed)
To provide support and encouragement, care continuity and to assess for needs, met with patient in Infusion.  We were later joined by his wife. 1. He reported:  BID application of Biafine.  Motivation to drink at least 1 bottle of water by mouth daily despite throat soreness with swallowing.  I encourage him to use Lidocaine rinse to ease throat discomfort.  Instillation of 4 cans daily nutritional supplement via PEG, including numerous free-water boluses.  Weight maintenance.  Increased fatigue, going to bed early in the evening.  Confidence he is going to be able to finish RT, "I'm taking it a day at a time." 2. He asked if IVF will continue to be scheduled as he feels there is benefit.  I explained that Dr. Alvy Bimler will continually evaluate the need. 3. They understand I can be contacted with needs/concerns.  Gayleen Orem, RN, BSN, [place] [facility] [place] [phone]

## 2015-02-07 NOTE — Patient Instructions (Signed)

## 2015-02-08 ENCOUNTER — Ambulatory Visit (HOSPITAL_BASED_OUTPATIENT_CLINIC_OR_DEPARTMENT_OTHER): Payer: Medicare Other

## 2015-02-08 VITALS — BP 132/78 | HR 66 | Temp 99.3°F | Resp 16

## 2015-02-08 DIAGNOSIS — R110 Nausea: Secondary | ICD-10-CM

## 2015-02-08 DIAGNOSIS — C01 Malignant neoplasm of base of tongue: Secondary | ICD-10-CM

## 2015-02-08 DIAGNOSIS — K1231 Oral mucositis (ulcerative) due to antineoplastic therapy: Secondary | ICD-10-CM | POA: Diagnosis not present

## 2015-02-08 MED ORDER — HEPARIN SOD (PORK) LOCK FLUSH 100 UNIT/ML IV SOLN
500.0000 [IU] | Freq: Once | INTRAVENOUS | Status: AC | PRN
  Administered 2015-02-08: 500 [IU]
  Filled 2015-02-08: qty 5

## 2015-02-08 MED ORDER — HYDROMORPHONE HCL 4 MG/ML IJ SOLN
INTRAMUSCULAR | Status: AC
  Filled 2015-02-08: qty 1

## 2015-02-08 MED ORDER — SODIUM CHLORIDE 0.9 % IV SOLN
Freq: Once | INTRAVENOUS | Status: AC
  Administered 2015-02-08: 09:00:00 via INTRAVENOUS

## 2015-02-08 MED ORDER — SODIUM CHLORIDE 0.9 % IJ SOLN
10.0000 mL | INTRAMUSCULAR | Status: DC | PRN
  Administered 2015-02-08: 10 mL
  Filled 2015-02-08: qty 10

## 2015-02-08 MED ORDER — PROMETHAZINE HCL 25 MG/ML IJ SOLN
12.5000 mg | Freq: Once | INTRAMUSCULAR | Status: AC
  Administered 2015-02-08: 12.5 mg via INTRAVENOUS

## 2015-02-08 NOTE — Patient Instructions (Signed)

## 2015-02-10 ENCOUNTER — Ambulatory Visit: Admission: RE | Admit: 2015-02-10 | Payer: Medicare Other | Source: Ambulatory Visit | Admitting: Radiation Oncology

## 2015-02-10 ENCOUNTER — Ambulatory Visit (HOSPITAL_BASED_OUTPATIENT_CLINIC_OR_DEPARTMENT_OTHER): Payer: Medicare Other

## 2015-02-10 ENCOUNTER — Ambulatory Visit: Payer: Medicare Other

## 2015-02-10 ENCOUNTER — Encounter: Payer: Self-pay | Admitting: Hematology and Oncology

## 2015-02-10 ENCOUNTER — Ambulatory Visit: Payer: Self-pay

## 2015-02-10 ENCOUNTER — Ambulatory Visit (HOSPITAL_BASED_OUTPATIENT_CLINIC_OR_DEPARTMENT_OTHER): Payer: Medicare Other | Admitting: Hematology and Oncology

## 2015-02-10 ENCOUNTER — Telehealth: Payer: Self-pay | Admitting: *Deleted

## 2015-02-10 ENCOUNTER — Ambulatory Visit
Admission: RE | Admit: 2015-02-10 | Discharge: 2015-02-10 | Disposition: A | Discharge status: Home / Self Care | Payer: Medicare Other | Source: Ambulatory Visit | Attending: Radiation Oncology | Admitting: Radiation Oncology

## 2015-02-10 VITALS — BP 108/58 | HR 93 | Temp 98.8°F | Resp 18

## 2015-02-10 VITALS — BP 132/65 | HR 100 | Temp 98.4°F | Resp 18 | Wt 162.0 lb

## 2015-02-10 DIAGNOSIS — K1231 Oral mucositis (ulcerative) due to antineoplastic therapy: Secondary | ICD-10-CM

## 2015-02-10 DIAGNOSIS — C01 Malignant neoplasm of base of tongue: Secondary | ICD-10-CM | POA: Diagnosis not present

## 2015-02-10 DIAGNOSIS — R112 Nausea with vomiting, unspecified: Secondary | ICD-10-CM | POA: Diagnosis not present

## 2015-02-10 DIAGNOSIS — R634 Abnormal weight loss: Secondary | ICD-10-CM

## 2015-02-10 DIAGNOSIS — R110 Nausea: Secondary | ICD-10-CM

## 2015-02-10 DIAGNOSIS — Z510 Encounter for antineoplastic radiation therapy: Secondary | ICD-10-CM | POA: Diagnosis not present

## 2015-02-10 DIAGNOSIS — T451X5A Adverse effect of antineoplastic and immunosuppressive drugs, initial encounter: Secondary | ICD-10-CM

## 2015-02-10 MED ORDER — ALTEPLASE 2 MG IJ SOLR
2.0000 mg | Freq: Once | INTRAMUSCULAR | Status: DC | PRN
  Filled 2015-02-10: qty 2

## 2015-02-10 MED ORDER — HYDROMORPHONE HCL 1 MG/ML IJ SOLN
2.0000 mg | INTRAMUSCULAR | Status: DC | PRN
  Administered 2015-02-10: 1 mg via INTRAVENOUS
  Filled 2015-02-10: qty 2

## 2015-02-10 MED ORDER — SODIUM CHLORIDE 0.9 % IV SOLN
Freq: Once | INTRAVENOUS | Status: AC
  Administered 2015-02-10: 14:00:00 via INTRAVENOUS

## 2015-02-10 MED ORDER — SODIUM CHLORIDE 0.9 % IJ SOLN
10.0000 mL | INTRAMUSCULAR | Status: DC | PRN
  Administered 2015-02-10: 10 mL
  Filled 2015-02-10: qty 10

## 2015-02-10 MED ORDER — SODIUM CHLORIDE 0.9 % IV SOLN
Freq: Once | INTRAVENOUS | Status: AC
  Administered 2015-02-10: 12:00:00 via INTRAVENOUS

## 2015-02-10 MED ORDER — HYDROMORPHONE HCL 4 MG/ML IJ SOLN
INTRAMUSCULAR | Status: AC
  Filled 2015-02-10: qty 1

## 2015-02-10 MED ORDER — HEPARIN SOD (PORK) LOCK FLUSH 100 UNIT/ML IV SOLN
500.0000 [IU] | Freq: Once | INTRAVENOUS | Status: AC | PRN
  Administered 2015-02-10: 500 [IU]
  Filled 2015-02-10: qty 5

## 2015-02-10 MED ORDER — PROMETHAZINE HCL 25 MG/ML IJ SOLN
12.5000 mg | Freq: Once | INTRAMUSCULAR | Status: AC
  Administered 2015-02-10: 12.5 mg via INTRAVENOUS
  Filled 2015-02-10: qty 1

## 2015-02-10 NOTE — Progress Notes (Signed)
1300: Pt to receive a total of 2 liters of NS per Jill Alexanders, RN

## 2015-02-10 NOTE — Assessment & Plan Note (Signed)
He has started to experience side effects from treatment. He was prescribed viscous lidocaine. I had increased the dose of liquid morphine for pain as needed and dose of fentanyl patch for better pain control last week. However, he is not taking as prescribed. Today, I would give him some IV Dilaudid. I reinforced the importance of taking his pain medications correctly.

## 2015-02-10 NOTE — Telephone Encounter (Signed)
TEMPERATURE IS 99.7, B/P 114/68, AND HR 101. PT. VOMITED ONCE LAST NIGHT AND ONCE THIS MORNING. SINCE VOMITING PT. HAS HAD EIGHT OUNCES OF TUBE FEEDING AND 150 CC OF WATER VIA PEG TUBE. PT. WILL BE UNABLE TO KEEP 2:00PM SPEECH APPOINTMENT. WIFE WANTS TO KNOW IF PT. NEEDS TO COME EARLIER THAN 3:00PM FOR HIS INFUSION.

## 2015-02-10 NOTE — Telephone Encounter (Signed)
PLease tell them to come in now and Add him to my clinic to be seen ASAP

## 2015-02-10 NOTE — Assessment & Plan Note (Signed)
He tolerated cycle one poorly due to nausea. With reduced dose chemotherapy for cycle 2 and the addition of intravenous IV dexamethasone for 2 days after treatment, he denies further nausea. He felt that IV fluids daily is very helpful. In the meantime, continue supportive care He is starting to experience worsening, multiple side effects from treatment and I will monitor for them carefully.  I recommend we start IV fluid support for the remainder of his radiation treatment Plan to see him back again tomorrow and repeat labs. If he failed outpatient therapy, we might have to admit him to complete the rest of his treatment in the hospital.

## 2015-02-10 NOTE — Patient Instructions (Signed)
Dehydration, Adult Dehydration is when you lose more fluids from the body than you take in. Vital organs like the kidneys, brain, and heart cannot function without a proper amount of fluids and salt. Any loss of fluids from the body can cause dehydration.  CAUSES   Vomiting.  Diarrhea.  Excessive sweating.  Excessive urine output.  Fever. SYMPTOMS  Mild dehydration  Thirst.  Dry lips.  Slightly dry mouth. Moderate dehydration  Very dry mouth.  Sunken eyes.  Skin does not bounce back quickly when lightly pinched and released.  Dark urine and decreased urine production.  Decreased tear production.  Headache. Severe dehydration  Very dry mouth.  Extreme thirst.  Rapid, weak pulse (more than 100 beats per minute at rest).  Cold hands and feet.  Not able to sweat in spite of heat and temperature.  Rapid breathing.  Blue lips.  Confusion and lethargy.  Difficulty being awakened.  Minimal urine production.  No tears. DIAGNOSIS  Your caregiver will diagnose dehydration based on your symptoms and your exam. Blood and urine tests will help confirm the diagnosis. The diagnostic evaluation should also identify the cause of dehydration. TREATMENT  Treatment of mild or moderate dehydration can often be done at home by increasing the amount of fluids that you drink. It is best to drink small amounts of fluid more often. Drinking too much at one time can make vomiting worse. Refer to the home care instructions below. Severe dehydration needs to be treated at the hospital where you will probably be given intravenous (IV) fluids that contain water and electrolytes. HOME CARE INSTRUCTIONS   Ask your caregiver about specific rehydration instructions.  Drink enough fluids to keep your urine clear or pale yellow.  Drink small amounts frequently if you have nausea and vomiting.  Eat as you normally do.  Avoid:  Foods or drinks high in sugar.  Carbonated  drinks.  Juice.  Extremely hot or cold fluids.  Drinks with caffeine.  Fatty, greasy foods.  Alcohol.  Tobacco.  Overeating.  Gelatin desserts.  Wash your hands well to avoid spreading bacteria and viruses.  Only take over-the-counter or prescription medicines for pain, discomfort, or fever as directed by your caregiver.  Ask your caregiver if you should continue all prescribed and over-the-counter medicines.  Keep all follow-up appointments with your caregiver. SEEK MEDICAL CARE IF:  You have abdominal pain and it increases or stays in one area (localizes).  You have a rash, stiff neck, or severe headache.  You are irritable, sleepy, or difficult to awaken.  You are weak, dizzy, or extremely thirsty. SEEK IMMEDIATE MEDICAL CARE IF:   You are unable to keep fluids down or you get worse despite treatment.  You have frequent episodes of vomiting or diarrhea.  You have blood or green matter (bile) in your vomit.  You have blood in your stool or your stool looks black and tarry.  You have not urinated in 6 to 8 hours, or you have only urinated a small amount of very dark urine.  You have a fever.  You faint. MAKE SURE YOU:   Understand these instructions.  Will watch your condition.  Will get help right away if you are not doing well or get worse. Document Released: 09/27/2005 Document Revised: 12/20/2011 Document Reviewed: 05/17/2011 ExitCare Patient Information 2015 [employer]. This information is not intended to replace advice given to you by your health care provider. Make sure you discuss any questions you have with your health care   provider.  

## 2015-02-10 NOTE — Telephone Encounter (Signed)
Instructed wife to bring pt to clinic now for Dr. Alvy Bimler to see in office.  She agreed.  I sent message to scheduler.

## 2015-02-10 NOTE — Assessment & Plan Note (Signed)
I recommend addition of IV fluids daily along with IV anti-emetics. I will also add IV dexamethasone for 2 days after treatment along with reduce dose cisplatin He will get additional IV fluids today as he was having trouble yesterday to maintain adequate oral fluid intake

## 2015-02-10 NOTE — Progress Notes (Signed)
Pt reports nausea and pain improved. Pt's wife administering tube feedings at this time.

## 2015-02-10 NOTE — Progress Notes (Signed)
[place] OFFICE PROGRESS NOTE  Patient Care Team: Laurey Morale, MD as PCP - General (Family Medicine) Heath Lark, MD as Consulting Physician (Hematology and Oncology)  SUMMARY OF ONCOLOGIC HISTORY: Oncology History   Cancer of base of tongue   Staging form: Lip and Oral Cavity, AJCC 7th Edition     Clinical stage from 12/03/2014: Stage IVA (T2, N2a, M0) - Signed by Heath Lark, MD on 12/13/2014 HPV negative           Cancer of base of tongue   11/20/2014 Pathology Results Accession: [id number] confirm squamous cell carcinoma.   11/20/2014 Procedure The patient underwent fine-needle aspirate of the right lymph node   12/02/2014 Imaging CT scan showed 2.2 cm the right tongue base mass with necrotic 4 cm right level II nodal metastasis with multiple bilateral small lymphadenopathy   12/10/2014 Imaging PET CT scan showed tongue base mass with right level II lymph node metastasis   12/24/2014 Procedure He has placement of port and feeding tube   01/01/2015 -  Chemotherapy He received high dose cisplatin   01/01/2015 -  Radiation Therapy He received concurrent radiation treatment   01/21/2015 Adverse Reaction  cycle 2 chemotherapy dose will be reduced by 25% due to intolerable side effects    INTERVAL HISTORY: Please see below for problem oriented charting. He is seen urgently today because he felt " he is dying". He felt well on Saturday. Yesterday, he did not receive IV fluids and felt weak, with profound nausea and vomiting. He was able to tolerate only 3 cans of nutritional supplement. Today, he felt really bad and called to be seen urgently. He denies fevers or chills. He had mild constipation.  REVIEW OF SYSTEMS:   Constitutional: Denies fevers, chills  Eyes: Denies blurriness of vision Respiratory: Denies cough, dyspnea or wheezes Cardiovascular: Denies palpitation, chest discomfort or lower extremity swelling Skin: Denies abnormal skin rashes Lymphatics: Denies  new lymphadenopathy or easy bruising Neurological:Denies numbness, tingling or new weaknesses Behavioral/Psych: Mood is stable, no new changes  All other systems were reviewed with the patient and are negative.  I have reviewed the past medical history, past surgical history, social history and family history with the patient and they are unchanged from previous note.  ALLERGIES:  has No Known Allergies.  MEDICATIONS:  Current Outpatient Prescriptions  Medication Sig Dispense Refill  . ASPIRIN PO Take 81 mg by mouth every morning.     . brimonidine (ALPHAGAN) 0.15 % ophthalmic solution   2  . carvedilol (COREG) 25 MG tablet Take 1 tablet (25 mg total) by mouth 2 (two) times daily with a meal. 180 tablet 3  . emollient (BIAFINE) cream Apply topically as needed.    . fentaNYL (DURAGESIC - DOSED MCG/HR) 50 MCG/HR Place 1 patch (50 mcg total) onto the skin every 3 (three) days. 5 patch 0  . fluconazole (DIFLUCAN) 100 MG tablet Take 2 tablets today, then 1 tablet daily x 20 more days. 22 tablet 0  . hyaluronate sodium (RADIAPLEXRX) GEL Apply 1 application topically 2 (two) times daily.    Marland Kitchen lactulose (CHRONULAC) 10 GM/15ML solution Place 15 mLs (10 g total) into feeding tube 3 (three) times daily. 240 mL 6  . lidocaine (XYLOCAINE) 2 % solution Patient: Mix 1part 2% viscous lidocaine, 1part H20. Swish and/or swallow 70mL of this mixture, 66min before meals and at bedtime, up to QID 100 mL 5  . lidocaine-prilocaine (EMLA) cream Apply to affected area once 30 g  3  . LORazepam (ATIVAN) 0.5 MG tablet Take 1 tab up to TID PRN anxiety, nausea, or claustrophobia; take 20 minutes before wearing radiotherapy mask. 60 tablet 0  . morphine (ROXANOL) 20 MG/ML concentrated solution Take 1 mL (20 mg total) by mouth every 2 (two) hours as needed for severe pain. 240 mL 0  . multivitamin (THERAGRAN) per tablet Take 1 tablet by mouth every morning.     . ondansetron (ZOFRAN) 8 MG tablet Take 1 tablet (8 mg total)  by mouth every 8 (eight) hours as needed. 30 tablet 1  . prochlorperazine (COMPAZINE) 10 MG tablet Take 1 tablet (10 mg total) by mouth every 6 (six) hours as needed (Nausea or vomiting). 30 tablet 1  . simvastatin (ZOCOR) 40 MG tablet Take 1 tablet (40 mg total) by mouth at bedtime. 90 tablet 3  . sodium fluoride (FLUORISHIELD) 1.1 % GEL dental gel Instill one drop of gel per tooth space of fluoride tray. Place over teeth for 5 minutes. Remove. Spit out excess. Repeat nightly. 120 mL prn  . sucralfate (CARAFATE) 1 G tablet Dissolve 1 tablet in 10 mL H20 and swallow 30 min prior to meals and bedtime. 60 tablet 5  . tadalafil (CIALIS) 20 MG tablet Take 1 tablet (20 mg total) by mouth daily as needed for erectile dysfunction. (Patient not taking: Reported on 01/21/2015) 30 tablet 3  . tamsulosin (FLOMAX) 0.4 MG CAPS capsule Take 1 capsule (0.4 mg total) by mouth daily. 30 capsule 5  . TRAVATAN Z 0.004 % SOLN ophthalmic solution Place 1 drop into both eyes at bedtime.      No current facility-administered medications for this visit.   Facility-Administered Medications Ordered in Other Visits  Medication Dose Route Frequency Provider Last Rate Last Dose  . alteplase (CATHFLO ACTIVASE) injection 2 mg  2 mg Intracatheter Once PRN Heath Lark, MD      . HYDROmorphone (DILAUDID) injection 2 mg  2 mg Intravenous Q2H PRN Heath Lark, MD   1 mg at 02/10/15 1229  . sodium chloride 0.9 % injection 10 mL  10 mL Intracatheter PRN Heath Lark, MD   10 mL at 02/10/15 1623    PHYSICAL EXAMINATION: ECOG PERFORMANCE STATUS: 2 - Symptomatic, <50% confined to bed  Filed Vitals:   02/10/15 1204  BP: 132/65  Pulse: 100  Temp: 98.4 F (36.9 C)  Resp: 18   Filed Weights   02/10/15 1204  Weight: 162 lb (73.483 kg)    GENERAL:alert, no distress and comfortable. He looks weak SKIN: Significant rash around his neck and the back. No ulceration. OROPHARYNX: Significant dry mucous membrane with mucositis. No  thrush. NEC: supple, thyroid normal size, non-tender, without nodularity LYMPH:Persistent palpable lymphadenopathy, about the same from previous visit LUGS: clear to auscultation and percussion with normal breathing effort HEART: regular rate & rhythm and no murmurs and no lower extremity edema ABDOMEN:abdomen soft, non-tender and normal bowel sounds. Feeding to site looks okay  Musculoskeletal:no cyanosis of digits and no clubbing  NEURO: alert & oriented x 3 with fluent speech, no focal motor/sensory deficits  LABORATORY DATA:  I have reviewed the data as listed    Component Value Date/Time   NA 139 01/21/2015 1349   NA 141 12/24/2014 0755   K 3.8 01/21/2015 1349   K 3.8 12/24/2014 0755   CL 109 12/24/2014 0755   CO2 29 01/21/2015 1349   CO2 27 12/24/2014 0755   GLUCOSE 134 01/21/2015 1349   GLUCOSE 118* 12/24/2014 0755  BUN 17.3 01/21/2015 1349   BUN 15 12/24/2014 0755   CREATININE 1.1 01/21/2015 1349   CREATININE 0.99 12/24/2014 0755   CALCIUM 9.5 01/21/2015 1349   CALCIUM 8.9 12/24/2014 0755   PROT 7.1 01/21/2015 1349   PROT 6.9 12/24/2014 0755   ALBUMIN 3.2* 01/21/2015 1349   ALBUMIN 4.0 12/24/2014 0755   AST 12 01/21/2015 1349   AST 21 12/24/2014 0755   ALT 14 01/21/2015 1349   ALT 25 12/24/2014 0755   ALKPHOS 86 01/21/2015 1349   ALKPHOS 63 12/24/2014 0755   BILITOT 0.29 01/21/2015 1349   BILITOT 0.7 12/24/2014 0755   GFRNONAA 80* 12/24/2014 0755   GFRAA >90 12/24/2014 0755    No results found for: SPEP, UPEP  Lab Results  Component Value Date   WBC 2.6* 01/21/2015   NEUTROABS 1.3* 01/21/2015   HGB 15.4 01/21/2015   HCT 46.6 01/21/2015   MCV 89.1 01/21/2015   PLT 219 01/21/2015      Chemistry      Component Value Date/Time   NA 139 01/21/2015 1349   NA 141 12/24/2014 0755   K 3.8 01/21/2015 1349   K 3.8 12/24/2014 0755   CL 109 12/24/2014 0755   CO2 29 01/21/2015 1349   CO2 27 12/24/2014 0755   BUN 17.3 01/21/2015 1349   BUN 15 12/24/2014  0755   CREATININE 1.1 01/21/2015 1349   CREATININE 0.99 12/24/2014 0755      Component Value Date/Time   CALCIUM 9.5 01/21/2015 1349   CALCIUM 8.9 12/24/2014 0755   ALKPHOS 86 01/21/2015 1349   ALKPHOS 63 12/24/2014 0755   AST 12 01/21/2015 1349   AST 21 12/24/2014 0755   ALT 14 01/21/2015 1349   ALT 25 12/24/2014 0755   BILITOT 0.29 01/21/2015 1349   BILITOT 0.7 12/24/2014 0755      ASSESSMENT & PLAN:  Cancer of base of tongue He tolerated cycle one poorly due to nausea. With reduced dose chemotherapy for cycle 2 and the addition of intravenous IV dexamethasone for 2 days after treatment, he denies further nausea. He felt that IV fluids daily is very helpful. In the meantime, continue supportive care He is starting to experience worsening, multiple side effects from treatment and I will monitor for them carefully.  I recommend we start IV fluid support for the remainder of his radiation treatment Plan to see him back again tomorrow and repeat labs. If he failed outpatient therapy, we might have to admit him to complete the rest of his treatment in the hospital.    Chemotherapy induced nausea and vomiting I recommend addition of IV fluids daily along with IV anti-emetics. I will also add IV dexamethasone for 2 days after treatment along with reduce dose cisplatin He will get additional IV fluids today as he was having trouble yesterday to maintain adequate oral fluid intake    Mucositis due to chemotherapy He has started to experience side effects from treatment. He was prescribed viscous lidocaine. I had increased the dose of liquid morphine for pain as needed and dose of fentanyl patch for better pain control last week. However, he is not taking as prescribed. Today, I would give him some IV Dilaudid. I reinforced the importance of taking his pain medications correctly.   Weight loss He has progressive weight loss and have difficulties maintaining his weight. I would  increase additional IV fluid hydration today. He will follow with dietitian closely. If he is not able to tolerate outpatient therapy, he  may need to be admitted to the hospital for further management.    No orders of the defined types were placed in this encounter.   All questions were answered. The patient knows to call the clinic with any problems, questions or concerns. No barriers to learning was detected. I spent 30 minutes counseling the patient face to face. The total time spent in the appointment was 40 minutes and more than 50% was on counseling and review of test results     [facility], [place], MD 02/10/2015 4:35 PM

## 2015-02-10 NOTE — Assessment & Plan Note (Signed)
He has progressive weight loss and have difficulties maintaining his weight. I would increase additional IV fluid hydration today. He will follow with dietitian closely. If he is not able to tolerate outpatient therapy, he may need to be admitted to the hospital for further management.

## 2015-02-11 ENCOUNTER — Telehealth: Payer: Self-pay | Admitting: Hematology and Oncology

## 2015-02-11 ENCOUNTER — Ambulatory Visit
Admission: RE | Admit: 2015-02-11 | Discharge: 2015-02-11 | Disposition: A | Discharge status: Home / Self Care | Payer: Medicare Other | Source: Ambulatory Visit | Attending: Radiation Oncology | Admitting: Radiation Oncology

## 2015-02-11 ENCOUNTER — Encounter: Payer: Self-pay | Admitting: Hematology and Oncology

## 2015-02-11 ENCOUNTER — Encounter: Payer: Self-pay | Admitting: Radiation Oncology

## 2015-02-11 ENCOUNTER — Other Ambulatory Visit (HOSPITAL_BASED_OUTPATIENT_CLINIC_OR_DEPARTMENT_OTHER): Payer: Medicare Other

## 2015-02-11 ENCOUNTER — Ambulatory Visit: Payer: Medicare Other | Admitting: Nutrition

## 2015-02-11 ENCOUNTER — Ambulatory Visit (HOSPITAL_BASED_OUTPATIENT_CLINIC_OR_DEPARTMENT_OTHER): Payer: Medicare Other

## 2015-02-11 ENCOUNTER — Other Ambulatory Visit: Payer: Self-pay | Admitting: *Deleted

## 2015-02-11 ENCOUNTER — Ambulatory Visit (HOSPITAL_BASED_OUTPATIENT_CLINIC_OR_DEPARTMENT_OTHER): Payer: Medicare Other | Admitting: Hematology and Oncology

## 2015-02-11 VITALS — BP 114/69 | HR 84 | Temp 98.7°F

## 2015-02-11 VITALS — BP 122/62 | HR 92 | Temp 98.8°F | Resp 18 | Wt 162.0 lb

## 2015-02-11 VITALS — BP 108/58 | HR 93 | Temp 98.8°F | Resp 18

## 2015-02-11 DIAGNOSIS — L814 Other melanin hyperpigmentation: Secondary | ICD-10-CM | POA: Insufficient documentation

## 2015-02-11 DIAGNOSIS — D701 Agranulocytosis secondary to cancer chemotherapy: Secondary | ICD-10-CM | POA: Diagnosis not present

## 2015-02-11 DIAGNOSIS — R112 Nausea with vomiting, unspecified: Secondary | ICD-10-CM

## 2015-02-11 DIAGNOSIS — Z931 Gastrostomy status: Secondary | ICD-10-CM | POA: Insufficient documentation

## 2015-02-11 DIAGNOSIS — L539 Erythematous condition, unspecified: Secondary | ICD-10-CM | POA: Insufficient documentation

## 2015-02-11 DIAGNOSIS — C01 Malignant neoplasm of base of tongue: Secondary | ICD-10-CM

## 2015-02-11 DIAGNOSIS — K1231 Oral mucositis (ulcerative) due to antineoplastic therapy: Secondary | ICD-10-CM | POA: Diagnosis not present

## 2015-02-11 DIAGNOSIS — E860 Dehydration: Secondary | ICD-10-CM

## 2015-02-11 DIAGNOSIS — R110 Nausea: Secondary | ICD-10-CM

## 2015-02-11 DIAGNOSIS — L598 Other specified disorders of the skin and subcutaneous tissue related to radiation: Secondary | ICD-10-CM | POA: Insufficient documentation

## 2015-02-11 DIAGNOSIS — Z510 Encounter for antineoplastic radiation therapy: Secondary | ICD-10-CM | POA: Diagnosis present

## 2015-02-11 DIAGNOSIS — R634 Abnormal weight loss: Secondary | ICD-10-CM | POA: Insufficient documentation

## 2015-02-11 LAB — CBC WITH DIFFERENTIAL/PLATELET
BASO%: 0.5 % (ref 0.0–2.0)
BASOS ABS: 0 10*3/uL (ref 0.0–0.1)
EOS ABS: 0.2 10*3/uL (ref 0.0–0.5)
EOS%: 7.3 % — ABNORMAL HIGH (ref 0.0–7.0)
HEMATOCRIT: 41.2 % (ref 38.4–49.9)
HEMOGLOBIN: 13.4 g/dL (ref 13.0–17.1)
LYMPH%: 8.1 % — ABNORMAL LOW (ref 14.0–49.0)
MCH: 28.3 pg (ref 27.2–33.4)
MCHC: 32.5 g/dL (ref 32.0–36.0)
MCV: 87 fL (ref 79.3–98.0)
MONO#: 1.1 10*3/uL — ABNORMAL HIGH (ref 0.1–0.9)
MONO%: 40.1 % — AB (ref 0.0–14.0)
NEUT%: 44 % (ref 39.0–75.0)
NEUTROS ABS: 1.2 10*3/uL — AB (ref 1.5–6.5)
PLATELETS: 317 10*3/uL (ref 140–400)
RBC: 4.73 10*6/uL (ref 4.20–5.82)
RDW: 13.8 % (ref 11.0–14.6)
WBC: 2.6 10*3/uL — ABNORMAL LOW (ref 4.0–10.3)
lymph#: 0.2 10*3/uL — ABNORMAL LOW (ref 0.9–3.3)

## 2015-02-11 LAB — COMPREHENSIVE METABOLIC PANEL (CC13)
ALK PHOS: 68 U/L (ref 40–150)
ALT: 21 U/L (ref 0–55)
AST: 17 U/L (ref 5–34)
Albumin: 2.8 g/dL — ABNORMAL LOW (ref 3.5–5.0)
Anion Gap: 8 mEq/L (ref 3–11)
BUN: 12.6 mg/dL (ref 7.0–26.0)
CHLORIDE: 98 meq/L (ref 98–109)
CO2: 30 mEq/L — ABNORMAL HIGH (ref 22–29)
CREATININE: 0.8 mg/dL (ref 0.7–1.3)
Calcium: 9.2 mg/dL (ref 8.4–10.4)
EGFR: 88 mL/min/{1.73_m2} — ABNORMAL LOW (ref >90)
Glucose: 124 mg/dl (ref 70–140)
Potassium: 4.1 mEq/L (ref 3.5–5.1)
Sodium: 137 mEq/L (ref 136–145)
Total Bilirubin: 0.28 mg/dL (ref 0.20–1.20)
Total Protein: 6.4 g/dL (ref 6.4–8.3)

## 2015-02-11 LAB — MAGNESIUM (CC13): Magnesium: 2.4 mg/dl (ref 1.5–2.5)

## 2015-02-11 MED ORDER — SODIUM CHLORIDE 0.9 % IJ SOLN
10.0000 mL | INTRAMUSCULAR | Status: DC | PRN
  Filled 2015-02-11: qty 10

## 2015-02-11 MED ORDER — HYDROMORPHONE HCL 1 MG/ML IJ SOLN
2.0000 mg | INTRAMUSCULAR | Status: DC | PRN
  Administered 2015-02-11: 1 mg via INTRAVENOUS
  Filled 2015-02-11: qty 2

## 2015-02-11 MED ORDER — SODIUM CHLORIDE 0.9 % IJ SOLN
10.0000 mL | Freq: Once | INTRAMUSCULAR | Status: AC
  Administered 2015-02-11: 10 mL via INTRAVENOUS

## 2015-02-11 MED ORDER — SODIUM CHLORIDE 0.9 % IV SOLN
Freq: Once | INTRAVENOUS | Status: AC
  Administered 2015-02-11: 14:00:00 via INTRAVENOUS

## 2015-02-11 MED ORDER — PROMETHAZINE HCL 25 MG/ML IJ SOLN
12.5000 mg | Freq: Once | INTRAMUSCULAR | Status: AC
  Administered 2015-02-11: 12.5 mg via INTRAVENOUS
  Filled 2015-02-11: qty 1

## 2015-02-11 MED ORDER — BIAFINE EX EMUL
Freq: Every day | CUTANEOUS | Status: DC
  Administered 2015-02-11: 19:00:00 via TOPICAL

## 2015-02-11 MED ORDER — HYDROMORPHONE HCL 4 MG/ML IJ SOLN
INTRAMUSCULAR | Status: AC
  Filled 2015-02-11: qty 1

## 2015-02-11 MED ORDER — HEPARIN SOD (PORK) LOCK FLUSH 100 UNIT/ML IV SOLN
500.0000 [IU] | Freq: Once | INTRAVENOUS | Status: DC | PRN
  Filled 2015-02-11: qty 5

## 2015-02-11 MED ORDER — HEPARIN SOD (PORK) LOCK FLUSH 100 UNIT/ML IV SOLN
500.0000 [IU] | Freq: Once | INTRAVENOUS | Status: AC
  Administered 2015-02-11: 500 [IU] via INTRAVENOUS

## 2015-02-11 NOTE — Assessment & Plan Note (Signed)
His dehydration has improved with 2 L of IV fluid yesterday. We will continue to the IV fluid 1 L from Mondays through Saturdays for the next few weeks.

## 2015-02-11 NOTE — Progress Notes (Signed)
Nutrition follow-up completed with patient in the infusion area. Patient is no longer able to eat or drink by mouth except for a little water. Patient is trying to tolerate 6 cans of Osmolite 1.5 via PEG tube. Patient reports he did tolerate 6 cans yesterday and feels like he will be able to tolerate 6 cans today. Weight decreased and documented as 162 pounds May 2, down from 169.6 pounds April 25.  Estimated nutrition needs: 2000-2200 calories, 105-120 grams protein, 2.2 L fluid daily.  6 cans Osmolite 1.5 provides 2130 cal, 89.4 g protein, 1086 mL free water.  Nutrition diagnosis: Inadequate oral intake continues.  Intervention: Patient educated to continue Osmolite 1.5-6 cans via tube as tolerated. Encouraged patient to continue to drink water by mouth. Patient reports he has enough samples of tube feeding. Questions answered and teach back method used.  Monitoring, evaluation, goals: Patient will tolerate tube feedings to meet greater than 90% of estimated calorie needs.  Next visit: Tuesday, May 10, during infusion.    **Disclaimer: This note was dictated with voice recognition software. Similar sounding words can inadvertently be transcribed and this note may contain transcription errors which may not have been corrected upon publication of note.**

## 2015-02-11 NOTE — Assessment & Plan Note (Signed)
This is due to side effects of treatment. I will reduce final dose of chemotherapy by 50%.

## 2015-02-11 NOTE — Assessment & Plan Note (Signed)
He tolerated cycle one poorly due to nausea. With reduced dose chemotherapy for cycle 2 and the addition of intravenous IV dexamethasone for 2 days after treatment, he denies further nausea. He felt that IV fluids daily is very helpful. However, with increasing nausea, pain and mucositis, for cycle 3 of treatment tomorrow, I plan to reduce chemotherapy by 50% and continue IV dexamethasone for 2 days after treatment. I recommend we start IV fluid support for the remainder of his radiation treatment I will continue to see him on a weekly basis for supportive care.

## 2015-02-11 NOTE — Telephone Encounter (Signed)
per pof to sch pt appt-sent MW email to sch IV appts-will contact pt once reply

## 2015-02-11 NOTE — Assessment & Plan Note (Signed)
I recommend addition of IV fluids daily along with IV anti-emetics. I will also add IV dexamethasone for 2 days after treatment along with reduce dose cisplatin As above, I plan to reduce the dose of chemotherapy by 50%.

## 2015-02-11 NOTE — Progress Notes (Signed)
[facility] OFFICE PROGRESS NOTE  Patient Care Team: Laurey Morale, MD as PCP - General (Family Medicine) Heath Lark, MD as Consulting Physician (Hematology and Oncology)  SUMMARY OF ONCOLOGIC HISTORY: Oncology History   Cancer of base of tongue   Staging form: Lip and Oral Cavity, AJCC 7th Edition     Clinical stage from 12/03/2014: Stage IVA (T2, N2a, M0) - Signed by Heath Lark, MD on 12/13/2014 HPV negative           Cancer of base of tongue   11/20/2014 Pathology Results Accession: [id number] confirm squamous cell carcinoma.   11/20/2014 Procedure The patient underwent fine-needle aspirate of the right lymph node   12/02/2014 Imaging CT scan showed 2.2 cm the right tongue base mass with necrotic 4 cm right level II nodal metastasis with multiple bilateral small lymphadenopathy   12/10/2014 Imaging PET CT scan showed tongue base mass with right level II lymph node metastasis   12/24/2014 Procedure He has placement of port and feeding tube   01/01/2015 -  Chemotherapy He received high dose cisplatin   01/01/2015 -  Radiation Therapy He received concurrent radiation treatment   01/21/2015 Adverse Reaction  cycle 2 chemotherapy dose will be reduced by 25% due to intolerable side effects   02/11/2015 Adverse Reaction Cycle 3 of chemotherapy dose will be reduced to 50% due to intolerable side effects    INTERVAL HISTORY: Please see below for problem oriented charting. He is seen prior to cycle 3 of treatment. Since additional IV fluids yesterday, he is improving. His pain is bearable and nausea has resolved. He feels well today. He still had persistent mucositis and sore throat, improved compared to yesterday  REVIEW OF SYSTEMS:   Constitutional: Denies fevers, chills or abnormal weight loss Eyes: Denies blurriness of vision Respiratory: Denies cough, dyspnea or wheezes Cardiovascular: Denies palpitation, chest discomfort or lower extremity swelling Lymphatics: Denies new  lymphadenopathy or easy bruising Neurological:Denies numbness, tingling or new weaknesses Behavioral/Psych: Mood is stable, no new changes  All other systems were reviewed with the patient and are negative.  I have reviewed the past medical history, past surgical history, social history and family history with the patient and they are unchanged from previous note.  ALLERGIES:  has No Known Allergies.  MEDICATIONS:  Current Outpatient Prescriptions  Medication Sig Dispense Refill  . ASPIRIN PO Take 81 mg by mouth every morning.     . brimonidine (ALPHAGAN) 0.15 % ophthalmic solution   2  . carvedilol (COREG) 25 MG tablet Take 1 tablet (25 mg total) by mouth 2 (two) times daily with a meal. 180 tablet 3  . emollient (BIAFINE) cream Apply topically as needed.    . fentaNYL (DURAGESIC - DOSED MCG/HR) 50 MCG/HR Place 1 patch (50 mcg total) onto the skin every 3 (three) days. 5 patch 0  . fluconazole (DIFLUCAN) 100 MG tablet Take 2 tablets today, then 1 tablet daily x 20 more days. 22 tablet 0  . hyaluronate sodium (RADIAPLEXRX) GEL Apply 1 application topically 2 (two) times daily.    Marland Kitchen lactulose (CHRONULAC) 10 GM/15ML solution Place 15 mLs (10 g total) into feeding tube 3 (three) times daily. 240 mL 6  . lidocaine (XYLOCAINE) 2 % solution Patient: Mix 1part 2% viscous lidocaine, 1part H20. Swish and/or swallow 65mL of this mixture, 21min before meals and at bedtime, up to QID 100 mL 5  . lidocaine-prilocaine (EMLA) cream Apply to affected area once 30 g 3  . LORazepam (  ATIVAN) 0.5 MG tablet Take 1 tab up to TID PRN anxiety, nausea, or claustrophobia; take 20 minutes before wearing radiotherapy mask. 60 tablet 0  . morphine (ROXANOL) 20 MG/ML concentrated solution Take 1 mL (20 mg total) by mouth every 2 (two) hours as needed for severe pain. 240 mL 0  . multivitamin (THERAGRAN) per tablet Take 1 tablet by mouth every morning.     . ondansetron (ZOFRAN) 8 MG tablet Take 1 tablet (8 mg total) by  mouth every 8 (eight) hours as needed. 30 tablet 1  . prochlorperazine (COMPAZINE) 10 MG tablet Take 1 tablet (10 mg total) by mouth every 6 (six) hours as needed (Nausea or vomiting). 30 tablet 1  . simvastatin (ZOCOR) 40 MG tablet Take 1 tablet (40 mg total) by mouth at bedtime. 90 tablet 3  . sodium fluoride (FLUORISHIELD) 1.1 % GEL dental gel Instill one drop of gel per tooth space of fluoride tray. Place over teeth for 5 minutes. Remove. Spit out excess. Repeat nightly. 120 mL prn  . sucralfate (CARAFATE) 1 G tablet Dissolve 1 tablet in 10 mL H20 and swallow 30 min prior to meals and bedtime. 60 tablet 5  . tadalafil (CIALIS) 20 MG tablet Take 1 tablet (20 mg total) by mouth daily as needed for erectile dysfunction. (Patient not taking: Reported on 01/21/2015) 30 tablet 3  . tamsulosin (FLOMAX) 0.4 MG CAPS capsule Take 1 capsule (0.4 mg total) by mouth daily. 30 capsule 5  . TRAVATAN Z 0.004 % SOLN ophthalmic solution Place 1 drop into both eyes at bedtime.      No current facility-administered medications for this visit.   Facility-Administered Medications Ordered in Other Visits  Medication Dose Route Frequency Provider Last Rate Last Dose  . heparin lock flush 100 unit/mL  500 Units Intracatheter Once PRN Heath Lark, MD      . HYDROmorphone (DILAUDID) injection 2 mg  2 mg Intravenous Q2H PRN Heath Lark, MD   1 mg at 02/11/15 1407  . sodium chloride 0.9 % injection 10 mL  10 mL Intracatheter PRN Heath Lark, MD        PHYSICAL EXAMINATION: ECOG PERFORMANCE STATUS: 1 - Symptomatic but completely ambulatory  Filed Vitals:   02/11/15 1348  BP: 108/58  Pulse: 93  Temp: 98.8 F (37.1 C)  Resp: 18   There were no vitals filed for this visit.  GENERAL:alert, no distress and comfortable SKIN: Significant dermatitis around his neck. No ulceration.  EYES: normal, Conjunctiva are pink and non-injected, sclera clear OROPHARYNX: Moist mucous membrane. No thrush. Persistent mucositis. NECK:  supple, thyroid normal size, non-tender, without nodularity LYMPH:  Persistent palpable lymphadenopathy around his neck, improved.  LUNGS: clear to auscultation and percussion with normal breathing effort HEART: regular rate & rhythm and no murmurs and no lower extremity edema ABDOMEN:abdomen soft, non-tender and normal bowel sounds Musculoskeletal:no cyanosis of digits and no clubbing  NEURO: alert & oriented x 3 with fluent speech, no focal motor/sensory deficits  LABORATORY DATA:  I have reviewed the data as listed    Component Value Date/Time   NA 137 02/11/2015 1345   NA 141 12/24/2014 0755   K 4.1 02/11/2015 1345   K 3.8 12/24/2014 0755   CL 109 12/24/2014 0755   CO2 30* 02/11/2015 1345   CO2 27 12/24/2014 0755   GLUCOSE 124 02/11/2015 1345   GLUCOSE 118* 12/24/2014 0755   BUN 12.6 02/11/2015 1345   BUN 15 12/24/2014 0755   CREATININE 0.8 02/11/2015  1345   CREATININE 0.99 12/24/2014 0755   CALCIUM 9.2 02/11/2015 1345   CALCIUM 8.9 12/24/2014 0755   PROT 6.4 02/11/2015 1345   PROT 6.9 12/24/2014 0755   ALBUMIN 2.8* 02/11/2015 1345   ALBUMIN 4.0 12/24/2014 0755   AST 17 02/11/2015 1345   AST 21 12/24/2014 0755   ALT 21 02/11/2015 1345   ALT 25 12/24/2014 0755   ALKPHOS 68 02/11/2015 1345   ALKPHOS 63 12/24/2014 0755   BILITOT 0.28 02/11/2015 1345   BILITOT 0.7 12/24/2014 0755   GFRNONAA 80* 12/24/2014 0755   GFRAA >90 12/24/2014 0755    No results found for: SPEP, UPEP  Lab Results  Component Value Date   WBC 2.6* 02/11/2015   NEUTROABS 1.2* 02/11/2015   HGB 13.4 02/11/2015   HCT 41.2 02/11/2015   MCV 87.0 02/11/2015   PLT 317 02/11/2015      Chemistry      Component Value Date/Time   NA 137 02/11/2015 1345   NA 141 12/24/2014 0755   K 4.1 02/11/2015 1345   K 3.8 12/24/2014 0755   CL 109 12/24/2014 0755   CO2 30* 02/11/2015 1345   CO2 27 12/24/2014 0755   BUN 12.6 02/11/2015 1345   BUN 15 12/24/2014 0755   CREATININE 0.8 02/11/2015 1345    CREATININE 0.99 12/24/2014 0755      Component Value Date/Time   CALCIUM 9.2 02/11/2015 1345   CALCIUM 8.9 12/24/2014 0755   ALKPHOS 68 02/11/2015 1345   ALKPHOS 63 12/24/2014 0755   AST 17 02/11/2015 1345   AST 21 12/24/2014 0755   ALT 21 02/11/2015 1345   ALT 25 12/24/2014 0755   BILITOT 0.28 02/11/2015 1345   BILITOT 0.7 12/24/2014 0755     ASSESSMENT & PLAN:  Cancer of base of tongue He tolerated cycle one poorly due to nausea. With reduced dose chemotherapy for cycle 2 and the addition of intravenous IV dexamethasone for 2 days after treatment, he denies further nausea. He felt that IV fluids daily is very helpful. However, with increasing nausea, pain and mucositis, for cycle 3 of treatment tomorrow, I plan to reduce chemotherapy by 50% and continue IV dexamethasone for 2 days after treatment. I recommend we start IV fluid support for the remainder of his radiation treatment I will continue to see him on a weekly basis for supportive care.   Chemotherapy induced nausea and vomiting I recommend addition of IV fluids daily along with IV anti-emetics. I will also add IV dexamethasone for 2 days after treatment along with reduce dose cisplatin As above, I plan to reduce the dose of chemotherapy by 50%.    Leukopenia due to antineoplastic chemotherapy This is due to side effects of treatment. I will reduce final dose of chemotherapy by 50%.   Mucositis due to chemotherapy He has progressive weight loss and have difficulties maintaining his weight. He will follow with dietitian closely. If he is not able to tolerate outpatient therapy, he may need to be admitted to the hospital for further management. The patient will also continue on prescription fentanyl patch and liquid morphine sulfate as needed for pain.   Dehydration His dehydration has improved with 2 L of IV fluid yesterday. We will continue to the IV fluid 1 L from Mondays through Saturdays for the next few  weeks.    No orders of the defined types were placed in this encounter.   All questions were answered. The patient knows to call the clinic with  any problems, questions or concerns. No barriers to learning was detected. I spent 25 minutes counseling the patient face to face. The total time spent in the appointment was 30 minutes and more than 50% was on counseling and review of test results     [facility], [place], MD 02/11/2015 3:25 PM

## 2015-02-11 NOTE — Addendum Note (Signed)
Encounter addended by: Heywood Footman, RN on: 02/11/2015  6:43 PM<BR>     Documentation filed: Orders, Dx [employer], Inpatient [facility]

## 2015-02-11 NOTE — Addendum Note (Signed)
Encounter addended by: Heywood Footman, RN on: 02/11/2015  6:40 PM<BR>     Documentation filed: Notes Section

## 2015-02-11 NOTE — Assessment & Plan Note (Addendum)
He has progressive weight loss and have difficulties maintaining his weight. He will follow with dietitian closely. If he is not able to tolerate outpatient therapy, he may need to be admitted to the hospital for further management. The patient will also continue on prescription fentanyl patch and liquid morphine sulfate as needed for pain.

## 2015-02-11 NOTE — Progress Notes (Signed)
   Weekly Management Note:  Outpatient    ICD-9-CM ICD-10-CM   1. Cancer of base of tongue 141.0 C01     Current Dose: 60 Gy  Projected Dose: 70 Gy   Narrative:  The patient presents for routine under treatment assessment.  CBCT/MVCT images/Port film x-rays were reviewed.  The chart was checked.  Dry desquamation and hyperpigmentation of anterior neck noted. Reports using Biafine as directed. Reports using PEG tube for nourishment. Reports taking very little by mouth. Dry mouth noted. Weight and vitals stable. Per Ander Purpura, RN in med onc chemo is planned for tomorrow at a reduced dose. Daily IVF.   BP 122/62 mmHg  Pulse 92  Temp(Src) 98.8 F (37.1 C) (Oral)  Resp 18  Wt 162 lb (73.483 kg)  SpO2 97% Physical Findings: oropharynx is erythematous and dry w/ no thrush; + neck desquamation and hyperpigmentation  Wt Readings from Last 3 Encounters:  02/11/15 162 lb (73.483 kg)  02/10/15 162 lb (73.483 kg)  02/03/15 169 lb 9.6 oz (76.93 kg)     CBC    Component Value Date/Time   WBC 2.6* 02/11/2015 1345   WBC 7.0 12/24/2014 0755   RBC 4.73 02/11/2015 1345   RBC 5.19 12/24/2014 0755   HGB 13.4 02/11/2015 1345   HGB 15.3 12/24/2014 0755   HCT 41.2 02/11/2015 1345   HCT 46.5 12/24/2014 0755   PLT 317 02/11/2015 1345   PLT 189 12/24/2014 0755   MCV 87.0 02/11/2015 1345   MCV 89.6 12/24/2014 0755   MCH 28.3 02/11/2015 1345   MCH 29.5 12/24/2014 0755   MCHC 32.5 02/11/2015 1345   MCHC 32.9 12/24/2014 0755   RDW 13.8 02/11/2015 1345   RDW 13.8 12/24/2014 0755   LYMPHSABS 0.2* 02/11/2015 1345   LYMPHSABS 1.7 12/24/2014 0755   MONOABS 1.1* 02/11/2015 1345   MONOABS 0.7 12/24/2014 0755   EOSABS 0.2 02/11/2015 1345   EOSABS 0.1 12/24/2014 0755   BASOSABS 0.0 02/11/2015 1345   BASOSABS 0.1 12/24/2014 0755     CMP     Component Value Date/Time   NA 137 02/11/2015 1345   NA 141 12/24/2014 0755   K 4.1 02/11/2015 1345   K 3.8 12/24/2014 0755   CL 109 12/24/2014 0755   CO2  30* 02/11/2015 1345   CO2 27 12/24/2014 0755   GLUCOSE 124 02/11/2015 1345   GLUCOSE 118* 12/24/2014 0755   BUN 12.6 02/11/2015 1345   BUN 15 12/24/2014 0755   CREATININE 0.8 02/11/2015 1345   CREATININE 0.99 12/24/2014 0755   CALCIUM 9.2 02/11/2015 1345   CALCIUM 8.9 12/24/2014 0755   PROT 6.4 02/11/2015 1345   PROT 6.9 12/24/2014 0755   ALBUMIN 2.8* 02/11/2015 1345   ALBUMIN 4.0 12/24/2014 0755   AST 17 02/11/2015 1345   AST 21 12/24/2014 0755   ALT 21 02/11/2015 1345   ALT 25 12/24/2014 0755   ALKPHOS 68 02/11/2015 1345   ALKPHOS 63 12/24/2014 0755   BILITOT 0.28 02/11/2015 1345   BILITOT 0.7 12/24/2014 0755   GFRNONAA 80* 12/24/2014 0755   GFRAA >90 12/24/2014 0755     Impression:  The patient is tolerating radiotherapy.   Plan:  Continue radiotherapy as planned.  Chemotherapy tomorrow, daily IVF. Weight loss - following /w nutrition  -----------------------------------  Eppie Gibson, MD

## 2015-02-11 NOTE — Progress Notes (Signed)
Dry desquamation and hyperpigmentation of anterior neck noted. Reports using Biafine as directed. Reports using PEG tube for nourishment. Reports taking very little by mouth. Dry mouth noted. Weight and vitals stable. Per Ander Purpura, RN in med onc chemo is planned for tomorrow at a reduced dose.

## 2015-02-11 NOTE — Patient Instructions (Signed)

## 2015-02-11 NOTE — Progress Notes (Signed)
Received patient in the clinic following radiation treatment. IV machine alarming because normal saline bag complete. Phoned Lauren, RN in medical oncology explained bag of hydration complete. Per her request discontinued bag of saline and line. Flushed port a cath per protocol. De-accessed power port. Applied a bandaid to old access site. Patient tolerated well.

## 2015-02-12 ENCOUNTER — Ambulatory Visit
Admission: RE | Admit: 2015-02-12 | Discharge: 2015-02-12 | Disposition: A | Discharge status: Home / Self Care | Payer: Medicare Other | Source: Ambulatory Visit | Attending: Radiation Oncology | Admitting: Radiation Oncology

## 2015-02-12 ENCOUNTER — Ambulatory Visit (HOSPITAL_BASED_OUTPATIENT_CLINIC_OR_DEPARTMENT_OTHER): Payer: Medicare Other

## 2015-02-12 ENCOUNTER — Ambulatory Visit: Payer: Self-pay

## 2015-02-12 ENCOUNTER — Telehealth: Payer: Self-pay | Admitting: Hematology and Oncology

## 2015-02-12 VITALS — BP 103/68 | HR 100 | Temp 98.2°F | Resp 16

## 2015-02-12 DIAGNOSIS — C01 Malignant neoplasm of base of tongue: Secondary | ICD-10-CM

## 2015-02-12 DIAGNOSIS — Z5111 Encounter for antineoplastic chemotherapy: Secondary | ICD-10-CM

## 2015-02-12 DIAGNOSIS — Z510 Encounter for antineoplastic radiation therapy: Secondary | ICD-10-CM | POA: Diagnosis not present

## 2015-02-12 MED ORDER — HEPARIN SOD (PORK) LOCK FLUSH 100 UNIT/ML IV SOLN
500.0000 [IU] | Freq: Once | INTRAVENOUS | Status: AC | PRN
  Administered 2015-02-12: 500 [IU]
  Filled 2015-02-12: qty 5

## 2015-02-12 MED ORDER — PALONOSETRON HCL INJECTION 0.25 MG/5ML
0.2500 mg | Freq: Once | INTRAVENOUS | Status: AC
  Administered 2015-02-12: 0.25 mg via INTRAVENOUS

## 2015-02-12 MED ORDER — SODIUM CHLORIDE 0.9 % IJ SOLN
10.0000 mL | INTRAMUSCULAR | Status: DC | PRN
  Administered 2015-02-12: 10 mL
  Filled 2015-02-12: qty 10

## 2015-02-12 MED ORDER — SODIUM CHLORIDE 0.9 % IV SOLN
49.5000 mg/m2 | Freq: Once | INTRAVENOUS | Status: AC
  Administered 2015-02-12: 100 mg via INTRAVENOUS
  Filled 2015-02-12: qty 100

## 2015-02-12 MED ORDER — PALONOSETRON HCL INJECTION 0.25 MG/5ML
INTRAVENOUS | Status: AC
  Filled 2015-02-12: qty 5

## 2015-02-12 MED ORDER — POTASSIUM CHLORIDE 2 MEQ/ML IV SOLN
Freq: Once | INTRAVENOUS | Status: AC
  Administered 2015-02-12: 09:00:00 via INTRAVENOUS
  Filled 2015-02-12: qty 10

## 2015-02-12 MED ORDER — SODIUM CHLORIDE 0.9 % IV SOLN
Freq: Once | INTRAVENOUS | Status: AC
  Administered 2015-02-12: 09:00:00 via INTRAVENOUS

## 2015-02-12 MED ORDER — SODIUM CHLORIDE 0.9 % IV SOLN
Freq: Once | INTRAVENOUS | Status: AC
  Administered 2015-02-12: 11:00:00 via INTRAVENOUS
  Filled 2015-02-12: qty 5

## 2015-02-12 MED ORDER — BIAFINE EX EMUL
Freq: Two times a day (BID) | CUTANEOUS | Status: DC
  Administered 2015-02-12: 2 via TOPICAL

## 2015-02-12 NOTE — Patient Instructions (Signed)
[facility] Discharge Instructions for Patients Receiving Chemotherapy  Today you received the following chemotherapy agents cisplatin.    To help prevent nausea and vomiting after your treatment, we encourage you to take your nausea medication as directed.     If you develop nausea and vomiting that is not controlled by your nausea medication, call the clinic.   BELOW ARE SYMPTOMS THAT SHOULD BE REPORTED IMMEDIATELY:  *FEVER GREATER THAN 100.5 F  *CHILLS WITH OR WITHOUT FEVER  NAUSEA AND VOMITING THAT IS NOT CONTROLLED WITH YOUR NAUSEA MEDICATION  *UNUSUAL SHORTNESS OF BREATH  *UNUSUAL BRUISING OR BLEEDING  TENDERNESS IN MOUTH AND THROAT WITH OR WITHOUT PRESENCE OF ULCERS  *URINARY PROBLEMS  *BOWEL PROBLEMS  UNUSUAL RASH Items with * indicate a potential emergency and should be followed up as soon as possible.  Feel free to call the clinic you have any questions or concerns. The clinic phone number is [phone].  

## 2015-02-12 NOTE — Telephone Encounter (Signed)
ivf added per staff message

## 2015-02-12 NOTE — Progress Notes (Signed)
OK to treat with ANC of 1.2 per MD order

## 2015-02-13 ENCOUNTER — Ambulatory Visit
Admission: RE | Admit: 2015-02-13 | Discharge: 2015-02-13 | Disposition: A | Discharge status: Home / Self Care | Payer: Medicare Other | Source: Ambulatory Visit | Attending: Radiation Oncology | Admitting: Radiation Oncology

## 2015-02-13 ENCOUNTER — Encounter: Payer: Self-pay | Admitting: *Deleted

## 2015-02-13 ENCOUNTER — Ambulatory Visit (HOSPITAL_BASED_OUTPATIENT_CLINIC_OR_DEPARTMENT_OTHER): Payer: Medicare Other

## 2015-02-13 VITALS — BP 108/73 | HR 84 | Temp 97.8°F | Resp 18

## 2015-02-13 DIAGNOSIS — C01 Malignant neoplasm of base of tongue: Secondary | ICD-10-CM

## 2015-02-13 DIAGNOSIS — K1231 Oral mucositis (ulcerative) due to antineoplastic therapy: Secondary | ICD-10-CM

## 2015-02-13 DIAGNOSIS — R112 Nausea with vomiting, unspecified: Secondary | ICD-10-CM | POA: Diagnosis not present

## 2015-02-13 DIAGNOSIS — E860 Dehydration: Secondary | ICD-10-CM | POA: Diagnosis not present

## 2015-02-13 DIAGNOSIS — Z510 Encounter for antineoplastic radiation therapy: Secondary | ICD-10-CM | POA: Diagnosis not present

## 2015-02-13 DIAGNOSIS — R110 Nausea: Secondary | ICD-10-CM

## 2015-02-13 MED ORDER — SODIUM CHLORIDE 0.9 % IJ SOLN
10.0000 mL | INTRAMUSCULAR | Status: DC | PRN
  Administered 2015-02-13: 10 mL
  Filled 2015-02-13: qty 10

## 2015-02-13 MED ORDER — HEPARIN SOD (PORK) LOCK FLUSH 100 UNIT/ML IV SOLN
500.0000 [IU] | Freq: Once | INTRAVENOUS | Status: AC | PRN
  Administered 2015-02-13: 500 [IU]
  Filled 2015-02-13: qty 5

## 2015-02-13 MED ORDER — SODIUM CHLORIDE 0.9 % IV SOLN
1000.0000 mL | Freq: Once | INTRAVENOUS | Status: AC
  Administered 2015-02-13: 1000 mL via INTRAVENOUS

## 2015-02-13 MED ORDER — PROMETHAZINE HCL 25 MG/ML IJ SOLN
12.5000 mg | Freq: Once | INTRAMUSCULAR | Status: AC
  Administered 2015-02-13: 12.5 mg via INTRAVENOUS
  Filled 2015-02-13: qty 1

## 2015-02-13 NOTE — Patient Instructions (Signed)
Dehydration, Adult Dehydration is when you lose more fluids from the body than you take in. Vital organs like the kidneys, brain, and heart cannot function without a proper amount of fluids and salt. Any loss of fluids from the body can cause dehydration.  CAUSES   Vomiting.  Diarrhea.  Excessive sweating.  Excessive urine output.  Fever. SYMPTOMS  Mild dehydration  Thirst.  Dry lips.  Slightly dry mouth. Moderate dehydration  Very dry mouth.  Sunken eyes.  Skin does not bounce back quickly when lightly pinched and released.  Dark urine and decreased urine production.  Decreased tear production.  Headache. Severe dehydration  Very dry mouth.  Extreme thirst.  Rapid, weak pulse (more than 100 beats per minute at rest).  Cold hands and feet.  Not able to sweat in spite of heat and temperature.  Rapid breathing.  Blue lips.  Confusion and lethargy.  Difficulty being awakened.  Minimal urine production.  No tears. DIAGNOSIS  Your caregiver will diagnose dehydration based on your symptoms and your exam. Blood and urine tests will help confirm the diagnosis. The diagnostic evaluation should also identify the cause of dehydration. TREATMENT  Treatment of mild or moderate dehydration can often be done at home by increasing the amount of fluids that you drink. It is best to drink small amounts of fluid more often. Drinking too much at one time can make vomiting worse. Refer to the home care instructions below. Severe dehydration needs to be treated at the hospital where you will probably be given intravenous (IV) fluids that contain water and electrolytes. HOME CARE INSTRUCTIONS   Ask your caregiver about specific rehydration instructions.  Drink enough fluids to keep your urine clear or pale yellow.  Drink small amounts frequently if you have nausea and vomiting.  Eat as you normally do.  Avoid:  Foods or drinks high in sugar.  Carbonated  drinks.  Juice.  Extremely hot or cold fluids.  Drinks with caffeine.  Fatty, greasy foods.  Alcohol.  Tobacco.  Overeating.  Gelatin desserts.  Wash your hands well to avoid spreading bacteria and viruses.  Only take over-the-counter or prescription medicines for pain, discomfort, or fever as directed by your caregiver.  Ask your caregiver if you should continue all prescribed and over-the-counter medicines.  Keep all follow-up appointments with your caregiver. SEEK MEDICAL CARE IF:  You have abdominal pain and it increases or stays in one area (localizes).  You have a rash, stiff neck, or severe headache.  You are irritable, sleepy, or difficult to awaken.  You are weak, dizzy, or extremely thirsty. SEEK IMMEDIATE MEDICAL CARE IF:   You are unable to keep fluids down or you get worse despite treatment.  You have frequent episodes of vomiting or diarrhea.  You have blood or green matter (bile) in your vomit.  You have blood in your stool or your stool looks black and tarry.  You have not urinated in 6 to 8 hours, or you have only urinated a small amount of very dark urine.  You have a fever.  You faint. MAKE SURE YOU:   Understand these instructions.  Will watch your condition.  Will get help right away if you are not doing well or get worse. Document Released: 09/27/2005 Document Revised: 12/20/2011 Document Reviewed: 05/17/2011 ExitCare Patient Information 2015 [employer]. This information is not intended to replace advice given to you by your health care provider. Make sure you discuss any questions you have with your health care   provider.  

## 2015-02-13 NOTE — Progress Notes (Signed)
To provide support and encouragement, care continuity and to assess for needs, met with patient in Infusion while he was receiving IVF.  His wife was with him. 1. He recognized that the IVFs are helping him during this phase of his treatments. 2. He reiterated the value he places on the PEG for helping him through treatment.  3. He expressed excitement that he has only 3 radiation tmts remaining. 4. At his request, I provided him split gauze, bottle of saline, a pair of mesh briefs. 5. He did not express any needs or concerns at this time, I encouraged him to contact me if that changes, he verbalized understanding.  Gayleen Orem, RN, BSN, [place] at [employer] [phone]

## 2015-02-13 NOTE — Progress Notes (Signed)
Pt states he does not want decadron today, only phenergan.

## 2015-02-14 ENCOUNTER — Other Ambulatory Visit: Payer: Self-pay | Admitting: Medical Oncology

## 2015-02-14 ENCOUNTER — Ambulatory Visit
Admission: RE | Admit: 2015-02-14 | Discharge: 2015-02-14 | Disposition: A | Discharge status: Home / Self Care | Payer: Medicare Other | Source: Ambulatory Visit | Attending: Radiation Oncology | Admitting: Radiation Oncology

## 2015-02-14 ENCOUNTER — Ambulatory Visit (HOSPITAL_BASED_OUTPATIENT_CLINIC_OR_DEPARTMENT_OTHER): Payer: Medicare Other

## 2015-02-14 VITALS — BP 130/76 | HR 76 | Temp 97.9°F | Resp 18

## 2015-02-14 DIAGNOSIS — Z510 Encounter for antineoplastic radiation therapy: Secondary | ICD-10-CM | POA: Diagnosis not present

## 2015-02-14 DIAGNOSIS — R112 Nausea with vomiting, unspecified: Secondary | ICD-10-CM

## 2015-02-14 DIAGNOSIS — E860 Dehydration: Secondary | ICD-10-CM

## 2015-02-14 DIAGNOSIS — R110 Nausea: Secondary | ICD-10-CM

## 2015-02-14 DIAGNOSIS — K1231 Oral mucositis (ulcerative) due to antineoplastic therapy: Secondary | ICD-10-CM

## 2015-02-14 DIAGNOSIS — C01 Malignant neoplasm of base of tongue: Secondary | ICD-10-CM

## 2015-02-14 MED ORDER — PROMETHAZINE HCL 25 MG/ML IJ SOLN
12.5000 mg | Freq: Once | INTRAMUSCULAR | Status: AC
  Administered 2015-02-14: 12.5 mg via INTRAVENOUS
  Filled 2015-02-14: qty 1

## 2015-02-14 MED ORDER — SODIUM CHLORIDE 0.9 % IV SOLN
10.0000 mg | Freq: Once | INTRAVENOUS | Status: AC
  Administered 2015-02-14: 10 mg via INTRAVENOUS
  Filled 2015-02-14: qty 1

## 2015-02-14 MED ORDER — SODIUM CHLORIDE 0.9 % IV SOLN
Freq: Once | INTRAVENOUS | Status: AC
Start: 2015-02-14 — End: 2015-02-14
  Administered 2015-02-14: 15:00:00 via INTRAVENOUS

## 2015-02-14 MED ORDER — SODIUM CHLORIDE 0.9 % IJ SOLN
10.0000 mL | INTRAMUSCULAR | Status: DC | PRN
  Administered 2015-02-14: 10 mL
  Filled 2015-02-14: qty 10

## 2015-02-14 MED ORDER — HEPARIN SOD (PORK) LOCK FLUSH 100 UNIT/ML IV SOLN
500.0000 [IU] | Freq: Once | INTRAVENOUS | Status: AC | PRN
  Administered 2015-02-14: 500 [IU]
  Filled 2015-02-14: qty 5

## 2015-02-14 NOTE — Patient Instructions (Signed)
Dehydration, Adult Dehydration is when you lose more fluids from the body than you take in. Vital organs like the kidneys, brain, and heart cannot function without a proper amount of fluids and salt. Any loss of fluids from the body can cause dehydration.  CAUSES   Vomiting.  Diarrhea.  Excessive sweating.  Excessive urine output.  Fever. SYMPTOMS  Mild dehydration  Thirst.  Dry lips.  Slightly dry mouth. Moderate dehydration  Very dry mouth.  Sunken eyes.  Skin does not bounce back quickly when lightly pinched and released.  Dark urine and decreased urine production.  Decreased tear production.  Headache. Severe dehydration  Very dry mouth.  Extreme thirst.  Rapid, weak pulse (more than 100 beats per minute at rest).  Cold hands and feet.  Not able to sweat in spite of heat and temperature.  Rapid breathing.  Blue lips.  Confusion and lethargy.  Difficulty being awakened.  Minimal urine production.  No tears. DIAGNOSIS  Your caregiver will diagnose dehydration based on your symptoms and your exam. Blood and urine tests will help confirm the diagnosis. The diagnostic evaluation should also identify the cause of dehydration. TREATMENT  Treatment of mild or moderate dehydration can often be done at home by increasing the amount of fluids that you drink. It is best to drink small amounts of fluid more often. Drinking too much at one time can make vomiting worse. Refer to the home care instructions below. Severe dehydration needs to be treated at the hospital where you will probably be given intravenous (IV) fluids that contain water and electrolytes. HOME CARE INSTRUCTIONS   Ask your caregiver about specific rehydration instructions.  Drink enough fluids to keep your urine clear or pale yellow.  Drink small amounts frequently if you have nausea and vomiting.  Eat as you normally do.  Avoid:  Foods or drinks high in sugar.  Carbonated  drinks.  Juice.  Extremely hot or cold fluids.  Drinks with caffeine.  Fatty, greasy foods.  Alcohol.  Tobacco.  Overeating.  Gelatin desserts.  Wash your hands well to avoid spreading bacteria and viruses.  Only take over-the-counter or prescription medicines for pain, discomfort, or fever as directed by your caregiver.  Ask your caregiver if you should continue all prescribed and over-the-counter medicines.  Keep all follow-up appointments with your caregiver. SEEK MEDICAL CARE IF:  You have abdominal pain and it increases or stays in one area (localizes).  You have a rash, stiff neck, or severe headache.  You are irritable, sleepy, or difficult to awaken.  You are weak, dizzy, or extremely thirsty. SEEK IMMEDIATE MEDICAL CARE IF:   You are unable to keep fluids down or you get worse despite treatment.  You have frequent episodes of vomiting or diarrhea.  You have blood or green matter (bile) in your vomit.  You have blood in your stool or your stool looks black and tarry.  You have not urinated in 6 to 8 hours, or you have only urinated a small amount of very dark urine.  You have a fever.  You faint. MAKE SURE YOU:   Understand these instructions.  Will watch your condition.  Will get help right away if you are not doing well or get worse. Document Released: 09/27/2005 Document Revised: 12/20/2011 Document Reviewed: 05/17/2011 ExitCare Patient Information 2015 [employer]. This information is not intended to replace advice given to you by your health care provider. Make sure you discuss any questions you have with your health care   provider.  

## 2015-02-15 ENCOUNTER — Ambulatory Visit (HOSPITAL_BASED_OUTPATIENT_CLINIC_OR_DEPARTMENT_OTHER): Payer: Medicare Other

## 2015-02-15 VITALS — BP 145/81 | HR 78 | Temp 97.8°F | Resp 18

## 2015-02-15 DIAGNOSIS — R110 Nausea: Secondary | ICD-10-CM | POA: Diagnosis not present

## 2015-02-15 DIAGNOSIS — C01 Malignant neoplasm of base of tongue: Secondary | ICD-10-CM | POA: Diagnosis not present

## 2015-02-15 DIAGNOSIS — E860 Dehydration: Secondary | ICD-10-CM

## 2015-02-15 MED ORDER — SODIUM CHLORIDE 0.9 % IJ SOLN
10.0000 mL | INTRAMUSCULAR | Status: DC | PRN
  Administered 2015-02-15: 10 mL
  Filled 2015-02-15: qty 10

## 2015-02-15 MED ORDER — PROMETHAZINE HCL 25 MG/ML IJ SOLN
12.5000 mg | Freq: Once | INTRAMUSCULAR | Status: AC
  Administered 2015-02-15: 12.5 mg via INTRAVENOUS

## 2015-02-15 MED ORDER — ALTEPLASE 2 MG IJ SOLR
2.0000 mg | Freq: Once | INTRAMUSCULAR | Status: DC | PRN
  Filled 2015-02-15: qty 2

## 2015-02-15 MED ORDER — HEPARIN SOD (PORK) LOCK FLUSH 100 UNIT/ML IV SOLN
500.0000 [IU] | Freq: Once | INTRAVENOUS | Status: AC | PRN
  Administered 2015-02-15: 500 [IU]
  Filled 2015-02-15: qty 5

## 2015-02-15 MED ORDER — HEPARIN SOD (PORK) LOCK FLUSH 100 UNIT/ML IV SOLN
250.0000 [IU] | Freq: Once | INTRAVENOUS | Status: DC | PRN
  Filled 2015-02-15: qty 5

## 2015-02-15 MED ORDER — SODIUM CHLORIDE 0.9 % IV SOLN
1000.0000 mL | Freq: Once | INTRAVENOUS | Status: AC
  Administered 2015-02-15: 09:00:00 via INTRAVENOUS

## 2015-02-15 NOTE — Patient Instructions (Signed)
Dehydration, Adult Dehydration is when you lose more fluids from the body than you take in. Vital organs like the kidneys, brain, and heart cannot function without a proper amount of fluids and salt. Any loss of fluids from the body can cause dehydration.  CAUSES   Vomiting.  Diarrhea.  Excessive sweating.  Excessive urine output.  Fever. SYMPTOMS  Mild dehydration  Thirst.  Dry lips.  Slightly dry mouth. Moderate dehydration  Very dry mouth.  Sunken eyes.  Skin does not bounce back quickly when lightly pinched and released.  Dark urine and decreased urine production.  Decreased tear production.  Headache. Severe dehydration  Very dry mouth.  Extreme thirst.  Rapid, weak pulse (more than 100 beats per minute at rest).  Cold hands and feet.  Not able to sweat in spite of heat and temperature.  Rapid breathing.  Blue lips.  Confusion and lethargy.  Difficulty being awakened.  Minimal urine production.  No tears. DIAGNOSIS  Your caregiver will diagnose dehydration based on your symptoms and your exam. Blood and urine tests will help confirm the diagnosis. The diagnostic evaluation should also identify the cause of dehydration. TREATMENT  Treatment of mild or moderate dehydration can often be done at home by increasing the amount of fluids that you drink. It is best to drink small amounts of fluid more often. Drinking too much at one time can make vomiting worse. Refer to the home care instructions below. Severe dehydration needs to be treated at the hospital where you will probably be given intravenous (IV) fluids that contain water and electrolytes. HOME CARE INSTRUCTIONS   Ask your caregiver about specific rehydration instructions.  Drink enough fluids to keep your urine clear or pale yellow.  Drink small amounts frequently if you have nausea and vomiting.  Eat as you normally do.  Avoid:  Foods or drinks high in sugar.  Carbonated  drinks.  Juice.  Extremely hot or cold fluids.  Drinks with caffeine.  Fatty, greasy foods.  Alcohol.  Tobacco.  Overeating.  Gelatin desserts.  Wash your hands well to avoid spreading bacteria and viruses.  Only take over-the-counter or prescription medicines for pain, discomfort, or fever as directed by your caregiver.  Ask your caregiver if you should continue all prescribed and over-the-counter medicines.  Keep all follow-up appointments with your caregiver. SEEK MEDICAL CARE IF:  You have abdominal pain and it increases or stays in one area (localizes).  You have a rash, stiff neck, or severe headache.  You are irritable, sleepy, or difficult to awaken.  You are weak, dizzy, or extremely thirsty. SEEK IMMEDIATE MEDICAL CARE IF:   You are unable to keep fluids down or you get worse despite treatment.  You have frequent episodes of vomiting or diarrhea.  You have blood or green matter (bile) in your vomit.  You have blood in your stool or your stool looks black and tarry.  You have not urinated in 6 to 8 hours, or you have only urinated a small amount of very dark urine.  You have a fever.  You faint. MAKE SURE YOU:   Understand these instructions.  Will watch your condition.  Will get help right away if you are not doing well or get worse. Document Released: 09/27/2005 Document Revised: 12/20/2011 Document Reviewed: 05/17/2011 ExitCare Patient Information 2015 [employer]. This information is not intended to replace advice given to you by your health care provider. Make sure you discuss any questions you have with your health care   provider.  

## 2015-02-17 ENCOUNTER — Ambulatory Visit
Admission: RE | Admit: 2015-02-17 | Discharge: 2015-02-17 | Disposition: A | Discharge status: Home / Self Care | Payer: Medicare Other | Source: Ambulatory Visit | Attending: Radiation Oncology | Admitting: Radiation Oncology

## 2015-02-17 ENCOUNTER — Ambulatory Visit (HOSPITAL_BASED_OUTPATIENT_CLINIC_OR_DEPARTMENT_OTHER): Payer: Medicare Other

## 2015-02-17 ENCOUNTER — Encounter: Payer: Self-pay | Admitting: Hematology and Oncology

## 2015-02-17 ENCOUNTER — Ambulatory Visit (HOSPITAL_BASED_OUTPATIENT_CLINIC_OR_DEPARTMENT_OTHER): Payer: Medicare Other | Admitting: Hematology and Oncology

## 2015-02-17 ENCOUNTER — Encounter: Payer: Self-pay | Admitting: *Deleted

## 2015-02-17 VITALS — BP 122/55 | HR 62 | Temp 98.0°F | Wt 160.4 lb

## 2015-02-17 DIAGNOSIS — Z510 Encounter for antineoplastic radiation therapy: Secondary | ICD-10-CM | POA: Diagnosis not present

## 2015-02-17 DIAGNOSIS — R110 Nausea: Secondary | ICD-10-CM

## 2015-02-17 DIAGNOSIS — K1231 Oral mucositis (ulcerative) due to antineoplastic therapy: Secondary | ICD-10-CM

## 2015-02-17 DIAGNOSIS — C01 Malignant neoplasm of base of tongue: Secondary | ICD-10-CM

## 2015-02-17 DIAGNOSIS — E860 Dehydration: Secondary | ICD-10-CM

## 2015-02-17 DIAGNOSIS — R634 Abnormal weight loss: Secondary | ICD-10-CM

## 2015-02-17 MED ORDER — SODIUM CHLORIDE 0.9 % IV SOLN
Freq: Once | INTRAVENOUS | Status: AC
  Administered 2015-02-17: 16:00:00 via INTRAVENOUS

## 2015-02-17 MED ORDER — FENTANYL 50 MCG/HR TD PT72: 50.0000 ug | MEDICATED_PATCH | TRANSDERMAL | Status: DC

## 2015-02-17 MED ORDER — HEPARIN SOD (PORK) LOCK FLUSH 100 UNIT/ML IV SOLN
500.0000 [IU] | Freq: Once | INTRAVENOUS | Status: AC | PRN
  Administered 2015-02-17: 500 [IU]
  Filled 2015-02-17: qty 5

## 2015-02-17 MED ORDER — PROMETHAZINE HCL 25 MG/ML IJ SOLN
12.5000 mg | Freq: Once | INTRAMUSCULAR | Status: AC
  Administered 2015-02-17: 12.5 mg via INTRAVENOUS
  Filled 2015-02-17: qty 1

## 2015-02-17 MED ORDER — SODIUM CHLORIDE 0.9 % IJ SOLN
10.0000 mL | INTRAMUSCULAR | Status: DC | PRN
  Administered 2015-02-17: 10 mL
  Filled 2015-02-17: qty 10

## 2015-02-17 NOTE — Progress Notes (Signed)
To provide support and encouragement, care continuity and to assess for needs, met briefly with patient after RT, his wife during.  Recognized that tomorrow is last RT, acknowledged how well he has done throughout tmts.  We discussed the gradual recovery from tmt beginning 2-3 weeks after last RT.  He did not express any needs or concerns at this time, I encouraged him to contact me if that changes, he verbalized understanding.  Gayleen Orem, RN, BSN, [place] at [place] [phone]

## 2015-02-17 NOTE — Progress Notes (Signed)
   Weekly Management Note:  Outpatient    ICD-9-CM ICD-10-CM   1. Cancer of base of tongue 141.0 C01     Current Dose: 68 Gy  Projected Dose: 70 Gy   Narrative:  The patient presents for routine under treatment assessment.  CBCT/MVCT images/Port film x-rays were reviewed.  The chart was checked.  Weekly assessment of radiation to head/neck.Patient completes treatment  Tomorrow. Patient   Fatigued. Denies sore throat. Scheduled for iv fluids today.   Patient will continue application of biafine.    BP 122/55 mmHg  Pulse 62  Temp(Src) 98 F (36.7 C)  Wt 160 lb 6.4 oz (72.757 kg)  SpO2 99% Physical Findings: oropharynx is erythematous with mucositis but w/ no thrush; no neck masses, + neck desquamation and hyperpigmentation  Wt Readings from Last 3 Encounters:  02/11/15 162 lb (73.483 kg)  02/10/15 162 lb (73.483 kg)  02/03/15 169 lb 9.6 oz (76.93 kg)     CBC    Component Value Date/Time   WBC 2.6* 02/11/2015 1345   WBC 7.0 12/24/2014 0755   RBC 4.73 02/11/2015 1345   RBC 5.19 12/24/2014 0755   HGB 13.4 02/11/2015 1345   HGB 15.3 12/24/2014 0755   HCT 41.2 02/11/2015 1345   HCT 46.5 12/24/2014 0755   PLT 317 02/11/2015 1345   PLT 189 12/24/2014 0755   MCV 87.0 02/11/2015 1345   MCV 89.6 12/24/2014 0755   MCH 28.3 02/11/2015 1345   MCH 29.5 12/24/2014 0755   MCHC 32.5 02/11/2015 1345   MCHC 32.9 12/24/2014 0755   RDW 13.8 02/11/2015 1345   RDW 13.8 12/24/2014 0755   LYMPHSABS 0.2* 02/11/2015 1345   LYMPHSABS 1.7 12/24/2014 0755   MONOABS 1.1* 02/11/2015 1345   MONOABS 0.7 12/24/2014 0755   EOSABS 0.2 02/11/2015 1345   EOSABS 0.1 12/24/2014 0755   BASOSABS 0.0 02/11/2015 1345   BASOSABS 0.1 12/24/2014 0755     CMP     Component Value Date/Time   NA 137 02/11/2015 1345   NA 141 12/24/2014 0755   K 4.1 02/11/2015 1345   K 3.8 12/24/2014 0755   CL 109 12/24/2014 0755   CO2 30* 02/11/2015 1345   CO2 27 12/24/2014 0755   GLUCOSE 124 02/11/2015 1345   GLUCOSE  118* 12/24/2014 0755   BUN 12.6 02/11/2015 1345   BUN 15 12/24/2014 0755   CREATININE 0.8 02/11/2015 1345   CREATININE 0.99 12/24/2014 0755   CALCIUM 9.2 02/11/2015 1345   CALCIUM 8.9 12/24/2014 0755   PROT 6.4 02/11/2015 1345   PROT 6.9 12/24/2014 0755   ALBUMIN 2.8* 02/11/2015 1345   ALBUMIN 4.0 12/24/2014 0755   AST 17 02/11/2015 1345   AST 21 12/24/2014 0755   ALT 21 02/11/2015 1345   ALT 25 12/24/2014 0755   ALKPHOS 68 02/11/2015 1345   ALKPHOS 63 12/24/2014 0755   BILITOT 0.28 02/11/2015 1345   BILITOT 0.7 12/24/2014 0755   GFRNONAA 80* 12/24/2014 0755   GFRAA >90 12/24/2014 0755     Impression:  The patient is tolerating radiotherapy.   Plan:  Continue radiotherapy as planned.  Continue daily IVF. Weight loss - following /w nutrition.  F/u in 2-3 weeks  -----------------------------------  Eppie Gibson, MD

## 2015-02-17 NOTE — Patient Instructions (Signed)
Dehydration, Adult Dehydration is when you lose more fluids from the body than you take in. Vital organs like the kidneys, brain, and heart cannot function without a proper amount of fluids and salt. Any loss of fluids from the body can cause dehydration.  CAUSES   Vomiting.  Diarrhea.  Excessive sweating.  Excessive urine output.  Fever. SYMPTOMS  Mild dehydration  Thirst.  Dry lips.  Slightly dry mouth. Moderate dehydration  Very dry mouth.  Sunken eyes.  Skin does not bounce back quickly when lightly pinched and released.  Dark urine and decreased urine production.  Decreased tear production.  Headache. Severe dehydration  Very dry mouth.  Extreme thirst.  Rapid, weak pulse (more than 100 beats per minute at rest).  Cold hands and feet.  Not able to sweat in spite of heat and temperature.  Rapid breathing.  Blue lips.  Confusion and lethargy.  Difficulty being awakened.  Minimal urine production.  No tears. DIAGNOSIS  Your caregiver will diagnose dehydration based on your symptoms and your exam. Blood and urine tests will help confirm the diagnosis. The diagnostic evaluation should also identify the cause of dehydration. TREATMENT  Treatment of mild or moderate dehydration can often be done at home by increasing the amount of fluids that you drink. It is best to drink small amounts of fluid more often. Drinking too much at one time can make vomiting worse. Refer to the home care instructions below. Severe dehydration needs to be treated at the hospital where you will probably be given intravenous (IV) fluids that contain water and electrolytes. HOME CARE INSTRUCTIONS   Ask your caregiver about specific rehydration instructions.  Drink enough fluids to keep your urine clear or pale yellow.  Drink small amounts frequently if you have nausea and vomiting.  Eat as you normally do.  Avoid:  Foods or drinks high in sugar.  Carbonated  drinks.  Juice.  Extremely hot or cold fluids.  Drinks with caffeine.  Fatty, greasy foods.  Alcohol.  Tobacco.  Overeating.  Gelatin desserts.  Wash your hands well to avoid spreading bacteria and viruses.  Only take over-the-counter or prescription medicines for pain, discomfort, or fever as directed by your caregiver.  Ask your caregiver if you should continue all prescribed and over-the-counter medicines.  Keep all follow-up appointments with your caregiver. SEEK MEDICAL CARE IF:  You have abdominal pain and it increases or stays in one area (localizes).  You have a rash, stiff neck, or severe headache.  You are irritable, sleepy, or difficult to awaken.  You are weak, dizzy, or extremely thirsty. SEEK IMMEDIATE MEDICAL CARE IF:   You are unable to keep fluids down or you get worse despite treatment.  You have frequent episodes of vomiting or diarrhea.  You have blood or green matter (bile) in your vomit.  You have blood in your stool or your stool looks black and tarry.  You have not urinated in 6 to 8 hours, or you have only urinated a small amount of very dark urine.  You have a fever.  You faint. MAKE SURE YOU:   Understand these instructions.  Will watch your condition.  Will get help right away if you are not doing well or get worse. Document Released: 09/27/2005 Document Revised: 12/20/2011 Document Reviewed: 05/17/2011 ExitCare Patient Information 2015 [employer]. This information is not intended to replace advice given to you by your health care provider. Make sure you discuss any questions you have with your health care   provider.  

## 2015-02-17 NOTE — Assessment & Plan Note (Signed)
He has progressive weight loss and have difficulties maintaining his weight. He will follow with dietitian closely. If he is not able to tolerate outpatient therapy, he may need to be admitted to the hospital for further management.

## 2015-02-17 NOTE — Progress Notes (Addendum)
Weekly assessment of radiation to head/neck.Patient completes treatment on tomorrow.Skin is dry and hyperpigmented .Patient overly fatigued.Denies sore throat.Skin is coated white.Scheduled for iv fluids today.Will have scheduled for 3 weeks follow up.Patient will continue application of biafine.

## 2015-02-17 NOTE — Assessment & Plan Note (Signed)
He has progressive weight loss and have difficulties maintaining his weight. He will follow with dietitian closely. If he is not able to tolerate outpatient therapy, he may need to be admitted to the hospital for further management. The patient will also continue on prescription fentanyl patch and liquid morphine sulfate as needed for pain.

## 2015-02-17 NOTE — Assessment & Plan Note (Signed)
His dehydration has improved with IV fluid. We will continue to the IV fluid 1 L from Mondays through Saturdays for the next few weeks.

## 2015-02-17 NOTE — Progress Notes (Signed)
[place] OFFICE PROGRESS NOTE  Patient Care Team: Laurey Morale, MD as PCP - General (Family Medicine) Heath Lark, MD as Consulting Physician (Hematology and Oncology)  SUMMARY OF ONCOLOGIC HISTORY: Oncology History   Cancer of base of tongue   Staging form: Lip and Oral Cavity, AJCC 7th Edition     Clinical stage from 12/03/2014: Stage IVA (T2, N2a, M0) - Signed by Heath Lark, MD on 12/13/2014 HPV negative           Cancer of base of tongue   11/20/2014 Pathology Results Accession: [id number] confirm squamous cell carcinoma.   11/20/2014 Procedure The patient underwent fine-needle aspirate of the right lymph node   12/02/2014 Imaging CT scan showed 2.2 cm the right tongue base mass with necrotic 4 cm right level II nodal metastasis with multiple bilateral small lymphadenopathy   12/10/2014 Imaging PET CT scan showed tongue base mass with right level II lymph node metastasis   12/24/2014 Procedure He has placement of port and feeding tube   01/01/2015 -  Chemotherapy He received high dose cisplatin   01/01/2015 -  Radiation Therapy He received concurrent radiation treatment   01/21/2015 Adverse Reaction  cycle 2 chemotherapy dose will be reduced by 25% due to intolerable side effects   02/11/2015 Adverse Reaction Cycle 3 of chemotherapy dose will be reduced to 50% due to intolerable side effects    INTERVAL HISTORY: Please see below for problem oriented charting. He is seen at infusion area today. He got dehydrated yesterday with IV fluid support. He complained of dizziness. Pain appears to be well controlled. He denies nausea or vomiting. He complained of profound fatigue.  REVIEW OF SYSTEMS:   Constitutional: Denies fevers, chills or abnormal weight loss Eyes: Denies blurriness of vision Ears, nose, mouth, throat, and face: Denies mucositis or sore throat Respiratory: Denies cough, dyspnea or wheezes Cardiovascular: Denies palpitation, chest discomfort or lower  extremity swelling Gastrointestinal:  Denies nausea, heartburn or change in bowel habits Skin: Denies abnormal skin rashes Lymphatics: Denies new lymphadenopathy or easy bruising Neurological:Denies numbness, tingling or new weaknesses Behavioral/Psych: Mood is stable, no new changes  All other systems were reviewed with the patient and are negative.  I have reviewed the past medical history, past surgical history, social history and family history with the patient and they are unchanged from previous note.  ALLERGIES:  has No Known Allergies.  MEDICATIONS:  Current Outpatient Prescriptions  Medication Sig Dispense Refill  . ASPIRIN PO Take 81 mg by mouth every morning.     . brimonidine (ALPHAGAN) 0.15 % ophthalmic solution   2  . carvedilol (COREG) 25 MG tablet Take 1 tablet (25 mg total) by mouth 2 (two) times daily with a meal. 180 tablet 3  . emollient (BIAFINE) cream Apply topically as needed.    . fentaNYL (DURAGESIC - DOSED MCG/HR) 50 MCG/HR Place 1 patch (50 mcg total) onto the skin every 3 (three) days. 5 patch 0  . fluconazole (DIFLUCAN) 100 MG tablet Take 2 tablets today, then 1 tablet daily x 20 more days. 22 tablet 0  . lactulose (CHRONULAC) 10 GM/15ML solution Place 15 mLs (10 g total) into feeding tube 3 (three) times daily. 240 mL 6  . lidocaine (XYLOCAINE) 2 % solution Patient: Mix 1part 2% viscous lidocaine, 1part H20. Swish and/or swallow 69mL of this mixture, 55min before meals and at bedtime, up to QID 100 mL 5  . lidocaine-prilocaine (EMLA) cream Apply to affected area once 30 g  3  . LORazepam (ATIVAN) 0.5 MG tablet Take 1 tab up to TID PRN anxiety, nausea, or claustrophobia; take 20 minutes before wearing radiotherapy mask. 60 tablet 0  . morphine (ROXANOL) 20 MG/ML concentrated solution Take 1 mL (20 mg total) by mouth every 2 (two) hours as needed for severe pain. 240 mL 0  . multivitamin (THERAGRAN) per tablet Take 1 tablet by mouth every morning.     .  ondansetron (ZOFRAN) 8 MG tablet Take 1 tablet (8 mg total) by mouth every 8 (eight) hours as needed. 30 tablet 1  . prochlorperazine (COMPAZINE) 10 MG tablet Take 1 tablet (10 mg total) by mouth every 6 (six) hours as needed (Nausea or vomiting). 30 tablet 1  . simvastatin (ZOCOR) 40 MG tablet Take 1 tablet (40 mg total) by mouth at bedtime. 90 tablet 3  . sodium fluoride (FLUORISHIELD) 1.1 % GEL dental gel Instill one drop of gel per tooth space of fluoride tray. Place over teeth for 5 minutes. Remove. Spit out excess. Repeat nightly. 120 mL prn  . sucralfate (CARAFATE) 1 G tablet Dissolve 1 tablet in 10 mL H20 and swallow 30 min prior to meals and bedtime. 60 tablet 5  . tadalafil (CIALIS) 20 MG tablet Take 1 tablet (20 mg total) by mouth daily as needed for erectile dysfunction. 30 tablet 3  . tamsulosin (FLOMAX) 0.4 MG CAPS capsule Take 1 capsule (0.4 mg total) by mouth daily. 30 capsule 5  . TRAVATAN Z 0.004 % SOLN ophthalmic solution Place 1 drop into both eyes at bedtime.      No current facility-administered medications for this visit.   Facility-Administered Medications Ordered in Other Visits  Medication Dose Route Frequency Provider Last Rate Last Dose  . 0.9 %  sodium chloride infusion   Intravenous Once Heath Lark, MD      . heparin lock flush 100 unit/mL  500 Units Intracatheter Once PRN Heath Lark, MD      . sodium chloride 0.9 % injection 10 mL  10 mL Intracatheter PRN Heath Lark, MD        PHYSICAL EXAMINATION: ECOG PERFORMANCE STATUS: 1 - Symptomatic but completely ambulatory GENERAL:alert, no distress and comfortable SKIN: Significant radiation-induced dermatitis around his neck. No ulceration. EYES: normal, Conjunctiva are pink and non-injected, sclera clear OROPHARYNX:no exudate, no erythema and lips, buccal mucosa, and tongue normal . Dry mucous membranes without thrush NECK: supple, thyroid normal size, non-tender, without nodularity LYMPH:  no palpable  lymphadenopathy in the cervical, axillary or inguinal LUNGS: clear to auscultation and percussion with normal breathing effort HEART: regular rate & rhythm and no murmurs and no lower extremity edema ABDOMEN:abdomen soft, non-tender and normal bowel sounds. Feeding tube site looks okay Musculoskeletal:no cyanosis of digits and no clubbing  NEURO: alert & oriented x 3 with fluent speech, no focal motor/sensory deficits  LABORATORY DATA:  I have reviewed the data as listed    Component Value Date/Time   NA 137 02/11/2015 1345   NA 141 12/24/2014 0755   K 4.1 02/11/2015 1345   K 3.8 12/24/2014 0755   CL 109 12/24/2014 0755   CO2 30* 02/11/2015 1345   CO2 27 12/24/2014 0755   GLUCOSE 124 02/11/2015 1345   GLUCOSE 118* 12/24/2014 0755   BUN 12.6 02/11/2015 1345   BUN 15 12/24/2014 0755   CREATININE 0.8 02/11/2015 1345   CREATININE 0.99 12/24/2014 0755   CALCIUM 9.2 02/11/2015 1345   CALCIUM 8.9 12/24/2014 0755   PROT 6.4 02/11/2015  1345   PROT 6.9 12/24/2014 0755   ALBUMIN 2.8* 02/11/2015 1345   ALBUMIN 4.0 12/24/2014 0755   AST 17 02/11/2015 1345   AST 21 12/24/2014 0755   ALT 21 02/11/2015 1345   ALT 25 12/24/2014 0755   ALKPHOS 68 02/11/2015 1345   ALKPHOS 63 12/24/2014 0755   BILITOT 0.28 02/11/2015 1345   BILITOT 0.7 12/24/2014 0755   GFRNONAA 80* 12/24/2014 0755   GFRAA >90 12/24/2014 0755    No results found for: SPEP, UPEP  Lab Results  Component Value Date   WBC 2.6* 02/11/2015   NEUTROABS 1.2* 02/11/2015   HGB 13.4 02/11/2015   HCT 41.2 02/11/2015   MCV 87.0 02/11/2015   PLT 317 02/11/2015      Chemistry      Component Value Date/Time   NA 137 02/11/2015 1345   NA 141 12/24/2014 0755   K 4.1 02/11/2015 1345   K 3.8 12/24/2014 0755   CL 109 12/24/2014 0755   CO2 30* 02/11/2015 1345   CO2 27 12/24/2014 0755   BUN 12.6 02/11/2015 1345   BUN 15 12/24/2014 0755   CREATININE 0.8 02/11/2015 1345   CREATININE 0.99 12/24/2014 0755      Component Value  Date/Time   CALCIUM 9.2 02/11/2015 1345   CALCIUM 8.9 12/24/2014 0755   ALKPHOS 68 02/11/2015 1345   ALKPHOS 63 12/24/2014 0755   AST 17 02/11/2015 1345   AST 21 12/24/2014 0755   ALT 21 02/11/2015 1345   ALT 25 12/24/2014 0755   BILITOT 0.28 02/11/2015 1345   BILITOT 0.7 12/24/2014 0755      ASSESSMENT & PLAN:  Cancer of base of tongue He tolerated cycle one poorly due to nausea. He had significant modification to cycle 2 and cycle 3 of therapy. I recommend we start IV fluid support for the remainder of his radiation treatment I will continue to see him on a weekly basis for supportive care.     Dehydration His dehydration has improved with IV fluid. We will continue to the IV fluid 1 L from Mondays through Saturdays for the next few weeks.   Mucositis due to chemotherapy He has progressive weight loss and have difficulties maintaining his weight. He will follow with dietitian closely. If he is not able to tolerate outpatient therapy, he may need to be admitted to the hospital for further management. The patient will also continue on prescription fentanyl patch and liquid morphine sulfate as needed for pain.   Weight loss He has progressive weight loss and have difficulties maintaining his weight. He will follow with dietitian closely. If he is not able to tolerate outpatient therapy, he may need to be admitted to the hospital for further management.      No orders of the defined types were placed in this encounter.   All questions were answered. The patient knows to call the clinic with any problems, questions or concerns. No barriers to learning was detected. I spent 25 minutes counseling the patient face to face. The total time spent in the appointment was 30 minutes and more than 50% was on counseling and review of test results     [facility], [place], MD 02/17/2015 4:07 PM

## 2015-02-17 NOTE — Assessment & Plan Note (Signed)
He tolerated cycle one poorly due to nausea. He had significant modification to cycle 2 and cycle 3 of therapy. I recommend we start IV fluid support for the remainder of his radiation treatment I will continue to see him on a weekly basis for supportive care.

## 2015-02-18 ENCOUNTER — Ambulatory Visit: Payer: Medicare Other | Admitting: Nutrition

## 2015-02-18 ENCOUNTER — Ambulatory Visit
Admission: RE | Admit: 2015-02-18 | Discharge: 2015-02-18 | Disposition: A | Discharge status: Home / Self Care | Payer: Medicare Other | Source: Ambulatory Visit | Attending: Radiation Oncology | Admitting: Radiation Oncology

## 2015-02-18 ENCOUNTER — Encounter: Payer: Self-pay | Admitting: Radiation Oncology

## 2015-02-18 ENCOUNTER — Ambulatory Visit (HOSPITAL_BASED_OUTPATIENT_CLINIC_OR_DEPARTMENT_OTHER): Payer: Medicare Other

## 2015-02-18 VITALS — BP 130/74 | HR 88 | Temp 98.1°F

## 2015-02-18 DIAGNOSIS — R110 Nausea: Secondary | ICD-10-CM

## 2015-02-18 DIAGNOSIS — E860 Dehydration: Secondary | ICD-10-CM | POA: Diagnosis not present

## 2015-02-18 DIAGNOSIS — C01 Malignant neoplasm of base of tongue: Secondary | ICD-10-CM

## 2015-02-18 DIAGNOSIS — Z510 Encounter for antineoplastic radiation therapy: Secondary | ICD-10-CM | POA: Diagnosis not present

## 2015-02-18 DIAGNOSIS — K1231 Oral mucositis (ulcerative) due to antineoplastic therapy: Secondary | ICD-10-CM

## 2015-02-18 MED ORDER — PROMETHAZINE HCL 25 MG/ML IJ SOLN
12.5000 mg | Freq: Once | INTRAMUSCULAR | Status: AC
  Administered 2015-02-18: 12.5 mg via INTRAVENOUS
  Filled 2015-02-18: qty 1

## 2015-02-18 MED ORDER — SODIUM CHLORIDE 0.9 % IV SOLN
Freq: Once | INTRAVENOUS | Status: AC
  Administered 2015-02-18: 14:00:00 via INTRAVENOUS

## 2015-02-18 MED ORDER — SODIUM CHLORIDE 0.9 % IJ SOLN
10.0000 mL | INTRAMUSCULAR | Status: DC | PRN
  Administered 2015-02-18: 10 mL
  Filled 2015-02-18: qty 10

## 2015-02-18 MED ORDER — HEPARIN SOD (PORK) LOCK FLUSH 100 UNIT/ML IV SOLN
500.0000 [IU] | Freq: Once | INTRAVENOUS | Status: AC | PRN
  Administered 2015-02-18: 500 [IU]
  Filled 2015-02-18: qty 5

## 2015-02-18 NOTE — Progress Notes (Signed)
Nutrition follow-up completed with patient during infusion of IV fluids. Patient is not eating or drinking by mouth. Patient is tolerating 4 cans of Osmolite 1.5 via PEG. Weight continues to decrease and was documented as 160.4 pounds on May 9, down from 162 pounds May 3.   No nutrition concerns at this time.  Nutrition diagnosis: Inadequate oral intake continues.  Intervention:  Patient educated to increase Osmolite 1.5 to1-1/2 cans 4 times a day.  This provides 2130 cal, 89.4 g protein, 1086 mL free water. Patient is agreeable.   Teach back method used.  Monitoring, evaluation, goals:  Patient will increase tube feedings to goal rate of 6 cans daily provides greater than 90% of estimated caloric needs.  Next visit: Monday, May 16.  **Disclaimer: This note was dictated with voice recognition software. Similar sounding words can inadvertently be transcribed and this note may contain transcription errors which may not have been corrected upon publication of note.**

## 2015-02-18 NOTE — Patient Instructions (Signed)

## 2015-02-19 ENCOUNTER — Ambulatory Visit (HOSPITAL_BASED_OUTPATIENT_CLINIC_OR_DEPARTMENT_OTHER): Payer: Medicare Other

## 2015-02-19 ENCOUNTER — Encounter: Payer: Self-pay | Admitting: *Deleted

## 2015-02-19 ENCOUNTER — Ambulatory Visit: Payer: Medicare Other

## 2015-02-19 VITALS — BP 120/72 | HR 85 | Temp 98.7°F | Resp 20

## 2015-02-19 DIAGNOSIS — K1231 Oral mucositis (ulcerative) due to antineoplastic therapy: Secondary | ICD-10-CM

## 2015-02-19 DIAGNOSIS — R110 Nausea: Secondary | ICD-10-CM

## 2015-02-19 DIAGNOSIS — E860 Dehydration: Secondary | ICD-10-CM

## 2015-02-19 DIAGNOSIS — C01 Malignant neoplasm of base of tongue: Secondary | ICD-10-CM

## 2015-02-19 MED ORDER — SODIUM CHLORIDE 0.9 % IV SOLN
Freq: Once | INTRAVENOUS | Status: AC
  Administered 2015-02-19: 15:00:00 via INTRAVENOUS

## 2015-02-19 MED ORDER — SODIUM CHLORIDE 0.9 % IJ SOLN
10.0000 mL | INTRAMUSCULAR | Status: DC | PRN
  Administered 2015-02-19: 10 mL via INTRAVENOUS
  Filled 2015-02-19: qty 10

## 2015-02-19 MED ORDER — HEPARIN SOD (PORK) LOCK FLUSH 100 UNIT/ML IV SOLN
500.0000 [IU] | Freq: Once | INTRAVENOUS | Status: AC
  Administered 2015-02-19: 500 [IU] via INTRAVENOUS
  Filled 2015-02-19: qty 5

## 2015-02-19 MED ORDER — PROMETHAZINE HCL 25 MG/ML IJ SOLN
12.5000 mg | Freq: Once | INTRAMUSCULAR | Status: AC
  Administered 2015-02-19: 12.5 mg via INTRAVENOUS
  Filled 2015-02-19: qty 1

## 2015-02-19 NOTE — Patient Instructions (Signed)

## 2015-02-20 ENCOUNTER — Ambulatory Visit (HOSPITAL_BASED_OUTPATIENT_CLINIC_OR_DEPARTMENT_OTHER): Payer: Medicare Other

## 2015-02-20 VITALS — BP 122/64 | HR 85 | Temp 98.5°F | Resp 16

## 2015-02-20 DIAGNOSIS — E860 Dehydration: Secondary | ICD-10-CM

## 2015-02-20 DIAGNOSIS — K1231 Oral mucositis (ulcerative) due to antineoplastic therapy: Secondary | ICD-10-CM

## 2015-02-20 DIAGNOSIS — C01 Malignant neoplasm of base of tongue: Secondary | ICD-10-CM | POA: Diagnosis not present

## 2015-02-20 DIAGNOSIS — R110 Nausea: Secondary | ICD-10-CM

## 2015-02-20 MED ORDER — PROMETHAZINE HCL 25 MG/ML IJ SOLN
12.5000 mg | Freq: Once | INTRAMUSCULAR | Status: AC
  Administered 2015-02-20: 12.5 mg via INTRAVENOUS
  Filled 2015-02-20: qty 1

## 2015-02-20 MED ORDER — SODIUM CHLORIDE 0.9 % IJ SOLN
10.0000 mL | INTRAMUSCULAR | Status: DC | PRN
  Administered 2015-02-20: 10 mL
  Filled 2015-02-20: qty 10

## 2015-02-20 MED ORDER — HEPARIN SOD (PORK) LOCK FLUSH 100 UNIT/ML IV SOLN
500.0000 [IU] | Freq: Once | INTRAVENOUS | Status: AC | PRN
  Administered 2015-02-20: 500 [IU]
  Filled 2015-02-20: qty 5

## 2015-02-20 MED ORDER — SODIUM CHLORIDE 0.9 % IV SOLN
Freq: Once | INTRAVENOUS | Status: AC
  Administered 2015-02-20: 15:00:00 via INTRAVENOUS

## 2015-02-20 NOTE — Progress Notes (Signed)
Met with patient in Infusion where he was receiving scheduled IVF.  His wife was present. 1. We discussed post-RT activities:  Continued application of Biafine minimally BID.  He stated he has 1 1/2 tubes.  Importance of sun protection, particularly areas that received RT.  He indicated he has bandanas for his neck, wears a hat.  Importance of maintaining nutrition and hydration.  He is using PEG for 5-6 cans supplement daily, includes several syringes of free-water each time uses PEG. 2. He reported significant fatigue.    We discussed the challenge of balancing stamina-building activities with rest. 3. I provided his wife a Certificate of Recognition. 4. I explained that my navigation will continue for the upcoming months. 5. They understand I can be contacted with needs/concerns.  Gayleen Orem, RN, BSN, [place] [facility] [place] [phone]

## 2015-02-20 NOTE — Patient Instructions (Signed)
Dehydration, Adult Dehydration is when you lose more fluids from the body than you take in. Vital organs like the kidneys, brain, and heart cannot function without a proper amount of fluids and salt. Any loss of fluids from the body can cause dehydration.  CAUSES   Vomiting.  Diarrhea.  Excessive sweating.  Excessive urine output.  Fever. SYMPTOMS  Mild dehydration  Thirst.  Dry lips.  Slightly dry mouth. Moderate dehydration  Very dry mouth.  Sunken eyes.  Skin does not bounce back quickly when lightly pinched and released.  Dark urine and decreased urine production.  Decreased tear production.  Headache. Severe dehydration  Very dry mouth.  Extreme thirst.  Rapid, weak pulse (more than 100 beats per minute at rest).  Cold hands and feet.  Not able to sweat in spite of heat and temperature.  Rapid breathing.  Blue lips.  Confusion and lethargy.  Difficulty being awakened.  Minimal urine production.  No tears. DIAGNOSIS  Your caregiver will diagnose dehydration based on your symptoms and your exam. Blood and urine tests will help confirm the diagnosis. The diagnostic evaluation should also identify the cause of dehydration. TREATMENT  Treatment of mild or moderate dehydration can often be done at home by increasing the amount of fluids that you drink. It is best to drink small amounts of fluid more often. Drinking too much at one time can make vomiting worse. Refer to the home care instructions below. Severe dehydration needs to be treated at the hospital where you will probably be given intravenous (IV) fluids that contain water and electrolytes. HOME CARE INSTRUCTIONS   Ask your caregiver about specific rehydration instructions.  Drink enough fluids to keep your urine clear or pale yellow.  Drink small amounts frequently if you have nausea and vomiting.  Eat as you normally do.  Avoid:  Foods or drinks high in sugar.  Carbonated  drinks.  Juice.  Extremely hot or cold fluids.  Drinks with caffeine.  Fatty, greasy foods.  Alcohol.  Tobacco.  Overeating.  Gelatin desserts.  Wash your hands well to avoid spreading bacteria and viruses.  Only take over-the-counter or prescription medicines for pain, discomfort, or fever as directed by your caregiver.  Ask your caregiver if you should continue all prescribed and over-the-counter medicines.  Keep all follow-up appointments with your caregiver. SEEK MEDICAL CARE IF:  You have abdominal pain and it increases or stays in one area (localizes).  You have a rash, stiff neck, or severe headache.  You are irritable, sleepy, or difficult to awaken.  You are weak, dizzy, or extremely thirsty. SEEK IMMEDIATE MEDICAL CARE IF:   You are unable to keep fluids down or you get worse despite treatment.  You have frequent episodes of vomiting or diarrhea.  You have blood or green matter (bile) in your vomit.  You have blood in your stool or your stool looks black and tarry.  You have not urinated in 6 to 8 hours, or you have only urinated a small amount of very dark urine.  You have a fever.  You faint. MAKE SURE YOU:   Understand these instructions.  Will watch your condition.  Will get help right away if you are not doing well or get worse. Document Released: 09/27/2005 Document Revised: 12/20/2011 Document Reviewed: 05/17/2011 ExitCare Patient Information 2015 [employer]. This information is not intended to replace advice given to you by your health care provider. Make sure you discuss any questions you have with your health care   provider.  

## 2015-02-21 ENCOUNTER — Ambulatory Visit (HOSPITAL_BASED_OUTPATIENT_CLINIC_OR_DEPARTMENT_OTHER): Payer: Medicare Other

## 2015-02-21 VITALS — BP 130/79 | HR 87 | Temp 98.4°F

## 2015-02-21 DIAGNOSIS — E860 Dehydration: Secondary | ICD-10-CM | POA: Diagnosis not present

## 2015-02-21 DIAGNOSIS — K1231 Oral mucositis (ulcerative) due to antineoplastic therapy: Secondary | ICD-10-CM

## 2015-02-21 DIAGNOSIS — C01 Malignant neoplasm of base of tongue: Secondary | ICD-10-CM

## 2015-02-21 DIAGNOSIS — R110 Nausea: Secondary | ICD-10-CM

## 2015-02-21 MED ORDER — SODIUM CHLORIDE 0.9 % IV SOLN
1000.0000 mL | Freq: Once | INTRAVENOUS | Status: AC
  Administered 2015-02-21: 15:00:00 via INTRAVENOUS

## 2015-02-21 MED ORDER — SODIUM CHLORIDE 0.9 % IJ SOLN
10.0000 mL | INTRAMUSCULAR | Status: DC | PRN
  Administered 2015-02-21: 10 mL
  Filled 2015-02-21: qty 10

## 2015-02-21 MED ORDER — PROMETHAZINE HCL 25 MG/ML IJ SOLN
12.5000 mg | Freq: Once | INTRAMUSCULAR | Status: AC
  Administered 2015-02-21: 12.5 mg via INTRAVENOUS
  Filled 2015-02-21: qty 1

## 2015-02-21 MED ORDER — HEPARIN SOD (PORK) LOCK FLUSH 100 UNIT/ML IV SOLN
500.0000 [IU] | Freq: Once | INTRAVENOUS | Status: AC | PRN
  Administered 2015-02-21: 500 [IU]
  Filled 2015-02-21: qty 5

## 2015-02-21 NOTE — Progress Notes (Signed)
  Radiation Oncology         [phone] ________________________________  Name: Nicholas Wilkerson MRN: [id number]  Date: 02/18/2015  DOB: Jan 11, 1941  End of Treatment Note  Diagnosis:   T2N2aM0 Stage IVa Right Base of Tongue Squamous Cell Carcinoma   Indication for treatment:  Curative with chemotherapy       Radiation treatment dates:   01/01/2015-02/18/2015  Site/dose:   Base of tongue and bilateral neck / 70 Gy in 35 fractions to gross disease, 63 Gy in 35 fractions to high risk nodal echelons, and 56 Gy in 35 fractions to intermediate risk nodal echelons  Beams/energy:   Helical IMRT / 6 MV photons  Narrative: The patient tolerated radiation treatment relatively well.  At complete, his oropharynx was erythematous with mucositis but w/ no thrush; no palpable neck masses, + neck desquamation and hyperpigmentation of his neck skin. IV fluids were given by medical oncology.  Plan: The patient has completed radiation treatment. The patient will return to radiation oncology clinic for routine followup in one half month. I advised them to call or return sooner if they have any questions or concerns related to their recovery or treatment.  -----------------------------------  Eppie Gibson, MD

## 2015-02-21 NOTE — Patient Instructions (Signed)
Dehydration, Adult Dehydration is when you lose more fluids from the body than you take in. Vital organs like the kidneys, brain, and heart cannot function without a proper amount of fluids and salt. Any loss of fluids from the body can cause dehydration.  CAUSES   Vomiting.  Diarrhea.  Excessive sweating.  Excessive urine output.  Fever. SYMPTOMS  Mild dehydration  Thirst.  Dry lips.  Slightly dry mouth. Moderate dehydration  Very dry mouth.  Sunken eyes.  Skin does not bounce back quickly when lightly pinched and released.  Dark urine and decreased urine production.  Decreased tear production.  Headache. Severe dehydration  Very dry mouth.  Extreme thirst.  Rapid, weak pulse (more than 100 beats per minute at rest).  Cold hands and feet.  Not able to sweat in spite of heat and temperature.  Rapid breathing.  Blue lips.  Confusion and lethargy.  Difficulty being awakened.  Minimal urine production.  No tears. DIAGNOSIS  Your caregiver will diagnose dehydration based on your symptoms and your exam. Blood and urine tests will help confirm the diagnosis. The diagnostic evaluation should also identify the cause of dehydration. TREATMENT  Treatment of mild or moderate dehydration can often be done at home by increasing the amount of fluids that you drink. It is best to drink small amounts of fluid more often. Drinking too much at one time can make vomiting worse. Refer to the home care instructions below. Severe dehydration needs to be treated at the hospital where you will probably be given intravenous (IV) fluids that contain water and electrolytes. HOME CARE INSTRUCTIONS   Ask your caregiver about specific rehydration instructions.  Drink enough fluids to keep your urine clear or pale yellow.  Drink small amounts frequently if you have nausea and vomiting.  Eat as you normally do.  Avoid:  Foods or drinks high in sugar.  Carbonated  drinks.  Juice.  Extremely hot or cold fluids.  Drinks with caffeine.  Fatty, greasy foods.  Alcohol.  Tobacco.  Overeating.  Gelatin desserts.  Wash your hands well to avoid spreading bacteria and viruses.  Only take over-the-counter or prescription medicines for pain, discomfort, or fever as directed by your caregiver.  Ask your caregiver if you should continue all prescribed and over-the-counter medicines.  Keep all follow-up appointments with your caregiver. SEEK MEDICAL CARE IF:  You have abdominal pain and it increases or stays in one area (localizes).  You have a rash, stiff neck, or severe headache.  You are irritable, sleepy, or difficult to awaken.  You are weak, dizzy, or extremely thirsty. SEEK IMMEDIATE MEDICAL CARE IF:   You are unable to keep fluids down or you get worse despite treatment.  You have frequent episodes of vomiting or diarrhea.  You have blood or green matter (bile) in your vomit.  You have blood in your stool or your stool looks black and tarry.  You have not urinated in 6 to 8 hours, or you have only urinated a small amount of very dark urine.  You have a fever.  You faint. MAKE SURE YOU:   Understand these instructions.  Will watch your condition.  Will get help right away if you are not doing well or get worse. Document Released: 09/27/2005 Document Revised: 12/20/2011 Document Reviewed: 05/17/2011 ExitCare Patient Information 2015 [employer]. This information is not intended to replace advice given to you by your health care provider. Make sure you discuss any questions you have with your health care   provider.  

## 2015-02-22 ENCOUNTER — Ambulatory Visit (HOSPITAL_BASED_OUTPATIENT_CLINIC_OR_DEPARTMENT_OTHER): Payer: Medicare Other

## 2015-02-22 VITALS — BP 105/65 | HR 89 | Temp 98.9°F | Resp 18

## 2015-02-22 DIAGNOSIS — C01 Malignant neoplasm of base of tongue: Secondary | ICD-10-CM

## 2015-02-22 DIAGNOSIS — K1231 Oral mucositis (ulcerative) due to antineoplastic therapy: Secondary | ICD-10-CM

## 2015-02-22 DIAGNOSIS — R110 Nausea: Secondary | ICD-10-CM

## 2015-02-22 DIAGNOSIS — E860 Dehydration: Secondary | ICD-10-CM

## 2015-02-22 MED ORDER — SODIUM CHLORIDE 0.9 % IV SOLN
Freq: Once | INTRAVENOUS | Status: AC
  Administered 2015-02-22: 09:00:00 via INTRAVENOUS

## 2015-02-22 MED ORDER — SODIUM CHLORIDE 0.9 % IJ SOLN
10.0000 mL | INTRAMUSCULAR | Status: DC | PRN
  Administered 2015-02-22: 10 mL
  Filled 2015-02-22: qty 10

## 2015-02-22 MED ORDER — HEPARIN SOD (PORK) LOCK FLUSH 100 UNIT/ML IV SOLN
500.0000 [IU] | Freq: Once | INTRAVENOUS | Status: AC | PRN
  Administered 2015-02-22: 500 [IU]
  Filled 2015-02-22: qty 5

## 2015-02-24 ENCOUNTER — Telehealth: Payer: Self-pay | Admitting: *Deleted

## 2015-02-24 ENCOUNTER — Encounter: Payer: Self-pay | Admitting: Hematology and Oncology

## 2015-02-24 ENCOUNTER — Ambulatory Visit (HOSPITAL_BASED_OUTPATIENT_CLINIC_OR_DEPARTMENT_OTHER): Payer: Medicare Other

## 2015-02-24 ENCOUNTER — Telehealth: Payer: Self-pay | Admitting: Hematology and Oncology

## 2015-02-24 ENCOUNTER — Ambulatory Visit: Payer: Medicare Other | Admitting: Nutrition

## 2015-02-24 ENCOUNTER — Ambulatory Visit (HOSPITAL_BASED_OUTPATIENT_CLINIC_OR_DEPARTMENT_OTHER): Payer: Medicare Other | Admitting: Hematology and Oncology

## 2015-02-24 VITALS — BP 132/82 | HR 79 | Temp 98.1°F | Resp 18 | Ht 69.0 in | Wt 163.1 lb

## 2015-02-24 VITALS — BP 120/68 | HR 92 | Resp 18

## 2015-02-24 DIAGNOSIS — E860 Dehydration: Secondary | ICD-10-CM

## 2015-02-24 DIAGNOSIS — R634 Abnormal weight loss: Secondary | ICD-10-CM

## 2015-02-24 DIAGNOSIS — K1231 Oral mucositis (ulcerative) due to antineoplastic therapy: Secondary | ICD-10-CM | POA: Diagnosis not present

## 2015-02-24 DIAGNOSIS — C01 Malignant neoplasm of base of tongue: Secondary | ICD-10-CM

## 2015-02-24 DIAGNOSIS — R112 Nausea with vomiting, unspecified: Secondary | ICD-10-CM | POA: Diagnosis not present

## 2015-02-24 DIAGNOSIS — R110 Nausea: Secondary | ICD-10-CM

## 2015-02-24 DIAGNOSIS — T451X5A Adverse effect of antineoplastic and immunosuppressive drugs, initial encounter: Secondary | ICD-10-CM

## 2015-02-24 MED ORDER — HEPARIN SOD (PORK) LOCK FLUSH 100 UNIT/ML IV SOLN
500.0000 [IU] | Freq: Once | INTRAVENOUS | Status: AC | PRN
  Administered 2015-02-24: 500 [IU]
  Filled 2015-02-24: qty 5

## 2015-02-24 MED ORDER — PROMETHAZINE HCL 25 MG/ML IJ SOLN
12.5000 mg | Freq: Once | INTRAMUSCULAR | Status: AC
  Administered 2015-02-24: 12.5 mg via INTRAVENOUS
  Filled 2015-02-24: qty 1

## 2015-02-24 MED ORDER — SODIUM CHLORIDE 0.9 % IV SOLN
Freq: Once | INTRAVENOUS | Status: AC
  Administered 2015-02-24: 15:00:00 via INTRAVENOUS

## 2015-02-24 MED ORDER — FENTANYL 50 MCG/HR TD PT72: 50.0000 ug | MEDICATED_PATCH | TRANSDERMAL | Status: DC

## 2015-02-24 MED ORDER — SODIUM CHLORIDE 0.9 % IJ SOLN
10.0000 mL | INTRAMUSCULAR | Status: DC | PRN
  Administered 2015-02-24: 10 mL
  Filled 2015-02-24: qty 10

## 2015-02-24 MED ORDER — MORPHINE SULFATE (CONCENTRATE) 20 MG/ML PO SOLN: 20.0000 mg | ORAL | Status: DC | PRN

## 2015-02-24 MED ORDER — ALTEPLASE 2 MG IJ SOLR
2.0000 mg | Freq: Once | INTRAMUSCULAR | Status: DC | PRN
  Filled 2015-02-24: qty 2

## 2015-02-24 NOTE — Telephone Encounter (Signed)
Pt confirmed labs/ov per 05/16 POF, gave pt AVS and Calendar.... KJ, sent msg to add IVF

## 2015-02-24 NOTE — Telephone Encounter (Signed)
Per staff message and POF I have scheduled appts. Advised scheduler of appts. JMW  

## 2015-02-24 NOTE — Assessment & Plan Note (Signed)
His dehydration has improved with IV fluid. Starting next week, I will reduce the frequency of IV fluids to 3 times a week.

## 2015-02-24 NOTE — Assessment & Plan Note (Signed)
He is rating his pain control at 4 out of 10. He will continue on fentanyl patch and liquid morphine as needed. He is aware that I will not be here next week but I have his prescription pain medicine ready for pick up next week for refill.

## 2015-02-24 NOTE — Assessment & Plan Note (Signed)
I recommend addition of IV fluids daily along with IV anti-emetics. He has not have any nausea vomiting for some time.

## 2015-02-24 NOTE — Patient Instructions (Signed)
Dehydration, Adult Dehydration is when you lose more fluids from the body than you take in. Vital organs like the kidneys, brain, and heart cannot function without a proper amount of fluids and salt. Any loss of fluids from the body can cause dehydration.  CAUSES   Vomiting.  Diarrhea.  Excessive sweating.  Excessive urine output.  Fever. SYMPTOMS  Mild dehydration  Thirst.  Dry lips.  Slightly dry mouth. Moderate dehydration  Very dry mouth.  Sunken eyes.  Skin does not bounce back quickly when lightly pinched and released.  Dark urine and decreased urine production.  Decreased tear production.  Headache. Severe dehydration  Very dry mouth.  Extreme thirst.  Rapid, weak pulse (more than 100 beats per minute at rest).  Cold hands and feet.  Not able to sweat in spite of heat and temperature.  Rapid breathing.  Blue lips.  Confusion and lethargy.  Difficulty being awakened.  Minimal urine production.  No tears. DIAGNOSIS  Your caregiver will diagnose dehydration based on your symptoms and your exam. Blood and urine tests will help confirm the diagnosis. The diagnostic evaluation should also identify the cause of dehydration. TREATMENT  Treatment of mild or moderate dehydration can often be done at home by increasing the amount of fluids that you drink. It is best to drink small amounts of fluid more often. Drinking too much at one time can make vomiting worse. Refer to the home care instructions below. Severe dehydration needs to be treated at the hospital where you will probably be given intravenous (IV) fluids that contain water and electrolytes. HOME CARE INSTRUCTIONS   Ask your caregiver about specific rehydration instructions.  Drink enough fluids to keep your urine clear or pale yellow.  Drink small amounts frequently if you have nausea and vomiting.  Eat as you normally do.  Avoid:  Foods or drinks high in sugar.  Carbonated  drinks.  Juice.  Extremely hot or cold fluids.  Drinks with caffeine.  Fatty, greasy foods.  Alcohol.  Tobacco.  Overeating.  Gelatin desserts.  Wash your hands well to avoid spreading bacteria and viruses.  Only take over-the-counter or prescription medicines for pain, discomfort, or fever as directed by your caregiver.  Ask your caregiver if you should continue all prescribed and over-the-counter medicines.  Keep all follow-up appointments with your caregiver. SEEK MEDICAL CARE IF:  You have abdominal pain and it increases or stays in one area (localizes).  You have a rash, stiff neck, or severe headache.  You are irritable, sleepy, or difficult to awaken.  You are weak, dizzy, or extremely thirsty. SEEK IMMEDIATE MEDICAL CARE IF:   You are unable to keep fluids down or you get worse despite treatment.  You have frequent episodes of vomiting or diarrhea.  You have blood or green matter (bile) in your vomit.  You have blood in your stool or your stool looks black and tarry.  You have not urinated in 6 to 8 hours, or you have only urinated a small amount of very dark urine.  You have a fever.  You faint. MAKE SURE YOU:   Understand these instructions.  Will watch your condition.  Will get help right away if you are not doing well or get worse. Document Released: 09/27/2005 Document Revised: 12/20/2011 Document Reviewed: 05/17/2011 ExitCare Patient Information 2015 [employer]. This information is not intended to replace advice given to you by your health care provider. Make sure you discuss any questions you have with your health care   provider.  

## 2015-02-24 NOTE — Progress Notes (Signed)
[place] OFFICE PROGRESS NOTE  Patient Care Team: Laurey Morale, MD as PCP - General (Family Medicine) Heath Lark, MD as Consulting Physician (Hematology and Oncology)  SUMMARY OF ONCOLOGIC HISTORY: Oncology History   Cancer of base of tongue   Staging form: Lip and Oral Cavity, AJCC 7th Edition     Clinical stage from 12/03/2014: Stage IVA (T2, N2a, M0) - Signed by Heath Lark, MD on 12/13/2014 HPV negative           Cancer of base of tongue   11/20/2014 Pathology Results Accession: [id number] confirm squamous cell carcinoma.   11/20/2014 Procedure The patient underwent fine-needle aspirate of the right lymph node   12/02/2014 Imaging CT scan showed 2.2 cm the right tongue base mass with necrotic 4 cm right level II nodal metastasis with multiple bilateral small lymphadenopathy   12/10/2014 Imaging PET CT scan showed tongue base mass with right level II lymph node metastasis   12/24/2014 Procedure He has placement of port and feeding tube   01/01/2015 - 02/12/2015 Chemotherapy He received high dose cisplatin   01/01/2015 - 02/18/2015 Radiation Therapy He received concurrent radiation treatment   01/21/2015 Adverse Reaction  cycle 2 chemotherapy dose will be reduced by 25% due to intolerable side effects   02/11/2015 Adverse Reaction Cycle 3 of chemotherapy dose will be reduced to 50% due to intolerable side effects    INTERVAL HISTORY: Please see below for problem oriented charting.  he returns for further follow-up. He is getting better. He denies no further nausea or vomiting. His rated his throat pain at 4 out of 10 pain. His able to tolerate five nutritional supplements a day.   REVIEW OF SYSTEMS:   Constitutional: Denies fevers, chills or abnormal weight loss Eyes: Denies blurriness of vision Respiratory: Denies cough, dyspnea or wheezes Cardiovascular: Denies palpitation, chest discomfort or lower extremity swelling Gastrointestinal:  Denies nausea, heartburn or  change in bowel habits Skin: Denies abnormal skin rashes Lymphatics: Denies new lymphadenopathy or easy bruising Neurological:Denies numbness, tingling or new weaknesses Behavioral/Psych: Mood is stable, no new changes  All other systems were reviewed with the patient and are negative.  I have reviewed the past medical history, past surgical history, social history and family history with the patient and they are unchanged from previous note.  ALLERGIES:  has No Known Allergies.  MEDICATIONS:  Current Outpatient Prescriptions  Medication Sig Dispense Refill  . ASPIRIN PO Take 81 mg by mouth every morning.     . brimonidine (ALPHAGAN) 0.15 % ophthalmic solution   2  . carvedilol (COREG) 25 MG tablet Take 1 tablet (25 mg total) by mouth 2 (two) times daily with a meal. 180 tablet 3  . emollient (BIAFINE) cream Apply topically as needed.    . fentaNYL (DURAGESIC - DOSED MCG/HR) 50 MCG/HR Place 1 patch (50 mcg total) onto the skin every 3 (three) days. 5 patch 0  . lactulose (CHRONULAC) 10 GM/15ML solution Place 15 mLs (10 g total) into feeding tube 3 (three) times daily. 240 mL 6  . lidocaine (XYLOCAINE) 2 % solution Patient: Mix 1part 2% viscous lidocaine, 1part H20. Swish and/or swallow 75mL of this mixture, 27min before meals and at bedtime, up to QID 100 mL 5  . LORazepam (ATIVAN) 0.5 MG tablet Take 1 tab up to TID PRN anxiety, nausea, or claustrophobia; take 20 minutes before wearing radiotherapy mask. 60 tablet 0  . morphine (ROXANOL) 20 MG/ML concentrated solution Take 1 mL (20 mg total)  by mouth every 2 (two) hours as needed for severe pain. 240 mL 0  . multivitamin (THERAGRAN) per tablet Take 1 tablet by mouth every morning.     . simvastatin (ZOCOR) 40 MG tablet Take 1 tablet (40 mg total) by mouth at bedtime. 90 tablet 3  . sodium fluoride (FLUORISHIELD) 1.1 % GEL dental gel Instill one drop of gel per tooth space of fluoride tray. Place over teeth for 5 minutes. Remove. Spit out  excess. Repeat nightly. 120 mL prn  . sucralfate (CARAFATE) 1 G tablet Dissolve 1 tablet in 10 mL H20 and swallow 30 min prior to meals and bedtime. 60 tablet 5  . tadalafil (CIALIS) 20 MG tablet Take 1 tablet (20 mg total) by mouth daily as needed for erectile dysfunction. 30 tablet 3  . tamsulosin (FLOMAX) 0.4 MG CAPS capsule Take 1 capsule (0.4 mg total) by mouth daily. 30 capsule 5  . TRAVATAN Z 0.004 % SOLN ophthalmic solution Place 1 drop into both eyes at bedtime.      No current facility-administered medications for this visit.    PHYSICAL EXAMINATION: ECOG PERFORMANCE STATUS: 1 - Symptomatic but completely ambulatory  Filed Vitals:   02/24/15 1426  BP: 132/82  Pulse: 79  Temp: 98.1 F (36.7 C)  Resp: 18   Filed Weights   02/24/15 1426  Weight: 163 lb 1.6 oz (73.982 kg)    GENERAL:alert, no distress and comfortable SKIN:   Noted dry skin around his neck. Prior radiation-induced dermatitis has resolved EYES: normal, Conjunctiva are pink and non-injected, sclera clear OROPHARYNX:no exudate, no erythema and lips, buccal mucosa, and tongue normal  NECK: supple, thyroid normal size, non-tender, without nodularity LYMPH:  no palpable lymphadenopathy in the cervical, axillary or inguinal LUNGS: clear to auscultation and percussion with normal breathing effort HEART: regular rate & rhythm and no murmurs and no lower extremity edema ABDOMEN:abdomen soft, non-tender and normal bowel sounds. Feeding tube site looks okay Musculoskeletal:no cyanosis of digits and no clubbing  NEURO: alert & oriented x 3 with fluent speech, no focal motor/sensory deficits  LABORATORY DATA:  I have reviewed the data as listed    Component Value Date/Time   NA 137 02/11/2015 1345   NA 141 12/24/2014 0755   K 4.1 02/11/2015 1345   K 3.8 12/24/2014 0755   CL 109 12/24/2014 0755   CO2 30* 02/11/2015 1345   CO2 27 12/24/2014 0755   GLUCOSE 124 02/11/2015 1345   GLUCOSE 118* 12/24/2014 0755   BUN  12.6 02/11/2015 1345   BUN 15 12/24/2014 0755   CREATININE 0.8 02/11/2015 1345   CREATININE 0.99 12/24/2014 0755   CALCIUM 9.2 02/11/2015 1345   CALCIUM 8.9 12/24/2014 0755   PROT 6.4 02/11/2015 1345   PROT 6.9 12/24/2014 0755   ALBUMIN 2.8* 02/11/2015 1345   ALBUMIN 4.0 12/24/2014 0755   AST 17 02/11/2015 1345   AST 21 12/24/2014 0755   ALT 21 02/11/2015 1345   ALT 25 12/24/2014 0755   ALKPHOS 68 02/11/2015 1345   ALKPHOS 63 12/24/2014 0755   BILITOT 0.28 02/11/2015 1345   BILITOT 0.7 12/24/2014 0755   GFRNONAA 80* 12/24/2014 0755   GFRAA >90 12/24/2014 0755    No results found for: SPEP, UPEP  Lab Results  Component Value Date   WBC 2.6* 02/11/2015   NEUTROABS 1.2* 02/11/2015   HGB 13.4 02/11/2015   HCT 41.2 02/11/2015   MCV 87.0 02/11/2015   PLT 317 02/11/2015      Chemistry  Component Value Date/Time   NA 137 02/11/2015 1345   NA 141 12/24/2014 0755   K 4.1 02/11/2015 1345   K 3.8 12/24/2014 0755   CL 109 12/24/2014 0755   CO2 30* 02/11/2015 1345   CO2 27 12/24/2014 0755   BUN 12.6 02/11/2015 1345   BUN 15 12/24/2014 0755   CREATININE 0.8 02/11/2015 1345   CREATININE 0.99 12/24/2014 0755      Component Value Date/Time   CALCIUM 9.2 02/11/2015 1345   CALCIUM 8.9 12/24/2014 0755   ALKPHOS 68 02/11/2015 1345   ALKPHOS 63 12/24/2014 0755   AST 17 02/11/2015 1345   AST 21 12/24/2014 0755   ALT 21 02/11/2015 1345   ALT 25 12/24/2014 0755   BILITOT 0.28 02/11/2015 1345   BILITOT 0.7 12/24/2014 0755      ASSESSMENT & PLAN:  Cancer of base of tongue He tolerated cycle one poorly due to nausea. He had significant modification to cycle 2 and cycle 3 of therapy. I recommend we continue IV fluid support for  The remainder of the month. Starting next week, I will reduce the frequency to 3 times a week.      Chemotherapy induced nausea and vomiting I recommend addition of IV fluids daily along with IV anti-emetics. He has not have any nausea vomiting  for some time.    Dehydration His dehydration has improved with IV fluid. Starting next week, I will reduce the frequency of IV fluids to 3 times a week.   Mucositis due to chemotherapy  He is rating his pain control at 4 out of 10. He will continue on fentanyl patch and liquid morphine as needed. He is aware that I will not be here next week but I have his prescription pain medicine ready for pick up next week for refill.    Weight loss He has stabilized his recent weight loss He will follow with dietitian closely. He is able to tolerated fine nutritional supplement per day. I encouraged him to try oral liquid intake    Orders Placed This Encounter  Procedures  . CBC with Differential/Platelet    Standing Status: Future     Number of Occurrences:      Standing Expiration Date: 03/30/2016   All questions were answered. The patient knows to call the clinic with any problems, questions or concerns. No barriers to learning was detected. I spent 20 minutes counseling the patient face to face. The total time spent in the appointment was 30 minutes and more than 50% was on counseling and review of test results     [facility], [place], MD 02/24/2015 2:52 PM

## 2015-02-24 NOTE — Progress Notes (Signed)
Patient states he is not feeling well this week. He is receiving IV fluids. Patient is not eating or drinking by mouth. He continues to tolerate Osmolite 1.5 via PEG 5-6 cans daily. Weight has increased and was documented as 163.1 pounds May 16. No nutrition concerns at this time.  Nutrition diagnosis: Inadequate oral intake continues.  Intervention:  Patient should continue 6 cans Osmolite 1.5 daily providing 2130 cal, 89 g protein, and 1086 mL free water. Teach back method used. Additional samples were provided.  Monitoring, evaluation, goals: Patient will tolerate tube feeding at goal rate providing greater than 90% of estimated caloric needs.  Next visit:Wednesday, May 25, during infusion.  **Disclaimer: This note was dictated with voice recognition software. Similar sounding words can inadvertently be transcribed and this note may contain transcription errors which may not have been corrected upon publication of note.**

## 2015-02-24 NOTE — Assessment & Plan Note (Signed)
He tolerated cycle one poorly due to nausea. He had significant modification to cycle 2 and cycle 3 of therapy. I recommend we continue IV fluid support for  The remainder of the month. Starting next week, I will reduce the frequency to 3 times a week.

## 2015-02-24 NOTE — Assessment & Plan Note (Signed)
He has stabilized his recent weight loss He will follow with dietitian closely. He is able to tolerated fine nutritional supplement per day. I encouraged him to try oral liquid intake

## 2015-02-25 ENCOUNTER — Ambulatory Visit (HOSPITAL_BASED_OUTPATIENT_CLINIC_OR_DEPARTMENT_OTHER): Payer: Medicare Other

## 2015-02-25 VITALS — BP 108/83 | HR 77 | Temp 99.0°F

## 2015-02-25 DIAGNOSIS — R110 Nausea: Secondary | ICD-10-CM

## 2015-02-25 DIAGNOSIS — C01 Malignant neoplasm of base of tongue: Secondary | ICD-10-CM | POA: Diagnosis not present

## 2015-02-25 DIAGNOSIS — K1231 Oral mucositis (ulcerative) due to antineoplastic therapy: Secondary | ICD-10-CM

## 2015-02-25 DIAGNOSIS — R112 Nausea with vomiting, unspecified: Secondary | ICD-10-CM | POA: Diagnosis not present

## 2015-02-25 DIAGNOSIS — E860 Dehydration: Secondary | ICD-10-CM | POA: Diagnosis not present

## 2015-02-25 MED ORDER — SODIUM CHLORIDE 0.9 % IV SOLN
10.0000 mg | Freq: Once | INTRAVENOUS | Status: AC
  Administered 2015-02-25: 10 mg via INTRAVENOUS
  Filled 2015-02-25: qty 1

## 2015-02-25 MED ORDER — HEPARIN SOD (PORK) LOCK FLUSH 100 UNIT/ML IV SOLN
500.0000 [IU] | Freq: Once | INTRAVENOUS | Status: AC | PRN
  Administered 2015-02-25: 500 [IU]
  Filled 2015-02-25: qty 5

## 2015-02-25 MED ORDER — SODIUM CHLORIDE 0.9 % IV SOLN
1000.0000 mL | Freq: Once | INTRAVENOUS | Status: AC
  Administered 2015-02-25: 1000 mL via INTRAVENOUS

## 2015-02-25 MED ORDER — HYDROMORPHONE HCL 4 MG/ML IJ SOLN
INTRAMUSCULAR | Status: AC
  Filled 2015-02-25: qty 1

## 2015-02-25 MED ORDER — HYDROMORPHONE HCL 4 MG/ML IJ SOLN
2.0000 mg | Freq: Once | INTRAMUSCULAR | Status: AC
Start: 2015-02-25 — End: 2015-02-25
  Administered 2015-02-25: 2 mg via INTRAVENOUS

## 2015-02-25 MED ORDER — PROMETHAZINE HCL 25 MG/ML IJ SOLN
12.5000 mg | Freq: Once | INTRAMUSCULAR | Status: AC
  Administered 2015-02-25: 12.5 mg via INTRAVENOUS
  Filled 2015-02-25: qty 1

## 2015-02-25 MED ORDER — SODIUM CHLORIDE 0.9 % IJ SOLN
10.0000 mL | INTRAMUSCULAR | Status: DC | PRN
  Administered 2015-02-25: 10 mL
  Filled 2015-02-25: qty 10

## 2015-02-25 NOTE — Patient Instructions (Signed)
Dehydration, Adult Dehydration is when you lose more fluids from the body than you take in. Vital organs like the kidneys, brain, and heart cannot function without a proper amount of fluids and salt. Any loss of fluids from the body can cause dehydration.  CAUSES   Vomiting.  Diarrhea.  Excessive sweating.  Excessive urine output.  Fever. SYMPTOMS  Mild dehydration  Thirst.  Dry lips.  Slightly dry mouth. Moderate dehydration  Very dry mouth.  Sunken eyes.  Skin does not bounce back quickly when lightly pinched and released.  Dark urine and decreased urine production.  Decreased tear production.  Headache. Severe dehydration  Very dry mouth.  Extreme thirst.  Rapid, weak pulse (more than 100 beats per minute at rest).  Cold hands and feet.  Not able to sweat in spite of heat and temperature.  Rapid breathing.  Blue lips.  Confusion and lethargy.  Difficulty being awakened.  Minimal urine production.  No tears. DIAGNOSIS  Your caregiver will diagnose dehydration based on your symptoms and your exam. Blood and urine tests will help confirm the diagnosis. The diagnostic evaluation should also identify the cause of dehydration. TREATMENT  Treatment of mild or moderate dehydration can often be done at home by increasing the amount of fluids that you drink. It is best to drink small amounts of fluid more often. Drinking too much at one time can make vomiting worse. Refer to the home care instructions below. Severe dehydration needs to be treated at the hospital where you will probably be given intravenous (IV) fluids that contain water and electrolytes. HOME CARE INSTRUCTIONS   Ask your caregiver about specific rehydration instructions.  Drink enough fluids to keep your urine clear or pale yellow.  Drink small amounts frequently if you have nausea and vomiting.  Eat as you normally do.  Avoid:  Foods or drinks high in sugar.  Carbonated  drinks.  Juice.  Extremely hot or cold fluids.  Drinks with caffeine.  Fatty, greasy foods.  Alcohol.  Tobacco.  Overeating.  Gelatin desserts.  Wash your hands well to avoid spreading bacteria and viruses.  Only take over-the-counter or prescription medicines for pain, discomfort, or fever as directed by your caregiver.  Ask your caregiver if you should continue all prescribed and over-the-counter medicines.  Keep all follow-up appointments with your caregiver. SEEK MEDICAL CARE IF:  You have abdominal pain and it increases or stays in one area (localizes).  You have a rash, stiff neck, or severe headache.  You are irritable, sleepy, or difficult to awaken.  You are weak, dizzy, or extremely thirsty. SEEK IMMEDIATE MEDICAL CARE IF:   You are unable to keep fluids down or you get worse despite treatment.  You have frequent episodes of vomiting or diarrhea.  You have blood or green matter (bile) in your vomit.  You have blood in your stool or your stool looks black and tarry.  You have not urinated in 6 to 8 hours, or you have only urinated a small amount of very dark urine.  You have a fever.  You faint. MAKE SURE YOU:   Understand these instructions.  Will watch your condition.  Will get help right away if you are not doing well or get worse. Document Released: 09/27/2005 Document Revised: 12/20/2011 Document Reviewed: 05/17/2011 ExitCare Patient Information 2015 [employer]. This information is not intended to replace advice given to you by your health care provider. Make sure you discuss any questions you have with your health care   provider.  

## 2015-02-26 ENCOUNTER — Ambulatory Visit (HOSPITAL_BASED_OUTPATIENT_CLINIC_OR_DEPARTMENT_OTHER): Payer: Medicare Other

## 2015-02-26 VITALS — BP 113/68 | HR 77 | Temp 97.6°F | Resp 16

## 2015-02-26 DIAGNOSIS — R110 Nausea: Secondary | ICD-10-CM

## 2015-02-26 DIAGNOSIS — R112 Nausea with vomiting, unspecified: Secondary | ICD-10-CM

## 2015-02-26 DIAGNOSIS — C01 Malignant neoplasm of base of tongue: Secondary | ICD-10-CM | POA: Diagnosis not present

## 2015-02-26 DIAGNOSIS — E860 Dehydration: Secondary | ICD-10-CM

## 2015-02-26 DIAGNOSIS — K1231 Oral mucositis (ulcerative) due to antineoplastic therapy: Secondary | ICD-10-CM

## 2015-02-26 MED ORDER — SODIUM CHLORIDE 0.9 % IJ SOLN
10.0000 mL | INTRAMUSCULAR | Status: DC | PRN
  Administered 2015-02-26: 10 mL
  Filled 2015-02-26: qty 10

## 2015-02-26 MED ORDER — HYDROMORPHONE HCL 4 MG/ML IJ SOLN
INTRAMUSCULAR | Status: AC
  Filled 2015-02-26: qty 1

## 2015-02-26 MED ORDER — SODIUM CHLORIDE 0.9 % IV SOLN
Freq: Once | INTRAVENOUS | Status: AC
  Administered 2015-02-26: 15:00:00 via INTRAVENOUS

## 2015-02-26 MED ORDER — PROMETHAZINE HCL 25 MG PO TABS
ORAL_TABLET | ORAL | Status: AC
  Filled 2015-02-26: qty 1

## 2015-02-26 MED ORDER — HEPARIN SOD (PORK) LOCK FLUSH 100 UNIT/ML IV SOLN
500.0000 [IU] | Freq: Once | INTRAVENOUS | Status: AC | PRN
  Administered 2015-02-26: 500 [IU]
  Filled 2015-02-26: qty 5

## 2015-02-26 MED ORDER — HYDROMORPHONE HCL 1 MG/ML IJ SOLN
2.0000 mg | INTRAMUSCULAR | Status: DC | PRN
  Administered 2015-02-26: 2 mg via INTRAVENOUS
  Filled 2015-02-26: qty 2

## 2015-02-26 MED ORDER — PROMETHAZINE HCL 25 MG/ML IJ SOLN
12.5000 mg | Freq: Once | INTRAMUSCULAR | Status: AC
  Administered 2015-02-26: 12.5 mg via INTRAVENOUS
  Filled 2015-02-26: qty 1

## 2015-02-26 NOTE — Progress Notes (Signed)
Patient is requesting phenergan.  States if he doesn't take it he gets nauseated.  He is also requesting dilaudid.  This combination worked very well for him yesterday.

## 2015-02-26 NOTE — Patient Instructions (Signed)
Dehydration, Adult Dehydration is when you lose more fluids from the body than you take in. Vital organs like the kidneys, brain, and heart cannot function without a proper amount of fluids and salt. Any loss of fluids from the body can cause dehydration.  CAUSES   Vomiting.  Diarrhea.  Excessive sweating.  Excessive urine output.  Fever. SYMPTOMS  Mild dehydration  Thirst.  Dry lips.  Slightly dry mouth. Moderate dehydration  Very dry mouth.  Sunken eyes.  Skin does not bounce back quickly when lightly pinched and released.  Dark urine and decreased urine production.  Decreased tear production.  Headache. Severe dehydration  Very dry mouth.  Extreme thirst.  Rapid, weak pulse (more than 100 beats per minute at rest).  Cold hands and feet.  Not able to sweat in spite of heat and temperature.  Rapid breathing.  Blue lips.  Confusion and lethargy.  Difficulty being awakened.  Minimal urine production.  No tears. DIAGNOSIS  Your caregiver will diagnose dehydration based on your symptoms and your exam. Blood and urine tests will help confirm the diagnosis. The diagnostic evaluation should also identify the cause of dehydration. TREATMENT  Treatment of mild or moderate dehydration can often be done at home by increasing the amount of fluids that you drink. It is best to drink small amounts of fluid more often. Drinking too much at one time can make vomiting worse. Refer to the home care instructions below. Severe dehydration needs to be treated at the hospital where you will probably be given intravenous (IV) fluids that contain water and electrolytes. HOME CARE INSTRUCTIONS   Ask your caregiver about specific rehydration instructions.  Drink enough fluids to keep your urine clear or pale yellow.  Drink small amounts frequently if you have nausea and vomiting.  Eat as you normally do.  Avoid:  Foods or drinks high in sugar.  Carbonated  drinks.  Juice.  Extremely hot or cold fluids.  Drinks with caffeine.  Fatty, greasy foods.  Alcohol.  Tobacco.  Overeating.  Gelatin desserts.  Wash your hands well to avoid spreading bacteria and viruses.  Only take over-the-counter or prescription medicines for pain, discomfort, or fever as directed by your caregiver.  Ask your caregiver if you should continue all prescribed and over-the-counter medicines.  Keep all follow-up appointments with your caregiver. SEEK MEDICAL CARE IF:  You have abdominal pain and it increases or stays in one area (localizes).  You have a rash, stiff neck, or severe headache.  You are irritable, sleepy, or difficult to awaken.  You are weak, dizzy, or extremely thirsty. SEEK IMMEDIATE MEDICAL CARE IF:   You are unable to keep fluids down or you get worse despite treatment.  You have frequent episodes of vomiting or diarrhea.  You have blood or green matter (bile) in your vomit.  You have blood in your stool or your stool looks black and tarry.  You have not urinated in 6 to 8 hours, or you have only urinated a small amount of very dark urine.  You have a fever.  You faint. MAKE SURE YOU:   Understand these instructions.  Will watch your condition.  Will get help right away if you are not doing well or get worse. Document Released: 09/27/2005 Document Revised: 12/20/2011 Document Reviewed: 05/17/2011 ExitCare Patient Information 2015 [employer]. This information is not intended to replace advice given to you by your health care provider. Make sure you discuss any questions you have with your health care   provider.  

## 2015-02-27 ENCOUNTER — Ambulatory Visit (HOSPITAL_BASED_OUTPATIENT_CLINIC_OR_DEPARTMENT_OTHER): Payer: Medicare Other

## 2015-02-27 VITALS — BP 103/56 | HR 80 | Temp 98.1°F | Resp 20

## 2015-02-27 DIAGNOSIS — C01 Malignant neoplasm of base of tongue: Secondary | ICD-10-CM

## 2015-02-27 DIAGNOSIS — K1231 Oral mucositis (ulcerative) due to antineoplastic therapy: Secondary | ICD-10-CM

## 2015-02-27 DIAGNOSIS — R112 Nausea with vomiting, unspecified: Secondary | ICD-10-CM

## 2015-02-27 DIAGNOSIS — R110 Nausea: Secondary | ICD-10-CM

## 2015-02-27 DIAGNOSIS — E860 Dehydration: Secondary | ICD-10-CM

## 2015-02-27 MED ORDER — ALTEPLASE 2 MG IJ SOLR
2.0000 mg | Freq: Once | INTRAMUSCULAR | Status: DC | PRN
  Filled 2015-02-27: qty 2

## 2015-02-27 MED ORDER — PROMETHAZINE HCL 25 MG/ML IJ SOLN
12.5000 mg | Freq: Once | INTRAMUSCULAR | Status: AC
  Administered 2015-02-27: 12.5 mg via INTRAVENOUS
  Filled 2015-02-27: qty 1

## 2015-02-27 MED ORDER — SODIUM CHLORIDE 0.9 % IJ SOLN
10.0000 mL | INTRAMUSCULAR | Status: DC | PRN
  Administered 2015-02-27: 10 mL
  Filled 2015-02-27: qty 10

## 2015-02-27 MED ORDER — HEPARIN SOD (PORK) LOCK FLUSH 100 UNIT/ML IV SOLN
500.0000 [IU] | Freq: Once | INTRAVENOUS | Status: AC | PRN
  Administered 2015-02-27: 500 [IU]
  Filled 2015-02-27: qty 5

## 2015-02-27 MED ORDER — SODIUM CHLORIDE 0.9 % IV SOLN
Freq: Once | INTRAVENOUS | Status: AC
  Administered 2015-02-27: 14:00:00 via INTRAVENOUS

## 2015-02-27 MED ORDER — HEPARIN SOD (PORK) LOCK FLUSH 100 UNIT/ML IV SOLN
250.0000 [IU] | Freq: Once | INTRAVENOUS | Status: DC | PRN
  Filled 2015-02-27: qty 5

## 2015-02-27 NOTE — Patient Instructions (Signed)
Dehydration, Adult Dehydration is when you lose more fluids from the body than you take in. Vital organs like the kidneys, brain, and heart cannot function without a proper amount of fluids and salt. Any loss of fluids from the body can cause dehydration.  CAUSES   Vomiting.  Diarrhea.  Excessive sweating.  Excessive urine output.  Fever. SYMPTOMS  Mild dehydration  Thirst.  Dry lips.  Slightly dry mouth. Moderate dehydration  Very dry mouth.  Sunken eyes.  Skin does not bounce back quickly when lightly pinched and released.  Dark urine and decreased urine production.  Decreased tear production.  Headache. Severe dehydration  Very dry mouth.  Extreme thirst.  Rapid, weak pulse (more than 100 beats per minute at rest).  Cold hands and feet.  Not able to sweat in spite of heat and temperature.  Rapid breathing.  Blue lips.  Confusion and lethargy.  Difficulty being awakened.  Minimal urine production.  No tears. DIAGNOSIS  Your caregiver will diagnose dehydration based on your symptoms and your exam. Blood and urine tests will help confirm the diagnosis. The diagnostic evaluation should also identify the cause of dehydration. TREATMENT  Treatment of mild or moderate dehydration can often be done at home by increasing the amount of fluids that you drink. It is best to drink small amounts of fluid more often. Drinking too much at one time can make vomiting worse. Refer to the home care instructions below. Severe dehydration needs to be treated at the hospital where you will probably be given intravenous (IV) fluids that contain water and electrolytes. HOME CARE INSTRUCTIONS   Ask your caregiver about specific rehydration instructions.  Drink enough fluids to keep your urine clear or pale yellow.  Drink small amounts frequently if you have nausea and vomiting.  Eat as you normally do.  Avoid:  Foods or drinks high in sugar.  Carbonated  drinks.  Juice.  Extremely hot or cold fluids.  Drinks with caffeine.  Fatty, greasy foods.  Alcohol.  Tobacco.  Overeating.  Gelatin desserts.  Wash your hands well to avoid spreading bacteria and viruses.  Only take over-the-counter or prescription medicines for pain, discomfort, or fever as directed by your caregiver.  Ask your caregiver if you should continue all prescribed and over-the-counter medicines.  Keep all follow-up appointments with your caregiver. SEEK MEDICAL CARE IF:  You have abdominal pain and it increases or stays in one area (localizes).  You have a rash, stiff neck, or severe headache.  You are irritable, sleepy, or difficult to awaken.  You are weak, dizzy, or extremely thirsty. SEEK IMMEDIATE MEDICAL CARE IF:   You are unable to keep fluids down or you get worse despite treatment.  You have frequent episodes of vomiting or diarrhea.  You have blood or green matter (bile) in your vomit.  You have blood in your stool or your stool looks black and tarry.  You have not urinated in 6 to 8 hours, or you have only urinated a small amount of very dark urine.  You have a fever.  You faint. MAKE SURE YOU:   Understand these instructions.  Will watch your condition.  Will get help right away if you are not doing well or get worse. Document Released: 09/27/2005 Document Revised: 12/20/2011 Document Reviewed: 05/17/2011 ExitCare Patient Information 2015 [employer]. This information is not intended to replace advice given to you by your health care provider. Make sure you discuss any questions you have with your health care   provider.  

## 2015-02-28 ENCOUNTER — Ambulatory Visit (HOSPITAL_BASED_OUTPATIENT_CLINIC_OR_DEPARTMENT_OTHER): Payer: Medicare Other

## 2015-02-28 VITALS — BP 125/74 | HR 86 | Temp 98.1°F | Resp 16

## 2015-02-28 DIAGNOSIS — C01 Malignant neoplasm of base of tongue: Secondary | ICD-10-CM

## 2015-02-28 DIAGNOSIS — R110 Nausea: Secondary | ICD-10-CM

## 2015-02-28 DIAGNOSIS — E860 Dehydration: Secondary | ICD-10-CM | POA: Diagnosis not present

## 2015-02-28 DIAGNOSIS — K1231 Oral mucositis (ulcerative) due to antineoplastic therapy: Secondary | ICD-10-CM

## 2015-02-28 DIAGNOSIS — R112 Nausea with vomiting, unspecified: Secondary | ICD-10-CM | POA: Diagnosis not present

## 2015-02-28 MED ORDER — PROMETHAZINE HCL 25 MG/ML IJ SOLN
12.5000 mg | Freq: Once | INTRAMUSCULAR | Status: AC
  Administered 2015-02-28: 12.5 mg via INTRAVENOUS
  Filled 2015-02-28: qty 1

## 2015-02-28 MED ORDER — HEPARIN SOD (PORK) LOCK FLUSH 100 UNIT/ML IV SOLN
500.0000 [IU] | Freq: Once | INTRAVENOUS | Status: AC | PRN
  Administered 2015-02-28: 500 [IU]
  Filled 2015-02-28: qty 5

## 2015-02-28 MED ORDER — SODIUM CHLORIDE 0.9 % IJ SOLN
10.0000 mL | INTRAMUSCULAR | Status: DC | PRN
  Administered 2015-02-28: 10 mL
  Filled 2015-02-28: qty 10

## 2015-02-28 MED ORDER — SODIUM CHLORIDE 0.9 % IV SOLN
Freq: Once | INTRAVENOUS | Status: AC
  Administered 2015-02-28: 14:00:00 via INTRAVENOUS

## 2015-03-01 ENCOUNTER — Ambulatory Visit (HOSPITAL_BASED_OUTPATIENT_CLINIC_OR_DEPARTMENT_OTHER): Payer: Medicare Other

## 2015-03-01 VITALS — BP 124/55 | HR 89 | Temp 97.9°F | Resp 16

## 2015-03-01 DIAGNOSIS — K1231 Oral mucositis (ulcerative) due to antineoplastic therapy: Secondary | ICD-10-CM | POA: Diagnosis not present

## 2015-03-01 DIAGNOSIS — C01 Malignant neoplasm of base of tongue: Secondary | ICD-10-CM | POA: Diagnosis not present

## 2015-03-01 DIAGNOSIS — R110 Nausea: Secondary | ICD-10-CM | POA: Diagnosis not present

## 2015-03-01 MED ORDER — PROMETHAZINE HCL 25 MG/ML IJ SOLN
12.5000 mg | Freq: Once | INTRAMUSCULAR | Status: AC
  Administered 2015-03-01: 12.5 mg via INTRAVENOUS

## 2015-03-01 MED ORDER — SODIUM CHLORIDE 0.9 % IJ SOLN
10.0000 mL | INTRAMUSCULAR | Status: DC | PRN
  Filled 2015-03-01: qty 10

## 2015-03-01 MED ORDER — HEPARIN SOD (PORK) LOCK FLUSH 100 UNIT/ML IV SOLN
500.0000 [IU] | Freq: Once | INTRAVENOUS | Status: DC | PRN
  Filled 2015-03-01: qty 5

## 2015-03-01 MED ORDER — SODIUM CHLORIDE 0.9 % IV SOLN
Freq: Once | INTRAVENOUS | Status: AC
  Administered 2015-03-01: 08:00:00 via INTRAVENOUS

## 2015-03-01 NOTE — Patient Instructions (Signed)
Dehydration, Adult Dehydration is when you lose more fluids from the body than you take in. Vital organs like the kidneys, brain, and heart cannot function without a proper amount of fluids and salt. Any loss of fluids from the body can cause dehydration.  CAUSES   Vomiting.  Diarrhea.  Excessive sweating.  Excessive urine output.  Fever. SYMPTOMS  Mild dehydration  Thirst.  Dry lips.  Slightly dry mouth. Moderate dehydration  Very dry mouth.  Sunken eyes.  Skin does not bounce back quickly when lightly pinched and released.  Dark urine and decreased urine production.  Decreased tear production.  Headache. Severe dehydration  Very dry mouth.  Extreme thirst.  Rapid, weak pulse (more than 100 beats per minute at rest).  Cold hands and feet.  Not able to sweat in spite of heat and temperature.  Rapid breathing.  Blue lips.  Confusion and lethargy.  Difficulty being awakened.  Minimal urine production.  No tears. DIAGNOSIS  Your caregiver will diagnose dehydration based on your symptoms and your exam. Blood and urine tests will help confirm the diagnosis. The diagnostic evaluation should also identify the cause of dehydration. TREATMENT  Treatment of mild or moderate dehydration can often be done at home by increasing the amount of fluids that you drink. It is best to drink small amounts of fluid more often. Drinking too much at one time can make vomiting worse. Refer to the home care instructions below. Severe dehydration needs to be treated at the hospital where you will probably be given intravenous (IV) fluids that contain water and electrolytes. HOME CARE INSTRUCTIONS   Ask your caregiver about specific rehydration instructions.  Drink enough fluids to keep your urine clear or pale yellow.  Drink small amounts frequently if you have nausea and vomiting.  Eat as you normally do.  Avoid:  Foods or drinks high in sugar.  Carbonated  drinks.  Juice.  Extremely hot or cold fluids.  Drinks with caffeine.  Fatty, greasy foods.  Alcohol.  Tobacco.  Overeating.  Gelatin desserts.  Wash your hands well to avoid spreading bacteria and viruses.  Only take over-the-counter or prescription medicines for pain, discomfort, or fever as directed by your caregiver.  Ask your caregiver if you should continue all prescribed and over-the-counter medicines.  Keep all follow-up appointments with your caregiver. SEEK MEDICAL CARE IF:  You have abdominal pain and it increases or stays in one area (localizes).  You have a rash, stiff neck, or severe headache.  You are irritable, sleepy, or difficult to awaken.  You are weak, dizzy, or extremely thirsty. SEEK IMMEDIATE MEDICAL CARE IF:   You are unable to keep fluids down or you get worse despite treatment.  You have frequent episodes of vomiting or diarrhea.  You have blood or green matter (bile) in your vomit.  You have blood in your stool or your stool looks black and tarry.  You have not urinated in 6 to 8 hours, or you have only urinated a small amount of very dark urine.  You have a fever.  You faint. MAKE SURE YOU:   Understand these instructions.  Will watch your condition.  Will get help right away if you are not doing well or get worse. Document Released: 09/27/2005 Document Revised: 12/20/2011 Document Reviewed: 05/17/2011 ExitCare Patient Information 2015 [employer]. This information is not intended to replace advice given to you by your health care provider. Make sure you discuss any questions you have with your health care   provider.  

## 2015-03-03 ENCOUNTER — Ambulatory Visit (HOSPITAL_BASED_OUTPATIENT_CLINIC_OR_DEPARTMENT_OTHER): Payer: Medicare Other

## 2015-03-03 VITALS — BP 101/55 | HR 84 | Temp 98.2°F | Resp 20

## 2015-03-03 DIAGNOSIS — E860 Dehydration: Secondary | ICD-10-CM | POA: Diagnosis not present

## 2015-03-03 DIAGNOSIS — R112 Nausea with vomiting, unspecified: Secondary | ICD-10-CM | POA: Diagnosis not present

## 2015-03-03 DIAGNOSIS — C01 Malignant neoplasm of base of tongue: Secondary | ICD-10-CM

## 2015-03-03 DIAGNOSIS — K1231 Oral mucositis (ulcerative) due to antineoplastic therapy: Secondary | ICD-10-CM

## 2015-03-03 DIAGNOSIS — R110 Nausea: Secondary | ICD-10-CM

## 2015-03-03 MED ORDER — PROMETHAZINE HCL 25 MG/ML IJ SOLN
12.5000 mg | Freq: Once | INTRAMUSCULAR | Status: AC
  Administered 2015-03-03: 12.5 mg via INTRAVENOUS
  Filled 2015-03-03: qty 1

## 2015-03-03 MED ORDER — SODIUM CHLORIDE 0.9 % IJ SOLN
10.0000 mL | INTRAMUSCULAR | Status: DC | PRN
  Administered 2015-03-03: 10 mL
  Filled 2015-03-03: qty 10

## 2015-03-03 MED ORDER — SODIUM CHLORIDE 0.9 % IV SOLN
1000.0000 mL | Freq: Once | INTRAVENOUS | Status: AC
  Administered 2015-03-03: 13:00:00 via INTRAVENOUS

## 2015-03-03 MED ORDER — HEPARIN SOD (PORK) LOCK FLUSH 100 UNIT/ML IV SOLN
500.0000 [IU] | Freq: Once | INTRAVENOUS | Status: AC | PRN
  Administered 2015-03-03: 500 [IU]
  Filled 2015-03-03: qty 5

## 2015-03-03 NOTE — Patient Instructions (Signed)
Dehydration, Adult Dehydration is when you lose more fluids from the body than you take in. Vital organs like the kidneys, brain, and heart cannot function without a proper amount of fluids and salt. Any loss of fluids from the body can cause dehydration.  CAUSES   Vomiting.  Diarrhea.  Excessive sweating.  Excessive urine output.  Fever. SYMPTOMS  Mild dehydration  Thirst.  Dry lips.  Slightly dry mouth. Moderate dehydration  Very dry mouth.  Sunken eyes.  Skin does not bounce back quickly when lightly pinched and released.  Dark urine and decreased urine production.  Decreased tear production.  Headache. Severe dehydration  Very dry mouth.  Extreme thirst.  Rapid, weak pulse (more than 100 beats per minute at rest).  Cold hands and feet.  Not able to sweat in spite of heat and temperature.  Rapid breathing.  Blue lips.  Confusion and lethargy.  Difficulty being awakened.  Minimal urine production.  No tears. DIAGNOSIS  Your caregiver will diagnose dehydration based on your symptoms and your exam. Blood and urine tests will help confirm the diagnosis. The diagnostic evaluation should also identify the cause of dehydration. TREATMENT  Treatment of mild or moderate dehydration can often be done at home by increasing the amount of fluids that you drink. It is best to drink small amounts of fluid more often. Drinking too much at one time can make vomiting worse. Refer to the home care instructions below. Severe dehydration needs to be treated at the hospital where you will probably be given intravenous (IV) fluids that contain water and electrolytes. HOME CARE INSTRUCTIONS   Ask your caregiver about specific rehydration instructions.  Drink enough fluids to keep your urine clear or pale yellow.  Drink small amounts frequently if you have nausea and vomiting.  Eat as you normally do.  Avoid:  Foods or drinks high in sugar.  Carbonated  drinks.  Juice.  Extremely hot or cold fluids.  Drinks with caffeine.  Fatty, greasy foods.  Alcohol.  Tobacco.  Overeating.  Gelatin desserts.  Wash your hands well to avoid spreading bacteria and viruses.  Only take over-the-counter or prescription medicines for pain, discomfort, or fever as directed by your caregiver.  Ask your caregiver if you should continue all prescribed and over-the-counter medicines.  Keep all follow-up appointments with your caregiver. SEEK MEDICAL CARE IF:  You have abdominal pain and it increases or stays in one area (localizes).  You have a rash, stiff neck, or severe headache.  You are irritable, sleepy, or difficult to awaken.  You are weak, dizzy, or extremely thirsty. SEEK IMMEDIATE MEDICAL CARE IF:   You are unable to keep fluids down or you get worse despite treatment.  You have frequent episodes of vomiting or diarrhea.  You have blood or green matter (bile) in your vomit.  You have blood in your stool or your stool looks black and tarry.  You have not urinated in 6 to 8 hours, or you have only urinated a small amount of very dark urine.  You have a fever.  You faint. MAKE SURE YOU:   Understand these instructions.  Will watch your condition.  Will get help right away if you are not doing well or get worse. Document Released: 09/27/2005 Document Revised: 12/20/2011 Document Reviewed: 05/17/2011 ExitCare Patient Information 2015 [employer]. This information is not intended to replace advice given to you by your health care provider. Make sure you discuss any questions you have with your health care   provider.  

## 2015-03-05 ENCOUNTER — Ambulatory Visit: Payer: Medicare Other | Admitting: Nutrition

## 2015-03-05 ENCOUNTER — Ambulatory Visit (HOSPITAL_BASED_OUTPATIENT_CLINIC_OR_DEPARTMENT_OTHER): Payer: Medicare Other

## 2015-03-05 VITALS — BP 96/60 | HR 81 | Temp 98.5°F | Resp 16

## 2015-03-05 DIAGNOSIS — K1231 Oral mucositis (ulcerative) due to antineoplastic therapy: Secondary | ICD-10-CM

## 2015-03-05 DIAGNOSIS — R110 Nausea: Secondary | ICD-10-CM

## 2015-03-05 DIAGNOSIS — E860 Dehydration: Secondary | ICD-10-CM

## 2015-03-05 DIAGNOSIS — C01 Malignant neoplasm of base of tongue: Secondary | ICD-10-CM

## 2015-03-05 MED ORDER — PROMETHAZINE HCL 25 MG/ML IJ SOLN
12.5000 mg | Freq: Once | INTRAMUSCULAR | Status: AC
  Administered 2015-03-05: 12.5 mg via INTRAVENOUS
  Filled 2015-03-05: qty 1

## 2015-03-05 MED ORDER — SODIUM CHLORIDE 0.9 % IJ SOLN
10.0000 mL | INTRAMUSCULAR | Status: DC | PRN
  Administered 2015-03-05: 10 mL
  Filled 2015-03-05: qty 10

## 2015-03-05 MED ORDER — HEPARIN SOD (PORK) LOCK FLUSH 100 UNIT/ML IV SOLN
500.0000 [IU] | Freq: Once | INTRAVENOUS | Status: AC | PRN
  Administered 2015-03-05: 500 [IU]
  Filled 2015-03-05: qty 5

## 2015-03-05 MED ORDER — SODIUM CHLORIDE 0.9 % IV SOLN
1000.0000 mL | Freq: Once | INTRAVENOUS | Status: AC
  Administered 2015-03-05: 1000 mL via INTRAVENOUS

## 2015-03-05 NOTE — Patient Instructions (Signed)

## 2015-03-05 NOTE — Progress Notes (Signed)
Nutrition follow-up completed with patient. Patient states he has much more energy than last week. He has increased his activity level. Still complains of very severe throat pain and cannot tolerate water by mouth. Reports tolerating Osmolite 1.5 via PEG, 4 cans daily. Last weight documented was 163.1 pounds on May 16.  Nutrition diagnosis: Inadequate oral intake continues.  Intervention:  Stressed importance of patient increasing Osmolite 1.5 to goal rate of 6 cans daily to promote healing. Patient agreeable to trying to increase to 2 cans 3 times a day with water flushes between feedings. Teach back method used.  Monitoring, evaluation, goals: Patient will tolerate increased tube feeding to goal rate providing greater than 90% estimated caloric needs and promoting healing.  Next visit: Friday, June 3, during fluids.  **Disclaimer: This note was dictated with voice recognition software. Similar sounding words can inadvertently be transcribed and this note may contain transcription errors which may not have been corrected upon publication of note.**

## 2015-03-06 ENCOUNTER — Encounter: Payer: Self-pay | Admitting: Oncology

## 2015-03-07 ENCOUNTER — Ambulatory Visit (HOSPITAL_BASED_OUTPATIENT_CLINIC_OR_DEPARTMENT_OTHER): Payer: Medicare Other

## 2015-03-07 ENCOUNTER — Ambulatory Visit
Admission: RE | Admit: 2015-03-07 | Discharge: 2015-03-07 | Disposition: A | Discharge status: Home / Self Care | Payer: Medicare Other | Source: Ambulatory Visit | Attending: Radiation Oncology | Admitting: Radiation Oncology

## 2015-03-07 VITALS — BP 112/57 | HR 88 | Temp 99.1°F | Resp 20 | Wt 161.2 lb

## 2015-03-07 VITALS — BP 113/71 | HR 78 | Temp 98.3°F | Resp 16

## 2015-03-07 DIAGNOSIS — R112 Nausea with vomiting, unspecified: Secondary | ICD-10-CM | POA: Diagnosis not present

## 2015-03-07 DIAGNOSIS — C01 Malignant neoplasm of base of tongue: Secondary | ICD-10-CM

## 2015-03-07 DIAGNOSIS — R110 Nausea: Secondary | ICD-10-CM

## 2015-03-07 DIAGNOSIS — E860 Dehydration: Secondary | ICD-10-CM | POA: Diagnosis not present

## 2015-03-07 DIAGNOSIS — K1231 Oral mucositis (ulcerative) due to antineoplastic therapy: Secondary | ICD-10-CM

## 2015-03-07 MED ORDER — PROMETHAZINE HCL 25 MG/ML IJ SOLN
12.5000 mg | Freq: Once | INTRAMUSCULAR | Status: AC
  Administered 2015-03-07: 12.5 mg via INTRAVENOUS
  Filled 2015-03-07: qty 1

## 2015-03-07 MED ORDER — SODIUM CHLORIDE 0.9 % IJ SOLN
10.0000 mL | INTRAMUSCULAR | Status: DC | PRN
  Administered 2015-03-07: 10 mL
  Filled 2015-03-07: qty 10

## 2015-03-07 MED ORDER — HEPARIN SOD (PORK) LOCK FLUSH 100 UNIT/ML IV SOLN
500.0000 [IU] | Freq: Once | INTRAVENOUS | Status: AC | PRN
  Administered 2015-03-07: 500 [IU]
  Filled 2015-03-07: qty 5

## 2015-03-07 MED ORDER — SODIUM CHLORIDE 0.9 % IV SOLN
1000.0000 mL | Freq: Once | INTRAVENOUS | Status: AC
  Administered 2015-03-07: 13:00:00 via INTRAVENOUS

## 2015-03-07 NOTE — Progress Notes (Signed)
Pain Status: No  Weight changes, if any:   Nutritional Status a) intake:  b) using a feeding tube?: 5 to 6 cans/day c) weight changes, if any:  Wt Readings from Last 3 Encounters:  03/07/15 161 lb 3.2 oz (73.12 kg)  02/24/15 163 lb 1.6 oz (73.982 kg)  02/11/15 162 lb (73.483 kg)    Swallowing Status:  Dental (if applicable): When was last visit with dentistry Using daily   When was last ENT visit?  When is next ENT visit? No appointment.Not seen since initial diagnosis.  Other notable issues, if any: BP 112/57 mmHg  Pulse 88  Temp(Src) 99.1 F (37.3 C)  Resp 20  Wt 161 lb 3.2 oz (73.12 kg)  SpO2 100%

## 2015-03-07 NOTE — Patient Instructions (Signed)
Dehydration, Adult Dehydration is when you lose more fluids from the body than you take in. Vital organs like the kidneys, brain, and heart cannot function without a proper amount of fluids and salt. Any loss of fluids from the body can cause dehydration.  CAUSES   Vomiting.  Diarrhea.  Excessive sweating.  Excessive urine output.  Fever. SYMPTOMS  Mild dehydration  Thirst.  Dry lips.  Slightly dry mouth. Moderate dehydration  Very dry mouth.  Sunken eyes.  Skin does not bounce back quickly when lightly pinched and released.  Dark urine and decreased urine production.  Decreased tear production.  Headache. Severe dehydration  Very dry mouth.  Extreme thirst.  Rapid, weak pulse (more than 100 beats per minute at rest).  Cold hands and feet.  Not able to sweat in spite of heat and temperature.  Rapid breathing.  Blue lips.  Confusion and lethargy.  Difficulty being awakened.  Minimal urine production.  No tears. DIAGNOSIS  Your caregiver will diagnose dehydration based on your symptoms and your exam. Blood and urine tests will help confirm the diagnosis. The diagnostic evaluation should also identify the cause of dehydration. TREATMENT  Treatment of mild or moderate dehydration can often be done at home by increasing the amount of fluids that you drink. It is best to drink small amounts of fluid more often. Drinking too much at one time can make vomiting worse. Refer to the home care instructions below. Severe dehydration needs to be treated at the hospital where you will probably be given intravenous (IV) fluids that contain water and electrolytes. HOME CARE INSTRUCTIONS   Ask your caregiver about specific rehydration instructions.  Drink enough fluids to keep your urine clear or pale yellow.  Drink small amounts frequently if you have nausea and vomiting.  Eat as you normally do.  Avoid:  Foods or drinks high in sugar.  Carbonated  drinks.  Juice.  Extremely hot or cold fluids.  Drinks with caffeine.  Fatty, greasy foods.  Alcohol.  Tobacco.  Overeating.  Gelatin desserts.  Wash your hands well to avoid spreading bacteria and viruses.  Only take over-the-counter or prescription medicines for pain, discomfort, or fever as directed by your caregiver.  Ask your caregiver if you should continue all prescribed and over-the-counter medicines.  Keep all follow-up appointments with your caregiver. SEEK MEDICAL CARE IF:  You have abdominal pain and it increases or stays in one area (localizes).  You have a rash, stiff neck, or severe headache.  You are irritable, sleepy, or difficult to awaken.  You are weak, dizzy, or extremely thirsty. SEEK IMMEDIATE MEDICAL CARE IF:   You are unable to keep fluids down or you get worse despite treatment.  You have frequent episodes of vomiting or diarrhea.  You have blood or green matter (bile) in your vomit.  You have blood in your stool or your stool looks black and tarry.  You have not urinated in 6 to 8 hours, or you have only urinated a small amount of very dark urine.  You have a fever.  You faint. MAKE SURE YOU:   Understand these instructions.  Will watch your condition.  Will get help right away if you are not doing well or get worse. Document Released: 09/27/2005 Document Revised: 12/20/2011 Document Reviewed: 05/17/2011 ExitCare Patient Information 2015 [employer]. This information is not intended to replace advice given to you by your health care provider. Make sure you discuss any questions you have with your health care   provider.  

## 2015-03-07 NOTE — Progress Notes (Signed)
Radiation Oncology         [phone] ________________________________  Name: Nicholas Wilkerson MRN: [id number]  Date: 03/07/2015  DOB: 03-Jul-1941  Follow-Up Visit Note  CC: Laurey Morale, MD  Melissa Montane, MD  Diagnosis and Prior Radiotherapy:       ICD-9-CM ICD-10-CM   1. 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 He is getting his energy back and is even mowing the lawn. Using biafine on his neck. Pain Status: Pain while swallowing. He is on liquid morphine (takes it 2-3 times a day) and the fentanyl patch. Nutritional Status  a) intake: None by mouth b) using a feeding tube?: Yes. 5 to 6 cans a day c) weight changes, if any: Weight is relatively stable Swallowing Status: Not  swallowing anything - has not been following SLP exercises. Dental (if applicable): Using fluoride treatments daily  When is next ENT visit? No appointment. Not seen since initial diagnosis.   ALLERGIES:  has No Known Allergies.  Meds: Current Outpatient Prescriptions  Medication Sig Dispense Refill  . brimonidine (ALPHAGAN) 0.15 % ophthalmic solution   2  . carvedilol (COREG) 25 MG tablet Take 1 tablet (25 mg total) by mouth 2 (two) times daily with a meal. 180 tablet 3  . emollient (BIAFINE) cream Apply topically as needed.    . fentaNYL (DURAGESIC - DOSED MCG/HR) 50 MCG/HR Place 1 patch (50 mcg total) onto the skin every 3 (three) days. 5 patch 0  . lactulose (CHRONULAC) 10 GM/15ML solution Place 15 mLs (10 g total)  into feeding tube 3 (three) times daily. 240 mL 6  . morphine (ROXANOL) 20 MG/ML concentrated solution Take 1 mL (20 mg total) by mouth every 2 (two) hours as needed for severe pain. 240 mL 0  . sodium fluoride (FLUORISHIELD) 1.1 % GEL dental gel Instill one drop of gel per tooth space of fluoride tray. Place over teeth for 5 minutes. Remove. Spit out excess. Repeat nightly. 120 mL prn  . tamsulosin (FLOMAX) 0.4 MG CAPS capsule Take 1 capsule (0.4 mg total) by mouth daily. 30 capsule 5  . TRAVATAN Z 0.004 % SOLN ophthalmic solution Place 1 drop into both eyes at bedtime.     . ASPIRIN PO Take 81 mg by mouth every morning.     . lidocaine (XYLOCAINE) 2 % solution Patient: Mix 1part 2% viscous lidocaine, 1part H20. Swish and/or swallow 52mL of this mixture, 59min before meals and at bedtime, up to QID (Patient not taking: Reported on 03/07/2015) 100 mL 5  . LORazepam (ATIVAN) 0.5 MG tablet Take 1 tab up to TID PRN anxiety, nausea, or claustrophobia; take 20 minutes before wearing radiotherapy mask. (Patient not taking: Reported on 03/07/2015) 60 tablet 0  . multivitamin (THERAGRAN) per tablet Take 1 tablet by mouth every morning.     . simvastatin (ZOCOR) 40 MG tablet Take 1 tablet (40 mg total) by mouth at bedtime. (Patient  not taking: Reported on 03/07/2015) 90 tablet 3  . sucralfate (CARAFATE) 1 G tablet Dissolve 1 tablet in 10 mL H20 and swallow 30 min prior to meals and bedtime. (Patient not taking: Reported on 03/07/2015) 60 tablet 5  . tadalafil (CIALIS) 20 MG tablet Take 1 tablet (20 mg total) by mouth daily as needed for erectile dysfunction. (Patient not taking: Reported on 03/07/2015) 30 tablet 3   No current facility-administered medications for this encounter.    Physical Findings: The patient is in no acute distress. Patient is alert and oriented. Wt Readings from Last 3 Encounters:  03/07/15 161 lb 3.2 oz (73.12 kg)  02/24/15 163 lb 1.6 oz (73.982 kg)  02/11/15 162 lb (73.483 kg)     weight is 161 lb 3.2 oz (73.12 kg). His temperature is 99.1 F (37.3 C). His blood pressure is 112/57 and his pulse is 88. His respiration is 20 and oxygen saturation is 100%. . Erythema in oropharynx. Dry skin over neck. White coating on his tongue consistent with mucositis.  Lab Findings: Lab Results  Component Value Date   WBC 2.6* 02/11/2015   HGB 13.4 02/11/2015   HCT 41.2 02/11/2015   MCV 87.0 02/11/2015   PLT 317 02/11/2015    Lab Results  Component Value Date   TSH 0.676 12/16/2014    Radiographic Findings: No results found.  Impression/Plan:    1) Head and Neck Cancer Status: healing from ChRT  2) Nutritional Status: - weight: Stable - PEG tube: Yes  3) Risk Factors: The patient has been educated about risk factors including alcohol and tobacco abuse; they understand that avoidance of alcohol and tobacco is important to prevent recurrences as well as other cancers  4) Swallowing: not following SLP exercises, not swallowing. He has cancelled SLP appts. We discussed the possible repercussions of this.  Refer back to SLP.  I am hopeful his throat pain will improve next week. If not, he can discuss narcotic dosing with Dr Alvy Bimler   5) Dental: Encouraged to continue regular followup with dentistry, and dental hygiene including fluoride rinses.  6) Thyroid function: Recheck in 3 months.  7) Social: He is supported by his wife.  8) Other: Schedule the pt to see a speech language therapist. IVF fluids per med/onc.  Biafine over neck.  9)  Will arrrange follow-up in early Sept.with TSH and a PET scan. I will then follow him with ENT,  long term.  Dr. Alvy Bimler is following him closely until them. The patient was encouraged to call with any issues or questions before then.   This document serves as a record of services personally performed by Eppie Gibson, MD. It was created on her behalf by Darcus Austin, a trained medical scribe. The creation of this record is based on the  scribe's personal observations and the provider's statements to them. This document has been checked and approved by the attending provider.     _____________________________________   Eppie Gibson, MD

## 2015-03-11 ENCOUNTER — Telehealth: Payer: Self-pay | Admitting: *Deleted

## 2015-03-11 ENCOUNTER — Other Ambulatory Visit: Payer: Self-pay | Admitting: Hematology and Oncology

## 2015-03-11 ENCOUNTER — Other Ambulatory Visit: Payer: Self-pay | Admitting: Radiation Oncology

## 2015-03-11 ENCOUNTER — Telehealth: Payer: Self-pay | Admitting: Radiation Oncology

## 2015-03-11 DIAGNOSIS — C01 Malignant neoplasm of base of tongue: Secondary | ICD-10-CM

## 2015-03-11 NOTE — Telephone Encounter (Signed)
Oncology Nurse Navigator Documentation  Oncology Nurse Navigator Flowsheets 03/11/2015 03/11/2015  Navigator Encounter Type Telephone Telephone - 1230  Patient returned my VM.  Indicated he needed refill for generic Zofran.  Asked that I call his wife who was enroute to [place].  Spoke with wife who stated [facility] was faxing request to Dr. Isidore Moos for Ativan and to Dr. Alvy Bimler for generic Zofran.    Time Spent with Patient 15 30

## 2015-03-11 NOTE — Telephone Encounter (Signed)
Oncology Nurse Navigator Documentation  Oncology Nurse Navigator Flowsheets 03/11/2015  Navigator Encounter Type Telephone  Returned patient's 9:28 VM in which he requested medication refill.  Unable to identify med in current list, called patient, LVM asking for clarification.  Time Spent with Patient 15

## 2015-03-11 NOTE — Telephone Encounter (Signed)
-----   Message from Eppie Gibson, MD sent at 03/11/2015  2:01 PM EDT ----- All, I got an e prescription request for Lorazepam for this pt, but e-Rx can't be done for this drug. Will someone please call in a refill for this med (exact instructions and quantity as prior Rx) to the pharmacy that it was originally filled by? thanks SS

## 2015-03-11 NOTE — Telephone Encounter (Signed)
Per Dr. Pearlie Oyster order called in Ativan to Judson Roch at [facility]. Called wife, Antony Salmon, informing her this was done.

## 2015-03-11 NOTE — Telephone Encounter (Signed)
CALLED PATIENT TO INFORM OF APPT. WITH CARL SCHINKE ON 03-13-15 - ARRIVAL TIME- 2:30 PM @ [facility] [address] AND HIS PET AND LAB ON 06-17-15 AND HIS FU WITH DR. Isidore Moos ON 06-20-15, LVM FOR A RETURN CALL

## 2015-03-12 ENCOUNTER — Other Ambulatory Visit (HOSPITAL_BASED_OUTPATIENT_CLINIC_OR_DEPARTMENT_OTHER): Payer: Medicare Other

## 2015-03-12 ENCOUNTER — Ambulatory Visit (HOSPITAL_BASED_OUTPATIENT_CLINIC_OR_DEPARTMENT_OTHER): Payer: Medicare Other | Admitting: Hematology and Oncology

## 2015-03-12 ENCOUNTER — Telehealth: Payer: Self-pay | Admitting: Hematology and Oncology

## 2015-03-12 ENCOUNTER — Ambulatory Visit (HOSPITAL_BASED_OUTPATIENT_CLINIC_OR_DEPARTMENT_OTHER): Payer: Medicare Other

## 2015-03-12 ENCOUNTER — Ambulatory Visit: Payer: Medicare Other

## 2015-03-12 ENCOUNTER — Encounter: Payer: Self-pay | Admitting: Hematology and Oncology

## 2015-03-12 VITALS — BP 123/76 | HR 78 | Temp 98.2°F | Resp 18 | Ht 69.0 in | Wt 153.5 lb

## 2015-03-12 VITALS — BP 116/62 | HR 83 | Resp 18

## 2015-03-12 DIAGNOSIS — R112 Nausea with vomiting, unspecified: Secondary | ICD-10-CM

## 2015-03-12 DIAGNOSIS — T451X5A Adverse effect of antineoplastic and immunosuppressive drugs, initial encounter: Secondary | ICD-10-CM

## 2015-03-12 DIAGNOSIS — K1231 Oral mucositis (ulcerative) due to antineoplastic therapy: Secondary | ICD-10-CM

## 2015-03-12 DIAGNOSIS — E860 Dehydration: Secondary | ICD-10-CM

## 2015-03-12 DIAGNOSIS — C01 Malignant neoplasm of base of tongue: Secondary | ICD-10-CM

## 2015-03-12 DIAGNOSIS — R634 Abnormal weight loss: Secondary | ICD-10-CM

## 2015-03-12 DIAGNOSIS — Z95828 Presence of other vascular implants and grafts: Secondary | ICD-10-CM

## 2015-03-12 DIAGNOSIS — R110 Nausea: Secondary | ICD-10-CM

## 2015-03-12 LAB — CBC WITH DIFFERENTIAL/PLATELET
BASO%: 0.9 % (ref 0.0–2.0)
Basophils Absolute: 0 10*3/uL (ref 0.0–0.1)
EOS%: 0.9 % (ref 0.0–7.0)
Eosinophils Absolute: 0.1 10*3/uL (ref 0.0–0.5)
HCT: 40 % (ref 38.4–49.9)
HGB: 13.2 g/dL (ref 13.0–17.1)
LYMPH#: 0.6 10*3/uL — AB (ref 0.9–3.3)
LYMPH%: 10.4 % — ABNORMAL LOW (ref 14.0–49.0)
MCH: 29.2 pg (ref 27.2–33.4)
MCHC: 33.1 g/dL (ref 32.0–36.0)
MCV: 88.2 fL (ref 79.3–98.0)
MONO#: 0.7 10*3/uL (ref 0.1–0.9)
MONO%: 11.9 % (ref 0.0–14.0)
NEUT#: 4.2 10*3/uL (ref 1.5–6.5)
NEUT%: 75.9 % — ABNORMAL HIGH (ref 39.0–75.0)
Platelets: 205 10*3/uL (ref 140–400)
RBC: 4.53 10*6/uL (ref 4.20–5.82)
RDW: 15.4 % — AB (ref 11.0–14.6)
WBC: 5.5 10*3/uL (ref 4.0–10.3)

## 2015-03-12 LAB — COMPREHENSIVE METABOLIC PANEL (CC13)
ALT: 19 U/L (ref 0–55)
AST: 18 U/L (ref 5–34)
Albumin: 3.2 g/dL — ABNORMAL LOW (ref 3.5–5.0)
Alkaline Phosphatase: 84 U/L (ref 40–150)
Anion Gap: 10 mEq/L (ref 3–11)
BILIRUBIN TOTAL: 0.31 mg/dL (ref 0.20–1.20)
BUN: 16.1 mg/dL (ref 7.0–26.0)
CO2: 28 mEq/L (ref 22–29)
Calcium: 9.2 mg/dL (ref 8.4–10.4)
Chloride: 98 mEq/L (ref 98–109)
Creatinine: 0.9 mg/dL (ref 0.7–1.3)
EGFR: 87 mL/min/{1.73_m2} — ABNORMAL LOW (ref >90)
Glucose: 149 mg/dl — ABNORMAL HIGH (ref 70–140)
POTASSIUM: 4.1 meq/L (ref 3.5–5.1)
Sodium: 136 mEq/L (ref 136–145)
Total Protein: 6.2 g/dL — ABNORMAL LOW (ref 6.4–8.3)

## 2015-03-12 LAB — MAGNESIUM (CC13): Magnesium: 2.6 mg/dl — ABNORMAL HIGH (ref 1.5–2.5)

## 2015-03-12 MED ORDER — SODIUM CHLORIDE 0.9 % IJ SOLN
10.0000 mL | INTRAMUSCULAR | Status: DC | PRN
  Administered 2015-03-12: 10 mL
  Filled 2015-03-12: qty 10

## 2015-03-12 MED ORDER — FENTANYL 25 MCG/HR TD PT72: 25.0000 ug | MEDICATED_PATCH | TRANSDERMAL | Status: DC

## 2015-03-12 MED ORDER — SODIUM CHLORIDE 0.9 % IJ SOLN
10.0000 mL | INTRAMUSCULAR | Status: DC | PRN
  Administered 2015-03-12: 10 mL via INTRAVENOUS
  Filled 2015-03-12: qty 10

## 2015-03-12 MED ORDER — SODIUM CHLORIDE 0.9 % IV SOLN
Freq: Once | INTRAVENOUS | Status: AC
  Administered 2015-03-12: 12:00:00 via INTRAVENOUS

## 2015-03-12 MED ORDER — HEPARIN SOD (PORK) LOCK FLUSH 100 UNIT/ML IV SOLN
500.0000 [IU] | Freq: Once | INTRAVENOUS | Status: AC | PRN
  Administered 2015-03-12: 500 [IU]
  Filled 2015-03-12: qty 5

## 2015-03-12 MED ORDER — PROMETHAZINE HCL 25 MG/ML IJ SOLN
12.5000 mg | Freq: Once | INTRAMUSCULAR | Status: AC
  Administered 2015-03-12: 12.5 mg via INTRAVENOUS
  Filled 2015-03-12: qty 1

## 2015-03-12 MED ORDER — MORPHINE SULFATE (CONCENTRATE) 20 MG/ML PO SOLN: 20.0000 mg | ORAL | Status: DC | PRN

## 2015-03-12 NOTE — Assessment & Plan Note (Signed)
He tolerated cycle one poorly due to nausea. He had significant modification to cycle 2 and cycle 3 of therapy. I recommend we continue IV fluid support for the remainder of the month. Since he is feeling better, I encouraged him to follow up with speech and language therapist about swallowing assessment, weaning him off his pain medicine and encouraged oral intake as tolerated

## 2015-03-12 NOTE — Telephone Encounter (Signed)
gv and pirnted appt sched and avs for pt for [employer]

## 2015-03-12 NOTE — Patient Instructions (Signed)
Dehydration, Adult Dehydration is when you lose more fluids from the body than you take in. Vital organs like the kidneys, brain, and heart cannot function without a proper amount of fluids and salt. Any loss of fluids from the body can cause dehydration.  CAUSES   Vomiting.  Diarrhea.  Excessive sweating.  Excessive urine output.  Fever. SYMPTOMS  Mild dehydration  Thirst.  Dry lips.  Slightly dry mouth. Moderate dehydration  Very dry mouth.  Sunken eyes.  Skin does not bounce back quickly when lightly pinched and released.  Dark urine and decreased urine production.  Decreased tear production.  Headache. Severe dehydration  Very dry mouth.  Extreme thirst.  Rapid, weak pulse (more than 100 beats per minute at rest).  Cold hands and feet.  Not able to sweat in spite of heat and temperature.  Rapid breathing.  Blue lips.  Confusion and lethargy.  Difficulty being awakened.  Minimal urine production.  No tears. DIAGNOSIS  Your caregiver will diagnose dehydration based on your symptoms and your exam. Blood and urine tests will help confirm the diagnosis. The diagnostic evaluation should also identify the cause of dehydration. TREATMENT  Treatment of mild or moderate dehydration can often be done at home by increasing the amount of fluids that you drink. It is best to drink small amounts of fluid more often. Drinking too much at one time can make vomiting worse. Refer to the home care instructions below. Severe dehydration needs to be treated at the hospital where you will probably be given intravenous (IV) fluids that contain water and electrolytes. HOME CARE INSTRUCTIONS   Ask your caregiver about specific rehydration instructions.  Drink enough fluids to keep your urine clear or pale yellow.  Drink small amounts frequently if you have nausea and vomiting.  Eat as you normally do.  Avoid:  Foods or drinks high in sugar.  Carbonated  drinks.  Juice.  Extremely hot or cold fluids.  Drinks with caffeine.  Fatty, greasy foods.  Alcohol.  Tobacco.  Overeating.  Gelatin desserts.  Wash your hands well to avoid spreading bacteria and viruses.  Only take over-the-counter or prescription medicines for pain, discomfort, or fever as directed by your caregiver.  Ask your caregiver if you should continue all prescribed and over-the-counter medicines.  Keep all follow-up appointments with your caregiver. SEEK MEDICAL CARE IF:  You have abdominal pain and it increases or stays in one area (localizes).  You have a rash, stiff neck, or severe headache.  You are irritable, sleepy, or difficult to awaken.  You are weak, dizzy, or extremely thirsty. SEEK IMMEDIATE MEDICAL CARE IF:   You are unable to keep fluids down or you get worse despite treatment.  You have frequent episodes of vomiting or diarrhea.  You have blood or green matter (bile) in your vomit.  You have blood in your stool or your stool looks black and tarry.  You have not urinated in 6 to 8 hours, or you have only urinated a small amount of very dark urine.  You have a fever.  You faint. MAKE SURE YOU:   Understand these instructions.  Will watch your condition.  Will get help right away if you are not doing well or get worse. Document Released: 09/27/2005 Document Revised: 12/20/2011 Document Reviewed: 05/17/2011 ExitCare Patient Information 2015 [employer]. This information is not intended to replace advice given to you by your health care provider. Make sure you discuss any questions you have with your health care   provider.  

## 2015-03-12 NOTE — Assessment & Plan Note (Signed)
He is rating his pain control at 4 out of 10. I will reduce the dose of fentanyl patch and continued to encourage him to use liquid morphine as needed for breakthrough pain. I gave him prescription refill today.

## 2015-03-12 NOTE — Progress Notes (Signed)
[place] OFFICE PROGRESS NOTE  Patient Care Team: Laurey Morale, MD as PCP - General (Family Medicine) Heath Lark, MD as Consulting Physician (Hematology and Oncology)  SUMMARY OF ONCOLOGIC HISTORY: Oncology History   Cancer of base of tongue   Staging form: Lip and Oral Cavity, AJCC 7th Edition     Clinical stage from 12/03/2014: Stage IVA (T2, N2a, M0) - Signed by Heath Lark, MD on 12/13/2014 HPV negative           Cancer of base of tongue   11/20/2014 Pathology Results Accession: [id number] confirm squamous cell carcinoma.   11/20/2014 Procedure The patient underwent fine-needle aspirate of the right lymph node   12/02/2014 Imaging CT scan showed 2.2 cm the right tongue base mass with necrotic 4 cm right level II nodal metastasis with multiple bilateral small lymphadenopathy   12/10/2014 Imaging PET CT scan showed tongue base mass with right level II lymph node metastasis   12/24/2014 Procedure He has placement of port and feeding tube   01/01/2015 - 02/12/2015 Chemotherapy He received high dose cisplatin   01/01/2015 - 02/18/2015 Radiation Therapy He received concurrent radiation treatment   01/21/2015 Adverse Reaction  cycle 2 chemotherapy dose will be reduced by 25% due to intolerable side effects   02/11/2015 Adverse Reaction Cycle 3 of chemotherapy dose will be reduced to 50% due to intolerable side effects    INTERVAL HISTORY: Please see below for problem oriented charting. He is seen for supportive care visit. He felt that his mucositis pain is improving. He is able to swallow some liquids. He had still intermittent nausea and vomiting but they are overall less than before. Overall, his energy level has improved.  REVIEW OF SYSTEMS:   Constitutional: Denies fevers, chills or abnormal weight loss Eyes: Denies blurriness of vision Respiratory: Denies cough, dyspnea or wheezes Cardiovascular: Denies palpitation, chest discomfort or lower extremity swelling Skin:  Denies abnormal skin rashes Lymphatics: Denies new lymphadenopathy or easy bruising Neurological:Denies numbness, tingling or new weaknesses Behavioral/Psych: Mood is stable, no new changes  All other systems were reviewed with the patient and are negative.  I have reviewed the past medical history, past surgical history, social history and family history with the patient and they are unchanged from previous note.  ALLERGIES:  has No Known Allergies.  MEDICATIONS:  Current Outpatient Prescriptions  Medication Sig Dispense Refill  . ASPIRIN PO Take 81 mg by mouth every morning.     . brimonidine (ALPHAGAN) 0.15 % ophthalmic solution   2  . carvedilol (COREG) 25 MG tablet Take 1 tablet (25 mg total) by mouth 2 (two) times daily with a meal. 180 tablet 3  . emollient (BIAFINE) cream Apply topically as needed.    . fentaNYL (DURAGESIC - DOSED MCG/HR) 25 MCG/HR patch Place 1 patch (25 mcg total) onto the skin every 3 (three) days. 5 patch 0  . lactulose (CHRONULAC) 10 GM/15ML solution Place 15 mLs (10 g total) into feeding tube 3 (three) times daily. 240 mL 6  . lidocaine (XYLOCAINE) 2 % solution Patient: Mix 1part 2% viscous lidocaine, 1part H20. Swish and/or swallow 78mL of this mixture, 28min before meals and at bedtime, up to QID 100 mL 5  . LORazepam (ATIVAN) 0.5 MG tablet TAKE 1 TABLET BY MOUTH UP TO 3 TIMES A DAY AS NEEDED FOR ANXIETY, NAUSEA, OR CLAUSTROPHOBIA. TAKE 20 MINUTES BEFORE WEARING MASK. 60 tablet 0  . morphine (ROXANOL) 20 MG/ML concentrated solution Take 1 mL (20 mg  total) by mouth every 2 (two) hours as needed for severe pain. 240 mL 0  . multivitamin (THERAGRAN) per tablet Take 1 tablet by mouth every morning.     . ondansetron (ZOFRAN) 8 MG tablet TAKE 1 TABLET BY MOUTH EVERY 8 HOURS AS NEEDED 60 tablet 1  . simvastatin (ZOCOR) 40 MG tablet Take 1 tablet (40 mg total) by mouth at bedtime. 90 tablet 3  . sodium fluoride (FLUORISHIELD) 1.1 % GEL dental gel Instill one drop of  gel per tooth space of fluoride tray. Place over teeth for 5 minutes. Remove. Spit out excess. Repeat nightly. 120 mL prn  . sucralfate (CARAFATE) 1 G tablet Dissolve 1 tablet in 10 mL H20 and swallow 30 min prior to meals and bedtime. 60 tablet 5  . tadalafil (CIALIS) 20 MG tablet Take 1 tablet (20 mg total) by mouth daily as needed for erectile dysfunction. 30 tablet 3  . tamsulosin (FLOMAX) 0.4 MG CAPS capsule Take 1 capsule (0.4 mg total) by mouth daily. 30 capsule 5  . TRAVATAN Z 0.004 % SOLN ophthalmic solution Place 1 drop into both eyes at bedtime.      No current facility-administered medications for this visit.   Facility-Administered Medications Ordered in Other Visits  Medication Dose Route Frequency Provider Last Rate Last Dose  . 0.9 %  sodium chloride infusion   Intravenous Once Heath Lark, MD      . heparin lock flush 100 unit/mL  500 Units Intracatheter Once PRN Heath Lark, MD      . sodium chloride 0.9 % injection 10 mL  10 mL Intracatheter PRN Heath Lark, MD        PHYSICAL EXAMINATION: ECOG PERFORMANCE STATUS: 1 - Symptomatic but completely ambulatory  Filed Vitals:   03/12/15 1045  BP: 123/76  Pulse: 78  Temp: 98.2 F (36.8 C)  Resp: 18   Filed Weights   03/12/15 1045  Weight: 153 lb 8 oz (69.627 kg)    GENERAL:alert, no distress and comfortable. He looks thinner SKIN: skin color, texture, turgor are normal, no rashes or significant lesions. Radiation dermatitis around his neck has improved. EYES: normal, Conjunctiva are pink and non-injected, sclera clear OROPHARYNX:no exudate, no erythema and tongue normal . Dry mucous membrane but no signs of ulceration or thrush NECK: supple, thyroid normal size, non-tender, without nodularity LYMPH:  no palpable lymphadenopathy in the cervical, axillary or inguinal LUNGS: clear to auscultation and percussion with normal breathing effort HEART: regular rate & rhythm and no murmurs and no lower extremity  edema ABDOMEN:abdomen soft, non-tender and normal bowel sounds. Feeding tube site looks okay Musculoskeletal:no cyanosis of digits and no clubbing  NEURO: alert & oriented x 3 with fluent speech, no focal motor/sensory deficits  LABORATORY DATA:  I have reviewed the data as listed    Component Value Date/Time   NA 136 03/12/2015 1030   NA 141 12/24/2014 0755   K 4.1 03/12/2015 1030   K 3.8 12/24/2014 0755   CL 109 12/24/2014 0755   CO2 28 03/12/2015 1030   CO2 27 12/24/2014 0755   GLUCOSE 149* 03/12/2015 1030   GLUCOSE 118* 12/24/2014 0755   BUN 16.1 03/12/2015 1030   BUN 15 12/24/2014 0755   CREATININE 0.9 03/12/2015 1030   CREATININE 0.99 12/24/2014 0755   CALCIUM 9.2 03/12/2015 1030   CALCIUM 8.9 12/24/2014 0755   PROT 6.2* 03/12/2015 1030   PROT 6.9 12/24/2014 0755   ALBUMIN 3.2* 03/12/2015 1030   ALBUMIN 4.0 12/24/2014  0755   AST 18 03/12/2015 1030   AST 21 12/24/2014 0755   ALT 19 03/12/2015 1030   ALT 25 12/24/2014 0755   ALKPHOS 84 03/12/2015 1030   ALKPHOS 63 12/24/2014 0755   BILITOT 0.31 03/12/2015 1030   BILITOT 0.7 12/24/2014 0755   GFRNONAA 80* 12/24/2014 0755   GFRAA >90 12/24/2014 0755    No results found for: SPEP, UPEP  Lab Results  Component Value Date   WBC 5.5 03/12/2015   NEUTROABS 4.2 03/12/2015   HGB 13.2 03/12/2015   HCT 40.0 03/12/2015   MCV 88.2 03/12/2015   PLT 205 03/12/2015      Chemistry      Component Value Date/Time   NA 136 03/12/2015 1030   NA 141 12/24/2014 0755   K 4.1 03/12/2015 1030   K 3.8 12/24/2014 0755   CL 109 12/24/2014 0755   CO2 28 03/12/2015 1030   CO2 27 12/24/2014 0755   BUN 16.1 03/12/2015 1030   BUN 15 12/24/2014 0755   CREATININE 0.9 03/12/2015 1030   CREATININE 0.99 12/24/2014 0755      Component Value Date/Time   CALCIUM 9.2 03/12/2015 1030   CALCIUM 8.9 12/24/2014 0755   ALKPHOS 84 03/12/2015 1030   ALKPHOS 63 12/24/2014 0755   AST 18 03/12/2015 1030   AST 21 12/24/2014 0755   ALT 19  03/12/2015 1030   ALT 25 12/24/2014 0755   BILITOT 0.31 03/12/2015 1030   BILITOT 0.7 12/24/2014 0755      ASSESSMENT & PLAN:  Cancer of base of tongue He tolerated cycle one poorly due to nausea. He had significant modification to cycle 2 and cycle 3 of therapy. I recommend we continue IV fluid support for the remainder of the month. Since he is feeling better, I encouraged him to follow up with speech and language therapist about swallowing assessment, weaning him off his pain medicine and encouraged oral intake as tolerated        Chemotherapy induced nausea and vomiting I recommend addition of IV fluids daily along with IV anti-emetics.    Dehydration His dehydration has improved with IV fluid. I encouraged oral intake as tolerated   Mucositis due to chemotherapy He is rating his pain control at 4 out of 10. I will reduce the dose of fentanyl patch and continued to encourage him to use liquid morphine as needed for breakthrough pain. I gave him prescription refill today.   Weight loss He continues to have mild, progressive weight loss He will follow with dietitian closely. He is able to tolerated five nutritional supplement per day. I encouraged him to try oral intake      No orders of the defined types were placed in this encounter.   All questions were answered. The patient knows to call the clinic with any problems, questions or concerns. No barriers to learning was detected. I spent 25 minutes counseling the patient face to face. The total time spent in the appointment was 30 minutes and more than 50% was on counseling and review of test results     [facility], [place], MD 03/12/2015 11:43 AM

## 2015-03-12 NOTE — Assessment & Plan Note (Signed)
He continues to have mild, progressive weight loss He will follow with dietitian closely. He is able to tolerated five nutritional supplement per day. I encouraged him to try oral intake

## 2015-03-12 NOTE — Assessment & Plan Note (Signed)
I recommend addition of IV fluids daily along with IV anti-emetics.

## 2015-03-12 NOTE — Assessment & Plan Note (Signed)
His dehydration has improved with IV fluid. I encouraged oral intake as tolerated

## 2015-03-12 NOTE — Patient Instructions (Signed)

## 2015-03-13 ENCOUNTER — Ambulatory Visit: Payer: Medicare Other | Attending: Radiation Oncology

## 2015-03-13 DIAGNOSIS — R1310 Dysphagia, unspecified: Secondary | ICD-10-CM | POA: Insufficient documentation

## 2015-03-13 NOTE — Therapy (Signed)
[facility] [address] [place], [place], [address] Phone: [phone]   Fax:  [phone]  Speech Language Pathology Treatment  Patient Details  Name: Nicholas Wilkerson MRN: [id number] Date of Birth: [birth year] Referring Provider:  Laurey Morale, MD  Encounter Date: 03/13/2015      End of Session - 03/13/15 1645    Visit Number 2   Number of Visits 4   Date for SLP Re-Evaluation 05/12/15   SLP Start Time 1449   SLP Stop Time  1530   SLP Time Calculation (min) 41 min   Activity Tolerance Patient tolerated treatment well      Past Medical History  Diagnosis Date  . Hypertension   . Dilated cardiomyopathy   . Hypercholesterolemia   . DIVERTICULOSIS, COLON 12/08/2007  . ARTHRITIS 10/22/2008  . History of echocardiogram 05/22/2007    EF was 45-50% / Mild concentric LV hypertrophy with mild global hypokinesis and overall mild systolic dysfunction .  Mild AV sclerosis / Mild Mitral insufficiency / compared to prior study 04/24/02 -- LV function has improved further.    . CHF (congestive heart failure)     sees Dr. Peter [name]   . BPH (benign prostatic hyperplasia)   . Squamous cell carcinoma 11/20/14    base of tongue primary  . Skin cancer     squamous cell, basal cell  . H/O asbestos exposure   . Glaucoma   . Radiation 01/01/15-02/18/15    base of tongue and bilateral neck 70 Gy    Past Surgical History  Procedure Laterality Date  . Cardiac catheterization  10/17/2001    EF estimated at 30% / moderate LV enlargement  / 1. Minimal nonobstructive atherosclerotic coronary artery disease / 2. Severe LV dysfunction with global hypokinesia consistent with dilated nonischemic cardiomyopath / 3. Moderate pulmonary hypertension  . Colonoscopy  08-15-07    per Dr. Carlean Purl, clear , repeat in 10 yrs  . Lymph node biopsy      There were no vitals filed for this visit.  Visit Diagnosis: Dysphagia - Plan: SLP plan of care  cert/re-cert      Subjective Assessment - 03/13/15 1456    Subjective "I think (the lack of swallowing) is apprehension, sometimes."   Patient is accompained by: Family member  wife               ADULT SLP TREATMENT - 03/13/15 1457    General Information   Behavior/Cognition Alert;Cooperative;Pleasant mood   Treatment Provided   Treatment provided Dysphagia   Dysphagia Treatment   Temperature Spikes Noted No   Treatment Methods Therapeutic exercise;Skilled observation   Patient observed directly with PO's Yes   Type of PO's observed Thin liquids   Other treatment/comments Pt has recently been attempting to swallow his saliva ("It's so thick")  Pt recently ate ice cream when pain meds were at peak. SLP encouraged pt to cont to try POs and to perform HEP at those times. Exercises completed with consistent demo cues. Pt admitted to not completing HEP in approx 3-4 weeks. SLP req'd to provide pt with mod A to why he was completing HEP. SLP educated pt on the importance of a food journal. Pt was able to tell this back to SLP 15 minutes later, and pt also able to tell SLP why he was completing HEP after 15 minutes.   Pain Assessment   Pain Assessment No/denies pain   Assessment / Recommendations / Plan   Plan  Continue with current plan of care   Dysphagia Recommendations   Diet recommendations --  as tolerated, likely dys I with thin liquids   Progression Toward Goals   Progression toward goals Progressing toward goals          SLP Education - 03/13/15 1645    Education provided Yes   Education Details food journal, HEP, muscle fibrosis/atrophy   Person(s) Educated Patient   Methods Explanation;Demonstration;Verbal cues   Comprehension Verbalized understanding;Returned demonstration          SLP Short Term Goals - 03/13/15 1648    SLP SHORT TERM GOAL #1   Title pt will complete HEP with rare min A   Time 1   Period --  visit   Status On-going   SLP SHORT TERM  GOAL #2   Title pt will tell SLP why he is completing HEP   Time 1   Period --  visit   Status Achieved          SLP Long Term Goals - 03/13/15 1648    SLP LONG TERM GOAL #1   Title pt will complete HEP with modified independence over two sessions   Time 2   Period --  visits   Status On-going  goal not met 03-13-15   SLP LONG TERM GOAL #2   Title pt will tell SLP three signs/symptoms aspiratiion PNA with modified independence   Time 2   Period --  visits   Status On-going  goal not met 03-13-15          Plan - 03/13/15 1646    Clinical Impression Statement Pt presents with pharyngeal dysphagia due to healing from rad tx. Pt aware of his need to complete HEP as directed. Skilled st to continue to assess swallowing safety with POs as well as assess performance with HEP   Speech Therapy Frequency --  approx every 4 weeks   Duration --  60 days   Treatment/Interventions Pharyngeal strengthening exercises;Oral motor exercises;Compensatory strategies;Patient/family education;SLP instruction and feedback   Potential to Achieve Goals Good        Problem List Patient Active Problem List   Diagnosis Date Noted  . Dehydration 01/24/2015  . Palpitations 01/24/2015  . Leukopenia due to antineoplastic chemotherapy 01/21/2015  . Mucositis due to chemotherapy 01/14/2015  . Chemotherapy induced nausea and vomiting 01/07/2015  . Weight loss 01/07/2015  . History of skin cancer 12/03/2014  . Cancer of base of tongue 12/02/2014  . Chronic systolic CHF (congestive heart failure) 07/12/2013  . MUSCLE STRAIN, ABDOMINAL WALL 11/28/2008  . ARTHRITIS 10/22/2008  . BURSITIS, LEFT SHOULDER 07/24/2008  . INTERNAL HEMORRHOIDS 12/08/2007  . EXTERNAL HEMORRHOIDS 12/08/2007  . DIVERTICULOSIS, COLON 12/08/2007  . RECTAL BLEEDING 12/08/2007  . CONGESTIVE HEART FAILURE, HX OF 12/08/2007  . Congestive dilated cardiomyopathy 06/13/2007  . HX, PERSONAL, ARTHRITIS 06/13/2007  .  Hypercholesterolemia 05/15/2007  . Essential hypertension 05/15/2007    [facility] , [place], [place]  03/13/2015, 4:50 PM  [facility] [address] [place], [place], [address] Phone: [phone]   Fax:  [phone]

## 2015-03-13 NOTE — Patient Instructions (Signed)
Complete exercises x2-3/day until mid November then x2-3 per week.  Consider keeping a food journal like we discussed today.

## 2015-03-14 ENCOUNTER — Ambulatory Visit (HOSPITAL_BASED_OUTPATIENT_CLINIC_OR_DEPARTMENT_OTHER): Payer: Medicare Other

## 2015-03-14 ENCOUNTER — Encounter: Payer: Medicare Other | Admitting: Nutrition

## 2015-03-14 ENCOUNTER — Telehealth: Payer: Self-pay | Admitting: *Deleted

## 2015-03-14 VITALS — BP 109/63 | HR 84 | Temp 98.2°F | Resp 17

## 2015-03-14 DIAGNOSIS — R634 Abnormal weight loss: Secondary | ICD-10-CM

## 2015-03-14 DIAGNOSIS — R112 Nausea with vomiting, unspecified: Secondary | ICD-10-CM | POA: Diagnosis not present

## 2015-03-14 DIAGNOSIS — R110 Nausea: Secondary | ICD-10-CM

## 2015-03-14 DIAGNOSIS — K1231 Oral mucositis (ulcerative) due to antineoplastic therapy: Secondary | ICD-10-CM

## 2015-03-14 DIAGNOSIS — C01 Malignant neoplasm of base of tongue: Secondary | ICD-10-CM | POA: Diagnosis not present

## 2015-03-14 DIAGNOSIS — E860 Dehydration: Secondary | ICD-10-CM

## 2015-03-14 MED ORDER — SODIUM CHLORIDE 0.9 % IV SOLN
1000.0000 mL | Freq: Once | INTRAVENOUS | Status: AC
  Administered 2015-03-14: 1000 mL via INTRAVENOUS

## 2015-03-14 MED ORDER — SODIUM CHLORIDE 0.9 % IJ SOLN
10.0000 mL | INTRAMUSCULAR | Status: DC | PRN
  Administered 2015-03-14: 10 mL
  Filled 2015-03-14: qty 10

## 2015-03-14 MED ORDER — PROMETHAZINE HCL 25 MG/ML IJ SOLN
12.5000 mg | Freq: Once | INTRAMUSCULAR | Status: AC
  Administered 2015-03-14: 12.5 mg via INTRAVENOUS
  Filled 2015-03-14: qty 1

## 2015-03-14 MED ORDER — HEPARIN SOD (PORK) LOCK FLUSH 100 UNIT/ML IV SOLN
500.0000 [IU] | Freq: Once | INTRAVENOUS | Status: AC | PRN
  Administered 2015-03-14: 500 [IU]
  Filled 2015-03-14: qty 5

## 2015-03-14 NOTE — Patient Instructions (Signed)
Dehydration, Adult Dehydration is when you lose more fluids from the body than you take in. Vital organs like the kidneys, brain, and heart cannot function without a proper amount of fluids and salt. Any loss of fluids from the body can cause dehydration.  CAUSES   Vomiting.  Diarrhea.  Excessive sweating.  Excessive urine output.  Fever. SYMPTOMS  Mild dehydration  Thirst.  Dry lips.  Slightly dry mouth. Moderate dehydration  Very dry mouth.  Sunken eyes.  Skin does not bounce back quickly when lightly pinched and released.  Dark urine and decreased urine production.  Decreased tear production.  Headache. Severe dehydration  Very dry mouth.  Extreme thirst.  Rapid, weak pulse (more than 100 beats per minute at rest).  Cold hands and feet.  Not able to sweat in spite of heat and temperature.  Rapid breathing.  Blue lips.  Confusion and lethargy.  Difficulty being awakened.  Minimal urine production.  No tears. DIAGNOSIS  Your caregiver will diagnose dehydration based on your symptoms and your exam. Blood and urine tests will help confirm the diagnosis. The diagnostic evaluation should also identify the cause of dehydration. TREATMENT  Treatment of mild or moderate dehydration can often be done at home by increasing the amount of fluids that you drink. It is best to drink small amounts of fluid more often. Drinking too much at one time can make vomiting worse. Refer to the home care instructions below. Severe dehydration needs to be treated at the hospital where you will probably be given intravenous (IV) fluids that contain water and electrolytes. HOME CARE INSTRUCTIONS   Ask your caregiver about specific rehydration instructions.  Drink enough fluids to keep your urine clear or pale yellow.  Drink small amounts frequently if you have nausea and vomiting.  Eat as you normally do.  Avoid:  Foods or drinks high in sugar.  Carbonated  drinks.  Juice.  Extremely hot or cold fluids.  Drinks with caffeine.  Fatty, greasy foods.  Alcohol.  Tobacco.  Overeating.  Gelatin desserts.  Wash your hands well to avoid spreading bacteria and viruses.  Only take over-the-counter or prescription medicines for pain, discomfort, or fever as directed by your caregiver.  Ask your caregiver if you should continue all prescribed and over-the-counter medicines.  Keep all follow-up appointments with your caregiver. SEEK MEDICAL CARE IF:  You have abdominal pain and it increases or stays in one area (localizes).  You have a rash, stiff neck, or severe headache.  You are irritable, sleepy, or difficult to awaken.  You are weak, dizzy, or extremely thirsty. SEEK IMMEDIATE MEDICAL CARE IF:   You are unable to keep fluids down or you get worse despite treatment.  You have frequent episodes of vomiting or diarrhea.  You have blood or green matter (bile) in your vomit.  You have blood in your stool or your stool looks black and tarry.  You have not urinated in 6 to 8 hours, or you have only urinated a small amount of very dark urine.  You have a fever.  You faint. MAKE SURE YOU:   Understand these instructions.  Will watch your condition.  Will get help right away if you are not doing well or get worse. Document Released: 09/27/2005 Document Revised: 12/20/2011 Document Reviewed: 05/17/2011 ExitCare Patient Information 2015 [employer]. This information is not intended to replace advice given to you by your health care provider. Make sure you discuss any questions you have with your health care   provider.  

## 2015-03-14 NOTE — Telephone Encounter (Signed)
CALLED PATIENT TO INFORM OF NEURO REHAB APPT., LVM FOR A RETURN CALL

## 2015-03-17 ENCOUNTER — Ambulatory Visit (HOSPITAL_BASED_OUTPATIENT_CLINIC_OR_DEPARTMENT_OTHER): Payer: Medicare Other

## 2015-03-17 ENCOUNTER — Ambulatory Visit: Payer: Medicare Other | Admitting: Nutrition

## 2015-03-17 VITALS — BP 99/59 | HR 77 | Temp 98.5°F | Resp 16

## 2015-03-17 DIAGNOSIS — R110 Nausea: Secondary | ICD-10-CM

## 2015-03-17 DIAGNOSIS — E860 Dehydration: Secondary | ICD-10-CM

## 2015-03-17 DIAGNOSIS — C01 Malignant neoplasm of base of tongue: Secondary | ICD-10-CM

## 2015-03-17 DIAGNOSIS — R112 Nausea with vomiting, unspecified: Secondary | ICD-10-CM | POA: Diagnosis not present

## 2015-03-17 DIAGNOSIS — K1231 Oral mucositis (ulcerative) due to antineoplastic therapy: Secondary | ICD-10-CM

## 2015-03-17 MED ORDER — HEPARIN SOD (PORK) LOCK FLUSH 100 UNIT/ML IV SOLN
500.0000 [IU] | Freq: Once | INTRAVENOUS | Status: AC | PRN
  Administered 2015-03-17: 500 [IU]
  Filled 2015-03-17: qty 5

## 2015-03-17 MED ORDER — SODIUM CHLORIDE 0.9 % IJ SOLN
10.0000 mL | INTRAMUSCULAR | Status: DC | PRN
  Administered 2015-03-17: 10 mL
  Filled 2015-03-17: qty 10

## 2015-03-17 MED ORDER — SODIUM CHLORIDE 0.9 % IV SOLN
Freq: Once | INTRAVENOUS | Status: AC
  Administered 2015-03-17: 14:00:00 via INTRAVENOUS

## 2015-03-17 MED ORDER — PROMETHAZINE HCL 25 MG/ML IJ SOLN
12.5000 mg | Freq: Once | INTRAMUSCULAR | Status: AC
  Administered 2015-03-17: 12.5 mg via INTRAVENOUS
  Filled 2015-03-17: qty 1

## 2015-03-17 NOTE — Patient Instructions (Signed)
Dehydration, Adult Dehydration is when you lose more fluids from the body than you take in. Vital organs like the kidneys, brain, and heart cannot function without a proper amount of fluids and salt. Any loss of fluids from the body can cause dehydration.  CAUSES   Vomiting.  Diarrhea.  Excessive sweating.  Excessive urine output.  Fever. SYMPTOMS  Mild dehydration  Thirst.  Dry lips.  Slightly dry mouth. Moderate dehydration  Very dry mouth.  Sunken eyes.  Skin does not bounce back quickly when lightly pinched and released.  Dark urine and decreased urine production.  Decreased tear production.  Headache. Severe dehydration  Very dry mouth.  Extreme thirst.  Rapid, weak pulse (more than 100 beats per minute at rest).  Cold hands and feet.  Not able to sweat in spite of heat and temperature.  Rapid breathing.  Blue lips.  Confusion and lethargy.  Difficulty being awakened.  Minimal urine production.  No tears. DIAGNOSIS  Your caregiver will diagnose dehydration based on your symptoms and your exam. Blood and urine tests will help confirm the diagnosis. The diagnostic evaluation should also identify the cause of dehydration. TREATMENT  Treatment of mild or moderate dehydration can often be done at home by increasing the amount of fluids that you drink. It is best to drink small amounts of fluid more often. Drinking too much at one time can make vomiting worse. Refer to the home care instructions below. Severe dehydration needs to be treated at the hospital where you will probably be given intravenous (IV) fluids that contain water and electrolytes. HOME CARE INSTRUCTIONS   Ask your caregiver about specific rehydration instructions.  Drink enough fluids to keep your urine clear or pale yellow.  Drink small amounts frequently if you have nausea and vomiting.  Eat as you normally do.  Avoid:  Foods or drinks high in sugar.  Carbonated  drinks.  Juice.  Extremely hot or cold fluids.  Drinks with caffeine.  Fatty, greasy foods.  Alcohol.  Tobacco.  Overeating.  Gelatin desserts.  Wash your hands well to avoid spreading bacteria and viruses.  Only take over-the-counter or prescription medicines for pain, discomfort, or fever as directed by your caregiver.  Ask your caregiver if you should continue all prescribed and over-the-counter medicines.  Keep all follow-up appointments with your caregiver. SEEK MEDICAL CARE IF:  You have abdominal pain and it increases or stays in one area (localizes).  You have a rash, stiff neck, or severe headache.  You are irritable, sleepy, or difficult to awaken.  You are weak, dizzy, or extremely thirsty. SEEK IMMEDIATE MEDICAL CARE IF:   You are unable to keep fluids down or you get worse despite treatment.  You have frequent episodes of vomiting or diarrhea.  You have blood or green matter (bile) in your vomit.  You have blood in your stool or your stool looks black and tarry.  You have not urinated in 6 to 8 hours, or you have only urinated a small amount of very dark urine.  You have a fever.  You faint. MAKE SURE YOU:   Understand these instructions.  Will watch your condition.  Will get help right away if you are not doing well or get worse. Document Released: 09/27/2005 Document Revised: 12/20/2011 Document Reviewed: 05/17/2011 ExitCare Patient Information 2015 [employer]. This information is not intended to replace advice given to you by your health care provider. Make sure you discuss any questions you have with your health care   provider.  

## 2015-03-17 NOTE — Progress Notes (Signed)
Follow-up completed with patient status post radiation treatment for tongue cancer. Weight continues to decrease and was documented as 153.5 pounds on June 1, down from 163.1 pounds May 16. Patient reports increased fatigue.  However that seems improved a little bit. Reports he is now able to swallow some water. Reports tolerating Osmolite 1.5 via PEG 5 cans daily. Patient verbalizes goal of tube feeding is 6 cans daily but states this is difficult.  Nutrition diagnosis: Inadequate oral intake continues.  Intervention: Provided support and encouragement with guidelines on how to increase Osmolite 1.5-6 cans daily. Encouraged patient to continue water by mouth as able. Teach back method was used.  Monitoring, evaluation, goals: Patient will work to continue to increase tube feedings to goal rate of 6 cans daily Patient will drink water as tolerated by mouth.  Next visit: I'll will continue to work with patient as needed.  **Disclaimer: This note was dictated with voice recognition software. Similar sounding words can inadvertently be transcribed and this note may contain transcription errors which may not have been corrected upon publication of note.**

## 2015-03-19 ENCOUNTER — Ambulatory Visit (HOSPITAL_BASED_OUTPATIENT_CLINIC_OR_DEPARTMENT_OTHER): Payer: Medicare Other

## 2015-03-19 VITALS — BP 107/46 | HR 78 | Temp 98.3°F | Resp 18

## 2015-03-19 DIAGNOSIS — R112 Nausea with vomiting, unspecified: Secondary | ICD-10-CM

## 2015-03-19 DIAGNOSIS — R110 Nausea: Secondary | ICD-10-CM

## 2015-03-19 DIAGNOSIS — C01 Malignant neoplasm of base of tongue: Secondary | ICD-10-CM

## 2015-03-19 DIAGNOSIS — K1231 Oral mucositis (ulcerative) due to antineoplastic therapy: Secondary | ICD-10-CM

## 2015-03-19 DIAGNOSIS — E860 Dehydration: Secondary | ICD-10-CM | POA: Diagnosis not present

## 2015-03-19 MED ORDER — SODIUM CHLORIDE 0.9 % IV SOLN
Freq: Once | INTRAVENOUS | Status: AC
  Administered 2015-03-19: 13:00:00 via INTRAVENOUS

## 2015-03-19 MED ORDER — SODIUM CHLORIDE 0.9 % IJ SOLN
10.0000 mL | INTRAMUSCULAR | Status: DC | PRN
Start: 2015-03-19 — End: 2015-03-19
  Administered 2015-03-19: 10 mL
  Filled 2015-03-19: qty 10

## 2015-03-19 MED ORDER — HEPARIN SOD (PORK) LOCK FLUSH 100 UNIT/ML IV SOLN
500.0000 [IU] | Freq: Once | INTRAVENOUS | Status: AC | PRN
  Administered 2015-03-19: 500 [IU]
  Filled 2015-03-19: qty 5

## 2015-03-19 NOTE — Patient Instructions (Signed)
Dehydration, Adult Dehydration is when you lose more fluids from the body than you take in. Vital organs like the kidneys, brain, and heart cannot function without a proper amount of fluids and salt. Any loss of fluids from the body can cause dehydration.  CAUSES   Vomiting.  Diarrhea.  Excessive sweating.  Excessive urine output.  Fever. SYMPTOMS  Mild dehydration  Thirst.  Dry lips.  Slightly dry mouth. Moderate dehydration  Very dry mouth.  Sunken eyes.  Skin does not bounce back quickly when lightly pinched and released.  Dark urine and decreased urine production.  Decreased tear production.  Headache. Severe dehydration  Very dry mouth.  Extreme thirst.  Rapid, weak pulse (more than 100 beats per minute at rest).  Cold hands and feet.  Not able to sweat in spite of heat and temperature.  Rapid breathing.  Blue lips.  Confusion and lethargy.  Difficulty being awakened.  Minimal urine production.  No tears. DIAGNOSIS  Your caregiver will diagnose dehydration based on your symptoms and your exam. Blood and urine tests will help confirm the diagnosis. The diagnostic evaluation should also identify the cause of dehydration. TREATMENT  Treatment of mild or moderate dehydration can often be done at home by increasing the amount of fluids that you drink. It is best to drink small amounts of fluid more often. Drinking too much at one time can make vomiting worse. Refer to the home care instructions below. Severe dehydration needs to be treated at the hospital where you will probably be given intravenous (IV) fluids that contain water and electrolytes. HOME CARE INSTRUCTIONS   Ask your caregiver about specific rehydration instructions.  Drink enough fluids to keep your urine clear or pale yellow.  Drink small amounts frequently if you have nausea and vomiting.  Eat as you normally do.  Avoid:  Foods or drinks high in sugar.  Carbonated  drinks.  Juice.  Extremely hot or cold fluids.  Drinks with caffeine.  Fatty, greasy foods.  Alcohol.  Tobacco.  Overeating.  Gelatin desserts.  Wash your hands well to avoid spreading bacteria and viruses.  Only take over-the-counter or prescription medicines for pain, discomfort, or fever as directed by your caregiver.  Ask your caregiver if you should continue all prescribed and over-the-counter medicines.  Keep all follow-up appointments with your caregiver. SEEK MEDICAL CARE IF:  You have abdominal pain and it increases or stays in one area (localizes).  You have a rash, stiff neck, or severe headache.  You are irritable, sleepy, or difficult to awaken.  You are weak, dizzy, or extremely thirsty. SEEK IMMEDIATE MEDICAL CARE IF:   You are unable to keep fluids down or you get worse despite treatment.  You have frequent episodes of vomiting or diarrhea.  You have blood or green matter (bile) in your vomit.  You have blood in your stool or your stool looks black and tarry.  You have not urinated in 6 to 8 hours, or you have only urinated a small amount of very dark urine.  You have a fever.  You faint. MAKE SURE YOU:   Understand these instructions.  Will watch your condition.  Will get help right away if you are not doing well or get worse. Document Released: 09/27/2005 Document Revised: 12/20/2011 Document Reviewed: 05/17/2011 ExitCare Patient Information 2015 [employer]. This information is not intended to replace advice given to you by your health care provider. Make sure you discuss any questions you have with your health care   provider.  

## 2015-03-21 ENCOUNTER — Ambulatory Visit (HOSPITAL_BASED_OUTPATIENT_CLINIC_OR_DEPARTMENT_OTHER): Payer: Medicare Other

## 2015-03-21 VITALS — BP 100/70 | HR 78 | Temp 98.2°F | Resp 18

## 2015-03-21 DIAGNOSIS — K1231 Oral mucositis (ulcerative) due to antineoplastic therapy: Secondary | ICD-10-CM

## 2015-03-21 DIAGNOSIS — R112 Nausea with vomiting, unspecified: Secondary | ICD-10-CM | POA: Diagnosis not present

## 2015-03-21 DIAGNOSIS — E860 Dehydration: Secondary | ICD-10-CM

## 2015-03-21 DIAGNOSIS — R110 Nausea: Secondary | ICD-10-CM

## 2015-03-21 DIAGNOSIS — C01 Malignant neoplasm of base of tongue: Secondary | ICD-10-CM

## 2015-03-21 MED ORDER — SODIUM CHLORIDE 0.9 % IV SOLN
Freq: Once | INTRAVENOUS | Status: AC
  Administered 2015-03-21: 14:00:00 via INTRAVENOUS

## 2015-03-21 MED ORDER — SODIUM CHLORIDE 0.9 % IJ SOLN
10.0000 mL | INTRAMUSCULAR | Status: DC | PRN
  Administered 2015-03-21: 10 mL
  Filled 2015-03-21: qty 10

## 2015-03-21 MED ORDER — HEPARIN SOD (PORK) LOCK FLUSH 100 UNIT/ML IV SOLN
500.0000 [IU] | Freq: Once | INTRAVENOUS | Status: AC | PRN
  Administered 2015-03-21: 500 [IU]
  Filled 2015-03-21: qty 5

## 2015-03-21 NOTE — Patient Instructions (Signed)
Dehydration, Adult Dehydration is when you lose more fluids from the body than you take in. Vital organs like the kidneys, brain, and heart cannot function without a proper amount of fluids and salt. Any loss of fluids from the body can cause dehydration.  CAUSES   Vomiting.  Diarrhea.  Excessive sweating.  Excessive urine output.  Fever. SYMPTOMS  Mild dehydration  Thirst.  Dry lips.  Slightly dry mouth. Moderate dehydration  Very dry mouth.  Sunken eyes.  Skin does not bounce back quickly when lightly pinched and released.  Dark urine and decreased urine production.  Decreased tear production.  Headache. Severe dehydration  Very dry mouth.  Extreme thirst.  Rapid, weak pulse (more than 100 beats per minute at rest).  Cold hands and feet.  Not able to sweat in spite of heat and temperature.  Rapid breathing.  Blue lips.  Confusion and lethargy.  Difficulty being awakened.  Minimal urine production.  No tears. DIAGNOSIS  Your caregiver will diagnose dehydration based on your symptoms and your exam. Blood and urine tests will help confirm the diagnosis. The diagnostic evaluation should also identify the cause of dehydration. TREATMENT  Treatment of mild or moderate dehydration can often be done at home by increasing the amount of fluids that you drink. It is best to drink small amounts of fluid more often. Drinking too much at one time can make vomiting worse. Refer to the home care instructions below. Severe dehydration needs to be treated at the hospital where you will probably be given intravenous (IV) fluids that contain water and electrolytes. HOME CARE INSTRUCTIONS   Ask your caregiver about specific rehydration instructions.  Drink enough fluids to keep your urine clear or pale yellow.  Drink small amounts frequently if you have nausea and vomiting.  Eat as you normally do.  Avoid:  Foods or drinks high in sugar.  Carbonated  drinks.  Juice.  Extremely hot or cold fluids.  Drinks with caffeine.  Fatty, greasy foods.  Alcohol.  Tobacco.  Overeating.  Gelatin desserts.  Wash your hands well to avoid spreading bacteria and viruses.  Only take over-the-counter or prescription medicines for pain, discomfort, or fever as directed by your caregiver.  Ask your caregiver if you should continue all prescribed and over-the-counter medicines.  Keep all follow-up appointments with your caregiver. SEEK MEDICAL CARE IF:  You have abdominal pain and it increases or stays in one area (localizes).  You have a rash, stiff neck, or severe headache.  You are irritable, sleepy, or difficult to awaken.  You are weak, dizzy, or extremely thirsty. SEEK IMMEDIATE MEDICAL CARE IF:   You are unable to keep fluids down or you get worse despite treatment.  You have frequent episodes of vomiting or diarrhea.  You have blood or green matter (bile) in your vomit.  You have blood in your stool or your stool looks black and tarry.  You have not urinated in 6 to 8 hours, or you have only urinated a small amount of very dark urine.  You have a fever.  You faint. MAKE SURE YOU:   Understand these instructions.  Will watch your condition.  Will get help right away if you are not doing well or get worse. Document Released: 09/27/2005 Document Revised: 12/20/2011 Document Reviewed: 05/17/2011 ExitCare Patient Information 2015 [employer]. This information is not intended to replace advice given to you by your health care provider. Make sure you discuss any questions you have with your health care   provider.  

## 2015-03-24 ENCOUNTER — Ambulatory Visit (HOSPITAL_COMMUNITY): Payer: Medicaid - Dental | Admitting: Dentistry

## 2015-03-24 ENCOUNTER — Ambulatory Visit: Payer: Medicare Other | Admitting: Nutrition

## 2015-03-24 ENCOUNTER — Ambulatory Visit (HOSPITAL_BASED_OUTPATIENT_CLINIC_OR_DEPARTMENT_OTHER): Payer: Medicare Other | Admitting: Hematology and Oncology

## 2015-03-24 ENCOUNTER — Telehealth: Payer: Self-pay | Admitting: Hematology and Oncology

## 2015-03-24 ENCOUNTER — Encounter (HOSPITAL_COMMUNITY): Payer: Self-pay | Admitting: Dentistry

## 2015-03-24 ENCOUNTER — Encounter: Payer: Self-pay | Admitting: Hematology and Oncology

## 2015-03-24 ENCOUNTER — Ambulatory Visit (HOSPITAL_BASED_OUTPATIENT_CLINIC_OR_DEPARTMENT_OTHER): Payer: Medicare Other

## 2015-03-24 VITALS — BP 127/70 | HR 75 | Temp 97.8°F | Wt 162.0 lb

## 2015-03-24 VITALS — BP 124/70 | HR 78 | Temp 98.0°F | Resp 18 | Ht 69.0 in | Wt 160.7 lb

## 2015-03-24 DIAGNOSIS — Z9221 Personal history of antineoplastic chemotherapy: Secondary | ICD-10-CM

## 2015-03-24 DIAGNOSIS — C01 Malignant neoplasm of base of tongue: Secondary | ICD-10-CM

## 2015-03-24 DIAGNOSIS — F329 Major depressive disorder, single episode, unspecified: Secondary | ICD-10-CM | POA: Insufficient documentation

## 2015-03-24 DIAGNOSIS — R1310 Dysphagia, unspecified: Secondary | ICD-10-CM

## 2015-03-24 DIAGNOSIS — R432 Parageusia: Secondary | ICD-10-CM

## 2015-03-24 DIAGNOSIS — R682 Dry mouth, unspecified: Secondary | ICD-10-CM

## 2015-03-24 DIAGNOSIS — K117 Disturbances of salivary secretion: Secondary | ICD-10-CM

## 2015-03-24 DIAGNOSIS — R110 Nausea: Secondary | ICD-10-CM

## 2015-03-24 DIAGNOSIS — E46 Unspecified protein-calorie malnutrition: Secondary | ICD-10-CM

## 2015-03-24 DIAGNOSIS — K1231 Oral mucositis (ulcerative) due to antineoplastic therapy: Secondary | ICD-10-CM | POA: Diagnosis not present

## 2015-03-24 DIAGNOSIS — K1233 Oral mucositis (ulcerative) due to radiation: Secondary | ICD-10-CM

## 2015-03-24 DIAGNOSIS — F32A Depression, unspecified: Secondary | ICD-10-CM | POA: Insufficient documentation

## 2015-03-24 DIAGNOSIS — E860 Dehydration: Secondary | ICD-10-CM | POA: Diagnosis not present

## 2015-03-24 DIAGNOSIS — Z923 Personal history of irradiation: Secondary | ICD-10-CM

## 2015-03-24 MED ORDER — HEPARIN SOD (PORK) LOCK FLUSH 100 UNIT/ML IV SOLN
500.0000 [IU] | Freq: Once | INTRAVENOUS | Status: AC | PRN
  Administered 2015-03-24: 500 [IU]
  Filled 2015-03-24: qty 5

## 2015-03-24 MED ORDER — MORPHINE SULFATE (CONCENTRATE) 20 MG/ML PO SOLN: 20.0000 mg | ORAL | Status: DC | PRN

## 2015-03-24 MED ORDER — OSMOLITE 1.5 CAL PO LIQD: ORAL | Status: DC

## 2015-03-24 MED ORDER — SODIUM CHLORIDE 0.9 % IJ SOLN
10.0000 mL | INTRAMUSCULAR | Status: DC | PRN
  Administered 2015-03-24: 10 mL
  Filled 2015-03-24: qty 10

## 2015-03-24 MED ORDER — SODIUM CHLORIDE 0.9 % IV SOLN
Freq: Once | INTRAVENOUS | Status: AC
  Administered 2015-03-24: 13:00:00 via INTRAVENOUS

## 2015-03-24 MED ORDER — MIRTAZAPINE 15 MG PO TABS: 15.0000 mg | ORAL_TABLET | Freq: Every day | ORAL | Status: DC

## 2015-03-24 MED ORDER — FENTANYL 25 MCG/HR TD PT72: 25.0000 ug | MEDICATED_PATCH | TRANSDERMAL | Status: DC

## 2015-03-24 NOTE — Patient Instructions (Signed)
Dehydration, Adult Dehydration is when you lose more fluids from the body than you take in. Vital organs like the kidneys, brain, and heart cannot function without a proper amount of fluids and salt. Any loss of fluids from the body can cause dehydration.  CAUSES   Vomiting.  Diarrhea.  Excessive sweating.  Excessive urine output.  Fever. SYMPTOMS  Mild dehydration  Thirst.  Dry lips.  Slightly dry mouth. Moderate dehydration  Very dry mouth.  Sunken eyes.  Skin does not bounce back quickly when lightly pinched and released.  Dark urine and decreased urine production.  Decreased tear production.  Headache. Severe dehydration  Very dry mouth.  Extreme thirst.  Rapid, weak pulse (more than 100 beats per minute at rest).  Cold hands and feet.  Not able to sweat in spite of heat and temperature.  Rapid breathing.  Blue lips.  Confusion and lethargy.  Difficulty being awakened.  Minimal urine production.  No tears. DIAGNOSIS  Your caregiver will diagnose dehydration based on your symptoms and your exam. Blood and urine tests will help confirm the diagnosis. The diagnostic evaluation should also identify the cause of dehydration. TREATMENT  Treatment of mild or moderate dehydration can often be done at home by increasing the amount of fluids that you drink. It is best to drink small amounts of fluid more often. Drinking too much at one time can make vomiting worse. Refer to the home care instructions below. Severe dehydration needs to be treated at the hospital where you will probably be given intravenous (IV) fluids that contain water and electrolytes. HOME CARE INSTRUCTIONS   Ask your caregiver about specific rehydration instructions.  Drink enough fluids to keep your urine clear or pale yellow.  Drink small amounts frequently if you have nausea and vomiting.  Eat as you normally do.  Avoid:  Foods or drinks high in sugar.  Carbonated  drinks.  Juice.  Extremely hot or cold fluids.  Drinks with caffeine.  Fatty, greasy foods.  Alcohol.  Tobacco.  Overeating.  Gelatin desserts.  Wash your hands well to avoid spreading bacteria and viruses.  Only take over-the-counter or prescription medicines for pain, discomfort, or fever as directed by your caregiver.  Ask your caregiver if you should continue all prescribed and over-the-counter medicines.  Keep all follow-up appointments with your caregiver. SEEK MEDICAL CARE IF:  You have abdominal pain and it increases or stays in one area (localizes).  You have a rash, stiff neck, or severe headache.  You are irritable, sleepy, or difficult to awaken.  You are weak, dizzy, or extremely thirsty. SEEK IMMEDIATE MEDICAL CARE IF:   You are unable to keep fluids down or you get worse despite treatment.  You have frequent episodes of vomiting or diarrhea.  You have blood or green matter (bile) in your vomit.  You have blood in your stool or your stool looks black and tarry.  You have not urinated in 6 to 8 hours, or you have only urinated a small amount of very dark urine.  You have a fever.  You faint. MAKE SURE YOU:   Understand these instructions.  Will watch your condition.  Will get help right away if you are not doing well or get worse. Document Released: 09/27/2005 Document Revised: 12/20/2011 Document Reviewed: 05/17/2011 ExitCare Patient Information 2015 [employer]. This information is not intended to replace advice given to you by your health care provider. Make sure you discuss any questions you have with your health care   provider.  

## 2015-03-24 NOTE — Assessment & Plan Note (Signed)
He is rating his pain control at 4 out of 10. I will continue the current dose of fentanyl patch and continued to encourage him to use liquid morphine as needed for breakthrough pain. I gave him prescription refill today.

## 2015-03-24 NOTE — Patient Instructions (Addendum)
RECOMMENDATIONS:  1. Brush twice a day minimally. Brush tongue daily. Use fluoride at bedtime.  2. Use trismus exercises as directed.  3. Use Biotene Rinse or salt water/baking soda rinses.  4. Multiple sips of water as needed.  5. Return to Dr. Philipp Ovens for an exam and cleaning in late August or early September 2016.  6. Do not have any teeth extracted from this point on without further discussion with Dr. Isidore Moos concerning previous radiation therapy doses and ports.   Lenn Cal, DDS       RADIATION THERAPY AND DECISIONS REGARDING YOUR TEETH  Xerostomia (dry mouth) Your salivary glands may be in the filed of radiation.  Radiation may include all or part of your saliva glands.  This will cause your saliva to dry up and you will have a dry mouth.  The dry mouth will be for the rest of your life unless your radiation oncologist tells you otherwise.  Your saliva has many functions:  Saliva wets your tongue for speaking.  It coats your teeth and the inside of your mouth for easier movement.  It helps with chewing and swallowing food.  It helps clean away harmful acid and toxic products made by the germs in your mouth, therefore it helps prevent cavities.  It kills some germs in your mouth and helps to prevent gum disease.  It helps to carry flavor to your taste buds.  Once you have lost your saliva you will be at higher risk for tooth decay and gum disease.  What can be done to help improve your mouth when there's not enough saliva:  1.  Your dentist may give a prescription for Salagen.  It will not bring back all of your saliva but may bring back some of it.  Also your saliva may be thick and ropy or white and foamy. It will not feel like it use to feel.  2.  You will need to swish with water every time your mouth feels dry.  YOU CANNOT suck on any cough drops, mints, lemon drops, candy, vitamin C or any other products.  You cannot use anything other than water to make  your mouth feel less dry.  If you want to drink anything else you have to drink it all at once and brush afterwards.  Be sure to discuss the details of your diet habits with your dentist or hygienist.  Radiation caries: This is decay that happens very quickly once your mouth is very dry due to radiation therapy.  Normally cavities take six months to two years to become a problem.  When you have dry mouth cavities may take as little as eight weeks to cause you a problem.  This is why dental check ups every two months are necessary as long as you have a dry mouth. Radiation caries typically, but not always, start at your gum line where it is hard to see the cavity.  It is therefore also hard to fill these cavities adequately.  This high rate of cavities happens because your mouth no longer has saliva and therefore the acid made by the germs starts the decay process.  Whenever you eat anything the germs in your mouth change the food into acid.  The acid then burns a small hole in your tooth.  This small hole is the beginning of a cavity.  If this is not treated then it will grow bigger and become a cavity.  The way to avoid this hole getting bigger  is to use fluoride every evening as prescribed by your dentist.  You have to make sure that your teeth are very clean before you use the fluoride.  This fluoride in turn will strengthen your teeth and prepare them for another day of fighting acid.  If you develop radiation caries many times the damage is so large that you will have to have all your teeth removed.  This could be a big problem if some of these teeth are in the field of radiation.  Further details of why this could be a big problem will follow.  (See Osteoradionecrosis).  Loss of taste (dysgeusia) This happens to varying degrees once you've had radiation therapy to your jaw region.  Many times taste is not completely lost but becomes limited.  The loss of taste is mostly due to radiation affecting your  taste buds.  However if you have no saliva in your mouth to carry the flavor to your taste buds it would be difficult for your taste buds to taste anything.  That is why using water or a prescription for Salagen prior to meals and during meals may help with some of the taste.  Keep in mind that taste generally returns very slowly over the course of several months or several years after radiation therapy.  Don't give up hope.  Trismus According to your Radiation Oncologist your TMJ or jaw joints are going to be partially or fully in the field of radiation.  This means that over time the muscles that help you open and close your mouth may get stiff.  This will potentially result in your not being able to open your mouth wide enough or as wide as you can open it now.  Le me give you an example of how slowly this happens and how unaware people are of it.  A gentlemen that had radiation therapy two years ago came back to me complaining that bananas are just too large for him to be able to fit them in between his teeth.  He was not able to open wide enough to bite into a banana.  This happens slowly and over a period of time.  What do we do to try and prevent this?  Your dentist will probably give you a stack of sticks called a trismus exercise device .  This stack will help your remind your muscles and your jaw joint to open up to the same distance every day.  Use these sticks every morning when you wake up according to the instructions given by the dentist.   You must use these sticks for at least one to two years after radiation therapy.  The reason for that is because it happens so slowly and keeps going on for about two years after radiation therapy.  Your hospital dentist will help you monitor your mouth opening and make sure that it's not getting smaller.  Osteoradionecrosis (ORN) This is a condition where your jaw bone after having had radiation therapy becomes very dry.  It has very little blood supply to  keep it alive.  If you develop a cavity that turns into an abscess or an infection then the jaw bone does not have enough blood supply to help fight the infection.  At this point it is very likely that the infection could cause the death of your jaw bone.  When you have dead bone it has to be removed.  Therefore you might end up having to have surgery to remove part  of your jaw bone, the part of the jaw bone that has been affected.   Healing is also a problem if you are to have surgery in the areas where the bone has had radiation therapy.  The same reasons apply.  If you have surgery you need more blood supply which is not available.  When blood supply and oxygen are not available again, there is a chance for the bone to die.  Occasionally ORN happens on its own with no obvious reason.  This is quite rare.  We believe that patients who continue to smoke and/or drink alcohol have a higher chance of having this bone problem.  Therefore once your jaw bone has had radiation therapy if there are any teeth in that area, you should never have them pulled.  You should also never have any surgery on your teeth or gums in that area unless the oral surgeon or Periodontist is aware of your history of radiation. There is some expensive management techniques that might be used to limit your risks.  The risks for ORN either from infection or spontaneous ( or on it's own) are life long.    TRISMUS  Trismus is a condition where the jaw does not allow the mouth to open as wide as it usually does.  This can happen almost suddenly, or in other cases the process is so slow, it is hard to notice it-until it is too far along.  When the jaw joints and/or muscles have been exposed to radiation treatments, the onset of Trismus is very slow.  This is because the muscles are losing their stretching ability over a long period of time, as long as 2 YEARS after the end of radiation.  It is therefore important to exercise these muscles  and joints.  TRISMUS EXERCISES   Stack of tongue depressors measuring the same or a little less than the last documented MIO (Maximum Interincisal Opening).  Secure them with a rubber band on both ends.  Place the stack in the patient's mouth, supporting the other end.  Allow 30 seconds for muscle stretching.  Rest for a few seconds.  Repeat 3-5 times  For all radiation patients, this exercise is recommended in the mornings and evenings unless otherwise instructed.  The exercise should be done for a period of 2 YEARS after the end of radiation.  MIO should be checked routinely on recall dental visits by the general dentist or the hospital dentist.  The patient is advised to report any changes, soreness, or difficulties encountered when doing the exercises.  FLUORIDE TRAYS PATIENT INSTRUCTIONS    Obtain prescription from the pharmacy.  Don't be surprised if it needs to be ordered.  Be sure to let the pharmacy know when you are close to needing a new refill for them to have it ready for you without interruption of Fluoride use.  The best time to use your Fluoride is before bed time.  You must brush your teeth very well and floss before using the Fluoride in order to get the best use out of the Fluoride treatments.  Place 1 drop of Fluoride gel per tooth in the tray.  Place the tray on your lower teeth and your upper teeth.  Make sure the trays are seated all the way.  Remember, they only fit one way on your teeth.  Insert for 5 full minutes.  At the end of the 5 minutes, take the trays out.  SPIT OUT excess. .  Do NOT  rinse your mouth!  Do NOT eat or drink after treatments for at least 30 minutes.  This is why the best time for your treatments is before bedtime.  Clean the inside of your Fluoride trays using COLD WATER and a toothbrush.  In order to keep your Trays from discoloring and free from odors, soak them overnight in denture cleaners such as Efferdent.  Do not  use bleach or non denture products.  Store the trays in a safe dry place AWAY from any heat until your next treatment.  If anything happens to your Fluoride trays, or they don't fit as well after any dental work, please let us know as soon as possible.

## 2015-03-24 NOTE — Assessment & Plan Note (Signed)
He is gaining weight and is able to tolerate all the nutritional supplement. I will consult dietitian to follow. I encouraged him to increase oral intake as tolerated.

## 2015-03-24 NOTE — Progress Notes (Signed)
[facility]ich[facility] OFFICE PROGRESS NOTE  Patient Care Team: Laurey Morale, MD as PCP - General (Family Medicine) Heath Lark, MD as Consulting Physician (Hematology and Oncology)  SUMMARY OF ONCOLOGIC HISTORY: Oncology History   Cancer of base of tongue   Staging form: Lip and Oral Cavity, AJCC 7th Edition     Clinical stage from 12/03/2014: Stage IVA (T2, N2a, M0) - Signed by Heath Lark, MD on 12/13/2014 HPV negative           Cancer of base of tongue   11/20/2014 Pathology Results Accession: [id number] confirm squamous cell carcinoma.   11/20/2014 Procedure The patient underwent fine-needle aspirate of the right lymph node   12/02/2014 Imaging CT scan showed 2.2 cm the right tongue base mass with necrotic 4 cm right level II nodal metastasis with multiple bilateral small lymphadenopathy   12/10/2014 Imaging PET CT scan showed tongue base mass with right level II lymph node metastasis   12/24/2014 Procedure He has placement of port and feeding tube   01/01/2015 - 02/12/2015 Chemotherapy He received high dose cisplatin   01/01/2015 - 02/18/2015 Radiation Therapy Rec'd concurrent RT to base of tongue and bilateral neck:  70 Gy in 35 fractions to gross disease, 63 Gy in 35 fractions to high risk nodal echelons, 56 Gy in 35 fractions to intermediate risk nodal echelons.   01/21/2015 Adverse Reaction  cycle 2 chemotherapy dose will be reduced by 25% due to intolerable side effects   02/11/2015 Adverse Reaction Cycle 3 of chemotherapy dose will be reduced to 50% due to intolerable side effects    INTERVAL HISTORY: Please see below for problem oriented charting. He returns for further follow-up. He is getting better. Pain is under control. He has some constipation. It is controlled with laxatives. He denies swallowing difficulties. No recent nausea or vomiting. He complained of depression but no suicidal ideation.  REVIEW OF SYSTEMS:   Constitutional: Denies fevers, chills or abnormal weight  loss Eyes: Denies blurriness of vision Ears, nose, mouth, throat, and face: Denies mucositis or sore throat Respiratory: Denies cough, dyspnea or wheezes Cardiovascular: Denies palpitation, chest discomfort or lower extremity swelling Gastrointestinal:  Denies nausea, heartburn or change in bowel habits Skin: Denies abnormal skin rashes Lymphatics: Denies new lymphadenopathy or easy bruising Neurological:Denies numbness, tingling or new weaknesses Behavioral/Psych: Mood is stable, no new changes  All other systems were reviewed with the patient and are negative.  I have reviewed the past medical history, past surgical history, social history and family history with the patient and they are unchanged from previous note.  ALLERGIES:  has No Known Allergies.  MEDICATIONS:  Current Outpatient Prescriptions  Medication Sig Dispense Refill  . ASPIRIN PO Take 81 mg by mouth every morning.     . brimonidine (ALPHAGAN) 0.15 % ophthalmic solution   2  . carvedilol (COREG) 25 MG tablet Take 1 tablet (25 mg total) by mouth 2 (two) times daily with a meal. 180 tablet 3  . emollient (BIAFINE) cream Apply topically as needed.    . fentaNYL (DURAGESIC - DOSED MCG/HR) 25 MCG/HR patch Place 1 patch (25 mcg total) onto the skin every 3 (three) days. 5 patch 0  . lactulose (CHRONULAC) 10 GM/15ML solution Place 15 mLs (10 g total) into feeding tube 3 (three) times daily. 240 mL 6  . lidocaine (XYLOCAINE) 2 % solution Patient: Mix 1part 2% viscous lidocaine, 1part H20. Swish and/or swallow 13mL of this mixture, 58min before meals and at bedtime, up  to QID 100 mL 5  . LORazepam (ATIVAN) 0.5 MG tablet TAKE 1 TABLET BY MOUTH UP TO 3 TIMES A DAY AS NEEDED FOR ANXIETY, NAUSEA, OR CLAUSTROPHOBIA. TAKE 20 MINUTES BEFORE WEARING MASK. 60 tablet 0  . mirtazapine (REMERON) 15 MG tablet Take 1 tablet (15 mg total) by mouth at bedtime. 30 tablet 3  . morphine (ROXANOL) 20 MG/ML concentrated solution Take 1 mL (20 mg  total) by mouth every 2 (two) hours as needed for severe pain. 240 mL 0  . multivitamin (THERAGRAN) per tablet Take 1 tablet by mouth every morning.     . ondansetron (ZOFRAN) 8 MG tablet TAKE 1 TABLET BY MOUTH EVERY 8 HOURS AS NEEDED 60 tablet 1  . simvastatin (ZOCOR) 40 MG tablet Take 1 tablet (40 mg total) by mouth at bedtime. 90 tablet 3  . sodium fluoride (FLUORISHIELD) 1.1 % GEL dental gel Instill one drop of gel per tooth space of fluoride tray. Place over teeth for 5 minutes. Remove. Spit out excess. Repeat nightly. 120 mL prn  . sucralfate (CARAFATE) 1 G tablet Dissolve 1 tablet in 10 mL H20 and swallow 30 min prior to meals and bedtime. 60 tablet 5  . tadalafil (CIALIS) 20 MG tablet Take 1 tablet (20 mg total) by mouth daily as needed for erectile dysfunction. 30 tablet 3  . tamsulosin (FLOMAX) 0.4 MG CAPS capsule Take 1 capsule (0.4 mg total) by mouth daily. 30 capsule 5  . TRAVATAN Z 0.004 % SOLN ophthalmic solution Place 1 drop into both eyes at bedtime.      No current facility-administered medications for this visit.    PHYSICAL EXAMINATION: ECOG PERFORMANCE STATUS: 1 - Symptomatic but completely ambulatory  Filed Vitals:   03/24/15 1203  BP: 124/70  Pulse: 78  Temp: 98 F (36.7 C)  Resp: 18   Filed Weights   03/24/15 1203  Weight: 160 lb 11.2 oz (72.893 kg)    GENERAL:alert, no distress and comfortable SKIN: skin color, texture, turgor are normal, no rashes or significant lesions EYES: normal, Conjunctiva are pink and non-injected, sclera clear OROPHARYNX:no exudate, no erythema and lips, buccal mucosa, and tongue normal. Noted dry mucous membrane. No thrush.  NECK: supple, thyroid normal size, non-tender, without nodularity Musculoskeletal:no cyanosis of digits and no clubbing  NEURO: alert & oriented x 3 with fluent speech, no focal motor/sensory deficits  LABORATORY DATA:  I have reviewed the data as listed    Component Value Date/Time   NA 136 03/12/2015  1030   NA 141 12/24/2014 0755   K 4.1 03/12/2015 1030   K 3.8 12/24/2014 0755   CL 109 12/24/2014 0755   CO2 28 03/12/2015 1030   CO2 27 12/24/2014 0755   GLUCOSE 149* 03/12/2015 1030   GLUCOSE 118* 12/24/2014 0755   BUN 16.1 03/12/2015 1030   BUN 15 12/24/2014 0755   CREATININE 0.9 03/12/2015 1030   CREATININE 0.99 12/24/2014 0755   CALCIUM 9.2 03/12/2015 1030   CALCIUM 8.9 12/24/2014 0755   PROT 6.2* 03/12/2015 1030   PROT 6.9 12/24/2014 0755   ALBUMIN 3.2* 03/12/2015 1030   ALBUMIN 4.0 12/24/2014 0755   AST 18 03/12/2015 1030   AST 21 12/24/2014 0755   ALT 19 03/12/2015 1030   ALT 25 12/24/2014 0755   ALKPHOS 84 03/12/2015 1030   ALKPHOS 63 12/24/2014 0755   BILITOT 0.31 03/12/2015 1030   BILITOT 0.7 12/24/2014 0755   GFRNONAA 80* 12/24/2014 0755   GFRAA >90 12/24/2014 0755  No results found for: SPEP, UPEP  Lab Results  Component Value Date   WBC 5.5 03/12/2015   NEUTROABS 4.2 03/12/2015   HGB 13.2 03/12/2015   HCT 40.0 03/12/2015   MCV 88.2 03/12/2015   PLT 205 03/12/2015      Chemistry      Component Value Date/Time   NA 136 03/12/2015 1030   NA 141 12/24/2014 0755   K 4.1 03/12/2015 1030   K 3.8 12/24/2014 0755   CL 109 12/24/2014 0755   CO2 28 03/12/2015 1030   CO2 27 12/24/2014 0755   BUN 16.1 03/12/2015 1030   BUN 15 12/24/2014 0755   CREATININE 0.9 03/12/2015 1030   CREATININE 0.99 12/24/2014 0755      Component Value Date/Time   CALCIUM 9.2 03/12/2015 1030   CALCIUM 8.9 12/24/2014 0755   ALKPHOS 84 03/12/2015 1030   ALKPHOS 63 12/24/2014 0755   AST 18 03/12/2015 1030   AST 21 12/24/2014 0755   ALT 19 03/12/2015 1030   ALT 25 12/24/2014 0755   BILITOT 0.31 03/12/2015 1030   BILITOT 0.7 12/24/2014 0755     ASSESSMENT & PLAN:  Cancer of base of tongue He tolerated cycle one poorly due to nausea. He had significant modification to cycle 2 and cycle 3 of therapy. I recommend we continue IV fluid support for the remainder of the  month. Since he is feeling better, I encouraged him to follow up with speech and language therapist about swallowing assessment, weaning him off his pain medicine and encouraged oral intake as tolerated     Dehydration His dehydration has improved with IV fluid. I encouraged oral intake as tolerated  Mucositis due to chemotherapy He is rating his pain control at 4 out of 10. I will continue the current dose of fentanyl patch and continued to encourage him to use liquid morphine as needed for breakthrough pain. I gave him prescription refill today.    Protein calorie malnutrition He is gaining weight and is able to tolerate all the nutritional supplement. I will consult dietitian to follow. I encouraged him to increase oral intake as tolerated.  Depression, acute He has an acute episode of depression likely related to his medical situation. He requested an antidepressives. I will give him Remeron as it might help with his sleep and appetite. I will reassess in the next visit.   No orders of the defined types were placed in this encounter.   All questions were answered. The patient knows to call the clinic with any problems, questions or concerns. No barriers to learning was detected. I spent 25 minutes counseling the patient face to face. The total time spent in the appointment was 30 minutes and more than 50% was on counseling and review of test results     [facility], [place], MD 03/24/2015 12:25 PM

## 2015-03-24 NOTE — Progress Notes (Signed)
Follow-up completed with patient who reports he has gained 7 pounds.  Weight was documented as 160 pounds today. Patient continues to tolerate Osmolite 1.5 via PEG. Patient reports he is beginning to get depressed/frustrated as he feels his progress is slow. Patient tolerating between 5 and 6 cans of Osmolite 1.5 daily. Patient is worried that he will run out of feeding.  Estimated nutrition needs: 2000-2200 cal, 105-120 grams protein, 2.2 L fluid. Scans Osmolite 1.5 provides 2130 cal, 89.4 g protein, 1086 mL free water.  Nutrition diagnosis: Inadequate oral intake continues.  Intervention: I will place order for home tube feeding to be delivered at goal rate of 6 cans Osmolite 1.5 daily through [facility]. Tube feedings with free water flushes meet greater than 90% estimated needs. Provided support and encouragement for patient to continue adequate nutrition to promote healing and repletion. Patient to continue to work to increase intake by mouth.  Monitoring, evaluation, goals: Patient will continue to work to increase calories and protein by mouth and continue tube feeding to minimize weight loss.  Next visit: I will continue to work with patient as needed.  **Disclaimer: This note was dictated with voice recognition software. Similar sounding words can inadvertently be transcribed and this note may contain transcription errors which may not have been corrected upon publication of note.**

## 2015-03-24 NOTE — Assessment & Plan Note (Signed)
He tolerated cycle one poorly due to nausea. He had significant modification to cycle 2 and cycle 3 of therapy. I recommend we continue IV fluid support for the remainder of the month. Since he is feeling better, I encouraged him to follow up with speech and language therapist about swallowing assessment, weaning him off his pain medicine and encouraged oral intake as tolerated

## 2015-03-24 NOTE — Telephone Encounter (Signed)
per pof to sch pt appt-sent MW email to sch IV-pt will come by to get updated copy of avs on 6/15

## 2015-03-24 NOTE — Assessment & Plan Note (Signed)
He has an acute episode of depression likely related to his medical situation. He requested an antidepressives. I will give him Remeron as it might help with his sleep and appetite. I will reassess in the next visit.

## 2015-03-24 NOTE — Assessment & Plan Note (Signed)
His dehydration has improved with IV fluid. I encouraged oral intake as tolerated

## 2015-03-24 NOTE — Progress Notes (Signed)
03/24/2015  Patient Name:   Nicholas Wilkerson Date of Birth:   09-14-1941 Medical Record Number: [id number]  BP 127/70 mmHg  Pulse 75  Temp(Src) 97.8 F (36.6 C) (Oral)  Wt 162 lb (73.483 kg)  [name] presents for oral examination after chemoradiation therapy. Patient has completed all radiation treatments from 01/01/2015 through 02/18/2015. Patient completed 4 cycles of chemotherapy per patient report.  REVIEW OF CHIEF COMPLAINTS:  DRY MOUTH: Yes. HARD TO SWALLOW: Yes. Patient is still not eating by mouth. HURT TO SWALLOW: Yes. TASTE CHANGES: Taste is returning slowly. SORES IN MOUTH: Back of mouth. TRISMUS: No discomfort with trismus. WEIGHT: 162 pounds. Loss of 22 pounds during treatment.  HOME OH REGIMEN:  BRUSHING: Twice a day FLOSSING: Once a day RINSING: Using salt water and baking soda rinses and biotin rinses. FLUORIDE: Using fluoride at bedtime and trays. TRISMUS EXERCISES:  Maximum interincisal opening: 36 mm.   DENTAL EXAM:  Oral Hygiene:(PLAQUE): Plaque and calculus noted in the mouth. Oral hygiene improvement was highly suggested. LOCATION OF MUCOSITIS: Back of throat and soft palate. DESCRIPTION OF SALIVA: Decreased saliva. Moderate xerostomia. ANY EXPOSED BONE: None noted OTHER WATCHED AREAS: Teeth that are near the primary field of radiation therapy. DX: Xerostomia, Dysgeusia, Dysphagia, Odynophagia, Weight Loss, Accretions and Mucositis  RECOMMENDATIONS: 1. Brush twice a day minimally. Brush tongue daily. Use fluoride at bedtime. 2. Use trismus exercises as directed. 3. Use Biotene Rinse or salt water/baking soda rinses. 4. Multiple sips of water as needed. 5. Return to Dr. Philipp Ovens for an exam and cleaning in late August or early September 2016. 6. Do not have any teeth extracted from this point on without further discussion with Dr. Isidore Moos concerning previous radiation therapy doses and ports.  Lenn Cal, DDS

## 2015-03-25 ENCOUNTER — Ambulatory Visit: Payer: Medicare Other

## 2015-03-25 DIAGNOSIS — R1310 Dysphagia, unspecified: Secondary | ICD-10-CM | POA: Diagnosis not present

## 2015-03-25 NOTE — Therapy (Signed)
[facility] [address] [place], [place], [address] Phone: [phone]   Fax:  [phone]  Speech Language Pathology Treatment  Patient Details  Name: Nicholas Wilkerson MRN: [id number] Date of Birth: 10/25/1940 Referring Provider:  Laurey Morale, MD  Encounter Date: 03/25/2015      End of Session - 03/25/15 1017    Visit Number 3   Number of Visits 4   Date for SLP Re-Evaluation 05/12/15   SLP Start Time 77   SLP Stop Time  1017   SLP Time Calculation (min) 42 min   Activity Tolerance Patient tolerated treatment well      Past Medical History  Diagnosis Date  . Hypertension   . Dilated cardiomyopathy   . Hypercholesterolemia   . DIVERTICULOSIS, COLON 12/08/2007  . ARTHRITIS 10/22/2008  . History of echocardiogram 05/22/2007    EF was 45-50% / Mild concentric LV hypertrophy with mild global hypokinesis and overall mild systolic dysfunction .  Mild AV sclerosis / Mild Mitral insufficiency / compared to prior study 04/24/02 -- LV function has improved further.    . CHF (congestive heart failure)     sees Dr. Peter [name]   . BPH (benign prostatic hyperplasia)   . Squamous cell carcinoma 11/20/14    base of tongue primary  . Skin cancer     squamous cell, basal cell  . H/O asbestos exposure   . Glaucoma   . Radiation 01/01/15-02/18/15    base of tongue and bilateral neck 70 Gy    Past Surgical History  Procedure Laterality Date  . Cardiac catheterization  10/17/2001    EF estimated at 30% / moderate LV enlargement  / 1. Minimal nonobstructive atherosclerotic coronary artery disease / 2. Severe LV dysfunction with global hypokinesia consistent with dilated nonischemic cardiomyopath / 3. Moderate pulmonary hypertension  . Colonoscopy  08-15-07    per Dr. Carlean Purl, clear , repeat in 10 yrs  . Lymph node biopsy      There were no vitals filed for this visit.  Visit Diagnosis: Dysphagia      Subjective  Assessment - 03/25/15 0956    Subjective Pt reports feeling depressed and needing "something encouraging to happen" with his post-treatment. Dr. Alvy Bimler prescribed anti-depressant at yesterday's checkup.               ADULT SLP TREATMENT - 03/25/15 1003    General Information   Behavior/Cognition Cooperative;Alert  see "S" re: mood   Treatment Provided   Treatment provided Dysphagia   Dysphagia Treatment   Temperature Spikes Noted No   Oral Phase Signs & Symptoms --  none   Pharyngeal Phase Signs & Symptoms Immediate throat clear  intermittent   Other treatment/comments Pt took 1/2 to 1 teaspoon H2O x8 with immediate throat clears x6. SLP provided encouragement to pt to try x2 POs per week at least. SLP provided suggestions of how to make POs more palatable and/or easier to pass pharyngeally.  SLp reiterated to pt that he needs to complete HEP to inhibit muscle atrophy, as he told SLP he has not copmleted HEP as directed.   Pain Assessment   Pain Assessment No/denies pain   Assessment / Recommendations / Plan   Plan Continue with current plan of care   Dysphagia Recommendations   Diet recommendations --  as tolerated   Liquids provided via Teaspoon   Compensations Small sips/bites;Slow rate;Multiple dry swallows after each bite/sip;Effortful swallow   Progression Toward Goals  Progression toward goals Progressing toward goals          SLP Education - 03/25/15 1017    Education provided Yes   Education Details food journal, ways to make POs easier to swallow   Person(s) Educated Patient   Methods Explanation;Demonstration;Verbal cues   Comprehension Verbalized understanding          SLP Short Term Goals - 03/25/15 1020    SLP SHORT TERM GOAL #1   Title pt will complete HEP with rare min A   Time 1   Period --  visit   Status On-going   SLP SHORT TERM GOAL #2   Title pt will tell SLP why he is completing HEP   Time 1   Period --  visit   Status Achieved           SLP Long Term Goals - 03/25/15 1020    SLP LONG TERM GOAL #1   Title pt will complete HEP with modified independence over two sessions   Time 2   Period --  visits   Status On-going  goal not met 03-13-15   SLP LONG TERM GOAL #2   Title pt will tell SLP three signs/symptoms aspiratiion PNA with modified independence   Time 2   Period --  visits   Status On-going  goal not met 03-13-15   SLP LONG TERM GOAL #3   Title pt to tell SLP how a food journal can assist in return to POs   Time 2   Period --  visits   Status New          Plan - 03/25/15 1019    Clinical Impression Statement Pt presents with pharyngeal dysphagia due to healing from rad tx. Pt cont aware of his need to complete HEP as directed; SLP suspects this may be hindered by probable depression. Skilled st to continue to assess swallowing safety with POs as well as assess performance with HEP   Speech Therapy Frequency --  approx every 4 weeks   Duration --  for 60 days   Treatment/Interventions Pharyngeal strengthening exercises;Oral motor exercises;Compensatory strategies;Patient/family education;SLP instruction and feedback   Potential to Achieve Goals Good        Problem List Patient Active Problem List   Diagnosis Date Noted  . Protein calorie malnutrition 03/24/2015  . Depression, acute 03/24/2015  . Dehydration 01/24/2015  . Palpitations 01/24/2015  . Leukopenia due to antineoplastic chemotherapy 01/21/2015  . Mucositis due to chemotherapy 01/14/2015  . Chemotherapy induced nausea and vomiting 01/07/2015  . Weight loss 01/07/2015  . History of skin cancer 12/03/2014  . Cancer of base of tongue 12/02/2014  . Chronic systolic CHF (congestive heart failure) 07/12/2013  . MUSCLE STRAIN, ABDOMINAL WALL 11/28/2008  . ARTHRITIS 10/22/2008  . BURSITIS, LEFT SHOULDER 07/24/2008  . INTERNAL HEMORRHOIDS 12/08/2007  . EXTERNAL HEMORRHOIDS 12/08/2007  . DIVERTICULOSIS, COLON 12/08/2007  .  RECTAL BLEEDING 12/08/2007  . CONGESTIVE HEART FAILURE, HX OF 12/08/2007  . Congestive dilated cardiomyopathy 06/13/2007  . HX, PERSONAL, ARTHRITIS 06/13/2007  . Hypercholesterolemia 05/15/2007  . Essential hypertension 05/15/2007    [facility] , Rafael Hernandez, [place]   03/25/2015, 10:22 AM  [facility] [address] [place], [place], [address] Phone: [phone]   Fax:  [phone]

## 2015-03-26 ENCOUNTER — Ambulatory Visit (HOSPITAL_BASED_OUTPATIENT_CLINIC_OR_DEPARTMENT_OTHER): Payer: Medicare Other

## 2015-03-26 VITALS — BP 127/74 | HR 72 | Temp 98.0°F | Resp 16

## 2015-03-26 DIAGNOSIS — C01 Malignant neoplasm of base of tongue: Secondary | ICD-10-CM

## 2015-03-26 DIAGNOSIS — R110 Nausea: Secondary | ICD-10-CM

## 2015-03-26 DIAGNOSIS — E860 Dehydration: Secondary | ICD-10-CM

## 2015-03-26 DIAGNOSIS — K1231 Oral mucositis (ulcerative) due to antineoplastic therapy: Secondary | ICD-10-CM

## 2015-03-26 MED ORDER — HEPARIN SOD (PORK) LOCK FLUSH 100 UNIT/ML IV SOLN
500.0000 [IU] | Freq: Once | INTRAVENOUS | Status: AC | PRN
  Administered 2015-03-26: 500 [IU]
  Filled 2015-03-26: qty 5

## 2015-03-26 MED ORDER — SODIUM CHLORIDE 0.9 % IJ SOLN
10.0000 mL | INTRAMUSCULAR | Status: DC | PRN
  Administered 2015-03-26: 10 mL
  Filled 2015-03-26: qty 10

## 2015-03-26 MED ORDER — SODIUM CHLORIDE 0.9 % IV SOLN
Freq: Once | INTRAVENOUS | Status: AC
  Administered 2015-03-26: 14:00:00 via INTRAVENOUS

## 2015-03-28 ENCOUNTER — Ambulatory Visit (HOSPITAL_BASED_OUTPATIENT_CLINIC_OR_DEPARTMENT_OTHER): Payer: Medicare Other

## 2015-03-28 VITALS — BP 112/59 | HR 71

## 2015-03-28 DIAGNOSIS — E860 Dehydration: Secondary | ICD-10-CM | POA: Diagnosis not present

## 2015-03-28 DIAGNOSIS — K1231 Oral mucositis (ulcerative) due to antineoplastic therapy: Secondary | ICD-10-CM

## 2015-03-28 DIAGNOSIS — C01 Malignant neoplasm of base of tongue: Secondary | ICD-10-CM

## 2015-03-28 DIAGNOSIS — R110 Nausea: Secondary | ICD-10-CM

## 2015-03-28 MED ORDER — SODIUM CHLORIDE 0.9 % IJ SOLN
10.0000 mL | INTRAMUSCULAR | Status: DC | PRN
  Administered 2015-03-28: 10 mL
  Filled 2015-03-28: qty 10

## 2015-03-28 MED ORDER — HEPARIN SOD (PORK) LOCK FLUSH 100 UNIT/ML IV SOLN
500.0000 [IU] | Freq: Once | INTRAVENOUS | Status: AC | PRN
  Administered 2015-03-28: 500 [IU]
  Filled 2015-03-28: qty 5

## 2015-03-28 MED ORDER — SODIUM CHLORIDE 0.9 % IV SOLN
Freq: Once | INTRAVENOUS | Status: AC
  Administered 2015-03-28: 14:00:00 via INTRAVENOUS

## 2015-03-28 NOTE — Patient Instructions (Signed)
Dehydration, Adult Dehydration is when you lose more fluids from the body than you take in. Vital organs like the kidneys, brain, and heart cannot function without a proper amount of fluids and salt. Any loss of fluids from the body can cause dehydration.  CAUSES   Vomiting.  Diarrhea.  Excessive sweating.  Excessive urine output.  Fever. SYMPTOMS  Mild dehydration  Thirst.  Dry lips.  Slightly dry mouth. Moderate dehydration  Very dry mouth.  Sunken eyes.  Skin does not bounce back quickly when lightly pinched and released.  Dark urine and decreased urine production.  Decreased tear production.  Headache. Severe dehydration  Very dry mouth.  Extreme thirst.  Rapid, weak pulse (more than 100 beats per minute at rest).  Cold hands and feet.  Not able to sweat in spite of heat and temperature.  Rapid breathing.  Blue lips.  Confusion and lethargy.  Difficulty being awakened.  Minimal urine production.  No tears. DIAGNOSIS  Your caregiver will diagnose dehydration based on your symptoms and your exam. Blood and urine tests will help confirm the diagnosis. The diagnostic evaluation should also identify the cause of dehydration. TREATMENT  Treatment of mild or moderate dehydration can often be done at home by increasing the amount of fluids that you drink. It is best to drink small amounts of fluid more often. Drinking too much at one time can make vomiting worse. Refer to the home care instructions below. Severe dehydration needs to be treated at the hospital where you will probably be given intravenous (IV) fluids that contain water and electrolytes. HOME CARE INSTRUCTIONS   Ask your caregiver about specific rehydration instructions.  Drink enough fluids to keep your urine clear or pale yellow.  Drink small amounts frequently if you have nausea and vomiting.  Eat as you normally do.  Avoid:  Foods or drinks high in sugar.  Carbonated  drinks.  Juice.  Extremely hot or cold fluids.  Drinks with caffeine.  Fatty, greasy foods.  Alcohol.  Tobacco.  Overeating.  Gelatin desserts.  Wash your hands well to avoid spreading bacteria and viruses.  Only take over-the-counter or prescription medicines for pain, discomfort, or fever as directed by your caregiver.  Ask your caregiver if you should continue all prescribed and over-the-counter medicines.  Keep all follow-up appointments with your caregiver. SEEK MEDICAL CARE IF:  You have abdominal pain and it increases or stays in one area (localizes).  You have a rash, stiff neck, or severe headache.  You are irritable, sleepy, or difficult to awaken.  You are weak, dizzy, or extremely thirsty. SEEK IMMEDIATE MEDICAL CARE IF:   You are unable to keep fluids down or you get worse despite treatment.  You have frequent episodes of vomiting or diarrhea.  You have blood or green matter (bile) in your vomit.  You have blood in your stool or your stool looks black and tarry.  You have not urinated in 6 to 8 hours, or you have only urinated a small amount of very dark urine.  You have a fever.  You faint. MAKE SURE YOU:   Understand these instructions.  Will watch your condition.  Will get help right away if you are not doing well or get worse. Document Released: 09/27/2005 Document Revised: 12/20/2011 Document Reviewed: 05/17/2011 ExitCare Patient Information 2015 [employer]. This information is not intended to replace advice given to you by your health care provider. Make sure you discuss any questions you have with your health care   provider.  

## 2015-03-31 ENCOUNTER — Ambulatory Visit (HOSPITAL_BASED_OUTPATIENT_CLINIC_OR_DEPARTMENT_OTHER): Payer: Medicare Other

## 2015-03-31 VITALS — BP 109/50 | HR 74 | Resp 16

## 2015-03-31 DIAGNOSIS — R110 Nausea: Secondary | ICD-10-CM | POA: Diagnosis not present

## 2015-03-31 DIAGNOSIS — C01 Malignant neoplasm of base of tongue: Secondary | ICD-10-CM | POA: Diagnosis not present

## 2015-03-31 DIAGNOSIS — K1231 Oral mucositis (ulcerative) due to antineoplastic therapy: Secondary | ICD-10-CM | POA: Diagnosis not present

## 2015-03-31 MED ORDER — SODIUM CHLORIDE 0.9 % IJ SOLN
10.0000 mL | INTRAMUSCULAR | Status: DC | PRN
  Administered 2015-03-31: 10 mL
  Filled 2015-03-31: qty 10

## 2015-03-31 MED ORDER — SODIUM CHLORIDE 0.9 % IV SOLN
1000.0000 mL | Freq: Once | INTRAVENOUS | Status: AC
  Administered 2015-03-31: 1000 mL via INTRAVENOUS

## 2015-03-31 MED ORDER — HEPARIN SOD (PORK) LOCK FLUSH 100 UNIT/ML IV SOLN
500.0000 [IU] | Freq: Once | INTRAVENOUS | Status: AC | PRN
  Administered 2015-03-31: 500 [IU]
  Filled 2015-03-31: qty 5

## 2015-03-31 NOTE — Patient Instructions (Signed)
Dehydration, Adult Dehydration is when you lose more fluids from the body than you take in. Vital organs like the kidneys, brain, and heart cannot function without a proper amount of fluids and salt. Any loss of fluids from the body can cause dehydration.  CAUSES   Vomiting.  Diarrhea.  Excessive sweating.  Excessive urine output.  Fever. SYMPTOMS  Mild dehydration  Thirst.  Dry lips.  Slightly dry mouth. Moderate dehydration  Very dry mouth.  Sunken eyes.  Skin does not bounce back quickly when lightly pinched and released.  Dark urine and decreased urine production.  Decreased tear production.  Headache. Severe dehydration  Very dry mouth.  Extreme thirst.  Rapid, weak pulse (more than 100 beats per minute at rest).  Cold hands and feet.  Not able to sweat in spite of heat and temperature.  Rapid breathing.  Blue lips.  Confusion and lethargy.  Difficulty being awakened.  Minimal urine production.  No tears. DIAGNOSIS  Your caregiver will diagnose dehydration based on your symptoms and your exam. Blood and urine tests will help confirm the diagnosis. The diagnostic evaluation should also identify the cause of dehydration. TREATMENT  Treatment of mild or moderate dehydration can often be done at home by increasing the amount of fluids that you drink. It is best to drink small amounts of fluid more often. Drinking too much at one time can make vomiting worse. Refer to the home care instructions below. Severe dehydration needs to be treated at the hospital where you will probably be given intravenous (IV) fluids that contain water and electrolytes. HOME CARE INSTRUCTIONS   Ask your caregiver about specific rehydration instructions.  Drink enough fluids to keep your urine clear or pale yellow.  Drink small amounts frequently if you have nausea and vomiting.  Eat as you normally do.  Avoid:  Foods or drinks high in sugar.  Carbonated  drinks.  Juice.  Extremely hot or cold fluids.  Drinks with caffeine.  Fatty, greasy foods.  Alcohol.  Tobacco.  Overeating.  Gelatin desserts.  Wash your hands well to avoid spreading bacteria and viruses.  Only take over-the-counter or prescription medicines for pain, discomfort, or fever as directed by your caregiver.  Ask your caregiver if you should continue all prescribed and over-the-counter medicines.  Keep all follow-up appointments with your caregiver. SEEK MEDICAL CARE IF:  You have abdominal pain and it increases or stays in one area (localizes).  You have a rash, stiff neck, or severe headache.  You are irritable, sleepy, or difficult to awaken.  You are weak, dizzy, or extremely thirsty. SEEK IMMEDIATE MEDICAL CARE IF:   You are unable to keep fluids down or you get worse despite treatment.  You have frequent episodes of vomiting or diarrhea.  You have blood or green matter (bile) in your vomit.  You have blood in your stool or your stool looks black and tarry.  You have not urinated in 6 to 8 hours, or you have only urinated a small amount of very dark urine.  You have a fever.  You faint. MAKE SURE YOU:   Understand these instructions.  Will watch your condition.  Will get help right away if you are not doing well or get worse. Document Released: 09/27/2005 Document Revised: 12/20/2011 Document Reviewed: 05/17/2011 ExitCare Patient Information 2015 [employer]. This information is not intended to replace advice given to you by your health care provider. Make sure you discuss any questions you have with your health care   provider.  

## 2015-04-02 ENCOUNTER — Ambulatory Visit (HOSPITAL_BASED_OUTPATIENT_CLINIC_OR_DEPARTMENT_OTHER): Payer: Medicare Other

## 2015-04-02 VITALS — BP 122/68 | HR 72 | Temp 98.2°F | Resp 18

## 2015-04-02 DIAGNOSIS — E860 Dehydration: Secondary | ICD-10-CM

## 2015-04-02 DIAGNOSIS — K1231 Oral mucositis (ulcerative) due to antineoplastic therapy: Secondary | ICD-10-CM

## 2015-04-02 DIAGNOSIS — R110 Nausea: Secondary | ICD-10-CM

## 2015-04-02 DIAGNOSIS — C01 Malignant neoplasm of base of tongue: Secondary | ICD-10-CM | POA: Diagnosis not present

## 2015-04-02 MED ORDER — SODIUM CHLORIDE 0.9 % IJ SOLN
10.0000 mL | INTRAMUSCULAR | Status: DC | PRN
  Filled 2015-04-02: qty 10

## 2015-04-02 MED ORDER — HEPARIN SOD (PORK) LOCK FLUSH 100 UNIT/ML IV SOLN
250.0000 [IU] | Freq: Once | INTRAVENOUS | Status: DC | PRN
  Filled 2015-04-02: qty 5

## 2015-04-02 MED ORDER — SODIUM CHLORIDE 0.9 % IV SOLN
Freq: Once | INTRAVENOUS | Status: AC
  Administered 2015-04-02: 13:00:00 via INTRAVENOUS

## 2015-04-02 MED ORDER — PROMETHAZINE HCL 25 MG/ML IJ SOLN
12.5000 mg | Freq: Once | INTRAMUSCULAR | Status: DC
  Filled 2015-04-02: qty 1

## 2015-04-02 NOTE — Patient Instructions (Signed)
Dehydration, Adult Dehydration is when you lose more fluids from the body than you take in. Vital organs like the kidneys, brain, and heart cannot function without a proper amount of fluids and salt. Any loss of fluids from the body can cause dehydration.  CAUSES   Vomiting.  Diarrhea.  Excessive sweating.  Excessive urine output.  Fever. SYMPTOMS  Mild dehydration  Thirst.  Dry lips.  Slightly dry mouth. Moderate dehydration  Very dry mouth.  Sunken eyes.  Skin does not bounce back quickly when lightly pinched and released.  Dark urine and decreased urine production.  Decreased tear production.  Headache. Severe dehydration  Very dry mouth.  Extreme thirst.  Rapid, weak pulse (more than 100 beats per minute at rest).  Cold hands and feet.  Not able to sweat in spite of heat and temperature.  Rapid breathing.  Blue lips.  Confusion and lethargy.  Difficulty being awakened.  Minimal urine production.  No tears. DIAGNOSIS  Your caregiver will diagnose dehydration based on your symptoms and your exam. Blood and urine tests will help confirm the diagnosis. The diagnostic evaluation should also identify the cause of dehydration. TREATMENT  Treatment of mild or moderate dehydration can often be done at home by increasing the amount of fluids that you drink. It is best to drink small amounts of fluid more often. Drinking too much at one time can make vomiting worse. Refer to the home care instructions below. Severe dehydration needs to be treated at the hospital where you will probably be given intravenous (IV) fluids that contain water and electrolytes. HOME CARE INSTRUCTIONS   Ask your caregiver about specific rehydration instructions.  Drink enough fluids to keep your urine clear or pale yellow.  Drink small amounts frequently if you have nausea and vomiting.  Eat as you normally do.  Avoid:  Foods or drinks high in sugar.  Carbonated  drinks.  Juice.  Extremely hot or cold fluids.  Drinks with caffeine.  Fatty, greasy foods.  Alcohol.  Tobacco.  Overeating.  Gelatin desserts.  Wash your hands well to avoid spreading bacteria and viruses.  Only take over-the-counter or prescription medicines for pain, discomfort, or fever as directed by your caregiver.  Ask your caregiver if you should continue all prescribed and over-the-counter medicines.  Keep all follow-up appointments with your caregiver. SEEK MEDICAL CARE IF:  You have abdominal pain and it increases or stays in one area (localizes).  You have a rash, stiff neck, or severe headache.  You are irritable, sleepy, or difficult to awaken.  You are weak, dizzy, or extremely thirsty. SEEK IMMEDIATE MEDICAL CARE IF:   You are unable to keep fluids down or you get worse despite treatment.  You have frequent episodes of vomiting or diarrhea.  You have blood or green matter (bile) in your vomit.  You have blood in your stool or your stool looks black and tarry.  You have not urinated in 6 to 8 hours, or you have only urinated a small amount of very dark urine.  You have a fever.  You faint. MAKE SURE YOU:   Understand these instructions.  Will watch your condition.  Will get help right away if you are not doing well or get worse. Document Released: 09/27/2005 Document Revised: 12/20/2011 Document Reviewed: 05/17/2011 ExitCare Patient Information 2015 [employer]. This information is not intended to replace advice given to you by your health care provider. Make sure you discuss any questions you have with your health care   provider.  

## 2015-04-04 ENCOUNTER — Ambulatory Visit (HOSPITAL_BASED_OUTPATIENT_CLINIC_OR_DEPARTMENT_OTHER): Payer: Medicare Other

## 2015-04-04 VITALS — BP 122/54 | HR 70 | Temp 98.0°F | Resp 18

## 2015-04-04 DIAGNOSIS — C01 Malignant neoplasm of base of tongue: Secondary | ICD-10-CM | POA: Diagnosis not present

## 2015-04-04 DIAGNOSIS — K1231 Oral mucositis (ulcerative) due to antineoplastic therapy: Secondary | ICD-10-CM | POA: Diagnosis not present

## 2015-04-04 DIAGNOSIS — R110 Nausea: Secondary | ICD-10-CM

## 2015-04-04 MED ORDER — SODIUM CHLORIDE 0.9 % IJ SOLN
10.0000 mL | INTRAMUSCULAR | Status: DC | PRN
  Administered 2015-04-04: 10 mL
  Filled 2015-04-04: qty 10

## 2015-04-04 MED ORDER — SODIUM CHLORIDE 0.9 % IV SOLN
Freq: Once | INTRAVENOUS | Status: AC
  Administered 2015-04-04: 14:00:00 via INTRAVENOUS

## 2015-04-04 MED ORDER — HEPARIN SOD (PORK) LOCK FLUSH 100 UNIT/ML IV SOLN
500.0000 [IU] | Freq: Once | INTRAVENOUS | Status: AC | PRN
  Administered 2015-04-04: 500 [IU]
  Filled 2015-04-04: qty 5

## 2015-04-04 NOTE — Patient Instructions (Signed)
Dehydration, Adult Dehydration is when you lose more fluids from the body than you take in. Vital organs like the kidneys, brain, and heart cannot function without a proper amount of fluids and salt. Any loss of fluids from the body can cause dehydration.  CAUSES   Vomiting.  Diarrhea.  Excessive sweating.  Excessive urine output.  Fever. SYMPTOMS  Mild dehydration  Thirst.  Dry lips.  Slightly dry mouth. Moderate dehydration  Very dry mouth.  Sunken eyes.  Skin does not bounce back quickly when lightly pinched and released.  Dark urine and decreased urine production.  Decreased tear production.  Headache. Severe dehydration  Very dry mouth.  Extreme thirst.  Rapid, weak pulse (more than 100 beats per minute at rest).  Cold hands and feet.  Not able to sweat in spite of heat and temperature.  Rapid breathing.  Blue lips.  Confusion and lethargy.  Difficulty being awakened.  Minimal urine production.  No tears. DIAGNOSIS  Your caregiver will diagnose dehydration based on your symptoms and your exam. Blood and urine tests will help confirm the diagnosis. The diagnostic evaluation should also identify the cause of dehydration. TREATMENT  Treatment of mild or moderate dehydration can often be done at home by increasing the amount of fluids that you drink. It is best to drink small amounts of fluid more often. Drinking too much at one time can make vomiting worse. Refer to the home care instructions below. Severe dehydration needs to be treated at the hospital where you will probably be given intravenous (IV) fluids that contain water and electrolytes. HOME CARE INSTRUCTIONS   Ask your caregiver about specific rehydration instructions.  Drink enough fluids to keep your urine clear or pale yellow.  Drink small amounts frequently if you have nausea and vomiting.  Eat as you normally do.  Avoid:  Foods or drinks high in sugar.  Carbonated  drinks.  Juice.  Extremely hot or cold fluids.  Drinks with caffeine.  Fatty, greasy foods.  Alcohol.  Tobacco.  Overeating.  Gelatin desserts.  Wash your hands well to avoid spreading bacteria and viruses.  Only take over-the-counter or prescription medicines for pain, discomfort, or fever as directed by your caregiver.  Ask your caregiver if you should continue all prescribed and over-the-counter medicines.  Keep all follow-up appointments with your caregiver. SEEK MEDICAL CARE IF:  You have abdominal pain and it increases or stays in one area (localizes).  You have a rash, stiff neck, or severe headache.  You are irritable, sleepy, or difficult to awaken.  You are weak, dizzy, or extremely thirsty. SEEK IMMEDIATE MEDICAL CARE IF:   You are unable to keep fluids down or you get worse despite treatment.  You have frequent episodes of vomiting or diarrhea.  You have blood or green matter (bile) in your vomit.  You have blood in your stool or your stool looks black and tarry.  You have not urinated in 6 to 8 hours, or you have only urinated a small amount of very dark urine.  You have a fever.  You faint. MAKE SURE YOU:   Understand these instructions.  Will watch your condition.  Will get help right away if you are not doing well or get worse. Document Released: 09/27/2005 Document Revised: 12/20/2011 Document Reviewed: 05/17/2011 ExitCare Patient Information 2015 [employer]. This information is not intended to replace advice given to you by your health care provider. Make sure you discuss any questions you have with your health care   provider.  

## 2015-04-07 ENCOUNTER — Ambulatory Visit (HOSPITAL_BASED_OUTPATIENT_CLINIC_OR_DEPARTMENT_OTHER): Payer: Medicare Other

## 2015-04-07 ENCOUNTER — Other Ambulatory Visit: Payer: Self-pay

## 2015-04-07 VITALS — BP 115/52 | HR 103 | Temp 98.3°F | Resp 16

## 2015-04-07 DIAGNOSIS — C01 Malignant neoplasm of base of tongue: Secondary | ICD-10-CM | POA: Diagnosis not present

## 2015-04-07 DIAGNOSIS — R110 Nausea: Secondary | ICD-10-CM

## 2015-04-07 DIAGNOSIS — K1231 Oral mucositis (ulcerative) due to antineoplastic therapy: Secondary | ICD-10-CM | POA: Diagnosis not present

## 2015-04-07 MED ORDER — HEPARIN SOD (PORK) LOCK FLUSH 100 UNIT/ML IV SOLN
500.0000 [IU] | Freq: Once | INTRAVENOUS | Status: AC | PRN
  Administered 2015-04-07: 500 [IU]
  Filled 2015-04-07: qty 5

## 2015-04-07 MED ORDER — SODIUM CHLORIDE 0.9 % IJ SOLN
10.0000 mL | INTRAMUSCULAR | Status: DC | PRN
  Administered 2015-04-07: 10 mL
  Filled 2015-04-07: qty 10

## 2015-04-07 MED ORDER — SODIUM CHLORIDE 0.9 % IV SOLN
Freq: Once | INTRAVENOUS | Status: AC
  Administered 2015-04-07: 15:00:00 via INTRAVENOUS

## 2015-04-07 NOTE — Patient Instructions (Signed)
Dehydration, Adult Dehydration is when you lose more fluids from the body than you take in. Vital organs like the kidneys, brain, and heart cannot function without a proper amount of fluids and salt. Any loss of fluids from the body can cause dehydration.  CAUSES   Vomiting.  Diarrhea.  Excessive sweating.  Excessive urine output.  Fever. SYMPTOMS  Mild dehydration  Thirst.  Dry lips.  Slightly dry mouth. Moderate dehydration  Very dry mouth.  Sunken eyes.  Skin does not bounce back quickly when lightly pinched and released.  Dark urine and decreased urine production.  Decreased tear production.  Headache. Severe dehydration  Very dry mouth.  Extreme thirst.  Rapid, weak pulse (more than 100 beats per minute at rest).  Cold hands and feet.  Not able to sweat in spite of heat and temperature.  Rapid breathing.  Blue lips.  Confusion and lethargy.  Difficulty being awakened.  Minimal urine production.  No tears. DIAGNOSIS  Your caregiver will diagnose dehydration based on your symptoms and your exam. Blood and urine tests will help confirm the diagnosis. The diagnostic evaluation should also identify the cause of dehydration. TREATMENT  Treatment of mild or moderate dehydration can often be done at home by increasing the amount of fluids that you drink. It is best to drink small amounts of fluid more often. Drinking too much at one time can make vomiting worse. Refer to the home care instructions below. Severe dehydration needs to be treated at the hospital where you will probably be given intravenous (IV) fluids that contain water and electrolytes. HOME CARE INSTRUCTIONS   Ask your caregiver about specific rehydration instructions.  Drink enough fluids to keep your urine clear or pale yellow.  Drink small amounts frequently if you have nausea and vomiting.  Eat as you normally do.  Avoid:  Foods or drinks high in sugar.  Carbonated  drinks.  Juice.  Extremely hot or cold fluids.  Drinks with caffeine.  Fatty, greasy foods.  Alcohol.  Tobacco.  Overeating.  Gelatin desserts.  Wash your hands well to avoid spreading bacteria and viruses.  Only take over-the-counter or prescription medicines for pain, discomfort, or fever as directed by your caregiver.  Ask your caregiver if you should continue all prescribed and over-the-counter medicines.  Keep all follow-up appointments with your caregiver. SEEK MEDICAL CARE IF:  You have abdominal pain and it increases or stays in one area (localizes).  You have a rash, stiff neck, or severe headache.  You are irritable, sleepy, or difficult to awaken.  You are weak, dizzy, or extremely thirsty. SEEK IMMEDIATE MEDICAL CARE IF:   You are unable to keep fluids down or you get worse despite treatment.  You have frequent episodes of vomiting or diarrhea.  You have blood or green matter (bile) in your vomit.  You have blood in your stool or your stool looks black and tarry.  You have not urinated in 6 to 8 hours, or you have only urinated a small amount of very dark urine.  You have a fever.  You faint. MAKE SURE YOU:   Understand these instructions.  Will watch your condition.  Will get help right away if you are not doing well or get worse. Document Released: 09/27/2005 Document Revised: 12/20/2011 Document Reviewed: 05/17/2011 ExitCare Patient Information 2015 [employer]. This information is not intended to replace advice given to you by your health care provider. Make sure you discuss any questions you have with your health care   provider.  

## 2015-04-09 ENCOUNTER — Ambulatory Visit (HOSPITAL_BASED_OUTPATIENT_CLINIC_OR_DEPARTMENT_OTHER): Payer: Medicare Other

## 2015-04-09 VITALS — BP 112/67 | HR 76 | Temp 98.5°F

## 2015-04-09 DIAGNOSIS — R110 Nausea: Secondary | ICD-10-CM | POA: Diagnosis not present

## 2015-04-09 DIAGNOSIS — K1231 Oral mucositis (ulcerative) due to antineoplastic therapy: Secondary | ICD-10-CM

## 2015-04-09 DIAGNOSIS — C01 Malignant neoplasm of base of tongue: Secondary | ICD-10-CM

## 2015-04-09 MED ORDER — SODIUM CHLORIDE 0.9 % IV SOLN
Freq: Once | INTRAVENOUS | Status: AC
  Administered 2015-04-09: 14:00:00 via INTRAVENOUS

## 2015-04-09 MED ORDER — SODIUM CHLORIDE 0.9 % IJ SOLN
10.0000 mL | INTRAMUSCULAR | Status: DC | PRN
  Administered 2015-04-09: 10 mL
  Filled 2015-04-09: qty 10

## 2015-04-09 MED ORDER — HEPARIN SOD (PORK) LOCK FLUSH 100 UNIT/ML IV SOLN
500.0000 [IU] | Freq: Once | INTRAVENOUS | Status: AC | PRN
  Administered 2015-04-09: 500 [IU]
  Filled 2015-04-09: qty 5

## 2015-04-09 NOTE — Patient Instructions (Signed)
Dehydration, Adult Dehydration is when you lose more fluids from the body than you take in. Vital organs like the kidneys, brain, and heart cannot function without a proper amount of fluids and salt. Any loss of fluids from the body can cause dehydration.  CAUSES   Vomiting.  Diarrhea.  Excessive sweating.  Excessive urine output.  Fever. SYMPTOMS  Mild dehydration  Thirst.  Dry lips.  Slightly dry mouth. Moderate dehydration  Very dry mouth.  Sunken eyes.  Skin does not bounce back quickly when lightly pinched and released.  Dark urine and decreased urine production.  Decreased tear production.  Headache. Severe dehydration  Very dry mouth.  Extreme thirst.  Rapid, weak pulse (more than 100 beats per minute at rest).  Cold hands and feet.  Not able to sweat in spite of heat and temperature.  Rapid breathing.  Blue lips.  Confusion and lethargy.  Difficulty being awakened.  Minimal urine production.  No tears. DIAGNOSIS  Your caregiver will diagnose dehydration based on your symptoms and your exam. Blood and urine tests will help confirm the diagnosis. The diagnostic evaluation should also identify the cause of dehydration. TREATMENT  Treatment of mild or moderate dehydration can often be done at home by increasing the amount of fluids that you drink. It is best to drink small amounts of fluid more often. Drinking too much at one time can make vomiting worse. Refer to the home care instructions below. Severe dehydration needs to be treated at the hospital where you will probably be given intravenous (IV) fluids that contain water and electrolytes. HOME CARE INSTRUCTIONS   Ask your caregiver about specific rehydration instructions.  Drink enough fluids to keep your urine clear or pale yellow.  Drink small amounts frequently if you have nausea and vomiting.  Eat as you normally do.  Avoid:  Foods or drinks high in sugar.  Carbonated  drinks.  Juice.  Extremely hot or cold fluids.  Drinks with caffeine.  Fatty, greasy foods.  Alcohol.  Tobacco.  Overeating.  Gelatin desserts.  Wash your hands well to avoid spreading bacteria and viruses.  Only take over-the-counter or prescription medicines for pain, discomfort, or fever as directed by your caregiver.  Ask your caregiver if you should continue all prescribed and over-the-counter medicines.  Keep all follow-up appointments with your caregiver. SEEK MEDICAL CARE IF:  You have abdominal pain and it increases or stays in one area (localizes).  You have a rash, stiff neck, or severe headache.  You are irritable, sleepy, or difficult to awaken.  You are weak, dizzy, or extremely thirsty. SEEK IMMEDIATE MEDICAL CARE IF:   You are unable to keep fluids down or you get worse despite treatment.  You have frequent episodes of vomiting or diarrhea.  You have blood or green matter (bile) in your vomit.  You have blood in your stool or your stool looks black and tarry.  You have not urinated in 6 to 8 hours, or you have only urinated a small amount of very dark urine.  You have a fever.  You faint. MAKE SURE YOU:   Understand these instructions.  Will watch your condition.  Will get help right away if you are not doing well or get worse. Document Released: 09/27/2005 Document Revised: 12/20/2011 Document Reviewed: 05/17/2011 ExitCare Patient Information 2015 [employer]. This information is not intended to replace advice given to you by your health care provider. Make sure you discuss any questions you have with your health care   provider.  

## 2015-04-11 ENCOUNTER — Telehealth: Payer: Self-pay | Admitting: Hematology and Oncology

## 2015-04-11 ENCOUNTER — Encounter: Payer: Self-pay | Admitting: *Deleted

## 2015-04-11 ENCOUNTER — Ambulatory Visit (HOSPITAL_BASED_OUTPATIENT_CLINIC_OR_DEPARTMENT_OTHER): Payer: Medicare Other | Admitting: Hematology and Oncology

## 2015-04-11 ENCOUNTER — Ambulatory Visit: Payer: Medicare Other | Attending: Radiation Oncology

## 2015-04-11 ENCOUNTER — Ambulatory Visit (HOSPITAL_BASED_OUTPATIENT_CLINIC_OR_DEPARTMENT_OTHER): Payer: Medicare Other

## 2015-04-11 VITALS — BP 112/61 | HR 72 | Temp 98.6°F | Resp 18

## 2015-04-11 VITALS — BP 123/63 | HR 77 | Temp 98.6°F | Resp 18 | Ht 69.0 in | Wt 160.5 lb

## 2015-04-11 DIAGNOSIS — E860 Dehydration: Secondary | ICD-10-CM | POA: Diagnosis not present

## 2015-04-11 DIAGNOSIS — R110 Nausea: Secondary | ICD-10-CM

## 2015-04-11 DIAGNOSIS — K1231 Oral mucositis (ulcerative) due to antineoplastic therapy: Secondary | ICD-10-CM

## 2015-04-11 DIAGNOSIS — R1310 Dysphagia, unspecified: Secondary | ICD-10-CM | POA: Insufficient documentation

## 2015-04-11 DIAGNOSIS — R112 Nausea with vomiting, unspecified: Secondary | ICD-10-CM | POA: Diagnosis not present

## 2015-04-11 DIAGNOSIS — C01 Malignant neoplasm of base of tongue: Secondary | ICD-10-CM | POA: Diagnosis not present

## 2015-04-11 DIAGNOSIS — T451X5A Adverse effect of antineoplastic and immunosuppressive drugs, initial encounter: Secondary | ICD-10-CM

## 2015-04-11 DIAGNOSIS — E46 Unspecified protein-calorie malnutrition: Secondary | ICD-10-CM

## 2015-04-11 MED ORDER — SODIUM CHLORIDE 0.9 % IV SOLN
Freq: Once | INTRAVENOUS | Status: AC
  Administered 2015-04-11: 14:00:00 via INTRAVENOUS

## 2015-04-11 MED ORDER — ONDANSETRON HCL 8 MG PO TABS: 8.0000 mg | ORAL_TABLET | Freq: Three times a day (TID) | ORAL | Status: DC | PRN

## 2015-04-11 MED ORDER — LORAZEPAM 0.5 MG PO TABS: ORAL_TABLET | ORAL | Status: DC

## 2015-04-11 MED ORDER — HEPARIN SOD (PORK) LOCK FLUSH 100 UNIT/ML IV SOLN
500.0000 [IU] | Freq: Once | INTRAVENOUS | Status: AC | PRN
  Administered 2015-04-11: 500 [IU]
  Filled 2015-04-11: qty 5

## 2015-04-11 MED ORDER — SODIUM CHLORIDE 0.9 % IJ SOLN
10.0000 mL | INTRAMUSCULAR | Status: DC | PRN
  Administered 2015-04-11: 10 mL
  Filled 2015-04-11: qty 10

## 2015-04-11 NOTE — Progress Notes (Signed)
Oncology Nurse Navigator Documentation  Oncology Nurse Navigator Flowsheets 04/11/2015  Navigator Encounter Type Clinic/MDC  Patient Visit Type Medonc  To provide support and encouragement, care continuity and to assess for needs, met with patient during f/u appt with Dr. Alvy Bimler.  He reported:  Minimal swallowing discomfort, taking Roxanol BID.  Improved oral intake of soft foods, water.  He is keeping food diary.  Maintaining weight.   Continues to use PEG, instilling 5-5 cans/daily Osmolite 1.5.    He was encouraged by this navigator and Dr. Alvy Bimler to continue increasing oral intake with a goal of 100% within the next month.  He understands if goal met and weight stable, PEG can be removed. Agreed with Dr. Alvy Bimler to reduce IVF after next week to weekly on Friday.   He did not express any needs or concerns at this time, I encouraged him to contact me if that changes, he verbalized understanding.   Time Spent with Patient [place], RN, BSN, [place] [facility] [place] [phone]

## 2015-04-11 NOTE — Assessment & Plan Note (Signed)
His dehydration has improved with IV fluid. I encouraged oral intake as tolerated

## 2015-04-11 NOTE — Progress Notes (Signed)
[place] OFFICE PROGRESS NOTE  Patient Care Team: Laurey Morale, MD as PCP - General (Family Medicine) Heath Lark, MD as Consulting Physician (Hematology and Oncology)  SUMMARY OF ONCOLOGIC HISTORY: Oncology History   Cancer of base of tongue   Staging form: Lip and Oral Cavity, AJCC 7th Edition     Clinical stage from 12/03/2014: Stage IVA (T2, N2a, M0) - Signed by Heath Lark, MD on 12/13/2014 HPV negative           Cancer of base of tongue   11/20/2014 Pathology Results Accession: [id number] confirm squamous cell carcinoma.   11/20/2014 Procedure The patient underwent fine-needle aspirate of the right lymph node   12/02/2014 Imaging CT scan showed 2.2 cm the right tongue base mass with necrotic 4 cm right level II nodal metastasis with multiple bilateral small lymphadenopathy   12/10/2014 Imaging PET CT scan showed tongue base mass with right level II lymph node metastasis   12/24/2014 Procedure He has placement of port and feeding tube   01/01/2015 - 02/12/2015 Chemotherapy He received high dose cisplatin   01/01/2015 - 02/18/2015 Radiation Therapy Rec'd concurrent RT to base of tongue and bilateral neck:  70 Gy in 35 fractions to gross disease, 63 Gy in 35 fractions to high risk nodal echelons, 56 Gy in 35 fractions to intermediate risk nodal echelons.   01/21/2015 Adverse Reaction  cycle 2 chemotherapy dose will be reduced by 25% due to intolerable side effects   02/11/2015 Adverse Reaction Cycle 3 of chemotherapy dose will be reduced to 50% due to intolerable side effects    INTERVAL HISTORY: Please see below for problem oriented charting. He returns for further follow-up. He is getting better. He has discontinued all pain medicine. He denies further nausea or vomiting. He is able to tolerate some oral soft diet. Denies choking sensation.  REVIEW OF SYSTEMS:   Constitutional: Denies fevers, chills or abnormal weight loss Eyes: Denies blurriness of vision Ears, nose,  mouth, throat, and face: Denies mucositis or sore throat Respiratory: Denies cough, dyspnea or wheezes Cardiovascular: Denies palpitation, chest discomfort or lower extremity swelling Gastrointestinal:  Denies nausea, heartburn or change in bowel habits Skin: Denies abnormal skin rashes Lymphatics: Denies new lymphadenopathy or easy bruising Neurological:Denies numbness, tingling or new weaknesses Behavioral/Psych: Mood is stable, no new changes  All other systems were reviewed with the patient and are negative.  I have reviewed the past medical history, past surgical history, social history and family history with the patient and they are unchanged from previous note.  ALLERGIES:  has No Known Allergies.  MEDICATIONS:  Current Outpatient Prescriptions  Medication Sig Dispense Refill  . ASPIRIN PO Take 81 mg by mouth every morning.     . brimonidine (ALPHAGAN) 0.15 % ophthalmic solution   2  . carvedilol (COREG) 25 MG tablet Take 1 tablet (25 mg total) by mouth 2 (two) times daily with a meal. 180 tablet 3  . emollient (BIAFINE) cream Apply topically as needed.    . fentaNYL (DURAGESIC - DOSED MCG/HR) 25 MCG/HR patch Place 1 patch (25 mcg total) onto the skin every 3 (three) days. 5 patch 0  . lactulose (CHRONULAC) 10 GM/15ML solution Place 15 mLs (10 g total) into feeding tube 3 (three) times daily. 240 mL 6  . lidocaine (XYLOCAINE) 2 % solution Patient: Mix 1part 2% viscous lidocaine, 1part H20. Swish and/or swallow 74mL of this mixture, 40min before meals and at bedtime, up to QID 100 mL 5  .  LORazepam (ATIVAN) 0.5 MG tablet TAKE 1 TABLET BY MOUTH UP TO 3 TIMES A DAY AS NEEDED FOR ANXIETY, NAUSEA, OR CLAUSTROPHOBIA. TAKE 20 MINUTES BEFORE WEARING MASK. 60 tablet 0  . mirtazapine (REMERON) 15 MG tablet Take 1 tablet (15 mg total) by mouth at bedtime. 30 tablet 3  . morphine (ROXANOL) 20 MG/ML concentrated solution Take 1 mL (20 mg total) by mouth every 2 (two) hours as needed for severe  pain. 240 mL 0  . multivitamin (THERAGRAN) per tablet Take 1 tablet by mouth every morning.     . Nutritional Supplements (FEEDING SUPPLEMENT, OSMOLITE 1.5 CAL,) LIQD Give 1.5 cans of Osmolite 1.5 via PEG QID with 60 cc free water before and after bolus feedings. Drink or flush tube with an additional 240 cc water TID between feedings. 1422 mL 0  . ondansetron (ZOFRAN) 8 MG tablet Take 1 tablet (8 mg total) by mouth every 8 (eight) hours as needed. 60 tablet 1  . simvastatin (ZOCOR) 40 MG tablet Take 1 tablet (40 mg total) by mouth at bedtime. 90 tablet 3  . sodium fluoride (FLUORISHIELD) 1.1 % GEL dental gel Instill one drop of gel per tooth space of fluoride tray. Place over teeth for 5 minutes. Remove. Spit out excess. Repeat nightly. 120 mL prn  . sucralfate (CARAFATE) 1 G tablet Dissolve 1 tablet in 10 mL H20 and swallow 30 min prior to meals and bedtime. 60 tablet 5  . tadalafil (CIALIS) 20 MG tablet Take 1 tablet (20 mg total) by mouth daily as needed for erectile dysfunction. 30 tablet 3  . tamsulosin (FLOMAX) 0.4 MG CAPS capsule Take 1 capsule (0.4 mg total) by mouth daily. 30 capsule 5  . TRAVATAN Z 0.004 % SOLN ophthalmic solution Place 1 drop into both eyes at bedtime.      No current facility-administered medications for this visit.   Facility-Administered Medications Ordered in Other Visits  Medication Dose Route Frequency Provider Last Rate Last Dose  . sodium chloride 0.9 % injection 10 mL  10 mL Intracatheter PRN Heath Lark, MD   10 mL at 04/07/15 1627    PHYSICAL EXAMINATION: ECOG PERFORMANCE STATUS: 1 - Symptomatic but completely ambulatory  Filed Vitals:   04/11/15 1226  BP: 123/63  Pulse: 77  Temp: 98.6 F (37 C)  Resp: 18   Filed Weights   04/11/15 1226  Weight: 160 lb 8 oz (72.802 kg)    GENERAL:alert, no distress and comfortable SKIN: skin color, texture, turgor are normal, no rashes or significant lesions EYES: normal, Conjunctiva are pink and  non-injected, sclera clear OROPHARYNX:no exudate, no erythema and lips, buccal mucosa, and tongue normal . Dry mucous membrane is noted NECK: supple, thyroid normal size, non-tender, without nodularity LYMPH:  no palpable lymphadenopathy in the cervical, axillary or inguinal LUNGS: clear to auscultation and percussion with normal breathing effort HEART: regular rate & rhythm and no murmurs and no lower extremity edema ABDOMEN:abdomen soft, non-tender and normal bowel sounds. Feeding tube site looks okay Musculoskeletal:no cyanosis of digits and no clubbing  NEURO: alert & oriented x 3 with fluent speech, no focal motor/sensory deficits  LABORATORY DATA:  I have reviewed the data as listed    Component Value Date/Time   NA 136 03/12/2015 1030   NA 141 12/24/2014 0755   K 4.1 03/12/2015 1030   K 3.8 12/24/2014 0755   CL 109 12/24/2014 0755   CO2 28 03/12/2015 1030   CO2 27 12/24/2014 0755   GLUCOSE 149*  03/12/2015 1030   GLUCOSE 118* 12/24/2014 0755   BUN 16.1 03/12/2015 1030   BUN 15 12/24/2014 0755   CREATININE 0.9 03/12/2015 1030   CREATININE 0.99 12/24/2014 0755   CALCIUM 9.2 03/12/2015 1030   CALCIUM 8.9 12/24/2014 0755   PROT 6.2* 03/12/2015 1030   PROT 6.9 12/24/2014 0755   ALBUMIN 3.2* 03/12/2015 1030   ALBUMIN 4.0 12/24/2014 0755   AST 18 03/12/2015 1030   AST 21 12/24/2014 0755   ALT 19 03/12/2015 1030   ALT 25 12/24/2014 0755   ALKPHOS 84 03/12/2015 1030   ALKPHOS 63 12/24/2014 0755   BILITOT 0.31 03/12/2015 1030   BILITOT 0.7 12/24/2014 0755   GFRNONAA 80* 12/24/2014 0755   GFRAA >90 12/24/2014 0755    No results found for: SPEP, UPEP  Lab Results  Component Value Date   WBC 5.5 03/12/2015   NEUTROABS 4.2 03/12/2015   HGB 13.2 03/12/2015   HCT 40.0 03/12/2015   MCV 88.2 03/12/2015   PLT 205 03/12/2015      Chemistry      Component Value Date/Time   NA 136 03/12/2015 1030   NA 141 12/24/2014 0755   K 4.1 03/12/2015 1030   K 3.8 12/24/2014 0755    CL 109 12/24/2014 0755   CO2 28 03/12/2015 1030   CO2 27 12/24/2014 0755   BUN 16.1 03/12/2015 1030   BUN 15 12/24/2014 0755   CREATININE 0.9 03/12/2015 1030   CREATININE 0.99 12/24/2014 0755      Component Value Date/Time   CALCIUM 9.2 03/12/2015 1030   CALCIUM 8.9 12/24/2014 0755   ALKPHOS 84 03/12/2015 1030   ALKPHOS 63 12/24/2014 0755   AST 18 03/12/2015 1030   AST 21 12/24/2014 0755   ALT 19 03/12/2015 1030   ALT 25 12/24/2014 0755   BILITOT 0.31 03/12/2015 1030   BILITOT 0.7 12/24/2014 0755      ASSESSMENT & PLAN:  Cancer of base of tongue He tolerated cycle one poorly due to nausea. He had significant modification to cycle 2 and cycle 3 of therapy. I recommend we continue IV fluid support for a few more times. Since he is feeling better, I will space out his appointment to once a month. I continue to encourage him to increase his oral intake as tolerated. He has successfully weaned himself off pain medicine.   Dehydration His dehydration has improved with IV fluid. I encouraged oral intake as tolerated    Chemotherapy induced nausea and vomiting I recommend anti-medics as needed and I refill his prescription of lorazepam and ondansetron.  Protein calorie malnutrition He is gaining weight and is able to tolerate all the nutritional supplement. I will consult dietitian to follow. I encouraged him to increase oral intake as tolerated.    No orders of the defined types were placed in this encounter.   All questions were answered. The patient knows to call the clinic with any problems, questions or concerns. No barriers to learning was detected. I spent 15 minutes counseling the patient face to face. The total time spent in the appointment was 20 minutes and more than 50% was on counseling and review of test results     [facility], [place], MD 04/11/2015 1:26 PM

## 2015-04-11 NOTE — Therapy (Signed)
[facility] [address] [place], [place], [address] Phone: [phone]   Fax:  [phone]  Speech Language Pathology Treatment  Patient Details  Name: Nicholas Wilkerson MRN: [id number] Date of Birth: 1941/05/11 Referring Provider:  Laurey Morale, MD  Encounter Date: 04/11/2015      End of Session - 04/11/15 1338    Visit Number 4   Number of Visits 5   Date for SLP Re-Evaluation 05/12/15   SLP Start Time 20   SLP Stop Time  1101   SLP Time Calculation (min) 43 min   Activity Tolerance Patient tolerated treatment well      Past Medical History  Diagnosis Date  . Hypertension   . Dilated cardiomyopathy   . Hypercholesterolemia   . DIVERTICULOSIS, COLON 12/08/2007  . ARTHRITIS 10/22/2008  . History of echocardiogram 05/22/2007    EF was 45-50% / Mild concentric LV hypertrophy with mild global hypokinesis and overall mild systolic dysfunction .  Mild AV sclerosis / Mild Mitral insufficiency / compared to prior study 04/24/02 -- LV function has improved further.    . CHF (congestive heart failure)     sees Dr. Peter [name]   . BPH (benign prostatic hyperplasia)   . Squamous cell carcinoma 11/20/14    base of tongue primary  . Skin cancer     squamous cell, basal cell  . H/O asbestos exposure   . Glaucoma   . Radiation 01/01/15-02/18/15    base of tongue and bilateral neck 70 Gy    Past Surgical History  Procedure Laterality Date  . Cardiac catheterization  10/17/2001    EF estimated at 30% / moderate LV enlargement  / 1. Minimal nonobstructive atherosclerotic coronary artery disease / 2. Severe LV dysfunction with global hypokinesia consistent with dilated nonischemic cardiomyopath / 3. Moderate pulmonary hypertension  . Colonoscopy  08-15-07    per Dr. Carlean Purl, clear , repeat in 10 yrs  . Lymph node biopsy      There were no vitals filed for this visit.  Visit Diagnosis: Dysphagia      Subjective  Assessment - 04/11/15 1337    Subjective "You're gonna be pleased." (re: food journal) Pt's last rad was 02-18-15.               ADULT SLP TREATMENT - 04/11/15 1031    General Information   Behavior/Cognition Alert;Cooperative;Pleasant mood   Treatment Provided   Treatment provided Dysphagia   Dysphagia Treatment   Temperature Spikes Noted No   Treatment Methods Skilled observation;Therapeutic exercise   Patient observed directly with PO's Yes   Type of PO's observed Dysphagia 1 (puree);Thin liquids   Liquids provided via Cup   Pharyngeal Phase Signs & Symptoms Immediate throat clear  on 1/6 swallows   Other treatment/comments Pt with POs of applesauce with "a bit of a burn" in pharynx, American cheese with water wash, and cereal bar with water wash were appearedly WNL, without overt s/s aspiration that concerned SLP (minimal immediate throat clearing on 2/10 swallows). Occasional min A needed with HEP as pt performed Mendelsohn, vocal adduction, and chin pushback incorrectly.   Pain Assessment   Pain Assessment 0-10   Pain Score 2    Pain Location throat   Pain Descriptors / Indicators Burning   Pain Intervention(s) Monitored during session   Assessment / Recommendations / Plan   Plan Continue with current plan of care   Dysphagia Recommendations   Diet recommendations --  as  tolerated, thin liquids   Liquids provided via Cup  or bottle   Compensations Slow rate;Small sips/bites;Multiple dry swallows after each bite/sip;Follow solids with liquid;Effortful swallow   Progression Toward Goals   Progression toward goals Progressing toward goals          SLP Education - 04/11/15 1337    Education provided Yes   Education Details HEP   Person(s) Educated Patient   Methods Explanation;Demonstration;Verbal cues   Comprehension Verbalized understanding;Returned demonstration          SLP Short Term Goals - 04/11/15 1340    SLP SHORT TERM GOAL #1   Title pt will  complete HEP with rare min A   Time 1   Period --  visit   Status On-going   SLP SHORT TERM GOAL #2   Title pt will tell SLP why he is completing HEP   Time 1   Period --  visit   Status Achieved          SLP Long Term Goals - 04/11/15 1340    SLP LONG TERM GOAL #1   Title pt will complete HEP with modified independence over two sessions   Time 2   Period --  visits   Status On-going  goal not met 03-13-15   SLP LONG TERM GOAL #2   Title pt will tell SLP three signs/symptoms aspiratiion PNA with modified independence   Time 2   Period --  visits   Status On-going  goal not met 03-13-15   SLP LONG TERM GOAL #3   Title pt to tell SLP how a food journal can assist in return to [facility] - 04/11/15 1338    Clinical Impression Statement Cont'd skilled ST remains necessary to assess swallowing safety with POs and to assess performance with HEP.   Speech Therapy Frequency --  approx every 2-4 weeks   Duration --  for 60 days (05-12-15)   Treatment/Interventions Pharyngeal strengthening exercises;Oral motor exercises;Compensatory strategies;Patient/family education;SLP instruction and feedback   Potential to Achieve Goals Good        Problem List Patient Active Problem List   Diagnosis Date Noted  . Protein calorie malnutrition 03/24/2015  . Depression, acute 03/24/2015  . Dehydration 01/24/2015  . Palpitations 01/24/2015  . Leukopenia due to antineoplastic chemotherapy 01/21/2015  . Mucositis due to chemotherapy 01/14/2015  . Chemotherapy induced nausea and vomiting 01/07/2015  . Weight loss 01/07/2015  . History of skin cancer 12/03/2014  . Cancer of base of tongue 12/02/2014  . Chronic systolic CHF (congestive heart failure) 07/12/2013  . MUSCLE STRAIN, ABDOMINAL WALL 11/28/2008  . ARTHRITIS 10/22/2008  . BURSITIS, LEFT SHOULDER 07/24/2008  . INTERNAL HEMORRHOIDS 12/08/2007  . EXTERNAL HEMORRHOIDS 12/08/2007  . DIVERTICULOSIS,  COLON 12/08/2007  . RECTAL BLEEDING 12/08/2007  . CONGESTIVE HEART FAILURE, HX OF 12/08/2007  . Congestive dilated cardiomyopathy 06/13/2007  . HX, PERSONAL, ARTHRITIS 06/13/2007  . Hypercholesterolemia 05/15/2007  . Essential hypertension 05/15/2007    [facility] , [place], [place]  04/11/2015, 1:41 PM  [facility] [address] [place], [place], [address] Phone: [phone]   Fax:  [phone]

## 2015-04-11 NOTE — Patient Instructions (Signed)
Dehydration, Adult Dehydration is when you lose more fluids from the body than you take in. Vital organs like the kidneys, brain, and heart cannot function without a proper amount of fluids and salt. Any loss of fluids from the body can cause dehydration.  CAUSES   Vomiting.  Diarrhea.  Excessive sweating.  Excessive urine output.  Fever. SYMPTOMS  Mild dehydration  Thirst.  Dry lips.  Slightly dry mouth. Moderate dehydration  Very dry mouth.  Sunken eyes.  Skin does not bounce back quickly when lightly pinched and released.  Dark urine and decreased urine production.  Decreased tear production.  Headache. Severe dehydration  Very dry mouth.  Extreme thirst.  Rapid, weak pulse (more than 100 beats per minute at rest).  Cold hands and feet.  Not able to sweat in spite of heat and temperature.  Rapid breathing.  Blue lips.  Confusion and lethargy.  Difficulty being awakened.  Minimal urine production.  No tears. DIAGNOSIS  Your caregiver will diagnose dehydration based on your symptoms and your exam. Blood and urine tests will help confirm the diagnosis. The diagnostic evaluation should also identify the cause of dehydration. TREATMENT  Treatment of mild or moderate dehydration can often be done at home by increasing the amount of fluids that you drink. It is best to drink small amounts of fluid more often. Drinking too much at one time can make vomiting worse. Refer to the home care instructions below. Severe dehydration needs to be treated at the hospital where you will probably be given intravenous (IV) fluids that contain water and electrolytes. HOME CARE INSTRUCTIONS   Ask your caregiver about specific rehydration instructions.  Drink enough fluids to keep your urine clear or pale yellow.  Drink small amounts frequently if you have nausea and vomiting.  Eat as you normally do.  Avoid:  Foods or drinks high in sugar.  Carbonated  drinks.  Juice.  Extremely hot or cold fluids.  Drinks with caffeine.  Fatty, greasy foods.  Alcohol.  Tobacco.  Overeating.  Gelatin desserts.  Wash your hands well to avoid spreading bacteria and viruses.  Only take over-the-counter or prescription medicines for pain, discomfort, or fever as directed by your caregiver.  Ask your caregiver if you should continue all prescribed and over-the-counter medicines.  Keep all follow-up appointments with your caregiver. SEEK MEDICAL CARE IF:  You have abdominal pain and it increases or stays in one area (localizes).  You have a rash, stiff neck, or severe headache.  You are irritable, sleepy, or difficult to awaken.  You are weak, dizzy, or extremely thirsty. SEEK IMMEDIATE MEDICAL CARE IF:   You are unable to keep fluids down or you get worse despite treatment.  You have frequent episodes of vomiting or diarrhea.  You have blood or green matter (bile) in your vomit.  You have blood in your stool or your stool looks black and tarry.  You have not urinated in 6 to 8 hours, or you have only urinated a small amount of very dark urine.  You have a fever.  You faint. MAKE SURE YOU:   Understand these instructions.  Will watch your condition.  Will get help right away if you are not doing well or get worse. Document Released: 09/27/2005 Document Revised: 12/20/2011 Document Reviewed: 05/17/2011 ExitCare Patient Information 2015 [employer]. This information is not intended to replace advice given to you by your health care provider. Make sure you discuss any questions you have with your health care   provider.  

## 2015-04-11 NOTE — Assessment & Plan Note (Signed)
He tolerated cycle one poorly due to nausea. He had significant modification to cycle 2 and cycle 3 of therapy. I recommend we continue IV fluid support for a few more times. Since he is feeling better, I will space out his appointment to once a month. I continue to encourage him to increase his oral intake as tolerated. He has successfully weaned himself off pain medicine.

## 2015-04-11 NOTE — Assessment & Plan Note (Signed)
I recommend anti-medics as needed and I refill his prescription of lorazepam and ondansetron.

## 2015-04-11 NOTE — Patient Instructions (Signed)
You must complete the exercises as prescribed in order to allow your self best opportunity for normal swallowing to be regained. Continue to keep your food journal.

## 2015-04-11 NOTE — Telephone Encounter (Signed)
Gave and printed appt sched and avs for pt for July and Aug °

## 2015-04-11 NOTE — Assessment & Plan Note (Signed)
He is gaining weight and is able to tolerate all the nutritional supplement. I will consult dietitian to follow. I encouraged him to increase oral intake as tolerated.

## 2015-04-14 ENCOUNTER — Encounter (HOSPITAL_COMMUNITY): Payer: Self-pay | Admitting: Emergency Medicine

## 2015-04-14 ENCOUNTER — Other Ambulatory Visit: Payer: Self-pay

## 2015-04-14 ENCOUNTER — Emergency Department (HOSPITAL_COMMUNITY)
Admission: EM | Admit: 2015-04-14 | Discharge: 2015-04-14 | Disposition: A | Discharge status: Home / Self Care | Payer: Medicare Other | Attending: Emergency Medicine | Admitting: Emergency Medicine

## 2015-04-14 DIAGNOSIS — I509 Heart failure, unspecified: Secondary | ICD-10-CM | POA: Diagnosis not present

## 2015-04-14 DIAGNOSIS — R1110 Vomiting, unspecified: Secondary | ICD-10-CM | POA: Diagnosis not present

## 2015-04-14 DIAGNOSIS — Z9889 Other specified postprocedural states: Secondary | ICD-10-CM | POA: Diagnosis not present

## 2015-04-14 DIAGNOSIS — N400 Enlarged prostate without lower urinary tract symptoms: Secondary | ICD-10-CM | POA: Diagnosis not present

## 2015-04-14 DIAGNOSIS — I10 Essential (primary) hypertension: Secondary | ICD-10-CM | POA: Insufficient documentation

## 2015-04-14 DIAGNOSIS — R1030 Lower abdominal pain, unspecified: Secondary | ICD-10-CM | POA: Insufficient documentation

## 2015-04-14 DIAGNOSIS — H409 Unspecified glaucoma: Secondary | ICD-10-CM | POA: Insufficient documentation

## 2015-04-14 DIAGNOSIS — Z85828 Personal history of other malignant neoplasm of skin: Secondary | ICD-10-CM | POA: Insufficient documentation

## 2015-04-14 DIAGNOSIS — E780 Pure hypercholesterolemia: Secondary | ICD-10-CM | POA: Diagnosis not present

## 2015-04-14 DIAGNOSIS — M1990 Unspecified osteoarthritis, unspecified site: Secondary | ICD-10-CM | POA: Diagnosis not present

## 2015-04-14 DIAGNOSIS — Z79899 Other long term (current) drug therapy: Secondary | ICD-10-CM | POA: Insufficient documentation

## 2015-04-14 DIAGNOSIS — R61 Generalized hyperhidrosis: Secondary | ICD-10-CM | POA: Diagnosis not present

## 2015-04-14 DIAGNOSIS — R197 Diarrhea, unspecified: Secondary | ICD-10-CM | POA: Insufficient documentation

## 2015-04-14 LAB — URINALYSIS, ROUTINE W REFLEX MICROSCOPIC
Bilirubin Urine: NEGATIVE
Glucose, UA: NEGATIVE mg/dL
HGB URINE DIPSTICK: NEGATIVE
Ketones, ur: 15 mg/dL — AB
Nitrite: NEGATIVE
Protein, ur: 100 mg/dL — AB
Specific Gravity, Urine: 1.035 — ABNORMAL HIGH (ref 1.005–1.030)
Urobilinogen, UA: 1 mg/dL (ref 0.0–1.0)
pH: 5.5 (ref 5.0–8.0)

## 2015-04-14 LAB — CBC WITH DIFFERENTIAL/PLATELET
BASOS ABS: 0 10*3/uL (ref 0.0–0.1)
Basophils Relative: 0 % (ref 0–1)
EOS ABS: 0 10*3/uL (ref 0.0–0.7)
Eosinophils Relative: 0 % (ref 0–5)
HCT: 47.2 % (ref 39.0–52.0)
HEMOGLOBIN: 16.2 g/dL (ref 13.0–17.0)
LYMPHS PCT: 12 % (ref 12–46)
Lymphs Abs: 1 10*3/uL (ref 0.7–4.0)
MCH: 31.5 pg (ref 26.0–34.0)
MCHC: 34.3 g/dL (ref 30.0–36.0)
MCV: 91.8 fL (ref 78.0–100.0)
MONOS PCT: 5 % (ref 3–12)
Monocytes Absolute: 0.4 10*3/uL (ref 0.1–1.0)
NEUTROS ABS: 7.4 10*3/uL (ref 1.7–7.7)
Neutrophils Relative %: 83 % — ABNORMAL HIGH (ref 43–77)
Platelets: 257 10*3/uL (ref 150–400)
RBC: 5.14 MIL/uL (ref 4.22–5.81)
RDW: 15.5 % (ref 11.5–15.5)
WBC: 8.8 10*3/uL (ref 4.0–10.5)

## 2015-04-14 LAB — COMPREHENSIVE METABOLIC PANEL
ALBUMIN: 4.9 g/dL (ref 3.5–5.0)
ALT: 19 U/L (ref 17–63)
AST: 24 U/L (ref 15–41)
Alkaline Phosphatase: 92 U/L (ref 38–126)
Anion gap: 13 (ref 5–15)
BUN: 23 mg/dL — AB (ref 6–20)
CHLORIDE: 100 mmol/L — AB (ref 101–111)
CO2: 26 mmol/L (ref 22–32)
Calcium: 10.8 mg/dL — ABNORMAL HIGH (ref 8.9–10.3)
Creatinine, Ser: 1.49 mg/dL — ABNORMAL HIGH (ref 0.61–1.24)
GFR calc Af Amer: 53 mL/min — ABNORMAL LOW (ref >60)
GFR, EST NON AFRICAN AMERICAN: 45 mL/min — AB (ref >60)
Glucose, Bld: 172 mg/dL — ABNORMAL HIGH (ref 65–99)
Potassium: 3.8 mmol/L (ref 3.5–5.1)
SODIUM: 139 mmol/L (ref 135–145)
TOTAL PROTEIN: 8.9 g/dL — AB (ref 6.5–8.1)
Total Bilirubin: 0.8 mg/dL (ref 0.3–1.2)

## 2015-04-14 LAB — URINE MICROSCOPIC-ADD ON

## 2015-04-14 MED ORDER — ONDANSETRON HCL 4 MG/2ML IJ SOLN
4.0000 mg | Freq: Once | INTRAMUSCULAR | Status: AC
  Administered 2015-04-14: 4 mg via INTRAVENOUS
  Filled 2015-04-14: qty 2

## 2015-04-14 MED ORDER — SODIUM CHLORIDE 0.9 % IV BOLUS (SEPSIS)
1000.0000 mL | Freq: Once | INTRAVENOUS | Status: AC
  Administered 2015-04-14: 1000 mL via INTRAVENOUS

## 2015-04-14 MED ORDER — SODIUM CHLORIDE 0.9 % IV SOLN
Freq: Once | INTRAVENOUS | Status: AC
  Administered 2015-04-14: 12:00:00 via INTRAVENOUS

## 2015-04-14 NOTE — ED Notes (Signed)
Per pt, states he is cancer patient-diarrhea, watery stools since yesterday-cant keep anything down

## 2015-04-14 NOTE — ED Notes (Signed)
MD at bedside. 

## 2015-04-14 NOTE — Discharge Instructions (Signed)

## 2015-04-14 NOTE — ED Provider Notes (Signed)
CSN: [id number]     Arrival date & time 04/14/15  3382 History   First MD Initiated Contact with Patient 04/14/15 1004     Chief Complaint  Patient presents with  . Diarrhea     (Consider location/radiation/quality/duration/timing/severity/associated sxs/prior Treatment) Patient is a 74 y.o. male presenting with diarrhea.  Diarrhea Quality:  Watery Severity:  Severe Onset quality:  Gradual Duration:  18 hours Timing:  Constant Progression:  Improving Relieved by:  Nothing Worsened by:  Nothing tried Associated symptoms: abdominal pain (mild suprapubic), diaphoresis and vomiting (a few times)   Associated symptoms: no fever   Associated symptoms comment:  Dizziness on standing.   Past Medical History  Diagnosis Date  . Hypertension   . Dilated cardiomyopathy   . Hypercholesterolemia   . DIVERTICULOSIS, COLON 12/08/2007  . ARTHRITIS 10/22/2008  . History of echocardiogram 05/22/2007    EF was 45-50% / Mild concentric LV hypertrophy with mild global hypokinesis and overall mild systolic dysfunction .  Mild AV sclerosis / Mild Mitral insufficiency / compared to prior study 04/24/02 -- LV function has improved further.    . CHF (congestive heart failure)     sees Dr. Peter [name]   . BPH (benign prostatic hyperplasia)   . Squamous cell carcinoma 11/20/14    base of tongue primary  . Skin cancer     squamous cell, basal cell  . H/O asbestos exposure   . Glaucoma   . Radiation 01/01/15-02/18/15    base of tongue and bilateral neck 70 Gy   Past Surgical History  Procedure Laterality Date  . Cardiac catheterization  10/17/2001    EF estimated at 30% / moderate LV enlargement  / 1. Minimal nonobstructive atherosclerotic coronary artery disease / 2. Severe LV dysfunction with global hypokinesia consistent with dilated nonischemic cardiomyopath / 3. Moderate pulmonary hypertension  . Colonoscopy  08-15-07    per Dr. Carlean Purl, clear , repeat in 10 yrs  . Lymph node biopsy     Family  History  Problem Relation Age of Onset  . Stroke Father   . Cancer Father     kidney ca  . Angina Mother    History  Substance Use Topics  . Smoking status: Never Smoker   . Smokeless tobacco: Never Used  . Alcohol Use: 0.0 oz/week    0 Standard drinks or equivalent per week     Comment: occl    Review of Systems  Constitutional: Positive for diaphoresis. Negative for fever.  Gastrointestinal: Positive for vomiting (a few times), abdominal pain (mild suprapubic) and diarrhea.  All other systems reviewed and are negative.     Allergies  Review of patient's allergies indicates no known allergies.  Home Medications   Prior to Admission medications   Medication Sig Start Date End Date Taking? Authorizing Provider  carvedilol (COREG) 25 MG tablet Take 1 tablet (25 mg total) by mouth 2 (two) times daily with a meal. 08/06/14  Yes Laurey Morale, MD  lactulose (CHRONULAC) 10 GM/15ML solution Place 15 mLs (10 g total) into feeding tube 3 (three) times daily. 02/05/15  Yes Ni Gorsuch, MD  LORazepam (ATIVAN) 0.5 MG tablet TAKE 1 TABLET BY MOUTH UP TO 3 TIMES A DAY AS NEEDED FOR ANXIETY, NAUSEA, OR CLAUSTROPHOBIA. TAKE 20 MINUTES BEFORE WEARING MASK. 04/11/15  Yes Heath Lark, MD  mirtazapine (REMERON) 15 MG tablet Take 1 tablet (15 mg total) by mouth at bedtime. 03/24/15  Yes Heath Lark, MD  morphine (ROXANOL) 20 MG/ML concentrated  solution Take 1 mL (20 mg total) by mouth every 2 (two) hours as needed for severe pain. 03/24/15  Yes Heath Lark, MD  Nutritional Supplements (FEEDING SUPPLEMENT, OSMOLITE 1.5 CAL,) LIQD Give 1.5 cans of Osmolite 1.5 via PEG QID with 60 cc free water before and after bolus feedings. Drink or flush tube with an additional 240 cc water TID between feedings. 03/24/15  Yes Heath Lark, MD  ondansetron (ZOFRAN) 8 MG tablet Take 1 tablet (8 mg total) by mouth every 8 (eight) hours as needed. 04/11/15  Yes Heath Lark, MD  sodium fluoride (FLUORISHIELD) 1.1 % GEL dental gel  Instill one drop of gel per tooth space of fluoride tray. Place over teeth for 5 minutes. Remove. Spit out excess. Repeat nightly. 12/17/14  Yes Lenn Cal, DDS  tamsulosin (FLOMAX) 0.4 MG CAPS capsule Take 1 capsule (0.4 mg total) by mouth daily. 12/26/14  Yes Laurey Morale, MD  TRAVATAN Z 0.004 % SOLN ophthalmic solution Place 1 drop into both eyes at bedtime.  11/29/13  Yes Historical Provider, MD  fentaNYL (DURAGESIC - DOSED MCG/HR) 25 MCG/HR patch Place 1 patch (25 mcg total) onto the skin every 3 (three) days. Patient not taking: Reported on 04/14/2015 03/24/15   Heath Lark, MD  lidocaine (XYLOCAINE) 2 % solution Patient: Mix 1part 2% viscous lidocaine, 1part H20. Swish and/or swallow 13mL of this mixture, 86min before meals and at bedtime, up to QID Patient not taking: Reported on 04/14/2015 01/13/15   Eppie Gibson, MD  simvastatin (ZOCOR) 40 MG tablet Take 1 tablet (40 mg total) by mouth at bedtime. Patient not taking: Reported on 04/14/2015 08/06/14   Laurey Morale, MD  sucralfate (CARAFATE) 1 G tablet Dissolve 1 tablet in 10 mL H20 and swallow 30 min prior to meals and bedtime. Patient not taking: Reported on 04/14/2015 01/20/15   Eppie Gibson, MD  tadalafil (CIALIS) 20 MG tablet Take 1 tablet (20 mg total) by mouth daily as needed for erectile dysfunction. Patient not taking: Reported on 04/14/2015 09/13/13   Laurey Morale, MD   BP 134/91 mmHg  Pulse 94  Temp(Src) 98 F (36.7 C) (Oral)  Resp 18  SpO2 100% Physical Exam  Constitutional: He is oriented to person, place, and time. He appears well-developed and well-nourished. No distress.  HENT:  Head: Normocephalic and atraumatic.  Mouth/Throat: Mucous membranes are dry. No posterior oropharyngeal edema or posterior oropharyngeal erythema.  Eyes: Conjunctivae are normal. Pupils are equal, round, and reactive to light. No scleral icterus.  Neck: Neck supple.  Cardiovascular: Normal rate, regular rhythm, normal heart sounds and intact distal  pulses.   No murmur heard. Pulmonary/Chest: Effort normal and breath sounds normal. No stridor. No respiratory distress. He has no wheezes. He has no rales.  Abdominal: Soft. He exhibits no distension. There is no tenderness. There is no rebound and no guarding.  Musculoskeletal: Normal range of motion. He exhibits no edema.  Neurological: He is alert and oriented to person, place, and time.  Skin: Skin is warm and dry. No rash noted.  Psychiatric: He has a normal mood and affect. His behavior is normal.  Nursing note and vitals reviewed.   ED Course  Procedures (including critical care time) Labs Review Labs Reviewed  CBC WITH DIFFERENTIAL/PLATELET - Abnormal; Notable for the following:    Neutrophils Relative % 83 (*)    All other components within normal limits  COMPREHENSIVE METABOLIC PANEL - Abnormal; Notable for the following:    Chloride 100 (*)  Glucose, Bld 172 (*)    BUN 23 (*)    Creatinine, Ser 1.49 (*)    Calcium 10.8 (*)    Total Protein 8.9 (*)    GFR calc non Af Amer 45 (*)    GFR calc Af Amer 53 (*)    All other components within normal limits  URINALYSIS, ROUTINE W REFLEX MICROSCOPIC (NOT AT [facility]) - Abnormal; Notable for the following:    Color, Urine ORANGE (*)    APPearance CLOUDY (*)    Specific Gravity, Urine 1.035 (*)    Ketones, ur 15 (*)    Protein, ur 100 (*)    Leukocytes, UA TRACE (*)    All other components within normal limits  URINE MICROSCOPIC-ADD ON    Imaging Review No results found.   EKG Interpretation   Date/Time:  Monday April 14 2015 10:14:27 EDT Ventricular Rate:  92 PR Interval:  150 QRS Duration: 83 QT Interval:  364 QTC Calculation: 450 R Axis:   0 Text Interpretation:  Sinus rhythm Anterior infarct, old Baseline wander  in lead(s) V6 Confirmed by [place] ([id number]) on 04/14/2015 10:17:58  AM      MDM   Final diagnoses:  Diarrhea    Nontoxic, slightly dehydrated.  No abdominal tenderness.  His wife also  recently had a bout of diarrhea.  Likely hit him harder since he's recovering from cancer treatments (last chemo 6 or 7 weeks ago).    Felt better after IV fluids.  His creatinine was slightly increased, likely from dehydration/diarrhea.  He had no further diarrhea while in ED.  Likely has self limited diarrheal illness.  Stable for dc home.    Serita Grit, MD 04/14/15 [phone]

## 2015-04-16 ENCOUNTER — Telehealth: Payer: Self-pay | Admitting: *Deleted

## 2015-04-16 ENCOUNTER — Other Ambulatory Visit: Payer: Self-pay | Admitting: Hematology and Oncology

## 2015-04-16 ENCOUNTER — Other Ambulatory Visit (HOSPITAL_BASED_OUTPATIENT_CLINIC_OR_DEPARTMENT_OTHER): Payer: Medicare Other

## 2015-04-16 ENCOUNTER — Telehealth: Payer: Self-pay | Admitting: Hematology and Oncology

## 2015-04-16 ENCOUNTER — Ambulatory Visit (HOSPITAL_BASED_OUTPATIENT_CLINIC_OR_DEPARTMENT_OTHER): Payer: Medicare Other

## 2015-04-16 VITALS — BP 120/75 | HR 80 | Temp 98.3°F | Resp 16

## 2015-04-16 DIAGNOSIS — C01 Malignant neoplasm of base of tongue: Secondary | ICD-10-CM

## 2015-04-16 DIAGNOSIS — R197 Diarrhea, unspecified: Secondary | ICD-10-CM

## 2015-04-16 DIAGNOSIS — K1231 Oral mucositis (ulcerative) due to antineoplastic therapy: Secondary | ICD-10-CM

## 2015-04-16 DIAGNOSIS — R110 Nausea: Secondary | ICD-10-CM

## 2015-04-16 DIAGNOSIS — R1111 Vomiting without nausea: Secondary | ICD-10-CM

## 2015-04-16 LAB — COMPREHENSIVE METABOLIC PANEL (CC13)
ALT: 28 U/L (ref 0–55)
AST: 31 U/L (ref 5–34)
Albumin: 3.7 g/dL (ref 3.5–5.0)
Alkaline Phosphatase: 65 U/L (ref 40–150)
Anion Gap: 10 mEq/L (ref 3–11)
BUN: 29.1 mg/dL — AB (ref 7.0–26.0)
CALCIUM: 9.6 mg/dL (ref 8.4–10.4)
CHLORIDE: 104 meq/L (ref 98–109)
CO2: 27 mEq/L (ref 22–29)
Creatinine: 1.2 mg/dL (ref 0.7–1.3)
EGFR: 61 mL/min/{1.73_m2} — AB (ref >90)
GLUCOSE: 134 mg/dL (ref 70–140)
Potassium: 4.2 mEq/L (ref 3.5–5.1)
SODIUM: 140 meq/L (ref 136–145)
Total Bilirubin: 0.46 mg/dL (ref 0.20–1.20)
Total Protein: 6.6 g/dL (ref 6.4–8.3)

## 2015-04-16 LAB — MAGNESIUM (CC13): MAGNESIUM: 2.6 mg/dL — AB (ref 1.5–2.5)

## 2015-04-16 MED ORDER — SODIUM CHLORIDE 0.9 % IV SOLN
1000.0000 mL | Freq: Once | INTRAVENOUS | Status: AC
  Administered 2015-04-16: 1000 mL via INTRAVENOUS

## 2015-04-16 MED ORDER — HEPARIN SOD (PORK) LOCK FLUSH 100 UNIT/ML IV SOLN
500.0000 [IU] | Freq: Once | INTRAVENOUS | Status: AC | PRN
  Administered 2015-04-16: 500 [IU]
  Filled 2015-04-16: qty 5

## 2015-04-16 MED ORDER — SODIUM CHLORIDE 0.9 % IJ SOLN
10.0000 mL | INTRAMUSCULAR | Status: DC | PRN
  Administered 2015-04-16: 10 mL
  Filled 2015-04-16: qty 10

## 2015-04-16 NOTE — Telephone Encounter (Signed)
-----   Message from Heath Lark, MD sent at 04/16/2015  2:57 PM EDT ----- Regarding: labs OK He might be at infusion Let him know labs are better ----- Message -----    From: [facility] Interface    Sent: 04/16/2015   1:53 PM      To: Heath Lark, MD

## 2015-04-16 NOTE — Patient Instructions (Signed)
Dehydration, Adult Dehydration is when you lose more fluids from the body than you take in. Vital organs like the kidneys, brain, and heart cannot function without a proper amount of fluids and salt. Any loss of fluids from the body can cause dehydration.  CAUSES   Vomiting.  Diarrhea.  Excessive sweating.  Excessive urine output.  Fever. SYMPTOMS  Mild dehydration  Thirst.  Dry lips.  Slightly dry mouth. Moderate dehydration  Very dry mouth.  Sunken eyes.  Skin does not bounce back quickly when lightly pinched and released.  Dark urine and decreased urine production.  Decreased tear production.  Headache. Severe dehydration  Very dry mouth.  Extreme thirst.  Rapid, weak pulse (more than 100 beats per minute at rest).  Cold hands and feet.  Not able to sweat in spite of heat and temperature.  Rapid breathing.  Blue lips.  Confusion and lethargy.  Difficulty being awakened.  Minimal urine production.  No tears. DIAGNOSIS  Your caregiver will diagnose dehydration based on your symptoms and your exam. Blood and urine tests will help confirm the diagnosis. The diagnostic evaluation should also identify the cause of dehydration. TREATMENT  Treatment of mild or moderate dehydration can often be done at home by increasing the amount of fluids that you drink. It is best to drink small amounts of fluid more often. Drinking too much at one time can make vomiting worse. Refer to the home care instructions below. Severe dehydration needs to be treated at the hospital where you will probably be given intravenous (IV) fluids that contain water and electrolytes. HOME CARE INSTRUCTIONS   Ask your caregiver about specific rehydration instructions.  Drink enough fluids to keep your urine clear or pale yellow.  Drink small amounts frequently if you have nausea and vomiting.  Eat as you normally do.  Avoid:  Foods or drinks high in sugar.  Carbonated  drinks.  Juice.  Extremely hot or cold fluids.  Drinks with caffeine.  Fatty, greasy foods.  Alcohol.  Tobacco.  Overeating.  Gelatin desserts.  Wash your hands well to avoid spreading bacteria and viruses.  Only take over-the-counter or prescription medicines for pain, discomfort, or fever as directed by your caregiver.  Ask your caregiver if you should continue all prescribed and over-the-counter medicines.  Keep all follow-up appointments with your caregiver. SEEK MEDICAL CARE IF:  You have abdominal pain and it increases or stays in one area (localizes).  You have a rash, stiff neck, or severe headache.  You are irritable, sleepy, or difficult to awaken.  You are weak, dizzy, or extremely thirsty. SEEK IMMEDIATE MEDICAL CARE IF:   You are unable to keep fluids down or you get worse despite treatment.  You have frequent episodes of vomiting or diarrhea.  You have blood or green matter (bile) in your vomit.  You have blood in your stool or your stool looks black and tarry.  You have not urinated in 6 to 8 hours, or you have only urinated a small amount of very dark urine.  You have a fever.  You faint. MAKE SURE YOU:   Understand these instructions.  Will watch your condition.  Will get help right away if you are not doing well or get worse. Document Released: 09/27/2005 Document Revised: 12/20/2011 Document Reviewed: 05/17/2011 ExitCare Patient Information 2015 [employer]. This information is not intended to replace advice given to you by your health care provider. Make sure you discuss any questions you have with your health care   provider.  

## 2015-04-16 NOTE — Telephone Encounter (Signed)
s.w. pt and advised on todays labs....pt ok and aware

## 2015-04-16 NOTE — Telephone Encounter (Signed)
Placed POF for labs today

## 2015-04-16 NOTE — Telephone Encounter (Signed)
Oncology Nurse Navigator Documentation  Oncology Nurse Navigator Flowsheets 04/16/2015  Navigator Encounter Type Telephone  Patient called to inform of ED visit on Monday r/t multiple episodes of diarrhea and some emesis Sunday.  He indicated he thinks he had a stomach virus b/c his wife also experienced similar symptoms.  He received IVF and was discharged.  He expressed concern re: elevated creatinine (1.49), called to inquire if additional lab work needed in conjunction with IVF scheduled for today or Friday.  We discussed that Dr. Alvy Bimler may schedule additional IVF than what is currently scheduled.  I indicated I would notify Dr. Alvy Bimler    Patient Visit Type -  Time Spent with Patient [place], RN, BSN, [place] at [place] [phone]

## 2015-04-16 NOTE — Telephone Encounter (Signed)
Pt notified of results below 

## 2015-04-18 ENCOUNTER — Ambulatory Visit (HOSPITAL_BASED_OUTPATIENT_CLINIC_OR_DEPARTMENT_OTHER): Payer: Medicare Other

## 2015-04-18 VITALS — BP 114/55 | HR 76 | Temp 98.3°F | Resp 18

## 2015-04-18 DIAGNOSIS — R110 Nausea: Secondary | ICD-10-CM

## 2015-04-18 DIAGNOSIS — K1231 Oral mucositis (ulcerative) due to antineoplastic therapy: Secondary | ICD-10-CM

## 2015-04-18 DIAGNOSIS — R1111 Vomiting without nausea: Secondary | ICD-10-CM | POA: Diagnosis not present

## 2015-04-18 DIAGNOSIS — R197 Diarrhea, unspecified: Secondary | ICD-10-CM

## 2015-04-18 DIAGNOSIS — C01 Malignant neoplasm of base of tongue: Secondary | ICD-10-CM

## 2015-04-18 MED ORDER — SODIUM CHLORIDE 0.9 % IV SOLN
Freq: Once | INTRAVENOUS | Status: AC
  Administered 2015-04-18: 14:00:00 via INTRAVENOUS

## 2015-04-18 MED ORDER — SODIUM CHLORIDE 0.9 % IJ SOLN
10.0000 mL | INTRAMUSCULAR | Status: DC | PRN
  Administered 2015-04-18: 10 mL
  Filled 2015-04-18: qty 10

## 2015-04-18 MED ORDER — HEPARIN SOD (PORK) LOCK FLUSH 100 UNIT/ML IV SOLN
500.0000 [IU] | Freq: Once | INTRAVENOUS | Status: AC | PRN
  Administered 2015-04-18: 500 [IU]
  Filled 2015-04-18: qty 5

## 2015-04-18 NOTE — Patient Instructions (Signed)
Dehydration, Adult Dehydration is when you lose more fluids from the body than you take in. Vital organs like the kidneys, brain, and heart cannot function without a proper amount of fluids and salt. Any loss of fluids from the body can cause dehydration.  CAUSES   Vomiting.  Diarrhea.  Excessive sweating.  Excessive urine output.  Fever. SYMPTOMS  Mild dehydration  Thirst.  Dry lips.  Slightly dry mouth. Moderate dehydration  Very dry mouth.  Sunken eyes.  Skin does not bounce back quickly when lightly pinched and released.  Dark urine and decreased urine production.  Decreased tear production.  Headache. Severe dehydration  Very dry mouth.  Extreme thirst.  Rapid, weak pulse (more than 100 beats per minute at rest).  Cold hands and feet.  Not able to sweat in spite of heat and temperature.  Rapid breathing.  Blue lips.  Confusion and lethargy.  Difficulty being awakened.  Minimal urine production.  No tears. DIAGNOSIS  Your caregiver will diagnose dehydration based on your symptoms and your exam. Blood and urine tests will help confirm the diagnosis. The diagnostic evaluation should also identify the cause of dehydration. TREATMENT  Treatment of mild or moderate dehydration can often be done at home by increasing the amount of fluids that you drink. It is best to drink small amounts of fluid more often. Drinking too much at one time can make vomiting worse. Refer to the home care instructions below. Severe dehydration needs to be treated at the hospital where you will probably be given intravenous (IV) fluids that contain water and electrolytes. HOME CARE INSTRUCTIONS   Ask your caregiver about specific rehydration instructions.  Drink enough fluids to keep your urine clear or pale yellow.  Drink small amounts frequently if you have nausea and vomiting.  Eat as you normally do.  Avoid:  Foods or drinks high in sugar.  Carbonated  drinks.  Juice.  Extremely hot or cold fluids.  Drinks with caffeine.  Fatty, greasy foods.  Alcohol.  Tobacco.  Overeating.  Gelatin desserts.  Wash your hands well to avoid spreading bacteria and viruses.  Only take over-the-counter or prescription medicines for pain, discomfort, or fever as directed by your caregiver.  Ask your caregiver if you should continue all prescribed and over-the-counter medicines.  Keep all follow-up appointments with your caregiver. SEEK MEDICAL CARE IF:  You have abdominal pain and it increases or stays in one area (localizes).  You have a rash, stiff neck, or severe headache.  You are irritable, sleepy, or difficult to awaken.  You are weak, dizzy, or extremely thirsty. SEEK IMMEDIATE MEDICAL CARE IF:   You are unable to keep fluids down or you get worse despite treatment.  You have frequent episodes of vomiting or diarrhea.  You have blood or green matter (bile) in your vomit.  You have blood in your stool or your stool looks black and tarry.  You have not urinated in 6 to 8 hours, or you have only urinated a small amount of very dark urine.  You have a fever.  You faint. MAKE SURE YOU:   Understand these instructions.  Will watch your condition.  Will get help right away if you are not doing well or get worse. Document Released: 09/27/2005 Document Revised: 12/20/2011 Document Reviewed: 05/17/2011 ExitCare Patient Information 2015 [employer]. This information is not intended to replace advice given to you by your health care provider. Make sure you discuss any questions you have with your health care   provider.  

## 2015-04-25 ENCOUNTER — Encounter: Payer: Self-pay | Admitting: *Deleted

## 2015-04-25 ENCOUNTER — Ambulatory Visit (HOSPITAL_BASED_OUTPATIENT_CLINIC_OR_DEPARTMENT_OTHER): Payer: Medicare Other

## 2015-04-25 VITALS — BP 114/61 | HR 70 | Temp 97.5°F | Resp 18

## 2015-04-25 DIAGNOSIS — K1231 Oral mucositis (ulcerative) due to antineoplastic therapy: Secondary | ICD-10-CM

## 2015-04-25 DIAGNOSIS — R110 Nausea: Secondary | ICD-10-CM

## 2015-04-25 DIAGNOSIS — R197 Diarrhea, unspecified: Secondary | ICD-10-CM

## 2015-04-25 DIAGNOSIS — C01 Malignant neoplasm of base of tongue: Secondary | ICD-10-CM | POA: Diagnosis not present

## 2015-04-25 DIAGNOSIS — R1111 Vomiting without nausea: Secondary | ICD-10-CM | POA: Diagnosis not present

## 2015-04-25 MED ORDER — HEPARIN SOD (PORK) LOCK FLUSH 100 UNIT/ML IV SOLN
500.0000 [IU] | Freq: Once | INTRAVENOUS | Status: AC | PRN
  Administered 2015-04-25: 500 [IU]
  Filled 2015-04-25: qty 5

## 2015-04-25 MED ORDER — SODIUM CHLORIDE 0.9 % IV SOLN
Freq: Once | INTRAVENOUS | Status: AC
  Administered 2015-04-25: 14:00:00 via INTRAVENOUS

## 2015-04-25 MED ORDER — SODIUM CHLORIDE 0.9 % IJ SOLN
10.0000 mL | INTRAMUSCULAR | Status: DC | PRN
  Administered 2015-04-25: 10 mL
  Filled 2015-04-25: qty 10

## 2015-04-25 NOTE — Progress Notes (Signed)
  Oncology Nurse Navigator Documentation   Navigator Encounter Type: Other (04/25/15 1330)      Patient arrived for IVF, called with request for/ I provided Medipore tape and drainage sponges.          Gayleen Orem, RN, BSN, [place] at [place] [phone]

## 2015-04-25 NOTE — Patient Instructions (Signed)

## 2015-05-02 ENCOUNTER — Ambulatory Visit (HOSPITAL_BASED_OUTPATIENT_CLINIC_OR_DEPARTMENT_OTHER): Payer: Medicare Other

## 2015-05-02 VITALS — BP 115/66 | HR 80 | Temp 98.7°F | Resp 17

## 2015-05-02 DIAGNOSIS — R1111 Vomiting without nausea: Secondary | ICD-10-CM

## 2015-05-02 DIAGNOSIS — R197 Diarrhea, unspecified: Secondary | ICD-10-CM

## 2015-05-02 DIAGNOSIS — C01 Malignant neoplasm of base of tongue: Secondary | ICD-10-CM | POA: Diagnosis not present

## 2015-05-02 DIAGNOSIS — R110 Nausea: Secondary | ICD-10-CM

## 2015-05-02 DIAGNOSIS — K1231 Oral mucositis (ulcerative) due to antineoplastic therapy: Secondary | ICD-10-CM

## 2015-05-02 MED ORDER — SODIUM CHLORIDE 0.9 % IV SOLN
Freq: Once | INTRAVENOUS | Status: AC
  Administered 2015-05-02: 14:00:00 via INTRAVENOUS

## 2015-05-02 MED ORDER — SODIUM CHLORIDE 0.9 % IJ SOLN
10.0000 mL | INTRAMUSCULAR | Status: DC | PRN
  Administered 2015-05-02: 10 mL
  Filled 2015-05-02: qty 10

## 2015-05-02 MED ORDER — HEPARIN SOD (PORK) LOCK FLUSH 100 UNIT/ML IV SOLN
500.0000 [IU] | Freq: Once | INTRAVENOUS | Status: AC | PRN
  Administered 2015-05-02: 500 [IU]
  Filled 2015-05-02: qty 5

## 2015-05-02 MED ORDER — PROMETHAZINE HCL 25 MG/ML IJ SOLN
12.5000 mg | Freq: Once | INTRAMUSCULAR | Status: DC
  Filled 2015-05-02: qty 1

## 2015-05-05 ENCOUNTER — Encounter: Payer: Self-pay | Admitting: *Deleted

## 2015-05-05 ENCOUNTER — Telehealth: Payer: Self-pay | Admitting: *Deleted

## 2015-05-05 ENCOUNTER — Ambulatory Visit (HOSPITAL_BASED_OUTPATIENT_CLINIC_OR_DEPARTMENT_OTHER): Payer: Medicare Other

## 2015-05-05 VITALS — BP 112/53 | HR 69 | Temp 98.5°F | Resp 16

## 2015-05-05 DIAGNOSIS — C01 Malignant neoplasm of base of tongue: Secondary | ICD-10-CM

## 2015-05-05 DIAGNOSIS — R110 Nausea: Secondary | ICD-10-CM

## 2015-05-05 DIAGNOSIS — K1231 Oral mucositis (ulcerative) due to antineoplastic therapy: Secondary | ICD-10-CM

## 2015-05-05 MED ORDER — ALTEPLASE 2 MG IJ SOLR
2.0000 mg | Freq: Once | INTRAMUSCULAR | Status: DC | PRN
Start: 2015-05-05 — End: 2015-05-05
  Filled 2015-05-05: qty 2

## 2015-05-05 MED ORDER — SODIUM CHLORIDE 0.9 % IV SOLN
Freq: Once | INTRAVENOUS | Status: AC
  Administered 2015-05-05: 13:00:00 via INTRAVENOUS

## 2015-05-05 MED ORDER — SODIUM CHLORIDE 0.9 % IJ SOLN
10.0000 mL | INTRAMUSCULAR | Status: DC | PRN
  Administered 2015-05-05: 10 mL
  Filled 2015-05-05: qty 10

## 2015-05-05 MED ORDER — HEPARIN SOD (PORK) LOCK FLUSH 100 UNIT/ML IV SOLN
500.0000 [IU] | Freq: Once | INTRAVENOUS | Status: AC | PRN
  Administered 2015-05-05: 500 [IU]
  Filled 2015-05-05: qty 5

## 2015-05-05 NOTE — Telephone Encounter (Signed)
  Oncology Nurse Navigator Documentation   Navigator Encounter Type: Telephone (05/05/15 4584)      Patient called stating he feels the need for IVF r/t recent hot weather.  Dr. Alvy Bimler notified.   Gayleen Orem, RN, BSN, [place] at [place] [phone]

## 2015-05-05 NOTE — Patient Instructions (Signed)
Dehydration, Adult Dehydration is when you lose more fluids from the body than you take in. Vital organs like the kidneys, brain, and heart cannot function without a proper amount of fluids and salt. Any loss of fluids from the body can cause dehydration.  CAUSES   Vomiting.  Diarrhea.  Excessive sweating.  Excessive urine output.  Fever. SYMPTOMS  Mild dehydration  Thirst.  Dry lips.  Slightly dry mouth. Moderate dehydration  Very dry mouth.  Sunken eyes.  Skin does not bounce back quickly when lightly pinched and released.  Dark urine and decreased urine production.  Decreased tear production.  Headache. Severe dehydration  Very dry mouth.  Extreme thirst.  Rapid, weak pulse (more than 100 beats per minute at rest).  Cold hands and feet.  Not able to sweat in spite of heat and temperature.  Rapid breathing.  Blue lips.  Confusion and lethargy.  Difficulty being awakened.  Minimal urine production.  No tears. DIAGNOSIS  Your caregiver will diagnose dehydration based on your symptoms and your exam. Blood and urine tests will help confirm the diagnosis. The diagnostic evaluation should also identify the cause of dehydration. TREATMENT  Treatment of mild or moderate dehydration can often be done at home by increasing the amount of fluids that you drink. It is best to drink small amounts of fluid more often. Drinking too much at one time can make vomiting worse. Refer to the home care instructions below. Severe dehydration needs to be treated at the hospital where you will probably be given intravenous (IV) fluids that contain water and electrolytes. HOME CARE INSTRUCTIONS   Ask your caregiver about specific rehydration instructions.  Drink enough fluids to keep your urine clear or pale yellow.  Drink small amounts frequently if you have nausea and vomiting.  Eat as you normally do.  Avoid:  Foods or drinks high in sugar.  Carbonated  drinks.  Juice.  Extremely hot or cold fluids.  Drinks with caffeine.  Fatty, greasy foods.  Alcohol.  Tobacco.  Overeating.  Gelatin desserts.  Wash your hands well to avoid spreading bacteria and viruses.  Only take over-the-counter or prescription medicines for pain, discomfort, or fever as directed by your caregiver.  Ask your caregiver if you should continue all prescribed and over-the-counter medicines.  Keep all follow-up appointments with your caregiver. SEEK MEDICAL CARE IF:  You have abdominal pain and it increases or stays in one area (localizes).  You have a rash, stiff neck, or severe headache.  You are irritable, sleepy, or difficult to awaken.  You are weak, dizzy, or extremely thirsty. SEEK IMMEDIATE MEDICAL CARE IF:   You are unable to keep fluids down or you get worse despite treatment.  You have frequent episodes of vomiting or diarrhea.  You have blood or green matter (bile) in your vomit.  You have blood in your stool or your stool looks black and tarry.  You have not urinated in 6 to 8 hours, or you have only urinated a small amount of very dark urine.  You have a fever.  You faint. MAKE SURE YOU:   Understand these instructions.  Will watch your condition.  Will get help right away if you are not doing well or get worse. Document Released: 09/27/2005 Document Revised: 12/20/2011 Document Reviewed: 05/17/2011 ExitCare Patient Information 2015 [employer]. This information is not intended to replace advice given to you by your health care provider. Make sure you discuss any questions you have with your health care   provider.  

## 2015-05-05 NOTE — Telephone Encounter (Signed)
  Oncology Nurse Navigator Documentation   Navigator Encounter Type: Telephone (05/05/15 3825)      Called patient to inform of IVF appts:  Today, 12:45, Friday 1345.  He verbalized understanding.   Gayleen Orem, RN, BSN, [place] at [place] [phone]

## 2015-05-07 ENCOUNTER — Other Ambulatory Visit: Payer: Self-pay | Admitting: *Deleted

## 2015-05-07 ENCOUNTER — Telehealth: Payer: Self-pay | Admitting: *Deleted

## 2015-05-07 ENCOUNTER — Other Ambulatory Visit: Payer: Self-pay | Admitting: Medical Oncology

## 2015-05-07 NOTE — Telephone Encounter (Signed)
Per Rik I have scheduled appt

## 2015-05-09 ENCOUNTER — Ambulatory Visit (HOSPITAL_BASED_OUTPATIENT_CLINIC_OR_DEPARTMENT_OTHER): Payer: Medicare Other

## 2015-05-09 VITALS — BP 127/58 | HR 68 | Temp 98.8°F | Resp 18

## 2015-05-09 DIAGNOSIS — R110 Nausea: Secondary | ICD-10-CM

## 2015-05-09 DIAGNOSIS — K1231 Oral mucositis (ulcerative) due to antineoplastic therapy: Secondary | ICD-10-CM

## 2015-05-09 DIAGNOSIS — C01 Malignant neoplasm of base of tongue: Secondary | ICD-10-CM

## 2015-05-09 MED ORDER — HEPARIN SOD (PORK) LOCK FLUSH 100 UNIT/ML IV SOLN
500.0000 [IU] | Freq: Once | INTRAVENOUS | Status: AC | PRN
  Administered 2015-05-09: 500 [IU]
  Filled 2015-05-09: qty 5

## 2015-05-09 MED ORDER — SODIUM CHLORIDE 0.9 % IJ SOLN
10.0000 mL | INTRAMUSCULAR | Status: DC | PRN
  Administered 2015-05-09: 10 mL
  Filled 2015-05-09: qty 10

## 2015-05-09 MED ORDER — SODIUM CHLORIDE 0.9 % IV SOLN
Freq: Once | INTRAVENOUS | Status: AC
  Administered 2015-05-09: 14:00:00 via INTRAVENOUS

## 2015-05-09 NOTE — Patient Instructions (Addendum)
For your constipation, you may take miralax once daily or Senna-Kot 2 tabs twice daily per Dr. Alvy Bimler. Both of these medications are over the counter.  Dehydration, Adult Dehydration means your body does not have as much fluid as it needs. Your kidneys, brain, and heart will not work properly without the right amount of fluids and salt.  HOME CARE  Ask your doctor how to replace body fluid losses (rehydrate).  Drink enough fluids to keep your pee (urine) clear or pale yellow.  Drink small amounts of fluids often if you feel sick to your stomach (nauseous) or throw up (vomit).  Eat like you normally do.  Avoid:  Foods or drinks high in sugar.  Bubbly (carbonated) drinks.  Juice.  Very hot or cold fluids.  Drinks with caffeine.  Fatty, greasy foods.  Alcohol.  Tobacco.  Eating too much.  Gelatin desserts.  Wash your hands to avoid spreading germs (bacteria, viruses).  Only take medicine as told by your doctor.  Keep all doctor visits as told. GET HELP RIGHT AWAY IF:   You cannot drink something without throwing up.  You get worse even with treatment.  Your vomit has blood in it or looks greenish.  Your poop (stool) has blood in it or looks black and tarry.  You have not peed in 6 to 8 hours.  You pee a small amount of very dark pee.  You have a fever.  You pass out (faint).  You have belly (abdominal) pain that gets worse or stays in one spot (localizes).  You have a rash, stiff neck, or bad headache.  You get easily annoyed, sleepy, or are hard to wake up.  You feel weak, dizzy, or very thirsty. MAKE SURE YOU:   Understand these instructions.  Will watch your condition.  Will get help right away if you are not doing well or get worse. Document Released: 07/24/2009 Document Revised: 12/20/2011 Document Reviewed: 05/17/2011 [facility] Patient Information 2015 [place], [place]. This information is not intended to replace advice given to you by your  health care provider. Make sure you discuss any questions you have with your health care provider.

## 2015-05-13 ENCOUNTER — Ambulatory Visit (HOSPITAL_BASED_OUTPATIENT_CLINIC_OR_DEPARTMENT_OTHER): Payer: Medicare Other

## 2015-05-13 VITALS — BP 114/99 | HR 93 | Temp 98.5°F | Resp 18

## 2015-05-13 DIAGNOSIS — R110 Nausea: Secondary | ICD-10-CM

## 2015-05-13 DIAGNOSIS — K1231 Oral mucositis (ulcerative) due to antineoplastic therapy: Secondary | ICD-10-CM

## 2015-05-13 DIAGNOSIS — C01 Malignant neoplasm of base of tongue: Secondary | ICD-10-CM | POA: Diagnosis not present

## 2015-05-13 MED ORDER — HEPARIN SOD (PORK) LOCK FLUSH 100 UNIT/ML IV SOLN
500.0000 [IU] | Freq: Once | INTRAVENOUS | Status: AC | PRN
  Administered 2015-05-13: 500 [IU]
  Filled 2015-05-13: qty 5

## 2015-05-13 MED ORDER — SODIUM CHLORIDE 0.9 % IV SOLN
Freq: Once | INTRAVENOUS | Status: AC
  Administered 2015-05-13: 14:00:00 via INTRAVENOUS

## 2015-05-13 MED ORDER — SODIUM CHLORIDE 0.9 % IJ SOLN
10.0000 mL | INTRAMUSCULAR | Status: DC | PRN
  Administered 2015-05-13: 10 mL
  Filled 2015-05-13: qty 10

## 2015-05-13 NOTE — Patient Instructions (Signed)

## 2015-05-14 ENCOUNTER — Telehealth: Payer: Self-pay | Admitting: *Deleted

## 2015-05-14 NOTE — Telephone Encounter (Signed)
Asked pt if we can r/s his appt w/ Dr. Alvy Bimler from this Friday to next Tuesday 8/9 at 1:30 pm.  Pt says that will be ok, he says he is doing "really good".  He is swallowing a lot more foods and able to drink a whole glass of milk no problem.  He has not had to use any pain medication in a while.  He will see Dr. Alvy Bimler next Tuesday at 1:30 pm.

## 2015-05-16 ENCOUNTER — Ambulatory Visit: Payer: Medicare Other | Attending: Radiation Oncology

## 2015-05-16 ENCOUNTER — Ambulatory Visit: Payer: Self-pay | Admitting: Hematology and Oncology

## 2015-05-16 ENCOUNTER — Ambulatory Visit (HOSPITAL_BASED_OUTPATIENT_CLINIC_OR_DEPARTMENT_OTHER): Payer: Medicare Other

## 2015-05-16 VITALS — BP 117/60 | HR 71 | Temp 99.0°F | Resp 16

## 2015-05-16 DIAGNOSIS — R1310 Dysphagia, unspecified: Secondary | ICD-10-CM | POA: Diagnosis present

## 2015-05-16 DIAGNOSIS — C01 Malignant neoplasm of base of tongue: Secondary | ICD-10-CM

## 2015-05-16 DIAGNOSIS — R110 Nausea: Secondary | ICD-10-CM

## 2015-05-16 DIAGNOSIS — K1231 Oral mucositis (ulcerative) due to antineoplastic therapy: Secondary | ICD-10-CM

## 2015-05-16 MED ORDER — HEPARIN SOD (PORK) LOCK FLUSH 100 UNIT/ML IV SOLN
500.0000 [IU] | Freq: Once | INTRAVENOUS | Status: AC | PRN
  Administered 2015-05-16: 500 [IU]
  Filled 2015-05-16: qty 5

## 2015-05-16 MED ORDER — SODIUM CHLORIDE 0.9 % IJ SOLN
10.0000 mL | INTRAMUSCULAR | Status: DC | PRN
  Administered 2015-05-16: 10 mL
  Filled 2015-05-16: qty 10

## 2015-05-16 MED ORDER — SODIUM CHLORIDE 0.9 % IV SOLN
Freq: Once | INTRAVENOUS | Status: AC
  Administered 2015-05-16: 14:00:00 via INTRAVENOUS

## 2015-05-16 NOTE — Patient Instructions (Signed)
Dehydration, Adult Dehydration is when you lose more fluids from the body than you take in. Vital organs like the kidneys, brain, and heart cannot function without a proper amount of fluids and salt. Any loss of fluids from the body can cause dehydration.  CAUSES   Vomiting.  Diarrhea.  Excessive sweating.  Excessive urine output.  Fever. SYMPTOMS  Mild dehydration  Thirst.  Dry lips.  Slightly dry mouth. Moderate dehydration  Very dry mouth.  Sunken eyes.  Skin does not bounce back quickly when lightly pinched and released.  Dark urine and decreased urine production.  Decreased tear production.  Headache. Severe dehydration  Very dry mouth.  Extreme thirst.  Rapid, weak pulse (more than 100 beats per minute at rest).  Cold hands and feet.  Not able to sweat in spite of heat and temperature.  Rapid breathing.  Blue lips.  Confusion and lethargy.  Difficulty being awakened.  Minimal urine production.  No tears. DIAGNOSIS  Your caregiver will diagnose dehydration based on your symptoms and your exam. Blood and urine tests will help confirm the diagnosis. The diagnostic evaluation should also identify the cause of dehydration. TREATMENT  Treatment of mild or moderate dehydration can often be done at home by increasing the amount of fluids that you drink. It is best to drink small amounts of fluid more often. Drinking too much at one time can make vomiting worse. Refer to the home care instructions below. Severe dehydration needs to be treated at the hospital where you will probably be given intravenous (IV) fluids that contain water and electrolytes. HOME CARE INSTRUCTIONS   Ask your caregiver about specific rehydration instructions.  Drink enough fluids to keep your urine clear or pale yellow.  Drink small amounts frequently if you have nausea and vomiting.  Eat as you normally do.  Avoid:  Foods or drinks high in sugar.  Carbonated  drinks.  Juice.  Extremely hot or cold fluids.  Drinks with caffeine.  Fatty, greasy foods.  Alcohol.  Tobacco.  Overeating.  Gelatin desserts.  Wash your hands well to avoid spreading bacteria and viruses.  Only take over-the-counter or prescription medicines for pain, discomfort, or fever as directed by your caregiver.  Ask your caregiver if you should continue all prescribed and over-the-counter medicines.  Keep all follow-up appointments with your caregiver. SEEK MEDICAL CARE IF:  You have abdominal pain and it increases or stays in one area (localizes).  You have a rash, stiff neck, or severe headache.  You are irritable, sleepy, or difficult to awaken.  You are weak, dizzy, or extremely thirsty. SEEK IMMEDIATE MEDICAL CARE IF:   You are unable to keep fluids down or you get worse despite treatment.  You have frequent episodes of vomiting or diarrhea.  You have blood or green matter (bile) in your vomit.  You have blood in your stool or your stool looks black and tarry.  You have not urinated in 6 to 8 hours, or you have only urinated a small amount of very dark urine.  You have a fever.  You faint. MAKE SURE YOU:   Understand these instructions.  Will watch your condition.  Will get help right away if you are not doing well or get worse. Document Released: 09/27/2005 Document Revised: 12/20/2011 Document Reviewed: 05/17/2011 ExitCare Patient Information 2015 [employer]. This information is not intended to replace advice given to you by your health care provider. Make sure you discuss any questions you have with your health care   provider.  

## 2015-05-16 NOTE — Patient Instructions (Signed)
Do your exercises until 07-21-15, then 2-3 times a week after that.  Consider reducing your tube feeds and increasing your food by mouth.

## 2015-05-16 NOTE — Therapy (Signed)
[facility] [address] [place], [place], [address] Phone: [phone]   Fax:  [phone]  Speech Language Pathology Treatment  Patient Details  Name: Nicholas Wilkerson MRN: [id number] Date of Birth: 1940/11/05 Referring Provider:  Laurey Morale, MD  Encounter Date: 05/16/2015      End of Session - 05/16/15 1042    Visit Number 5   Number of Visits 5   Date for SLP Re-Evaluation 05/12/15      Past Medical History  Diagnosis Date  . Hypertension   . Dilated cardiomyopathy   . Hypercholesterolemia   . DIVERTICULOSIS, COLON 12/08/2007  . ARTHRITIS 10/22/2008  . History of echocardiogram 05/22/2007    EF was 45-50% / Mild concentric LV hypertrophy with mild global hypokinesis and overall mild systolic dysfunction .  Mild AV sclerosis / Mild Mitral insufficiency / compared to prior study 04/24/02 -- LV function has improved further.    . CHF (congestive heart failure)     sees Dr. Peter [name]   . BPH (benign prostatic hyperplasia)   . Squamous cell carcinoma 11/20/14    base of tongue primary  . Skin cancer     squamous cell, basal cell  . H/O asbestos exposure   . Glaucoma   . Radiation 01/01/15-02/18/15    base of tongue and bilateral neck 70 Gy    Past Surgical History  Procedure Laterality Date  . Cardiac catheterization  10/17/2001    EF estimated at 30% / moderate LV enlargement  / 1. Minimal nonobstructive atherosclerotic coronary artery disease / 2. Severe LV dysfunction with global hypokinesia consistent with dilated nonischemic cardiomyopath / 3. Moderate pulmonary hypertension  . Colonoscopy  08-15-07    per Dr. Carlean Purl, clear , repeat in 10 yrs  . Lymph node biopsy      There were no vitals filed for this visit.  Visit Diagnosis: Dysphagia      Subjective Assessment - 05/16/15 1025    Subjective Pt's variety of foods has incr'd dramatically since last visit. Pt is still on 4 cans tube feed per  day.               ADULT SLP TREATMENT - 05/16/15 1026    General Information   Behavior/Cognition Alert;Cooperative;Pleasant mood   Treatment Provided   Treatment provided Dysphagia   Dysphagia Treatment   Temperature Spikes Noted No   Patient observed directly with PO's Yes   Other treatment/comments Pt completing HEP throughout the day, states he likely does more reps than SLP suggests. With POs today of cereal bar and water, skilled SLP observation noted no overt s/s symptoms. Pt reports needing liquid wash with POs, SLP confirmed this was fine as pt reports more difficulty with "stringy" items or drier items. SLP encouraged pt to have gravies, jellies, and sauces with drier items. HEP completed with supervision cues    Pain Assessment   Pain Assessment No/denies pain   Assessment / Recommendations / Plan   Plan --  Pt to call if he feels he needs appt Sept, schedule for Oct   Dysphagia Recommendations   Diet recommendations Thin liquid  as tolerated   Liquids provided via Cup   Compensations Slow rate;Small sips/bites;Follow solids with liquid   Progression Toward Goals   Progression toward goals Progressing toward goals     Pt is doing well with HEP and with safety with POs. SLP would like to see pt incr POs and decr tube feeds in  order to incr swallow muscle use. His adherence to HEP has improved greatly over the last 2-3 visits. He is to meet with Dr. Alvy Bimler and Dory Peru on Tuesday.       SLP Short Term Goals - 05/16/15 1046    SLP SHORT TERM GOAL #1   Title pt will complete HEP with rare min A   Time 1   Period --  visit   Status Achieved   SLP SHORT TERM GOAL #2   Title pt will tell SLP why he is completing HEP   Time 1   Period --  visit   Status Achieved          SLP Long Term Goals - 05/16/15 1047    SLP LONG TERM GOAL #1   Title pt will complete HEP with modified independence over two sessions   Status Partially Met  goal not met 03-13-15    SLP LONG TERM GOAL #2   Title pt will tell SLP three signs/symptoms aspiratiion PNA with modified independence   Status Achieved  goal not met 03-13-15   SLP LONG TERM GOAL #3   Title pt to tell SLP how a food journal can assist in return to [place] #4   Title pt will maintain ability to perform HEP with modified independence over a period of two months   Time 2   Period Months   Status New   SLP LONG TERM GOAL #5   Title pt will maintain swallowing safety while consuming POs with copmensations PRN, during ST sessions   Time 2   Period Months   Status New          Plan - 05/16/15 1045    Clinical Impression Statement Cont'd skilled ST remains necessary to assess swallowing safety with POs and to assess performance with HEP. SLP encouraged pt today to incr POs and decr tube feeds. To meet with Dr. Alvy Bimler and with Dory Peru Tuesday 05-20-15.   Speech Therapy Frequency --  approx every 4 weeks   Duration --  for 60 days (until 07-15-15)   Treatment/Interventions Pharyngeal strengthening exercises;Oral motor exercises;Compensatory strategies;Patient/family education;SLP instruction and feedback   Potential to Achieve Goals Good        Problem List Patient Active Problem List   Diagnosis Date Noted  . Protein calorie malnutrition 03/24/2015  . Depression, acute 03/24/2015  . Dehydration 01/24/2015  . Palpitations 01/24/2015  . Leukopenia due to antineoplastic chemotherapy 01/21/2015  . Mucositis due to chemotherapy 01/14/2015  . Chemotherapy induced nausea and vomiting 01/07/2015  . Weight loss 01/07/2015  . History of skin cancer 12/03/2014  . Cancer of base of tongue 12/02/2014  . Chronic systolic CHF (congestive heart failure) 07/12/2013  . MUSCLE STRAIN, ABDOMINAL WALL 11/28/2008  . ARTHRITIS 10/22/2008  . BURSITIS, LEFT SHOULDER 07/24/2008  . INTERNAL HEMORRHOIDS 12/08/2007  . EXTERNAL HEMORRHOIDS 12/08/2007  . DIVERTICULOSIS, COLON  12/08/2007  . RECTAL BLEEDING 12/08/2007  . CONGESTIVE HEART FAILURE, HX OF 12/08/2007  . Congestive dilated cardiomyopathy 06/13/2007  . HX, PERSONAL, ARTHRITIS 06/13/2007  . Hypercholesterolemia 05/15/2007  . Essential hypertension 05/15/2007    [facility] , [place], [place]  05/16/2015, 10:50 AM  [facility] [address] [place], [place], [address] Phone: [phone]   Fax:  [phone]

## 2015-05-20 ENCOUNTER — Ambulatory Visit (HOSPITAL_BASED_OUTPATIENT_CLINIC_OR_DEPARTMENT_OTHER): Payer: Medicare Other | Admitting: Hematology and Oncology

## 2015-05-20 ENCOUNTER — Encounter: Payer: Self-pay | Admitting: Hematology and Oncology

## 2015-05-20 ENCOUNTER — Ambulatory Visit (HOSPITAL_BASED_OUTPATIENT_CLINIC_OR_DEPARTMENT_OTHER): Payer: Medicare Other

## 2015-05-20 VITALS — BP 126/69 | HR 70 | Temp 98.7°F | Resp 18 | Ht 69.0 in | Wt 157.1 lb

## 2015-05-20 DIAGNOSIS — E860 Dehydration: Secondary | ICD-10-CM | POA: Diagnosis not present

## 2015-05-20 DIAGNOSIS — R110 Nausea: Secondary | ICD-10-CM

## 2015-05-20 DIAGNOSIS — C01 Malignant neoplasm of base of tongue: Secondary | ICD-10-CM

## 2015-05-20 DIAGNOSIS — E46 Unspecified protein-calorie malnutrition: Secondary | ICD-10-CM | POA: Diagnosis not present

## 2015-05-20 DIAGNOSIS — K1231 Oral mucositis (ulcerative) due to antineoplastic therapy: Secondary | ICD-10-CM

## 2015-05-20 MED ORDER — SODIUM CHLORIDE 0.9 % IJ SOLN
10.0000 mL | INTRAMUSCULAR | Status: DC | PRN
  Administered 2015-05-20: 10 mL
  Filled 2015-05-20: qty 10

## 2015-05-20 MED ORDER — HEPARIN SOD (PORK) LOCK FLUSH 100 UNIT/ML IV SOLN
250.0000 [IU] | Freq: Once | INTRAVENOUS | Status: DC | PRN
  Filled 2015-05-20: qty 5

## 2015-05-20 MED ORDER — SODIUM CHLORIDE 0.9 % IV SOLN
Freq: Once | INTRAVENOUS | Status: AC
  Administered 2015-05-20: 15:00:00 via INTRAVENOUS

## 2015-05-20 MED ORDER — HEPARIN SOD (PORK) LOCK FLUSH 100 UNIT/ML IV SOLN
500.0000 [IU] | Freq: Once | INTRAVENOUS | Status: AC | PRN
  Administered 2015-05-20: 500 [IU]
  Filled 2015-05-20: qty 5

## 2015-05-20 NOTE — Patient Instructions (Signed)
Dehydration, Adult Dehydration is when you lose more fluids from the body than you take in. Vital organs like the kidneys, brain, and heart cannot function without a proper amount of fluids and salt. Any loss of fluids from the body can cause dehydration.  CAUSES   Vomiting.  Diarrhea.  Excessive sweating.  Excessive urine output.  Fever. SYMPTOMS  Mild dehydration  Thirst.  Dry lips.  Slightly dry mouth. Moderate dehydration  Very dry mouth.  Sunken eyes.  Skin does not bounce back quickly when lightly pinched and released.  Dark urine and decreased urine production.  Decreased tear production.  Headache. Severe dehydration  Very dry mouth.  Extreme thirst.  Rapid, weak pulse (more than 100 beats per minute at rest).  Cold hands and feet.  Not able to sweat in spite of heat and temperature.  Rapid breathing.  Blue lips.  Confusion and lethargy.  Difficulty being awakened.  Minimal urine production.  No tears. DIAGNOSIS  Your caregiver will diagnose dehydration based on your symptoms and your exam. Blood and urine tests will help confirm the diagnosis. The diagnostic evaluation should also identify the cause of dehydration. TREATMENT  Treatment of mild or moderate dehydration can often be done at home by increasing the amount of fluids that you drink. It is best to drink small amounts of fluid more often. Drinking too much at one time can make vomiting worse. Refer to the home care instructions below. Severe dehydration needs to be treated at the hospital where you will probably be given intravenous (IV) fluids that contain water and electrolytes. HOME CARE INSTRUCTIONS   Ask your caregiver about specific rehydration instructions.  Drink enough fluids to keep your urine clear or pale yellow.  Drink small amounts frequently if you have nausea and vomiting.  Eat as you normally do.  Avoid:  Foods or drinks high in sugar.  Carbonated  drinks.  Juice.  Extremely hot or cold fluids.  Drinks with caffeine.  Fatty, greasy foods.  Alcohol.  Tobacco.  Overeating.  Gelatin desserts.  Wash your hands well to avoid spreading bacteria and viruses.  Only take over-the-counter or prescription medicines for pain, discomfort, or fever as directed by your caregiver.  Ask your caregiver if you should continue all prescribed and over-the-counter medicines.  Keep all follow-up appointments with your caregiver. SEEK MEDICAL CARE IF:  You have abdominal pain and it increases or stays in one area (localizes).  You have a rash, stiff neck, or severe headache.  You are irritable, sleepy, or difficult to awaken.  You are weak, dizzy, or extremely thirsty. SEEK IMMEDIATE MEDICAL CARE IF:   You are unable to keep fluids down or you get worse despite treatment.  You have frequent episodes of vomiting or diarrhea.  You have blood or green matter (bile) in your vomit.  You have blood in your stool or your stool looks black and tarry.  You have not urinated in 6 to 8 hours, or you have only urinated a small amount of very dark urine.  You have a fever.  You faint. MAKE SURE YOU:   Understand these instructions.  Will watch your condition.  Will get help right away if you are not doing well or get worse. Document Released: 09/27/2005 Document Revised: 12/20/2011 Document Reviewed: 05/17/2011 ExitCare Patient Information 2015 [employer]. This information is not intended to replace advice given to you by your health care provider. Make sure you discuss any questions you have with your health care   provider.  

## 2015-05-21 NOTE — Assessment & Plan Note (Signed)
He is gaining weight and is able to tolerate all the nutritional supplement. I will consult dietitian to follow. I encouraged him to increase oral intake as tolerated. If he is able to get his weight closer to 165 pounds, we will get his feeding tube removed.

## 2015-05-21 NOTE — Assessment & Plan Note (Signed)
His dehydration has improved with IV fluid. I encouraged oral intake as tolerated

## 2015-05-21 NOTE — Progress Notes (Signed)
[place] OFFICE PROGRESS NOTE  Patient Care Team: Laurey Morale, MD as PCP - General (Family Medicine) Heath Lark, MD as Consulting Physician (Hematology and Oncology) Melissa Montane, MD as Consulting Physician (Otolaryngology)  SUMMARY OF ONCOLOGIC HISTORY: Oncology History   Cancer of base of tongue   Staging form: Lip and Oral Cavity, AJCC 7th Edition     Clinical stage from 12/03/2014: Stage IVA (T2, N2a, M0) - Signed by Heath Lark, MD on 12/13/2014 HPV negative           Cancer of base of tongue   11/20/2014 Pathology Results Accession: [id number] confirm squamous cell carcinoma.   11/20/2014 Procedure The patient underwent fine-needle aspirate of the right lymph node   12/02/2014 Imaging CT scan showed 2.2 cm the right tongue base mass with necrotic 4 cm right level II nodal metastasis with multiple bilateral small lymphadenopathy   12/10/2014 Imaging PET CT scan showed tongue base mass with right level II lymph node metastasis   12/24/2014 Procedure He has placement of port and feeding tube   01/01/2015 - 02/12/2015 Chemotherapy He received high dose cisplatin   01/01/2015 - 02/18/2015 Radiation Therapy Rec'd concurrent RT to base of tongue and bilateral neck:  70 Gy in 35 fractions to gross disease, 63 Gy in 35 fractions to high risk nodal echelons, 56 Gy in 35 fractions to intermediate risk nodal echelons.   01/21/2015 Adverse Reaction  cycle 2 chemotherapy dose will be reduced by 25% due to intolerable side effects   02/11/2015 Adverse Reaction Cycle 3 of chemotherapy dose will be reduced to 50% due to intolerable side effects    INTERVAL HISTORY: Please see below for problem oriented charting. He returns for further follow-up. He is doing well and is eating better. He is still dependent on nutritional supplements daily. He denies any choking sensation. Denies new lymphadenopathy.  REVIEW OF SYSTEMS:   Constitutional: Denies fevers, chills or abnormal weight  loss Eyes: Denies blurriness of vision Ears, nose, mouth, throat, and face: Denies mucositis or sore throat Respiratory: Denies cough, dyspnea or wheezes Cardiovascular: Denies palpitation, chest discomfort or lower extremity swelling Gastrointestinal:  Denies nausea, heartburn or change in bowel habits Skin: Denies abnormal skin rashes Lymphatics: Denies new lymphadenopathy or easy bruising Neurological:Denies numbness, tingling or new weaknesses Behavioral/Psych: Mood is stable, no new changes  All other systems were reviewed with the patient and are negative.  I have reviewed the past medical history, past surgical history, social history and family history with the patient and they are unchanged from previous note.  ALLERGIES:  has No Known Allergies.  MEDICATIONS:  Current Outpatient Prescriptions  Medication Sig Dispense Refill  . carvedilol (COREG) 25 MG tablet Take 1 tablet (25 mg total) by mouth 2 (two) times daily with a meal. 180 tablet 3  . LORazepam (ATIVAN) 0.5 MG tablet TAKE 1 TABLET BY MOUTH UP TO 3 TIMES A DAY AS NEEDED FOR ANXIETY, NAUSEA, OR CLAUSTROPHOBIA. TAKE 20 MINUTES BEFORE WEARING MASK. 60 tablet 0  . mirtazapine (REMERON) 15 MG tablet Take 1 tablet (15 mg total) by mouth at bedtime. 30 tablet 3  . morphine (ROXANOL) 20 MG/ML concentrated solution Take 1 mL (20 mg total) by mouth every 2 (two) hours as needed for severe pain. 240 mL 0  . Nutritional Supplements (FEEDING SUPPLEMENT, OSMOLITE 1.5 CAL,) LIQD Give 1.5 cans of Osmolite 1.5 via PEG QID with 60 cc free water before and after bolus feedings. Drink or flush tube with an  additional 240 cc water TID between feedings. 1422 mL 0  . sodium fluoride (FLUORISHIELD) 1.1 % GEL dental gel Instill one drop of gel per tooth space of fluoride tray. Place over teeth for 5 minutes. Remove. Spit out excess. Repeat nightly. 120 mL prn  . tadalafil (CIALIS) 20 MG tablet Take 1 tablet (20 mg total) by mouth daily as needed for  erectile dysfunction. 30 tablet 3  . tamsulosin (FLOMAX) 0.4 MG CAPS capsule Take 1 capsule (0.4 mg total) by mouth daily. 30 capsule 5  . TRAVATAN Z 0.004 % SOLN ophthalmic solution Place 1 drop into both eyes at bedtime.      No current facility-administered medications for this visit.   Facility-Administered Medications Ordered in Other Visits  Medication Dose Route Frequency Provider Last Rate Last Dose  . sodium chloride 0.9 % injection 10 mL  10 mL Intracatheter PRN Heath Lark, MD   10 mL at 04/07/15 1627    PHYSICAL EXAMINATION: ECOG PERFORMANCE STATUS: 0 - Asymptomatic  Filed Vitals:   05/20/15 1337  BP: 126/69  Pulse: 70  Temp: 98.7 F (37.1 C)  Resp: 18   Filed Weights   05/20/15 1337  Weight: 157 lb 1.6 oz (71.26 kg)    GENERAL:alert, no distress and comfortable SKIN: skin color, texture, turgor are normal, no rashes or significant lesions EYES: normal, Conjunctiva are pink and non-injected, sclera clear OROPHARYNX:no exudate, no erythema and lips, buccal mucosa, and tongue normal  ABDOMEN:abdomen soft, non-tender and normal bowel sounds. Feeding tube site looks okay Musculoskeletal:no cyanosis of digits and no clubbing  NEURO: alert & oriented x 3 with fluent speech, no focal motor/sensory deficits  LABORATORY DATA:  I have reviewed the data as listed    Component Value Date/Time   NA 140 04/16/2015 1302   NA 139 04/14/2015 1042   K 4.2 04/16/2015 1302   K 3.8 04/14/2015 1042   CL 100* 04/14/2015 1042   CO2 27 04/16/2015 1302   CO2 26 04/14/2015 1042   GLUCOSE 134 04/16/2015 1302   GLUCOSE 172* 04/14/2015 1042   BUN 29.1* 04/16/2015 1302   BUN 23* 04/14/2015 1042   CREATININE 1.2 04/16/2015 1302   CREATININE 1.49* 04/14/2015 1042   CALCIUM 9.6 04/16/2015 1302   CALCIUM 10.8* 04/14/2015 1042   PROT 6.6 04/16/2015 1302   PROT 8.9* 04/14/2015 1042   ALBUMIN 3.7 04/16/2015 1302   ALBUMIN 4.9 04/14/2015 1042   AST 31 04/16/2015 1302   AST 24  04/14/2015 1042   ALT 28 04/16/2015 1302   ALT 19 04/14/2015 1042   ALKPHOS 65 04/16/2015 1302   ALKPHOS 92 04/14/2015 1042   BILITOT 0.46 04/16/2015 1302   BILITOT 0.8 04/14/2015 1042   GFRNONAA 45* 04/14/2015 1042   GFRAA 53* 04/14/2015 1042    No results found for: SPEP, UPEP  Lab Results  Component Value Date   WBC 8.8 04/14/2015   NEUTROABS 7.4 04/14/2015   HGB 16.2 04/14/2015   HCT 47.2 04/14/2015   MCV 91.8 04/14/2015   PLT 257 04/14/2015      Chemistry      Component Value Date/Time   NA 140 04/16/2015 1302   NA 139 04/14/2015 1042   K 4.2 04/16/2015 1302   K 3.8 04/14/2015 1042   CL 100* 04/14/2015 1042   CO2 27 04/16/2015 1302   CO2 26 04/14/2015 1042   BUN 29.1* 04/16/2015 1302   BUN 23* 04/14/2015 1042   CREATININE 1.2 04/16/2015 1302   CREATININE  1.49* 04/14/2015 1042      Component Value Date/Time   CALCIUM 9.6 04/16/2015 1302   CALCIUM 10.8* 04/14/2015 1042   ALKPHOS 65 04/16/2015 1302   ALKPHOS 92 04/14/2015 1042   AST 31 04/16/2015 1302   AST 24 04/14/2015 1042   ALT 28 04/16/2015 1302   ALT 19 04/14/2015 1042   BILITOT 0.46 04/16/2015 1302   BILITOT 0.8 04/14/2015 1042      ASSESSMENT & PLAN:  Cancer of base of tongue He tolerated cycle one poorly due to nausea. He had significant modification to cycle 2 and cycle 3 of therapy. I recommend we continue IV fluid support for a few more times. Since he is feeling better, I will space out his appointment and stop IVF after next week. I continue to encourage him to increase his oral intake as tolerated. He has successfully weaned himself off pain medicine. Imaging studies are ordered for next month. If he has complete response to treatment, we will remove his feeding tube and port  Dehydration His dehydration has improved with IV fluid. I encouraged oral intake as tolerated     Protein calorie malnutrition He is gaining weight and is able to tolerate all the nutritional supplement. I  will consult dietitian to follow. I encouraged him to increase oral intake as tolerated. If he is able to get his weight closer to 165 pounds, we will get his feeding tube removed.     No orders of the defined types were placed in this encounter.   All questions were answered. The patient knows to call the clinic with any problems, questions or concerns. No barriers to learning was detected. I spent 15 minutes counseling the patient face to face. The total time spent in the appointment was 20 minutes and more than 50% was on counseling and review of test results     [facility], [place], MD 05/21/2015 2:49 PM

## 2015-05-21 NOTE — Assessment & Plan Note (Addendum)
He tolerated cycle one poorly due to nausea. He had significant modification to cycle 2 and cycle 3 of therapy. I recommend we continue IV fluid support for a few more times. Since he is feeling better, I will space out his appointment and stop IVF after next week. I continue to encourage him to increase his oral intake as tolerated. He has successfully weaned himself off pain medicine. Imaging studies are ordered for next month. If he has complete response to treatment, we will remove his feeding tube and port

## 2015-05-23 ENCOUNTER — Ambulatory Visit (HOSPITAL_BASED_OUTPATIENT_CLINIC_OR_DEPARTMENT_OTHER): Payer: Medicare Other

## 2015-05-23 VITALS — BP 119/70 | HR 61 | Temp 98.7°F | Resp 18

## 2015-05-23 DIAGNOSIS — C01 Malignant neoplasm of base of tongue: Secondary | ICD-10-CM

## 2015-05-23 DIAGNOSIS — R110 Nausea: Secondary | ICD-10-CM | POA: Diagnosis not present

## 2015-05-23 DIAGNOSIS — K1231 Oral mucositis (ulcerative) due to antineoplastic therapy: Secondary | ICD-10-CM

## 2015-05-23 MED ORDER — SODIUM CHLORIDE 0.9 % IV SOLN
Freq: Once | INTRAVENOUS | Status: AC
  Administered 2015-05-23: 14:00:00 via INTRAVENOUS

## 2015-05-23 MED ORDER — HEPARIN SOD (PORK) LOCK FLUSH 100 UNIT/ML IV SOLN
500.0000 [IU] | Freq: Once | INTRAVENOUS | Status: AC | PRN
  Administered 2015-05-23: 500 [IU]
  Filled 2015-05-23: qty 5

## 2015-05-23 MED ORDER — SODIUM CHLORIDE 0.9 % IJ SOLN
10.0000 mL | INTRAMUSCULAR | Status: DC | PRN
  Administered 2015-05-23: 10 mL
  Filled 2015-05-23: qty 10

## 2015-05-23 NOTE — Progress Notes (Signed)
Patient reports having diarrhea x 3 days. States this has improved. Reports only 1 episode of diarrhea today.  Pt encouraged to take imodium if diarrhea worsens.  Pt also reports a pain/burning sensation to the bottoms of both feet. [place], Dr. Calton Dach RN notified.  Patient informed to call clinic if symptoms do not improve over the weekend. Patient verbalized understanding.

## 2015-05-23 NOTE — Patient Instructions (Signed)
Dehydration, Adult Dehydration is when you lose more fluids from the body than you take in. Vital organs like the kidneys, brain, and heart cannot function without a proper amount of fluids and salt. Any loss of fluids from the body can cause dehydration.  CAUSES   Vomiting.  Diarrhea.  Excessive sweating.  Excessive urine output.  Fever. SYMPTOMS  Mild dehydration  Thirst.  Dry lips.  Slightly dry mouth. Moderate dehydration  Very dry mouth.  Sunken eyes.  Skin does not bounce back quickly when lightly pinched and released.  Dark urine and decreased urine production.  Decreased tear production.  Headache. Severe dehydration  Very dry mouth.  Extreme thirst.  Rapid, weak pulse (more than 100 beats per minute at rest).  Cold hands and feet.  Not able to sweat in spite of heat and temperature.  Rapid breathing.  Blue lips.  Confusion and lethargy.  Difficulty being awakened.  Minimal urine production.  No tears. DIAGNOSIS  Your caregiver will diagnose dehydration based on your symptoms and your exam. Blood and urine tests will help confirm the diagnosis. The diagnostic evaluation should also identify the cause of dehydration. TREATMENT  Treatment of mild or moderate dehydration can often be done at home by increasing the amount of fluids that you drink. It is best to drink small amounts of fluid more often. Drinking too much at one time can make vomiting worse. Refer to the home care instructions below. Severe dehydration needs to be treated at the hospital where you will probably be given intravenous (IV) fluids that contain water and electrolytes. HOME CARE INSTRUCTIONS   Ask your caregiver about specific rehydration instructions.  Drink enough fluids to keep your urine clear or pale yellow.  Drink small amounts frequently if you have nausea and vomiting.  Eat as you normally do.  Avoid:  Foods or drinks high in sugar.  Carbonated  drinks.  Juice.  Extremely hot or cold fluids.  Drinks with caffeine.  Fatty, greasy foods.  Alcohol.  Tobacco.  Overeating.  Gelatin desserts.  Wash your hands well to avoid spreading bacteria and viruses.  Only take over-the-counter or prescription medicines for pain, discomfort, or fever as directed by your caregiver.  Ask your caregiver if you should continue all prescribed and over-the-counter medicines.  Keep all follow-up appointments with your caregiver. SEEK MEDICAL CARE IF:  You have abdominal pain and it increases or stays in one area (localizes).  You have a rash, stiff neck, or severe headache.  You are irritable, sleepy, or difficult to awaken.  You are weak, dizzy, or extremely thirsty. SEEK IMMEDIATE MEDICAL CARE IF:   You are unable to keep fluids down or you get worse despite treatment.  You have frequent episodes of vomiting or diarrhea.  You have blood or green matter (bile) in your vomit.  You have blood in your stool or your stool looks black and tarry.  You have not urinated in 6 to 8 hours, or you have only urinated a small amount of very dark urine.  You have a fever.  You faint. MAKE SURE YOU:   Understand these instructions.  Will watch your condition.  Will get help right away if you are not doing well or get worse. Document Released: 09/27/2005 Document Revised: 12/20/2011 Document Reviewed: 05/17/2011 ExitCare Patient Information 2015 [employer]. This information is not intended to replace advice given to you by your health care provider. Make sure you discuss any questions you have with your health care   provider.  

## 2015-05-27 ENCOUNTER — Encounter: Payer: Self-pay | Admitting: *Deleted

## 2015-05-27 ENCOUNTER — Ambulatory Visit (HOSPITAL_BASED_OUTPATIENT_CLINIC_OR_DEPARTMENT_OTHER): Payer: Medicare Other

## 2015-05-27 VITALS — BP 119/62 | HR 72 | Temp 97.4°F | Resp 18

## 2015-05-27 DIAGNOSIS — R110 Nausea: Secondary | ICD-10-CM | POA: Diagnosis not present

## 2015-05-27 DIAGNOSIS — K1231 Oral mucositis (ulcerative) due to antineoplastic therapy: Secondary | ICD-10-CM | POA: Diagnosis not present

## 2015-05-27 DIAGNOSIS — C01 Malignant neoplasm of base of tongue: Secondary | ICD-10-CM | POA: Diagnosis not present

## 2015-05-27 MED ORDER — HEPARIN SOD (PORK) LOCK FLUSH 100 UNIT/ML IV SOLN
500.0000 [IU] | Freq: Once | INTRAVENOUS | Status: AC
  Administered 2015-05-27: 500 [IU] via INTRAVENOUS
  Filled 2015-05-27: qty 5

## 2015-05-27 MED ORDER — SODIUM CHLORIDE 0.9 % IJ SOLN
10.0000 mL | INTRAMUSCULAR | Status: DC | PRN
  Administered 2015-05-27: 10 mL via INTRAVENOUS
  Filled 2015-05-27: qty 10

## 2015-05-27 MED ORDER — SODIUM CHLORIDE 0.9 % IV SOLN
Freq: Once | INTRAVENOUS | Status: AC
  Administered 2015-05-27: 14:00:00 via INTRAVENOUS

## 2015-05-27 NOTE — Patient Instructions (Signed)
Dehydration, Adult Dehydration is when you lose more fluids from the body than you take in. Vital organs like the kidneys, brain, and heart cannot function without a proper amount of fluids and salt. Any loss of fluids from the body can cause dehydration.  CAUSES   Vomiting.  Diarrhea.  Excessive sweating.  Excessive urine output.  Fever. SYMPTOMS  Mild dehydration  Thirst.  Dry lips.  Slightly dry mouth. Moderate dehydration  Very dry mouth.  Sunken eyes.  Skin does not bounce back quickly when lightly pinched and released.  Dark urine and decreased urine production.  Decreased tear production.  Headache. Severe dehydration  Very dry mouth.  Extreme thirst.  Rapid, weak pulse (more than 100 beats per minute at rest).  Cold hands and feet.  Not able to sweat in spite of heat and temperature.  Rapid breathing.  Blue lips.  Confusion and lethargy.  Difficulty being awakened.  Minimal urine production.  No tears. DIAGNOSIS  Your caregiver will diagnose dehydration based on your symptoms and your exam. Blood and urine tests will help confirm the diagnosis. The diagnostic evaluation should also identify the cause of dehydration. TREATMENT  Treatment of mild or moderate dehydration can often be done at home by increasing the amount of fluids that you drink. It is best to drink small amounts of fluid more often. Drinking too much at one time can make vomiting worse. Refer to the home care instructions below. Severe dehydration needs to be treated at the hospital where you will probably be given intravenous (IV) fluids that contain water and electrolytes. HOME CARE INSTRUCTIONS   Ask your caregiver about specific rehydration instructions.  Drink enough fluids to keep your urine clear or pale yellow.  Drink small amounts frequently if you have nausea and vomiting.  Eat as you normally do.  Avoid:  Foods or drinks high in sugar.  Carbonated  drinks.  Juice.  Extremely hot or cold fluids.  Drinks with caffeine.  Fatty, greasy foods.  Alcohol.  Tobacco.  Overeating.  Gelatin desserts.  Wash your hands well to avoid spreading bacteria and viruses.  Only take over-the-counter or prescription medicines for pain, discomfort, or fever as directed by your caregiver.  Ask your caregiver if you should continue all prescribed and over-the-counter medicines.  Keep all follow-up appointments with your caregiver. SEEK MEDICAL CARE IF:  You have abdominal pain and it increases or stays in one area (localizes).  You have a rash, stiff neck, or severe headache.  You are irritable, sleepy, or difficult to awaken.  You are weak, dizzy, or extremely thirsty. SEEK IMMEDIATE MEDICAL CARE IF:   You are unable to keep fluids down or you get worse despite treatment.  You have frequent episodes of vomiting or diarrhea.  You have blood or green matter (bile) in your vomit.  You have blood in your stool or your stool looks black and tarry.  You have not urinated in 6 to 8 hours, or you have only urinated a small amount of very dark urine.  You have a fever.  You faint. MAKE SURE YOU:   Understand these instructions.  Will watch your condition.  Will get help right away if you are not doing well or get worse. Document Released: 09/27/2005 Document Revised: 12/20/2011 Document Reviewed: 05/17/2011 ExitCare Patient Information 2015 [employer]. This information is not intended to replace advice given to you by your health care provider. Make sure you discuss any questions you have with your health care   provider.  

## 2015-05-28 NOTE — Progress Notes (Signed)
  Oncology Nurse Navigator Documentation   Navigator Encounter Type: Telephone (05/27/15 1505)           Specialty Items/DME: PEG care supplies (05/27/15 1505)    Patient called from Infusion where he was receiving IVF.   At his request, I provided him Medipore tape and 2 bottles of saline for PEG care. He indicated he had received mailing re upcoming [facility], stated he and his wife plan to attend.  I spoke of the benefits of the classes, encouraged them to do so.   Gayleen Orem, RN, BSN, [place] at [place] [phone]

## 2015-05-30 ENCOUNTER — Encounter: Payer: Self-pay | Admitting: *Deleted

## 2015-05-30 ENCOUNTER — Ambulatory Visit: Payer: Medicare Other | Admitting: Nutrition

## 2015-05-30 ENCOUNTER — Ambulatory Visit (HOSPITAL_BASED_OUTPATIENT_CLINIC_OR_DEPARTMENT_OTHER): Payer: Medicare Other

## 2015-05-30 VITALS — BP 119/73 | HR 76 | Temp 98.3°F | Resp 20

## 2015-05-30 DIAGNOSIS — C01 Malignant neoplasm of base of tongue: Secondary | ICD-10-CM

## 2015-05-30 DIAGNOSIS — K1231 Oral mucositis (ulcerative) due to antineoplastic therapy: Secondary | ICD-10-CM | POA: Diagnosis not present

## 2015-05-30 DIAGNOSIS — R110 Nausea: Secondary | ICD-10-CM

## 2015-05-30 MED ORDER — SODIUM CHLORIDE 0.9 % IJ SOLN
10.0000 mL | INTRAMUSCULAR | Status: DC | PRN
  Administered 2015-05-30: 10 mL
  Filled 2015-05-30: qty 10

## 2015-05-30 MED ORDER — SODIUM CHLORIDE 0.9 % IV SOLN
Freq: Once | INTRAVENOUS | Status: AC
  Administered 2015-05-30: 14:00:00 via INTRAVENOUS

## 2015-05-30 MED ORDER — HEPARIN SOD (PORK) LOCK FLUSH 100 UNIT/ML IV SOLN
500.0000 [IU] | Freq: Once | INTRAVENOUS | Status: AC | PRN
  Administered 2015-05-30: 500 [IU]
  Filled 2015-05-30: qty 5

## 2015-05-30 NOTE — Progress Notes (Signed)
Follow-up completed with patient while he was receiving IV fluids  Patient's weight is stable at 157.1 pounds August 9  He has reduced tube feeding to one can of Osmolite 1.5 via PEG, a day  He is drinking protein shakes and eating foods and has achieved weight maintenance  Patient reports he would like to have feeding tube removed after his scan.  Provided support and encouragement for patient to continue increased oral intake and decrease tube feedings  Patient knows to contact me with questions or concerns.

## 2015-05-30 NOTE — Patient Instructions (Signed)
Dehydration, Adult Dehydration is when you lose more fluids from the body than you take in. Vital organs like the kidneys, brain, and heart cannot function without a proper amount of fluids and salt. Any loss of fluids from the body can cause dehydration.  CAUSES   Vomiting.  Diarrhea.  Excessive sweating.  Excessive urine output.  Fever. SYMPTOMS  Mild dehydration  Thirst.  Dry lips.  Slightly dry mouth. Moderate dehydration  Very dry mouth.  Sunken eyes.  Skin does not bounce back quickly when lightly pinched and released.  Dark urine and decreased urine production.  Decreased tear production.  Headache. Severe dehydration  Very dry mouth.  Extreme thirst.  Rapid, weak pulse (more than 100 beats per minute at rest).  Cold hands and feet.  Not able to sweat in spite of heat and temperature.  Rapid breathing.  Blue lips.  Confusion and lethargy.  Difficulty being awakened.  Minimal urine production.  No tears. DIAGNOSIS  Your caregiver will diagnose dehydration based on your symptoms and your exam. Blood and urine tests will help confirm the diagnosis. The diagnostic evaluation should also identify the cause of dehydration. TREATMENT  Treatment of mild or moderate dehydration can often be done at home by increasing the amount of fluids that you drink. It is best to drink small amounts of fluid more often. Drinking too much at one time can make vomiting worse. Refer to the home care instructions below. Severe dehydration needs to be treated at the hospital where you will probably be given intravenous (IV) fluids that contain water and electrolytes. HOME CARE INSTRUCTIONS   Ask your caregiver about specific rehydration instructions.  Drink enough fluids to keep your urine clear or pale yellow.  Drink small amounts frequently if you have nausea and vomiting.  Eat as you normally do.  Avoid:  Foods or drinks high in sugar.  Carbonated  drinks.  Juice.  Extremely hot or cold fluids.  Drinks with caffeine.  Fatty, greasy foods.  Alcohol.  Tobacco.  Overeating.  Gelatin desserts.  Wash your hands well to avoid spreading bacteria and viruses.  Only take over-the-counter or prescription medicines for pain, discomfort, or fever as directed by your caregiver.  Ask your caregiver if you should continue all prescribed and over-the-counter medicines.  Keep all follow-up appointments with your caregiver. SEEK MEDICAL CARE IF:  You have abdominal pain and it increases or stays in one area (localizes).  You have a rash, stiff neck, or severe headache.  You are irritable, sleepy, or difficult to awaken.  You are weak, dizzy, or extremely thirsty. SEEK IMMEDIATE MEDICAL CARE IF:   You are unable to keep fluids down or you get worse despite treatment.  You have frequent episodes of vomiting or diarrhea.  You have blood or green matter (bile) in your vomit.  You have blood in your stool or your stool looks black and tarry.  You have not urinated in 6 to 8 hours, or you have only urinated a small amount of very dark urine.  You have a fever.  You faint. MAKE SURE YOU:   Understand these instructions.  Will watch your condition.  Will get help right away if you are not doing well or get worse. Document Released: 09/27/2005 Document Revised: 12/20/2011 Document Reviewed: 05/17/2011 ExitCare Patient Information 2015 [employer]. This information is not intended to replace advice given to you by your health care provider. Make sure you discuss any questions you have with your health care   provider.  

## 2015-05-30 NOTE — Progress Notes (Signed)
  Oncology Nurse Navigator Documentation   Navigator Encounter Type: Treatment (05/30/15 1330)     Met with patient in Infusion where he was receiving IVF.  He reported that he is eating and drinking exclusively by mouth with exception that he instills 1 can Osmolite at HS before going to bed.  He stated he is maintaining his weight, energy level is approaching baseline.  We discussed his goal of weight maintenance and 100% oral intake for PEG removal.. He verbalized understanding. He understands he can contact me with needs/concerns.  Gayleen Orem, RN, BSN, [place] at [place] [phone]

## 2015-06-04 ENCOUNTER — Telehealth: Payer: Self-pay | Admitting: *Deleted

## 2015-06-04 NOTE — Telephone Encounter (Signed)
  Oncology Nurse Navigator Documentation   Navigator Encounter Type: Telephone (06/04/15 2585)     In follow-up to patient's VM yesterday in which he stated he had not been contacted by [facility] for PT lymphedema follow-up, I called [facility], requested appt be arranged.  Scheduler verbalized understanding.  Gayleen Orem, RN, BSN, [place] at [place] [phone]

## 2015-06-09 ENCOUNTER — Telehealth: Payer: Self-pay | Admitting: Hematology and Oncology

## 2015-06-09 ENCOUNTER — Other Ambulatory Visit: Payer: Self-pay | Admitting: *Deleted

## 2015-06-09 ENCOUNTER — Telehealth: Payer: Self-pay | Admitting: *Deleted

## 2015-06-09 ENCOUNTER — Ambulatory Visit (HOSPITAL_BASED_OUTPATIENT_CLINIC_OR_DEPARTMENT_OTHER): Payer: Medicare Other | Admitting: Hematology and Oncology

## 2015-06-09 ENCOUNTER — Other Ambulatory Visit (HOSPITAL_BASED_OUTPATIENT_CLINIC_OR_DEPARTMENT_OTHER): Payer: Medicare Other

## 2015-06-09 ENCOUNTER — Ambulatory Visit (HOSPITAL_BASED_OUTPATIENT_CLINIC_OR_DEPARTMENT_OTHER): Payer: Medicare Other

## 2015-06-09 ENCOUNTER — Other Ambulatory Visit: Payer: Self-pay | Admitting: Hematology and Oncology

## 2015-06-09 ENCOUNTER — Encounter: Payer: Self-pay | Admitting: Hematology and Oncology

## 2015-06-09 VITALS — BP 131/73 | HR 78 | Temp 98.2°F

## 2015-06-09 VITALS — BP 121/60 | HR 59 | Temp 98.2°F

## 2015-06-09 DIAGNOSIS — C01 Malignant neoplasm of base of tongue: Secondary | ICD-10-CM

## 2015-06-09 DIAGNOSIS — K9422 Gastrostomy infection: Secondary | ICD-10-CM

## 2015-06-09 DIAGNOSIS — E860 Dehydration: Secondary | ICD-10-CM | POA: Diagnosis not present

## 2015-06-09 DIAGNOSIS — R110 Nausea: Secondary | ICD-10-CM

## 2015-06-09 DIAGNOSIS — L089 Local infection of the skin and subcutaneous tissue, unspecified: Secondary | ICD-10-CM

## 2015-06-09 DIAGNOSIS — K1231 Oral mucositis (ulcerative) due to antineoplastic therapy: Secondary | ICD-10-CM

## 2015-06-09 LAB — CBC WITH DIFFERENTIAL/PLATELET
BASO%: 0.8 % (ref 0.0–2.0)
BASOS ABS: 0 10*3/uL (ref 0.0–0.1)
EOS%: 1.5 % (ref 0.0–7.0)
Eosinophils Absolute: 0.1 10*3/uL (ref 0.0–0.5)
HEMATOCRIT: 41.2 % (ref 38.4–49.9)
HGB: 13.6 g/dL (ref 13.0–17.1)
LYMPH#: 0.7 10*3/uL — AB (ref 0.9–3.3)
LYMPH%: 12.4 % — AB (ref 14.0–49.0)
MCH: 30.7 pg (ref 27.2–33.4)
MCHC: 33 g/dL (ref 32.0–36.0)
MCV: 93.1 fL (ref 79.3–98.0)
MONO#: 0.6 10*3/uL (ref 0.1–0.9)
MONO%: 10.2 % (ref 0.0–14.0)
NEUT#: 4.1 10*3/uL (ref 1.5–6.5)
NEUT%: 75.1 % — AB (ref 39.0–75.0)
PLATELETS: 200 10*3/uL (ref 140–400)
RBC: 4.43 10*6/uL (ref 4.20–5.82)
RDW: 13.8 % (ref 11.0–14.6)
WBC: 5.5 10*3/uL (ref 4.0–10.3)

## 2015-06-09 LAB — COMPREHENSIVE METABOLIC PANEL (CC13)
ALBUMIN: 3.7 g/dL (ref 3.5–5.0)
ALK PHOS: 76 U/L (ref 40–150)
ALT: 26 U/L (ref 0–55)
AST: 19 U/L (ref 5–34)
Anion Gap: 8 mEq/L (ref 3–11)
BILIRUBIN TOTAL: 0.22 mg/dL (ref 0.20–1.20)
BUN: 20.9 mg/dL (ref 7.0–26.0)
CO2: 24 meq/L (ref 22–29)
Calcium: 9.3 mg/dL (ref 8.4–10.4)
Chloride: 106 mEq/L (ref 98–109)
Creatinine: 1 mg/dL (ref 0.7–1.3)
EGFR: 77 mL/min/{1.73_m2} — ABNORMAL LOW (ref >90)
Glucose: 113 mg/dl (ref 70–140)
Potassium: 4 mEq/L (ref 3.5–5.1)
SODIUM: 139 meq/L (ref 136–145)
Total Protein: 6.5 g/dL (ref 6.4–8.3)

## 2015-06-09 LAB — MAGNESIUM (CC13): Magnesium: 2.3 mg/dl (ref 1.5–2.5)

## 2015-06-09 LAB — TSH CHCC: TSH: 1.273 m[IU]/L (ref 0.320–4.118)

## 2015-06-09 MED ORDER — SODIUM CHLORIDE 0.9 % IJ SOLN
10.0000 mL | INTRAMUSCULAR | Status: DC | PRN
  Administered 2015-06-09: 10 mL
  Filled 2015-06-09: qty 10

## 2015-06-09 MED ORDER — SODIUM CHLORIDE 0.9 % IV SOLN
1000.0000 mL | Freq: Once | INTRAVENOUS | Status: AC
  Administered 2015-06-09: 1000 mL via INTRAVENOUS

## 2015-06-09 MED ORDER — CEPHALEXIN 500 MG PO CAPS: 500.0000 mg | ORAL_CAPSULE | Freq: Two times a day (BID) | ORAL | Status: DC

## 2015-06-09 MED ORDER — HEPARIN SOD (PORK) LOCK FLUSH 100 UNIT/ML IV SOLN
500.0000 [IU] | Freq: Once | INTRAVENOUS | Status: AC | PRN
  Administered 2015-06-09: 500 [IU]
  Filled 2015-06-09: qty 5

## 2015-06-09 NOTE — Assessment & Plan Note (Signed)
He has mild skin infection around his feeding tube site. He is still cleaning it with mild soap and water. I recommend prescription Keflex for 10 days and agreed to proceed. He will return on Friday. If the skin infection is not improved by then, I will reassess again.

## 2015-06-09 NOTE — Progress Notes (Signed)
[facility] OFFICE PROGRESS NOTE  Patient Care Team: Laurey Morale, MD as PCP - General (Family Medicine) Heath Lark, MD as Consulting Physician (Hematology and Oncology) Melissa Montane, MD as Consulting Physician (Otolaryngology)  SUMMARY OF ONCOLOGIC HISTORY: Oncology History   Cancer of base of tongue   Staging form: Lip and Oral Cavity, AJCC 7th Edition     Clinical stage from 12/03/2014: Stage IVA (T2, N2a, M0) - Signed by Heath Lark, MD on 12/13/2014 HPV negative           Cancer of base of tongue   11/20/2014 Pathology Results Accession: [id number] confirm squamous cell carcinoma.   11/20/2014 Procedure The patient underwent fine-needle aspirate of the right lymph node   12/02/2014 Imaging CT scan showed 2.2 cm the right tongue base mass with necrotic 4 cm right level II nodal metastasis with multiple bilateral small lymphadenopathy   12/10/2014 Imaging PET CT scan showed tongue base mass with right level II lymph node metastasis   12/24/2014 Procedure He has placement of port and feeding tube   01/01/2015 - 02/12/2015 Chemotherapy He received high dose cisplatin   01/01/2015 - 02/18/2015 Radiation Therapy Rec'd concurrent RT to base of tongue and bilateral neck:  70 Gy in 35 fractions to gross disease, 63 Gy in 35 fractions to high risk nodal echelons, 56 Gy in 35 fractions to intermediate risk nodal echelons.   01/21/2015 Adverse Reaction  cycle 2 chemotherapy dose will be reduced by 25% due to intolerable side effects   02/11/2015 Adverse Reaction Cycle 3 of chemotherapy dose will be reduced to 50% due to intolerable side effects    INTERVAL HISTORY: Please see below for problem oriented charting. He is seen urgently today because of concern for infection around the feeding tube site and recent dehydration. He has not used his feeding tube for almost 2 weeks and has been eating and drinking everything exclusively by mouth. He has been very active and he felt that he got  dehydrated due to excessive sun exposure over the weekend. He denies fevers or chills. He feels thirsty and requests IV fluids today Denies recent nausea or vomiting  REVIEW OF SYSTEMS:   Constitutional: Denies fevers, chills or abnormal weight loss Eyes: Denies blurriness of vision Ears, nose, mouth, throat, and face: Denies mucositis or sore throat Respiratory: Denies cough, dyspnea or wheezes Cardiovascular: Denies palpitation, chest discomfort or lower extremity swelling Gastrointestinal:  Denies nausea, heartburn or change in bowel habits Lymphatics: Denies new lymphadenopathy or easy bruising Neurological:Denies numbness, tingling or new weaknesses Behavioral/Psych: Mood is stable, no new changes  All other systems were reviewed with the patient and are negative.  I have reviewed the past medical history, past surgical history, social history and family history with the patient and they are unchanged from previous note.  ALLERGIES:  has No Known Allergies.  MEDICATIONS:  Current Outpatient Prescriptions  Medication Sig Dispense Refill  . carvedilol (COREG) 25 MG tablet Take 1 tablet (25 mg total) by mouth 2 (two) times daily with a meal. 180 tablet 3  . cephALEXin (KEFLEX) 500 MG capsule Take 1 capsule (500 mg total) by mouth 2 (two) times daily. 20 capsule 0  . LORazepam (ATIVAN) 0.5 MG tablet TAKE 1 TABLET BY MOUTH UP TO 3 TIMES A DAY AS NEEDED FOR ANXIETY, NAUSEA, OR CLAUSTROPHOBIA. TAKE 20 MINUTES BEFORE WEARING MASK. 60 tablet 0  . mirtazapine (REMERON) 15 MG tablet Take 1 tablet (15 mg total) by mouth at bedtime. [place]  tablet 3  . morphine (ROXANOL) 20 MG/ML concentrated solution Take 1 mL (20 mg total) by mouth every 2 (two) hours as needed for severe pain. 240 mL 0  . Nutritional Supplements (FEEDING SUPPLEMENT, OSMOLITE 1.5 CAL,) LIQD Give 1.5 cans of Osmolite 1.5 via PEG QID with 60 cc free water before and after bolus feedings. Drink or flush tube with an additional 240 cc  water TID between feedings. 1422 mL 0  . sodium fluoride (FLUORISHIELD) 1.1 % GEL dental gel Instill one drop of gel per tooth space of fluoride tray. Place over teeth for 5 minutes. Remove. Spit out excess. Repeat nightly. 120 mL prn  . tadalafil (CIALIS) 20 MG tablet Take 1 tablet (20 mg total) by mouth daily as needed for erectile dysfunction. 30 tablet 3  . tamsulosin (FLOMAX) 0.4 MG CAPS capsule Take 1 capsule (0.4 mg total) by mouth daily. 30 capsule 5  . TRAVATAN Z 0.004 % SOLN ophthalmic solution Place 1 drop into both eyes at bedtime.      No current facility-administered medications for this visit.   Facility-Administered Medications Ordered in Other Visits  Medication Dose Route Frequency Provider Last Rate Last Dose  . heparin lock flush 100 unit/mL  500 Units Intracatheter Once PRN Heath Lark, MD      . sodium chloride 0.9 % injection 10 mL  10 mL Intracatheter PRN Heath Lark, MD   10 mL at 04/07/15 1627  . sodium chloride 0.9 % injection 10 mL  10 mL Intracatheter PRN Heath Lark, MD        PHYSICAL EXAMINATION: ECOG PERFORMANCE STATUS: 0 - Asymptomatic  Filed Vitals:   06/09/15 1443  BP: 131/73  Pulse: 78  Temp: 98.2 F (36.8 C)   There were no vitals filed for this visit.  GENERAL:alert, no distress and comfortable SKIN: skin color, texture, turgor are normal, no rashes or significant lesions EYES: normal, Conjunctiva are pink and non-injected, sclera clear ABDOMEN:abdomen soft, non-tender and normal bowel sounds. Noted mild skin infection around the feeding tube site Musculoskeletal:no cyanosis of digits and no clubbing  NEURO: alert & oriented x 3 with fluent speech, no focal motor/sensory deficits  LABORATORY DATA:  I have reviewed the data as listed    Component Value Date/Time   NA 140 04/16/2015 1302   NA 139 04/14/2015 1042   K 4.2 04/16/2015 1302   K 3.8 04/14/2015 1042   CL 100* 04/14/2015 1042   CO2 27 04/16/2015 1302   CO2 26 04/14/2015 1042    GLUCOSE 134 04/16/2015 1302   GLUCOSE 172* 04/14/2015 1042   BUN 29.1* 04/16/2015 1302   BUN 23* 04/14/2015 1042   CREATININE 1.2 04/16/2015 1302   CREATININE 1.49* 04/14/2015 1042   CALCIUM 9.6 04/16/2015 1302   CALCIUM 10.8* 04/14/2015 1042   PROT 6.6 04/16/2015 1302   PROT 8.9* 04/14/2015 1042   ALBUMIN 3.7 04/16/2015 1302   ALBUMIN 4.9 04/14/2015 1042   AST 31 04/16/2015 1302   AST 24 04/14/2015 1042   ALT 28 04/16/2015 1302   ALT 19 04/14/2015 1042   ALKPHOS 65 04/16/2015 1302   ALKPHOS 92 04/14/2015 1042   BILITOT 0.46 04/16/2015 1302   BILITOT 0.8 04/14/2015 1042   GFRNONAA 45* 04/14/2015 1042   GFRAA 53* 04/14/2015 1042    No results found for: SPEP, UPEP  Lab Results  Component Value Date   WBC 8.8 04/14/2015   NEUTROABS 7.4 04/14/2015   HGB 16.2 04/14/2015   HCT 47.2 04/14/2015  MCV 91.8 04/14/2015   PLT 257 04/14/2015      Chemistry      Component Value Date/Time   NA 140 04/16/2015 1302   NA 139 04/14/2015 1042   K 4.2 04/16/2015 1302   K 3.8 04/14/2015 1042   CL 100* 04/14/2015 1042   CO2 27 04/16/2015 1302   CO2 26 04/14/2015 1042   BUN 29.1* 04/16/2015 1302   BUN 23* 04/14/2015 1042   CREATININE 1.2 04/16/2015 1302   CREATININE 1.49* 04/14/2015 1042      Component Value Date/Time   CALCIUM 9.6 04/16/2015 1302   CALCIUM 10.8* 04/14/2015 1042   ALKPHOS 65 04/16/2015 1302   ALKPHOS 92 04/14/2015 1042   AST 31 04/16/2015 1302   AST 24 04/14/2015 1042   ALT 28 04/16/2015 1302   ALT 19 04/14/2015 1042   BILITOT 0.46 04/16/2015 1302   BILITOT 0.8 04/14/2015 1042       ASSESSMENT & PLAN:  Cancer of base of tongue He is doing very well except he got dehydrated over the weekend and requested IV fluids for dehydration. He is attempting to switch oral liquids to Gatorade which he tolerated better. He requested IV fluids again on Friday before the long weekend and agreed with the plan of care. He has PET CT scan scheduled next month. If the  scans showed no evidence of disease, we will get both the port and the feeding tube removed then. He agreed with the plan of care  Dehydration He got dehydrated due to excessive sun exposure over the weekend and reduced oral intake. He is attempting to drink more liquids and switch from water to Gatorade As above, we will give him fluids today and Friday  Skin infection at gastrostomy tube site He has mild skin infection around his feeding tube site. He is still cleaning it with mild soap and water. I recommend prescription Keflex for 10 days and agreed to proceed. He will return on Friday. If the skin infection is not improved by then, I will reassess again.   No orders of the defined types were placed in this encounter.   All questions were answered. The patient knows to call the clinic with any problems, questions or concerns. No barriers to learning was detected. I spent 15 minutes counseling the patient face to face. The total time spent in the appointment was 20 minutes and more than 50% was on counseling and review of test results     [facility], [place], MD 06/09/2015 2:46 PM

## 2015-06-09 NOTE — Patient Instructions (Signed)

## 2015-06-09 NOTE — Telephone Encounter (Signed)
Opened in error

## 2015-06-09 NOTE — Telephone Encounter (Signed)
I placed POF for labs, see me and infusion room for IVF, not necessarily in that order Please follow with scheduling If he is worked in sooner for IVF I will see him at the treatment room

## 2015-06-09 NOTE — Telephone Encounter (Signed)
  Oncology Nurse Navigator Documentation   Navigator Encounter Type: Telephone (06/09/15 1340)         Interventions: Coordination of Care (06/09/15 1340)     LVM for patient notifying of 2:00 labs, 2:30 appt with Dr. Alvy Bimler.  Gayleen Orem, RN, BSN, [place] at [place] [phone]           Time Spent with Patient: 15 (06/09/15 1340)

## 2015-06-09 NOTE — Assessment & Plan Note (Signed)
He got dehydrated due to excessive sun exposure over the weekend and reduced oral intake. He is attempting to drink more liquids and switch from water to Gatorade As above, we will give him fluids today and Friday

## 2015-06-09 NOTE — Telephone Encounter (Signed)
  Oncology Nurse Navigator Documentation   Navigator Encounter Type: Telephone (06/09/15 5790)         Interventions: Coordination of Care (06/09/15 0914)     Patient call to inform:  Feels like he needs IVF r/t to excessive heat over weekend.  Expressed concern re PEG insertion site.    Stated there is redness at insertion site, malodorous discharge, it is painful.  He denies a fever. I told him I would inform Dr. Alvy Bimler, that someone will follow-up with him.  He verbalized understanding.  Gayleen Orem, RN, BSN, [place] at [place] [phone]           Time Spent with Patient: 15 (06/09/15 0914)

## 2015-06-09 NOTE — Telephone Encounter (Signed)
lvm for pt regarding to Sept appt.... °

## 2015-06-09 NOTE — Assessment & Plan Note (Signed)
He is doing very well except he got dehydrated over the weekend and requested IV fluids for dehydration. He is attempting to switch oral liquids to Gatorade which he tolerated better. He requested IV fluids again on Friday before the long weekend and agreed with the plan of care. He has PET CT scan scheduled next month. If the scans showed no evidence of disease, we will get both the port and the feeding tube removed then. He agreed with the plan of care

## 2015-06-09 NOTE — Telephone Encounter (Signed)
s.w. pt and advised on todays appt...ok and aware...stacy will get ivf added

## 2015-06-11 ENCOUNTER — Other Ambulatory Visit: Payer: Self-pay | Admitting: Hematology and Oncology

## 2015-06-11 ENCOUNTER — Encounter: Payer: Self-pay | Admitting: Hematology and Oncology

## 2015-06-11 ENCOUNTER — Telehealth: Payer: Self-pay | Admitting: *Deleted

## 2015-06-11 DIAGNOSIS — G4700 Insomnia, unspecified: Secondary | ICD-10-CM

## 2015-06-11 HISTORY — DX: Insomnia, unspecified: G47.00

## 2015-06-11 MED ORDER — TRAZODONE HCL 50 MG PO TABS: 50.0000 mg | ORAL_TABLET | Freq: Every day | ORAL | Status: DC

## 2015-06-11 NOTE — Telephone Encounter (Signed)
  Oncology Nurse Navigator Documentation    Navigator Encounter Type: Telephone (06/11/15 1650)         Interventions: Coordination of Care (06/11/15 1650)     In follow-up to patient's morning VM request for sleep aid, called him to inform Dr. Alvy Bimler has e-scribed trazodone Rx to [place].  He verbalized understanding.   Gayleen Orem, RN, BSN, [place] at [place] [phone]           Time Spent with Patient: 15 (06/11/15 1650)

## 2015-06-13 ENCOUNTER — Ambulatory Visit (HOSPITAL_BASED_OUTPATIENT_CLINIC_OR_DEPARTMENT_OTHER): Payer: Medicare Other

## 2015-06-13 ENCOUNTER — Encounter: Payer: Self-pay | Admitting: *Deleted

## 2015-06-13 VITALS — BP 120/67 | HR 67 | Temp 98.5°F

## 2015-06-13 DIAGNOSIS — R110 Nausea: Secondary | ICD-10-CM

## 2015-06-13 DIAGNOSIS — C01 Malignant neoplasm of base of tongue: Secondary | ICD-10-CM

## 2015-06-13 DIAGNOSIS — E860 Dehydration: Secondary | ICD-10-CM | POA: Diagnosis not present

## 2015-06-13 DIAGNOSIS — K1231 Oral mucositis (ulcerative) due to antineoplastic therapy: Secondary | ICD-10-CM

## 2015-06-13 DIAGNOSIS — Z23 Encounter for immunization: Secondary | ICD-10-CM | POA: Diagnosis not present

## 2015-06-13 MED ORDER — HEPARIN SOD (PORK) LOCK FLUSH 100 UNIT/ML IV SOLN
500.0000 [IU] | Freq: Once | INTRAVENOUS | Status: AC | PRN
  Administered 2015-06-13: 500 [IU]
  Filled 2015-06-13: qty 5

## 2015-06-13 MED ORDER — SODIUM CHLORIDE 0.9 % IV SOLN
Freq: Once | INTRAVENOUS | Status: AC
  Administered 2015-06-13: 11:00:00 via INTRAVENOUS

## 2015-06-13 MED ORDER — SODIUM CHLORIDE 0.9 % IJ SOLN
10.0000 mL | INTRAMUSCULAR | Status: DC | PRN
  Administered 2015-06-13: 10 mL
  Filled 2015-06-13: qty 10

## 2015-06-13 MED ORDER — INFLUENZA VAC SPLIT QUAD 0.5 ML IM SUSY
0.5000 mL | PREFILLED_SYRINGE | Freq: Once | INTRAMUSCULAR | Status: AC
  Administered 2015-06-13: 0.5 mL via INTRAMUSCULAR
  Filled 2015-06-13: qty 0.5

## 2015-06-13 NOTE — Patient Instructions (Signed)

## 2015-06-17 ENCOUNTER — Encounter (HOSPITAL_COMMUNITY)
Admission: RE | Admit: 2015-06-17 | Discharge: 2015-06-17 | Disposition: A | Discharge status: Home / Self Care | Payer: Medicare Other | Source: Ambulatory Visit | Attending: Radiation Oncology | Admitting: Radiation Oncology

## 2015-06-17 ENCOUNTER — Ambulatory Visit: Payer: Medicare Other | Attending: Radiation Oncology

## 2015-06-17 DIAGNOSIS — C01 Malignant neoplasm of base of tongue: Secondary | ICD-10-CM | POA: Diagnosis present

## 2015-06-17 LAB — GLUCOSE, CAPILLARY: GLUCOSE-CAPILLARY: 104 mg/dL — AB (ref 65–99)

## 2015-06-17 MED ORDER — FLUDEOXYGLUCOSE F - 18 (FDG) INJECTION: 7.6000 | Freq: Once | INTRAVENOUS | Status: DC | PRN

## 2015-06-17 NOTE — Progress Notes (Signed)
  Oncology Nurse Navigator Documentation   Navigator Encounter Type: Treatment (06/13/15 1210)     Saw pt in Infusion where he was receiving IVF.   He stated:  Has not used PEG for the past week, continues with daily flushes.  Is feeling well.  Ttrying to drink as much water as he can tolerate. I provided him split gauze per his request.  Gayleen Orem, RN, BSN, [place] at [place] [phone]                     Time Spent with Patient: 15 (06/13/15 1210)

## 2015-06-18 ENCOUNTER — Telehealth: Payer: Self-pay | Admitting: *Deleted

## 2015-06-18 NOTE — Telephone Encounter (Signed)
  Oncology Nurse Navigator Documentation   Navigator Encounter Type: Telephone (06/18/15 1430)       Per Dr. Calton Dach guidance, called patient to inform of favorable PET results. Also asked if ready to have PAC and PEG removed.  He stated he would like to wait until next week to decide, "this hot weather is taking it out of me", he may need either/both for additional fluids.  I informed Dr. Alvy Bimler. He expressed appreciation for the call.  Gayleen Orem, RN, BSN, [place] at [place] [phone]                   Time Spent with Patient: 15 (06/18/15 1430)

## 2015-06-18 NOTE — Progress Notes (Signed)
Pain issues, if any: Denies any odonyphagia, but has pain at his PEG tube site and is currently taking an keflex per Dr. Alvy Bimler Using a feeding tube?: Not using feeding tube, but remains in palce Weight changes, if any:  Wt Readings from Last 3 Encounters:  06/20/15 147 lb (66.679 kg)  05/20/15 157 lb 1.6 oz (71.26 kg)  04/11/15 160 lb 8 oz (72.802 kg)    Swallowing issues, if any: Difficulty swallowing heavy textured foods.  Drkinks "lots of water" when eating. Smoking or chewing tobacco? No Using fluoride trays daily? Yes Last ENT visit was on: Has appointment with ENT on the 07/11/15 Other notable issues, if any: Concerned about "infection" at Peg tube site.

## 2015-06-20 ENCOUNTER — Ambulatory Visit
Admission: RE | Admit: 2015-06-20 | Discharge: 2015-06-20 | Disposition: A | Discharge status: Home / Self Care | Payer: Medicare Other | Source: Ambulatory Visit | Attending: Radiation Oncology | Admitting: Radiation Oncology

## 2015-06-20 ENCOUNTER — Encounter: Payer: Self-pay | Admitting: Radiation Oncology

## 2015-06-20 ENCOUNTER — Other Ambulatory Visit: Payer: Self-pay | Admitting: *Deleted

## 2015-06-20 VITALS — BP 102/68 | HR 77 | Temp 98.3°F | Ht 69.0 in | Wt 147.0 lb

## 2015-06-20 DIAGNOSIS — C01 Malignant neoplasm of base of tongue: Secondary | ICD-10-CM

## 2015-06-20 DIAGNOSIS — R634 Abnormal weight loss: Secondary | ICD-10-CM

## 2015-06-20 MED ORDER — MICONAZOLE NITRATE 2 % EX CREA: TOPICAL_CREAM | CUTANEOUS | Status: DC

## 2015-06-20 NOTE — Progress Notes (Addendum)
Radiation Oncology         [phone] ________________________________  Name: Nicholas Wilkerson MRN: [id number]  Date: 06/20/2015  DOB: 27-Dec-1940  Follow-Up Visit Note  CC: Laurey Morale, MD  Melissa Montane, MD  Diagnosis and Prior Radiotherapy:       ICD-9-CM ICD-10-CM   1. Cancer of base of tongue 141.0 C01 miconazole (MICATIN) 2 % cream   T2N2aM0 Stage IVa Right Base of Tongue Squamous Cell Carcinoma    Indication for treatment:  Curative with chemotherapy        Radiation treatment dates:   01/01/2015-02/18/2015   Site/dose:   Base of tongue and bilateral neck / 70 Gy in 35 fractions to gross disease, 63 Gy in 35 fractions to high risk nodal echelons, and 56 Gy in 35 fractions to intermediate risk nodal echelons   Beams/energy:   Helical IMRT / 6 MV photons   Narrative: Nicholas Wilkerson is a 74 year old male presenting to clinic in regards to the management of his cancer of the tongue. His energy levels has improved. Yesterday, he "washed and waxed" his car. "Eating is a process, but I have no pain whatsoever," the patient stated in regards to symptoms of pain while eating. He reports the lump on the side of his neck has gone down. His major concern was what appeared to be an "infection" near his feeding tube. Dr. Alvy Bimler is aware of this concerning symptom and prescribed an anti-biotic. He strongly expressed that he wants to resolve this symptom before removing his feeding tube. In the past month he has lost ten pounds. He places water in the feeding tube during hot days, but otherwise does not use the feeding tube. The patient's wife stated that he tends to get dehydrated, which leads to dizyness. He requested that his Port-A-Cath and PEG tube are to be removed at this same time. He projected a healthy mental status and was accompanied by his wife for today's radiation oncology appointment. He and his wife vocalized interest in attending the [facility] program.  Pain issues, if any: Denies any  odonyphagia, but has pain at his PEG tube site and is currently taking an keflex per Dr. Alvy Bimler Using a feeding tube?: Not using feeding tube, but remains in palce Weight changes, if any: No Swallowing issues, if any: Difficulty swallowing heavy textured foods.  Drinks "lots of water" when eating. Smoking or chewing tobacco? No Using fluoride trays daily? Yes Last ENT visit was on: Has appointment with ENT on the 07/11/2015 Other notable issues, if any: Concerned about "infection" at Peg tube site..   ALLERGIES:  has No Known Allergies.  Meds: Current Outpatient Prescriptions  Medication Sig Dispense Refill  . carvedilol (COREG) 25 MG tablet Take 1 tablet (25 mg total) by mouth 2 (two) times daily with a meal. 180 tablet 3  . cephALEXin (KEFLEX) 500 MG capsule Take 1 capsule (500 mg total) by mouth 2 (two) times daily. 20 capsule 0  . sodium fluoride (FLUORISHIELD) 1.1 % GEL dental gel Instill one drop of gel per tooth space of fluoride tray. Place over teeth for 5 minutes. Remove. Spit out excess. Repeat nightly. 120 mL prn  . tadalafil (CIALIS) 20 MG tablet Take 1 tablet (20 mg total) by mouth daily as needed for erectile dysfunction. 30 tablet 3  . tamsulosin (FLOMAX) 0.4 MG CAPS capsule Take 1 capsule (0.4 mg total) by mouth daily. 30 capsule 5  . TRAVATAN Z 0.004 % SOLN ophthalmic solution Place 1  drop into both eyes at bedtime.     . traZODone (DESYREL) 50 MG tablet Take 1 tablet (50 mg total) by mouth at bedtime. 30 tablet 3  . miconazole (MICATIN) 2 % cream Apply as directed TID for 10 days 28 g 1  . Nutritional Supplements (FEEDING SUPPLEMENT, OSMOLITE 1.5 CAL,) LIQD Give 1.5 cans of Osmolite 1.5 via PEG QID with 60 cc free water before and after bolus feedings. Drink or flush tube with an additional 240 cc water TID between feedings. (Patient not taking: Reported on 06/20/2015) 1422 mL 0   No current facility-administered medications for this encounter.   Facility-Administered  Medications Ordered in Other Encounters  Medication Dose Route Frequency Provider Last Rate Last Dose  . fludeoxyglucose F - 18 (FDG) injection 7.6 milli Curie  7.6 milli Curie Intravenous Once PRN Medication Radiologist, MD      . sodium chloride 0.9 % injection 10 mL  10 mL Intracatheter PRN Heath Lark, MD   10 mL at 04/07/15 1627    Physical Findings: The patient is in no acute distress. Patient is alert and oriented. Wt Readings from Last 3 Encounters:  06/20/15 147 lb (66.679 kg)  05/20/15 157 lb 1.6 oz (71.26 kg)  04/11/15 160 lb 8 oz (72.802 kg)    height is 5\' 9"  (1.753 m) and weight is 147 lb (66.679 kg). His temperature is 98.3 F (36.8 C). His blood pressure is 102/68 and his pulse is 77. . Erythema in oropharynx. Dry skin over neck. White coating on his tongue consistent with mucositis.  General: Alert and oriented, in no acute distress HEENT: Head is normocephalic. Extraocular movements are intact. Oropharynx is clear. Oral mucosa is dry. No lesion. Neck: Neck is supple, no palpable cervical or supraclavicular lymphadenopathy. Modest anterior neck lympodema. Skin is smooth and intact. Heart: Regular in rate and rhythm with no murmurs, rubs, or gallops. Chest: Clear to auscultation bilaterally, with no rhonchi, wheezes, or rales. Abdomen: erythema of skin around PEG site, mild. Extremities: No cyanosis or edema. Lymphatics: see Neck Exam Psychiatric: Judgment and insight are intact. Affect is appropriate.  Lab Findings: Lab Results  Component Value Date   WBC 5.5 06/09/2015   HGB 13.6 06/09/2015   HCT 41.2 06/09/2015   MCV 93.1 06/09/2015   PLT 200 06/09/2015    Lab Results  Component Value Date   TSH 1.273 06/09/2015    Radiographic Findings: Nm Pet Image Restag (ps) Skull Base To Thigh  06/17/2015   CLINICAL DATA:  Subsequent treatment strategy for cancer the tongue base.  EXAM: NUCLEAR MEDICINE PET SKULL BASE TO THIGH  TECHNIQUE: 7.6 mCi F-18 FDG was injected  intravenously. Full-ring PET imaging was performed from the skull base to thigh after the radiotracer. CT data was obtained and used for attenuation correction and anatomic localization.  FASTING BLOOD GLUCOSE:  Value: 104 mg/dl  COMPARISON:  12/10/2014  FINDINGS: NECK  Physiologic appearing glottic activity bilaterally. Prior pathologic right adenopathy is cleared. Currently no significant abnormal asymmetry or hypermetabolic activity along the tongue base.  CHEST  0.7 cm right hilar lymph node maximum standard uptake value 4.9, previously 4.6.  0.9 cm in short axis subcarinal lymph node, maximum standard uptake value 7.1, previously 6.1.  Very faint AP window focus of metabolic activity, maximum standard uptake value 3.8, background mediastinal activity the 2.9.  ABDOMEN/PELVIS  No abnormal hypermetabolic activity within the liver, pancreas, adrenal glands, or spleen. No hypermetabolic lymph nodes in the abdomen or pelvis. Expected high  activity along the percutaneous gastrostomy tube tract.  Scattered small hypodense liver lesions are not hypermetabolic. Calcified lesion along the posterior margin of the right hepatic lobe is stable and not hypermetabolic.  Enlarged prostate gland. Mild wall thickening in the urinary bladder likely from nondistention.  SKELETON  No focal hypermetabolic activity to suggest skeletal metastasis. Lower lumbar spondylosis and degenerative disc disease causing multilevel impingement.  IMPRESSION: 1. Prior abnormal activity at the tongue base and in the right station 2 lymph node chain has resolved. 2. Slight increase in metabolic activity of the small subcarinal lymph node and very small right hilar lymph node. From a size standpoint these are not impressive, and there are some adjacent calcifications particularly along the subcarinal lymph node suggesting old granulomatous disease. This could represent some active granulomatous process, or less likely small metastatic foci which have  not grown. 3. There is some subcutaneous edema along the chin, without associated abnormal hypermetabolic activity. This may relate to radiation therapy. 4. Other imaging findings of potential clinical significance: Lower lumbar spondylosis and degenerative disc disease causing multilevel impingement. Peg tube noted. Hypodense liver lesions are not hypermetabolic and likely represent benign lesions such as cysts or hemangiomas. Enlarged prostate gland.   Electronically Signed   By: Van Clines M.D.   On: 06/17/2015 14:02    Impression/Plan:   1) Head and Neck Cancer Status: NED,  order CT of chest in 40mo to follow mediastinal nodes favored to be benign per tumor board  2) Nutritional Status: - weight:  lost 10 pounds in the past 30 days but stabilizing with PO intake - PEG tube: Yes, still has not been removed  3) Risk Factors: The patient has been educated about risk factors including alcohol and tobacco abuse; they understand that avoidance of alcohol and tobacco is important to prevent recurrences as well as other cancers  4) Swallowing: no major complaints  5) Dental: Encouraged to continue regular followup with dentistry, and dental hygiene including fluoride rinses.  6) Thyroid function: good Lab Results  Component Value Date   TSH 1.273 06/09/2015     7) Social: He is supported by his wife. FYNN course next week.  8) Other:   He was instructed to seek an appointment with a physical therapist in regards to managing the modest anterior neck lymphoedema noted during today's physical exam, to prevent this symptom from worsening in the future. He understands to request a compression collar. The patient was also instructed to continue the antibiotic Dr. Alvy Bimler prescribed to him around ten days ago, until administration of medication is complete. A Miconazole cream prescription has been provided for topical treatment to better alleviate symptoms of concern located around the patient's  PEG tube. If these symptoms are not alleviated with the prescribed antibiotic and prescribed cream, he is to notify Dr. Isidore Moos and Dr. Alvy Bimler immediatly. The importance of using his fluoride trays was reviewed. He is aware of his appointment with his ENT doctor, Dr. Janace Hoard to take place in September 30th of 2016. It was recommended that he push his appointment back three months. The patient is advised of his radiation oncology appointment to take place in March of 2017. In regards to the patient's ten pound weight loss in the past thirty days, he has been encouraged of healthy methods of weight management. If he continues to loose weight at this rate, his PEG tube will not be removed. The patient was encouraged to call with any issues or questions before then. All  vocalized questions and concerns have been addressed. If the patient develops any further questions or concerns in regards to her treatment and recovery, he has been encouraged to contact Dr. Isidore Moos.    This document serves as a record of services personally performed by Eppie Gibson, MD. It was created on her behalf by Lenn Cal, a trained medical scribe. The creation of this record is based on the scribe's personal observations and the provider's statements to them. This document has been checked and approved by the attending provider.   _____________________________________   Eppie Gibson, MD

## 2015-06-23 ENCOUNTER — Other Ambulatory Visit: Payer: Self-pay | Admitting: Radiation Oncology

## 2015-06-23 ENCOUNTER — Telehealth: Payer: Self-pay | Admitting: *Deleted

## 2015-06-23 DIAGNOSIS — C01 Malignant neoplasm of base of tongue: Secondary | ICD-10-CM

## 2015-06-23 NOTE — Telephone Encounter (Signed)
  Oncology Nurse Navigator Documentation   Navigator Encounter Type: Telephone (06/23/15 1450)         Interventions: Coordination of Care (06/23/15 1450)     Per Dr. Pearlie Oyster guidance, called [facility] to arrange follow-up visit.  Spoke with Janett Billow, requested that patient be contacted and appt be arranged to see Dr. Janace Hoard in 3 months.   She verbalized understanding.  Gayleen Orem, RN, BSN, [place] at [place] [phone]            Time Spent with Patient: 15 (06/23/15 1450)

## 2015-06-27 ENCOUNTER — Ambulatory Visit: Payer: Medicare Other | Attending: Radiation Oncology | Admitting: Physical Therapy

## 2015-06-27 DIAGNOSIS — I890 Lymphedema, not elsewhere classified: Secondary | ICD-10-CM | POA: Diagnosis not present

## 2015-06-27 DIAGNOSIS — Z9189 Other specified personal risk factors, not elsewhere classified: Secondary | ICD-10-CM | POA: Insufficient documentation

## 2015-06-27 NOTE — Therapy (Signed)
[facility] [place], [place], [address] Phone: [phone]   Fax:  [phone]  Physical Therapy Evaluation  Patient Details  Name: Nicholas Wilkerson MRN: [id number] Date of Birth: 11-01-1940 Referring Provider:  Eppie Gibson, MD  Encounter Date: 06/27/2015      PT End of Session - 06/27/15 1303    Visit Number 1   Number of Visits 9   Date for PT Re-Evaluation 08/08/15   PT Start Time 0940   PT Stop Time 1020   PT Time Calculation (min) 40 min   Activity Tolerance Patient tolerated treatment well   Behavior During Therapy [facility] for tasks assessed/performed      Past Medical History  Diagnosis Date  . Hypertension   . Dilated cardiomyopathy   . Hypercholesterolemia   . DIVERTICULOSIS, COLON 12/08/2007  . ARTHRITIS 10/22/2008  . History of echocardiogram 05/22/2007    EF was 45-50% / Mild concentric LV hypertrophy with mild global hypokinesis and overall mild systolic dysfunction .  Mild AV sclerosis / Mild Mitral insufficiency / compared to prior study 04/24/02 -- LV function has improved further.    . CHF (congestive heart failure)     sees Dr. Peter [name]   . BPH (benign prostatic hyperplasia)   . Squamous cell carcinoma 11/20/14    base of tongue primary  . Skin cancer     squamous cell, basal cell  . H/O asbestos exposure   . Glaucoma   . Radiation 01/01/15-02/18/15    base of tongue and bilateral neck 70 Gy  . Insomnia 06/11/2015    Past Surgical History  Procedure Laterality Date  . Cardiac catheterization  10/17/2001    EF estimated at 30% / moderate LV enlargement  / 1. Minimal nonobstructive atherosclerotic coronary artery disease / 2. Severe LV dysfunction with global hypokinesia consistent with dilated nonischemic cardiomyopath / 3. Moderate pulmonary hypertension  . Colonoscopy  08-15-07    per Dr. Carlean Purl, clear , repeat in 10 yrs  . Lymph node biopsy      There were no vitals filed for  this visit.  Visit Diagnosis:  Acquired lymphedema - Plan: PT plan of care cert/re-cert      Subjective Assessment - 06/27/15 0941    Subjective "I'm really doing pretty good, with my eating and stuff.  I didn't think I'd be able to swallow again."  Made extra speech appointments, and it helped.Marland Kitchen  Here about swelling at anterior neck.  Lost about 35 lbs. during treatment.   Pertinent History Right neck needle biopsy 11/20/14 showed squamous cell carcinoma; tumor at base of tongue.  Chemoradiation treatments completed 02/18/15.  Still has PEG tube in, but doesn't use it.  Has been eating but hasn't gained much weight.  Otherwise healthy, per his report.  Checks blood pressure regularly at home, but doesn't need medication right now.                                                                       Patient Stated Goals take care of neck swelling   Currently in Pain? No/denies            [facility] PT Assessment - 06/27/15 0001    Assessment  Medical Diagnosis squamous cell carcinoma at base of tongue   Precautions   Precautions Other (comment)   Restrictions   Weight Bearing Restrictions No   Balance Screen   Has the patient fallen in the past 6 months No   Has the patient had a decrease in activity level because of a fear of falling?  No   Is the patient reluctant to leave their home because of a fear of falling?  No   Home Environment   Living Environment Private residence   Living Arrangements Spouse/significant other   Type of [place] Two level   Prior Function   Level of Independence Independent   Leisure reports he remains active, doing housework, yardwork, Social research officer, government.  Gets out in the yard and swings a golf club.  Does some walking, but not everyday, partly because of hot summer weather.   Cognition   Overall Cognitive Status Within Functional Limits for tasks assessed   Functional Tests   Functional tests Sit to Stand   Sit to Stand   Comments 13 times in 30  seconds   ROM / Strength   AROM / PROM / Strength AROM   AROM   Overall AROM Comments neck AROM limited approx. 25% in extension, 15% in sidebend and rotation bilat.  Shoulder AROM WFL bilat.   Palpation   Palpation comment firm to the touch at anterior neck area of edema           LYMPHEDEMA/ONCOLOGY QUESTIONNAIRE - 06/27/15 0955    Type   Cancer Type base of tongue squamous cell   Treatment   Past Chemotherapy Treatment Yes   Date 02/18/15   Past Radiation Treatment Yes   Date 02/18/15   Head and Neck   4 cm superior to sternal notch around neck 36.5 cm  sternal notch is low in him   6 cm superior to sternal notch around neck 37 cm   8 cm superior to sternal notch around neck 38.7 cm   Other 10 cm. proximal to sternal notch, 40.5                         PT Education - 06/27/15 1302    Education provided Yes   Education Details use of foam chip pack with short stretch bandage for neck compression, 2-4 hours per day at a comfortable amount of compression   Person(s) Educated Patient   Methods Explanation;Demonstration   Comprehension Verbalized understanding                [facility] Goals - 06/27/15 1307    CC Long Term Goal  #1   Title Independent in performing self-manual lymph drainage.   Time 4   Period Weeks   Status New   CC Long Term Goal  #2   Title knowledgeable about where and how to obtain a manufactured neck compression garment   Time 4   Period Weeks   Status New   CC Long Term Goal  #3   Title neck circumference at 8 cm. proximal to sternal notch reduced to 37.5 cm. or less   Baseline 38.7 cm. on eval   Time 4   Period Weeks   Status New         [facility] Clinic Goals - 12/11/14 1421    Patient will be able to verbalize understanding of a home exercise program for cervical range of motion, posture,  and walking.    Status Achieved   Patient will be able to verbalize understanding of proper sitting and  standing posture.    Status Achieved   Patient will be able to verbalize understanding of lymphedema risk and availability of treatment for this condition.    Status Achieved           Plan - 2015-07-13 1303    Clinical Impression Statement Patient now post chemoradiation treatment for head and neck cancer and has developed visible and palpable edema in anterior neck.  He will do well with treatment and education in self-care for this.  His neck ROM is only mildly limited.   Pt will benefit from skilled therapeutic intervention in order to improve on the following deficits Increased edema;Decreased knowledge of use of DME;Decreased range of motion   Rehab Potential Good   PT Frequency 2x / week   PT Duration 4 weeks   PT Treatment/Interventions Manual lymph drainage;Patient/family education;ADLs/Self Care Home Management;Manual techniques;Compression bandaging;DME Instruction   PT Next Visit Plan Begin manual lymph drainage; show pictures and samples of manufacture neck compression garments; later, instruct in self-manual lymph drainage; encourage exercise.   PT Home Exercise Plan use chip pack to neck 2-4 hours/day and 30 minutes prior to therapy sessions   Consulted and Agree with Plan of Care Patient          G-Codes - July 13, 2015 1309    Functional Assessment Tool Used clinical judgement   Functional Limitation Self care   Self Care Current Status [phone]) At least 80 percent but less than 100 percent impaired, limited or restricted   Self Care Goal Status ([id number]) At least 1 percent but less than 20 percent impaired, limited or restricted       Problem List Patient Active Problem List   Diagnosis Date Noted  . Insomnia 06/11/2015  . Skin infection at gastrostomy tube site 06/09/2015  . Protein calorie malnutrition 03/24/2015  . Depression, acute 03/24/2015  . Dehydration 01/24/2015  . Palpitations 01/24/2015  . Leukopenia due to antineoplastic chemotherapy  01/21/2015  . Mucositis due to chemotherapy 01/14/2015  . Chemotherapy induced nausea and vomiting 01/07/2015  . Weight loss 01/07/2015  . History of skin cancer 12/03/2014  . Cancer of base of tongue 12/02/2014  . Chronic systolic CHF (congestive heart failure) 07/12/2013  . MUSCLE STRAIN, ABDOMINAL WALL 11/28/2008  . ARTHRITIS 10/22/2008  . BURSITIS, LEFT SHOULDER 07/24/2008  . INTERNAL HEMORRHOIDS 12/08/2007  . EXTERNAL HEMORRHOIDS 12/08/2007  . DIVERTICULOSIS, COLON 12/08/2007  . RECTAL BLEEDING 12/08/2007  . CONGESTIVE HEART FAILURE, HX OF 12/08/2007  . Congestive dilated cardiomyopathy 06/13/2007  . HX, PERSONAL, ARTHRITIS 06/13/2007  . Hypercholesterolemia 05/15/2007  . Essential hypertension 05/15/2007    S[place]IS[place],DONNA 2015-07-13, 1:12 PM  [place], [place], [address] Phone: [phone]   Fax:  [place], PT 13-Jul-2015 1:12 PM

## 2015-06-30 ENCOUNTER — Telehealth: Payer: Self-pay | Admitting: *Deleted

## 2015-06-30 ENCOUNTER — Other Ambulatory Visit: Payer: Self-pay | Admitting: Family Medicine

## 2015-06-30 NOTE — Telephone Encounter (Signed)
CALLED PATIENT TO INFORM OF LAB, CT AND FU, LVM FOR A RETURN CALL 

## 2015-07-01 ENCOUNTER — Ambulatory Visit: Payer: Medicare Other

## 2015-07-01 DIAGNOSIS — I890 Lymphedema, not elsewhere classified: Secondary | ICD-10-CM

## 2015-07-01 DIAGNOSIS — Z9189 Other specified personal risk factors, not elsewhere classified: Secondary | ICD-10-CM

## 2015-07-01 NOTE — Therapy (Signed)
[place], [place], [address] Phone: [phone]   Fax:  [phone]  Physical Therapy Treatment  Patient Details  Name: Nicholas Wilkerson MRN: [id number] Date of Birth: 1941-02-17 Referring Provider:  Laurey Morale, MD  Encounter Date: 07/01/2015      PT End of Session - 07/01/15 1157    Visit Number 2   Number of Visits 9   Date for PT Re-Evaluation 08/08/15   PT Start Time 1100   PT Stop Time 1148   PT Time Calculation (min) 48 min   Activity Tolerance Patient tolerated treatment well   Behavior During Therapy [facility] for tasks assessed/performed      Past Medical History  Diagnosis Date  . Hypertension   . Dilated cardiomyopathy   . Hypercholesterolemia   . DIVERTICULOSIS, COLON 12/08/2007  . ARTHRITIS 10/22/2008  . History of echocardiogram 05/22/2007    EF was 45-50% / Mild concentric LV hypertrophy with mild global hypokinesis and overall mild systolic dysfunction .  Mild AV sclerosis / Mild Mitral insufficiency / compared to prior study 04/24/02 -- LV function has improved further.    . CHF (congestive heart failure)     sees Dr. Peter [name]   . BPH (benign prostatic hyperplasia)   . Squamous cell carcinoma 11/20/14    base of tongue primary  . Skin cancer     squamous cell, basal cell  . H/O asbestos exposure   . Glaucoma   . Radiation 01/01/15-02/18/15    base of tongue and bilateral neck 70 Gy  . Insomnia 06/11/2015    Past Surgical History  Procedure Laterality Date  . Cardiac catheterization  10/17/2001    EF estimated at 30% / moderate LV enlargement  / 1. Minimal nonobstructive atherosclerotic coronary artery disease / 2. Severe LV dysfunction with global hypokinesia consistent with dilated nonischemic cardiomyopath / 3. Moderate pulmonary hypertension  . Colonoscopy  08-15-07    per Dr. Carlean Purl, clear , repeat in 10 yrs  . Lymph node biopsy      There were no vitals filed for  this visit.  Visit Diagnosis:  Acquired lymphedema  At risk for lymphedema      Subjective Assessment - 07/01/15 1104    Subjective II've worn the chip pack a few times since last visit and have been surprised at how much softer the fluid is when I take it off.   Currently in Pain? No/denies                         [facility] Adult PT Treatment/Exercise - 07/01/15 0001    Manual Therapy   Edema Management Showed pt options for head/neck compression garments and he liked the [employer] off the shelf that allowed for his chip pack to be inserted at the anterior neck. Made pt copy of the sheet with the info on how to order.   Manual Lymphatic Drainage (MLD) In Supine: Short neck, diaphragmatic breathing, short neck, supraclavicular fossa, bil shoulder collectors, bil axillae nodes and bil upper quadrants avoiding port on Rt side, anterior neck, submental and submandibular nodes, bil massters, pre- and retroauricular nodes and suboccipital nodes all directing towards bil lateral neck and instructing pt throughout in principles of manual lymph drainage and purpose of sequence.                PT Education - 07/01/15 1156    Education provided Yes   Education Details  Began instructing pt in basic principles of manual lymph drainage today and how to order off the shelf compression garment.   Person(s) Educated Patient   Methods Explanation;Demonstration;Handout  Handout for compression garment   Comprehension Verbalized understanding;Need further instruction                Long Term Clinic Goals - 07/01/15 1201    CC Long Term Goal  #1   Title Independent in performing self-manual lymph drainage.   Status On-going   CC Long Term Goal  #2   Title knowledgeable about where and how to obtain a manufactured neck compression garment  Information issued to pt today after showing him multiple options. 07/01/15   Status Partially Met   CC Long Term Goal  #3   Title neck  circumference at 8 cm. proximal to sternal notch reduced to 37.5 cm. or less   Status On-going         Head and Neck Clinic Goals - 12/11/14 1421    Patient will be able to verbalize understanding of a home exercise program for cervical range of motion, posture, and walking.    Status Achieved   Patient will be able to verbalize understanding of proper sitting and standing posture.    Status Achieved   Patient will be able to verbalize understanding of lymphedema risk and availability of treatment for this condition.    Status Achieved           Plan - 07/01/15 1157    Clinical Impression Statement Pt tolerated treatment very well today and demonstrated a good basic understanding of principles of manual lymph drainage. Pt also eager to order a head/neck compression garment after showing him options today and explained the garment isn;t meant to be the one thing that fixes his lymphedema, it will just be one of the things that will help him control his lymphedema and he verbalized understanding this.    Pt will benefit from skilled therapeutic intervention in order to improve on the following deficits Increased edema;Decreased knowledge of use of DME;Decreased range of motion   Rehab Potential Good   PT Frequency 2x / week   PT Duration 4 weeks   PT Treatment/Interventions Manual lymph drainage;Patient/family education;ADLs/Self Care Home Management;Manual techniques;Compression bandaging;DME Instruction   PT Next Visit Plan Measure circumference next visit. Cont instruction of manual lymph drainage by having pt perform next visit after therapist and also issue handout. Cont to enourage exercise.   PT Home Exercise Plan Pt to order his off the shelf head/neck compression garment.   Consulted and Agree with Plan of Care Patient        Problem List Patient Active Problem List   Diagnosis Date Noted  . Insomnia 06/11/2015  . Skin infection at gastrostomy tube site  06/09/2015  . Protein calorie malnutrition 03/24/2015  . Depression, acute 03/24/2015  . Dehydration 01/24/2015  . Palpitations 01/24/2015  . Leukopenia due to antineoplastic chemotherapy 01/21/2015  . Mucositis due to chemotherapy 01/14/2015  . Chemotherapy induced nausea and vomiting 01/07/2015  . Weight loss 01/07/2015  . History of skin cancer 12/03/2014  . Cancer of base of tongue 12/02/2014  . Chronic systolic CHF (congestive heart failure) 07/12/2013  . MUSCLE STRAIN, ABDOMINAL WALL 11/28/2008  . ARTHRITIS 10/22/2008  . BURSITIS, LEFT SHOULDER 07/24/2008  . INTERNAL HEMORRHOIDS 12/08/2007  . EXTERNAL HEMORRHOIDS 12/08/2007  . DIVERTICULOSIS, COLON 12/08/2007  . RECTAL BLEEDING 12/08/2007  . CONGESTIVE HEART FAILURE, HX OF 12/08/2007  . Congestive  dilated cardiomyopathy 06/13/2007  . HX, PERSONAL, ARTHRITIS 06/13/2007  . Hypercholesterolemia 05/15/2007  . Essential hypertension 05/15/2007    Otelia Limes, PTA 07/01/2015, 12:02 PM  [place], [place], [address] Phone: [phone]   Fax:  [phone]

## 2015-07-03 ENCOUNTER — Telehealth: Payer: Self-pay | Admitting: *Deleted

## 2015-07-03 NOTE — Telephone Encounter (Signed)
  Oncology Nurse Navigator Documentation   Navigator Encounter Type: Telephone (07/03/15 1607)         Interventions: Medication assitance (07/03/15 1607)     Patient called with refill request for trazodone. I explained original 8/31 Rx issued with 3 refills.  He verbalized understanding.  Gayleen Orem, RN, BSN, [facility] [place] [phone]           Time Spent with Patient: 15 (07/03/15 1607)

## 2015-07-08 ENCOUNTER — Ambulatory Visit: Payer: Medicare Other | Admitting: Physical Therapy

## 2015-07-08 DIAGNOSIS — I890 Lymphedema, not elsewhere classified: Secondary | ICD-10-CM

## 2015-07-08 DIAGNOSIS — Z9189 Other specified personal risk factors, not elsewhere classified: Secondary | ICD-10-CM

## 2015-07-08 NOTE — Therapy (Addendum)
[place], [place], [address] Phone: [phone]   Fax:  [phone]  Physical Therapy Treatment  Patient Details  Name: Nicholas Wilkerson MRN: [id number] Date of Birth: Mar 05, 1941 Referring Provider:  Eppie Gibson, MD  Encounter Date: 07/08/2015      PT End of Session - 07/08/15 1202    Visit Number 3   Number of Visits 9   Date for PT Re-Evaluation 08/08/15   PT Start Time 1102   PT Stop Time 1140   PT Time Calculation (min) 38 min   Activity Tolerance Patient tolerated treatment well   Behavior During Therapy [facility] for tasks assessed/performed      Past Medical History  Diagnosis Date  . Hypertension   . Dilated cardiomyopathy   . Hypercholesterolemia   . DIVERTICULOSIS, COLON 12/08/2007  . ARTHRITIS 10/22/2008  . History of echocardiogram 05/22/2007    EF was 45-50% / Mild concentric LV hypertrophy with mild global hypokinesis and overall mild systolic dysfunction .  Mild AV sclerosis / Mild Mitral insufficiency / compared to prior study 04/24/02 -- LV function has improved further.    . CHF (congestive heart failure)     sees Dr. Peter [name]   . BPH (benign prostatic hyperplasia)   . Squamous cell carcinoma 11/20/14    base of tongue primary  . Skin cancer     squamous cell, basal cell  . H/O asbestos exposure   . Glaucoma   . Radiation 01/01/15-02/18/15    base of tongue and bilateral neck 70 Gy  . Insomnia 06/11/2015    Past Surgical History  Procedure Laterality Date  . Cardiac catheterization  10/17/2001    EF estimated at 30% / moderate LV enlargement  / 1. Minimal nonobstructive atherosclerotic coronary artery disease / 2. Severe LV dysfunction with global hypokinesia consistent with dilated nonischemic cardiomyopath / 3. Moderate pulmonary hypertension  . Colonoscopy  08-15-07    per Dr. Carlean Purl, clear , repeat in 10 yrs  . Lymph node biopsy      There were no vitals filed for  this visit.  Visit Diagnosis:  Acquired lymphedema  At risk for lymphedema      Subjective Assessment - 07/08/15 1104    Subjective "The biggest probem I got is this feeding tube" pt hopes to have it removed soon. He has not ordered the neck compression garment yet, but says he is wearing the short stretch bandage and chip pack several times a day.    Pertinent History Right neck needle biopsy 11/20/14 showed squamous cell carcinoma; tumor at base of tongue.  Chemoradiation treatments completed 02/18/15.  Still has PEG tube in, but doesn't use it.  Has been eating but hasn't gained much weight.  Otherwise healthy, per his report.  Checks blood pressure regularly at home, but doesn't need medication right now.                                                                       Currently in Pain? No/denies               LYMPHEDEMA/ONCOLOGY QUESTIONNAIRE - 07/08/15 1135    Head and Neck   4 cm superior to sternal notch around  neck 37 cm   6 cm superior to sternal notch around neck 36.9 cm   8 cm superior to sternal notch around neck 37 cm   Other 10 cm. proximal to sternal notch, 38  38                  [facility] Adult PT Treatment/Exercise - 07/08/15 0001    Neck Exercises: Seated   Other Seated Exercise neck and upper thoracic range of motion .    Manual Therapy   Manual Lymphatic Drainage (MLD) In Supine: Short neck, diaphragmatic breathing, short neck, supraclavicular fossa, bil shoulder collectors, bil axillae nodes and bil upper quadrants avoiding port on Rt side, anterior neck, submental and submandibular nodes, bil massters, pre- and retroauricular nodes and suboccipital nodes all directing towards bil lateral neck and instructing pt throughout.  Reinfoced hand placement and encouraged lightening up on touch and slowing down speed                 PT Education - 07/08/15 1201    Education provided Yes   Education Details continued with self manual lymph  drainage   Person(s) Educated Patient   Methods Explanation;Demonstration;Tactile cues  hand over hand instruction on neck    Comprehension Verbalized understanding;Returned demonstration;Need further instruction                [facility] Goals - 07/08/15 1202    CC Long Term Goal  #1   Title Independent in performing self-manual lymph drainage.   Status On-going   CC Long Term Goal  #2   Title knowledgeable about where and how to obtain a manufactured neck compression garment   Status Achieved   CC Long Term Goal  #3   Title neck circumference at 8 cm. proximal to sternal notch reduced to 37.5 cm. or less   Baseline 38.7 cm. on eval , 37 cm on 07/08/2015   Status Achieved         Head and Neck Clinic Goals - 12/11/14 1421    Patient will be able to verbalize understanding of a home exercise program for cervical range of motion, posture, and walking.    Status Achieved   Patient will be able to verbalize understanding of proper sitting and standing posture.    Status Achieved   Patient will be able to verbalize understanding of lymphedema risk and availability of treatment for this condition.    Status Achieved           Plan - 07/08/15 1231    Clinical Impression Statement Goal met for circumferential reduction at 8 cm above sternal notch  and for pt knowing her can get his garment although he has not ordered it yet. He still needs continued work on self manual lymph drainage technique   Pt will benefit from skilled therapeutic intervention in order to improve on the following deficits Increased edema;Decreased knowledge of use of DME;Decreased range of motion   Clinical Impairments Affecting Rehab Potential radiation therapy    PT Next Visit Plan Review neck and upper thoracic range of motion  Cont instruction of manual lymph drainage by having pt perform next visit after therapist and also issue handout.    Consulted and Agree with Plan of  Care Patient        Problem List Patient Active Problem List   Diagnosis Date Noted  . Insomnia 06/11/2015  . Skin infection at gastrostomy tube site 06/09/2015  . Protein calorie malnutrition 03/24/2015  .  Depression, acute 03/24/2015  . Dehydration 01/24/2015  . Palpitations 01/24/2015  . Leukopenia due to antineoplastic chemotherapy 01/21/2015  . Mucositis due to chemotherapy 01/14/2015  . Chemotherapy induced nausea and vomiting 01/07/2015  . Weight loss 01/07/2015  . History of skin cancer 12/03/2014  . Cancer of base of tongue 12/02/2014  . Chronic systolic CHF (congestive heart failure) 07/12/2013  . MUSCLE STRAIN, ABDOMINAL WALL 11/28/2008  . ARTHRITIS 10/22/2008  . BURSITIS, LEFT SHOULDER 07/24/2008  . INTERNAL HEMORRHOIDS 12/08/2007  . EXTERNAL HEMORRHOIDS 12/08/2007  . DIVERTICULOSIS, COLON 12/08/2007  . RECTAL BLEEDING 12/08/2007  . CONGESTIVE HEART FAILURE, HX OF 12/08/2007  . Congestive dilated cardiomyopathy 06/13/2007  . HX, PERSONAL, ARTHRITIS 06/13/2007  . Hypercholesterolemia 05/15/2007  . Essential hypertension 05/15/2007   Donato Heinz. Owens Shark, PT  07/08/2015, 12:42 PM  [place], [place], [address] Phone: [phone]   Fax:  [phone]   PHYSICAL THERAPY DISCHARGE SUMMARY  Visits from Start of Care: 3  Current functional level related to goals / functional outcomes: As above  Remaining deficits: Lymphedema    Education / Equipment: Self manual lymph drainage, how to get a compression garment  Plan: Patient agrees to discharge.  Patient goals were partially met. Patient is being discharged due to being pleased with the current functional level.  ?????        Maudry Diego, PT 09/11/2015 1:02 PM

## 2015-07-09 ENCOUNTER — Other Ambulatory Visit: Payer: Self-pay | Admitting: Hematology and Oncology

## 2015-07-09 ENCOUNTER — Encounter: Payer: Self-pay | Admitting: Nutrition

## 2015-07-09 DIAGNOSIS — C01 Malignant neoplasm of base of tongue: Secondary | ICD-10-CM

## 2015-07-09 NOTE — Progress Notes (Signed)
Patient is ready to have his feeding tube removed and asked me to communicate with his MD. Weighed patient.  Current weight increased to 153.2 pounds increased from 147 pounds. Patient has not used his feeding tube for greater than 4 weeks. It appears patient is consuming adequate oral intake and I would be in favor of feeding tube removal.

## 2015-07-11 ENCOUNTER — Encounter: Payer: Self-pay | Admitting: Adult Health

## 2015-07-11 ENCOUNTER — Telehealth: Payer: Self-pay | Admitting: *Deleted

## 2015-07-11 NOTE — Telephone Encounter (Signed)
  Oncology Nurse Navigator Documentation   Navigator Encounter Type: Telephone (07/11/15 1615)         Interventions: Coordination of Care (07/11/15 1615)     Called patient to offer attendance at 07/16/15 [place] to meet with Mike Craze, NP, for [facility] Survivorship. He indicated interest/availability, I provided him a 12:30 appt, guidance for registration/arrival.  Gayleen Orem, RN, BSN, [place] [facility] [place] [phone]           Time Spent with Patient: 15 (07/11/15 1615)

## 2015-07-11 NOTE — Progress Notes (Signed)
Survivorship Care Plan  Provided by Mike Craze, NP on 07/16/2015     General Information  Patient name Nicholas Wilkerson   Patient ID [id number]   Date of birth 10-17-1940     Your Care Team  Patient Care Team: Laurey Morale, MD as PCP - General (Family Medicine) Heath Lark, MD as Consulting Physician (Hematology and Oncology) Melissa Montane, MD as Consulting Physician (Otolaryngology) Eppie Gibson, MD - Radiation Oncologist Teena Dunk, DDS - Dentist Gayleen Orem, RN - Nurse Navigator Garald Balding, SLP - Speech Therapist Leone Payor, PT - Physical Therapist Collie Siad, PT - Physical Therapist  Polo Riley, LCSW - Social Worker Dory Peru, [place] - Dietitian Mike Craze, NP - Survivorship Nurse Practitioner   *To reach your [facility] providers, call [phone].   Cancer Diagnosis Information  Diagnosis Base of tongue cancer  Diagnosis Date 11/20/14  Staging Cancer of base of tongue   Staging form: Lip and Oral Cavity, AJCC 7th Edition     Clinical stage from 12/03/2014: Stage IVA (T2, N2a, M0) - Signed by Heath Lark, MD on 12/13/2014      Background Information  Family history Family History  Problem Relation Age of Onset  . Stroke Father   . Cancer Father     kidney ca  . Angina Mother      Human Papilloma Virus (HPV) Positive  Tobacco use Never smoker  Alcohol use No significant alcohol use    Treatment Summary  Oncology History   Cancer of base of tongue   Staging form: Lip and Oral Cavity, AJCC 7th Edition     Clinical stage from 12/03/2014: Stage IVA (T2, N2a, M0) - Signed by Heath Lark, MD on 12/13/2014 HPV positive by PCR testing.     Cancer of base of tongue ([place])   11/20/2014 Pathology Results Accession: [id number] confirm squamous cell carcinoma.   11/20/2014 Procedure The patient underwent fine-needle aspirate of the right lymph node   12/02/2014 Imaging CT scan showed 2.2 cm the right tongue base mass with necrotic 4 cm right  level II nodal metastasis with multiple bilateral small lymphadenopathy   12/10/2014 Imaging PET CT scan showed tongue base mass with right level II lymph node metastasis   12/18/2014 Pathology Results PCR results from [facility]: HPV 16 positive.  HPV 18 and other types negative.    12/24/2014 Procedure He has placement of port and feeding tube   01/01/2015 - 02/12/2015 Chemotherapy He received high dose cisplatin   01/01/2015 - 02/18/2015 Radiation Therapy Rec'd concurrent RT to base of tongue and bilateral neck:  70 Gy in 35 fractions to gross disease, 63 Gy in 35 fractions to high risk nodal echelons, 56 Gy in 35 fractions to intermediate risk nodal echelons.   01/21/2015 Adverse Reaction  cycle 2 chemotherapy dose will be reduced by 25% due to intolerable side effects   02/11/2015 Adverse Reaction Cycle 3 of chemotherapy dose will be reduced to 50% due to intolerable side effects   07/17/2015 Procedure Both feeding tube and port were removed          Treatment goal Curative            RADIATION THERAPY:  Location of Radiation Base of tongue and bilateral neck  Radiation Period  Start Date: 01/01/15                End Date: 02/18/15  Number of Radiation Treatments 35 treatments  Total Radiation Dose Base of  tongue: 70 Gy Neck lymph nodes (high risk): 63 Gy Neck lymph nodes (intermediate risk): 56 Gy   Potential Late & Long-Term Side Effects of Head & Neck Radiation Therapy   Fatigue  Dry mouth  Altered taste or loss of taste  Trouble swallowing  Jaw muscle spasms/pain  Skin changes in the area treated with radiation  Neck muscle spasms/pain  Lymphedema (swelling in the face and/or neck)  Hypothyroidism (sluggish thyroid gland)  **Side effects from radiation therapy are dependent on the areas of the body in the treatment field.  These are the most common late/long-term effects for patients treated for base of tongue cancer.     CHEMOTHERAPY: Chemotherapy Given With  Radiation Therapy Yes   Drugs Received Date Started Date Completed # cycles (number of times drug was received) Comments  [x]   High-dose Cisplatin  01/01/15  02/12/15 3 Cycle #2 & Cycle #3  dose reduced due to side effects.     Potential Late & Long-Term Side Effects of Chemotherapy   Fatigue  Neuropathy (numbness/tingling in hands or feet)  Hearing loss (Cisplatin only)  Infertility  Kidney damage/kidney failure  Liver problems  Rare secondary cancers    NUTRITIONAL SUPPORT: Placement of Feeding Tube Yes  Date of Placement 12/24/14  Expected Date of Removal 07/17/15       Survivorship Care & Follow-up Schedule   Becoming a cancer survivor is a time of great celebration, but also a time of many questions and concerns.  The following information and resources are for you, your caregivers, and your primary care doctor to better understand the needs of a cancer survivor.     How often will I see my cancer providers: Survivor Years 0-2  (Survivor Years defined by length of time since cancer diagnosis)   When?  Responsible Provider  Medical Oncology visits Every 1-3 months for first 6-9 months Dr. Alvy Bimler  Radiation Oncology visits Every 6 months Dr. Isidore Moos  Otolaryngologist (ENT) Every 3-6 months  Dr. Janace Hoard  Dentist 1 month after treatment, then as needed Dr. Enrique Sack  Lab tests [facility]) Every 6-12 months Dr. Alvy Bimler or Dr. Isidore Moos  Scans/Imaging exams At 3-4 months after treatment Dr. Enid Derry. Isidore Moos  Nurse Navigator At 3 months, then 6 months after treatm Gayleen Orem, RN  Dietitian Monthly for first 6 months, then as needed Dory Peru, RD  Speech Therapist Every 2 months for 1st 6 months Domingo Cocking, SLP  Social Worker As needed Polo Riley, LCSW  Survivorship Nurse Practitioner (NP) 1-4 months after treatment Mike Craze, NP   How often will I see my cancer providers: Survivor Years 3-5 (Survivor Years defined by length of time since cancer diagnosis)  When?   Responsible Provider  Radiation  Oncology visits 2 Years Dr. Isidore Moos  Dentist As directed Dr. Enrique Sack or your Primary Care Dentist   Survivorship Nurse Practitioner (NP) 2 Years, 6 Months Mike Craze, NP   3 Years Mike Craze, NP   4 Years Mike Craze, NP   5 Years: GRADUATION!  Mike Craze, NP  Lab tests (TSH) At 2 years, 2 years 6 months, 3 years, 4 years, & 5 years.  Dr. Ivonne Andrew, NP     Upcoming Appointments:     Appointment Date & TIme Who am I seeing?   Speech Therapy 08/18/15 at 11:00 am Garald Balding, SLP  CT scan 12/12/15 at 10:00 am (labs) 12/12/15 at 11:00 am (CT scan) --   Radiation Oncology 12/19/15 at 11:00 am Dr. Isidore Moos  ENT Call to make appt for sometime in 09/2015 Dr. Janace Hoard  Dentist Please keep your appointments as scheduled. Primary Care Dentist or Dr. Enrique Sack  Survivorship Nurse Practitioner (NP) As needed Mike Craze, NP  Dietician As needed  Dory Peru, RD  Social Work As needed  [employer], LCSW      Common Complaints/Concerns of Cancer Survivors  *Patients often worry if what they are experiencing is normal, or to be expected, given their cancer history and treatment.  Below are common complaints & concerns reported by cancer survivors. If you are concerned about symptoms you are experiencing, please report all symptoms to your cancer care team!    Common Complaints/Concerns for Head & Neck Cancer Survivors   Fatigue  Memory problems and/or confusion  Depression  Anxiety  Dry mouth  Difficulty swallowing  Changes in your eating habits, your taste, or your smell  Weight changes  Hypothyroidism ("sluggish" thyroid)  Insomnia or trouble sleeping  Lymphedema (swelling in your neck or face)  Decreased range of motion (difficulty moving neck)  Intimacy and sexuality  Marital/partner/family relationships   Employment, health insurance concerns, and/or finances  Concerns regarding spirituality  Exercise  after cancer treatments     Symptoms to Watch For and Report to Your Provider  *Often cancer survivors are fearful about their cancer coming back or being diagnosed with a new cancer.  Below are symptoms that you and your loved ones should watch for and report to your provider.   General Symptoms to Watch For and Report to Your Provider .  Return of the cancer symptoms you had before- such as a lump or new growth where your cancer first started . New or unusual pain that seems unrelated to an injury and does not go away, including back pain or bone pain . Weight loss without trying/intending . Unexplained bleeding . A rash or allergic reaction, such as swelling, severe itching, or wheezing . Chills or fevers . Persistent headaches . Shortness of breath or difficulty breathing . Bloody stools or blood in your urine . Lumps, bumps, swelling and/or nipple discharge . Nausea, vomiting, diarrhea, loss of appetite, or trouble swallowing . A cough that doesn't go away . Abdominal pain . Swelling in your arms or legs . Fractures . Any other signs mentioned by your doctor or nurse or any unusual symptoms                 that you just can't explain   NOTE: Just because you have certain symptoms, it doesn't mean the cancer has come back or you have a new cancer. Symptoms can be due to other problems that need to be addressed.  It is important to watch for these symptoms and report them to your provider so you can be medically evaluated for any of these concerns!      What Now?  Thriving & Surviving In Your Life After Cancer   Consider participating in "Finding Your New Normal" [facility]) survivorship series.   [employer] fitness program for cancer survivors.   Keep your follow-up appointments with all of your specialists (Speech Therapist, Dietician, Physical Therapist, Dentist, etc.)  Consider FREE counseling at cancer center.  For more information, contact Ottis Stain at  [phone].  Attend support group and other Patient [employer] class/program offerings.   Stop smoking or continue to abstain from smoking. If you need help with smoking cessation, talk to your healthcare team about different options to help you!  Limit  alcohol consumption or abstain from consuming alcohol all together.   Maintain adequate nutrition & fluid intake    Thank you so much for allowing [facility] to care for you during your cancer experience.  Our continued commitment is to you and your caregiver(s) health and happiness and again, congratulations on completing treatment & transitioning to survivorship!     Survivorship Nurse Practitioner contact:  Mike Craze, NP Survivorship Program [place] [address] [place],  [address] [phone]

## 2015-07-14 ENCOUNTER — Ambulatory Visit: Payer: Medicare Other | Admitting: Physical Therapy

## 2015-07-14 ENCOUNTER — Encounter: Payer: Self-pay | Admitting: Physical Therapy

## 2015-07-14 ENCOUNTER — Ambulatory Visit: Payer: Medicare Other | Attending: Radiation Oncology

## 2015-07-14 DIAGNOSIS — Z9189 Other specified personal risk factors, not elsewhere classified: Secondary | ICD-10-CM | POA: Diagnosis present

## 2015-07-14 DIAGNOSIS — I890 Lymphedema, not elsewhere classified: Secondary | ICD-10-CM | POA: Diagnosis present

## 2015-07-14 DIAGNOSIS — R1310 Dysphagia, unspecified: Secondary | ICD-10-CM | POA: Insufficient documentation

## 2015-07-14 NOTE — Patient Instructions (Signed)
Remember to do the [facility] - "half swallow" in your regimen as well.  We will see you next month for your last session.

## 2015-07-14 NOTE — Therapy (Signed)
[place], [place], [address] Phone: [phone]   Fax:  [phone]  Physical Therapy Treatment  Patient Details  Name: Nicholas Wilkerson MRN: [id number] Date of Birth: [birth year] Referring Provider:  Heath Lark, MD  Encounter Date: 07/14/2015      PT End of Session - 07/14/15 1429    Visit Number 4   Number of Visits 9   Date for PT Re-Evaluation 08/08/15   PT Start Time 1346   PT Stop Time 1429   PT Time Calculation (min) 43 min   Activity Tolerance Patient tolerated treatment well   Behavior During Therapy [facility] for tasks assessed/performed      Past Medical History  Diagnosis Date  . Hypertension   . Dilated cardiomyopathy ([place])   . Hypercholesterolemia   . DIVERTICULOSIS, COLON 12/08/2007  . ARTHRITIS 10/22/2008  . History of echocardiogram 05/22/2007    EF was 45-50% / Mild concentric LV hypertrophy with mild global hypokinesis and overall mild systolic dysfunction .  Mild AV sclerosis / Mild Mitral insufficiency / compared to prior study 04/24/02 -- LV function has improved further.    . CHF (congestive heart failure) [facility])     sees Dr. Peter [name]   . BPH (benign prostatic hyperplasia)   . Squamous cell carcinoma ([place]) 11/20/14    base of tongue primary  . Skin cancer     squamous cell, basal cell  . H/O asbestos exposure   . Glaucoma   . Radiation 01/01/15-02/18/15    base of tongue and bilateral neck 70 Gy  . Insomnia 06/11/2015    Past Surgical History  Procedure Laterality Date  . Cardiac catheterization  10/17/2001    EF estimated at 30% / moderate LV enlargement  / 1. Minimal nonobstructive atherosclerotic coronary artery disease / 2. Severe LV dysfunction with global hypokinesia consistent with dilated nonischemic cardiomyopath / 3. Moderate pulmonary hypertension  . Colonoscopy  08-15-07    per Dr. Carlean Purl, clear , repeat in 10 yrs  . Lymph node biopsy      There were no  vitals filed for this visit.  Visit Diagnosis:  Acquired lymphedema      Subjective Assessment - 07/14/15 1355    Subjective Ordered my compression garment and waiting for it to arrive.  The chip pack reduces my swelling and I don't feel like my neck is nearly as stiff as it was.   Pertinent History Right neck needle biopsy 11/20/14 showed squamous cell carcinoma; tumor at base of tongue.  Chemoradiation treatments completed 02/18/15.  Still has PEG tube in, but doesn't use it.  Has been eating but hasn't gained much weight.  Otherwise healthy, per his report.  Checks blood pressure regularly at home, but doesn't need medication right now.                                                                       Patient Stated Goals take care of neck swelling   Currently in Pain? No/denies               LYMPHEDEMA/ONCOLOGY QUESTIONNAIRE - 07/14/15 1356    Head and Neck   4 cm superior to sternal notch around neck 36 cm  6 cm superior to sternal notch around neck 37.4 cm   8 cm superior to sternal notch around neck 39.6 cm   Other 10 cm. proximal to sternal notch, 40  38                  [facility] Adult PT Treatment/Exercise - 07/14/15 0001    Manual Therapy   Manual therapy comments Measurements taken at neck today.   Manual Lymphatic Drainage (MLD) In Supine: Short neck, diaphragmatic breathing, short neck, supraclavicular fossa, bil shoulder collectors, bil axillae nodes and bil upper quadrants avoiding port on Rt side, anterior neck, submental and submandibular nodes, bil massters, pre- and retroauricular nodes and suboccipital nodes all directing towards bil lateral neck.            Long Term Clinic Goals - 07/08/15 1202    CC Long Term Goal  #1   Title Independent in performing self-manual lymph drainage.   Status On-going   CC Long Term Goal  #2   Title knowledgeable about where and how to obtain a manufactured neck compression garment   Status Achieved   CC  Long Term Goal  #3   Title neck circumference at 8 cm. proximal to sternal notch reduced to 37.5 cm. or less   Baseline 38.7 cm. on eval , 37 cm on 07/08/2015   Status Achieved           Plan - 07/14/15 1632    Clinical Impression Statement Patient is pleased with his progress and reports his swelling is well controlled with wearing his chip pack.  He is awaiting arrival of his compression garment.  He seesm to have a good understanding of manual lymph drainage.  He will likely be ready for discharge after receiving his garment.   Pt will benefit from skilled therapeutic intervention in order to improve on the following deficits Increased edema;Decreased knowledge of use of DME;Decreased range of motion   Rehab Potential Excellent   PT Frequency 2x / week   PT Duration 4 weeks   PT Treatment/Interventions Manual lymph drainage;Patient/family education;ADLs/Self Care Home Management;Manual techniques;Compression bandaging;DME Instruction   PT Next Visit Plan Continue manual lymph drainage   Consulted and Agree with Plan of Care Patient        Problem List Patient Active Problem List   Diagnosis Date Noted  . Insomnia 06/11/2015  . Skin infection at gastrostomy tube site ([place]) 06/09/2015  . Protein calorie malnutrition ([place]) 03/24/2015  . Depression, acute 03/24/2015  . Dehydration 01/24/2015  . Palpitations 01/24/2015  . Leukopenia due to antineoplastic chemotherapy 01/21/2015  . Mucositis due to chemotherapy 01/14/2015  . Chemotherapy induced nausea and vomiting 01/07/2015  . Weight loss 01/07/2015  . History of skin cancer 12/03/2014  . Cancer of base of tongue ([place]) 12/02/2014  . Chronic systolic CHF (congestive heart failure) ([place]) 07/12/2013  . MUSCLE STRAIN, ABDOMINAL WALL 11/28/2008  . ARTHRITIS 10/22/2008  . BURSITIS, LEFT SHOULDER 07/24/2008  . INTERNAL HEMORRHOIDS 12/08/2007  . EXTERNAL HEMORRHOIDS 12/08/2007  . DIVERTICULOSIS, COLON 12/08/2007  . RECTAL BLEEDING  12/08/2007  . CONGESTIVE HEART FAILURE, HX OF 12/08/2007  . Congestive dilated cardiomyopathy ([place]) 06/13/2007  . HX, PERSONAL, ARTHRITIS 06/13/2007  . Hypercholesterolemia 05/15/2007  . Essential hypertension 05/15/2007   Annia Friendly, PT 07/14/2015 4:35 PM  [place], [place], [address] Phone: [phone]   Fax:  [phone]

## 2015-07-14 NOTE — Therapy (Signed)
[facility] [address] [place], [place], [address] Phone: [phone]   Fax:  [phone]  Speech Language Pathology Treatment  Patient Details  Name: Nicholas Wilkerson MRN: [id number] Date of Birth: 28-Jan-1941 Referring Provider:  Laurey Morale, MD  Encounter Date: 07/14/2015      End of Session - 07/14/15 1101    Visit Number 6   Number of Visits 6   Date for SLP Re-Evaluation 07/16/15  inquiring for renewal today 07-14-15   SLP Start Time 80   SLP Stop Time  1053   SLP Time Calculation (min) 34 min   Activity Tolerance Patient tolerated treatment well      Past Medical History  Diagnosis Date  . Hypertension   . Dilated cardiomyopathy   . Hypercholesterolemia   . DIVERTICULOSIS, COLON 12/08/2007  . ARTHRITIS 10/22/2008  . History of echocardiogram 05/22/2007    EF was 45-50% / Mild concentric LV hypertrophy with mild global hypokinesis and overall mild systolic dysfunction .  Mild AV sclerosis / Mild Mitral insufficiency / compared to prior study 04/24/02 -- LV function has improved further.    . CHF (congestive heart failure)     sees Dr. Peter [name]   . BPH (benign prostatic hyperplasia)   . Squamous cell carcinoma 11/20/14    base of tongue primary  . Skin cancer     squamous cell, basal cell  . H/O asbestos exposure   . Glaucoma   . Radiation 01/01/15-02/18/15    base of tongue and bilateral neck 70 Gy  . Insomnia 06/11/2015    Past Surgical History  Procedure Laterality Date  . Cardiac catheterization  10/17/2001    EF estimated at 30% / moderate LV enlargement  / 1. Minimal nonobstructive atherosclerotic coronary artery disease / 2. Severe LV dysfunction with global hypokinesia consistent with dilated nonischemic cardiomyopath / 3. Moderate pulmonary hypertension  . Colonoscopy  08-15-07    per Dr. Carlean Purl, clear , repeat in 10 yrs  . Lymph node biopsy      There were no vitals filed for this  visit.  Visit Diagnosis: Dysphagia - Plan: SLP plan of care cert/re-cert      Subjective Assessment - 07/14/15 1027    Subjective "My tube is coming out Thursday!" Pt has not used PEG in approx 6 weeks. Rad ended early May 2016.               ADULT SLP TREATMENT - 07/14/15 1026    General Information   Behavior/Cognition Alert;Cooperative;Pleasant mood   Treatment Provided   Treatment provided Dysphagia   Dysphagia Treatment   Temperature Spikes Noted No   Treatment Methods Skilled observation;Therapeutic exercise   Patient observed directly with PO's Yes   Type of PO's observed Dysphagia 3 (soft);Thin liquids   Liquids provided via Cup   Oral Phase Signs & Symptoms --  none in 5 bites/sips   Pharyngeal Phase Signs & Symptoms --  none in 5 bites/sips   Other treatment/comments Pt eating soft solids with thin liquids. Pt uses effortful swallow with all POs. SLP encouraged this cont'd use. HEP completed piecemeal throughout the day, pt completing all but [place]. SLP re-oriented pt to this exercise; pt with good-excellent success.    Pain Assessment   Pain Assessment No/denies pain   Assessment / Recommendations / Plan   Plan --  likely discharge next month/visit   Dysphagia Recommendations   Diet recommendations Thin liquid  as tolerated  Liquids provided via Cup   Progression Toward Goals   Progression toward goals Progressing toward goals          SLP Education - 07/14/15 1042    Education provided Yes   Education Details [employer]) Educated Patient   Methods Explanation;Demonstration   Comprehension Verbalized understanding;Returned demonstration          SLP Short Term Goals - 05/16/15 1046    SLP SHORT TERM GOAL #1   Title pt will complete HEP with rare min A   Time 1   Period --  visit   Status Achieved   SLP SHORT TERM GOAL #2   Title pt will tell SLP why he is completing HEP   Time 1   Period --  visit   Status Achieved           SLP Long Term Goals - 07/14/15 1046    SLP LONG TERM GOAL #1   Title pt will complete HEP with modified independence over two sessions   Status Partially Met  goal not met 03-13-15   SLP LONG TERM GOAL #2   Title pt will tell SLP three signs/symptoms aspiratiion PNA with modified independence   Status Achieved  goal not met 03-13-15   SLP LONG TERM GOAL #3   Title pt to tell SLP how a food journal can assist in return to [place] #4   Title pt will maintain ability to perform HEP with modified independence over a period of two months   Time 2   Period Months   Status On-going   SLP LONG TERM GOAL #5   Title pt will maintain swallowing safety while consuming POs with copmensations PRN, during ST sessions   Status Achieved          Plan - 07/14/15 1102    Clinical Impression Statement Good to excellent success with HEP (min A req'd occasionally for procedure). Pt is consuming soft solids and regular liquids, and today was without overt s/s aspiration during session. No overt s/s aspiration PNA reported or observed today. Pt to have PEG tube removed this week. Pt requires one more session skilled ST to ensure he is completing HEP correctly prior to discharge.   Speech Therapy Frequency Monthly   Duration One additional visit   Treatment/Interventions Pharyngeal strengthening exercises;Oral motor exercises;Compensatory strategies;Patient/family education;SLP instruction and feedback   Potential to Achieve Goals Good   Consulted and Agree with Plan of Care Patient        Problem List Patient Active Problem List   Diagnosis Date Noted  . Insomnia 06/11/2015  . Skin infection at gastrostomy tube site ([place]) 06/09/2015  . Protein calorie malnutrition ([place]) 03/24/2015  . Depression, acute 03/24/2015  . Dehydration 01/24/2015  . Palpitations 01/24/2015  . Leukopenia due to antineoplastic chemotherapy 01/21/2015  . Mucositis due to  chemotherapy 01/14/2015  . Chemotherapy induced nausea and vomiting 01/07/2015  . Weight loss 01/07/2015  . History of skin cancer 12/03/2014  . Cancer of base of tongue ([place]) 12/02/2014  . Chronic systolic CHF (congestive heart failure) ([place]) 07/12/2013  . MUSCLE STRAIN, ABDOMINAL WALL 11/28/2008  . ARTHRITIS 10/22/2008  . BURSITIS, LEFT SHOULDER 07/24/2008  . INTERNAL HEMORRHOIDS 12/08/2007  . EXTERNAL HEMORRHOIDS 12/08/2007  . DIVERTICULOSIS, COLON 12/08/2007  . RECTAL BLEEDING 12/08/2007  . CONGESTIVE HEART FAILURE, HX OF 12/08/2007  . Congestive dilated cardiomyopathy ([place]) 06/13/2007  . HX, PERSONAL, ARTHRITIS 06/13/2007  .  Hypercholesterolemia 05/15/2007  . Essential hypertension 05/15/2007    [facility] , [place], [place]  07/14/2015, 11:06 AM  [facility] [address] [place], [place], [address] Phone: [phone]   Fax:  [phone]

## 2015-07-16 ENCOUNTER — Ambulatory Visit: Payer: Self-pay | Admitting: Adult Health

## 2015-07-16 ENCOUNTER — Other Ambulatory Visit: Payer: Self-pay | Admitting: Radiology

## 2015-07-17 ENCOUNTER — Ambulatory Visit (HOSPITAL_COMMUNITY)
Admission: RE | Admit: 2015-07-17 | Discharge: 2015-07-17 | Disposition: A | Discharge status: Home / Self Care | Payer: Medicare Other | Source: Ambulatory Visit | Attending: Hematology and Oncology | Admitting: Hematology and Oncology

## 2015-07-17 ENCOUNTER — Other Ambulatory Visit: Payer: Self-pay | Admitting: Hematology and Oncology

## 2015-07-17 ENCOUNTER — Ambulatory Visit: Payer: Medicare Other

## 2015-07-17 ENCOUNTER — Encounter (HOSPITAL_COMMUNITY): Payer: Self-pay

## 2015-07-17 DIAGNOSIS — E7800 Pure hypercholesterolemia, unspecified: Secondary | ICD-10-CM | POA: Diagnosis not present

## 2015-07-17 DIAGNOSIS — Z85828 Personal history of other malignant neoplasm of skin: Secondary | ICD-10-CM | POA: Insufficient documentation

## 2015-07-17 DIAGNOSIS — G4700 Insomnia, unspecified: Secondary | ICD-10-CM | POA: Insufficient documentation

## 2015-07-17 DIAGNOSIS — M1990 Unspecified osteoarthritis, unspecified site: Secondary | ICD-10-CM | POA: Diagnosis not present

## 2015-07-17 DIAGNOSIS — I420 Dilated cardiomyopathy: Secondary | ICD-10-CM | POA: Insufficient documentation

## 2015-07-17 DIAGNOSIS — Z7982 Long term (current) use of aspirin: Secondary | ICD-10-CM | POA: Diagnosis not present

## 2015-07-17 DIAGNOSIS — Z431 Encounter for attention to gastrostomy: Secondary | ICD-10-CM | POA: Diagnosis not present

## 2015-07-17 DIAGNOSIS — Z452 Encounter for adjustment and management of vascular access device: Secondary | ICD-10-CM | POA: Diagnosis not present

## 2015-07-17 DIAGNOSIS — C01 Malignant neoplasm of base of tongue: Secondary | ICD-10-CM

## 2015-07-17 DIAGNOSIS — I110 Hypertensive heart disease with heart failure: Secondary | ICD-10-CM | POA: Diagnosis not present

## 2015-07-17 DIAGNOSIS — Z85810 Personal history of malignant neoplasm of tongue: Secondary | ICD-10-CM | POA: Insufficient documentation

## 2015-07-17 DIAGNOSIS — I509 Heart failure, unspecified: Secondary | ICD-10-CM | POA: Diagnosis not present

## 2015-07-17 DIAGNOSIS — N400 Enlarged prostate without lower urinary tract symptoms: Secondary | ICD-10-CM | POA: Insufficient documentation

## 2015-07-17 LAB — PROTIME-INR
INR: 0.95 (ref 0.00–1.49)
Prothrombin Time: 12.9 seconds (ref 11.6–15.2)

## 2015-07-17 LAB — CBC
HEMATOCRIT: 45.6 % (ref 39.0–52.0)
HEMOGLOBIN: 14.7 g/dL (ref 13.0–17.0)
MCH: 29.9 pg (ref 26.0–34.0)
MCHC: 32.2 g/dL (ref 30.0–36.0)
MCV: 92.7 fL (ref 78.0–100.0)
Platelets: 159 10*3/uL (ref 150–400)
RBC: 4.92 MIL/uL (ref 4.22–5.81)
RDW: 12.9 % (ref 11.5–15.5)
WBC: 4.8 10*3/uL (ref 4.0–10.5)

## 2015-07-17 MED ORDER — SODIUM CHLORIDE 0.9 % IV SOLN
INTRAVENOUS | Status: DC
  Administered 2015-07-17: 500 mL via INTRAVENOUS

## 2015-07-17 MED ORDER — MIDAZOLAM HCL 2 MG/2ML IJ SOLN
INTRAMUSCULAR | Status: AC | PRN
  Administered 2015-07-17 (×3): 1 mg via INTRAVENOUS

## 2015-07-17 MED ORDER — FENTANYL CITRATE (PF) 100 MCG/2ML IJ SOLN
INTRAMUSCULAR | Status: AC
  Filled 2015-07-17: qty 4

## 2015-07-17 MED ORDER — FENTANYL CITRATE (PF) 100 MCG/2ML IJ SOLN
INTRAMUSCULAR | Status: AC | PRN
  Administered 2015-07-17 (×2): 50 ug via INTRAVENOUS

## 2015-07-17 MED ORDER — CEFAZOLIN SODIUM-DEXTROSE 2-3 GM-% IV SOLR
2.0000 g | Freq: Once | INTRAVENOUS | Status: AC
  Administered 2015-07-17: 2 g via INTRAVENOUS

## 2015-07-17 MED ORDER — CEFAZOLIN SODIUM-DEXTROSE 2-3 GM-% IV SOLR
INTRAVENOUS | Status: AC
  Filled 2015-07-17: qty 50

## 2015-07-17 MED ORDER — LIDOCAINE HCL 1 % IJ SOLN
INTRAMUSCULAR | Status: AC
  Filled 2015-07-17: qty 20

## 2015-07-17 MED ORDER — MIDAZOLAM HCL 2 MG/2ML IJ SOLN
INTRAMUSCULAR | Status: AC
  Filled 2015-07-17: qty 6

## 2015-07-17 NOTE — H&P (Signed)
Chief Complaint: Patient was seen in consultation today for squamous cell carcinoma at the request of Gorsuch,Ni  Referring Physician(s): Gorsuch,Ni  History of Present Illness: Nicholas Wilkerson is a 74 y.o. male with history of squamous cell carcinoma of base of tongue who follows with Dr. Alvy Bimler and has finished treatment. Recent PET 06/17/2015 with no concern for recurrence and request is made to have right port a catheter and percutaneous gastrostomy tube removed that is no longer needed. Port and G-tube placed 12/2014 by IR. The patient states he is eating well without difficulty. He denies any chest pain, shortness of breath or palpitations. He denies any active signs of bleeding or excessive bruising. He denies any recent fever or chills. The patient denies any history of sleep apnea or chronic oxygen use. He has previously tolerated sedation without complications.   Past Medical History  Diagnosis Date  . Hypertension   . Dilated cardiomyopathy ([place])   . Hypercholesterolemia   . DIVERTICULOSIS, COLON 12/08/2007  . ARTHRITIS 10/22/2008  . History of echocardiogram 05/22/2007    EF was 45-50% / Mild concentric LV hypertrophy with mild global hypokinesis and overall mild systolic dysfunction .  Mild AV sclerosis / Mild Mitral insufficiency / compared to prior study 04/24/02 -- LV function has improved further.    . CHF (congestive heart failure) [facility])     sees Dr. Peter [name]   . BPH (benign prostatic hyperplasia)   . Squamous cell carcinoma ([place]) 11/20/14    base of tongue primary  . Skin cancer     squamous cell, basal cell  . H/O asbestos exposure   . Glaucoma   . Radiation 01/01/15-02/18/15    base of tongue and bilateral neck 70 Gy  . Insomnia 06/11/2015    Past Surgical History  Procedure Laterality Date  . Cardiac catheterization  10/17/2001    EF estimated at 30% / moderate LV enlargement  / 1. Minimal nonobstructive atherosclerotic coronary artery disease / 2. Severe  LV dysfunction with global hypokinesia consistent with dilated nonischemic cardiomyopath / 3. Moderate pulmonary hypertension  . Colonoscopy  08-15-07    per Dr. Carlean Purl, clear , repeat in 10 yrs  . Lymph node biopsy      Allergies: Review of patient's allergies indicates no known allergies.  Medications: Prior to Admission medications   Medication Sig Start Date End Date Taking? Authorizing Provider  aspirin EC 81 MG tablet Take 81 mg by mouth daily.   Yes Historical Provider, MD  carvedilol (COREG) 25 MG tablet Take 1 tablet (25 mg total) by mouth 2 (two) times daily with a meal. 08/06/14  Yes Laurey Morale, MD  mirtazapine (REMERON) 15 MG tablet Take 15 mg by mouth at bedtime.   Yes Historical Provider, MD  sodium fluoride (FLUORISHIELD) 1.1 % GEL dental gel Instill one drop of gel per tooth space of fluoride tray. Place over teeth for 5 minutes. Remove. Spit out excess. Repeat nightly. 12/17/14  Yes Lenn Cal, DDS  tadalafil (CIALIS) 20 MG tablet Take 1 tablet (20 mg total) by mouth daily as needed for erectile dysfunction. 09/13/13  Yes Laurey Morale, MD  tamsulosin (FLOMAX) 0.4 MG CAPS capsule TAKE 1 CAPSULE BY MOUTH DAILY 07/02/15  Yes Laurey Morale, MD  TRAVATAN Z 0.004 % SOLN ophthalmic solution Place 1 drop into both eyes at bedtime.  11/29/13  Yes Historical Provider, MD  traZODone (DESYREL) 50 MG tablet Take 1 tablet (50 mg total) by mouth  at bedtime. 06/11/15  Yes Heath Lark, MD  cephALEXin (KEFLEX) 500 MG capsule Take 1 capsule (500 mg total) by mouth 2 (two) times daily. Patient not taking: Reported on 06/27/2015 06/09/15   Heath Lark, MD  miconazole (MICATIN) 2 % cream Apply as directed TID for 10 days Patient not taking: Reported on 06/27/2015 06/20/15   Eppie Gibson, MD  Nutritional Supplements (FEEDING SUPPLEMENT, OSMOLITE 1.5 CAL,) LIQD Give 1.5 cans of Osmolite 1.5 via PEG QID with 60 cc free water before and after bolus feedings. Drink or flush tube with an additional 240 cc  water TID between feedings. Patient not taking: Reported on 06/20/2015 03/24/15   Heath Lark, MD     Family History  Problem Relation Age of Onset  . Stroke Father   . Cancer Father     kidney ca  . Angina Mother     Social History   Social History  . Marital Status: Married    Spouse Name: N/A  . Number of Children: 4  . Years of Education: N/A   Occupational History  . 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 Smoking status: Never Smoker   . Smokeless tobacco: Never Used  . Alcohol Use: 0.0 oz/week    0 Standard drinks or equivalent per week     Comment: occl  . Drug Use: No  . Sexual Activity: Not Asked     Comment: retired Pension scheme manager, married   Other Topics Concern  . None   Social History Narrative    Review of Systems: A 12 point ROS discussed and pertinent positives are indicated in the HPI above.  All other systems are negative.  Review of Systems  Vital Signs: BP 126/80 mmHg  Pulse 72  Temp(Src) 98.5 F (36.9 C)  Resp 18  Ht 5\' 8"  (1.727 m)  Wt 153 lb 3 oz (69.485 kg)  BMI 23.30 kg/m2  SpO2 99%  Physical Exam  Constitutional: He is oriented to person, place, and time. No distress.  HENT:  Head: Normocephalic and atraumatic.  Cardiovascular: Normal rate and regular rhythm.  Exam reveals no gallop and no friction rub.   No murmur heard. Pulmonary/Chest: Effort normal and breath sounds normal. No respiratory distress. He has no wheezes. He has no rales.  Abdominal: Soft. Bowel sounds are normal. He exhibits no distension. There is no tenderness.  G-tube dressing intact  Neurological: He is alert and oriented to person, place, and time.  Skin: Skin is warm and dry. He is not diaphoretic.  Right chest wall port intact, well healed    Mallampati Score:  MD Evaluation Airway: WNL Heart: WNL Abdomen: WNL Chest/ Lungs: WNL ASA  Classification: 2 Mallampati/Airway Score: Two  Imaging: No results  found.  Labs:  CBC:  Recent Labs  03/12/15 1030 04/14/15 1042 06/09/15 1529 07/17/15 1255  WBC 5.5 8.8 5.5 4.8  HGB 13.2 16.2 13.6 14.7  HCT 40.0 47.2 41.2 45.6  PLT 205 257 200 159    COAGS:  Recent Labs  12/24/14 0755 07/17/15 1255  INR 1.01 0.95  APTT 29  --     BMP:  Recent Labs  09/09/14 0838  12/24/14 0755  03/12/15 1030 04/14/15 1042 04/16/15 1302 06/09/15 1402  NA 140  < > 141  < > 136 139 140 139  K 3.9  < > 3.8  < > 4.1 3.8 4.2 4.0  CL 108  --  109  --   --  100*  --   --   CO2 24  < > 27  < > 28 26 27 24   GLUCOSE 102*  < > 118*  < > 149* 172* 134 113  BUN 19  < > 15  < > 16.1 23* 29.1* 20.9  CALCIUM 9.0  < > 8.9  < > 9.2 10.8* 9.6 9.3  CREATININE 1.1  < > 0.99  < > 0.9 1.49* 1.2 1.0  GFRNONAA  --   --  80*  --   --  45*  --   --   GFRAA  --   --  >90  --   --  53*  --   --   < > = values in this interval not displayed.  LIVER FUNCTION TESTS:  Recent Labs  03/12/15 1030 04/14/15 1042 04/16/15 1302 06/09/15 1402  BILITOT 0.31 0.8 0.46 0.22  AST 18 24 31 19   ALT 19 19 28 26   ALKPHOS 84 92 65 76  PROT 6.2* 8.9* 6.6 6.5  ALBUMIN 3.2* 4.9 3.7 3.7    Assessment and Plan: Squamous cell carcinoma base of tongue finished treatment with no evidence of recurrence  S/p right port a catheter and percutaneous gastrostomy tube placed 12/2014 Seen by Dr. Alvy Bimler and request for removal of port and G-tube that is no longer needed The patient has been NPO, no blood thinners taken, labs and vitals have been reviewed. Risks and Benefits discussed with the patient including, but not limited to bleeding, infection, or pneumothorax All of the patient's questions were answered, patient is agreeable to proceed. Consent signed and in chart.    Thank you for this interesting consult.  I greatly enjoyed meeting Nicholas Wilkerson and look forward to participating in their care.  A copy of this report was sent to the requesting provider on this  date.  SignedHedy Jacob 07/17/2015, 2:22 PM   I spent a total of 15 Minutes in face to face in clinical consultation, greater than 50% of which was counseling/coordinating care for squamous cell carcinoma.

## 2015-07-17 NOTE — Discharge Instructions (Signed)
Incision Care ° An incision (cut) is when a surgeon cuts into your body. After surgery, the cut needs to be well cared for to keep it from getting infected.  °HOW TO CARE FOR YOUR CUT °· Take medicines only as told by your doctor. °· There are many different ways to close and cover a cut, including stitches, skin glue, and adhesive strips. Follow your doctor's instructions on: °¨ Care of the cut. °¨ Bandage (dressing) changes and removal. °¨ Cut closure removal. °· Do not take baths, swim, or use a hot tub until your doctor says it is okay. You may shower as told by your doctor. °· Return to your normal diet and activities as allowed by your doctor. °· Use medicine that helps lessen itching on your cut as told by your doctor. Do not pick or scratch at your cut. °· Drink enough fluids to keep your pee (urine) clear or pale yellow. °GET HELP IF: °· You have redness, puffiness (swelling), or pain at the site of your cut. °· You have fluid, blood, or pus coming from your cut. °· Your muscles ache. °· You have chills or you feel sick. °· You have a bad smell coming from the cut or bandage. °· Your cut opens up after stitches, staples, or adhesive strips have been removed. °· You keep feeling sick to your stomach (nauseous) or keep throwing up (vomiting). °· You have a fever. °· You are dizzy. °GET HELP RIGHT AWAY IF: °· You have a rash. °· You pass out (faint). °· You have trouble breathing. °MAKE SURE YOU:  °· Understand these instructions. °· Will watch your condition. °· Will get help right away if you are not doing well or get worse. °  °This information is not intended to replace advice given to you by your health care provider. Make sure you discuss any questions you have with your health care provider. °  °Document Released: 12/20/2011 Document Revised: 10/18/2014 Document Reviewed: 11/21/2013 °Elsevier Interactive Patient Education ©2016 Elsevier Inc[employer] °Moderate Conscious Sedation, Adult, Care After °Refer to this  sheet in the next few weeks. These instructions provide you with information on caring for yourself after your procedure. Your health care provider may also give you more specific instructions. Your treatment has been planned according to current medical practices, but problems sometimes occur. Call your health care provider if you have any problems or questions after your procedure. °WHAT TO EXPECT AFTER THE PROCEDURE  °After your procedure: °· You may feel sleepy, clumsy, and have poor balance for several hours. °· Vomiting may occur if you eat too soon after the procedure. °HOME CARE INSTRUCTIONS °· Do not participate in any activities where you could become injured for at least 24 hours. Do not: °¨ Drive. °¨ Swim. °¨ Ride a bicycle. °¨ Operate heavy machinery. °¨ Cook. °¨ Use power tools. °¨ Climb ladders. °¨ Work from a high place. °· Do not make important decisions or sign legal documents until you are improved. °· If you vomit, drink water, juice, or soup when you can drink without vomiting. Make sure you have little or no nausea before eating solid foods. °· Only take over-the-counter or prescription medicines for pain, discomfort, or fever as directed by your health care provider. °· Make sure you and your family fully understand everything about the medicines given to you, including what side effects may occur. °· You should not drink alcohol, take sleeping pills, or take medicines that cause drowsiness for at least 24   hours. °· If you smoke, do not smoke without supervision. °· If you are feeling better, you may resume normal activities 24 hours after you were sedated. °· Keep all appointments with your health care provider. °SEEK MEDICAL CARE IF: °· Your skin is pale or bluish in color. °· You continue to feel nauseous or vomit. °· Your pain is getting worse and is not helped by medicine. °· You have bleeding or swelling. °· You are still sleepy or feeling clumsy after 24 hours. °SEEK IMMEDIATE MEDICAL  CARE IF: °· You develop a rash. °· You have difficulty breathing. °· You develop any type of allergic problem. °· You have a fever. °MAKE SURE YOU: °· Understand these instructions. °· Will watch your condition. °· Will get help right away if you are not doing well or get worse. °  °This information is not intended to replace advice given to you by your health care provider. Make sure you discuss any questions you have with your health care provider. °  °Document Released: 07/18/2013 Document Revised: 10/18/2014 Document Reviewed: 07/18/2013 °Elsevier Interactive Patient Education ©2016 Elsevier Inc. ° °

## 2015-07-17 NOTE — Procedures (Signed)
Interventional Radiology Procedure Note  Procedure: Bedside removal of a right IJ approach single lumen PowerPort.  Bedside removal of 75F pull through gastrostomy tube.  Complications: None Recommendations:  - Ok to shower tomorrow - Do not submerge for 7 days - routine dressing changes on the g-tube site until secondary intention has healed the site.   Signed,  Dulcy Fanny. Earleen Newport, DO

## 2015-07-21 ENCOUNTER — Ambulatory Visit: Payer: Medicare Other

## 2015-07-21 DIAGNOSIS — I890 Lymphedema, not elsewhere classified: Secondary | ICD-10-CM

## 2015-07-21 DIAGNOSIS — R1310 Dysphagia, unspecified: Secondary | ICD-10-CM | POA: Diagnosis not present

## 2015-07-21 DIAGNOSIS — Z9189 Other specified personal risk factors, not elsewhere classified: Secondary | ICD-10-CM

## 2015-07-21 NOTE — Therapy (Signed)
[place], [place], [address] Phone: [phone]   Fax:  [phone]  Physical Therapy Treatment  Patient Details  Name: Nicholas Wilkerson MRN: [id number] Date of Birth: [birth year] Referring Provider:  Laurey Morale, MD  Encounter Date: 07/21/2015      PT End of Session - 07/21/15 1023    Visit Number 5   Number of Visits 9   Date for PT Re-Evaluation 08/08/15   PT Start Time 0939   PT Stop Time 1021   PT Time Calculation (min) 42 min   Activity Tolerance Patient tolerated treatment well   Behavior During Therapy [facility] for tasks assessed/performed      Past Medical History  Diagnosis Date  . Hypertension   . Dilated cardiomyopathy ([place])   . Hypercholesterolemia   . DIVERTICULOSIS, COLON 12/08/2007  . ARTHRITIS 10/22/2008  . History of echocardiogram 05/22/2007    EF was 45-50% / Mild concentric LV hypertrophy with mild global hypokinesis and overall mild systolic dysfunction .  Mild AV sclerosis / Mild Mitral insufficiency / compared to prior study 04/24/02 -- LV function has improved further.    . CHF (congestive heart failure) [facility])     sees Dr. Peter [name]   . BPH (benign prostatic hyperplasia)   . Squamous cell carcinoma ([place]) 11/20/14    base of tongue primary  . Skin cancer     squamous cell, basal cell  . H/O asbestos exposure   . Glaucoma   . Radiation 01/01/15-02/18/15    base of tongue and bilateral neck 70 Gy  . Insomnia 06/11/2015    Past Surgical History  Procedure Laterality Date  . Cardiac catheterization  10/17/2001    EF estimated at 30% / moderate LV enlargement  / 1. Minimal nonobstructive atherosclerotic coronary artery disease / 2. Severe LV dysfunction with global hypokinesia consistent with dilated nonischemic cardiomyopath / 3. Moderate pulmonary hypertension  . Colonoscopy  08-15-07    per Dr. Carlean Purl, clear , repeat in 10 yrs  . Lymph node biopsy      There were  no vitals filed for this visit.  Visit Diagnosis:  Acquired lymphedema  At risk for lymphedema      Subjective Assessment - 07/21/15 0942    Subjective I got my compression and I've been putting my chip pack in it and I really like it. My chip pack is coming apart though. And I got my feeding tube and port  out last week too and those are healing really well.    Currently in Pain? No/denies                         [facility] Adult PT Treatment/Exercise - 07/21/15 0001    Manual Therapy   Edema Management Issued a new chip pack as pt reports his coming apart from so much use.    Manual Lymphatic Drainage (MLD) In Supine: Short neck, superficial and deep abdominals , short neck, supraclavicular fossa, bil shoulder collectors, bil axillae nodes and bil upper quadrants avoiding port on Rt side, anterior neck, submental and submandibular nodes, bil massters, pre- and retroauricular nodes and suboccipital nodes all directing towards bil lateral neck.                        [facility] - 07/21/15 1028    CC Long Term Goal  #1   Title Independent in performing  self-manual lymph drainage.   Status Partially Met         [facility] Ne[facility] Clinic Goals - 12/11/14 1421    Patient will be able to verbalize understanding of a home exercise program for cervical range of motion, posture, and walking.    Status Achieved   Patient will be able to verbalize understanding of proper sitting and standing posture.    Status Achieved   Patient will be able to verbalize understanding of lymphedema risk and availability of treatment for this condition.    Status Achieved           Plan - 07/21/15 1024    Clinical Impression Statement Pt continues to do very well with treatment and has just received his garment last week.  Issued a new chip pack for pt to wear in his new compression garment and he reports it fits good as well and seems easier to use  than the velcro compression bandage he was using.    Pt will benefit from skilled therapeutic intervention in order to improve on the following deficits Increased edema;Decreased knowledge of use of DME;Decreased range of motion   Rehab Potential Excellent   Clinical Impairments Affecting Rehab Potential radiation therapy    PT Frequency 2x / week   PT Duration 4 weeks   PT Next Visit Plan Pt to come 1 more visit next week and then discharge. Review manual lymph drainage and remeasure circumference obe more time for goal assess.    Consulted and Agree with Plan of Care Patient        Problem List Patient Active Problem List   Diagnosis Date Noted  . Insomnia 06/11/2015  . Skin infection at gastrostomy tube site ([place]) 06/09/2015  . Protein calorie malnutrition ([place]) 03/24/2015  . Depression, acute 03/24/2015  . Dehydration 01/24/2015  . Palpitations 01/24/2015  . Leukopenia due to antineoplastic chemotherapy 01/21/2015  . Mucositis due to chemotherapy 01/14/2015  . Chemotherapy induced nausea and vomiting 01/07/2015  . Weight loss 01/07/2015  . History of skin cancer 12/03/2014  . Cancer of base of tongue ([place]) 12/02/2014  . Chronic systolic CHF (congestive heart failure) ([place]) 07/12/2013  . MUSCLE STRAIN, ABDOMINAL WALL 11/28/2008  . ARTHRITIS 10/22/2008  . BURSITIS, LEFT SHOULDER 07/24/2008  . INTERNAL HEMORRHOIDS 12/08/2007  . EXTERNAL HEMORRHOIDS 12/08/2007  . DIVERTICULOSIS, COLON 12/08/2007  . RECTAL BLEEDING 12/08/2007  . CONGESTIVE HEART FAILURE, HX OF 12/08/2007  . Congestive dilated cardiomyopathy ([place]) 06/13/2007  . HX, PERSONAL, ARTHRITIS 06/13/2007  . Hypercholesterolemia 05/15/2007  . Essential hypertension 05/15/2007    Otelia Limes, PTA 07/21/2015, 10:29 AM  [place], [place], [address] Phone: [phone]   Fax:  [phone]

## 2015-07-23 ENCOUNTER — Encounter: Payer: Self-pay | Admitting: *Deleted

## 2015-07-23 NOTE — Progress Notes (Signed)
Nicholas Wilkerson and his wife attended the Fall 2016 5-week [facility] program (Tuesday evening sessions, 6:00-7:15 pm, [facility], beginning 06/24/15).  Gayleen Orem, RN, BSN, [place] at [place] [phone]

## 2015-07-24 ENCOUNTER — Ambulatory Visit: Payer: Medicare Other

## 2015-07-25 ENCOUNTER — Telehealth: Payer: Self-pay | Admitting: *Deleted

## 2015-07-25 NOTE — Telephone Encounter (Signed)
  Oncology Nurse Navigator Documentation   Navigator Encounter Type: Telephone (07/25/15 1333)         Interventions: Coordination of Care (07/25/15 1333)     Spoke with patient, confirmed a 12:30 arrival on 07/30/15 for 12:45 Survivorship appt with Mike Craze, NP.  Gayleen Orem, RN, BSN, [place] at [place] [phone]           Time Spent with Patient: 15 (07/25/15 1333)

## 2015-07-29 ENCOUNTER — Telehealth: Payer: Self-pay | Admitting: *Deleted

## 2015-07-29 NOTE — Telephone Encounter (Signed)
  Oncology Nurse Navigator Documentation   Navigator Encounter Type: Telephone (07/29/15 1417)         Interventions: Other (07/29/15 1417)     Nicholas Wilkerson called to confirm he will be attending [facility] tomorrow for Survivorship. He understands Dory Peru will also see him to address current nutritional status.  Gayleen Orem, RN, BSN, [place] at [place] [phone]           Time Spent with Patient: 15 (07/29/15 1417)

## 2015-07-29 NOTE — Telephone Encounter (Signed)
  Oncology Nurse Navigator Documentation   Navigator Encounter Type: Telephone (07/29/15 1323)         Interventions: Coordination of Care (07/29/15 1323)     LVMM on [name]'s mobile and home phones reminding him of tomorrow's 12:30 arrival for a 12:45 Survivorship appt with Mike Craze.  Asked that he return my call to confirm.  Gayleen Orem, RN, BSN, [place] at [place] [phone]           Time Spent with Patient: 15 (07/29/15 1323)

## 2015-07-29 NOTE — Progress Notes (Signed)
CLINIC:  Cancer Survivorship [facility]) Clinic   REASON FOR VISIT:  Routine follow-up post-treatment for a recent history of head & neck cancer.  BRIEF ONCOLOGIC HISTORY:  Oncology History   Cancer of base of tongue   Staging form: Lip and Oral Cavity, AJCC 7th Edition     Clinical stage from 12/03/2014: Stage IVA (T2, N2a, M0) - Signed by Heath Lark, MD on 12/13/2014 HPV positive     Cancer of base of tongue ([place])   11/20/2014 Pathology Results Accession: [id number] confirm squamous cell carcinoma.   11/20/2014 Procedure The patient underwent fine-needle aspirate of the right lymph node   12/02/2014 Imaging CT scan showed 2.2 cm the right tongue base mass with necrotic 4 cm right level II nodal metastasis with multiple bilateral small lymphadenopathy   12/10/2014 Imaging PET CT scan showed tongue base mass with right level II lymph node metastasis   12/18/2014 Pathology Results PCR results from [facility]: HPV 16 positive.  HPV 18 and other types negative.    12/24/2014 Procedure He has placement of port and feeding tube   01/01/2015 - 02/12/2015 Chemotherapy He received high dose cisplatin   01/01/2015 - 02/18/2015 Radiation Therapy Rec'd concurrent RT to base of tongue and bilateral neck:  70 Gy in 35 fractions to gross disease, 63 Gy in 35 fractions to high risk nodal echelons, 56 Gy in 35 fractions to intermediate risk nodal echelons.   01/21/2015 Adverse Reaction  cycle 2 chemotherapy dose will be reduced by 25% due to intolerable side effects   02/11/2015 Adverse Reaction Cycle 3 of chemotherapy dose will be reduced to 50% due to intolerable side effects   07/17/2015 Procedure Both feeding tube and port were removed    INTERVAL HISTORY:  Nicholas Wilkerson presents to the [facility] today for our initial meeting to review the survivorship care plan detailing his treatment course for head & neck cancer, as well as monitoring long-term side effects of that treatment, education  regarding health maintenance, screening, and overall wellness and health promotion.     Overall, Nicholas Wilkerson reports feeling quite well since completing his concurrent radiation/chemotherapy with high-dose Cisplatin approximately 5 months ago.  Below is a complete review of systems:    Pain: None.   Energy levels:  "Great." Reports playing a full round of golf yesterday and has been doing extensive yard work at his home without difficulty.   Nutritional Status:  -Intake/Diet: Regular diet; minor taste alterations. Able to enjoy most foods without difficulty. "Hamburger still doesn't taste good."  -Using a feeding tube? No, removed 07/17/15 -Weight change: GAIN since 07/09/15 (last weight 153)  Swallowing:  No difficulty. Denies pain. Working with Garald Balding, SLP and doing swallowing exercises.   Last ENT visit: 4 months ago per patient  Last Dentist visit:  Using fluoride trays daily.  "I see my primary care dentist now and they say I am doing great."   Summary of last Med Onc visit: 06/09/15-[place]-Dehydration requiring IV fluids, otherwise doing well. Started antibx for mild G-tube site infection. Pending PET results, will have G-tube and port removed if NED.   Summary of last Rad Onc visit:  06/20/15-[place]-No pain, energy levels improving. Referred to PT for mild neck lymphedema.   Last TSH value: Normal-TSH 1.273 on 06/09/15  Last Imaging:  PET-06/17/15-Prior activity in BOT and lymph nodes resolved. No evidence of disease recurrence.  Small subcarinal & right hilar lymph nodes likely suggest old granulomatous dosease; haven't grown since prior scan.  ADDITIONAL REVIEW OF SYSTEMS:  General: Denies unintentional weight loss or generalized fatigue.   HEENT: Denies changes in hearing. Cardiac: Denies chest pain and lower extremity edema.  GI: Denies constipation, diarrhea, nausea, or vomiting.  GU: Denies dysuria, hematuria.  Endorses nocturia.  Neuro: Endorses occasional peripheral neuropathy  s/p chemo to bilateral feet.  Worse at night, better with movement.  Skin: Denies rash, pruritis. Recent port-a-cath and G-tube removal, both are healing per patient.  Psych: Denies depression, anxiety.  Endorses insomnia, which he relates to nocturia because "I have to drink so much fluid when I eat anything that I then have to use the restroom a lot at night."   A 14-point review of systems was completed and was negative, except as noted above.   ONCOLOGY TREATMENT TEAM:  1. ENT: Dr. Janace Hoard 2. Medical Oncologist: Dr. Alvy Bimler  3. Radiation Oncologist: Dr. Isidore Moos    PAST MEDICAL/SURGICAL HISTORY:  Past Medical History  Diagnosis Date  . Hypertension   . Dilated cardiomyopathy ([place])   . Hypercholesterolemia   . DIVERTICULOSIS, COLON 12/08/2007  . ARTHRITIS 10/22/2008  . History of echocardiogram 05/22/2007    EF was 45-50% / Mild concentric LV hypertrophy with mild global hypokinesis and overall mild systolic dysfunction .  Mild AV sclerosis / Mild Mitral insufficiency / compared to prior study 04/24/02 -- LV function has improved further.    . CHF (congestive heart failure) [facility])     sees Dr. Peter [name]   . BPH (benign prostatic hyperplasia)   . Squamous cell carcinoma ([place]) 11/20/14    base of tongue primary  . Skin cancer     squamous cell, basal cell  . H/O asbestos exposure   . Glaucoma   . Radiation 01/01/15-02/18/15    base of tongue and bilateral neck 70 Gy  . Insomnia 06/11/2015   Past Surgical History  Procedure Laterality Date  . Cardiac catheterization  10/17/2001    EF estimated at 30% / moderate LV enlargement  / 1. Minimal nonobstructive atherosclerotic coronary artery disease / 2. Severe LV dysfunction with global hypokinesia consistent with dilated nonischemic cardiomyopath / 3. Moderate pulmonary hypertension  . Colonoscopy  08-15-07    per Dr. Carlean Purl, clear , repeat in 10 yrs  . Lymph node biopsy       ALLERGIES:  No Known Allergies    CURRENT  MEDICATIONS:  Current Outpatient Prescriptions on File Prior to Visit  Medication Sig Dispense Refill  . aspirin EC 81 MG tablet Take 81 mg by mouth daily.    . carvedilol (COREG) 25 MG tablet Take 1 tablet (25 mg total) by mouth 2 (two) times daily with a meal. 180 tablet 3  . mirtazapine (REMERON) 15 MG tablet Take 15 mg by mouth at bedtime.    . sodium fluoride (FLUORISHIELD) 1.1 % GEL dental gel Instill one drop of gel per tooth space of fluoride tray. Place over teeth for 5 minutes. Remove. Spit out excess. Repeat nightly. 120 mL prn  . tadalafil (CIALIS) 20 MG tablet Take 1 tablet (20 mg total) by mouth daily as needed for erectile dysfunction. 30 tablet 3  . tamsulosin (FLOMAX) 0.4 MG CAPS capsule TAKE 1 CAPSULE BY MOUTH DAILY 30 capsule 1  . TRAVATAN Z 0.004 % SOLN ophthalmic solution Place 1 drop into both eyes at bedtime.     . traZODone (DESYREL) 50 MG tablet Take 1 tablet (50 mg total) by mouth at bedtime. 30 tablet 3   No current facility-administered medications  on file prior to visit.       ONCOLOGIC FAMILY HISTORY:  Family History  Problem Relation Age of Onset  . Stroke Father   . Cancer Father     kidney ca  . Angina Mother       SOCIAL HISTORY:  Mr. Ulbrich is married and lives with his wife in [place], [place].  He has 4 daughters and several grandchildren.  Mr. Morozov  is currently retired and previously worked as a Psychologist, forensic.  He enjoys golfing and spending time with his family.  He denies any current use of tobacco, alcohol, or illicit drug use.  He has never smoked. He reports history of only occasional alcohol consumption.   PHYSICAL EXAMINATION:  Vital Signs: Filed Vitals:   07/30/15 1349  BP: 136/79  Pulse: 97  Temp: 97.3 F (36.3 C)  Resp: 14    Filed Weights   07/30/15 1349  Weight: 154 lb 3.2 oz (69.945 kg)   General: Well-nourished, well-appearing in no acute distress.  He is unaccompanied today.   HEENT: Head is atraumatic and  normocephalic.  Pupils equal and reactive to light. Conjunctivae mildly erythematous without exudate.  Sclerae anicteric. Oral mucosa is pink, moist, and intact without lesions.  Tongue pink, moist, and midline. Oropharynx is pink without lesions or erythema.  Lymph: Mild submental lymphedema.  Cardiovascular: Regular rate and rhythm. Respiratory: Clear to auscultation bilaterally. Chest expansion symmetric without accessory muscle use on inspiration or expiration.  GI: Abdomen soft and round. No tenderness to palpation. Bowel sounds normoactive in 4 quadrants. No hepatosplenomegaly.  Old G-tube site healing.  GU: Deferred.  Neuro: No focal deficits. Steady gait.  Psych: Mood and affect normal and appropriate for situation.  Extremities: No edema, cyanosis, or clubbing.  Skin: Warm and dry. No open lesions noted. Right upper chest with slight bruising s/p port-a-cath removal. No signs of infection; surgi-glue intact.   LABORATORY DATA:  None for this visit.   DIAGNOSTIC IMAGING:  None for this visit.     ASSESSMENT AND PLAN:   1. History of base of tongue (BOT) cancer:  Mr. Moeller is clinically without evidence of cancer recurrence today.  He will follow-up with his ENT physician in 09/2015 with history and physical exam per surveillance protocol.  On his PET scan on 06/17/15, there was mention of small subcarinal and right hilar lymph nodes, likely suggestive of old granulomatous disease as they have not grown in size from previous scans.  However, Mr. Rarick will have a CT chest in 12/2015 and will see Dr. Isidore Moos to review the results and stability of these lymph nodes.  Mr. Genet is aware of these appointments and the reason for upcoming imaging.  Also, a comprehensive survivorship care plan and treatment summary was reviewed with the patient today detailing his head & neck cancer diagnosis, treatment course, potential late/long-term effects of treatment, appropriate follow-up care with  recommendations for the future, and patient education resources.  A copy of this summary, along with a letter will be sent to the patient's primary care provider via mail/fax/In Basket message after today's visit.  Mr. Wedig is welcome to return to the Survivorship Clinic in the future, as needed.  When he is approximately 2 years out from his diagnosis and treatment, he may be referred back to Survivorship for continued surveillance and maintenance until his 5 year "graduation" from follow-up at the cancer center.   Upcoming appointments:   Appointment Date & Time Who will patient  see?   CT chest 12/12/15  --  Radiation  Oncology 12/19/15 Dr. Isidore Moos  ENT In 09/2015; pt to call to make appt.  Dr. Janace Hoard  Dentist As directed Primary care dentist  Survivorship Nurse Practitioner (NP) As needed, until 2 years out from treatment. Mike Craze, NP  Dietitian As needed Dory Peru, RD  Speech Therapy 08/18/15 Garald Balding, SLP  Social Work As needed Polo Riley, LCSW  Physical Therapy As needed Serafina Royals, PT     2. Peripheral neuropathy secondary to chemotherapy: Peripheral neuropathy is a common complaint among cancer survivors who receive Platinum drugs, like Cisplatin.  Mr. Schewe states that the neuropathy symptoms are worse at night, but are not troublesome during the day.  I relayed that this symptom will hopefully be self-limiting over the next several months, as the peripheral nerve damage from chemotherapy continues to potentially heal, thus improving his symptoms with time.  Given that his symptoms are currently not severe and he states that they are manageable, it does not require further intervention at this time.  I encouraged him to let me know if his symptoms worsened, as there are medications that can help decrease neuropathy.  He agreed to this plan.   3. Nutritional status: Mr. Zidek reports that he is currently able to consume adequate nutrition by mouth.  His feeding tube and  port-a-cath were both removed on 07/17/15.  His weight is currently stable at 154 lbs. He was encouraged to continue to consume adequate hydration and nutrition, as tolerated. He will meet with Dory Peru, RD today, our oncology dietitian for follow-up as well during this Survivorship [place] visit today. Please see her documentation for additional details.  4. At risk for dysphagia: Given Mr. Loera treatment for BOT cancer, which included concurrent radiation/high-dose Cisplatin, he is at risk for chronic dysphagia.  He reports that he is largely able to consume all desired foods, as long as he has adequate hydration with his meals.  He states that he "drinks a lot of milk with meals because it helps with my dry mouth and swallowing."  I encouraged him to continue to perform the swallowing exercises, as directed by Garald Balding, SLP.  He has recently seen Glendell Docker on 07/14/15 and will see him for additional follow-up in November.  Overall, the patients reported very minor swallowing concerns are to be expected and stable.    5.  At risk for neck lymphedema:  When patients with head & neck cancers are treated with surgery and/or radiation therapy, there is an associated increased risk of neck lymphedema.  Mr. Swiatek reports that currently he is experiencing what would be considered mild symptoms. He does have a neck compression garment and reports wearing it, as directed.  I encouraged Mr. Pare to continue to wear the compression garment and practice the massage techniques to reduce the presence of lymphedema.  He is also currently being treated for lymphedema by our Outpatient Oncology Physical Therapy team.  I encouraged him to complete therapy and perform their recommendations for prevention of worsening lyphemedema.    6.  At risk for hypothyroidism: The thyroid gland is often affected after treatment for head & neck cancer.  Mr. Czarnecki most recent TSH was normal at 1.273 on 06/09/15.  He will continue to have  serial TSH monitoring for the next 5 years as part of his routine follow-up and post-cancer treatment care.   7. At risk for tooth decay/dental concerns: After treatment with radiation for head &  neck cancers, patients often experience xerostomia which increases their risk of dental caries. Mr. Ketchum was encouraged to see his dentist 3-4 times per year.  He should also continue to use his fluoride trays daily, as directed by dentistry.  I encouraged him to reach out to either Dr. Enrique Sack (oncology dentist) or his primary care dentist with additional questions or concerns.   8.  Lung cancer screening: Mr. Delauder currently does not meet criteria for lung cancer screening, as he has never smoked.    9. Tobacco & alcohol use: Mr. Yarbro has never smoked and continues to abstain from all tobacco products.  I congratulated his continued efforts to remain tobacco free.  I also reinforced the importance of avoiding alcohol consumption as well.  Both tobacco and alcohol use in patients with head & neck cancer increases the risk of recurrence.  They also increase the risk of other cancers, as well.  Mr. Dino states that he voiced understanding of the importance of continuing to remain both tobacco and alcohol-free.    10. Health maintenance and wellness promotion: Cancer patients who consume a diet rich in fruits and vegetables have better overall health and decreased risk of cancer recurrence. Mr. Arneson was encouraged to consume 5-7 servings of fruits and vegetables per day, as tolerated. We reviewed the "Nutrition Rainbow" handout. He was also encouraged to engage in moderate to vigorous exercise for 30 minutes per day most days of the week. We discussed the [employer] fitness program, which is designed for cancer survivors to help them become more physically fit after cancer treatments. He was given a Engineer, civil (consulting) with information on how to get enrolled.  He will think about this and contact them,  if interested.   11. Support services/counseling: It is not uncommon for this period of the patient's cancer care trajectory to be one of many emotions and stressors.  He actually completed the last series of the Head & Neck FYNN ("Finding Your New Normal") support group series, designed for patients after they have completed treatment.  He found the sessions helpful in sharing with other head & neck cancer survivors.  Mr. Dugger was encouraged to take advantage of our many other support services programs, support groups, and/or counseling in coping with his new life as a cancer survivor after completing anti-cancer treatment.  He was offered support today through active listening and expressive supportive counseling. He was given information regarding our available services and encouraged to contact me with any questions or for help enrolling in any of our support group/programs.  Our social work team will be available, as well, should Mr. Piccione experience distress or have social concerns in the future.    A total of 30 minutes of face-to-face time was spent with this patient with greater than 50% of that time in counseling and care-coordination.   Mike Craze, NP Survivorship Program [place] [phone]   Note: PRIMARY CARE PROVIDER Laurey Morale, MD [place] Fax: [phone]

## 2015-07-30 ENCOUNTER — Ambulatory Visit: Payer: Medicare Other | Admitting: Nutrition

## 2015-07-30 ENCOUNTER — Encounter: Payer: Self-pay | Admitting: Adult Health

## 2015-07-30 ENCOUNTER — Encounter: Payer: Self-pay | Admitting: *Deleted

## 2015-07-30 ENCOUNTER — Ambulatory Visit (HOSPITAL_BASED_OUTPATIENT_CLINIC_OR_DEPARTMENT_OTHER): Payer: Medicare Other | Admitting: Adult Health

## 2015-07-30 VITALS — BP 136/79 | HR 97 | Temp 97.3°F | Resp 14 | Wt 154.2 lb

## 2015-07-30 DIAGNOSIS — G620 Drug-induced polyneuropathy: Secondary | ICD-10-CM

## 2015-07-30 DIAGNOSIS — Z85810 Personal history of malignant neoplasm of tongue: Secondary | ICD-10-CM | POA: Diagnosis not present

## 2015-07-30 DIAGNOSIS — C01 Malignant neoplasm of base of tongue: Secondary | ICD-10-CM

## 2015-07-30 NOTE — Progress Notes (Signed)
Patient was seen at survivorship clinic for nutrition follow-up after completion of treatment for tongue cancer. Patient's weight has improved slightly and was documented as 154.2 pounds today. Feeding tube has been removed several weeks and patient is consuming a regular diet without difficulty. Patient reports his taste is within normal limits. Patient able to tolerate a variety of foods including porkchops and pecan pie. Patient admits to some painful swallowing with some coarse foods however he continues to consume a diet with increased variety Patient has no nutrition concerns at this time.  He would like his weight remains stable.  He has a healthy BMI of 23.45. Patient has had education on healthy diet for survivorship. He has my contact information for questions or concerns.

## 2015-07-31 ENCOUNTER — Encounter: Payer: Self-pay | Admitting: Cardiology

## 2015-07-31 ENCOUNTER — Ambulatory Visit (INDEPENDENT_AMBULATORY_CARE_PROVIDER_SITE_OTHER): Payer: Medicare Other | Admitting: Cardiology

## 2015-07-31 VITALS — BP 118/64 | HR 74 | Ht 69.0 in | Wt 153.9 lb

## 2015-07-31 DIAGNOSIS — I5022 Chronic systolic (congestive) heart failure: Secondary | ICD-10-CM | POA: Diagnosis not present

## 2015-07-31 DIAGNOSIS — I10 Essential (primary) hypertension: Secondary | ICD-10-CM

## 2015-07-31 MED ORDER — CARVEDILOL 25 MG PO TABS: 25.0000 mg | ORAL_TABLET | Freq: Two times a day (BID) | ORAL | Status: DC

## 2015-07-31 NOTE — Patient Instructions (Addendum)
Continue your current therapy  I will see you in one year   

## 2015-07-31 NOTE — Progress Notes (Signed)
  Oncology Nurse Navigator Documentation   Navigator Encounter Type: Clinic/MDC (07/30/15 1230)     Met briefly with patient prior to Survivorship appt with Mike Craze, NP.                Support Groups/Services: Other (07/30/15 1230)   Time Spent with Patient: 15 (07/30/15 1230)

## 2015-07-31 NOTE — Progress Notes (Signed)
Nicholas Wilkerson Date of Birth: [birth year]   History of Present Illness: Nicholas Wilkerson is seen for followup. He has a history of dilated cardiomyopathy with initial ejection fraction of 30%. Subsequent evaluation has demonstrated improvement to 50-55%. Last Echo was in May 2012. 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 He was treated with RT and chemo. Just recently had gastrostomy removed. Lost 34 lbs. Finally eating again. Played golf for the first time yesterday. Feeling well. No cardiac problems noted. Ramipril was stopped due to low BP.   Current Outpatient Prescriptions on File Prior to Visit  Medication Sig Dispense Refill  . aspirin EC 81 MG tablet Take 81 mg by mouth daily.    . sodium fluoride (FLUORISHIELD) 1.1 % GEL dental gel Instill one drop of gel per tooth space of fluoride tray. Place over teeth for 5 minutes. Remove. Spit out excess. Repeat nightly. 120 mL prn  . tadalafil (CIALIS) 20 MG tablet Take 1 tablet (20 mg total) by mouth daily as needed for erectile dysfunction. 30 tablet 3  . tamsulosin (FLOMAX) 0.4 MG CAPS capsule TAKE 1 CAPSULE BY MOUTH DAILY 30 capsule 1  . TRAVATAN Z 0.004 % SOLN ophthalmic solution Place 1 drop into both eyes at bedtime.     . traZODone (DESYREL) 50 MG tablet Take 1 tablet (50 mg total) by mouth at bedtime. 30 tablet 3   No current facility-administered medications on file prior to visit.    No Known Allergies  Past Medical History  Diagnosis Date  . Hypertension   . Dilated cardiomyopathy ([place])   . Hypercholesterolemia   . DIVERTICULOSIS, COLON 12/08/2007  . ARTHRITIS 10/22/2008  . History of echocardiogram 05/22/2007    EF was 45-50% / Mild concentric LV hypertrophy with mild global hypokinesis and overall mild systolic dysfunction .  Mild AV sclerosis / Mild Mitral insufficiency / compared to prior study 04/24/02 -- LV function has improved further.    . CHF (congestive heart failure) [facility])     sees  Dr. Jesiah Grismer [name]   . BPH (benign prostatic hyperplasia)   . Squamous cell carcinoma ([place]) 11/20/14    base of tongue primary  . Skin cancer     squamous cell, basal cell  . H/O asbestos exposure   . Glaucoma   . Radiation 01/01/15-02/18/15    base of tongue and bilateral neck 70 Gy  . Insomnia 06/11/2015    Past Surgical History  Procedure Laterality Date  . Cardiac catheterization  10/17/2001    EF estimated at 30% / moderate LV enlargement  / 1. Minimal nonobstructive atherosclerotic coronary artery disease / 2. Severe LV dysfunction with global hypokinesia consistent with dilated nonischemic cardiomyopath / 3. Moderate pulmonary hypertension  . Colonoscopy  08-15-07    per Dr. Carlean Purl, clear , repeat in 10 yrs  . Lymph node biopsy      History  Smoking status  . Never Smoker   Smokeless tobacco  . Never Used    History  Alcohol Use  . 0.0 oz/week  . 0 Standard drinks or equivalent per week    Comment: occl    Family History  Problem Relation Age of Onset  . Stroke Father   . Cancer Father     kidney ca  . Angina Mother     Review of Systems:  All other systems were reviewed and are negative.  Physical Exam: BP 118/64 mmHg  Pulse 74  Ht 5\' 9"  (1.753 m)  Wt 69.809 kg (153 lb 14.4 oz)  BMI 22.72 kg/m2 He is a pleasant white male in no acute distress. His HEENT exam is unremarkable. No JVD or bruits. Lungs are clear. Cardiac exam reveals a regular rate and rhythm without gallop or murmur. Normal S1-2. Abdomen soft and nontender. No masses or HSM.  He has no edema. Pedal pulses are good. Neuro exam is without focal findings.  LABORATORY DATA:  Lab Results  Component Value Date   WBC 4.8 07/17/2015   HGB 14.7 07/17/2015   HCT 45.6 07/17/2015   PLT 159 07/17/2015   GLUCOSE 113 06/09/2015   CHOL 145 09/09/2014   TRIG 183.0* 09/09/2014   HDL 31.90* 09/09/2014   LDLCALC 77 09/09/2014   ALT 26 06/09/2015   AST 19 06/09/2015   NA 139 06/09/2015   K 4.0  06/09/2015   CL 100* 04/14/2015   CREATININE 1.0 06/09/2015   BUN 20.9 06/09/2015   CO2 24 06/09/2015   TSH 1.273 06/09/2015   PSA 3.43 09/09/2014   INR 0.95 07/17/2015      Assessment / Plan:  1. Dilated cardiomyopathy with recovery of LV function to 50-55%. We will continue long-term carvedilol. Leave off ramipril due to low BP.  Follow up in one year.  2. Hypertension well controlled.  3. PVCs, asymptomatic. Continue carvedilol.   4. SSCA of the tongue. S/p RT and chemo.

## 2015-08-01 ENCOUNTER — Encounter: Payer: Self-pay | Admitting: *Deleted

## 2015-08-01 ENCOUNTER — Other Ambulatory Visit: Payer: Self-pay | Admitting: Hematology and Oncology

## 2015-08-11 ENCOUNTER — Encounter: Payer: Self-pay | Admitting: Adult Health

## 2015-08-11 NOTE — Progress Notes (Signed)
A birthday card was mailed to the patient today on behalf of the Survivorship Program at [facility].   Gretchen Dawson, NP Survivorship Program  [facility] [phone]  

## 2015-08-18 ENCOUNTER — Ambulatory Visit: Payer: Medicare Other | Attending: Radiation Oncology

## 2015-08-18 DIAGNOSIS — R1310 Dysphagia, unspecified: Secondary | ICD-10-CM | POA: Diagnosis not present

## 2015-08-18 NOTE — Patient Instructions (Addendum)
Continue with exercises 2-3 times per day until Christmastime, then do thenm 2-3 times per week after that.  Advance your diet as you find it fits you. Things will likely be easier to swallow, especially drier foods, as you continue to do the exercises. =========================================    Signs of Aspiration Pneumonia   . Chest pain/tightness . Fever (can be low grade) . Cough  o With foul-smelling phlegm (sputum) o With sputum containing pus or blood o With greenish sputum . Fatigue  . Shortness of breath  . Wheezing   **IF YOU HAVE THESE SIGNS, CONTACT YOUR DOCTOR OR GO TO THE EMERGENCY DEPARTMENT OR URGENT CARE AS SOON AS POSSIBLE**

## 2015-08-18 NOTE — Therapy (Signed)
[facility] [address] [place], [place], [address] Phone: [phone]   Fax:  [phone]  Speech Language Pathology Treatment  Patient Details  Name: Nicholas Wilkerson MRN: [id number] Date of Birth: 05-22-1941 No Data Recorded  Encounter Date: 08/18/2015      End of Session - 08/18/15 1150    Visit Number 7   Date for SLP Re-Evaluation 07/16/15   SLP Start Time 1107   SLP Stop Time  1130   SLP Time Calculation (min) 23 min   Activity Tolerance Patient tolerated treatment well      Past Medical History  Diagnosis Date  . Hypertension   . Dilated cardiomyopathy ([place])   . Hypercholesterolemia   . DIVERTICULOSIS, COLON 12/08/2007  . ARTHRITIS 10/22/2008  . History of echocardiogram 05/22/2007    EF was 45-50% / Mild concentric LV hypertrophy with mild global hypokinesis and overall mild systolic dysfunction .  Mild AV sclerosis / Mild Mitral insufficiency / compared to prior study 04/24/02 -- LV function has improved further.    . CHF (congestive heart failure) [facility])     sees Dr. Peter [name]   . BPH (benign prostatic hyperplasia)   . Squamous cell carcinoma ([place]) 11/20/14    base of tongue primary  . Skin cancer     squamous cell, basal cell  . H/O asbestos exposure   . Glaucoma   . Radiation 01/01/15-02/18/15    base of tongue and bilateral neck 70 Gy  . Insomnia 06/11/2015    Past Surgical History  Procedure Laterality Date  . Cardiac catheterization  10/17/2001    EF estimated at 30% / moderate LV enlargement  / 1. Minimal nonobstructive atherosclerotic coronary artery disease / 2. Severe LV dysfunction with global hypokinesia consistent with dilated nonischemic cardiomyopath / 3. Moderate pulmonary hypertension  . Colonoscopy  08-15-07    per Dr. Carlean Purl, clear , repeat in 10 yrs  . Lymph node biopsy      There were no vitals filed for this visit.  Visit Diagnosis: Dysphagia      Subjective  Assessment - 08/18/15 1111    Subjective "You won't believe what I'm eating now!"               ADULT SLP TREATMENT - 08/18/15 1111    General Information   Behavior/Cognition Alert;Cooperative;Pleasant mood   Treatment Provided   Treatment provided Dysphagia   Dysphagia Treatment   Temperature Spikes Noted No   Treatment Methods Skilled observation;Therapeutic exercise   Patient observed directly with PO's Yes   Type of PO's observed Regular;Thin liquids  water sips necessary to clear pharyngeally   Liquids provided via --  bottle   Other treatment/comments Pt ate cornbread muffins, chicken breast, sweet potatoes in the last week, without reported issues with pharyngeal clearance. Pt ate peanut butter crackers and water today without overt s/s aspiration. With HEP, pt demo'd inependence with all exercises.    Pain Assessment   Pain Assessment No/denies pain   Assessment / Recommendations / Plan   Plan Discharge SLP treatment due to (comment)  Pt with essentially swalowing [facility])   Dysphagia Recommendations   Diet recommendations Thin liquid;Regular  as tolerated   Liquids provided via Cup   Compensations Follow solids with liquid;Effortful swallow   Progression Toward Goals   Progression toward goals Goals met, education completed, patient discharged from SLP          SLP Education - 08/18/15 1149  Education provided Yes   Education Details frequency of HEP   Person(s) Educated Patient   Methods Explanation   Comprehension Verbalized understanding          SLP Short Term Goals - 05/16/15 1046    SLP SHORT TERM GOAL #1   Title pt will complete HEP with rare min A   Time 1   Period --  visit   Status Achieved   SLP SHORT TERM GOAL #2   Title pt will tell SLP why he is completing HEP   Time 1   Period --  visit   Status Achieved          SLP Long Term Goals - 08/22/2015 1152    SLP LONG TERM GOAL #1   Title pt will complete HEP with modified  independence over two sessions   Status Partially Met  goal not met 03-13-15   SLP LONG TERM GOAL #2   Title pt will tell SLP three signs/symptoms aspiratiion PNA with modified independence   Status Achieved  goal not met 03-13-15   SLP LONG TERM GOAL #3   Title pt to tell SLP how a food journal can assist in return to [place] #4   Title pt will maintain ability to perform HEP with modified independence over a period of two months   Time 2   Period Months   Status Achieved  enforced for today (08-22-15)- and met today   SLP LONG TERM GOAL #5   Title pt will maintain swallowing safety while consuming POs with copmensations PRN, during ST sessions   Status Achieved          Plan - 08/22/15 1150    Clinical Impression Statement Pt appropriate for d/c today. See d/c-renewal summary.   Speech Therapy Frequency Monthly   Duration One additional visit   Treatment/Interventions Pharyngeal strengthening exercises;Oral motor exercises;Compensatory strategies;Patient/family education;SLP instruction and feedback   Potential to Achieve Goals Good   Consulted and Agree with Plan of Care Patient          G-Codes - 08-22-15 1152    Functional Assessment Tool Used noms   Functional Limitations Swallowing   Swallow Goal Status ([id number]) At least 1 percent but less than 20 percent impaired, limited or restricted   Swallow Discharge Status [phone]) At least 1 percent but less than 20 percent impaired, limited or restricted      [place]  Visits from Start of Care: 7  Current functional level related to goals / functional outcomes: Pt met or partially met all long term goals - see above for details. Pt req'd one more visit to see if he could maintain HEP independence/modified independence over time, which he did.  Pt is now eating mostly a regular diet with modifications of frequent liquid intake during meals (especially with drier  foods), but has had no overt s/s aspiration PNA reported today, nor any observed today. Pt ate peanut butter crackers and drank H2O in the session today without overt s/s aspiration.    Remaining deficits: Suspected minimal pharyngeal dysphagia, due to reported mild difficulty with pharyngeal clearance with drier foods.   Education / Equipment: HEP, aspiration PNA s/s, assistance with getting back to POs with food journal.  Plan: Patient agrees to discharge.  Patient goals were met. Patient is being discharged due to meeting the stated rehab goals.  ?????       Problem List  Patient Active Problem List   Diagnosis Date Noted  . Insomnia 06/11/2015  . Skin infection at gastrostomy tube site ([place]) 06/09/2015  . Protein calorie malnutrition ([place]) 03/24/2015  . Depression, acute 03/24/2015  . Dehydration 01/24/2015  . Palpitations 01/24/2015  . Leukopenia due to antineoplastic chemotherapy 01/21/2015  . Mucositis due to chemotherapy 01/14/2015  . Chemotherapy induced nausea and vomiting 01/07/2015  . Weight loss 01/07/2015  . History of skin cancer 12/03/2014  . Cancer of base of tongue ([place]) 12/02/2014  . Chronic systolic CHF (congestive heart failure) ([place]) 07/12/2013  . MUSCLE STRAIN, ABDOMINAL WALL 11/28/2008  . ARTHRITIS 10/22/2008  . BURSITIS, LEFT SHOULDER 07/24/2008  . INTERNAL HEMORRHOIDS 12/08/2007  . EXTERNAL HEMORRHOIDS 12/08/2007  . DIVERTICULOSIS, COLON 12/08/2007  . RECTAL BLEEDING 12/08/2007  . CONGESTIVE HEART FAILURE, HX OF 12/08/2007  . Congestive dilated cardiomyopathy ([place]) 06/13/2007  . HX, PERSONAL, ARTHRITIS 06/13/2007  . Hypercholesterolemia 05/15/2007  . Essential hypertension 05/15/2007    [facility] 08/18/2015, 11:53 AM  [facility] [address] [place], [place], [address] Phone: [phone]   Fax:  [phone]   Name: Nicholas Wilkerson MRN: [id number] Date of Birth:  1941/09/12

## 2015-09-01 ENCOUNTER — Other Ambulatory Visit: Payer: Self-pay | Admitting: Family Medicine

## 2015-09-02 ENCOUNTER — Telehealth: Payer: Self-pay | Admitting: *Deleted

## 2015-09-02 NOTE — Telephone Encounter (Signed)
  Oncology Nurse Navigator Documentation   Navigator Encounter Type: Telephone;6 month (09/02/15 1516) Patient Visit Type: Follow-up (09/02/15 1516)     Placed 23-monthpost-tmt follow-up call to check on patient's well-being.  He reported:  "Feel like I did when I was 52"  Eating and drinking everything he wants.  Sense of taste almost completely returned.  Energy level at baseline.  Activity level "better than ever".  Maintaining weight at around 153 lbs  Met with SLP CGarald Baldingrecently, has been released.  He continues with swallowing exercises daily, including tongue depressors. He understands his next appt with Dr. SIsidore Moosis in March 2017. He understands he can contact me with needs/concerns.  RGayleen Orem RN, BSN, [phone]                  Time Spent with Patient: 15 (09/02/15 1516)

## 2015-09-22 ENCOUNTER — Ambulatory Visit (INDEPENDENT_AMBULATORY_CARE_PROVIDER_SITE_OTHER): Payer: Medicare Other | Admitting: Family Medicine

## 2015-09-22 ENCOUNTER — Ambulatory Visit (INDEPENDENT_AMBULATORY_CARE_PROVIDER_SITE_OTHER)
Admission: RE | Admit: 2015-09-22 | Discharge: 2015-09-22 | Disposition: A | Discharge status: Home / Self Care | Payer: Medicare Other | Source: Ambulatory Visit | Attending: Family Medicine | Admitting: Family Medicine

## 2015-09-22 ENCOUNTER — Encounter: Payer: Self-pay | Admitting: Family Medicine

## 2015-09-22 VITALS — BP 122/69 | HR 66 | Temp 98.4°F | Ht 69.0 in | Wt 156.0 lb

## 2015-09-22 DIAGNOSIS — I5022 Chronic systolic (congestive) heart failure: Secondary | ICD-10-CM

## 2015-09-22 DIAGNOSIS — I10 Essential (primary) hypertension: Secondary | ICD-10-CM | POA: Diagnosis not present

## 2015-09-22 DIAGNOSIS — Z0000 Encounter for general adult medical examination without abnormal findings: Secondary | ICD-10-CM | POA: Diagnosis not present

## 2015-09-22 DIAGNOSIS — Z125 Encounter for screening for malignant neoplasm of prostate: Secondary | ICD-10-CM | POA: Diagnosis not present

## 2015-09-22 LAB — CBC WITH DIFFERENTIAL/PLATELET
BASOS ABS: 0 10*3/uL (ref 0.0–0.1)
Basophils Relative: 0.4 % (ref 0.0–3.0)
EOS ABS: 0.1 10*3/uL (ref 0.0–0.7)
Eosinophils Relative: 1.1 % (ref 0.0–5.0)
HEMATOCRIT: 47.4 % (ref 39.0–52.0)
HEMOGLOBIN: 15.4 g/dL (ref 13.0–17.0)
LYMPHS PCT: 12.8 % (ref 12.0–46.0)
Lymphs Abs: 0.7 10*3/uL (ref 0.7–4.0)
MCHC: 32.4 g/dL (ref 30.0–36.0)
MCV: 90.5 fl (ref 78.0–100.0)
Monocytes Absolute: 0.4 10*3/uL (ref 0.1–1.0)
Monocytes Relative: 7.7 % (ref 3.0–12.0)
NEUTROS ABS: 4.3 10*3/uL (ref 1.4–7.7)
Neutrophils Relative %: 78 % — ABNORMAL HIGH (ref 43.0–77.0)
PLATELETS: 218 10*3/uL (ref 150.0–400.0)
RBC: 5.24 Mil/uL (ref 4.22–5.81)
RDW: 15.1 % (ref 11.5–15.5)
WBC: 5.5 10*3/uL (ref 4.0–10.5)

## 2015-09-22 LAB — LIPID PANEL
Cholesterol: 200 mg/dL (ref 0–200)
HDL: 40.4 mg/dL (ref >39.00)
LDL Cholesterol: 134 mg/dL — ABNORMAL HIGH (ref 0–99)
NONHDL: 159.26
Total CHOL/HDL Ratio: 5
Triglycerides: 126 mg/dL (ref 0.0–149.0)
VLDL: 25.2 mg/dL (ref 0.0–40.0)

## 2015-09-22 LAB — POCT URINALYSIS DIPSTICK
Bilirubin, UA: NEGATIVE
Blood, UA: NEGATIVE
Glucose, UA: NEGATIVE
Ketones, UA: NEGATIVE
Leukocytes, UA: NEGATIVE
NITRITE UA: NEGATIVE
PH UA: 5.5
UROBILINOGEN UA: 0.2

## 2015-09-22 LAB — TSH: TSH: 3.49 u[IU]/mL (ref 0.35–4.50)

## 2015-09-22 LAB — HEPATIC FUNCTION PANEL
ALT: 12 U/L (ref 0–53)
AST: 13 U/L (ref 0–37)
Albumin: 4.1 g/dL (ref 3.5–5.2)
Alkaline Phosphatase: 82 U/L (ref 39–117)
BILIRUBIN DIRECT: 0.1 mg/dL (ref 0.0–0.3)
BILIRUBIN TOTAL: 0.5 mg/dL (ref 0.2–1.2)
Total Protein: 6.3 g/dL (ref 6.0–8.3)

## 2015-09-22 LAB — BASIC METABOLIC PANEL
BUN: 18 mg/dL (ref 6–23)
CALCIUM: 9.4 mg/dL (ref 8.4–10.5)
CO2: 32 meq/L (ref 19–32)
Chloride: 106 mEq/L (ref 96–112)
Creatinine, Ser: 1.13 mg/dL (ref 0.40–1.50)
GFR: 67.79 mL/min (ref >60.00)
Glucose, Bld: 110 mg/dL — ABNORMAL HIGH (ref 70–99)
POTASSIUM: 4.6 meq/L (ref 3.5–5.1)
SODIUM: 143 meq/L (ref 135–145)

## 2015-09-22 LAB — PSA: PSA: 3.23 ng/mL (ref 0.10–4.00)

## 2015-09-22 MED ORDER — TAMSULOSIN HCL 0.4 MG PO CAPS: 0.4000 mg | ORAL_CAPSULE | Freq: Every day | ORAL | Status: DC

## 2015-09-22 MED ORDER — DICLOFENAC SODIUM 75 MG PO TBEC: 75.0000 mg | DELAYED_RELEASE_TABLET | Freq: Two times a day (BID) | ORAL | Status: DC

## 2015-09-22 MED ORDER — TRAZODONE HCL 50 MG PO TABS: 50.0000 mg | ORAL_TABLET | Freq: Every day | ORAL | Status: DC

## 2015-09-22 NOTE — Progress Notes (Signed)
   Subjective:    Patient ID: Nicholas Wilkerson, male    DOB: May 16, 1941, 74 y.o.   MRN: [id number]  HPI 74 yr old male for a cpx. He feels well and has no concerns. He has progressed very well from his treatments for oral cancer earlier this year. He is eating and drinking whatever he wants. His appetite is good and he has gained a little weight.    Review of Systems  Constitutional: Negative.   HENT: Negative.   Eyes: Negative.   Respiratory: Negative.   Cardiovascular: Negative.   Gastrointestinal: Negative.   Genitourinary: Negative.   Musculoskeletal: Negative.   Skin: Negative.   Neurological: Negative.   Psychiatric/Behavioral: Negative.        Objective:   Physical Exam  Constitutional: He is oriented to person, place, and time. He appears well-developed and well-nourished. No distress.  HENT:  Head: Normocephalic and atraumatic.  Right Ear: External ear normal.  Left Ear: External ear normal.  Nose: Nose normal.  Mouth/Throat: Oropharynx is clear and moist. No oropharyngeal exudate.  Eyes: Conjunctivae and EOM are normal. Pupils are equal, round, and reactive to light. Right eye exhibits no discharge. Left eye exhibits no discharge. No scleral icterus.  Neck: Neck supple. No JVD present. No tracheal deviation present. No thyromegaly present.  Cardiovascular: Normal rate, regular rhythm, normal heart sounds and intact distal pulses.  Exam reveals no gallop and no friction rub.   No murmur heard. Pulmonary/Chest: Effort normal and breath sounds normal. No respiratory distress. He has no wheezes. He exhibits no tenderness.  Fine diffuse crackles heard   Abdominal: Soft. Bowel sounds are normal. He exhibits no distension and no mass. There is no tenderness. There is no rebound and no guarding.  Genitourinary: Rectum normal, prostate normal and penis normal. Guaiac negative stool. No penile tenderness.  Musculoskeletal: Normal range of motion. He exhibits no edema or  tenderness.  Lymphadenopathy:    He has no cervical adenopathy.  Neurological: He is alert and oriented to person, place, and time. He has normal reflexes. No cranial nerve deficit. He exhibits normal muscle tone. Coordination normal.  Skin: Skin is warm and dry. No rash noted. He is not diaphoretic. No erythema. No pallor.  Psychiatric: He has a normal mood and affect. His behavior is normal. Judgment and thought content normal.          Assessment & Plan:  Well exam. Get fasting labs. We discussed diet and exercise. He has not had a CXR in years so we will set one up today

## 2015-09-22 NOTE — Progress Notes (Signed)
Pre visit review using our clinic review tool, if applicable. No additional management support is needed unless otherwise documented below in the visit note. 

## 2015-11-03 MED FILL — MIRTAZAPINE 15 MG TABLET: 15 | 30 days supply | Qty: 30 | Fill #3

## 2015-12-03 MED FILL — FLUORISHIELD 1.1% GEL: 1.1 % | 10 days supply | Qty: 114 | Fill #2

## 2015-12-12 ENCOUNTER — Ambulatory Visit (HOSPITAL_COMMUNITY)
Admission: RE | Admit: 2015-12-12 | Discharge: 2015-12-12 | Disposition: A | Discharge status: Home / Self Care | Payer: Medicare HMO | Source: Ambulatory Visit | Attending: Radiation Oncology | Admitting: Radiation Oncology

## 2015-12-12 ENCOUNTER — Ambulatory Visit (HOSPITAL_BASED_OUTPATIENT_CLINIC_OR_DEPARTMENT_OTHER)
Admission: RE | Admit: 2015-12-12 | Discharge: 2015-12-12 | Disposition: A | Discharge status: Home / Self Care | Payer: Medicare HMO | Source: Ambulatory Visit | Attending: Radiation Oncology | Admitting: Radiation Oncology

## 2015-12-12 DIAGNOSIS — R110 Nausea: Secondary | ICD-10-CM

## 2015-12-12 DIAGNOSIS — C01 Malignant neoplasm of base of tongue: Secondary | ICD-10-CM | POA: Diagnosis not present

## 2015-12-12 DIAGNOSIS — K769 Liver disease, unspecified: Secondary | ICD-10-CM | POA: Diagnosis not present

## 2015-12-12 DIAGNOSIS — R911 Solitary pulmonary nodule: Secondary | ICD-10-CM | POA: Insufficient documentation

## 2015-12-12 DIAGNOSIS — R634 Abnormal weight loss: Secondary | ICD-10-CM

## 2015-12-12 LAB — COMPREHENSIVE METABOLIC PANEL
ALBUMIN: 3.8 g/dL (ref 3.5–5.0)
ALT: 19 U/L (ref 0–55)
ANION GAP: 7 meq/L (ref 3–11)
AST: 17 U/L (ref 5–34)
Alkaline Phosphatase: 96 U/L (ref 40–150)
BUN: 16.7 mg/dL (ref 7.0–26.0)
CHLORIDE: 106 meq/L (ref 98–109)
CO2: 28 meq/L (ref 22–29)
CREATININE: 1.3 mg/dL (ref 0.7–1.3)
Calcium: 9.1 mg/dL (ref 8.4–10.4)
EGFR: 54 mL/min/{1.73_m2} — ABNORMAL LOW (ref >90)
GLUCOSE: 103 mg/dL (ref 70–140)
POTASSIUM: 4.7 meq/L (ref 3.5–5.1)
Sodium: 141 mEq/L (ref 136–145)
TOTAL PROTEIN: 6.8 g/dL (ref 6.4–8.3)
Total Bilirubin: 0.46 mg/dL (ref 0.20–1.20)

## 2015-12-12 LAB — CBC WITH DIFFERENTIAL/PLATELET
BASO%: 0.7 % (ref 0.0–2.0)
BASOS ABS: 0 10*3/uL (ref 0.0–0.1)
EOS ABS: 0.2 10*3/uL (ref 0.0–0.5)
EOS%: 3.8 % (ref 0.0–7.0)
HCT: 47.2 % (ref 38.4–49.9)
HEMOGLOBIN: 15.2 g/dL (ref 13.0–17.1)
LYMPH#: 0.7 10*3/uL — AB (ref 0.9–3.3)
LYMPH%: 16.8 % (ref 14.0–49.0)
MCH: 28.7 pg (ref 27.2–33.4)
MCHC: 32.1 g/dL (ref 32.0–36.0)
MCV: 89.4 fL (ref 79.3–98.0)
MONO#: 0.4 10*3/uL (ref 0.1–0.9)
MONO%: 10.1 % (ref 0.0–14.0)
NEUT%: 68.6 % (ref 39.0–75.0)
NEUTROS ABS: 3 10*3/uL (ref 1.5–6.5)
PLATELETS: 186 10*3/uL (ref 140–400)
RBC: 5.28 10*6/uL (ref 4.20–5.82)
RDW: 15.4 % — AB (ref 11.0–14.6)
WBC: 4.4 10*3/uL (ref 4.0–10.3)

## 2015-12-12 LAB — MAGNESIUM: Magnesium: 2.6 mg/dl — ABNORMAL HIGH (ref 1.5–2.5)

## 2015-12-12 LAB — TSH: TSH: 2.847 m[IU]/L (ref 0.320–4.118)

## 2015-12-12 MED ORDER — IOHEXOL 300 MG/ML  SOLN
100.0000 mL | Freq: Once | INTRAMUSCULAR | Status: AC | PRN
  Administered 2015-12-12: 75 mL via INTRAVENOUS

## 2015-12-15 DIAGNOSIS — Z85810 Personal history of malignant neoplasm of tongue: Secondary | ICD-10-CM | POA: Insufficient documentation

## 2015-12-16 NOTE — Progress Notes (Signed)
Mr. Roper is here for follow up of radiation completed 02/18/15 to his Base of Tongue and Bilateral Neck.   Pain issues, if any: None Using a feeding tube?: No Weight changes, if any: He reports remaining at a constant weight.  Wt Readings from Last 3 Encounters:  12/19/15 165 lb 4.8 oz (74.98 kg)  09/22/15 156 lb (70.761 kg)  07/31/15 153 lb 14.4 oz (69.809 kg)   Swallowing issues, if any: He reports no difficulty swallowing, but does report a dry mouth. He does have difficulty with some meats which require saliva to swallow.  Smoking or chewing tobacco? No Using fluoride trays daily? He is using the trays at night time daily.  Last ENT visit was on: Dr. Janace Hoard on March 02/2016. He performed a scope at this time and reports no problems.  Other notable issues, if any: None   BP 128/95 mmHg  Pulse 73  Temp(Src) 98.1 F (36.7 C)  Ht 5\' 9"  (1.753 m)  Wt 165 lb 4.8 oz (74.98 kg)  BMI 24.40 kg/m2  SpO2 99%

## 2015-12-19 ENCOUNTER — Ambulatory Visit
Admission: RE | Admit: 2015-12-19 | Discharge: 2015-12-19 | Disposition: A | Discharge status: Home / Self Care | Payer: Medicare Other | Source: Ambulatory Visit | Attending: Radiation Oncology | Admitting: Radiation Oncology

## 2015-12-19 ENCOUNTER — Encounter: Payer: Self-pay | Admitting: Radiation Oncology

## 2015-12-19 VITALS — BP 128/95 | HR 73 | Temp 98.1°F | Ht 69.0 in | Wt 165.3 lb

## 2015-12-19 DIAGNOSIS — C01 Malignant neoplasm of base of tongue: Secondary | ICD-10-CM

## 2015-12-19 NOTE — Progress Notes (Signed)
Radiation Oncology         ([phone]) [phone] ________________________________  Name: Nicholas Wilkerson MRN: [id number]  Date: 12/19/2015  DOB: [birth year]  Follow-Up Visit Note  CC: Nicholas Morale, MD  Nicholas Montane, MD  Diagnosis and Prior Radiotherapy:       ICD-9-CM ICD-10-CM   1. Cancer of base of tongue ([place]) 141.0 C01    T2N2aM0 Stage IVa Right Base of Tongue Squamous Cell Carcinoma    Indication for treatment:  Curative with chemotherapy        Radiation treatment dates:   01/01/2015-02/18/2015 (70 Gy in 35 fractions)    Narrative: Nicholas Wilkerson is a 75 year old male presenting to clinic in regards to the management of his cancer of the tongue. I personally reviewed CT of chest from 12/12/2015. This looks stable without evidence of disease. He does have some small nodules that warrant continued follow up according to radiology but are felt to be benign.   Pain issues, if any: None  Using a feeding tube?: No  Weight changes, if any: He reports remaining at a constant weight.  Swallowing issues, if any: He reports no difficulty swallowing, but does report a dry mouth. He does have difficulty with some meats which require saliva to swallow.  Smoking or chewing tobacco? No  Using fluoride trays daily? He is using the trays at night time daily.  Last ENT visit was on: Dr. Janace Hoard on 12/14/2015. He performed a scope at this time and reports no problems.  Other notable issues, if any: None    ALLERGIES:  has No Known Allergies.  Meds: Current Outpatient Prescriptions  Medication Sig Dispense Refill  . aspirin EC 81 MG tablet Take 81 mg by mouth daily.    . carvedilol (COREG) 25 MG tablet Take 1 tablet (25 mg total) by mouth 2 (two) times daily with a meal. 180 tablet 3  . diclofenac (VOLTAREN) 75 MG EC tablet Take 1 tablet (75 mg total) by mouth 2 (two) times daily. 180 tablet 3  . sodium fluoride (FLUORISHIELD) 1.1 % GEL dental gel Instill one drop of gel per tooth space of fluoride  tray. Place over teeth for 5 minutes. Remove. Spit out excess. Repeat nightly. 120 mL prn  . tamsulosin (FLOMAX) 0.4 MG CAPS capsule Take 1 capsule (0.4 mg total) by mouth daily. 30 capsule 11  . TRAVATAN Z 0.004 % SOLN ophthalmic solution Place 1 drop into both eyes at bedtime.     . traZODone (DESYREL) 50 MG tablet Take 1 tablet (50 mg total) by mouth at bedtime. 90 tablet 3  . tadalafil (CIALIS) 20 MG tablet Take 1 tablet (20 mg total) by mouth daily as needed for erectile dysfunction. (Patient not taking: Reported on 12/19/2015) 30 tablet 3   No current facility-administered medications for this encounter.    Physical Findings: The patient is in no acute distress. Patient is alert and oriented. Wt Readings from Last 3 Encounters:  12/19/15 165 lb 4.8 oz (74.98 kg)  09/22/15 156 lb (70.761 kg)  07/31/15 153 lb 14.4 oz (69.809 kg)    height is 5\' 9"  (1.753 m) and weight is 165 lb 4.8 oz (74.98 kg). His temperature is 98.1 F (36.7 C). His blood pressure is 128/95 and his pulse is 73. His oxygen saturation is 99%.    General: Alert and oriented, in no acute distress HEENT: Head is normocephalic. Extraocular movements are intact. No lesions noted in the oropharynx. Neck: Neck is supple, no palpable  cervical or supraclavicular lymphadenopathy. Very mild lymphedema in anterior neck but no palpable masses. Heart: Regular in rate and rhythm with no murmurs, rubs, or gallops. Chest: Clear to auscultation bilaterally, with no rhonchi, wheezes, or rales. Abdomen: soft, non-tender, non-distended Extremities: No cyanosis or edema. Lymphatics: see Neck Exam Psychiatric: Judgment and insight are intact. Affect is appropriate.   Lab Findings: Lab Results  Component Value Date   WBC 4.4 12/12/2015   HGB 15.2 12/12/2015   HCT 47.2 12/12/2015   MCV 89.4 12/12/2015   PLT 186 12/12/2015    Lab Results  Component Value Date   TSH 2.847 12/12/2015    Radiographic Findings: Ct Chest W  Contrast  12/12/2015  CLINICAL DATA:  Tongue cancer. Mediastinal lymph nodes seen on previous PET-CT. EXAM: CT CHEST WITH CONTRAST TECHNIQUE: Multidetector CT imaging of the chest was performed during intravenous contrast administration. CONTRAST:  58mL OMNIPAQUE IOHEXOL 300 MG/ML  SOLN COMPARISON:  PET-CT from 06/17/2015. FINDINGS: Mediastinum / Lymph Nodes: There is no axillary lymphadenopathy. Calcified lymph nodes are seen in the mediastinum. No pathologically enlarged mediastinal lymph nodes are evident. 7 mm short axis subcarinal lymph node is unchanged. 7 mm short axis right hilar lymph node measured on the previous PET-CT is stable at 7 mm short axis. There is no hilar lymphadenopathy. The heart size is normal. No pericardial effusion. Coronary artery calcification is noted. The esophagus has normal imaging features. Lungs / Pleura: No focal airspace consolidation. No pulmonary edema or pleural effusion. No suspicious pulmonary nodule or mass. 3 mm nodule along the left major fissure (image 25 series 5) is stable since the previous PET-CT and also a study from 12/10/2014. Upper Abdomen: No change 14 mm low-density lesion medial segment left liver with other scattered small hypodensities in the liver parenchyma. MSK / Soft Tissues: Bone windows reveal no worrisome lytic or sclerotic osseous lesions. IMPRESSION: 1. Stable mediastinal and right hilar lymph nodes. 2. Stable low-density liver lesions. 3. 3 mm nodule along the left major fissure is unchanged back to 12/10/2014. This is almost assuredly benign, but continued attention on follow-up recommended. Electronically Signed   By: Misty Stanley M.D.   On: 12/12/2015 13:55    Impression/Plan:  Patient will continue to see Dr. Janace Hoard every 3-4 months. He will see Elzie Rings in survivorship in 6 months. We will follow up with another CT chest scan in 6 months.   1) Head and Neck Cancer Status: NED; small nodules are noted but believed to be benign  2)  Nutritional Status: no major complaints  3) Risk Factors: The patient has been previously educated about risk factors including alcohol and tobacco abuse; he understands that avoidance of alcohol and tobacco is important to prevent recurrences as well as other cancers  4) Swallowing: no major complaints  5) Dental: He is compliant with fluoride rinses. He is still seeing his dentist and has an appointment this month.   6) Thyroid function: good Lab Results  Component Value Date   TSH 2.847 12/12/2015     7) Social: He is supported by his wife.   8) Other:  I will see him roughly 6-8 mo after he sees Mike Craze , NP as above _____________________________________   Eppie Gibson, MD   This document serves as a record of services personally performed by Eppie Gibson, MD. It was created on her behalf by Jenell Milliner, a trained medical scribe. The creation of this record is based on the scribe's personal observations and the provider's  statements to them. This document has been checked and approved by the attending provider

## 2015-12-30 ENCOUNTER — Telehealth: Payer: Self-pay | Admitting: Adult Health

## 2015-12-30 ENCOUNTER — Other Ambulatory Visit: Payer: Self-pay | Admitting: Adult Health

## 2015-12-30 DIAGNOSIS — C01 Malignant neoplasm of base of tongue: Secondary | ICD-10-CM

## 2015-12-30 DIAGNOSIS — R918 Other nonspecific abnormal finding of lung field: Secondary | ICD-10-CM

## 2015-12-30 NOTE — Telephone Encounter (Signed)
I spoke with Mr. Malott who is doing very well.  He reports that he just finished playing golf today and has had tons of energy.  "I feel as good as I did when I was 75 years old" which is wonderful news for his quality of life.  He does not have any vacations planned for September and prefers Monday or Tuesday appts.   I let him know that Dr. Isidore Moos would like to come back to see me sometime in September with CT chest to follow-up on likely benign lung nodules, TSH, and visit with me.  I let him know that I would work with Enid Derry on getting his scan/lab scheduled, then I would mail him an appt calendar as a reminder.   He prefers Monday or Tuesday appts.  Per Dr. Isidore Moos, he will get labs/scan first thing in the morning and then return the same day in the afternoon to see me and get the results of his scan.  I encouraged Mr. Deluke to call me with any questions or concerns.   Mike Craze, NP [place] [phone]

## 2015-12-31 ENCOUNTER — Encounter: Payer: Self-pay | Admitting: Adult Health

## 2015-12-31 NOTE — Progress Notes (Signed)
Appts for 06/2016 labs, CT chest, and f/u visit with me have been made.  I have mailed a copy of his appt calendar, along with a letter detailing his appts and encouraging him to call me with any questions or concerns.  I look forward to seeing him in September!  Nicholas Craze, NP [place] [phone]

## 2016-06-11 ENCOUNTER — Ambulatory Visit: Payer: Self-pay | Admitting: Radiation Oncology

## 2016-06-11 ENCOUNTER — Inpatient Hospital Stay: Admission: RE | Admit: 2016-06-11 | Payer: Medicare HMO | Source: Ambulatory Visit

## 2016-06-11 ENCOUNTER — Ambulatory Visit (HOSPITAL_COMMUNITY): Payer: Medicare HMO

## 2016-06-11 ENCOUNTER — Ambulatory Visit: Payer: Medicare HMO

## 2016-06-11 ENCOUNTER — Encounter: Payer: Self-pay | Admitting: Adult Health

## 2016-06-21 ENCOUNTER — Ambulatory Visit
Admission: RE | Admit: 2016-06-21 | Discharge: 2016-06-21 | Disposition: A | Discharge status: Home / Self Care | Payer: Medicare HMO | Source: Ambulatory Visit | Attending: Radiation Oncology | Admitting: Radiation Oncology

## 2016-06-21 ENCOUNTER — Ambulatory Visit (HOSPITAL_BASED_OUTPATIENT_CLINIC_OR_DEPARTMENT_OTHER): Payer: Medicare HMO | Admitting: Adult Health

## 2016-06-21 ENCOUNTER — Encounter: Payer: Self-pay | Admitting: Adult Health

## 2016-06-21 ENCOUNTER — Encounter (HOSPITAL_COMMUNITY): Payer: Self-pay

## 2016-06-21 ENCOUNTER — Ambulatory Visit (HOSPITAL_COMMUNITY)
Admission: RE | Admit: 2016-06-21 | Discharge: 2016-06-21 | Disposition: A | Discharge status: Home / Self Care | Payer: Medicare HMO | Source: Ambulatory Visit | Attending: Adult Health | Admitting: Adult Health

## 2016-06-21 ENCOUNTER — Other Ambulatory Visit: Payer: Self-pay | Admitting: Adult Health

## 2016-06-21 VITALS — BP 144/77 | HR 72 | Temp 98.3°F | Resp 18 | Wt 163.3 lb

## 2016-06-21 DIAGNOSIS — Z85810 Personal history of malignant neoplasm of tongue: Secondary | ICD-10-CM

## 2016-06-21 DIAGNOSIS — R918 Other nonspecific abnormal finding of lung field: Secondary | ICD-10-CM

## 2016-06-21 DIAGNOSIS — K769 Liver disease, unspecified: Secondary | ICD-10-CM | POA: Insufficient documentation

## 2016-06-21 DIAGNOSIS — I2510 Atherosclerotic heart disease of native coronary artery without angina pectoris: Secondary | ICD-10-CM | POA: Diagnosis not present

## 2016-06-21 DIAGNOSIS — Z298 Encounter for other specified prophylactic measures: Secondary | ICD-10-CM

## 2016-06-21 DIAGNOSIS — I10 Essential (primary) hypertension: Secondary | ICD-10-CM

## 2016-06-21 DIAGNOSIS — I700 Atherosclerosis of aorta: Secondary | ICD-10-CM | POA: Diagnosis not present

## 2016-06-21 DIAGNOSIS — R911 Solitary pulmonary nodule: Secondary | ICD-10-CM | POA: Insufficient documentation

## 2016-06-21 DIAGNOSIS — C01 Malignant neoplasm of base of tongue: Secondary | ICD-10-CM

## 2016-06-21 LAB — BUN AND CREATININE (CC13)
BUN: 15.2 mg/dL (ref 7.0–26.0)
Creatinine: 1.3 mg/dL (ref 0.7–1.3)
EGFR: 54 mL/min/{1.73_m2} — ABNORMAL LOW (ref >90)

## 2016-06-21 LAB — TSH: TSH: 3.112 m[IU]/L (ref 0.320–4.118)

## 2016-06-21 MED ORDER — IOPAMIDOL (ISOVUE-300) INJECTION 61%
75.0000 mL | Freq: Once | INTRAVENOUS | Status: AC | PRN
  Administered 2016-06-21: 75 mL via INTRAVENOUS

## 2016-06-21 NOTE — Progress Notes (Signed)
CLINIC:  Survivorship  REASON FOR VISIT:  Routine follow-up post-treatment for a recent history of head & neck cancer.  BRIEF ONCOLOGIC HISTORY:  Oncology History   Cancer of base of tongue   Staging form: Lip and Oral Cavity, AJCC 7th Edition     Clinical stage from 12/03/2014: Stage IVA (T2, N2a, M0) - Signed by Heath Lark, MD on 12/13/2014 HPV positive by PCR testing.     Cancer of base of tongue ([place])   11/20/2014 Pathology Results    Accession: [id number] confirm squamous cell carcinoma.      11/20/2014 Procedure    The patient underwent fine-needle aspirate of the right lymph node      12/02/2014 Imaging    CT scan showed 2.2 cm the right tongue base mass with necrotic 4 cm right level II nodal metastasis with multiple bilateral small lymphadenopathy      12/10/2014 Imaging    PET CT scan showed tongue base mass with right level II lymph node metastasis      12/18/2014 Pathology Results    PCR results from [facility]: HPV 16 positive.  HPV 18 and other types negative.       12/24/2014 Procedure    He has placement of port and feeding tube      01/01/2015 - 02/12/2015 Chemotherapy    He received high dose cisplatin      01/01/2015 - 02/18/2015 Radiation Therapy    Rec'd concurrent RT to base of tongue and bilateral neck:  70 Gy in 35 fractions to gross disease, 63 Gy in 35 fractions to high risk nodal echelons, 56 Gy in 35 fractions to intermediate risk nodal echelons.      01/21/2015 Adverse Reaction     cycle 2 chemotherapy dose will be reduced by 25% due to intolerable side effects      02/11/2015 Adverse Reaction    Cycle 3 of chemotherapy dose will be reduced to 50% due to intolerable side effects      07/17/2015 Procedure    Both feeding tube and port were removed       INTERVAL HISTORY:  Mr. Hoylman reports to the [facility] for routine follow-up since completing treatment about 2 years and 4 months ago. He reports feeling really well; he continues  to play golf about once per week.  His appetite is good; "I can eat whatever I want without any problems."  He enjoys a variety of foods. He drinks plenty of water to help with the dry mouth, which he reports is "not that bad."  He has no pain. He is sleeping well.  Denies cough or shortness of breath.  He has occasional constipation, then occasional diarrhea.  He has noticed that his blood pressure has been "creeping up" and wants to know what he should do about that.    He had a repeat CT chest today for chronic lung nodules that are being monitored; he is here to get these results.      ADDITIONAL REVIEW OF SYSTEMS:  Review of Systems  Constitutional: Negative.   HENT: Negative.   Eyes: Negative.   Respiratory: Negative.   Cardiovascular: Negative.   Gastrointestinal: Positive for constipation and diarrhea.  Genitourinary: Negative.   Musculoskeletal: Negative.   Skin: Negative.   Neurological: Negative.   Endo/Heme/Allergies: Negative.   Psychiatric/Behavioral: Negative.     A 14-point review of systems was completed and was negative, except as noted above.   ONCOLOGY TREATMENT TEAM:  1. ENT:  Dr. Janace Hoard 2. Medical Oncologist: Dr. Alvy Bimler  3. Radiation Oncologist: Dr. Isidore Moos    PAST MEDICAL/SURGICAL HISTORY:  Past Medical History:  Diagnosis Date  . ARTHRITIS 10/22/2008  . BPH (benign prostatic hyperplasia)   . CHF (congestive heart failure) [facility])    sees Dr. Peter [name]   . Dilated cardiomyopathy ([place])   . DIVERTICULOSIS, COLON 12/08/2007  . Glaucoma   . H/O asbestos exposure   . History of echocardiogram 05/22/2007   EF was 45-50% / Mild concentric LV hypertrophy with mild global hypokinesis and overall mild systolic dysfunction .  Mild AV sclerosis / Mild Mitral insufficiency / compared to prior study 04/24/02 -- LV function has improved further.    . Hypercholesterolemia   . Hypertension   . Insomnia 06/11/2015  . Radiation 01/01/15-02/18/15   base of tongue and  bilateral neck 70 Gy  . Skin cancer    squamous cell, basal cell  . Squamous cell carcinoma ([place]) 11/20/14   base of tongue primary   Past Surgical History:  Procedure Laterality Date  . CARDIAC CATHETERIZATION  10/17/2001   EF estimated at 30% / moderate LV enlargement  / 1. Minimal nonobstructive atherosclerotic coronary artery disease / 2. Severe LV dysfunction with global hypokinesia consistent with dilated nonischemic cardiomyopath / 3. Moderate pulmonary hypertension  . COLONOSCOPY  08-15-07   per Dr. Carlean Purl, clear , repeat in 10 yrs  . LYMPH NODE BIOPSY       ALLERGIES:  No Known Allergies    CURRENT MEDICATIONS:  Current Outpatient Prescriptions on File Prior to Visit  Medication Sig Dispense Refill  . aspirin EC 81 MG tablet Take 81 mg by mouth daily.    . carvedilol (COREG) 25 MG tablet Take 1 tablet (25 mg total) by mouth 2 (two) times daily with a meal. 180 tablet 3  . diclofenac (VOLTAREN) 75 MG EC tablet Take 1 tablet (75 mg total) by mouth 2 (two) times daily. 180 tablet 3  . sodium fluoride (FLUORISHIELD) 1.1 % GEL dental gel Instill one drop of gel per tooth space of fluoride tray. Place over teeth for 5 minutes. Remove. Spit out excess. Repeat nightly. 120 mL prn  . tamsulosin (FLOMAX) 0.4 MG CAPS capsule Take 1 capsule (0.4 mg total) by mouth daily. 30 capsule 11  . TRAVATAN Z 0.004 % SOLN ophthalmic solution Place 1 drop into both eyes at bedtime.     . traZODone (DESYREL) 50 MG tablet Take 1 tablet (50 mg total) by mouth at bedtime. 90 tablet 3  . tadalafil (CIALIS) 20 MG tablet Take 1 tablet (20 mg total) by mouth daily as needed for erectile dysfunction. (Patient not taking: Reported on 12/19/2015) 30 tablet 3   No current facility-administered medications on file prior to visit.        ONCOLOGIC FAMILY HISTORY:  Family History  Problem Relation Age of Onset  . Stroke Father   . Cancer Father     kidney ca  . Angina Mother       SOCIAL HISTORY:  Mr.  Jaracz is married and lives with his wife in [place], [place].  He has 4 daughters and several grandchildren.  Mr. Blome  is currently retired and previously worked as a Psychologist, forensic.  He enjoys golfing and spending time with his family.  He denies any current use of tobacco or illicit drug use. He drinks beer occasionally. He has never smoked.   PHYSICAL EXAMINATION:  Vital Signs: Vitals:   06/21/16 1511  BP: (!) 144/77  Pulse: 72  Resp: 18  Temp: 98.3 F (36.8 C)    Filed Weights   06/21/16 1511  Weight: 163 lb 4.8 oz (74.1 kg)   General: Well-nourished, well-appearing in no acute distress.  He is unaccompanied today.   HEENT: Head is atraumatic and normocephalic.  Pupils equal and reactive to light. Conjunctivae mildly erythematous without exudate.  Sclerae anicteric. Oral mucosa is pink, moist, and intact without lesions.  Tongue pink and slightly dry. No palpable masses to the tongue, buccal mucosa, or floor of mouth.  Oropharynx is pink without lesions or erythema.  Neck: No palpable masses; mild fibrosis associated with radiation changes.  Lymph: Mild submental lymphedema.  Cardiovascular: Regular rate and rhythm. Respiratory: Clear to auscultation bilaterally. Chest expansion symmetric without accessory muscle use; breathing non-labored. GI: Abdomen soft and round. No tenderness to palpation. Bowel sounds normoactive.  GU: Deferred.  Neuro: No focal deficits. Steady gait.  Psych: Mood and affect normal and appropriate for situation.  Extremities: No edema. Skin: Warm and dry.    LABORATORY DATA:  Results for TRYCE, GENAW (MRN [id number]) as of 06/21/2016  Ref. Range 06/21/2016 08:31  TSH Latest Ref Range: 0.320 - 4.118 m(IU)/L 3.112    DIAGNOSTIC IMAGING:  CT chest-06/21/16      ASSESSMENT AND PLAN:  Mr. Lyu is a very pleasant 75 y.o. 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    1. History of Stage IVA, HPV+ base of tongue (BOT) cancer:  Mr. Sorden is clinically without evidence of cancer recurrence today.  He will see his ENT physician, Dr. Janace Hoard, in 08/2016 and likely again in 12/2016.  I will arrange for Mr. Millender to see Dr. Isidore Moos sometime in 02/2017. He is welcome to return to the [facility]orship [facility] at any time in the future for continued surveillance; I will defer to Dr. Pearlie Oyster recommendations after she sees him in her clinic next year.    2. Lung nodules, likely benign etiology: The left lower lobe lung nodules that we have been following serially since 12/10/14 are stable and largely unchanged since that time, which is likely a benign etiology. I reviewed the CT chest results with the patient and his wife today; I provided a copy of the report to them as well.  I explained that lung nodules are very common and this CT chest is reassuring that they are benign, given their stability for 1.5 years.  We discussed that since these results have been stable and likely benign, we probably do not need to continue with surveillance chest imaging.  Mr. Sharman is reluctant not to have scans in the future, but understands the rationale.  I offered that we could do 1 more CT scan of his chest sometime early in the new year (like in 12/2016) if he would prefer, and that would confirm 2 years of stability. I recommended that we not continue imaging him, but he will think about having 1 more CT scan and let me know if he'd like me to order it and have it done for his peace of mind.  We will continue to follow him clinically either way and can certainly order more imaging if he becomes symptomatic, which I very highly doubt will happen.  3. Hypertension: Mr. Dubs BP is mildly elevated today and it concerns him.  He tells  me that he has noticed his BP "creeping up" over the past several months.  I encouraged him to check his blood pressures at home and keep a log; he is scheduled to see his PCP sometime in 09/2016, but he may move up that appointment if his BPs continue to rise.  Prior to his cancer diagnosis, he was on anti-hypertensive agents; these were stopped during treatment when his BP became too low.  However, he understands that he may need to resume his BP medication, but only under the direction of his PCP.  Keeping the log will help his PCP better manage his blood pressure and I explained the rationale for this.  He will keep and log and contact his PCP as appropriate.   4.  At risk for hypothyroidism: The thyroid gland is often affected after treatment for head & neck cancer.  Mr. Killmer TSH today is normal at 3.112.  He will continue to have serial TSH monitoring for the next 5 years as part of his routine follow-up and post-cancer treatment care.   5. At risk for tooth decay/dental concerns: After treatment with radiation for head & neck cancers, patients often experience xerostomia which increases their risk of dental caries. Mr. Priess was encouraged to see his dentist 3-4 times per year.  He should also continue to use his fluoride trays daily, as directed by dentistry.  I encouraged him to reach out to either Dr. Enrique Sack (oncology dentist) or his primary care dentist with additional questions or concerns.   6.  Lung cancer screening: Mr. Schwiesow currently does not meet criteria for lung cancer screening, as he has never smoked.    7. Tobacco & alcohol use: Mr. Amber has never smoked and continues to abstain from all tobacco products.  I congratulated his continued efforts to remain tobacco free.  I also reinforced the importance of limiting alcohol consumption as well.  Both tobacco and alcohol use increases the risk of recurrence of head & neck cancers in some patients.  They also increase the risk of  other cancers, as well.  Mr. Gunnells states that he voiced understanding of the importance of continuing to remain both tobacco free and limiting alcohol.   8. Health maintenance and wellness promotion: Cancer patients who consume a diet rich in fruits and vegetables have better overall health and decreased risk of cancer recurrence. Mr. Vonada was encouraged to consume 5-7 servings of fruits and vegetables per day, as tolerated. He was also encouraged to engage in moderate to vigorous exercise for 30 minutes per day most days of the week.    A total of 30 minutes of face-to-face time was spent with this patient with greater than 50% of that time in counseling and care-coordination.   Mike Craze, NP [facility] [place] [phone]   Note: PRIMARY CARE PROVIDER Laurey Morale, MD [place] Fax: [phone]

## 2016-07-01 ENCOUNTER — Ambulatory Visit (INDEPENDENT_AMBULATORY_CARE_PROVIDER_SITE_OTHER): Payer: Medicare HMO | Admitting: Family Medicine

## 2016-07-01 ENCOUNTER — Encounter: Payer: Self-pay | Admitting: Family Medicine

## 2016-07-01 ENCOUNTER — Ambulatory Visit: Payer: Medicare HMO

## 2016-07-01 VITALS — BP 128/72 | HR 70 | Temp 98.1°F | Ht 69.0 in | Wt 162.0 lb

## 2016-07-01 DIAGNOSIS — Z23 Encounter for immunization: Secondary | ICD-10-CM

## 2016-07-01 DIAGNOSIS — I10 Essential (primary) hypertension: Secondary | ICD-10-CM

## 2016-07-01 DIAGNOSIS — Z8679 Personal history of other diseases of the circulatory system: Secondary | ICD-10-CM | POA: Diagnosis not present

## 2016-07-01 NOTE — Progress Notes (Signed)
Pre visit review using our clinic review tool, if applicable. No additional management support is needed unless otherwise documented below in the visit note. 

## 2016-07-01 NOTE — Progress Notes (Signed)
   Subjective:    Patient ID: Nicholas Wilkerson, male    DOB: 1941-05-01, 75 y.o.   MRN: [id number]  HPI Here to ask about BP. He feels fine and stays active. He started checking a daily BP 3 weeks ago and most of these readings are in the [id number] or [id number] systolic over Q000111Q or [id number] diastolic. On 2 occasions however he got systolic readings of 123456 and 123456 over a diastolic of 90. He asks if this is okay.    Review of Systems  Constitutional: Negative.   Respiratory: Negative.   Cardiovascular: Negative.   Neurological: Negative.        Objective:   Physical Exam  Constitutional: He is oriented to person, place, and time. He appears well-developed and well-nourished.  Neck: No thyromegaly present.  Cardiovascular: Normal rate, regular rhythm, normal heart sounds and intact distal pulses.   Pulmonary/Chest: Effort normal and breath sounds normal.  Lymphadenopathy:    He has no cervical adenopathy.  Neurological: He is alert and oriented to person, place, and time.          Assessment & Plan:  I reassured him that his BP is well controlled. If he starts to have systolic readings consistently in the 140s he will let me know. Otherwise he will return in December for a cpx.

## 2016-07-02 ENCOUNTER — Telehealth: Payer: Self-pay | Admitting: *Deleted

## 2016-07-02 NOTE — Telephone Encounter (Signed)
Patient returned call to Romie Jumper, checked her note his follow up with Dr. Isidore Moos is for 02/25/2017, informed him of this time, he asked that we mail him a card and call 1-2 days before for his follow up, he will not remember this, will let Enid Derry know, 2:59 PM

## 2016-07-02 NOTE — Telephone Encounter (Signed)
CALLED PATIENT TO INFORM OF FU WITH DR. Isidore Moos ON 02/25/17 @ 10 AM, LVM FOR A RETURN CALL

## 2016-07-07 ENCOUNTER — Telehealth: Payer: Self-pay | Admitting: Hematology and Oncology

## 2016-07-07 NOTE — Telephone Encounter (Signed)
Faxed pt records to [facility] [phone]

## 2016-08-10 IMAGING — CT CT NECK W/ CM
3 of 5 series · 14 of 33 positions shown, 17 images · IV contrast (75CC OMNI 300)
Comparison: Cervical spine CT 09/11/2009

CLINICAL DATA: Lymphadenopathy. Lump on right side of neck since
[DATE]. History of basal and squamous cell carcinomas of the skin
of the face and head status post resection.

EXAM:
CT NECK WITH CONTRAST
TECHNIQUE: Multidetector CT imaging of the neck was performed using the
standard protocol following the bolus administration of intravenous
contrast.
CONTRAST:  75mL OMNIPAQUE IOHEXOL 300 MG/ML  SOLN

[Series 200: angled for hyoid · axial · 0.49mm/px · z∈[-151,+40]mm · 6 of 147 slices shown, 8 images]
[im 21/147  soft-tissue]
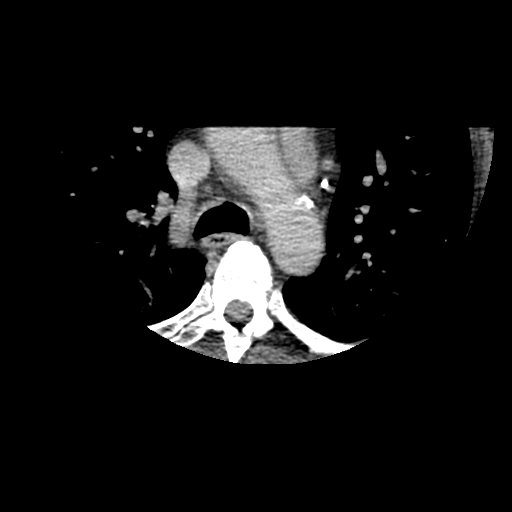
[im 21/147  bone]
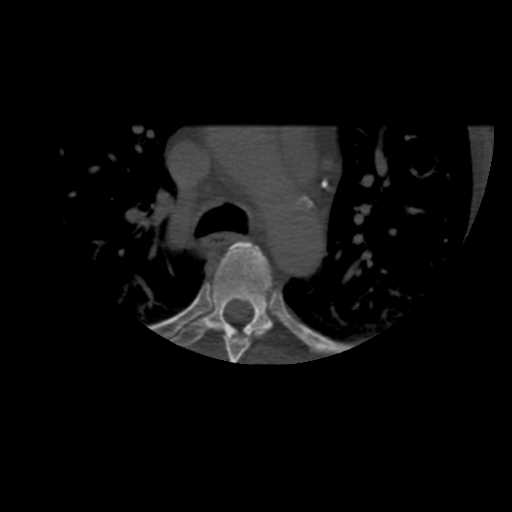
[im 42/147  bone]
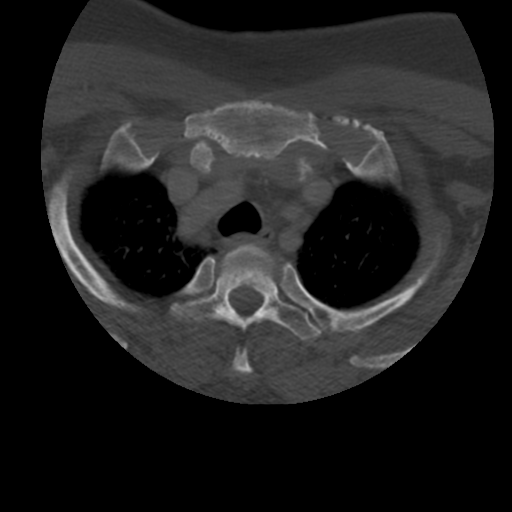
[im 63/147  bone]
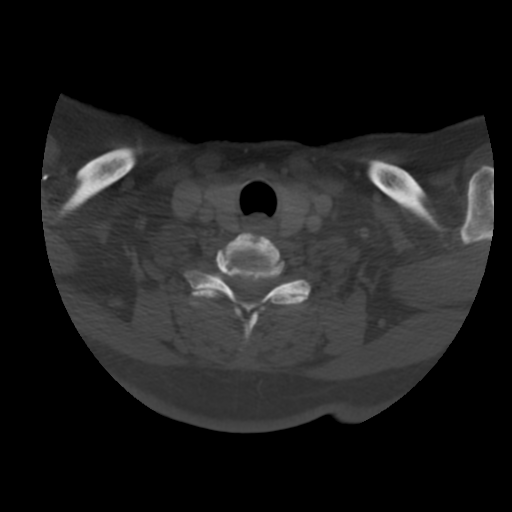
[im 84/147  bone]
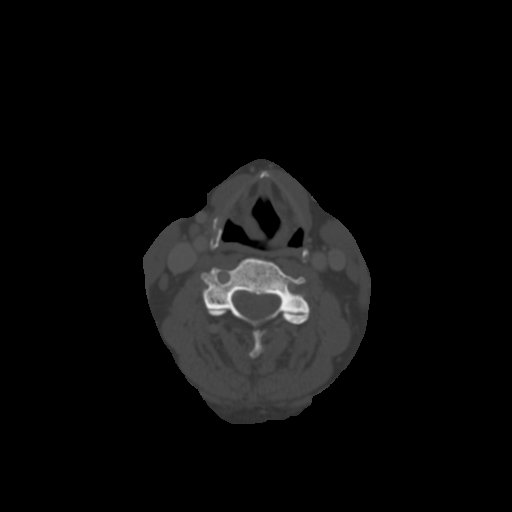
[im 105/147  soft-tissue]
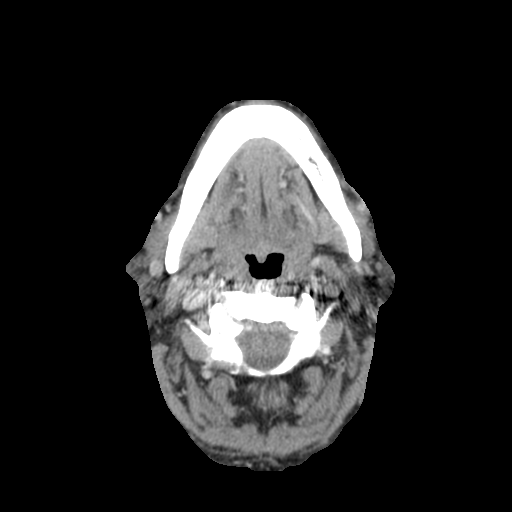
[im 105/147  bone]
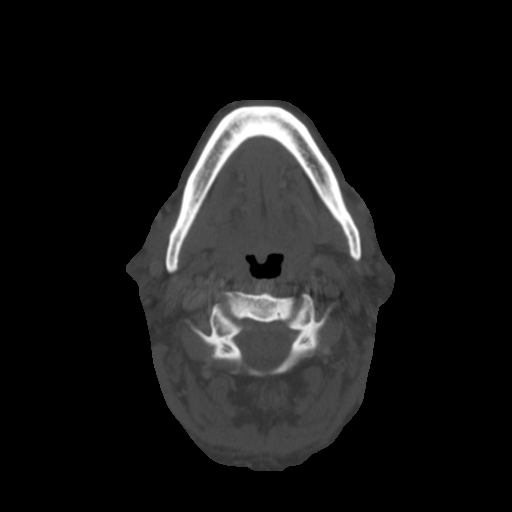
[im 126/147  bone]
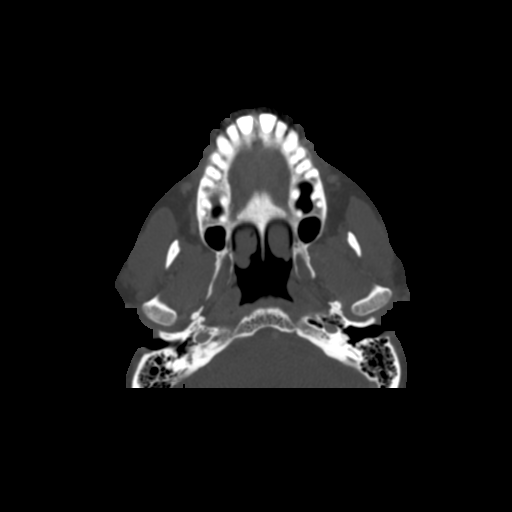

[Series 201: cor · coronal · 0.49mm/px · 3 of 101 slices shown]
[im 25/101  bone]
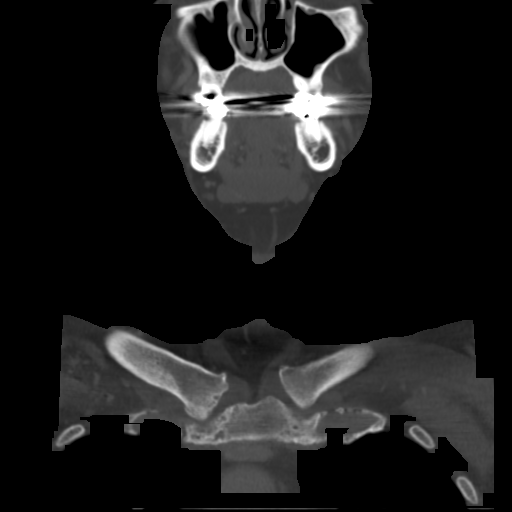
[im 42/101  bone]
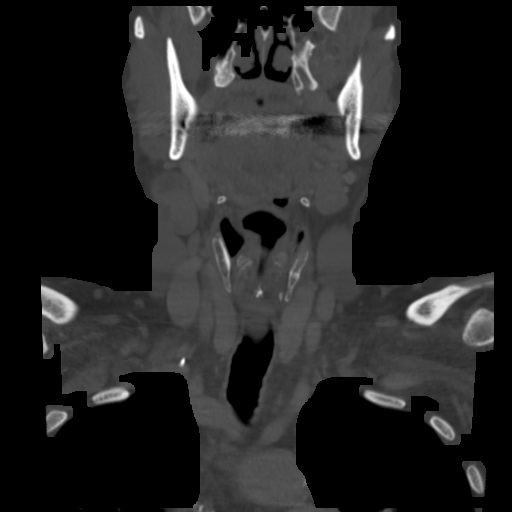
[im 59/101  bone]
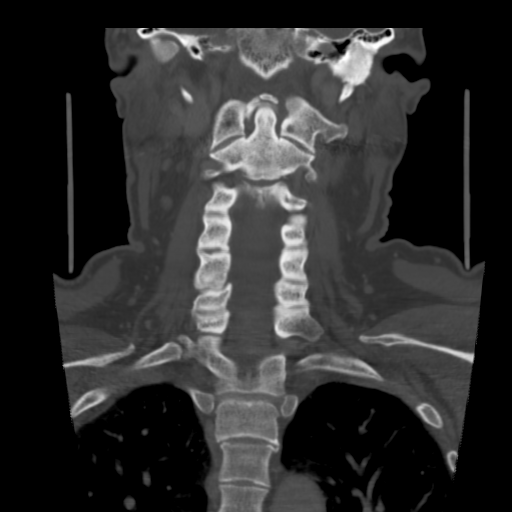

[Series 202: sag · sagittal · 0.49mm/px · 5 of 108 slices shown, 6 images]
[im 36/108  bone]
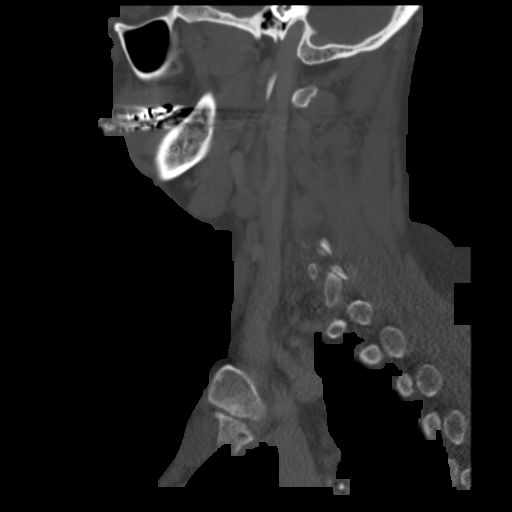
[im 45/108  bone]
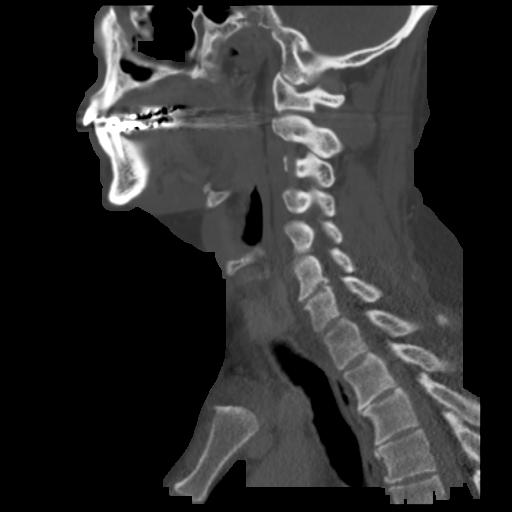
[im 54/108  soft-tissue]
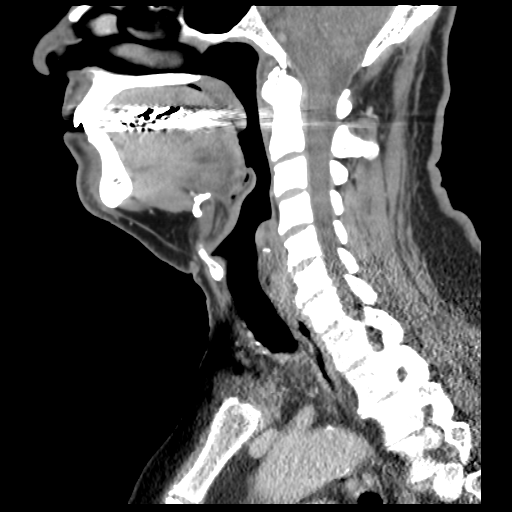
[im 54/108  bone]
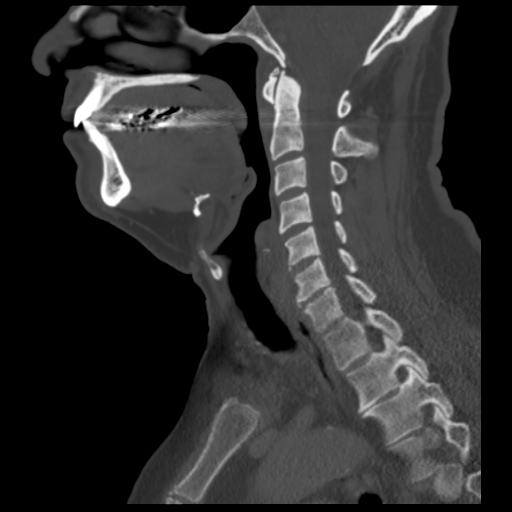
[im 63/108  bone]
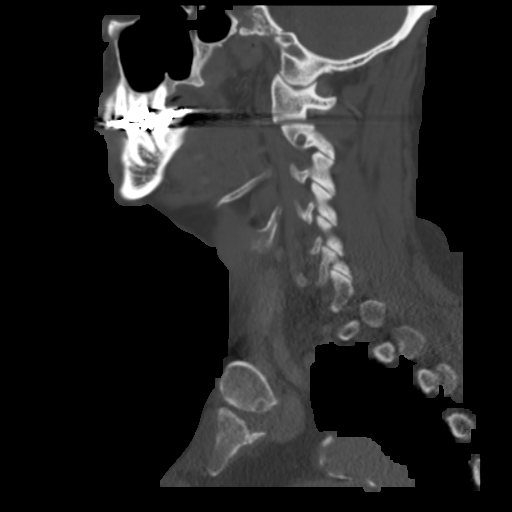
[im 72/108  bone]
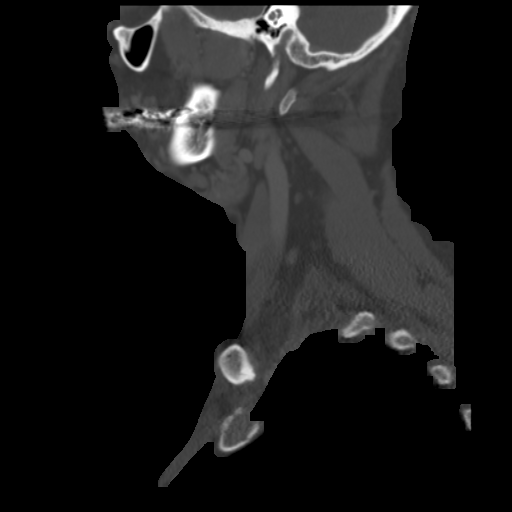

[14 of 33 positions shown; findings below may reference images not displayed]

FINDINGS: Pharynx and larynx: The nasopharynx is unremarkable. There is an
enhancing mass involving the right tongue base protruding into the
vallecula which measures approximately 10 x 7 x 22 mm (transverse by
AP by craniocaudal). This appears to extend laterally to the right
lateral oropharyngeal wall and does not appear to involve the
epiglottis. The larynx is unremarkable.

Salivary glands: The submandibular and parotid glands are
unremarkable.

Thyroid: Unremarkable.

Lymph nodes: An nenlarged, partially necrotic right level IIA lymph
node measures 2.6 x 2.4 x 4.0 cm. Immediately inferior to this in
level III is a small but asymmetric lymph node measuring 5 mm in
short axis, possibly with some subtle necrosis (series 3, image 46).
A small but asymmetric right level III lymph node more posteriorly
measures 4 mm (series 3, image 45). A slightly asymmetric right
level IIB lymph node measures 6 mm (series 3, image 34). A small but
asymmetric left level III lymph node measures 6 mm (series 3, image
50). Multiple subcentimeter lymph nodes are partially visualized in
the mediastinum.

Vascular: Major vascular structures of the appear patent. Mild
atherosclerosis is noted at the carotid bifurcations.

Limited intracranial: The visualized portion of the brain is
unremarkable.

Visualized orbits: Unremarkable.

Mastoids and visualized paranasal sinuses: Clear.

Skeleton: Mild cervical spondylosis. No suspicious lytic or blastic
osseous lesions identified.

Upper chest: Visualized lung apices are clear.
IMPRESSION: 2.2 cm right tongue base mass concerning for malignancy. Necrotic
right level II nodal metastasis. Subcentimeter right level II and
bilateral level III lymph nodes are indeterminate.

## 2016-09-05 ENCOUNTER — Other Ambulatory Visit: Payer: Self-pay | Admitting: Family Medicine

## 2016-09-05 ENCOUNTER — Other Ambulatory Visit: Payer: Self-pay | Admitting: Cardiology

## 2016-09-06 NOTE — Telephone Encounter (Signed)
REFILL 

## 2016-09-23 ENCOUNTER — Ambulatory Visit (INDEPENDENT_AMBULATORY_CARE_PROVIDER_SITE_OTHER): Payer: Medicare HMO | Admitting: Family Medicine

## 2016-09-23 ENCOUNTER — Other Ambulatory Visit: Payer: Self-pay | Admitting: Emergency Medicine

## 2016-09-23 ENCOUNTER — Encounter: Payer: Self-pay | Admitting: Family Medicine

## 2016-09-23 VITALS — BP 118/77 | HR 73 | Temp 97.7°F | Ht 69.0 in | Wt 161.0 lb

## 2016-09-23 DIAGNOSIS — Z23 Encounter for immunization: Secondary | ICD-10-CM | POA: Diagnosis not present

## 2016-09-23 DIAGNOSIS — Z0000 Encounter for general adult medical examination without abnormal findings: Secondary | ICD-10-CM

## 2016-09-23 LAB — BASIC METABOLIC PANEL
BUN: 17 mg/dL (ref 6–23)
CHLORIDE: 105 meq/L (ref 96–112)
CO2: 34 mEq/L — ABNORMAL HIGH (ref 19–32)
Calcium: 9.2 mg/dL (ref 8.4–10.5)
Creatinine, Ser: 1.15 mg/dL (ref 0.40–1.50)
GFR: 66.24 mL/min (ref >60.00)
Glucose, Bld: 104 mg/dL — ABNORMAL HIGH (ref 70–99)
POTASSIUM: 4.1 meq/L (ref 3.5–5.1)
Sodium: 143 mEq/L (ref 135–145)

## 2016-09-23 LAB — POC URINALSYSI DIPSTICK (AUTOMATED)
BILIRUBIN UA: NEGATIVE
Glucose, UA: NEGATIVE
Ketones, UA: NEGATIVE
Leukocytes, UA: NEGATIVE
NITRITE UA: NEGATIVE
PH UA: 6
Spec Grav, UA: 1.02
UROBILINOGEN UA: 0.2

## 2016-09-23 LAB — LIPID PANEL
CHOL/HDL RATIO: 5
Cholesterol: 202 mg/dL — ABNORMAL HIGH (ref 0–200)
HDL: 40.5 mg/dL (ref >39.00)
LDL CALC: 137 mg/dL — AB (ref 0–99)
NONHDL: 161.09
Triglycerides: 119 mg/dL (ref 0.0–149.0)
VLDL: 23.8 mg/dL (ref 0.0–40.0)

## 2016-09-23 LAB — HEPATIC FUNCTION PANEL
ALT: 16 U/L (ref 0–53)
AST: 14 U/L (ref 0–37)
Albumin: 4.1 g/dL (ref 3.5–5.2)
Alkaline Phosphatase: 74 U/L (ref 39–117)
BILIRUBIN TOTAL: 0.5 mg/dL (ref 0.2–1.2)
Bilirubin, Direct: 0.1 mg/dL (ref 0.0–0.3)
Total Protein: 6.1 g/dL (ref 6.0–8.3)

## 2016-09-23 LAB — PSA: PSA: 3.78 ng/mL (ref 0.10–4.00)

## 2016-09-23 LAB — TSH: TSH: 3.64 u[IU]/mL (ref 0.35–4.50)

## 2016-09-23 MED ORDER — DICLOFENAC SODIUM 75 MG PO TBEC: 75.0000 mg | DELAYED_RELEASE_TABLET | Freq: Two times a day (BID) | ORAL | 3 refills | Status: DC

## 2016-09-23 MED ORDER — TAMSULOSIN HCL 0.4 MG PO CAPS: 0.4000 mg | ORAL_CAPSULE | Freq: Every day | ORAL | 0 refills | Status: DC

## 2016-09-23 MED ORDER — TAMSULOSIN HCL 0.4 MG PO CAPS: 0.4000 mg | ORAL_CAPSULE | Freq: Every day | ORAL | 3 refills | Status: DC

## 2016-09-23 MED ORDER — TRAZODONE HCL 50 MG PO TABS: 50.0000 mg | ORAL_TABLET | Freq: Every day | ORAL | 3 refills | Status: DC

## 2016-09-23 MED ORDER — CARVEDILOL 25 MG PO TABS: 25.0000 mg | ORAL_TABLET | Freq: Two times a day (BID) | ORAL | 3 refills | Status: DC

## 2016-09-23 NOTE — Progress Notes (Signed)
   Subjective:    Patient ID: Nicholas Wilkerson, male    DOB: [birth year], 75 y.o.   MRN: [id number]  HPI 75 yr old male for a well exam. He feels fine. He stays active and walks 2 miles almost every morning.    Review of Systems  Constitutional: Negative.   HENT: Negative.   Eyes: Negative.   Respiratory: Negative.   Cardiovascular: Negative.   Gastrointestinal: Negative.   Genitourinary: Negative.   Musculoskeletal: Negative.   Skin: Negative.   Neurological: Negative.   Psychiatric/Behavioral: Negative.        Objective:   Physical Exam  Constitutional: He is oriented to person, place, and time. He appears well-developed and well-nourished. No distress.  HENT:  Head: Normocephalic and atraumatic.  Right Ear: External ear normal.  Left Ear: External ear normal.  Nose: Nose normal.  Mouth/Throat: Oropharynx is clear and moist. No oropharyngeal exudate.  Eyes: Conjunctivae and EOM are normal. Pupils are equal, round, and reactive to light. Right eye exhibits no discharge. Left eye exhibits no discharge. No scleral icterus.  Neck: Neck supple. No JVD present. No tracheal deviation present. No thyromegaly present.  Cardiovascular: Normal rate, regular rhythm, normal heart sounds and intact distal pulses.  Exam reveals no gallop and no friction rub.   No murmur heard. Pulmonary/Chest: Effort normal and breath sounds normal. No respiratory distress. He has no wheezes. He has no rales. He exhibits no tenderness.  Abdominal: Soft. Bowel sounds are normal. He exhibits no distension and no mass. There is no tenderness. There is no rebound and no guarding.  Genitourinary: Rectum normal, prostate normal and penis normal. Rectal exam shows guaiac negative stool. No penile tenderness.  Musculoskeletal: Normal range of motion. He exhibits no edema or tenderness.  Lymphadenopathy:    He has no cervical adenopathy.  Neurological: He is alert and oriented to person, place, and time. He has  normal reflexes. No cranial nerve deficit. He exhibits normal muscle tone. Coordination normal.  Skin: Skin is warm and dry. No rash noted. He is not diaphoretic. No erythema. No pallor.  Psychiatric: He has a normal mood and affect. His behavior is normal. Judgment and thought content normal.          Assessment & Plan:  Well exam. We discussed diet and exercise. Get fasting labs today.  Laurey Morale, MD

## 2016-09-23 NOTE — Progress Notes (Signed)
Pre visit review using our clinic review tool, if applicable. No additional management support is needed unless otherwise documented below in the visit note. 

## 2016-09-24 LAB — CBC WITH DIFFERENTIAL/PLATELET
Basophils Absolute: 0 10*3/uL (ref 0.0–0.1)
Basophils Relative: 0.3 % (ref 0.0–3.0)
Eosinophils Absolute: 0.1 10*3/uL (ref 0.0–0.7)
Eosinophils Relative: 1.7 % (ref 0.0–5.0)
HEMATOCRIT: 47.2 % (ref 39.0–52.0)
HEMOGLOBIN: 15.8 g/dL (ref 13.0–17.0)
LYMPHS PCT: 17.4 % (ref 12.0–46.0)
Lymphs Abs: 0.8 10*3/uL (ref 0.7–4.0)
MCHC: 33.5 g/dL (ref 30.0–36.0)
MCV: 91.7 fl (ref 78.0–100.0)
MONOS PCT: 11.1 % (ref 3.0–12.0)
Monocytes Absolute: 0.5 10*3/uL (ref 0.1–1.0)
Neutro Abs: 3.4 10*3/uL (ref 1.4–7.7)
Neutrophils Relative %: 69.5 % (ref 43.0–77.0)
Platelets: 201 10*3/uL (ref 150.0–400.0)
RBC: 5.15 Mil/uL (ref 4.22–5.81)
RDW: 14.8 % (ref 11.5–15.5)
WBC: 4.9 10*3/uL (ref 4.0–10.5)

## 2016-10-12 NOTE — Progress Notes (Signed)
Nicholas Wilkerson Date of Birth: 1941-02-21   History of Present Illness: Nicholas Wilkerson is seen for followup. He has a history of dilated cardiomyopathy with initial ejection fraction of 30%. Subsequent evaluation has demonstrated improvement to 50-55%. Last Echo was in May 2012. 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 He was treated with RT and chemo.  On follow up today he is doing very well.  No cardiac problems noted. Ramipril was stopped previously due to low BP. No dyspnea, chest pain, edema, or palpitations. Eating well. No evidence of recurrent CA.  Current Outpatient Prescriptions on File Prior to Visit  Medication Sig Dispense Refill  . aspirin EC 81 MG tablet Take 81 mg by mouth daily.    . carvedilol (COREG) 25 MG tablet Take 1 tablet (25 mg total) by mouth 2 (two) times daily with a meal. KEEP OV. 180 tablet 3  . diclofenac (VOLTAREN) 75 MG EC tablet Take 1 tablet (75 mg total) by mouth 2 (two) times daily. 180 tablet 3  . sodium fluoride (FLUORISHIELD) 1.1 % GEL dental gel Instill one drop of gel per tooth space of fluoride tray. Place over teeth for 5 minutes. Remove. Spit out excess. Repeat nightly. 120 mL prn  . tadalafil (CIALIS) 20 MG tablet Take 1 tablet (20 mg total) by mouth daily as needed for erectile dysfunction. 30 tablet 3  . tamsulosin (FLOMAX) 0.4 MG CAPS capsule Take 1 capsule (0.4 mg total) by mouth daily. 30 capsule 3  . TRAVATAN Z 0.004 % SOLN ophthalmic solution Place 1 drop into both eyes at bedtime.     . traZODone (DESYREL) 50 MG tablet Take 1 tablet (50 mg total) by mouth at bedtime. 90 tablet 3   No current facility-administered medications on file prior to visit.     No Known Allergies  Past Medical History:  Diagnosis Date  . ARTHRITIS 10/22/2008  . BPH (benign prostatic hyperplasia)   . CHF (congestive heart failure) [facility])    sees Dr. Peter [name]   . Dilated cardiomyopathy ([place])   . DIVERTICULOSIS, COLON 12/08/2007  . Glaucoma    . H/O asbestos exposure   . History of echocardiogram 05/22/2007   EF was 45-50% / Mild concentric LV hypertrophy with mild global hypokinesis and overall mild systolic dysfunction .  Mild AV sclerosis / Mild Mitral insufficiency / compared to prior study 04/24/02 -- LV function has improved further.    . Hypercholesterolemia   . Hypertension   . Insomnia 06/11/2015  . Radiation 01/01/15-02/18/15   base of tongue and bilateral neck 70 Gy  . Skin cancer    squamous cell, basal cell  . Squamous cell carcinoma 11/20/14   base of tongue primary    Past Surgical History:  Procedure Laterality Date  . CARDIAC CATHETERIZATION  10/17/2001   EF estimated at 30% / moderate LV enlargement  / 1. Minimal nonobstructive atherosclerotic coronary artery disease / 2. Severe LV dysfunction with global hypokinesia consistent with dilated nonischemic cardiomyopath / 3. Moderate pulmonary hypertension  . COLONOSCOPY  08-15-07   per Dr. Carlean Purl, clear , repeat in 10 yrs  . LYMPH NODE BIOPSY      History  Smoking Status  . Never Smoker  Smokeless Tobacco  . Never Used    History  Alcohol Use No    Family History  Problem Relation Age of Onset  . Stroke Father   . Cancer Father     kidney ca  .  Angina Mother     Review of Systems:  All other systems were reviewed and are negative.  Physical Exam: BP 130/70 (BP Location: Right Arm)   Pulse 74   Ht 5\' 9"  (1.753 m)   Wt 165 lb 12.8 oz (75.2 kg)   BMI 24.48 kg/m  He is a pleasant white male in no acute distress. His HEENT exam is unremarkable. No JVD or bruits. Lungs are clear. Cardiac exam reveals a regular rate and rhythm without gallop or murmur. Normal S1-2. Abdomen soft and nontender. No masses or HSM.  He has no edema. Pedal pulses are good. Neuro exam is without focal findings.  LABORATORY DATA:  Lab Results  Component Value Date   WBC 4.9 09/23/2016   HGB 15.8 09/23/2016   HCT 47.2 09/23/2016   PLT 201.0 09/23/2016   GLUCOSE  104 (H) 09/23/2016   CHOL 202 (H) 09/23/2016   TRIG 119.0 09/23/2016   HDL 40.50 09/23/2016   LDLCALC 137 (H) 09/23/2016   ALT 16 09/23/2016   AST 14 09/23/2016   NA 143 09/23/2016   K 4.1 09/23/2016   CL 105 09/23/2016   CREATININE 1.15 09/23/2016   BUN 17 09/23/2016   CO2 34 (H) 09/23/2016   TSH 3.64 09/23/2016   PSA 3.78 09/23/2016   INR 0.95 07/17/2015      Assessment / Plan:  1. Dilated cardiomyopathy with recovery of LV function to 50-55%. We will continue long-term carvedilol. Leave off ramipril due to low BP.  Recommend repeat Echo. Follow up in one  Year.  2. Hypertension well controlled.  3. PVCs, asymptomatic. Continue carvedilol.   4. SSCA of the tongue. S/p RT and chemo.

## 2016-10-15 ENCOUNTER — Ambulatory Visit (INDEPENDENT_AMBULATORY_CARE_PROVIDER_SITE_OTHER): Payer: PPO | Admitting: Cardiology

## 2016-10-15 ENCOUNTER — Encounter: Payer: Self-pay | Admitting: Cardiology

## 2016-10-15 VITALS — BP 130/70 | HR 74 | Ht 69.0 in | Wt 165.8 lb

## 2016-10-15 DIAGNOSIS — I10 Essential (primary) hypertension: Secondary | ICD-10-CM

## 2016-10-15 DIAGNOSIS — I5022 Chronic systolic (congestive) heart failure: Secondary | ICD-10-CM | POA: Diagnosis not present

## 2016-10-15 NOTE — Patient Instructions (Addendum)
We will schedule you for an Echocardiogram  Continue your current therapy  I will see you in one year   

## 2016-11-02 ENCOUNTER — Ambulatory Visit (HOSPITAL_COMMUNITY): Payer: PPO | Attending: Internal Medicine

## 2016-11-02 ENCOUNTER — Other Ambulatory Visit: Payer: Self-pay

## 2016-11-02 DIAGNOSIS — I10 Essential (primary) hypertension: Secondary | ICD-10-CM | POA: Insufficient documentation

## 2016-11-02 DIAGNOSIS — I5022 Chronic systolic (congestive) heart failure: Secondary | ICD-10-CM | POA: Diagnosis not present

## 2016-11-05 ENCOUNTER — Other Ambulatory Visit: Payer: Self-pay

## 2016-11-05 DIAGNOSIS — I10 Essential (primary) hypertension: Secondary | ICD-10-CM

## 2016-11-05 MED ORDER — QUINAPRIL HCL 10 MG PO TABS: 10.0000 mg | ORAL_TABLET | Freq: Every day | ORAL | 3 refills | Status: DC

## 2016-11-17 DIAGNOSIS — H401131 Primary open-angle glaucoma, bilateral, mild stage: Secondary | ICD-10-CM | POA: Diagnosis not present

## 2016-11-19 DIAGNOSIS — I10 Essential (primary) hypertension: Secondary | ICD-10-CM | POA: Diagnosis not present

## 2016-11-20 LAB — BASIC METABOLIC PANEL
BUN: 17 mg/dL (ref 7–25)
CHLORIDE: 103 mmol/L (ref 98–110)
CO2: 29 mmol/L (ref 20–31)
Calcium: 9.2 mg/dL (ref 8.6–10.3)
Creat: 1.35 mg/dL — ABNORMAL HIGH (ref 0.70–1.18)
Glucose, Bld: 104 mg/dL — ABNORMAL HIGH (ref 65–99)
POTASSIUM: 4.8 mmol/L (ref 3.5–5.3)
Sodium: 139 mmol/L (ref 135–146)

## 2016-11-22 ENCOUNTER — Telehealth: Payer: Self-pay | Admitting: Cardiology

## 2016-11-22 NOTE — Telephone Encounter (Signed)
Patient called lab results given.

## 2016-11-22 NOTE — Telephone Encounter (Signed)
New message    Pt returning call to [place] about labs.

## 2016-12-23 DIAGNOSIS — Z85810 Personal history of malignant neoplasm of tongue: Secondary | ICD-10-CM | POA: Diagnosis not present

## 2017-01-13 ENCOUNTER — Encounter: Payer: Self-pay | Admitting: Internal Medicine

## 2017-01-27 ENCOUNTER — Other Ambulatory Visit: Payer: Self-pay | Admitting: Family Medicine

## 2017-01-28 NOTE — Telephone Encounter (Signed)
Pt need new Rx for tamsulosin  Pharm:  [facility] [place]   Pt needs this today if possible he is out.

## 2017-01-28 NOTE — Telephone Encounter (Signed)
Rx sent 

## 2017-02-17 NOTE — Progress Notes (Signed)
Mr. Montini presents for follow up of radiation completed 02/18/15 to his Base of tongue.  Pain issues, if any: He denies Using a feeding tube?: No Weight changes, if any:  Wt Readings from Last 3 Encounters:  02/25/17 165 lb 3.2 oz (74.9 kg)  02/21/17 166 lb (75.3 kg)  10/15/16 165 lb 12.8 oz (75.2 kg)   Swallowing issues, if any: He swallows well. He does need to drink water to help with swallowing.  Smoking or chewing tobacco? No Using fluoride trays daily? Yes Last ENT visit was on: Dr. Janace Hoard 12/23/16 Other notable issues, if any:   BP (!) 136/93   Pulse 72   Temp 99 F (37.2 C)   Ht 5\' 8"  (1.727 m)   Wt 165 lb 3.2 oz (74.9 kg)   SpO2 99% Comment: room air  BMI 25.12 kg/m

## 2017-02-21 ENCOUNTER — Ambulatory Visit (INDEPENDENT_AMBULATORY_CARE_PROVIDER_SITE_OTHER): Payer: PPO | Admitting: Internal Medicine

## 2017-02-21 ENCOUNTER — Encounter: Payer: Self-pay | Admitting: Internal Medicine

## 2017-02-21 VITALS — BP 98/64 | HR 74 | Ht 68.0 in | Wt 166.0 lb

## 2017-02-21 DIAGNOSIS — Z1211 Encounter for screening for malignant neoplasm of colon: Secondary | ICD-10-CM | POA: Diagnosis not present

## 2017-02-21 DIAGNOSIS — T451X5A Adverse effect of antineoplastic and immunosuppressive drugs, initial encounter: Secondary | ICD-10-CM | POA: Diagnosis not present

## 2017-02-21 DIAGNOSIS — G620 Drug-induced polyneuropathy: Secondary | ICD-10-CM

## 2017-02-21 DIAGNOSIS — K5900 Constipation, unspecified: Secondary | ICD-10-CM | POA: Diagnosis not present

## 2017-02-21 NOTE — Patient Instructions (Signed)
Please call us in late July, early August to set up your screening colonoscopy for September 2018.   I appreciate the opportunity to care for you. Silvano Rusk, MD, [facility]

## 2017-02-21 NOTE — Progress Notes (Signed)
Nicholas Wilkerson 76 y.o. [birth year] [id number]  Assessment & Plan:   Encounter Diagnoses  Name Primary?  . Constipation - mild Yes  . Neuropathy due to chemotherapeutic drug ([employer])   . Colon cancer screening     I think he has mild constipation and associated with cisplatin neurotoxicity. We will pursue a screening colonoscopy later this year probably in September. He will call us in August to set that up. He had a rectal exam with no mass recently through primary care. Again this is mild is no progressive change and it clearly related to the timing of his chemotherapy. Additionally neither one of his PET scan showed any activity in the colon.  I appreciate the opportunity to care for this patient. CC: Laurey Morale, MD   Subjective:   Chief Complaint:Constipation HPI The patient is a very nice elderly white man with a history of constipation ever since taking chemotherapy for his tongue cancer. This is mild and relieved by a stool softener. It was somewhat different so he wanted to discuss it. His last colonoscopy was in November 2008 that was negative for neoplasia. He does not see any bleeding. He did not have this problem until he took chemotherapy for the tongue cancer. He also has neuropathy in his feet since then. There is no progressive worsening change in stool caliber etc. At his physical examination December 2017 he had a normal rectal exam. Hemoccult negative stool.  No Known Allergies Current Meds  Medication Sig  . aspirin EC 81 MG tablet Take 81 mg by mouth daily.  . carvedilol (COREG) 25 MG tablet Take 1 tablet (25 mg total) by mouth 2 (two) times daily with a meal. KEEP OV.  Marland Kitchen diclofenac (VOLTAREN) 75 MG EC tablet Take 1 tablet (75 mg total) by mouth 2 (two) times daily.  . quinapril (ACCUPRIL) 10 MG tablet Take 1 tablet (10 mg total) by mouth daily.  . sodium fluoride (FLUORISHIELD) 1.1 % GEL dental gel Instill one drop of gel per tooth space of fluoride tray.  Place over teeth for 5 minutes. Remove. Spit out excess. Repeat nightly.  . tadalafil (CIALIS) 20 MG tablet Take 1 tablet (20 mg total) by mouth daily as needed for erectile dysfunction.  . tamsulosin (FLOMAX) 0.4 MG CAPS capsule Take 1 capsule (0.4 mg total) by mouth daily.  . TRAVATAN Z 0.004 % SOLN ophthalmic solution Place 1 drop into both eyes at bedtime.   . traZODone (DESYREL) 50 MG tablet Take 1 tablet (50 mg total) by mouth at bedtime.   Past Medical History:  Diagnosis Date  . ARTHRITIS 10/22/2008  . BPH (benign prostatic hyperplasia)   . CHF (congestive heart failure) [facility])    sees Dr. Peter [name]   . Dilated cardiomyopathy ([place])   . DIVERTICULOSIS, COLON 12/08/2007  . Glaucoma   . H/O asbestos exposure   . History of echocardiogram 05/22/2007   EF was 45-50% / Mild concentric LV hypertrophy with mild global hypokinesis and overall mild systolic dysfunction .  Mild AV sclerosis / Mild Mitral insufficiency / compared to prior study 04/24/02 -- LV function has improved further.    . Hypercholesterolemia   . Hypertension   . Insomnia 06/11/2015  . Radiation 01/01/15-02/18/15   base of tongue and bilateral neck 70 Gy  . Skin cancer    squamous cell, basal cell  . Squamous cell carcinoma 11/20/14   base of tongue primary  . Throat cancer ([place]) 10/2014   had chemo  Past Surgical History:  Procedure Laterality Date  . CARDIAC CATHETERIZATION  10/17/2001   EF estimated at 30% / moderate LV enlargement  / 1. Minimal nonobstructive atherosclerotic coronary artery disease / 2. Severe LV dysfunction with global hypokinesia consistent with dilated nonischemic cardiomyopath / 3. Moderate pulmonary hypertension  . COLONOSCOPY  08-15-07   per Dr. Carlean Purl, clear , repeat in 10 yrs  . LYMPH NODE BIOPSY     Social History   Social History  . Marital status: Married    Spouse name: N/A  . Number of children: 4  . Years of education: N/A   Occupational History  . 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 Smoking status: Never Smoker  . Smokeless tobacco: Never Used  . Alcohol use No  . Drug use: No   Social History Narrative   Married, retired Pension scheme manager 4 daughters, one of whom is a Marine scientist   1 coffee daily   02/21/2017      family history includes Angina in his mother; Cancer in his father; Stroke in his father.  Review of Systems All other review of systems are negative. He remains active. There is some residual symptomatology in the oral and pharyngeal cavity related to his treatment but he tolerates that and is happy his cancer is cured at this point. All other review of systems are negative.  Objective:   Physical Exam @BP  98/64   Pulse 74   Ht 5\' 8"  (1.727 m)   Wt 166 lb (75.3 kg)   BMI 25.24 kg/m @  General:  Well-developed, well-nourished and in no acute distress Eyes:  anicteric. Lungs: Clear to auscultation bilaterally. Heart:  S1S2, no rubs, murmurs, gallops. Abdomen:  soft, non-tender, no hepatosplenomegaly, hernia, or mass and BS+.  Psych:  appropriate mood and  Affect.   Data Reviewed:  As per history of present illness Lab Results  Component Value Date   WBC 4.9 09/23/2016   HGB 15.8 09/23/2016   HCT 47.2 09/23/2016   MCV 91.7 09/23/2016   PLT 201.0 09/23/2016

## 2017-02-23 ENCOUNTER — Telehealth: Payer: Self-pay | Admitting: *Deleted

## 2017-02-23 NOTE — Telephone Encounter (Signed)
CALLED PATIENT TO REMIND OF FU ON 02-25-17 WITH DR. Isidore Moos @ 10 AM, SPOKE WITH PATIENT AND HE IS AWARE OF THIS APPT.

## 2017-02-25 ENCOUNTER — Encounter: Payer: Self-pay | Admitting: Radiation Oncology

## 2017-02-25 ENCOUNTER — Ambulatory Visit
Admission: RE | Admit: 2017-02-25 | Discharge: 2017-02-25 | Disposition: A | Discharge status: Home / Self Care | Payer: PPO | Source: Ambulatory Visit | Attending: Radiation Oncology | Admitting: Radiation Oncology

## 2017-02-25 VITALS — BP 136/93 | HR 72 | Temp 99.0°F | Ht 68.0 in | Wt 165.2 lb

## 2017-02-25 DIAGNOSIS — C01 Malignant neoplasm of base of tongue: Secondary | ICD-10-CM | POA: Diagnosis not present

## 2017-02-25 DIAGNOSIS — Z1329 Encounter for screening for other suspected endocrine disorder: Secondary | ICD-10-CM

## 2017-02-25 DIAGNOSIS — Z85810 Personal history of malignant neoplasm of tongue: Secondary | ICD-10-CM | POA: Diagnosis not present

## 2017-02-25 DIAGNOSIS — Z08 Encounter for follow-up examination after completed treatment for malignant neoplasm: Secondary | ICD-10-CM | POA: Diagnosis not present

## 2017-02-25 LAB — TSH: TSH: 2.148 m(IU)/L (ref 0.320–4.118)

## 2017-02-25 MED ORDER — LARYNGOSCOPY SOLUTION RAD-ONC
15.0000 mL | Freq: Once | TOPICAL | Status: AC
  Administered 2017-02-25: 15 mL via TOPICAL
  Filled 2017-02-25: qty 15

## 2017-02-25 NOTE — Progress Notes (Signed)
Radiation Oncology         [phone] ________________________________  Name: Nicholas Wilkerson MRN: [id number]  Date: 02/25/2017  DOB: [birth year]  Follow-Up Visit Note  CC: Laurey Morale, MD  Melissa Montane, MD  Diagnosis and Prior Radiotherapy:       ICD-9-CM ICD-10-CM   1. Cancer of base of tongue (HCC) 141.0 C01 laryngocopy solution for Rad-Onc     Fiberoptic laryngoscopy  2. Screening for hypothyroidism V77.0 Z13.29 TSH     TSH   T2N2aM0 Stage IVA Right Base of Tongue Squamous Cell Carcinoma   (AJCC 7th ed staging) 01/01/15 - 02/18/15 : Base of Tongue treated to 70 Gy in 35 fractions   Narrative: Nicholas Wilkerson is a 76 year old male presenting to clinic for follow up of radiation completed on 02/18/15.  On review of systems, the patient denies pain. He is not using a feeding tube at this time. He reports he is swallowing well, and does not require additional water to help with swallowing. The patient denies any difficulties breathing. He denies any palpable masses. The patient reports several previous episodes of constipation, but this has resolved recently with dietary adjustments.  He denies using tobacco products. He is using daily fluoride trays. The patient's last ENT visit was 12/23/16 with Dr. Janace Hoard.   ALLERGIES:  has No Known Allergies.  Meds: Current Outpatient Prescriptions  Medication Sig Dispense Refill  . aspirin EC 81 MG tablet Take 81 mg by mouth daily.    . carvedilol (COREG) 25 MG tablet Take 1 tablet (25 mg total) by mouth 2 (two) times daily with a meal. KEEP OV. 180 tablet 3  . diclofenac (VOLTAREN) 75 MG EC tablet Take 1 tablet (75 mg total) by mouth 2 (two) times daily. 180 tablet 3  . quinapril (ACCUPRIL) 10 MG tablet Take 1 tablet (10 mg total) by mouth daily. 90 tablet 3  . sodium fluoride (FLUORISHIELD) 1.1 % GEL dental gel Instill one drop of gel per tooth space of fluoride tray. Place over teeth for 5 minutes. Remove. Spit out excess. Repeat nightly.  120 mL prn  . tamsulosin (FLOMAX) 0.4 MG CAPS capsule Take 1 capsule (0.4 mg total) by mouth daily. 90 capsule 1  . TRAVATAN Z 0.004 % SOLN ophthalmic solution Place 1 drop into both eyes at bedtime.     . traZODone (DESYREL) 50 MG tablet Take 1 tablet (50 mg total) by mouth at bedtime. 90 tablet 3  . brimonidine (ALPHAGAN) 0.2 % ophthalmic solution     . latanoprost (XALATAN) 0.005 % ophthalmic solution     . tadalafil (CIALIS) 20 MG tablet Take 1 tablet (20 mg total) by mouth daily as needed for erectile dysfunction. (Patient not taking: Reported on 02/25/2017) 30 tablet 3   No current facility-administered medications for this encounter.     Physical Findings: The patient is in no acute distress. Patient is alert and oriented. Wt Readings from Last 3 Encounters:  02/25/17 165 lb 3.2 oz (74.9 kg)  02/21/17 166 lb (75.3 kg)  10/15/16 165 lb 12.8 oz (75.2 kg)    height is 5\' 8"  (1.727 m) and weight is 165 lb 3.2 oz (74.9 kg). His temperature is 99 F (37.2 C). His blood pressure is 136/93 (abnormal) and his pulse is 72. His oxygen saturation is 99%.   General: Alert and oriented, in no acute distress. HEENT: No lesions noted in the oropharynx.  Neck: Neck is supple, no palpable cervical or supraclavicular lymphadenopathy.  Heart: Regular in rate and rhythm with no murmurs. Chest: Clear to auscultation bilaterally. Abdomen: soft, non tender, non distended. Extremities: No edema. Lymphatics: see Neck Exam Psychiatric: Judgment and insight are intact. Affect is appropriate.  PROCEDURE NOTE: After obtaining consent and anesthetizing the nasal cavity with topical lidocaine and phenylephrine, the flexible endoscope was introduced and passed through the nasal cavity. No lesions appreciated in the pharynx or larynx. The cords are clear and symmetrically mobile.  Lab Findings: Lab Results  Component Value Date   WBC 4.9 09/23/2016   HGB 15.8 09/23/2016   HCT 47.2 09/23/2016   MCV 91.7  09/23/2016   PLT 201.0 09/23/2016    Lab Results  Component Value Date   TSH 2.148 02/25/2017    Radiographic Findings: No results found.  Impression/Plan:   1) Head and Neck Cancer Status: NED  2) Nutritional Status: no issues  3)  Swallowing: no complaints  4)  Dental: He is compliant with fluoride rinses. He is still seeing his dentist.  5) Thyroid function: good when checked today. Continue annual TSH with med/onc Lab Results  Component Value Date   TSH 2.148 02/25/2017     6) Social: He is supported by his wife.   7) Other:   No evidence of disease 2 years out from ChemoRadiation. Recommended follow up with otolaryngology in 6 months and medical oncology in 1 year. I will see him back as needed. Anticipate yearly follow ups with Dr. Alvy Bimler until he is 5 years out from completion of his treatment.  I will order TSH today, and I will order one for a year from now to be done at his appointment with Dr. Alvy Bimler.  Patient was told that he needs to get TSH checks at the [facility] or with his PCP on a yearly basis for the rest of his life due to risk of hypothyroidism from cancer treatments.   _____________________________   Eppie Gibson, MD  This document serves as a record of services personally performed by Eppie Gibson, MD. It was created on her behalf by Maryla Morrow, a trained medical scribe. The creation of this record is based on the scribe's personal observations and the provider's statements to them. This document has been checked and approved by the attending provider.

## 2017-02-28 ENCOUNTER — Telehealth: Payer: Self-pay | Admitting: *Deleted

## 2017-02-28 NOTE — Telephone Encounter (Signed)
Oncology Nurse Navigator Documentation  Per Dr. Pearlie Oyster guidance, called [facility] ENT to arrange appointment.  Spoke with Laqueta Linden, requested patient be contacted and routine post-RT follow-up appt arranged with Dr. Janace Hoard in 6 months.    She verbalized understanding.  Gayleen Orem, RN, BSN, [place] [facility] Oncology Nurse [place] [facility] [place] [phone]

## 2017-03-10 ENCOUNTER — Telehealth: Payer: Self-pay | Admitting: Hematology and Oncology

## 2017-03-10 NOTE — Telephone Encounter (Signed)
Left message re May 2019 appointments and mailed schedule.

## 2017-04-04 ENCOUNTER — Encounter: Payer: Self-pay | Admitting: *Deleted

## 2017-04-04 ENCOUNTER — Other Ambulatory Visit: Payer: Self-pay | Admitting: *Deleted

## 2017-04-04 NOTE — Patient Outreach (Signed)
HTA THN Screen: Pt sees primary care MD routinely.Sees cardiologist twice a year. Married. Very active plays golf and extensive yard work. Daughter is a Marine scientist. Takes medications as ordered for chronic problems.  No ongoing problems with HF. Weighs a couple of times a week. His weight is stable at 160. He is aware of the red flags for HF exacerbation, weight gain, SOB, edema. No acute health concerns. No care management needs. Introductory letter sent.  Eulah Pont. Myrtie Neither, MSN, [facility] Gerontological Nurse Practitioner [facility] Care Management [phone]

## 2017-05-25 ENCOUNTER — Encounter: Payer: Self-pay | Admitting: Internal Medicine

## 2017-05-31 ENCOUNTER — Telehealth: Payer: Self-pay

## 2017-05-31 NOTE — Telephone Encounter (Signed)
Janis,  This pt is cleared for anesthetic care at [facility].  Thanks,  Mylan Schwarz 

## 2017-05-31 NOTE — Telephone Encounter (Signed)
Nicholas Wilkerson,  This pt is scheduled for his colon with Dr Carlean Purl on 06/23/17. After reviewing his chart, his Echo done on 11/03/16 shows EF 35%-40% with changes. Can you please review his echo and advise if he can be done in [place]. Thanks for your time. [name]

## 2017-06-09 ENCOUNTER — Encounter: Payer: Self-pay | Admitting: Internal Medicine

## 2017-06-09 ENCOUNTER — Ambulatory Visit (AMBULATORY_SURGERY_CENTER): Payer: Self-pay | Admitting: *Deleted

## 2017-06-09 VITALS — Ht 69.0 in | Wt 166.4 lb

## 2017-06-09 DIAGNOSIS — Z1211 Encounter for screening for malignant neoplasm of colon: Secondary | ICD-10-CM

## 2017-06-09 NOTE — Progress Notes (Signed)
Denies allergies to eggs or soy products. Denies complications with sedation or anesthesia. Denies O2 use. Denies use of diet or weight loss medications.  Emmi instructions not given for colonoscopy, pt does not use.

## 2017-06-23 ENCOUNTER — Ambulatory Visit (AMBULATORY_SURGERY_CENTER): Payer: PPO | Admitting: Internal Medicine

## 2017-06-23 ENCOUNTER — Encounter: Payer: Self-pay | Admitting: Internal Medicine

## 2017-06-23 VITALS — BP 106/59 | HR 57 | Temp 98.9°F | Resp 13 | Ht 69.0 in | Wt 166.0 lb

## 2017-06-23 DIAGNOSIS — Z1211 Encounter for screening for malignant neoplasm of colon: Secondary | ICD-10-CM

## 2017-06-23 DIAGNOSIS — I509 Heart failure, unspecified: Secondary | ICD-10-CM | POA: Diagnosis not present

## 2017-06-23 DIAGNOSIS — I429 Cardiomyopathy, unspecified: Secondary | ICD-10-CM | POA: Diagnosis not present

## 2017-06-23 HISTORY — PX: COLONOSCOPY: SHX174

## 2017-06-23 MED ORDER — SODIUM CHLORIDE 0.9 % IV SOLN: 500.0000 mL | INTRAVENOUS | Status: DC

## 2017-06-23 NOTE — Progress Notes (Signed)
Report given to PACU, vss 

## 2017-06-23 NOTE — Progress Notes (Signed)
Pt's states no medical or surgical changes since previsit or office visit. 

## 2017-06-23 NOTE — Op Note (Signed)
[place] Patient Name: Nicholas Wilkerson Procedure Date: 06/23/2017 3:15 PM MRN: [id number] Endoscopist: Gatha Mayer , MD Age: 76 Referring MD:  Date of Birth: [birth year] Gender: Male Account #: [id number] Procedure:                Colonoscopy Indications:              Screening for colorectal malignant neoplasm Medicines:                Propofol per Anesthesia, Monitored Anesthesia Care Procedure:                Pre-Anesthesia Assessment:                           - Prior to the procedure, a History and Physical                            was performed, and patient medications and                            allergies were reviewed. The patient's tolerance of                            previous anesthesia was also reviewed. The risks                            and benefits of the procedure and the sedation                            options and risks were discussed with the patient.                            All questions were answered, and informed consent                            was obtained. Prior Anticoagulants: The patient has                            taken no previous anticoagulant or antiplatelet                            agents. ASA Grade Assessment: III - A patient with                            severe systemic disease. After reviewing the risks                            and benefits, the patient was deemed in                            satisfactory condition to undergo the procedure.                           After obtaining informed consent, the colonoscope  was passed under direct vision. Throughout the                            procedure, the patient's blood pressure, pulse, and                            oxygen saturations were monitored continuously. The                            Colonoscope was introduced through the anus and                            advanced to the the cecum, identified by   appendiceal orifice and ileocecal valve. The                            quality of the bowel preparation was excellent. The                            colonoscopy was performed without difficulty. The                            patient tolerated the procedure well. The bowel                            preparation used was Miralax. The ileocecal valve,                            appendiceal orifice, and rectum were photographed. Scope In: 3:38:15 PM Scope Out: 3:46:35 PM Scope Withdrawal Time: 0 hours 5 minutes 58 seconds  Total Procedure Duration: 0 hours 8 minutes 20 seconds  Findings:                 The perianal and digital rectal examinations were                            normal. Pertinent negatives include normal prostate                            (size, shape, and consistency).                           The colon (entire examined portion) appeared normal.                           No additional abnormalities were found on                            retroflexion. Complications:            No immediate complications. Estimated blood loss:                            None. Estimated Blood Loss:     Estimated blood loss: none. Impression:               -  The entire examined colon is normal.                           - No specimens collected. Recommendation:           - Patient has a contact number available for                            emergencies. The signs and symptoms of potential                            delayed complications were discussed with the                            patient. Return to normal activities tomorrow.                            Written discharge instructions were provided to the                            patient.                           - Resume previous diet.                           - Continue present medications.                           - No repeat colonoscopy due to age and the absence                            of colonic polyps. Gatha Mayer, MD 06/23/2017 3:49:53 PM This report has been signed electronically.

## 2017-06-23 NOTE — Patient Instructions (Addendum)
   Normal colon - again!  Come back and see me if you need to but no more routine colonoscopy tests needed.  I appreciate the opportunity to care for you.  Gatha Mayer, MD, FACG YOU HAD AN ENDOSCOPIC PROCEDURE TODAY AT [facility]:   Refer to the procedure report that was given to you for any specific questions about what was found during the examination.  If the procedure report does not answer your questions, please call your gastroenterologist to clarify.  If you requested that your care partner not be given the details of your procedure findings, then the procedure report has been included in a sealed envelope for you to review at your convenience later.  YOU SHOULD EXPECT: Some feelings of bloating in the abdomen. Passage of more gas than usual.  Walking can help get rid of the air that was put into your GI tract during the procedure and reduce the bloating. If you had a lower endoscopy (such as a colonoscopy or flexible sigmoidoscopy) you may notice spotting of blood in your stool or on the toilet paper. If you underwent a bowel prep for your procedure, you may not have a normal bowel movement for a few days.  Please Note:  You might notice some irritation and congestion in your nose or some drainage.  This is from the oxygen used during your procedure.  There is no need for concern and it should clear up in a day or so.  SYMPTOMS TO REPORT IMMEDIATELY:   Following lower endoscopy (colonoscopy or flexible sigmoidoscopy):  Excessive amounts of blood in the stool  Significant tenderness or worsening of abdominal pains  Swelling of the abdomen that is new, acute  Fever of 100F or higher   For urgent or emergent issues, a gastroenterologist can be reached at any hour by calling [phone].   DIET:  We do recommend a small meal at first, but then you may proceed to your regular diet.  Drink plenty of fluids but you should avoid alcoholic beverages for 24  hours.  ACTIVITY:  You should plan to take it easy for the rest of today and you should NOT DRIVE or use heavy machinery until tomorrow (because of the sedation medicines used during the test).    FOLLOW UP: Our staff will call the number listed on your records the next business day following your procedure to check on you and address any questions or concerns that you may have regarding the information given to you following your procedure. If we do not reach you, we will leave a message.  However, if you are feeling well and you are not experiencing any problems, there is no need to return our call.  We will assume that you have returned to your regular daily activities without incident.  If any biopsies were taken you will be contacted by phone or by letter within the next 1-3 weeks.  Please call us at [phone] if you have not heard about the biopsies in 3 weeks.    SIGNATURES/CONFIDENTIALITY: You and/or your care partner have signed paperwork which will be entered into your electronic medical record.  These signatures attest to the fact that that the information above on your After Visit Summary has been reviewed and is understood.  Full responsibility of the confidentiality of this discharge information lies with you and/or your care-partner.  Thank you for letting us take care of your healthcare needs today! :)

## 2017-06-24 ENCOUNTER — Telehealth: Payer: Self-pay | Admitting: *Deleted

## 2017-06-24 NOTE — Telephone Encounter (Signed)
  Follow up Call-  Call back number 06/23/2017  Post procedure Call Back phone  # [phone]  Permission to leave phone message Yes  Some recent data might be hidden   spoke with wife  Patient questions:  Do you have a fever, pain , or abdominal swelling? No. Pain Score  0 *  Have you tolerated food without any problems? Yes.    Have you been able to return to your normal activities? Yes.    Do you have any questions about your discharge instructions: Diet   No. Medications  No. Follow up visit  No.  Do you have questions or concerns about your Care? No.  Actions: * If pain score is 4 or above: No action needed, pain <4.

## 2017-06-24 NOTE — Telephone Encounter (Signed)
  Follow up Call-  Call back number 06/23/2017  Post procedure Call Back phone  # [phone]  Permission to leave phone message Yes  Some recent data might be hidden     Patient questions:  Message left to call us if necessary.

## 2017-06-25 ENCOUNTER — Other Ambulatory Visit: Payer: Self-pay | Admitting: Family Medicine

## 2017-06-27 DIAGNOSIS — H401131 Primary open-angle glaucoma, bilateral, mild stage: Secondary | ICD-10-CM | POA: Diagnosis not present

## 2017-06-30 ENCOUNTER — Encounter: Payer: Self-pay | Admitting: Family Medicine

## 2017-07-18 ENCOUNTER — Ambulatory Visit (INDEPENDENT_AMBULATORY_CARE_PROVIDER_SITE_OTHER): Payer: PPO | Admitting: *Deleted

## 2017-07-18 DIAGNOSIS — Z23 Encounter for immunization: Secondary | ICD-10-CM | POA: Diagnosis not present

## 2017-07-27 DIAGNOSIS — L570 Actinic keratosis: Secondary | ICD-10-CM | POA: Diagnosis not present

## 2017-07-27 DIAGNOSIS — D3617 Benign neoplasm of peripheral nerves and autonomic nervous system of trunk, unspecified: Secondary | ICD-10-CM | POA: Diagnosis not present

## 2017-07-27 DIAGNOSIS — L821 Other seborrheic keratosis: Secondary | ICD-10-CM | POA: Diagnosis not present

## 2017-07-27 DIAGNOSIS — L814 Other melanin hyperpigmentation: Secondary | ICD-10-CM | POA: Diagnosis not present

## 2017-07-27 DIAGNOSIS — D692 Other nonthrombocytopenic purpura: Secondary | ICD-10-CM | POA: Diagnosis not present

## 2017-07-27 DIAGNOSIS — D1801 Hemangioma of skin and subcutaneous tissue: Secondary | ICD-10-CM | POA: Diagnosis not present

## 2017-08-31 DIAGNOSIS — L570 Actinic keratosis: Secondary | ICD-10-CM | POA: Diagnosis not present

## 2017-09-05 DIAGNOSIS — Z85810 Personal history of malignant neoplasm of tongue: Secondary | ICD-10-CM | POA: Diagnosis not present

## 2017-09-27 ENCOUNTER — Ambulatory Visit (INDEPENDENT_AMBULATORY_CARE_PROVIDER_SITE_OTHER): Payer: PPO | Admitting: Family Medicine

## 2017-09-27 ENCOUNTER — Encounter: Payer: Self-pay | Admitting: Family Medicine

## 2017-09-27 VITALS — BP 120/80 | HR 70 | Temp 97.7°F | Ht 67.75 in | Wt 167.6 lb

## 2017-09-27 DIAGNOSIS — Z0000 Encounter for general adult medical examination without abnormal findings: Secondary | ICD-10-CM

## 2017-09-27 DIAGNOSIS — R972 Elevated prostate specific antigen [PSA]: Secondary | ICD-10-CM | POA: Diagnosis not present

## 2017-09-27 DIAGNOSIS — R7989 Other specified abnormal findings of blood chemistry: Secondary | ICD-10-CM | POA: Diagnosis not present

## 2017-09-27 DIAGNOSIS — Z125 Encounter for screening for malignant neoplasm of prostate: Secondary | ICD-10-CM | POA: Diagnosis not present

## 2017-09-27 LAB — CBC WITH DIFFERENTIAL/PLATELET
BASOS ABS: 0.1 10*3/uL (ref 0.0–0.1)
Basophils Relative: 1 % (ref 0.0–3.0)
EOS ABS: 0.1 10*3/uL (ref 0.0–0.7)
Eosinophils Relative: 1.6 % (ref 0.0–5.0)
HCT: 49.5 % (ref 39.0–52.0)
Hemoglobin: 16.1 g/dL (ref 13.0–17.0)
LYMPHS ABS: 0.8 10*3/uL (ref 0.7–4.0)
Lymphocytes Relative: 16.9 % (ref 12.0–46.0)
MCHC: 32.6 g/dL (ref 30.0–36.0)
MCV: 95.3 fl (ref 78.0–100.0)
MONO ABS: 0.5 10*3/uL (ref 0.1–1.0)
Monocytes Relative: 9.2 % (ref 3.0–12.0)
NEUTROS ABS: 3.6 10*3/uL (ref 1.4–7.7)
NEUTROS PCT: 71.3 % (ref 43.0–77.0)
PLATELETS: 167 10*3/uL (ref 150.0–400.0)
RBC: 5.2 Mil/uL (ref 4.22–5.81)
RDW: 14.1 % (ref 11.5–15.5)
WBC: 5 10*3/uL (ref 4.0–10.5)

## 2017-09-27 LAB — POC URINALSYSI DIPSTICK (AUTOMATED)
BILIRUBIN UA: NEGATIVE
Glucose, UA: NEGATIVE
Ketones, UA: NEGATIVE
LEUKOCYTES UA: NEGATIVE
NITRITE UA: NEGATIVE
PH UA: 6 (ref 5.0–8.0)
PROTEIN UA: NEGATIVE
RBC UA: NEGATIVE
Spec Grav, UA: 1.025 (ref 1.010–1.025)
UROBILINOGEN UA: 0.2 U/dL

## 2017-09-27 LAB — BASIC METABOLIC PANEL
BUN: 19 mg/dL (ref 6–23)
CHLORIDE: 104 meq/L (ref 96–112)
CO2: 31 mEq/L (ref 19–32)
CREATININE: 1.13 mg/dL (ref 0.40–1.50)
Calcium: 8.9 mg/dL (ref 8.4–10.5)
GFR: 67.41 mL/min (ref >60.00)
Glucose, Bld: 98 mg/dL (ref 70–99)
POTASSIUM: 4 meq/L (ref 3.5–5.1)
Sodium: 141 mEq/L (ref 135–145)

## 2017-09-27 LAB — LIPID PANEL
CHOL/HDL RATIO: 4
Cholesterol: 191 mg/dL (ref 0–200)
HDL: 43.5 mg/dL (ref >39.00)
LDL CALC: 125 mg/dL — AB (ref 0–99)
NONHDL: 147.36
Triglycerides: 112 mg/dL (ref 0.0–149.0)
VLDL: 22.4 mg/dL (ref 0.0–40.0)

## 2017-09-27 LAB — HEPATIC FUNCTION PANEL
ALT: 19 U/L (ref 0–53)
AST: 16 U/L (ref 0–37)
Albumin: 3.9 g/dL (ref 3.5–5.2)
Alkaline Phosphatase: 53 U/L (ref 39–117)
Bilirubin, Direct: 0.1 mg/dL (ref 0.0–0.3)
Total Bilirubin: 0.6 mg/dL (ref 0.2–1.2)
Total Protein: 6.1 g/dL (ref 6.0–8.3)

## 2017-09-27 LAB — PSA: PSA: 4.16 ng/mL — AB (ref 0.10–4.00)

## 2017-09-27 LAB — TSH: TSH: 4.65 u[IU]/mL — AB (ref 0.35–4.50)

## 2017-09-27 NOTE — Addendum Note (Signed)
Addended by: Alysia Penna A on: 09/27/2017 05:08 PM   Modules accepted: Orders

## 2017-09-27 NOTE — Progress Notes (Signed)
   Subjective:    Patient ID: Nicholas Wilkerson, male    DOB: [birth year], 76 y.o.   MRN: [id number]  HPI Here for a well exam. He feels great. His glaucoma is well controlled.    Review of Systems  Constitutional: Negative.   HENT: Negative.   Eyes: Negative.   Respiratory: Negative.   Cardiovascular: Negative.   Gastrointestinal: Negative.   Genitourinary: Negative.   Musculoskeletal: Negative.   Skin: Negative.   Neurological: Negative.   Psychiatric/Behavioral: Negative.        Objective:   Physical Exam  Constitutional: He is oriented to person, place, and time. He appears well-developed and well-nourished. No distress.  HENT:  Head: Normocephalic and atraumatic.  Right Ear: External ear normal.  Left Ear: External ear normal.  Nose: Nose normal.  Mouth/Throat: Oropharynx is clear and moist. No oropharyngeal exudate.  Eyes: Conjunctivae and EOM are normal. Pupils are equal, round, and reactive to light. Right eye exhibits no discharge. Left eye exhibits no discharge. No scleral icterus.  Neck: Neck supple. No JVD present. No tracheal deviation present. No thyromegaly present.  Cardiovascular: Normal rate, regular rhythm, normal heart sounds and intact distal pulses. Exam reveals no gallop and no friction rub.  No murmur heard. Pulmonary/Chest: Effort normal and breath sounds normal. No respiratory distress. He has no wheezes. He has no rales. He exhibits no tenderness.  Abdominal: Soft. Bowel sounds are normal. He exhibits no distension and no mass. There is no tenderness. There is no rebound and no guarding.  Genitourinary: Rectum normal, prostate normal and penis normal. Rectal exam shows guaiac negative stool. No penile tenderness.  Musculoskeletal: Normal range of motion. He exhibits no edema or tenderness.  Lymphadenopathy:    He has no cervical adenopathy.  Neurological: He is alert and oriented to person, place, and time. He has normal reflexes. No cranial nerve  deficit. He exhibits normal muscle tone. Coordination normal.  Skin: Skin is warm and dry. No rash noted. He is not diaphoretic. No erythema. No pallor.  Psychiatric: He has a normal mood and affect. His behavior is normal. Judgment and thought content normal.          Assessment & Plan:  Well exam. We discussed diet and exercise. Get fasting labs. Alysia Penna, MD

## 2017-10-18 ENCOUNTER — Other Ambulatory Visit: Payer: Self-pay | Admitting: Cardiology

## 2017-10-20 NOTE — Progress Notes (Addendum)
Subjective:   Nicholas Wilkerson is a 77 y.o. male who presents for an Initial Medicare Annual Wellness Visit.  Reports health as great Has 2 75 yo grand children and enjoys them Married   Medical hx; recent squamous cell of tongue/ Radiation  Dr. [place] for vision checks Dr. Peter [name] for cardiology Very active   Diet- very active lifestyle and report diet is good and nutritious   HDL 43; trig 112; LDL 125; ratio 4  BMI 25   Exercise Golf frequently when able  Active biking with family  Keeps his yard up  Does for his volunteers   Hx of athleticism; very strong     ETOH; occasional beer but not often  Tobacco: Never smoked   There are no preventive care reminders to display for this patient.  PSA 09/2017 4.16  Will repeat with PSA with Dr. Sarajane Jews and has discussed with him  Colonoscopy 06/2017     Objective:    Today's Vitals   10/21/17 0858  BP: 124/80  Pulse: 74  SpO2: 98%  Weight: 167 lb 7 oz (75.9 kg)  Height: 5' 8.5" (1.74 m)   Body mass index is 25.09 kg/m.  Advanced Directives 10/21/2017 02/25/2017 12/19/2015 08/18/2015 07/17/2015 07/14/2015 06/27/2015  Does Patient Have a Medical Advance Directive? Yes Yes No Yes - Yes Yes  Type of Advance Directive - [place];Living will [place];Living will [place];Living will - [place];Living will [place];Living will  Does patient want to make changes to medical advance directive? - - - No - Patient declined - No - Patient declined -  Copy of [place] in Chart? - Yes Yes No - copy requested No - copy requested No - copy requested -  Would patient like information on creating a medical advance directive? - - - - - - -    Current Medications (verified) Outpatient Encounter Medications as of 10/21/2017  Medication Sig  . aspirin EC 81 MG tablet Take 81 mg by mouth daily.  . brimonidine (ALPHAGAN) 0.2  % ophthalmic solution   . carvedilol (COREG) 25 MG tablet Take 1 tablet (25 mg total) by mouth 2 (two) times daily with a meal. KEEP OV.  Marland Kitchen diclofenac (VOLTAREN) 75 MG EC tablet Take 1 tablet (75 mg total) by mouth 2 (two) times daily.  Marland Kitchen latanoprost (XALATAN) 0.005 % ophthalmic solution   . quinapril (ACCUPRIL) 10 MG tablet TAKE ONE TABLET BY MOUTH ONCE DAILY  . sodium fluoride (FLUORISHIELD) 1.1 % GEL dental gel Instill one drop of gel per tooth space of fluoride tray. Place over teeth for 5 minutes. Remove. Spit out excess. Repeat nightly.  . tadalafil (CIALIS) 20 MG tablet Take 1 tablet (20 mg total) by mouth daily as needed for erectile dysfunction.  . tamsulosin (FLOMAX) 0.4 MG CAPS capsule TAKE 1 CAPSULE BY MOUTH ONCE DAILY  . traZODone (DESYREL) 50 MG tablet Take 1 tablet (50 mg total) by mouth at bedtime.   No facility-administered encounter medications on file as of 10/21/2017.     Allergies (verified) Patient has no known allergies.   History: Past Medical History:  Diagnosis Date  . ARTHRITIS 10/22/2008  . BPH (benign prostatic hyperplasia)   . CHF (congestive heart failure) [facility])    sees Dr. Peter [name]   . Dilated cardiomyopathy ([place])   . DIVERTICULOSIS, COLON 12/08/2007  . Glaucoma    sees Dr. Heather [place]   .  H/O asbestos exposure   . History of echocardiogram 05/22/2007   EF was 45-50% / Mild concentric LV hypertrophy with mild global hypokinesis and overall mild systolic dysfunction .  Mild AV sclerosis / Mild Mitral insufficiency / compared to prior study 04/24/02 -- LV function has improved further.    . History of kidney stones   . Hypercholesterolemia   . Hypertension   . Insomnia 06/11/2015  . Radiation 01/01/15-02/18/15   base of tongue and bilateral neck 70 Gy  . Skin cancer    squamous cell, basal cell  . Squamous cell carcinoma 11/20/14   base of tongue primary  . Throat cancer ([place]) 10/2014   had chemo   Past Surgical History:  Procedure Laterality  Date  . CARDIAC CATHETERIZATION  10/17/2001   EF estimated at 30% / moderate LV enlargement  / 1. Minimal nonobstructive atherosclerotic coronary artery disease / 2. Severe LV dysfunction with global hypokinesia consistent with dilated nonischemic cardiomyopath / 3. Moderate pulmonary hypertension  . COLONOSCOPY  06/23/2017   per Dr. Carlean Purl, clear, no repeats needed   . LYMPH NODE BIOPSY     Family History  Problem Relation Age of Onset  . Stroke Father   . Cancer Father        kidney ca  . Angina Mother   . Colon cancer Neg Hx   . Esophageal cancer Neg Hx   . Rectal cancer Neg Hx   . Stomach cancer Neg Hx    Social History   Socioeconomic History  . Marital status: Married    Spouse name: Not on file  . Number of children: 4  . Years of education: Not on file  . Highest education level: Not on file  Social Needs  . Financial resource strain: Not on file  . Food insecurity - worry: Not on file  . Food insecurity - inability: Not on file  . Transportation needs - medical: Not on file  . Transportation needs - non-medical: Not on file  Occupational History  . Occupation: Industrial/product designer: RETIRED    Comment: retired  Tobacco Use  . Smoking status: Never Smoker  . Smokeless tobacco: Never Used  Substance and Sexual Activity  . Alcohol use: Yes    Alcohol/week: 0.0 oz    Comment: occasional beer, a couple times a month  . Drug use: No  . Sexual activity: Not on file    Comment: retired Pension scheme manager, married  Other Topics Concern  . Not on file  Social History Narrative   Married, retired Pension scheme manager 4 daughters, one of whom is a Marine scientist   1 coffee daily   02/21/2017   Tobacco Counseling Counseling given: Yes   Clinical Intake:   Activities of Daily Living In your present state of health, do you have any difficulty performing the following activities: 10/21/2017  Hearing? N  Vision? N  Difficulty concentrating or making decisions? N  Walking or  climbing stairs? N  Dressing or bathing? N  Doing errands, shopping? N  Preparing Food and eating ? N  Using the Toilet? Y  In the past six months, have you accidently leaked urine? N  Do you have problems with loss of bowel control? N  Managing your Medications? N  Managing your Finances? N  Housekeeping or managing your Housekeeping? N  Some recent data might be hidden     Immunizations and Health Maintenance Immunization History  Administered Date(s) Administered  . Influenza Split 08/11/2011,  08/31/2012  . Influenza Whole 08/18/2007, 07/11/2008, 08/12/2009, 06/25/2010  . Influenza, High Dose Seasonal PF 07/31/2013, 07/01/2016, 07/18/2017  . Influenza,inj,Quad PF,6+ Mos 07/31/2014, 06/13/2015  . Pneumococcal Conjugate-13 09/23/2016  . Pneumococcal Polysaccharide-23 06/19/2009  . Td 06/27/2008  . Zoster 07/11/2008   There are no preventive care reminders to display for this patient.  Patient Care Team: Laurey Morale, MD as PCP - General (Family Medicine) Heath Lark, MD as Consulting Physician (Hematology and Oncology) Melissa Montane, MD as Consulting Physician (Otolaryngology) Eppie Gibson, MD as Attending Physician (Radiation Oncology) Holley Bouche, NP as Nurse Practitioner (Nurse Practitioner)  Indicate any recent Medical Services you may have received from other than [facility] providers in the past year (date may be approximate).    Assessment:   This is a routine wellness examination for Sl[place].  Hearing/Vision screen Vision Screening Comments: Vision checks by dr. [place] Heather [place]  Dietary issues and exercise activities discussed: Current Exercise Habits: Home exercise routine, Time (Minutes): 60, Frequency (Times/Week): 5, Weekly Exercise (Minutes/Week): 300, Intensity: Moderate  Goals    . Exercise 150 min/wk Moderate Activity     Do more exercise;  Keep walking weather permitted        Depression Screen PHQ 2/9 Scores 10/21/2017 04/04/2017 02/25/2017  12/19/2015  PHQ - 2 Score 0 0 0 0    Fall Risk Fall Risk  10/21/2017 02/25/2017 12/19/2015 06/20/2015 12/11/2014  Falls in the past year? No No No Yes No  Number falls in past yr: - - - 2 or more -  Injury with Fall? - - - No -   Physical when he worked   Museum/gallery conservator Function: MMSE - Mini Mental State Exam 10/21/2017  Not completed: (No Data)     Ad8 score reviewed for issues:  Issues making decisions:  Less interest in hobbies / activities:  Repeats questions, stories (family complaining):  Trouble using ordinary gadgets (microwave, computer, phone):  Forgets the month or year:   Mismanaging finances:   Remembering appts:  Daily problems with thinking and/or memory: Ad8 score is= 0      Screening Tests Health Maintenance  Topic Date Due  . TETANUS/TDAP  06/27/2018  . COLONOSCOPY  06/24/2027  . INFLUENZA VACCINE  Completed  . PNA vac Low Risk Adult  Completed    Qualifies for Shingles Vaccine?  Zoster 07/2008  Discussed Shingrix         Plan:      PCP Notes   Health Maintenance Discussed Tdap this year;  Educated on shingrix and he is on a waiting list  PSA elevated but following with Dr. Sarajane Jews for repeat   Abnormal Screens  None  Referrals  none  Patient concerns; None  Will fup on PSA   Nurse Concerns; As noted   Next PCP apt  09/27/2017; to be scheduled as needed      I have personally reviewed and noted the following in the patient's chart:   . Medical and social history . Use of alcohol, tobacco or illicit drugs  . Current medications and supplements . Functional ability and status . Nutritional status . Physical activity . Advanced directives . List of other physicians . Hospitalizations, surgeries, and ER visits in previous 12 months . Vitals . Screenings to include cognitive, depression, and falls . Referrals and appointments  In addition, I have reviewed and discussed with patient certain preventive protocols, quality  metrics, and best practice recommendations. A written personalized care plan for preventive services as well as  general preventive health recommendations were provided to patient.     OHCSP,ZZCKI, RN   10/21/2017    I have reviewed this note and agree with its contents.  Alysia Penna, MD

## 2017-10-21 ENCOUNTER — Ambulatory Visit (INDEPENDENT_AMBULATORY_CARE_PROVIDER_SITE_OTHER): Payer: PPO

## 2017-10-21 VITALS — BP 124/80 | HR 74 | Ht 68.5 in | Wt 167.4 lb

## 2017-10-21 DIAGNOSIS — Z0000 Encounter for general adult medical examination without abnormal findings: Secondary | ICD-10-CM

## 2017-10-21 NOTE — Patient Instructions (Addendum)
[name] , Thank you for taking time to come for your Medicare Wellness Visit. I appreciate your ongoing commitment to your health goals. Please review the following plan we discussed and let me know if I can assist you in the future.   Educated regarding the tdap;  A Tetanus is recommended every 10 years and due this year  Medicare covers a tetanus if you have a cut or wound; otherwise, there may be a charge. If you had not had a tetanus with pertusses, known as the Tdap, you can take this anytime.   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 Exercise 150 min/wk Moderate Activity     Do more exercise;  Keep walking weather permitted         This is a list of the screening recommended for you and due dates:  Health Maintenance  Topic Date Due  . Tetanus Vaccine  06/27/2018  . Colon Cancer Screening  06/24/2027  . Flu Shot  Completed  . Pneumonia vaccines  Completed      Fall Prevention in the Home Falls can cause injuries. They can happen to people of all ages. There are many things you can do to make your home safe and to help prevent falls. What can I do on the outside of my home?  Regularly fix the edges of walkways and driveways and fix any cracks.  Remove anything that might make you trip as you walk through a door, such as a raised step or threshold.  Trim any bushes or trees on the path to your home.  Use bright outdoor lighting.  Clear any walking paths of anything that might make someone trip, such as rocks or tools.  Regularly check to see if handrails are loose or broken. Make sure that both sides of any steps have handrails.  Any raised decks and porches should have guardrails on the edges.  Have any leaves, snow, or ice cleared regularly.  Use sand or salt on walking paths during winter.  Clean up any spills in your garage right away. This includes oil or grease  spills. What can I do in the bathroom?  Use night lights.  Install grab bars by the toilet and in the tub and shower. Do not use towel bars as grab bars.  Use non-skid mats or decals in the tub or shower.  If you need to sit down in the shower, use a plastic, non-slip stool.  Keep the floor dry. Clean up any water that spills on the floor as soon as it happens.  Remove soap buildup in the tub or shower regularly.  Attach bath mats securely with double-sided non-slip rug tape.  Do not have throw rugs and other things on the floor that can make you trip. What can I do in the bedroom?  Use night lights.  Make sure that you have a light by your bed that is easy to reach.  Do not use any sheets or blankets that are too big for your bed. They should not hang down onto the floor.  Have a firm chair that has side arms. You can use this for support while you get dressed.  Do not have throw rugs and other things on the floor that can make you trip. What can I do in the kitchen?  Clean up any spills right away.  Avoid walking on  wet floors.  Keep items that you use a lot in easy-to-reach places.  If you need to reach something above you, use a strong step stool that has a grab bar.  Keep electrical cords out of the way.  Do not use floor polish or wax that makes floors slippery. If you must use wax, use non-skid floor wax.  Do not have throw rugs and other things on the floor that can make you trip. What can I do with my stairs?  Do not leave any items on the stairs.  Make sure that there are handrails on both sides of the stairs and use them. Fix handrails that are broken or loose. Make sure that handrails are as long as the stairways.  Check any carpeting to make sure that it is firmly attached to the stairs. Fix any carpet that is loose or worn.  Avoid having throw rugs at the top or bottom of the stairs. If you do have throw rugs, attach them to the floor with carpet  tape.  Make sure that you have a light switch at the top of the stairs and the bottom of the stairs. If you do not have them, ask someone to add them for you. What else can I do to help prevent falls?  Wear shoes that: ? Do not have high heels. ? Have rubber bottoms. ? Are comfortable and fit you well. ? Are closed at the toe. Do not wear sandals.  If you use a stepladder: ? Make sure that it is fully opened. Do not climb a closed stepladder. ? Make sure that both sides of the stepladder are locked into place. ? Ask someone to hold it for you, if possible.  Clearly mark and make sure that you can see: ? Any grab bars or handrails. ? First and last steps. ? Where the edge of each step is.  Use tools that help you move around (mobility aids) if they are needed. These include: ? Canes. ? Walkers. ? Scooters. ? Crutches.  Turn on the lights when you go into a dark area. Replace any light bulbs as soon as they burn out.  Set up your furniture so you have a clear path. Avoid moving your furniture around.  If any of your floors are uneven, fix them.  If there are any pets around you, be aware of where they are.  Review your medicines with your doctor. Some medicines can make you feel dizzy. This can increase your chance of falling. Ask your doctor what other things that you can do to help prevent falls. This information is not intended to replace advice given to you by your health care provider. Make sure you discuss any questions you have with your health care provider. Document Released: 07/24/2009 Document Revised: 03/04/2016 Document Reviewed: 11/01/2014 Elsevier Interactive Patient Education  2018 [place] Maintenance, Male A healthy lifestyle and preventive care is important for your health and wellness. Ask your health care provider about what schedule of regular examinations is right for you. What should I know about weight and diet? Eat a Healthy  Diet  Eat plenty of vegetables, fruits, whole grains, low-fat dairy products, and lean protein.  Do not eat a lot of foods high in solid fats, added sugars, or salt.  Maintain a Healthy Weight Regular exercise can help you achieve or maintain a healthy weight. You should:  Do at least 150 minutes of exercise each week. The exercise should increase your  heart rate and make you sweat (moderate-intensity exercise).  Do strength-training exercises at least twice a week.  Watch Your Levels of Cholesterol and Blood Lipids  Have your blood tested for lipids and cholesterol every 5 years starting at 75 years of age. If you are at high risk for heart disease, you should start having your blood tested when you are 75 years old. You may need to have your cholesterol levels checked more often if: ? Your lipid or cholesterol levels are high. ? You are older than 75 years of age. ? You are at high risk for heart disease.  What should I know about cancer screening? Many types of cancers can be detected early and may often be prevented. Lung Cancer  You should be screened every year for lung cancer if: ? You are a current smoker who has smoked for at least 30 years. ? You are a former smoker who has quit within the past 15 years.  Talk to your health care provider about your screening options, when you should start screening, and how often you should be screened.  Colorectal Cancer  Routine colorectal cancer screening usually begins at 75 years of age and should be repeated every 5-10 years until you are 75 years old. You may need to be screened more often if early forms of precancerous polyps or small growths are found. Your health care provider may recommend screening at an earlier age if you have risk factors for colon cancer.  Your health care provider may recommend using home test kits to check for hidden blood in the stool.  A small camera at the end of a tube can be used to examine your  colon (sigmoidoscopy or colonoscopy). This checks for the earliest forms of colorectal cancer.  Prostate and Testicular Cancer  Depending on your age and overall health, your health care provider may do certain tests to screen for prostate and testicular cancer.  Talk to your health care provider about any symptoms or concerns you have about testicular or prostate cancer.  Skin Cancer  Check your skin from head to toe regularly.  Tell your health care provider about any new moles or changes in moles, especially if: ? There is a change in a mole's size, shape, or color. ? You have a mole that is larger than a pencil eraser.  Always use sunscreen. Apply sunscreen liberally and repeat throughout the day.  Protect yourself by wearing long sleeves, pants, a wide-brimmed hat, and sunglasses when outside.  What should I know about heart disease, diabetes, and high blood pressure?  If you are 45-54 years of age, have your blood pressure checked every 3-5 years. If you are 76 years of age or older, have your blood pressure checked every year. You should have your blood pressure measured twice-once when you are at a hospital or clinic, and once when you are not at a hospital or clinic. Record the average of the two measurements. To check your blood pressure when you are not at a hospital or clinic, you can use: ? An automated blood pressure machine at a pharmacy. ? A home blood pressure monitor.  Talk to your health care provider about your target blood pressure.  If you are between 50-82 years old, ask your health care provider if you should take aspirin to prevent heart disease.  Have regular diabetes screenings by checking your fasting blood sugar level. ? If you are at a normal weight and have a low risk  for diabetes, have this test once every three years after the age of 20. ? If you are overweight and have a high risk for diabetes, consider being tested at a younger age or more  often.  A one-time screening for abdominal aortic aneurysm (AAA) by ultrasound is recommended for men aged 57-75 years who are current or former smokers. What should I know about preventing infection? Hepatitis B If you have a higher risk for hepatitis B, you should be screened for this virus. Talk with your health care provider to find out if you are at risk for hepatitis B infection. Hepatitis C Blood testing is recommended for:  Everyone born from [birth year] through 1963.  Anyone with known risk factors for hepatitis C.  Sexually Transmitted Diseases (STDs)  You should be screened each year for STDs including gonorrhea and chlamydia if: ? You are sexually active and are younger than 75 years of age. ? You are older than 75 years of age and your health care provider tells you that you are at risk for this type of infection. ? Your sexual activity has changed since you were last screened and you are at an increased risk for chlamydia or gonorrhea. Ask your health care provider if you are at risk.  Talk with your health care provider about whether you are at high risk of being infected with HIV. Your health care provider may recommend a prescription medicine to help prevent HIV infection.  What else can I do?  Schedule regular health, dental, and eye exams.  Stay current with your vaccines (immunizations).  Do not use any tobacco products, such as cigarettes, chewing tobacco, and e-cigarettes. If you need help quitting, ask your health care provider.  Limit alcohol intake to no more than 2 drinks per day. One drink equals 12 ounces of beer, 5 ounces of wine, or 1 ounces of hard liquor.  Do not use street drugs.  Do not share needles.  Ask your health care provider for help if you need support or information about quitting drugs.  Tell your health care provider if you often feel depressed.  Tell your health care provider if you have ever been abused or do not feel safe at  home. This information is not intended to replace advice given to you by your health care provider. Make sure you discuss any questions you have with your health care provider. Document Released: 03/25/2008 Document Revised: 05/26/2016 Document Reviewed: 07/01/2015 Elsevier Interactive Patient Education  [employer].

## 2017-10-24 DIAGNOSIS — H401131 Primary open-angle glaucoma, bilateral, mild stage: Secondary | ICD-10-CM | POA: Diagnosis not present

## 2017-10-25 ENCOUNTER — Ambulatory Visit: Payer: PPO

## 2017-11-21 NOTE — Progress Notes (Addendum)
Nicholas Wilkerson Date of Birth: [birth year]   History of Present Illness: Nicholas Wilkerson is seen for followup. He has a history of dilated cardiomyopathy with initial ejection fraction of 30%. Subsequent evaluation has demonstrated improvement to 50-55%. Last Echo was in January 2018 showing decrease in EF to 35-40%. ACEi was resumed at that time. . 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 He was treated with RT and chemo.   On follow up today he is doing very well.  He denies any chest pain, dyspnea, palpitations. No recurrent CA. Notes PSA mildly elevated 4.1. Feels great and stays very busy.  Current Outpatient Medications on File Prior to Visit  Medication Sig Dispense Refill  . aspirin EC 81 MG tablet Take 81 mg by mouth daily.    . brimonidine (ALPHAGAN) 0.2 % ophthalmic solution     . carvedilol (COREG) 25 MG tablet Take 1 tablet (25 mg total) by mouth 2 (two) times daily with a meal. KEEP OV. 180 tablet 3  . diclofenac (VOLTAREN) 75 MG EC tablet Take 1 tablet (75 mg total) by mouth 2 (two) times daily. 180 tablet 3  . latanoprost (XALATAN) 0.005 % ophthalmic solution     . sodium fluoride (FLUORISHIELD) 1.1 % GEL dental gel Instill one drop of gel per tooth space of fluoride tray. Place over teeth for 5 minutes. Remove. Spit out excess. Repeat nightly. 120 mL prn  . tadalafil (CIALIS) 20 MG tablet Take 1 tablet (20 mg total) by mouth daily as needed for erectile dysfunction. 30 tablet 3  . tamsulosin (FLOMAX) 0.4 MG CAPS capsule TAKE 1 CAPSULE BY MOUTH ONCE DAILY 90 capsule 1  . traZODone (DESYREL) 50 MG tablet Take 1 tablet (50 mg total) by mouth at bedtime. 90 tablet 3   No current facility-administered medications on file prior to visit.     No Known Allergies  Past Medical History:  Diagnosis Date  . ARTHRITIS 10/22/2008  . BPH (benign prostatic hyperplasia)   . CHF (congestive heart failure) [facility])    sees Dr. Dierra Riesgo [name]   . Dilated cardiomyopathy ([place])    . DIVERTICULOSIS, COLON 12/08/2007  . Glaucoma    sees Dr. Heather [place]   . H/O asbestos exposure   . History of echocardiogram 05/22/2007   EF was 45-50% / Mild concentric LV hypertrophy with mild global hypokinesis and overall mild systolic dysfunction .  Mild AV sclerosis / Mild Mitral insufficiency / compared to prior study 04/24/02 -- LV function has improved further.    . History of kidney stones   . Hypercholesterolemia   . Hypertension   . Insomnia 06/11/2015  . Radiation 01/01/15-02/18/15   base of tongue and bilateral neck 70 Gy  . Skin cancer    squamous cell, basal cell  . Squamous cell carcinoma 11/20/14   base of tongue primary  . Throat cancer ([place]) 10/2014   had chemo    Past Surgical History:  Procedure Laterality Date  . CARDIAC CATHETERIZATION  10/17/2001   EF estimated at 30% / moderate LV enlargement  / 1. Minimal nonobstructive atherosclerotic coronary artery disease / 2. Severe LV dysfunction with global hypokinesia consistent with dilated nonischemic cardiomyopath / 3. Moderate pulmonary hypertension  . COLONOSCOPY  06/23/2017   per Dr. Carlean Purl, clear, no repeats needed   . LYMPH NODE BIOPSY      Social History   Tobacco Use  Smoking Status Never Smoker  Smokeless Tobacco Never Used  Social History   Substance and Sexual Activity  Alcohol Use Yes  . Alcohol/week: 0.0 oz   Comment: occasional beer, a couple times a month    Family History  Problem Relation Age of Onset  . Stroke Father   . Cancer Father        kidney ca  . Angina Mother   . Colon cancer Neg Hx   . Esophageal cancer Neg Hx   . Rectal cancer Neg Hx   . Stomach cancer Neg Hx     Review of Systems:  All other systems were reviewed and are negative.  Physical Exam: BP 138/80   Pulse 68   Ht 5' 8.5" (1.74 m)   Wt 167 lb 12.8 oz (76.1 kg)   BMI 25.14 kg/m  GENERAL:  Well appearing HEENT:  PERRL, EOMI, sclera are clear. Oropharynx is clear. NECK:  No jugular venous  distention, carotid upstroke brisk and symmetric, no bruits, no thyromegaly or adenopathy LUNGS:  Clear to auscultation bilaterally CHEST:  Unremarkable HEART:  RRR,  PMI not displaced or sustained,S1 and S2 within normal limits, no S3, no S4: no clicks, no rubs, no murmurs ABD:  Soft, nontender. BS +, no masses or bruits. No hepatomegaly, no splenomegaly EXT:  2 + pulses throughout, no edema, no cyanosis no clubbing SKIN:  Warm and dry.  No rashes NEURO:  Alert and oriented x 3. Cranial nerves II through XII intact. PSYCH:  Cognitively intact    LABORATORY DATA:  Lab Results  Component Value Date   WBC 5.0 09/27/2017   HGB 16.1 09/27/2017   HCT 49.5 09/27/2017   PLT 167.0 09/27/2017   GLUCOSE 98 09/27/2017   CHOL 191 09/27/2017   TRIG 112.0 09/27/2017   HDL 43.50 09/27/2017   LDLCALC 125 (H) 09/27/2017   ALT 19 09/27/2017   AST 16 09/27/2017   NA 141 09/27/2017   K 4.0 09/27/2017   CL 104 09/27/2017   CREATININE 1.13 09/27/2017   BUN 19 09/27/2017   CO2 31 09/27/2017   TSH 4.65 (H) 09/27/2017   PSA 4.16 (H) 09/27/2017   INR 0.95 07/17/2015    Ecg today shows NSR with nonspecific TWA. I have personally reviewed and interpreted this study.  Echo 11/02/16: Study Conclusions  - Left ventricle: The cavity size was mildly dilated. Wall   thickness was normal. Systolic function was moderately reduced.   The estimated ejection fraction was in the range of 35% to 40%.   Diffuse hypokinesis. Echogenic linear structure at the apex,   suggestive of calcified apical false tendon. Doppler parameters   are consistent with pseudonormal left ventricular relaxation   (grade 2 diastolic dysfunction). The E/e&' ratio is >15,   suggesting elevated LV filling pressure. - Mitral valve: Mildly thickened leaflets . There was mild   regurgitation. - Left atrium: The atrium was mildly dilated. - Inferior vena cava: The vessel was dilated. The respirophasic   diameter changes were blunted  (< 50%), consistent with elevated   central venous pressure.  Impressions:  - Compared to a prior study in 2012, the LVEF is lower at 35-40%.   The LV is mildly dilated and globally hypokinetic. LV filling   pressure appears elevated with grade 2 DD.   Assessment / Plan:  1. Dilated cardiomyopathy with EF 35-40%. We will continue long-term carvedilol and ramipril. He is asymptomatic.   2. Hypertension well controlled.  3. PVCs, asymptomatic. Continue carvedilol.   4. SSCA of the tongue. S/p RT  and chemo.

## 2017-11-25 ENCOUNTER — Encounter: Payer: Self-pay | Admitting: Cardiology

## 2017-11-25 ENCOUNTER — Ambulatory Visit: Payer: PPO | Admitting: Cardiology

## 2017-11-25 VITALS — BP 138/80 | HR 68 | Ht 68.5 in | Wt 167.8 lb

## 2017-11-25 DIAGNOSIS — I5022 Chronic systolic (congestive) heart failure: Secondary | ICD-10-CM

## 2017-11-25 MED ORDER — QUINAPRIL HCL 10 MG PO TABS: 10.0000 mg | ORAL_TABLET | Freq: Every day | ORAL | 3 refills | Status: DC

## 2017-11-25 NOTE — Patient Instructions (Addendum)
Continue your current therapy  I will see you in one year   

## 2017-12-29 ENCOUNTER — Other Ambulatory Visit (INDEPENDENT_AMBULATORY_CARE_PROVIDER_SITE_OTHER): Payer: PPO

## 2017-12-29 DIAGNOSIS — R7989 Other specified abnormal findings of blood chemistry: Secondary | ICD-10-CM | POA: Diagnosis not present

## 2017-12-29 DIAGNOSIS — R972 Elevated prostate specific antigen [PSA]: Secondary | ICD-10-CM

## 2017-12-29 LAB — T3, FREE: T3 FREE: 3 pg/mL (ref 2.3–4.2)

## 2017-12-29 LAB — PSA: PSA: 2.88 ng/mL (ref 0.10–4.00)

## 2017-12-29 LAB — TSH: TSH: 3.65 u[IU]/mL (ref 0.35–4.50)

## 2017-12-29 LAB — T4, FREE: Free T4: 0.79 ng/dL (ref 0.60–1.60)

## 2018-01-09 ENCOUNTER — Other Ambulatory Visit: Payer: Self-pay | Admitting: Family Medicine

## 2018-01-10 NOTE — Telephone Encounter (Signed)
Last OV 09/27/2017   Tamsulosin last refilled 06/27/2017 disp 90 with 1 refills  Coreg last refilled 09/23/2016 disp 180 with 3 refills  Trazodone last refilled 09/23/2016 disp 90 with 3 refills   Sent to PCP for approval

## 2018-01-13 ENCOUNTER — Telehealth: Payer: Self-pay

## 2018-01-13 NOTE — Telephone Encounter (Signed)
Copied from [place] [id number]. Topic: Quick Communication - See Telephone Encounter >> Jan 09, 2018  9:47 AM Corie Chiquito, NT wrote: CRM for notification. Patient calling because he would like to spaek with Dr.Fry or his nurse about his medication refill he requested. If they could give him a call back about this at [phone]

## 2018-01-23 DIAGNOSIS — H401131 Primary open-angle glaucoma, bilateral, mild stage: Secondary | ICD-10-CM | POA: Diagnosis not present

## 2018-03-06 ENCOUNTER — Other Ambulatory Visit: Payer: Self-pay | Admitting: Family Medicine

## 2018-03-07 ENCOUNTER — Inpatient Hospital Stay: Payer: PPO | Attending: Hematology and Oncology | Admitting: Hematology and Oncology

## 2018-03-07 ENCOUNTER — Encounter: Payer: Self-pay | Admitting: Hematology and Oncology

## 2018-03-07 ENCOUNTER — Telehealth: Payer: Self-pay | Admitting: Hematology and Oncology

## 2018-03-07 DIAGNOSIS — Z9221 Personal history of antineoplastic chemotherapy: Secondary | ICD-10-CM | POA: Diagnosis not present

## 2018-03-07 DIAGNOSIS — C01 Malignant neoplasm of base of tongue: Secondary | ICD-10-CM

## 2018-03-07 DIAGNOSIS — Z85810 Personal history of malignant neoplasm of tongue: Secondary | ICD-10-CM | POA: Diagnosis not present

## 2018-03-07 DIAGNOSIS — I10 Essential (primary) hypertension: Secondary | ICD-10-CM | POA: Diagnosis not present

## 2018-03-07 DIAGNOSIS — Z923 Personal history of irradiation: Secondary | ICD-10-CM

## 2018-03-07 NOTE — Assessment & Plan Note (Signed)
Clinically, he has no signs or symptoms of cancer recurrence He is compliant following up with ENT His primary care doctor is monitoring TSH I recommend refrain from alcohol intake if possible I plan to see him back in 1 year

## 2018-03-07 NOTE — Telephone Encounter (Signed)
Gave patient AVs and calendar of upcoming may 2020 appointments °

## 2018-03-07 NOTE — Progress Notes (Signed)
[facility] OFFICE PROGRESS NOTE  Patient Care Team: Laurey Morale, MD as PCP - General (Family Medicine) Heath Lark, MD as Consulting Physician (Hematology and Oncology) Melissa Montane, MD as Consulting Physician (Otolaryngology) Eppie Gibson, MD as Attending Physician (Radiation Oncology) Holley Bouche, NP as Nurse Practitioner (Nurse Practitioner)  ASSESSMENT & PLAN:  Cancer of base of tongue Clinically, he has no signs or symptoms of cancer recurrence He is compliant following up with ENT His primary care doctor is monitoring TSH I recommend refrain from alcohol intake if possible I plan to see him back in 1 year  Essential hypertension His blood pressure is satisfactory He will continue blood pressure management through his primary care doctor   No orders of the defined types were placed in this encounter.   INTERVAL HISTORY: Please see below for problem oriented charting. He returns for further follow-up He feels well No new lymphadenopathy Denies swallowing difficulties He is compliant with dental exam and ENT follow-up He drinks beer periodically He denies tobacco use  SUMMARY OF ONCOLOGIC HISTORY: Oncology History   Cancer of base of tongue   Staging form: Lip and Oral Cavity, AJCC 7th Edition     Clinical stage from 12/03/2014: Stage IVA (T2, N2a, M0) - Signed by Heath Lark, MD on 12/13/2014 HPV positive by PCR testing.     Cancer of base of tongue ([place])   11/20/2014 Pathology Results    Accession: [id number] confirm squamous cell carcinoma.      11/20/2014 Procedure    The patient underwent fine-needle aspirate of the right lymph node      12/02/2014 Imaging    CT scan showed 2.2 cm the right tongue base mass with necrotic 4 cm right level II nodal metastasis with multiple bilateral small lymphadenopathy      12/10/2014 Imaging    PET CT scan showed tongue base mass with right level II lymph node metastasis      12/18/2014 Pathology  Results    PCR results from [facility]: HPV 16 positive.  HPV 18 and other types negative.       12/24/2014 Procedure    He has placement of port and feeding tube      01/01/2015 - 02/12/2015 Chemotherapy    He received high dose cisplatin      01/01/2015 - 02/18/2015 Radiation Therapy    Rec'd concurrent RT to base of tongue and bilateral neck:  70 Gy in 35 fractions to gross disease, 63 Gy in 35 fractions to high risk nodal echelons, 56 Gy in 35 fractions to intermediate risk nodal echelons.      01/21/2015 Adverse Reaction     cycle 2 chemotherapy dose will be reduced by 25% due to intolerable side effects      02/11/2015 Adverse Reaction    Cycle 3 of chemotherapy dose will be reduced to 50% due to intolerable side effects      07/17/2015 Procedure    Both feeding tube and port were removed       REVIEW OF SYSTEMS:   Constitutional: Denies fevers, chills or abnormal weight loss Eyes: Denies blurriness of vision Ears, nose, mouth, throat, and face: Denies mucositis or sore throat Respiratory: Denies cough, dyspnea or wheezes Cardiovascular: Denies palpitation, chest discomfort or lower extremity swelling Gastrointestinal:  Denies nausea, heartburn or change in bowel habits Skin: Denies abnormal skin rashes Lymphatics: Denies new lymphadenopathy or easy bruising Neurological:Denies numbness, tingling or new weaknesses Behavioral/Psych: Mood is stable, no new  changes  All other systems were reviewed with the patient and are negative.  I have reviewed the past medical history, past surgical history, social history and family history with the patient and they are unchanged from previous note.  ALLERGIES:  has No Known Allergies.  MEDICATIONS:  Current Outpatient Medications  Medication Sig Dispense Refill  . aspirin EC 81 MG tablet Take 81 mg by mouth daily.    . brimonidine (ALPHAGAN) 0.2 % ophthalmic solution     . carvedilol (COREG) 25 MG tablet TAKE ONE TABLET BY MOUTH  TWICE DAILY WITH MEALS -  KEEP  OFFICE  VISIT- 180 tablet 3  . diclofenac (VOLTAREN) 75 MG EC tablet Take 1 tablet (75 mg total) by mouth 2 (two) times daily. 180 tablet 3  . latanoprost (XALATAN) 0.005 % ophthalmic solution     . quinapril (ACCUPRIL) 10 MG tablet Take 1 tablet (10 mg total) by mouth daily. 90 tablet 3  . sodium fluoride (FLUORISHIELD) 1.1 % GEL dental gel Instill one drop of gel per tooth space of fluoride tray. Place over teeth for 5 minutes. Remove. Spit out excess. Repeat nightly. 120 mL prn  . tadalafil (CIALIS) 20 MG tablet Take 1 tablet (20 mg total) by mouth daily as needed for erectile dysfunction. 30 tablet 3  . tamsulosin (FLOMAX) 0.4 MG CAPS capsule TAKE 1 CAPSULE BY MOUTH ONCE DAILY 90 capsule 3  . traZODone (DESYREL) 50 MG tablet TAKE ONE TABLET BY MOUTH ONCE DAILY AT BEDTIME 90 tablet 3   No current facility-administered medications for this visit.     PHYSICAL EXAMINATION: ECOG PERFORMANCE STATUS: 0 - Asymptomatic  Vitals:   03/07/18 0908  BP: 118/77  Pulse: 69  Resp: 18  Temp: 97.7 F (36.5 C)  SpO2: 100%   Filed Weights   03/07/18 0908  Weight: 164 lb 14.4 oz (74.8 kg)    GENERAL:alert, no distress and comfortable SKIN: skin color, texture, turgor are normal, no rashes or significant lesions EYES: normal, Conjunctiva are pink and non-injected, sclera clear OROPHARYNX:no exudate, no erythema and lips, buccal mucosa, and tongue normal  NECK: supple, thyroid normal size, non-tender, without nodularity LYMPH:  no palpable lymphadenopathy in the cervical, axillary or inguinal LUNGS: clear to auscultation and percussion with normal breathing effort HEART: regular rate & rhythm and no murmurs and no lower extremity edema ABDOMEN:abdomen soft, non-tender and normal bowel sounds Musculoskeletal:no cyanosis of digits and no clubbing  NEURO: alert & oriented x 3 with fluent speech, no focal motor/sensory deficits  LABORATORY DATA:  I have reviewed the  data as listed    Component Value Date/Time   NA 141 09/27/2017 1022   NA 141 12/12/2015 1028   K 4.0 09/27/2017 1022   K 4.7 12/12/2015 1028   CL 104 09/27/2017 1022   CO2 31 09/27/2017 1022   CO2 28 12/12/2015 1028   GLUCOSE 98 09/27/2017 1022   GLUCOSE 103 12/12/2015 1028   BUN 19 09/27/2017 1022   BUN 15.2 06/21/2016 0849   CREATININE 1.13 09/27/2017 1022   CREATININE 1.35 (H) 11/19/2016 1154   CREATININE 1.3 06/21/2016 0849   CALCIUM 8.9 09/27/2017 1022   CALCIUM 9.1 12/12/2015 1028   PROT 6.1 09/27/2017 1022   PROT 6.8 12/12/2015 1028   ALBUMIN 3.9 09/27/2017 1022   ALBUMIN 3.8 12/12/2015 1028   AST 16 09/27/2017 1022   AST 17 12/12/2015 1028   ALT 19 09/27/2017 1022   ALT 19 12/12/2015 1028   ALKPHOS 53 09/27/2017  1022   ALKPHOS 96 12/12/2015 1028   BILITOT 0.6 09/27/2017 1022   BILITOT 0.46 12/12/2015 1028   GFRNONAA 45 (L) 04/14/2015 1042   GFRAA 53 (L) 04/14/2015 1042    No results found for: SPEP, UPEP  Lab Results  Component Value Date   WBC 5.0 09/27/2017   NEUTROABS 3.6 09/27/2017   HGB 16.1 09/27/2017   HCT 49.5 09/27/2017   MCV 95.3 09/27/2017   PLT 167.0 09/27/2017      Chemistry      Component Value Date/Time   NA 141 09/27/2017 1022   NA 141 12/12/2015 1028   K 4.0 09/27/2017 1022   K 4.7 12/12/2015 1028   CL 104 09/27/2017 1022   CO2 31 09/27/2017 1022   CO2 28 12/12/2015 1028   BUN 19 09/27/2017 1022   BUN 15.2 06/21/2016 0849   CREATININE 1.13 09/27/2017 1022   CREATININE 1.35 (H) 11/19/2016 1154   CREATININE 1.3 06/21/2016 0849      Component Value Date/Time   CALCIUM 8.9 09/27/2017 1022   CALCIUM 9.1 12/12/2015 1028   ALKPHOS 53 09/27/2017 1022   ALKPHOS 96 12/12/2015 1028   AST 16 09/27/2017 1022   AST 17 12/12/2015 1028   ALT 19 09/27/2017 1022   ALT 19 12/12/2015 1028   BILITOT 0.6 09/27/2017 1022   BILITOT 0.46 12/12/2015 1028      All questions were answered. The patient knows to call the clinic with any  problems, questions or concerns. No barriers to learning was detected.  I spent 10 minutes counseling the patient face to face. The total time spent in the appointment was 15 minutes and more than 50% was on counseling and review of test results  Heath Lark, MD 03/07/2018 9:14 AM

## 2018-03-07 NOTE — Assessment & Plan Note (Signed)
His blood pressure is satisfactory He will continue blood pressure management through his primary care doctor

## 2018-03-29 DIAGNOSIS — Z85810 Personal history of malignant neoplasm of tongue: Secondary | ICD-10-CM | POA: Diagnosis not present

## 2018-03-29 DIAGNOSIS — Z923 Personal history of irradiation: Secondary | ICD-10-CM | POA: Diagnosis not present

## 2018-04-24 DIAGNOSIS — H401131 Primary open-angle glaucoma, bilateral, mild stage: Secondary | ICD-10-CM | POA: Diagnosis not present

## 2018-05-29 DIAGNOSIS — H401131 Primary open-angle glaucoma, bilateral, mild stage: Secondary | ICD-10-CM | POA: Diagnosis not present

## 2018-06-13 ENCOUNTER — Ambulatory Visit (INDEPENDENT_AMBULATORY_CARE_PROVIDER_SITE_OTHER): Payer: PPO | Admitting: Family Medicine

## 2018-06-13 ENCOUNTER — Encounter: Payer: Self-pay | Admitting: Family Medicine

## 2018-06-13 VITALS — BP 128/78 | HR 72 | Temp 98.0°F | Wt 166.0 lb

## 2018-06-13 DIAGNOSIS — M5431 Sciatica, right side: Secondary | ICD-10-CM | POA: Diagnosis not present

## 2018-06-13 NOTE — Progress Notes (Signed)
   Subjective:    Patient ID: Nicholas Wilkerson, male    DOB: 08-May-1941, 77 y.o.   MRN: [id number]  HPI Here for right sided low back pain whih radiates down the right leg. He also has numbness and tingling in the leg and foot, but no weakness. He uses Diclofenac and CBD cream for the pain. In 2005 he saw Dr. Joya Salm for a similar pain and he was given an epidural steroid shot that helped a great deal.    Review of Systems  Constitutional: Negative.   Respiratory: Negative.   Cardiovascular: Negative.   Musculoskeletal: Positive for back pain.  Neurological: Positive for numbness. Negative for weakness.       Objective:   Physical Exam  Constitutional: He appears well-developed and well-nourished.  Cardiovascular: Normal rate, regular rhythm, normal heart sounds and intact distal pulses.  Pulmonary/Chest: Effort normal and breath sounds normal.  Musculoskeletal:  Tender in the right lower back and over the right sciatic notch. Positive SLR on the right. Full ROM          Assessment & Plan:  Right sciatica. We will set up an MRI soon to evaluate. Alysia Penna, MD

## 2018-07-31 DIAGNOSIS — L821 Other seborrheic keratosis: Secondary | ICD-10-CM | POA: Diagnosis not present

## 2018-07-31 DIAGNOSIS — L570 Actinic keratosis: Secondary | ICD-10-CM | POA: Diagnosis not present

## 2018-07-31 DIAGNOSIS — D1801 Hemangioma of skin and subcutaneous tissue: Secondary | ICD-10-CM | POA: Diagnosis not present

## 2018-07-31 DIAGNOSIS — D225 Melanocytic nevi of trunk: Secondary | ICD-10-CM | POA: Diagnosis not present

## 2018-08-07 ENCOUNTER — Other Ambulatory Visit: Payer: Self-pay | Admitting: Family Medicine

## 2018-08-09 ENCOUNTER — Ambulatory Visit (INDEPENDENT_AMBULATORY_CARE_PROVIDER_SITE_OTHER): Payer: PPO

## 2018-08-09 DIAGNOSIS — Z23 Encounter for immunization: Secondary | ICD-10-CM | POA: Diagnosis not present

## 2018-08-22 ENCOUNTER — Other Ambulatory Visit: Payer: Self-pay | Admitting: *Deleted

## 2018-08-22 MED ORDER — DICLOFENAC SODIUM 75 MG PO TBEC: 75.0000 mg | DELAYED_RELEASE_TABLET | Freq: Two times a day (BID) | ORAL | 1 refills | Status: DC

## 2018-08-30 DIAGNOSIS — Z85810 Personal history of malignant neoplasm of tongue: Secondary | ICD-10-CM | POA: Diagnosis not present

## 2018-09-25 DIAGNOSIS — H401130 Primary open-angle glaucoma, bilateral, stage unspecified: Secondary | ICD-10-CM | POA: Diagnosis not present

## 2018-09-28 ENCOUNTER — Encounter: Payer: PPO | Admitting: Family Medicine

## 2018-09-29 ENCOUNTER — Ambulatory Visit (INDEPENDENT_AMBULATORY_CARE_PROVIDER_SITE_OTHER): Payer: PPO | Admitting: Family Medicine

## 2018-09-29 ENCOUNTER — Other Ambulatory Visit: Payer: Self-pay | Admitting: Cardiology

## 2018-09-29 ENCOUNTER — Encounter: Payer: Self-pay | Admitting: Family Medicine

## 2018-09-29 VITALS — BP 122/78 | HR 73 | Temp 97.7°F | Ht 68.5 in | Wt 165.2 lb

## 2018-09-29 DIAGNOSIS — Z0000 Encounter for general adult medical examination without abnormal findings: Secondary | ICD-10-CM | POA: Diagnosis not present

## 2018-09-29 LAB — CBC WITH DIFFERENTIAL/PLATELET
BASOS ABS: 0.1 10*3/uL (ref 0.0–0.1)
Basophils Relative: 0.9 % (ref 0.0–3.0)
EOS ABS: 0.1 10*3/uL (ref 0.0–0.7)
Eosinophils Relative: 1.5 % (ref 0.0–5.0)
HCT: 52.2 % — ABNORMAL HIGH (ref 39.0–52.0)
Hemoglobin: 17.3 g/dL — ABNORMAL HIGH (ref 13.0–17.0)
Lymphocytes Relative: 16.5 % (ref 12.0–46.0)
Lymphs Abs: 1 10*3/uL (ref 0.7–4.0)
MCHC: 33.2 g/dL (ref 30.0–36.0)
MCV: 92.2 fl (ref 78.0–100.0)
MONOS PCT: 10.3 % (ref 3.0–12.0)
Monocytes Absolute: 0.6 10*3/uL (ref 0.1–1.0)
NEUTROS ABS: 4.3 10*3/uL (ref 1.4–7.7)
NEUTROS PCT: 70.8 % (ref 43.0–77.0)
PLATELETS: 209 10*3/uL (ref 150.0–400.0)
RBC: 5.66 Mil/uL (ref 4.22–5.81)
RDW: 14 % (ref 11.5–15.5)
WBC: 6 10*3/uL (ref 4.0–10.5)

## 2018-09-29 LAB — LIPID PANEL
Cholesterol: 206 mg/dL — ABNORMAL HIGH (ref 0–200)
HDL: 43.9 mg/dL (ref >39.00)
LDL Cholesterol: 143 mg/dL — ABNORMAL HIGH (ref 0–99)
NonHDL: 161.86
Total CHOL/HDL Ratio: 5
Triglycerides: 93 mg/dL (ref 0.0–149.0)
VLDL: 18.6 mg/dL (ref 0.0–40.0)

## 2018-09-29 LAB — POC URINALSYSI DIPSTICK (AUTOMATED)
BILIRUBIN UA: NEGATIVE
Blood, UA: NEGATIVE
Glucose, UA: NEGATIVE
KETONES UA: NEGATIVE
Leukocytes, UA: NEGATIVE
Nitrite, UA: NEGATIVE
PH UA: 6 (ref 5.0–8.0)
Protein, UA: POSITIVE — AB
Spec Grav, UA: 1.02 (ref 1.010–1.025)
Urobilinogen, UA: 0.2 E.U./dL

## 2018-09-29 LAB — HEPATIC FUNCTION PANEL
ALBUMIN: 4.4 g/dL (ref 3.5–5.2)
ALT: 19 U/L (ref 0–53)
AST: 17 U/L (ref 0–37)
Alkaline Phosphatase: 63 U/L (ref 39–117)
Bilirubin, Direct: 0.1 mg/dL (ref 0.0–0.3)
Total Bilirubin: 0.6 mg/dL (ref 0.2–1.2)
Total Protein: 6.5 g/dL (ref 6.0–8.3)

## 2018-09-29 LAB — BASIC METABOLIC PANEL
BUN: 17 mg/dL (ref 6–23)
CHLORIDE: 102 meq/L (ref 96–112)
CO2: 32 meq/L (ref 19–32)
CREATININE: 1.08 mg/dL (ref 0.40–1.50)
Calcium: 9.4 mg/dL (ref 8.4–10.5)
GFR: 70.83 mL/min (ref >60.00)
Glucose, Bld: 99 mg/dL (ref 70–99)
Potassium: 4.2 mEq/L (ref 3.5–5.1)
Sodium: 141 mEq/L (ref 135–145)

## 2018-09-29 LAB — PSA: PSA: 4.29 ng/mL — ABNORMAL HIGH (ref 0.10–4.00)

## 2018-09-29 LAB — TSH: TSH: 3.94 u[IU]/mL (ref 0.35–4.50)

## 2018-09-29 NOTE — Progress Notes (Signed)
   Subjective:    Patient ID: Nicholas Wilkerson, male    DOB: 08-07-1941, 77 y.o.   MRN: [id number]  HPI Here for a well exam. He feels great.    Review of Systems  Constitutional: Negative.   HENT: Negative.   Eyes: Negative.   Respiratory: Negative.   Cardiovascular: Negative.   Gastrointestinal: Negative.   Genitourinary: Negative.   Musculoskeletal: Negative.   Skin: Negative.   Neurological: Negative.   Psychiatric/Behavioral: Negative.        Objective:   Physical Exam Constitutional:      General: He is not in acute distress.    Appearance: He is well-developed. He is not diaphoretic.  HENT:     Head: Normocephalic and atraumatic.     Right Ear: External ear normal.     Left Ear: External ear normal.     Nose: Nose normal.     Mouth/Throat:     Pharynx: No oropharyngeal exudate.  Eyes:     General: No scleral icterus.       Right eye: No discharge.        Left eye: No discharge.     Conjunctiva/sclera: Conjunctivae normal.     Pupils: Pupils are equal, round, and reactive to light.  Neck:     Musculoskeletal: Neck supple.     Thyroid: No thyromegaly.     Vascular: No JVD.     Trachea: No tracheal deviation.  Cardiovascular:     Rate and Rhythm: Normal rate and regular rhythm.     Heart sounds: Normal heart sounds. No murmur. No friction rub. No gallop.   Pulmonary:     Effort: Pulmonary effort is normal. No respiratory distress.     Breath sounds: Normal breath sounds. No wheezing or rales.  Chest:     Chest wall: No tenderness.  Abdominal:     General: Bowel sounds are normal. There is no distension.     Palpations: Abdomen is soft. There is no mass.     Tenderness: There is no abdominal tenderness. There is no guarding or rebound.  Genitourinary:    Penis: Normal. No tenderness.      Prostate: Normal.     Rectum: Normal. Guaiac result negative.  Musculoskeletal: Normal range of motion.        General: No tenderness.  Lymphadenopathy:   Cervical: No cervical adenopathy.  Skin:    General: Skin is warm and dry.     Coloration: Skin is not pale.     Findings: No erythema or rash.  Neurological:     Mental Status: He is alert and oriented to person, place, and time.     Cranial Nerves: No cranial nerve deficit.     Motor: No abnormal muscle tone.     Coordination: Coordination normal.     Deep Tendon Reflexes: Reflexes are normal and symmetric. Reflexes normal.  Psychiatric:        Behavior: Behavior normal.        Thought Content: Thought content normal.        Judgment: Judgment normal.           Assessment & Plan:  Well exam. We discussed diet and exercise. Get fasting labs.  Alysia Penna, MD

## 2018-10-02 ENCOUNTER — Encounter: Payer: Self-pay | Admitting: *Deleted

## 2018-10-09 ENCOUNTER — Telehealth: Payer: Self-pay

## 2018-10-09 NOTE — Telephone Encounter (Signed)
Called and spoke with pt and he is aware of lab results and nothing further is needed.

## 2018-10-09 NOTE — Telephone Encounter (Signed)
Copied from [place] [id number]. Topic: General - Other >> Oct 09, 2018 10:26 AM Judyann Munson wrote: Reason for CRM: Patient is calling to request a call back in regards to his lab results. Pt stated he is needing  a better understanding of his results. Please advise

## 2018-10-20 NOTE — Progress Notes (Addendum)
Subjective:   Nicholas Wilkerson is a 78 y.o. male who presents for Medicare Annual/Subsequent preventive examination.  Review of Systems: No ROS.  Medicare Wellness Visit. Additional risk factors are reflected in the social history.   Cardiac Risk Factors include: hypertension;advanced age (>72men, >102 women);male gender;dyslipidemia Sleep patterns: feels rested on waking and gets up 2 times nightly to void.    Home Safety/Smoke Alarms: Feels safe in home. Smoke alarms in place.  Living environment; residence and Firearm Safety: 2-story house. States he can get up and down the stairs without difficulty, no need for DME.  Seat Belt Safety/Bike Helmet: Wears seat belt.   Male:   CCS- done 06/2017; due 06/2027   PSA- per Dr. Sarajane Jews, will recheck in 90 days from 12/20.  Lab Results  Component Value Date   PSA 4.29 (H) 09/29/2018   PSA 2.88 12/29/2017   PSA 4.16 (H) 09/27/2017       Objective:    Vitals: BP 128/76 (BP Location: Right Arm, Patient Position: Sitting, Cuff Size: Normal)   Pulse 72   Resp 16   Wt 166 lb (75.3 kg)   SpO2 96%   BMI 24.87 kg/m   Body mass index is 24.87 kg/m.  Advanced Directives 10/21/2017 02/25/2017 12/19/2015 08/18/2015 07/17/2015 07/14/2015 06/27/2015  Does Patient Have a Medical Advance Directive? Yes Yes No Yes - Yes Yes  Type of Advance Directive - [place];Living will [place];Living will [place];Living will - [place];Living will [place];Living will  Does patient want to make changes to medical advance directive? - - - No - Patient declined - No - Patient declined -  Copy of [place] in Chart? - Yes Yes No - copy requested No - copy requested No - copy requested -  Would patient like information on creating a medical advance directive? - - - - - - -    Tobacco Social History   Tobacco Use  Smoking Status Never Smoker  Smokeless  Tobacco Never Used     Counseling given: Not Answered   Past Medical History:  Diagnosis Date  . ARTHRITIS 10/22/2008  . BPH (benign prostatic hyperplasia)   . CHF (congestive heart failure) [facility])    sees Dr. Peter [name]   . Dilated cardiomyopathy ([place])   . DIVERTICULOSIS, COLON 12/08/2007  . Glaucoma    sees Dr. Heather [place]   . H/O asbestos exposure   . History of echocardiogram 05/22/2007   EF was 45-50% / Mild concentric LV hypertrophy with mild global hypokinesis and overall mild systolic dysfunction .  Mild AV sclerosis / Mild Mitral insufficiency / compared to prior study 04/24/02 -- LV function has improved further.    . History of kidney stones   . Hypercholesterolemia   . Hypertension   . Insomnia 06/11/2015  . Radiation 01/01/15-02/18/15   base of tongue and bilateral neck 70 Gy  . Skin cancer    squamous cell, basal cell  . Squamous cell carcinoma 11/20/14   base of tongue primary  . Throat cancer ([place]) 10/2014   had chemo   Past Surgical History:  Procedure Laterality Date  . CARDIAC CATHETERIZATION  10/17/2001   EF estimated at 30% / moderate LV enlargement  / 1. Minimal nonobstructive atherosclerotic coronary artery disease / 2. Severe LV dysfunction with global hypokinesia consistent with dilated nonischemic cardiomyopath / 3. Moderate pulmonary hypertension  . COLONOSCOPY  06/23/2017  per Dr. Carlean Purl, clear, no repeats needed   . LYMPH NODE BIOPSY     Family History  Problem Relation Age of Onset  . Stroke Father   . Cancer Father        kidney ca  . Angina Mother   . Colon cancer Neg Hx   . Esophageal cancer Neg Hx   . Rectal cancer Neg Hx   . Stomach cancer Neg Hx    Social History   Socioeconomic History  . Marital status: Married    Spouse name: Not on file  . Number of children: 4  . Years of education: Not on file  . Highest education level: Not on file  Occupational History  . Occupation: Industrial/product designer: RETIRED    Comment:  retired  Scientific laboratory technician  . Financial resource strain: Not very hard  . Food insecurity:    Worry: Never true    Inability: Never true  . Transportation needs:    Medical: No    Non-medical: No  Tobacco Use  . Smoking status: Never Smoker  . Smokeless tobacco: Never Used  Substance and Sexual Activity  . Alcohol use: Yes    Alcohol/week: 0.0 standard drinks    Comment: occasional beer, a couple times a month  . Drug use: No  . Sexual activity: Not on file    Comment: retired Pension scheme manager, married  Lifestyle  . Physical activity:    Days per week: 3 days    Minutes per session: 30 min  . Stress: Not at all  Relationships  . Social connections:    Talks on phone: Three times a week    Gets together: More than three times a week    Attends religious service: More than 4 times per year    Active member of club or organization: Yes    Attends meetings of clubs or organizations: More than 4 times per year    Relationship status: Married  Other Topics Concern  . Not on file  Social History Narrative   Married, retired Pension scheme manager 4 daughters, one of whom is a Marine scientist. Several grandchildren who he loves to play with.   1 coffee daily   02/21/2017    Outpatient Encounter Medications as of 10/24/2018  Medication Sig  . aspirin EC 81 MG tablet Take 81 mg by mouth daily.  . brimonidine (ALPHAGAN) 0.2 % ophthalmic solution   . carvedilol (COREG) 25 MG tablet TAKE ONE TABLET BY MOUTH TWICE DAILY WITH MEALS -  KEEP  OFFICE  VISIT-  . latanoprost (XALATAN) 0.005 % ophthalmic solution   . Melatonin 10 MG TABS Take by mouth.  . quinapril (ACCUPRIL) 10 MG tablet Take 1 tablet (10 mg total) by mouth daily.  . sodium fluoride (FLUORISHIELD) 1.1 % GEL dental gel Instill one drop of gel per tooth space of fluoride tray. Place over teeth for 5 minutes. Remove. Spit out excess. Repeat nightly.  . tamsulosin (FLOMAX) 0.4 MG CAPS capsule TAKE 1 CAPSULE BY MOUTH ONCE DAILY  . [DISCONTINUED]  quinapril (ACCUPRIL) 10 MG tablet TAKE 1 TABLET BY MOUTH ONCE DAILY  . diclofenac (VOLTAREN) 75 MG EC tablet Take 1 tablet (75 mg total) by mouth 2 (two) times daily. (Patient not taking: Reported on 10/24/2018)  . tadalafil (CIALIS) 20 MG tablet Take 1 tablet (20 mg total) by mouth daily as needed for erectile dysfunction. (Patient not taking: Reported on 10/24/2018)   No facility-administered encounter medications on file as  of 10/24/2018.     Activities of Daily Living In your present state of health, do you have any difficulty performing the following activities: 10/24/2018 09/29/2018  Hearing? N N  Vision? N N  Difficulty concentrating or making decisions? N N  Walking or climbing stairs? N N  Dressing or bathing? N N  Doing errands, shopping? N N  Preparing Food and eating ? N -  Using the Toilet? N -  In the past six months, have you accidently leaked urine? N -  Do you have problems with loss of bowel control? N -  Managing your Medications? N -  Managing your Finances? N -  Housekeeping or managing your Housekeeping? N -  Some recent data might be hidden    Patient Care Team: Laurey Morale, MD as PCP - General (Family Medicine) Heath Lark, MD as Consulting Physician (Hematology and Oncology) Melissa Montane, MD as Consulting Physician (Otolaryngology) Eppie Gibson, MD as Attending Physician (Radiation Oncology) Holley Bouche, NP (Inactive) as Nurse Practitioner (Nurse Practitioner) [name], [name], MD as Consulting Physician (Cardiology) [place], Heather, OD (Optometry)   Assessment:   This is a routine wellness examination for Nicholas Wilkerson. Physical assessment deferred to PCP.   Exercise Activities and Dietary recommendations Current Exercise Habits: The patient does not participate in regular exercise at present. Walks, golfs, but not on regular basis. Remains active around the house however.  Diet (meal preparation, eat out, water intake, caffeinated beverages, dairy  products, fruits and vegetables): in general, a "healthy" diet  "but could be better". Low cholesterol diet choices and resource on foods that naturally lower PSA levels provided to patient.   Goals    . Exercise 150 min/wk Moderate Activity     Do more exercise;  Keep walking weather permitted      . Patient Stated     Continue staying active walking, playing with grandkids.       Fall Risk Fall Risk  10/24/2018 09/29/2018 10/21/2017 02/25/2017 12/19/2015  Falls in the past year? 1 0 No No No  Comment fell off ladder while cleaning gutters; seen in ED, only broke a finger - - - -  Number falls in past yr: 0 - - - -  Injury with Fall? 1 - - - -  Risk for fall due to : History of fall(s) - - - -  Follow up Education provided - - - -    Depression Screen PHQ 2/9 Scores 10/24/2018 09/29/2018 10/21/2017 04/04/2017  PHQ - 2 Score 0 0 0 0  PHQ- 9 Score 0 - - -    Cognitive Function MMSE - Mini Mental State Exam 10/21/2017  Not completed: (No Data)        Immunization History  Administered Date(s) Administered  . Influenza Split 08/11/2011, 08/31/2012  . Influenza Whole 08/18/2007, 07/11/2008, 08/12/2009, 06/25/2010  . Influenza, High Dose Seasonal PF 07/31/2013, 07/01/2016, 07/18/2017, 08/09/2018  . Influenza,inj,Quad PF,6+ Mos 07/31/2014, 06/13/2015  . Pneumococcal Conjugate-13 09/23/2016  . Pneumococcal Polysaccharide-23 06/19/2009  . Td 06/27/2008  . Zoster 07/11/2008    Screening Tests Health Maintenance  Topic Date Due  . TETANUS/TDAP  06/27/2018  . COLONOSCOPY  06/24/2027  . INFLUENZA VACCINE  Completed  . PNA vac Low Risk Adult  Completed      Plan:    Continue staying active walking, playing with grandkids  Reference information provided regarding low cholesterol food options and diet choices that will help lower PSA levels.   Thirty day refill of  quinipril sent to preferred pharmacy per Dr. Barbie Banner verbal approval until patient sees cardiologist, Dr. [name].     Will follow up with Dr. Sarajane Jews regarding need for PSA re-check in March.  Pt. elected to wait to make AWV appointment for 2021.   I have personally reviewed and noted the following in the patient's chart:   . Medical and social history . Use of alcohol, tobacco or illicit drugs  . Current medications and supplements . Functional ability and status . Nutritional status . Physical activity . Advanced directives . List of other physicians . Vitals . Screenings to include cognitive, depression, and falls . Referrals and appointments  In addition, I have reviewed and discussed with patient certain preventive protocols, quality metrics, and best practice recommendations. A written personalized care plan for preventive services as well as general preventive health recommendations were provided to patient.     Alphia Moh, RN  10/24/2018  I have read this note and agree with its contents.  Alysia Penna, MD

## 2018-10-24 ENCOUNTER — Telehealth: Payer: Self-pay

## 2018-10-24 ENCOUNTER — Ambulatory Visit (INDEPENDENT_AMBULATORY_CARE_PROVIDER_SITE_OTHER): Payer: PPO

## 2018-10-24 VITALS — BP 128/76 | HR 72 | Resp 16 | Ht 68.5 in | Wt 166.0 lb

## 2018-10-24 DIAGNOSIS — Z0000 Encounter for general adult medical examination without abnormal findings: Secondary | ICD-10-CM | POA: Diagnosis not present

## 2018-10-24 DIAGNOSIS — I10 Essential (primary) hypertension: Secondary | ICD-10-CM | POA: Diagnosis not present

## 2018-10-24 MED ORDER — QUINAPRIL HCL 10 MG PO TABS: 10.0000 mg | ORAL_TABLET | Freq: Every day | ORAL | 0 refills | Status: DC

## 2018-10-24 NOTE — Patient Instructions (Addendum)
    Nicholas Wilkerson , Thank you for taking time to come for your Medicare Wellness Visit. I appreciate your ongoing commitment to your health goals. Please review the following plan we discussed and let me know if I can assist you in the future.   These are the goals we discussed: Goals    . Exercise 150 min/wk Moderate Activity     Do more exercise;  Keep walking weather permitted      . Patient Stated     Continue staying active walking, playing with grandkids.       This is a list of the screening recommended for you and due dates:  Health Maintenance  Topic Date Due  . Tetanus Vaccine  06/27/2018  . Colon Cancer Screening  06/24/2027  . Flu Shot  Completed  . Pneumonia vaccines  Completed

## 2018-10-24 NOTE — Telephone Encounter (Signed)
At [facility], noticed provider advised to recheck PSA 90 days from 12/20 results. Routed to PCP to advise on need for lab order and type of visit needed (lab only vs. OV).

## 2018-10-25 ENCOUNTER — Other Ambulatory Visit: Payer: Self-pay | Admitting: Family Medicine

## 2018-10-25 DIAGNOSIS — R972 Elevated prostate specific antigen [PSA]: Secondary | ICD-10-CM

## 2018-10-25 NOTE — Telephone Encounter (Signed)
I put in the future order for PSA. He can schedule a lab visit only for late March

## 2018-10-25 NOTE — Telephone Encounter (Signed)
Correction: "abstain for two days".

## 2018-10-25 NOTE — Telephone Encounter (Signed)
Author phoned pt. to notify of PSA order and schedule lab visit. Pt. stated he would call back to make lab appointment for late march. Need to fast and abstain from sex for two hours recommended, and pt. verbalized understanding.

## 2018-11-21 NOTE — Progress Notes (Signed)
Nicholas Wilkerson Date of Birth: 1941/06/25   History of Present Illness: Nicholas Wilkerson is seen for followup. He has a history of dilated cardiomyopathy with initial ejection fraction of 30%. Subsequent evaluation has demonstrated improvement to 50-55%. Last Echo was in January 2018 showing decrease in EF to 35-40%. ACEi was resumed at that time. . 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 He was treated with RT and chemo.   On follow up today he is doing very well. He stays very active with chores, walking, and golf.  He denies any chest pain, dyspnea, palpitations. No dizziness or syncope. Has recently been taking a decongestant. No recurrent CA.  Current Outpatient Medications on File Prior to Visit  Medication Sig Dispense Refill  . aspirin EC 81 MG tablet Take 81 mg by mouth daily.    . brimonidine (ALPHAGAN) 0.2 % ophthalmic solution     . carvedilol (COREG) 25 MG tablet TAKE ONE TABLET BY MOUTH TWICE DAILY WITH MEALS -  KEEP  OFFICE  VISIT- 180 tablet 3  . diclofenac (VOLTAREN) 75 MG EC tablet Take 1 tablet (75 mg total) by mouth 2 (two) times daily. (Patient not taking: Reported on 10/24/2018) 180 tablet 1  . latanoprost (XALATAN) 0.005 % ophthalmic solution     . Melatonin 10 MG TABS Take by mouth.    . quinapril (ACCUPRIL) 10 MG tablet Take 1 tablet (10 mg total) by mouth daily. 30 tablet 0  . sodium fluoride (FLUORISHIELD) 1.1 % GEL dental gel Instill one drop of gel per tooth space of fluoride tray. Place over teeth for 5 minutes. Remove. Spit out excess. Repeat nightly. 120 mL prn  . tadalafil (CIALIS) 20 MG tablet Take 1 tablet (20 mg total) by mouth daily as needed for erectile dysfunction. (Patient not taking: Reported on 10/24/2018) 30 tablet 3  . tamsulosin (FLOMAX) 0.4 MG CAPS capsule TAKE 1 CAPSULE BY MOUTH ONCE DAILY 90 capsule 3   No current facility-administered medications on file prior to visit.     No Known Allergies  Past Medical History:  Diagnosis  Date  . ARTHRITIS 10/22/2008  . BPH (benign prostatic hyperplasia)   . CHF (congestive heart failure) [facility])    sees Dr. Elihu Milstein [name]   . Dilated cardiomyopathy ([place])   . DIVERTICULOSIS, COLON 12/08/2007  . Glaucoma    sees Dr. Heather [place]   . H/O asbestos exposure   . History of echocardiogram 05/22/2007   EF was 45-50% / Mild concentric LV hypertrophy with mild global hypokinesis and overall mild systolic dysfunction .  Mild AV sclerosis / Mild Mitral insufficiency / compared to prior study 04/24/02 -- LV function has improved further.    . History of kidney stones   . Hypercholesterolemia   . Hypertension   . Insomnia 06/11/2015  . Radiation 01/01/15-02/18/15   base of tongue and bilateral neck 70 Gy  . Skin cancer    squamous cell, basal cell  . Squamous cell carcinoma 11/20/14   base of tongue primary  . Throat cancer ([place]) 10/2014   had chemo    Past Surgical History:  Procedure Laterality Date  . CARDIAC CATHETERIZATION  10/17/2001   EF estimated at 30% / moderate LV enlargement  / 1. Minimal nonobstructive atherosclerotic coronary artery disease / 2. Severe LV dysfunction with global hypokinesia consistent with dilated nonischemic cardiomyopath / 3. Moderate pulmonary hypertension  . COLONOSCOPY  06/23/2017   per Dr. Carlean Purl, clear, no repeats needed   .  LYMPH NODE BIOPSY      Social History   Tobacco Use  Smoking Status Never Smoker  Smokeless Tobacco Never Used    Social History   Substance and Sexual Activity  Alcohol Use Yes  . Alcohol/week: 0.0 standard drinks   Comment: occasional beer, a couple times a month    Family History  Problem Relation Age of Onset  . Stroke Father   . Cancer Father        kidney ca  . Angina Mother   . Colon cancer Neg Hx   . Esophageal cancer Neg Hx   . Rectal cancer Neg Hx   . Stomach cancer Neg Hx     Review of Systems:  All other systems were reviewed and are negative.  Physical Exam: There were no vitals taken  for this visit. GENERAL:  Well appearing WM in NAD HEENT:  PERRL, EOMI, sclera are clear. Oropharynx is clear. NECK:  No jugular venous distention, carotid upstroke brisk and symmetric, no bruits, no thyromegaly or adenopathy LUNGS:  Clear to auscultation bilaterally CHEST:  Unremarkable HEART:  RRR with extrasystoles,  PMI not displaced or sustained,S1 and S2 within normal limits, no S3, no S4: no clicks, no rubs, no murmurs ABD:  Soft, nontender. BS +, no masses or bruits. No hepatomegaly, no splenomegaly EXT:  2 + pulses throughout, no edema, no cyanosis no clubbing SKIN:  Warm and dry.  No rashes NEURO:  Alert and oriented x 3. Cranial nerves II through XII intact. PSYCH:  Cognitively intact      LABORATORY DATA:  Lab Results  Component Value Date   WBC 6.0 09/29/2018   HGB 17.3 (H) 09/29/2018   HCT 52.2 (H) 09/29/2018   PLT 209.0 09/29/2018   GLUCOSE 99 09/29/2018   CHOL 206 (H) 09/29/2018   TRIG 93.0 09/29/2018   HDL 43.90 09/29/2018   LDLCALC 143 (H) 09/29/2018   ALT 19 09/29/2018   AST 17 09/29/2018   NA 141 09/29/2018   K 4.2 09/29/2018   CL 102 09/29/2018   CREATININE 1.08 09/29/2018   BUN 17 09/29/2018   CO2 32 09/29/2018   TSH 3.94 09/29/2018   PSA 4.29 (H) 09/29/2018   INR 0.95 07/17/2015    Ecg today shows NSR with frequent and consecutive PVCs. LVH.  nonspecific TWA. I have personally reviewed and interpreted this study.  Echo 11/02/16: Study Conclusions  - Left ventricle: The cavity size was mildly dilated. Wall   thickness was normal. Systolic function was moderately reduced.   The estimated ejection fraction was in the range of 35% to 40%.   Diffuse hypokinesis. Echogenic linear structure at the apex,   suggestive of calcified apical false tendon. Doppler parameters   are consistent with pseudonormal left ventricular relaxation   (grade 2 diastolic dysfunction). The E/e&' ratio is >15,   suggesting elevated LV filling pressure. - Mitral valve:  Mildly thickened leaflets . There was mild   regurgitation. - Left atrium: The atrium was mildly dilated. - Inferior vena cava: The vessel was dilated. The respirophasic   diameter changes were blunted (< 50%), consistent with elevated   central venous pressure.  Impressions:  - Compared to a prior study in 2012, the LVEF is lower at 35-40%.   The LV is mildly dilated and globally hypokinetic. LV filling   pressure appears elevated with grade 2 DD.   Assessment / Plan:  1. Dilated cardiomyopathy with EF 35-40%. Well compensated on  long-term carvedilol and ramipril.  He is asymptomatic.   2. Hypertension well controlled.  3. PVCs, completely asymptomatic. Continue carvedilol. Potassium was normal. ? Exacerbated by decongestant   4. SSCA of the tongue. S/p RT and chemo.   Follow up in one year.

## 2018-11-27 ENCOUNTER — Encounter: Payer: Self-pay | Admitting: Cardiology

## 2018-11-27 ENCOUNTER — Ambulatory Visit: Payer: PPO | Admitting: Cardiology

## 2018-11-27 VITALS — BP 104/64 | HR 94 | Ht 69.0 in | Wt 168.6 lb

## 2018-11-27 DIAGNOSIS — I493 Ventricular premature depolarization: Secondary | ICD-10-CM | POA: Diagnosis not present

## 2018-11-27 DIAGNOSIS — I5022 Chronic systolic (congestive) heart failure: Secondary | ICD-10-CM | POA: Diagnosis not present

## 2018-11-27 DIAGNOSIS — I10 Essential (primary) hypertension: Secondary | ICD-10-CM | POA: Diagnosis not present

## 2018-11-27 MED ORDER — QUINAPRIL HCL 10 MG PO TABS: 10.0000 mg | ORAL_TABLET | Freq: Every day | ORAL | 3 refills | Status: DC

## 2018-12-10 ENCOUNTER — Other Ambulatory Visit: Payer: Self-pay | Admitting: Family Medicine

## 2018-12-12 NOTE — Telephone Encounter (Signed)
Dr. Sarajane Jews please advise on alternative to the flomax.  The insurance will cover the finasteride 5 mg.  Please advise thanks

## 2018-12-13 NOTE — Telephone Encounter (Signed)
Stop the Flomax. Call in Finasteride 5 mg daily, #90 with 3 rf

## 2018-12-19 ENCOUNTER — Other Ambulatory Visit: Payer: Self-pay | Admitting: Family Medicine

## 2018-12-19 NOTE — Telephone Encounter (Signed)
The pharmacy stated that the insurance will not cover the flomax.  These are the alternatives:  Finasteride 5 mg Dutasteride 0.5  Silodosin 4 mg and 8 mg.   Dr. Sarajane Jews please advise.

## 2018-12-22 ENCOUNTER — Other Ambulatory Visit: Payer: Self-pay | Admitting: Family Medicine

## 2019-01-22 ENCOUNTER — Other Ambulatory Visit: Payer: PPO

## 2019-02-26 ENCOUNTER — Other Ambulatory Visit: Payer: Self-pay

## 2019-02-26 ENCOUNTER — Inpatient Hospital Stay: Payer: PPO | Attending: Hematology and Oncology | Admitting: Hematology and Oncology

## 2019-02-26 DIAGNOSIS — Z923 Personal history of irradiation: Secondary | ICD-10-CM | POA: Diagnosis not present

## 2019-02-26 DIAGNOSIS — Z79899 Other long term (current) drug therapy: Secondary | ICD-10-CM | POA: Diagnosis not present

## 2019-02-26 DIAGNOSIS — I10 Essential (primary) hypertension: Secondary | ICD-10-CM | POA: Diagnosis not present

## 2019-02-26 DIAGNOSIS — C01 Malignant neoplasm of base of tongue: Secondary | ICD-10-CM

## 2019-02-26 DIAGNOSIS — Z9221 Personal history of antineoplastic chemotherapy: Secondary | ICD-10-CM

## 2019-02-27 ENCOUNTER — Encounter: Payer: Self-pay | Admitting: Hematology and Oncology

## 2019-02-27 NOTE — Assessment & Plan Note (Signed)
Clinically, he has no signs or symptoms of cancer recurrence He is compliant following up with ENT His primary care doctor is monitoring TSH I plan to see him back in 1 year

## 2019-02-27 NOTE — Assessment & Plan Note (Signed)
His blood pressure is mildly elevated He will continue blood pressure management through his primary care doctor

## 2019-02-27 NOTE — Progress Notes (Signed)
[facility] OFFICE PROGRESS NOTE  Patient Care Team: Laurey Morale, MD as PCP - General (Family Medicine) Heath Lark, MD as Consulting Physician (Hematology and Oncology) Melissa Montane, MD as Consulting Physician (Otolaryngology) Eppie Gibson, MD as Attending Physician (Radiation Oncology) Holley Bouche, NP (Inactive) as Nurse Practitioner (Nurse Practitioner) [name], [name], MD as Consulting Physician (Cardiology) [place], Heather, [facility] (Optometry)  ASSESSMENT & PLAN:  Cancer of base of tongue Clinically, he has no signs or symptoms of cancer recurrence He is compliant following up with ENT His primary care doctor is monitoring TSH I plan to see him back in 1 year  Essential hypertension His blood pressure is mildly elevated He will continue blood pressure management through his primary care doctor   No orders of the defined types were placed in this encounter.   INTERVAL HISTORY: Please see below for problem oriented charting. He returns for further follow-up No recent dental issue or tongue lesion No dysphagia Denies new lymphadenopathy in his neck No recent alcohol intake or tobacco use SUMMARY OF ONCOLOGIC HISTORY: Oncology History   Cancer of base of tongue   Staging form: Lip and Oral Cavity, AJCC 7th Edition     Clinical stage from 12/03/2014: Stage IVA (T2, N2a, M0) - Signed by Heath Lark, MD on 12/13/2014 HPV positive by PCR testing.     Cancer of base of tongue ([place])   11/20/2014 Pathology Results    Accession: [id number] confirm squamous cell carcinoma.    11/20/2014 Procedure    The patient underwent fine-needle aspirate of the right lymph node    12/02/2014 Imaging    CT scan showed 2.2 cm the right tongue base mass with necrotic 4 cm right level II nodal metastasis with multiple bilateral small lymphadenopathy    12/10/2014 Imaging    PET CT scan showed tongue base mass with right level II lymph node metastasis    12/18/2014 Pathology Results     PCR results from [facility]: HPV 16 positive.  HPV 18 and other types negative.     12/24/2014 Procedure    He has placement of port and feeding tube    01/01/2015 - 02/12/2015 Chemotherapy    He received high dose cisplatin    01/01/2015 - 02/18/2015 Radiation Therapy    Rec'd concurrent RT to base of tongue and bilateral neck:  70 Gy in 35 fractions to gross disease, 63 Gy in 35 fractions to high risk nodal echelons, 56 Gy in 35 fractions to intermediate risk nodal echelons.    01/21/2015 Adverse Reaction     cycle 2 chemotherapy dose will be reduced by 25% due to intolerable side effects    02/11/2015 Adverse Reaction    Cycle 3 of chemotherapy dose will be reduced to 50% due to intolerable side effects    07/17/2015 Procedure    Both feeding tube and port were removed     REVIEW OF SYSTEMS:   Constitutional: Denies fevers, chills or abnormal weight loss Eyes: Denies blurriness of vision Ears, nose, mouth, throat, and face: Denies mucositis or sore throat Respiratory: Denies cough, dyspnea or wheezes Cardiovascular: Denies palpitation, chest discomfort or lower extremity swelling Gastrointestinal:  Denies nausea, heartburn or change in bowel habits Skin: Denies abnormal skin rashes Lymphatics: Denies new lymphadenopathy or easy bruising Neurological:Denies numbness, tingling or new weaknesses Behavioral/Psych: Mood is stable, no new changes  All other systems were reviewed with the patient and are negative.  I have reviewed the past medical history,  past surgical history, social history and family history with the patient and they are unchanged from previous note.  ALLERGIES:  has No Known Allergies.  MEDICATIONS:  Current Outpatient Medications  Medication Sig Dispense Refill  . aspirin EC 81 MG tablet Take 81 mg by mouth daily.    . brimonidine (ALPHAGAN) 0.2 % ophthalmic solution     . carvedilol (COREG) 25 MG tablet TAKE 1 TABLET BY MOUTH TWICE DAILY WITH MEALS (KEEP   OFFICE  VISIT) 180 tablet 0  . diclofenac (VOLTAREN) 75 MG EC tablet Take 1 tablet (75 mg total) by mouth 2 (two) times daily. 180 tablet 1  . latanoprost (XALATAN) 0.005 % ophthalmic solution     . Melatonin 10 MG TABS Take by mouth.    . quinapril (ACCUPRIL) 10 MG tablet Take 1 tablet (10 mg total) by mouth daily. 90 tablet 3  . sodium fluoride (FLUORISHIELD) 1.1 % GEL dental gel Instill one drop of gel per tooth space of fluoride tray. Place over teeth for 5 minutes. Remove. Spit out excess. Repeat nightly. 120 mL prn  . tadalafil (CIALIS) 20 MG tablet Take 1 tablet (20 mg total) by mouth daily as needed for erectile dysfunction. 30 tablet 3  . tamsulosin (FLOMAX) 0.4 MG CAPS capsule TAKE 1 CAPSULE BY MOUTH ONCE DAILY 90 capsule 3   No current facility-administered medications for this visit.     PHYSICAL EXAMINATION: ECOG PERFORMANCE STATUS: 1 - Symptomatic but completely ambulatory  Vitals:   02/26/19 0907  BP: (!) 146/84  Pulse: 78  Resp: 18  Temp: 98.2 F (36.8 C)  SpO2: 100%   Filed Weights   02/26/19 0907  Weight: 164 lb 12.8 oz (74.8 kg)    GENERAL:alert, no distress and comfortable SKIN: skin color, texture, turgor are normal, no rashes or significant lesions EYES: normal, Conjunctiva are pink and non-injected, sclera clear OROPHARYNX:no exudate, no erythema and lips, buccal mucosa, and tongue normal  NECK: supple, thyroid normal size, non-tender, without nodularity LYMPH:  no palpable lymphadenopathy in the cervical, axillary or inguinal LUNGS: clear to auscultation and percussion with normal breathing effort HEART: regular rate & rhythm and no murmurs and no lower extremity edema ABDOMEN:abdomen soft, non-tender and normal bowel sounds Musculoskeletal:no cyanosis of digits and no clubbing  NEURO: alert & oriented x 3 with fluent speech, no focal motor/sensory deficits  LABORATORY DATA:  I have reviewed the data as listed    Component Value Date/Time   NA 141  09/29/2018 0953   NA 141 12/12/2015 1028   K 4.2 09/29/2018 0953   K 4.7 12/12/2015 1028   CL 102 09/29/2018 0953   CO2 32 09/29/2018 0953   CO2 28 12/12/2015 1028   GLUCOSE 99 09/29/2018 0953   GLUCOSE 103 12/12/2015 1028   BUN 17 09/29/2018 0953   BUN 15.2 06/21/2016 0849   CREATININE 1.08 09/29/2018 0953   CREATININE 1.35 (H) 11/19/2016 1154   CREATININE 1.3 06/21/2016 0849   CALCIUM 9.4 09/29/2018 0953   CALCIUM 9.1 12/12/2015 1028   PROT 6.5 09/29/2018 0953   PROT 6.8 12/12/2015 1028   ALBUMIN 4.4 09/29/2018 0953   ALBUMIN 3.8 12/12/2015 1028   AST 17 09/29/2018 0953   AST 17 12/12/2015 1028   ALT 19 09/29/2018 0953   ALT 19 12/12/2015 1028   ALKPHOS 63 09/29/2018 0953   ALKPHOS 96 12/12/2015 1028   BILITOT 0.6 09/29/2018 0953   BILITOT 0.46 12/12/2015 1028   GFRNONAA 45 (L) 04/14/2015 1042  GFRAA 53 (L) 04/14/2015 1042    No results found for: SPEP, UPEP  Lab Results  Component Value Date   WBC 6.0 09/29/2018   NEUTROABS 4.3 09/29/2018   HGB 17.3 (H) 09/29/2018   HCT 52.2 (H) 09/29/2018   MCV 92.2 09/29/2018   PLT 209.0 09/29/2018      Chemistry      Component Value Date/Time   NA 141 09/29/2018 0953   NA 141 12/12/2015 1028   K 4.2 09/29/2018 0953   K 4.7 12/12/2015 1028   CL 102 09/29/2018 0953   CO2 32 09/29/2018 0953   CO2 28 12/12/2015 1028   BUN 17 09/29/2018 0953   BUN 15.2 06/21/2016 0849   CREATININE 1.08 09/29/2018 0953   CREATININE 1.35 (H) 11/19/2016 1154   CREATININE 1.3 06/21/2016 0849      Component Value Date/Time   CALCIUM 9.4 09/29/2018 0953   CALCIUM 9.1 12/12/2015 1028   ALKPHOS 63 09/29/2018 0953   ALKPHOS 96 12/12/2015 1028   AST 17 09/29/2018 0953   AST 17 12/12/2015 1028   ALT 19 09/29/2018 0953   ALT 19 12/12/2015 1028   BILITOT 0.6 09/29/2018 0953   BILITOT 0.46 12/12/2015 1028       All questions were answered. The patient knows to call the clinic with any problems, questions or concerns. No barriers to  learning was detected.  I spent 10 minutes counseling the patient face to face. The total time spent in the appointment was 15 minutes and more than 50% was on counseling and review of test results  Heath Lark, MD 02/27/2019 10:28 AM

## 2019-02-28 ENCOUNTER — Telehealth: Payer: Self-pay | Admitting: Hematology and Oncology

## 2019-02-28 NOTE — Telephone Encounter (Signed)
Called regarding schedule °

## 2019-03-26 ENCOUNTER — Other Ambulatory Visit: Payer: Self-pay

## 2019-03-26 ENCOUNTER — Other Ambulatory Visit (INDEPENDENT_AMBULATORY_CARE_PROVIDER_SITE_OTHER): Payer: PPO

## 2019-03-26 DIAGNOSIS — R972 Elevated prostate specific antigen [PSA]: Secondary | ICD-10-CM | POA: Diagnosis not present

## 2019-03-26 LAB — PSA: PSA: 2.95 ng/mL (ref 0.10–4.00)

## 2019-03-30 ENCOUNTER — Encounter: Payer: Self-pay | Admitting: *Deleted

## 2019-04-15 ENCOUNTER — Other Ambulatory Visit: Payer: Self-pay | Admitting: Family Medicine

## 2019-04-23 DIAGNOSIS — Z85810 Personal history of malignant neoplasm of tongue: Secondary | ICD-10-CM | POA: Diagnosis not present

## 2019-04-26 NOTE — Telephone Encounter (Signed)
Pt calling about refill request, he has not heard anything, and is almost out of meds.

## 2019-06-04 DIAGNOSIS — H401131 Primary open-angle glaucoma, bilateral, mild stage: Secondary | ICD-10-CM | POA: Diagnosis not present

## 2019-08-06 DIAGNOSIS — D1801 Hemangioma of skin and subcutaneous tissue: Secondary | ICD-10-CM | POA: Diagnosis not present

## 2019-08-06 DIAGNOSIS — L821 Other seborrheic keratosis: Secondary | ICD-10-CM | POA: Diagnosis not present

## 2019-08-06 DIAGNOSIS — Z85828 Personal history of other malignant neoplasm of skin: Secondary | ICD-10-CM | POA: Diagnosis not present

## 2019-08-06 DIAGNOSIS — L814 Other melanin hyperpigmentation: Secondary | ICD-10-CM | POA: Diagnosis not present

## 2019-08-06 DIAGNOSIS — L570 Actinic keratosis: Secondary | ICD-10-CM | POA: Diagnosis not present

## 2019-08-06 DIAGNOSIS — D225 Melanocytic nevi of trunk: Secondary | ICD-10-CM | POA: Diagnosis not present

## 2019-08-06 DIAGNOSIS — C44311 Basal cell carcinoma of skin of nose: Secondary | ICD-10-CM | POA: Diagnosis not present

## 2019-08-30 ENCOUNTER — Telehealth: Payer: Self-pay | Admitting: Cardiology

## 2019-08-30 DIAGNOSIS — R002 Palpitations: Secondary | ICD-10-CM

## 2019-08-30 NOTE — Telephone Encounter (Signed)
Spoke to patient Dr.Jordan's recommendation given.Advised I will place order for a 7 day Zio monitor.Monitor tech will mail monitor and give you a call on how to appy.

## 2019-08-30 NOTE — Telephone Encounter (Signed)
He does have a history of PVCs but a Zio patch monitor would be helpful to make sure he is not having any Afib  Adama Ferber [name] MD, [facility]

## 2019-08-30 NOTE — Telephone Encounter (Signed)
Pt called to report that he has had some on and off palpitations... he denies dizziness, sob, chest pain.. he says they do not last long and have ben on and off for several days..   No change in diet... no added caffeine, no OTC meds such as decongestants.   He says he is very active and they are mostly at rest. He feels well otherwise.   He says he does not drink enough fluids due to prostate issues. I advised him to be sure he is hydrating better.   Pt does not want to see an APP.. he does not want to wait until Dr. Doug Sou next available 09/28/19 although I out him in in case that is the best option.   He is nervous he will have a stroke untreated.. I reassured him and he is taking his ASA 81mg .   Will forward to Dr. [name] for review.   Pt had abnormal TSH 2018... seeing Dr. Sarajane Jews for physical 10/15/2019.Marland Kitchen   Pt asking for possible monitor  And will call him after Dr. Doug Sou recommendations.

## 2019-08-30 NOTE — Telephone Encounter (Signed)
Patient c/o Palpitations:  High priority if patient c/o lightheadedness, shortness of breath, or chest pain  1) How long have you had palpitations/irregular HR/ Afib? Are you having the symptoms now? No, Irregular HR every now and then   2) Are you currently experiencing lightheadedness, SOB or CP? no  3) Do you have a history of afib (atrial fibrillation) or irregular heart rhythm? Yes, irregular HR  4) Have you checked your BP or HR? (document readings if available): 129/74 last week but has not checked it this week. Michela Pitcher he will check it when we get off the phone so he can have the reading ready when a nurse calls him.   5) Are you experiencing any other symptoms? No   Patient feels like he needs to be seen soon. Does not want 09/28/19 appt at 8:00am nor does he want to see an APP. He is due for his yearly f/u in February

## 2019-09-03 DIAGNOSIS — H401131 Primary open-angle glaucoma, bilateral, mild stage: Secondary | ICD-10-CM | POA: Diagnosis not present

## 2019-09-05 ENCOUNTER — Telehealth: Payer: Self-pay

## 2019-09-05 NOTE — Telephone Encounter (Signed)
7 day ZIO XT ordered and mailed to pt.

## 2019-09-17 ENCOUNTER — Ambulatory Visit (INDEPENDENT_AMBULATORY_CARE_PROVIDER_SITE_OTHER): Payer: PPO

## 2019-09-17 DIAGNOSIS — R002 Palpitations: Secondary | ICD-10-CM | POA: Diagnosis not present

## 2019-09-26 NOTE — Progress Notes (Signed)
Nicholas Wilkerson Date of Birth: 03-30-1941   History of Present Illness: Nicholas Wilkerson is seen for followup. He has a history of dilated cardiomyopathy with initial ejection fraction of 30%. Subsequent evaluation has demonstrated improvement to 50-55%. Last Echo was in January 2018 showing decrease in EF to 35-40%. ACEi was resumed at that time. . 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 He was treated with RT and chemo.   On follow up today he is doing very well. He did call in November with symptoms of palpitations. Notes skipped beats followed by racing for a couple of beats. He has just finished wearing a Zio patch monitor. Results are pending. He stays very active with chores, walking, and golf. Just finished doing major renovations in his house which he did all on his own.   He denies any chest pain, dyspnea. No dizziness or syncope. No recurrent CA.  Current Outpatient Medications on File Prior to Visit  Medication Sig Dispense Refill  . aspirin EC 81 MG tablet Take 81 mg by mouth daily.    . brimonidine (ALPHAGAN) 0.2 % ophthalmic solution     . carvedilol (COREG) 25 MG tablet TAKE 1 TABLET BY MOUTH TWICE DAILY WITH MEALS . APPOINTMENT REQUIRED FOR FUTURE REFILLS 180 tablet 3  . diclofenac (VOLTAREN) 75 MG EC tablet Take 1 tablet (75 mg total) by mouth 2 (two) times daily. 180 tablet 1  . latanoprost (XALATAN) 0.005 % ophthalmic solution     . Melatonin 10 MG TABS Take by mouth.    . quinapril (ACCUPRIL) 10 MG tablet Take 1 tablet (10 mg total) by mouth daily. 90 tablet 3  . sodium fluoride (FLUORISHIELD) 1.1 % GEL dental gel Instill one drop of gel per tooth space of fluoride tray. Place over teeth for 5 minutes. Remove. Spit out excess. Repeat nightly. 120 mL prn  . tadalafil (CIALIS) 20 MG tablet Take 1 tablet (20 mg total) by mouth daily as needed for erectile dysfunction. 30 tablet 3  . tamsulosin (FLOMAX) 0.4 MG CAPS capsule TAKE 1 CAPSULE BY MOUTH ONCE DAILY 90  capsule 3   No current facility-administered medications on file prior to visit.    No Known Allergies  Past Medical History:  Diagnosis Date  . ARTHRITIS 10/22/2008  . BPH (benign prostatic hyperplasia)   . CHF (congestive heart failure) [facility])    sees Dr. Ibn Stief [name]   . Dilated cardiomyopathy ([place])   . DIVERTICULOSIS, COLON 12/08/2007  . Glaucoma    sees Dr. Heather [place]   . H/O asbestos exposure   . History of echocardiogram 05/22/2007   EF was 45-50% / Mild concentric LV hypertrophy with mild global hypokinesis and overall mild systolic dysfunction .  Mild AV sclerosis / Mild Mitral insufficiency / compared to prior study 04/24/02 -- LV function has improved further.    . History of kidney stones   . Hypercholesterolemia   . Hypertension   . Insomnia 06/11/2015  . Radiation 01/01/15-02/18/15   base of tongue and bilateral neck 70 Gy  . Skin cancer    squamous cell, basal cell  . Squamous cell carcinoma 11/20/14   base of tongue primary  . Throat cancer ([place]) 10/2014   had chemo    Past Surgical History:  Procedure Laterality Date  . CARDIAC CATHETERIZATION  10/17/2001   EF estimated at 30% / moderate LV enlargement  / 1. Minimal nonobstructive atherosclerotic coronary artery disease / 2. Severe LV dysfunction  with global hypokinesia consistent with dilated nonischemic cardiomyopath / 3. Moderate pulmonary hypertension  . COLONOSCOPY  06/23/2017   per Dr. Carlean Purl, clear, no repeats needed   . LYMPH NODE BIOPSY      Social History   Tobacco Use  Smoking Status Never Smoker  Smokeless Tobacco Never Used    Social History   Substance and Sexual Activity  Alcohol Use Yes  . Alcohol/week: 0.0 standard drinks   Comment: occasional beer, a couple times a month    Family History  Problem Relation Age of Onset  . Stroke Father   . Cancer Father        kidney ca  . Angina Mother   . Colon cancer Neg Hx   . Esophageal cancer Neg Hx   . Rectal cancer Neg Hx   .  Stomach cancer Neg Hx     Review of Systems:  All other systems were reviewed and are negative.  Physical Exam: BP 138/80   Pulse 71   Ht 5\' 8"  (1.727 m)   Wt 163 lb 3.2 oz (74 kg)   SpO2 98%   BMI 24.81 kg/m  GENERAL:  Well appearing WM in NAD HEENT:  PERRL, EOMI, sclera are clear. Oropharynx is clear. NECK:  No jugular venous distention, carotid upstroke brisk and symmetric, no bruits, no thyromegaly or adenopathy LUNGS:  Clear to auscultation bilaterally CHEST:  Unremarkable HEART:  RRR with extrasystoles,  PMI not displaced or sustained,S1 and S2 within normal limits, no S3, no S4: no clicks, no rubs, no murmurs ABD:  Soft, nontender. BS +, no masses or bruits. No hepatomegaly, no splenomegaly EXT:  2 + pulses throughout, no edema, no cyanosis no clubbing SKIN:  Warm and dry.  No rashes NEURO:  Alert and oriented x 3. Cranial nerves II through XII intact. PSYCH:  Cognitively intact      LABORATORY DATA:  Lab Results  Component Value Date   WBC 6.0 09/29/2018   HGB 17.3 (H) 09/29/2018   HCT 52.2 (H) 09/29/2018   PLT 209.0 09/29/2018   GLUCOSE 99 09/29/2018   CHOL 206 (H) 09/29/2018   TRIG 93.0 09/29/2018   HDL 43.90 09/29/2018   LDLCALC 143 (H) 09/29/2018   ALT 19 09/29/2018   AST 17 09/29/2018   NA 141 09/29/2018   K 4.2 09/29/2018   CL 102 09/29/2018   CREATININE 1.08 09/29/2018   BUN 17 09/29/2018   CO2 32 09/29/2018   TSH 3.94 09/29/2018   PSA 2.95 03/26/2019   INR 0.95 07/17/2015    Ecg today shows NSR with oc PVCs. LVH. Inferolateral ST-T wave changes.  I have personally reviewed and interpreted this study.  Echo 11/02/16: Study Conclusions  - Left ventricle: The cavity size was mildly dilated. Wall   thickness was normal. Systolic function was moderately reduced.   The estimated ejection fraction was in the range of 35% to 40%.   Diffuse hypokinesis. Echogenic linear structure at the apex,   suggestive of calcified apical false tendon. Doppler  parameters   are consistent with pseudonormal left ventricular relaxation   (grade 2 diastolic dysfunction). The E/e&' ratio is >15,   suggesting elevated LV filling pressure. - Mitral valve: Mildly thickened leaflets . There was mild   regurgitation. - Left atrium: The atrium was mildly dilated. - Inferior vena cava: The vessel was dilated. The respirophasic   diameter changes were blunted (< 50%), consistent with elevated   central venous pressure.  Impressions:  - Compared  to a prior study in 2012, the LVEF is lower at 35-40%.   The LV is mildly dilated and globally hypokinetic. LV filling   pressure appears elevated with grade 2 DD.   Assessment / Plan:  1. Dilated cardiomyopathy with EF 35-40%. Well compensated on  long-term carvedilol and ramipril. He is asymptomatic.   2. Hypertension well controlled.  3. PVCs. Recent symptoms of palpitations. Zio patch monitor is pending. Will allow [place] to see burden of PVCs and make sure he isn't having other arrythmias.  Continue carvedilol.   4. SSCA of the tongue. S/p RT and chemo.   He is scheduled for complete labs with his primary care in January.

## 2019-09-28 ENCOUNTER — Encounter: Payer: Self-pay | Admitting: Cardiology

## 2019-09-28 ENCOUNTER — Other Ambulatory Visit: Payer: Self-pay

## 2019-09-28 ENCOUNTER — Ambulatory Visit: Payer: PPO | Admitting: Cardiology

## 2019-09-28 VITALS — BP 138/80 | HR 71 | Ht 68.0 in | Wt 163.2 lb

## 2019-09-28 DIAGNOSIS — I493 Ventricular premature depolarization: Secondary | ICD-10-CM

## 2019-09-28 DIAGNOSIS — I10 Essential (primary) hypertension: Secondary | ICD-10-CM

## 2019-09-28 DIAGNOSIS — I5022 Chronic systolic (congestive) heart failure: Secondary | ICD-10-CM

## 2019-09-28 DIAGNOSIS — I428 Other cardiomyopathies: Secondary | ICD-10-CM | POA: Diagnosis not present

## 2019-10-02 DIAGNOSIS — R002 Palpitations: Secondary | ICD-10-CM | POA: Diagnosis not present

## 2019-10-04 ENCOUNTER — Telehealth: Payer: Self-pay

## 2019-10-04 MED ORDER — CARVEDILOL 25 MG PO TABS: ORAL_TABLET | ORAL | 3 refills | Status: DC

## 2019-10-04 NOTE — Telephone Encounter (Signed)
Spoke to patient monitor results given.Advised to increase carvedilol 25 mg 1&1/2 tablets twice a day.

## 2019-10-15 ENCOUNTER — Other Ambulatory Visit: Payer: Self-pay

## 2019-10-15 ENCOUNTER — Ambulatory Visit (INDEPENDENT_AMBULATORY_CARE_PROVIDER_SITE_OTHER): Payer: PPO | Admitting: Family Medicine

## 2019-10-15 ENCOUNTER — Encounter: Payer: Self-pay | Admitting: Family Medicine

## 2019-10-15 VITALS — BP 120/60 | HR 70 | Temp 97.8°F | Ht 68.0 in | Wt 161.6 lb

## 2019-10-15 DIAGNOSIS — Z23 Encounter for immunization: Secondary | ICD-10-CM | POA: Diagnosis not present

## 2019-10-15 DIAGNOSIS — Z0000 Encounter for general adult medical examination without abnormal findings: Secondary | ICD-10-CM | POA: Diagnosis not present

## 2019-10-15 DIAGNOSIS — R972 Elevated prostate specific antigen [PSA]: Secondary | ICD-10-CM | POA: Diagnosis not present

## 2019-10-15 LAB — LIPID PANEL
Cholesterol: 212 mg/dL — ABNORMAL HIGH (ref 0–200)
HDL: 41.7 mg/dL (ref >39.00)
LDL Cholesterol: 150 mg/dL — ABNORMAL HIGH (ref 0–99)
NonHDL: 170.16
Total CHOL/HDL Ratio: 5
Triglycerides: 102 mg/dL (ref 0.0–149.0)
VLDL: 20.4 mg/dL (ref 0.0–40.0)

## 2019-10-15 LAB — HEPATIC FUNCTION PANEL
ALT: 16 U/L (ref 0–53)
AST: 16 U/L (ref 0–37)
Albumin: 4.3 g/dL (ref 3.5–5.2)
Alkaline Phosphatase: 69 U/L (ref 39–117)
Bilirubin, Direct: 0.1 mg/dL (ref 0.0–0.3)
Total Bilirubin: 0.6 mg/dL (ref 0.2–1.2)
Total Protein: 6.3 g/dL (ref 6.0–8.3)

## 2019-10-15 LAB — CBC WITH DIFFERENTIAL/PLATELET
Basophils Absolute: 0.1 10*3/uL (ref 0.0–0.1)
Basophils Relative: 0.9 % (ref 0.0–3.0)
Eosinophils Absolute: 0.1 10*3/uL (ref 0.0–0.7)
Eosinophils Relative: 1.1 % (ref 0.0–5.0)
HCT: 50 % (ref 39.0–52.0)
Hemoglobin: 16.4 g/dL (ref 13.0–17.0)
Lymphocytes Relative: 13.3 % (ref 12.0–46.0)
Lymphs Abs: 0.9 10*3/uL (ref 0.7–4.0)
MCHC: 32.7 g/dL (ref 30.0–36.0)
MCV: 91.6 fl (ref 78.0–100.0)
Monocytes Absolute: 0.6 10*3/uL (ref 0.1–1.0)
Monocytes Relative: 8.6 % (ref 3.0–12.0)
Neutro Abs: 5.1 10*3/uL (ref 1.4–7.7)
Neutrophils Relative %: 76.1 % (ref 43.0–77.0)
Platelets: 184 10*3/uL (ref 150.0–400.0)
RBC: 5.46 Mil/uL (ref 4.22–5.81)
RDW: 13.6 % (ref 11.5–15.5)
WBC: 6.7 10*3/uL (ref 4.0–10.5)

## 2019-10-15 LAB — TSH: TSH: 4.82 u[IU]/mL — ABNORMAL HIGH (ref 0.35–4.50)

## 2019-10-15 LAB — BASIC METABOLIC PANEL
BUN: 17 mg/dL (ref 6–23)
CO2: 31 mEq/L (ref 19–32)
Calcium: 9.5 mg/dL (ref 8.4–10.5)
Chloride: 103 mEq/L (ref 96–112)
Creatinine, Ser: 1.19 mg/dL (ref 0.40–1.50)
GFR: 59.42 mL/min — ABNORMAL LOW (ref >60.00)
Glucose, Bld: 114 mg/dL — ABNORMAL HIGH (ref 70–99)
Potassium: 4.3 mEq/L (ref 3.5–5.1)
Sodium: 141 mEq/L (ref 135–145)

## 2019-10-15 LAB — PSA: PSA: 7.93 ng/mL — ABNORMAL HIGH (ref 0.10–4.00)

## 2019-10-15 NOTE — Progress Notes (Signed)
Subjective:    Patient ID: Nicholas Wilkerson, male    DOB: [birth year], 79 y.o.   MRN: [id number]  HPI Here for a well exam. He feels great and has no concerns. He stays active and he recently completed extensive renovations on his house which he does himself. He saw Dr. [name] recently, and he seemed to be pleased with [name]'s cardiovascular status. His last ECHO in 2018 showed an LVEF of 35-40%. His BP is stable. He remains in remission after treatment for oral squamous cell cancer. He follow up with Dr. Alvy Bimler and Dr. Janace Hoard regularly.    Review of Systems  Constitutional: Negative.   HENT: Negative.   Eyes: Negative.   Respiratory: Negative.   Cardiovascular: Negative.   Gastrointestinal: Negative.   Genitourinary: Negative.   Musculoskeletal: Negative.   Skin: Negative.   Neurological: Negative.   Psychiatric/Behavioral: Negative.        Objective:   Physical Exam Constitutional:      General: He is not in acute distress.    Appearance: He is well-developed. He is not diaphoretic.  HENT:     Head: Normocephalic and atraumatic.     Right Ear: External ear normal.     Left Ear: External ear normal.     Nose: Nose normal.     Mouth/Throat:     Pharynx: No oropharyngeal exudate.  Eyes:     General: No scleral icterus.       Right eye: No discharge.        Left eye: No discharge.     Conjunctiva/sclera: Conjunctivae normal.     Pupils: Pupils are equal, round, and reactive to light.  Neck:     Thyroid: No thyromegaly.     Vascular: No JVD.     Trachea: No tracheal deviation.  Cardiovascular:     Rate and Rhythm: Normal rate and regular rhythm.     Heart sounds: Normal heart sounds. No murmur. No friction rub. No gallop.   Pulmonary:     Effort: Pulmonary effort is normal. No respiratory distress.     Breath sounds: Normal breath sounds. No wheezing or rales.  Chest:     Chest wall: No tenderness.  Abdominal:     General: Bowel sounds are normal. There is no  distension.     Palpations: Abdomen is soft. There is no mass.     Tenderness: There is no abdominal tenderness. There is no guarding or rebound.  Genitourinary:    Penis: Normal. No tenderness.      Prostate: Normal.     Rectum: Normal. Guaiac result negative.  Musculoskeletal:        General: No tenderness. Normal range of motion.     Cervical back: Neck supple.  Lymphadenopathy:     Cervical: No cervical adenopathy.  Skin:    General: Skin is warm and dry.     Coloration: Skin is not pale.     Findings: No erythema or rash.  Neurological:     Mental Status: He is alert and oriented to person, place, and time.     Cranial Nerves: No cranial nerve deficit.     Motor: No abnormal muscle tone.     Coordination: Coordination normal.     Deep Tendon Reflexes: Reflexes are normal and symmetric. Reflexes normal.  Psychiatric:        Behavior: Behavior normal.        Thought Content: Thought content normal.        Judgment:  Judgment normal.           Assessment & Plan:  Well exam. We discussed diet and exercise. Get fasting labs.  Alysia Penna, MD

## 2019-10-16 ENCOUNTER — Telehealth: Payer: Self-pay

## 2019-10-16 ENCOUNTER — Other Ambulatory Visit: Payer: Self-pay

## 2019-10-16 MED ORDER — LEVOTHYROXINE SODIUM 75 MCG PO TABS: 75.0000 ug | ORAL_TABLET | Freq: Every day | ORAL | 3 refills | Status: DC

## 2019-10-16 NOTE — Addendum Note (Signed)
Addended by: Alysia Penna A on: 10/16/2019 02:41 PM   Modules accepted: Orders

## 2019-10-16 NOTE — Telephone Encounter (Signed)
Copied from [place] [id number]. Topic: General - Other >> Oct 16, 2019  9:25 AM Rainey Pines A wrote: Patient called to let nurse know that his PSA is up after his physical yesterday and he would like to go see Dr.Lester Alinda Money. Please advise

## 2019-10-16 NOTE — Telephone Encounter (Signed)
I put in the order to refer him to Urology earlier today. He is requesting to see Dr. Raynelle Bring. Could you arrange that please? Thanks

## 2019-11-29 ENCOUNTER — Telehealth: Payer: Self-pay | Admitting: Family Medicine

## 2019-11-29 NOTE — Progress Notes (Signed)
  Chronic Care Management   Outreach Note  11/29/2019 Name: Nicholas Wilkerson MRN: [id number] DOB: 10-Dec-1940  Referred by: Laurey Morale, MD Reason for referral : No chief complaint on file.   An unsuccessful telephone outreach was attempted today. The patient was referred to the pharmacist for assistance with care management and care coordination.   Follow Up Plan:   Raynicia Dukes [employer] Scheduler

## 2019-11-29 NOTE — Chronic Care Management (AMB) (Signed)
  Chronic Care Management   Note  11/29/2019 Name: MANJINDER BREAU MRN: [id number] DOB: 1941-02-23  ABDELAZIZ WESTENBERGER is a 79 y.o. year old male who is a primary care patient of Laurey Morale, MD. I reached out to [name] by phone today in response to a referral sent by Mr. Sandy Salaam Hulen's PCP, Laurey Morale, MD.   Mr. Vorhees was given information about Chronic Care Management services today including:  1. CCM service includes personalized support from designated clinical staff supervised by his physician, including individualized plan of care and coordination with other care providers 2. 24/7 contact phone numbers for assistance for urgent and routine care needs. 3. Service will only be billed when office clinical staff spend 20 minutes or more in a month to coordinate care. 4. Only one practitioner may furnish and bill the service in a calendar month. 5. The patient may stop CCM services at any time (effective at the end of the month) by phone call to the office staff. 6. The patient will be responsible for cost sharing (co-pay) of up to 20% of the service fee (after annual deductible is met).  Patient agreed to services and verbal consent obtained.   Follow up plan:   Raynicia Dukes [employer] Scheduler

## 2019-12-04 ENCOUNTER — Telehealth: Payer: Self-pay

## 2019-12-04 DIAGNOSIS — I10 Essential (primary) hypertension: Secondary | ICD-10-CM

## 2019-12-04 DIAGNOSIS — E7800 Pure hypercholesterolemia, unspecified: Secondary | ICD-10-CM

## 2019-12-04 NOTE — Telephone Encounter (Signed)
I am requesting an ambulatory referral to CCM be placed for this patient. Referral will need to have 2 current diagnosis attached to it. Thank you!  

## 2019-12-05 DIAGNOSIS — N402 Nodular prostate without lower urinary tract symptoms: Secondary | ICD-10-CM | POA: Diagnosis not present

## 2019-12-05 DIAGNOSIS — R972 Elevated prostate specific antigen [PSA]: Secondary | ICD-10-CM | POA: Diagnosis not present

## 2019-12-07 ENCOUNTER — Other Ambulatory Visit: Payer: Self-pay

## 2019-12-07 ENCOUNTER — Ambulatory Visit: Payer: PPO

## 2019-12-07 DIAGNOSIS — E7800 Pure hypercholesterolemia, unspecified: Secondary | ICD-10-CM

## 2019-12-07 DIAGNOSIS — E039 Hypothyroidism, unspecified: Secondary | ICD-10-CM

## 2019-12-07 DIAGNOSIS — I10 Essential (primary) hypertension: Secondary | ICD-10-CM

## 2019-12-07 DIAGNOSIS — I5022 Chronic systolic (congestive) heart failure: Secondary | ICD-10-CM

## 2019-12-07 NOTE — Chronic Care Management (AMB) (Signed)
Chronic Care Management Pharmacy  Name: TARIS VILLALVA  MRN: [id number] DOB: 1941/06/04  Initial Questions: 1. Have you seen any other providers since your last visit? NA 2. Any changes in your medicines or health? No   Chief Complaint/ HPI DEVIN MARSCHALL,  79 y.o. , male presents for their Initial CCM visit with the clinical pharmacist via telephone due to COVID-19 Pandemic.  PCP : Laurey Morale, MD  Their chronic conditions include: HTN, CHF, Arthritis, Hypercholesterolemia, Insomnia, Hypothyroidism, Glaucoma,  Prostate   Office Visits: 10/15/2019- Patient presented for wellness exam. Diet and exercise was discussed. Patient to obtain fasting labs.   Consult Visit: 11/12/2019- Dentistry- Patient presented to [facility] Dr. Aline August for tooth restoration.   10/04/2019- Patient instructed to increase carvedilol 25mg , 1.5 tablet twice daily.   09/28/2019- Cardiology- Patient presented in office to Dr. Peter [name] for dilated cardiomyopathy follow up. Patient presented stable (asymptomatic). Patient to continue carvedilol and ramipril. Zio patch monitor pending.   09/20/2019- Dentistry- Patient presented to [facility] Dr. Aline August for tooth restoration.   09/17/2019- Cardiology- Patient presented to Dr. Peter [name] for palpitation. (unable to see notes).   09/03/2019- Optometry- Patient presented to Dr. Heather [place] for primary open- angle glaucoma. (unable to see notes).   Medications: Outpatient Encounter Medications as of 12/07/2019  Medication Sig  . aspirin EC 81 MG tablet Take 81 mg by mouth daily.  . brimonidine (ALPHAGAN) 0.2 % ophthalmic solution   . carvedilol (COREG) 25 MG tablet Take 25 mg 1&1/2 tablets twice a day  . Cholecalciferol (VITAMIN D3) 50 MCG (2000 UT) TABS Take 1 tablet by mouth daily.  Marland Kitchen GARLIC PO Take 1 tablet by mouth daily.  Marland Kitchen latanoprost (XALATAN) 0.005 % ophthalmic solution   . levothyroxine (SYNTHROID) 75 MCG tablet Take 1 tablet (75 mcg  total) by mouth daily.  . Melatonin 10 MG TABS Take by mouth.  . Multiple Vitamin (MULTIVITAMIN) tablet Take 1 tablet by mouth daily.  . quinapril (ACCUPRIL) 10 MG tablet Take 1 tablet (10 mg total) by mouth daily.  . sodium fluoride (FLUORISHIELD) 1.1 % GEL dental gel Instill one drop of gel per tooth space of fluoride tray. Place over teeth for 5 minutes. Remove. Spit out excess. Repeat nightly.  . [DISCONTINUED] tamsulosin (FLOMAX) 0.4 MG CAPS capsule TAKE 1 CAPSULE BY MOUTH ONCE DAILY  . diclofenac (VOLTAREN) 75 MG EC tablet Take 1 tablet (75 mg total) by mouth 2 (two) times daily. (Patient not taking: Reported on 12/07/2019)  . tadalafil (CIALIS) 20 MG tablet Take 1 tablet (20 mg total) by mouth daily as needed for erectile dysfunction. (Patient not taking: Reported on 12/07/2019)   No facility-administered encounter medications on file as of 12/07/2019.     Current Diagnosis/Assessment:  Goals Addressed            This Visit's Progress   . Pharmacy Care Plan       Current Barriers:  . Chronic Disease Management support, education, and care coordination needs related to CHF, HTN, and HLD, Insomnia, Hypothyroidism, Glaucoma, Prostate  Pharmacist Clinical Goal(s):  . High blood pressure:  o Maintain blood pressure within goal of your provider (130/80 or 140/90)  . Maintaining a low sodium diet with goal of less than 1500mg  per day . Continue working on lifestyle modifications (diet/exercise). . Insomnia: Continue to see improvement in insomnia. Marland Kitchen Heart failure: weigh yourself daily.  . High cholesterol:  o Cholesterol goals: Total Cholesterol goal under 200, Triglycerides goal  under 150, HDL goal above 40 (men) or above 50 (women), LDL goal under 100.  Marland Kitchen Hypothyroidism: Maintain Vitamin D-25 level at or above 30.  . Arthritis: Minimize pain level.   Interventions: . Comprehensive medication review performed. Marland Kitchen Heart failure:  o If you gain more than 3 pounds in one day or 5  pounds in one week call your doctor . High blood pressure:  o  DASH diet:  following a diet emphasizing fruits and vegetables and low-fat dairy products along with whole grains, fish, poultry, and nuts. Reducing red meats and sugars.  o Reducing the amount of salt intake to 1500mg /per day. o Recommend using a salt substitute to replace your salt if you need flavor.  o Continue: Carvedilol 25mg . 1.5 tablets twice daily, Quinapril 10mg . 1 tablet once daily  . High cholesterol:  o We discussed: to improve reduce cholesterol levels: focus on a diet high in plant sterols (fruits/vegetables/nuts/whole grains/legumes) and increasing fiber to a daily intake of 10-25g/day.  o Continue: with diet modifications. Repeat blood work due April 2021.  Marland Kitchen Discussed lifestyle modifications including exercise. Continue engaging in at least 150 minutes per week of moderate-intensity exercise such as brisk walking (15- to 20-minute mile) or something similar. Recommended they incorporate flexibility, balance, and some type of strength training exercises.  Please begin slowly and build up gradually.  I discouraged prolonged sitting times.    . Hypothyroidism:  o Continue levothyroxine 57mcg, 1 tablet once daily.  o Repeat lab work due April 2021.  Marland Kitchen Insomnia:  o Discussed sleep hygiene for insomnia. (Avoid napping, limit exposure to technology near bedtime, etc). o Continue melatonin 10mg , 1 tablet daily.  . Prostate health:  o Continue tamsulosin 0.4mg , 1 capsule daily.  . Arthritis: consider taking Tylenol as needed for pain.  . Glaucoma:  o Continue eye drops (brimonidine (Alphagan) 0.2% and latanoprost (Xalatan) 0.005%).  Marland Kitchen Discussed importance of taking medications as directed.   Patient Self Care Activities:  . Calls provider office for new concerns or questions . Continue current medications as directed by providers.  . Continue following up with specialists. . Continue at home blood pressure  readings. . Continue engaging in at least 150 minutes per week of moderate-intensity exercise such as brisk walking (15- to 20-minute mile) or something similar.  . Lab appointment needed in April for repeat lipid panel and TSH.   Initial goal documentation        Heart Failure  Patient notes being diagnosed about 20 years ago.   Last ejection fraction: 35-40% (11/02/2016)   NYHA Class: I (no actitivty limitation) AHA HF Stage: B (Heart disease present - no symptoms present)  Patient has failed these meds in past:  Patient is currently controlled on the following medications:  - Carvedilol 25mg , 1.5 tablets twice daily (dose increased 2 months ago)  - Quinapril 10mg , 1 tablet once daily  We discussed diet and exercise extensively and weighing daily; if you gain more than 3 pounds in one day or 5 pounds in one week call your doctor  Plan Patient denies issues of swelling.  Continue current medications.    Hypertension   BP today is:  <130/80  Office blood pressures are  BP Readings from Last 3 Encounters:  10/15/19 120/60  09/28/19 138/80  02/26/19 (!) 146/84   Patient has failed these meds in the past: ramipril (low BP).    Patient checks BP at home several times per month  Patient home BP readings are  ranging: 126/70 mmHg   Patient is controlled on:  - Carvedilol 25mg . 1.5 tablets twice daily  - Quinapril 10mg . 1 tablet once daily   We discussed diet and exercise extensively  -  DASH diet:  following a diet emphasizing fruits and vegetables and low-fat dairy products along with whole grains, fish, poultry, and nuts. Reducing red meats and sugars.  -  Reducing the amount of salt intake to 1500mg /per day. -  Recommend using a salt substitute to replace your salt if you need flavor.  - We discussed modifying their lifestyle, including initial attempts to lose 5 to 10% of body weight. Engaging in at least 150 minutes per week of moderate-intensity exercise such as  brisk walking (15- to 20-minute mile) or something similar. Recommended they incorporate flexibility, balance, and some type of strength training exercises.  Please begin slowly and build up graduallyexercise: walks 1.5 miles and does house chores. Walks at least a mile.  (patient notes walking at least 1 mile everyday and being active through house chores).   Plan Continue current medications.     Hyperlipidemia   Lipid Panel     Component Value Date/Time   CHOL 212 (H) 10/15/2019 1058   TRIG 102.0 10/15/2019 1058   TRIG 126 08/25/2006 0850   HDL 41.70 10/15/2019 1058   CHOLHDL 5 10/15/2019 1058   VLDL 20.4 10/15/2019 1058   LDLCALC 150 (H) 10/15/2019 1058    The 10-year ASCVD risk score Mikey Bussing DC Jr., et al., 2013) is: 32.4%  Patient has failed these meds in past: simvastatin (does not remember why he stopped it in the past).  Patient is currently uncontrolled on the following medications:  - no medications  We discussed:  diet and exercise extensively  - diet: high in plant sterols (fruits/vegetables/nuts/whole grains/legumes) may reduce  cholesterol.  - Patient notes avoids fried food and aim to eat baked fish. Avoids junk food.   Plan Per results notes, plan to recheck lipid panel in 90 days. (due: April 2021)  Depending on results can consider moderate intensity statin.  Continue control with diet and exercise.   Aspirin therapy   Patient is currently managed on the following medications: - ASA 81mg , 1 tablet daily   The 10-year ASCVD risk score Mikey Bussing DC Jr., et al., 2013) is: 32.4%  Plan Continue current medications.   Hypothyroidism   TSH  Date Value Ref Range Status  10/15/2019 4.82 (H) 0.35 - 4.50 uIU/mL Final     No components found for: T4  Patient is currently uncontrolled on the following medications:  - levothyroxine 8mcg, 1 tablet once daily (recently started)   We discussed:  levothyroxine administration (in the morning on an empty stomach 30  minutes before breakfast).    Plan Patient started on levothyroxine in January.  Plan to recheck thyroid levels in 3 months from last results. (due: April 2021)  current medications.    Insomnia   Patient has failed these meds in past: none  Patient is currently controlled on the following medications: - melatonin 10mg , 1 at night   We discussed:  non-pharmacological interventions for insomnia. (Avoid napping, limit exposure to technology near bedtime, etc).  Plan Patient notes improvement in sleep.  Continue current medications.    Prostate health  Patient has failed these meds in past: none  Patient is currently managed on the following medications:  - Tamsulosin 0.4mg , 1 capsule daily   Plan Patient is managed by urologist. Patient had a recent elevated PSA.  Plans to do a biopsy in a couple of months.  Continue current medications.   Arthritis   Patient has failed these meds in past: none  Patient is currently controlled on the following medications:  - currently managed on no medications   - diclofenac 75mg , 1 tablet twice daily (has not taken for years)   We discussed:  caution use of diclofenac is setting of CHF. Recommended intial trial of Tylenol for future use.   Plan Continue as is. Recommended Tylenol for future use.   Glaucoma   Patient has failed these meds in past: none  Patient is currently controlled on the following medications:  - Brimonidine (Alphagan) 0.2% - Latanoprost (Xalatan) 0.005%  Plan Sees Heather [place], Optometrist Continue current medications  Medication Management   Patient organizes medications: in a pill box and organizes every Sunday. Denies issues obtaining medications.   Follow up Follow up appointment with PharmD in 6 months.   Patient needs lab appointment scheduled in April for repeat lipid panel and TSH (90 days after previous labs) for therapy reassessment.     Anson Crofts, PharmD Clinical Pharmacist [place]  [facility] [phone]

## 2019-12-07 NOTE — Patient Instructions (Addendum)
Visit Information  Goals Addressed            This Visit's Progress   . Pharmacy Care Plan       Current Barriers:  . Chronic Disease Management support, education, and care coordination needs related to CHF, HTN, and HLD, Insomnia, Hypothyroidism, Glaucoma, Prostate  Pharmacist Clinical Goal(s):  . High blood pressure:  o Maintain blood pressure within goal of your provider (130/80 or 140/90)  . Maintaining a low sodium diet with goal of less than 1521m per day . Continue working on lifestyle modifications (diet/exercise). . Insomnia: Continue to see improvement in insomnia. .Marland KitchenHeart failure: weigh yourself daily.  . High cholesterol:  o Cholesterol goals: Total Cholesterol goal under 200, Triglycerides goal under 150, HDL goal above 40 (men) or above 50 (women), LDL goal under 100.  .Marland KitchenHypothyroidism: Maintain Vitamin D-25 level at or above 30.  . Arthritis: Minimize pain level.   Interventions: . Comprehensive medication review performed. .Marland KitchenHeart failure:  o If you gain more than 3 pounds in one day or 5 pounds in one week call your doctor . High blood pressure:  o  DASH diet:  following a diet emphasizing fruits and vegetables and low-fat dairy products along with whole grains, fish, poultry, and nuts. Reducing red meats and sugars.  o Reducing the amount of salt intake to <15034mper day. o Recommend using a salt substitute to replace your salt if you need flavor.  o Continue: Carvedilol 2517m1.5 tablets twice daily, Quinapril 74m32m tablet once daily  . High cholesterol:  o We discussed: to improve reduce cholesterol levels: focus on a diet high in plant sterols (fruits/vegetables/nuts/whole grains/legumes) and increasing fiber to a daily intake of 10-25g/day.  o Continue: with diet modifications. Repeat blood work due April 2021.  . DiMarland Kitchencussed lifestyle modifications including exercise. Continue engaging in at least 150 minutes per week of moderate-intensity exercise such  as brisk walking (15- to 20-minute mile) or something similar. Recommended they incorporate flexibility, balance, and some type of strength training exercises.  Please begin slowly and build up gradually.  I discouraged prolonged sitting times.    . Hypothyroidism:  o Continue levothyroxine 75mc6m tablet once daily.  o Repeat lab work due April 2021.  . InsMarland Kitchenmnia:  o Discussed sleep hygiene for insomnia. (Avoid napping, limit exposure to technology near bedtime, etc). o Continue melatonin 74mg,66mablet daily.  . Prostate health:  o Continue tamsulosin 0.4mg, 123mpsule daily.  . Arthritis: consider taking Tylenol as needed for pain.  . Glaucoma:  o Continue eye drops (brimonidine (Alphagan) 0.2% and latanoprost (Xalatan) 0.005%).  . DiscuMarland Kitchensed importance of taking medications as directed.   Patient Self Care Activities:  . Calls provider office for new concerns or questions . Continue current medications as directed by providers.  . Continue following up with specialists. . Continue at home blood pressure readings. . Continue engaging in at least 150 minutes per week of moderate-intensity exercise such as brisk walking (15- to 20-minute mile) or something similar.  . Lab appointment needed in April for repeat lipid panel and TSH.   Initial goal documentation        Nicholas Wilkerson [place] information about Chronic Care Management services today including:  1. CCM service includes personalized support from designated clinical staff supervised by his physician, including individualized plan of care and coordination with other care providers 2. 24/7 contact phone numbers for assistance for urgent and routine care needs. 3. Service  will only be billed when office clinical staff spend 20 minutes or more in a month to coordinate care. 4. Only one practitioner may furnish and bill the service in a calendar month. 5. The patient may stop CCM services at any time (effective at the end of the  month) by phone call to the office staff. 6. The patient will be responsible for cost sharing (co-pay) of up to 20% of the service fee (after annual deductible is met).  Patient agreed to services and verbal consent obtained.   The patient verbalized understanding of instructions provided today and agreed to receive a mailed copy of patient instruction and/or educational materials. Telephone follow up appointment with pharmacy team member scheduled for: 06/05/2020  Nicholas Wilkerson, PharmD Clinical Pharmacist [facility] [place] [phone]    Cholesterol Content in Foods Cholesterol is a waxy, fat-like substance that helps to carry fat in the blood. The body needs cholesterol in small amounts, but too much cholesterol can cause damage to the arteries and heart. Most people should eat less than 200 milligrams (mg) of cholesterol a day. Foods with cholesterol  Cholesterol is found in animal-based foods, such as meat, seafood, and dairy. Generally, low-fat dairy and lean meats have less cholesterol than full-fat dairy and fatty meats. The milligrams of cholesterol per serving (mg per serving) of common cholesterol-containing foods are listed below. Meat and other proteins  Egg -- one large whole egg has 186 mg.  Veal shank -- 4 oz has 141 mg.  Lean ground [place] (93% lean) -- 4 oz has 118 mg.  Fat-trimmed lamb loin -- 4 oz has 106 mg.  Lean ground beef (90% lean) -- 4 oz has 100 mg.  Lobster -- 3.5 oz has 90 mg.  Pork loin chops -- 4 oz has 86 mg.  Canned salmon -- 3.5 oz has 83 mg.  Fat-trimmed beef top loin -- 4 oz has 78 mg.  Frankfurter -- 1 frank (3.5 oz) has 77 mg.  Crab -- 3.5 oz has 71 mg.  Roasted chicken without skin, white meat -- 4 oz has 66 mg.  Light bologna -- 2 oz has 45 mg.  Deli-cut [place] -- 2 oz has 31 mg.  Canned tuna -- 3.5 oz has 31 mg.  Nicholas Wilkerson -- 1 oz has 29 mg.  Oysters and mussels (raw) -- 3.5 oz has 25 mg.  Mackerel  -- 1 oz has 22 mg.  Trout -- 1 oz has 20 mg.  Pork sausage -- 1 link (1 oz) has 17 mg.  Salmon -- 1 oz has 16 mg.  Tilapia -- 1 oz has 14 mg. Dairy  Soft-serve ice cream --  cup (4 oz) has 103 mg.  Whole-milk yogurt -- 1 cup (8 oz) has 29 mg.  Cheddar cheese -- 1 oz has 28 mg.  American cheese -- 1 oz has 28 mg.  Whole milk -- 1 cup (8 oz) has 23 mg.  2% milk -- 1 cup (8 oz) has 18 mg.  Cream cheese -- 1 tablespoon (Tbsp) has 15 mg.  Cottage cheese --  cup (4 oz) has 14 mg.  Low-fat (1%) milk -- 1 cup (8 oz) has 10 mg.  Sour cream -- 1 Tbsp has 8.5 mg.  Low-fat yogurt -- 1 cup (8 oz) has 8 mg.  Nonfat Greek yogurt -- 1 cup (8 oz) has 7 mg.  Half-and-half cream -- 1 Tbsp has 5 mg. Fats and oils  Cod liver oil --  1 tablespoon (Tbsp) has 82 mg.  Butter -- 1 Tbsp has 15 mg.  Lard -- 1 Tbsp has 14 mg.  Bacon grease -- 1 Tbsp has 14 mg.  Mayonnaise -- 1 Tbsp has 5-10 mg.  Margarine -- 1 Tbsp has 3-10 mg. Exact amounts of cholesterol in these foods may vary depending on specific ingredients and brands. Foods without cholesterol Most plant-based foods do not have cholesterol unless you combine them with a food that has cholesterol. Foods without cholesterol include:  Grains and cereals.  Vegetables.  Fruits.  Vegetable oils, such as olive, canola, and sunflower oil.  Legumes, such as peas, beans, and lentils.  Nuts and seeds.  Egg whites. Summary  The body needs cholesterol in small amounts, but too much cholesterol can cause damage to the arteries and heart.  Most people should eat less than 200 milligrams (mg) of cholesterol a day. This information is not intended to replace advice given to you by your health care provider. Make sure you discuss any questions you have with your health care provider. Document Revised: 09/09/2017 Document Reviewed: 05/24/2017 Elsevier Patient Education  2020 [place] With Less [employer] with less  salt is one way to reduce the amount of sodium you get from food. Depending on your condition and overall health, your health care provider or diet and nutrition specialist (dietitian) may recommend that you reduce your sodium intake. Most people should have less than 2,300 milligrams (mg) of sodium each day. If you have high blood pressure (hypertension), you may need to limit your sodium to 1,500 mg each day. Follow the tips below to help reduce your sodium intake. What do I need to know about cooking with less salt? Shopping  Buy sodium-free or low-sodium products. Look for the following words on food labels: ? Low-sodium. ? Sodium-free. ? Reduced-sodium. ? No salt added. ? Unsalted.  Buy fresh or frozen vegetables. Avoid canned vegetables.  Avoid buying meats or protein foods that have been injected with broth or saline solution.  Avoid cured or smoked meats, such as hot dogs, bacon, salami, ham, and bologna. Reading food labels   Check the food label before buying or using packaged ingredients.  Look for products with no more than 140 mg of sodium in one serving.  Do not choose foods with salt as one of the first three ingredients on the ingredients list. If salt is one of the first three ingredients, it usually means the item is high in sodium, because ingredients are listed in order of amount in the food item. Cooking  Use herbs, seasonings without salt, and spices as substitutes for salt in foods.  Use sodium-free baking soda when baking.  Grill, braise, or roast foods to add flavor with less salt.  Avoid adding salt to pasta, rice, or hot cereals while cooking.  Drain and rinse canned vegetables before use.  Avoid adding salt when cooking sweets and desserts.  Cook with low-sodium ingredients. What are some salt alternatives? The following are herbs, seasonings, and spices that can be used instead of salt to give taste to your food. Herbs should be fresh or dried. Do  not choose packaged mixes. Next to the name of the herb, spice, or seasoning are some examples of foods you can pair it with. Herbs  Bay leaves - Soups, meat and vegetable dishes, and spaghetti sauce.  Basil - [employer], soups, pasta, and fish dishes.  Cilantro - Meat, poultry, and vegetable dishes.  Chili  powder - Marinades and Mexican dishes.  Chives - Salad dressings and potato dishes.  Cumin - Mexican dishes, couscous, and meat dishes.  Dill - Fish dishes, sauces, and salads.  Fennel - Meat and vegetable dishes, breads, and cookies.  Garlic (do not use garlic salt) - [place] dishes, meat dishes, salad dressings, and sauces.  Marjoram - Soups, potato dishes, and meat dishes.  Oregano - Pizza and spaghetti sauce.  Parsley - Salads, soups, pasta, and meat dishes.  Rosemary - [place] dishes, salad dressings, soups, and red meats.  Saffron - Fish dishes, pasta, and some poultry dishes.  Sage - Stuffings and sauces.  Tarragon - Fish and [employer].  Thyme - Stuffing, meat, and fish dishes. Seasonings  Lemon juice - Fish dishes, poultry dishes, vegetables, and salads.  Vinegar - Salad dressings, vegetables, and fish dishes. Spices  Cinnamon - Sweet dishes, such as cakes, cookies, and puddings.  Cloves - Gingerbread, puddings, and marinades for meats.  Curry - Vegetable dishes, fish and poultry dishes, and stir-fry dishes.  Ginger - Vegetables dishes, fish dishes, and stir-fry dishes.  Nutmeg - Pasta, vegetables, poultry, fish dishes, and custard. What are some low-sodium ingredients and foods?  Fresh or frozen fruits and vegetables with no sauce added.  Fresh or frozen whole meats, poultry, and fish with no sauce added.  Eggs.  Noodles, pasta, quinoa, rice.  Shredded or puffed wheat or puffed rice.  Regular or quick oats.  Milk, yogurt, hard cheeses, and low-sodium cheeses. Good cheese choices include Swiss, [place]. Always  check the label for the serving size and sodium content.  Unsalted butter or margarine.  Unsalted nuts.  Sherbet or ice cream (keep to  cup per serving).  Homemade pudding.  Sodium-free baking soda and baking powder. This is not a complete list of low-sodium ingredients and foods. Contact your dietitian for more options. Summary  Cooking with less salt is one way to reduce the amount of sodium that you get from food.  Buy sodium-free or low-sodium products.  Check the food label before using or buying packaged ingredients.  Use herbs, seasonings without salt, and spices as substitutes for salt in foods. This information is not intended to replace advice given to you by your health care provider. Make sure you discuss any questions you have with your health care provider. Document Revised: 09/09/2017 Document Reviewed: 10/05/2016 Elsevier Patient Education  2020 [employer].

## 2019-12-09 ENCOUNTER — Other Ambulatory Visit: Payer: Self-pay | Admitting: Family Medicine

## 2019-12-13 DIAGNOSIS — E039 Hypothyroidism, unspecified: Secondary | ICD-10-CM | POA: Insufficient documentation

## 2019-12-13 NOTE — Addendum Note (Signed)
Addended by: Alysia Penna A on: 12/13/2019 01:13 PM   Modules accepted: Orders

## 2019-12-13 NOTE — Progress Notes (Signed)
The orders have been placed  

## 2019-12-20 DIAGNOSIS — H61021 Chronic perichondritis of right external ear: Secondary | ICD-10-CM | POA: Diagnosis not present

## 2019-12-20 DIAGNOSIS — Z85828 Personal history of other malignant neoplasm of skin: Secondary | ICD-10-CM | POA: Diagnosis not present

## 2019-12-20 DIAGNOSIS — L570 Actinic keratosis: Secondary | ICD-10-CM | POA: Diagnosis not present

## 2020-01-01 DIAGNOSIS — H401131 Primary open-angle glaucoma, bilateral, mild stage: Secondary | ICD-10-CM | POA: Diagnosis not present

## 2020-01-06 ENCOUNTER — Other Ambulatory Visit: Payer: Self-pay | Admitting: Cardiology

## 2020-01-06 DIAGNOSIS — I10 Essential (primary) hypertension: Secondary | ICD-10-CM

## 2020-01-25 DIAGNOSIS — R972 Elevated prostate specific antigen [PSA]: Secondary | ICD-10-CM | POA: Diagnosis not present

## 2020-02-07 ENCOUNTER — Other Ambulatory Visit: Payer: Self-pay | Admitting: Urology

## 2020-02-07 DIAGNOSIS — R972 Elevated prostate specific antigen [PSA]: Secondary | ICD-10-CM

## 2020-02-24 ENCOUNTER — Other Ambulatory Visit: Payer: Self-pay | Admitting: Family Medicine

## 2020-02-28 ENCOUNTER — Encounter: Payer: Self-pay | Admitting: Hematology and Oncology

## 2020-02-28 ENCOUNTER — Inpatient Hospital Stay: Payer: PPO | Attending: Hematology and Oncology | Admitting: Hematology and Oncology

## 2020-02-28 ENCOUNTER — Other Ambulatory Visit: Payer: Self-pay

## 2020-02-28 DIAGNOSIS — R972 Elevated prostate specific antigen [PSA]: Secondary | ICD-10-CM | POA: Diagnosis not present

## 2020-02-28 DIAGNOSIS — C01 Malignant neoplasm of base of tongue: Secondary | ICD-10-CM

## 2020-02-28 NOTE — Assessment & Plan Note (Signed)
Clinically, he has no signs or symptoms of cancer recurrence He is compliant following up with ENT His primary care doctor is monitoring TSH He is a long-term cancer survivor We discussed discontinuation of follow-up and he is in agreement The patient is educated to watch out for signs and symptoms of cancer recurrence

## 2020-02-28 NOTE — Assessment & Plan Note (Signed)
According to the patient, he has elevated PSA and is currently undergoing further work-up with urologist I would defer to his urologist for further management

## 2020-02-28 NOTE — Progress Notes (Signed)
[facility] OFFICE PROGRESS NOTE  Patient Care Team: Laurey Morale, MD as PCP - General (Family Medicine) Heath Lark, MD as Consulting Physician (Hematology and Oncology) Melissa Montane, MD (Inactive) as Consulting Physician (Otolaryngology) Eppie Gibson, MD as Attending Physician (Radiation Oncology) Holley Bouche, NP (Inactive) as Nurse Practitioner (Nurse Practitioner) [name], [name], MD as Consulting Physician (Cardiology) [place], Nira Conn, [place] (Optometry) [place], [place], [facility] as Pharmacist (Pharmacist)  ASSESSMENT & PLAN:  Cancer of base of tongue Clinically, he has no signs or symptoms of cancer recurrence He is compliant following up with ENT His primary care doctor is monitoring TSH He is a long-term cancer survivor We discussed discontinuation of follow-up and he is in agreement The patient is educated to watch out for signs and symptoms of cancer recurrence  Elevated PSA According to the patient, he has elevated PSA and is currently undergoing further work-up with urologist I would defer to his urologist for further management   No orders of the defined types were placed in this encounter.   All questions were answered. The patient knows to call the clinic with any problems, questions or concerns. The total time spent in the appointment was 15 minutes encounter with patients including review of chart and various tests results, discussions about plan of care and coordination of care plan   Heath Lark, MD 02/28/2020 9:33 AM  INTERVAL HISTORY: Please see below for problem oriented charting. He returns for further follow-up He is doing well He has chronic dry mouth but slightly improved over time Denies recent dysphagia He has regular dental visits for poor dentition No recent tongue lesions Denies changes in his voice No new lymphadenopathy He is undergoing multiple testing with urologist for elevated PSA  SUMMARY OF ONCOLOGIC  HISTORY: Oncology History Overview Note  Cancer of base of tongue   Staging form: Lip and Oral Cavity, AJCC 7th Edition     Clinical stage from 12/03/2014: Stage IVA (T2, N2a, M0) - Signed by Heath Lark, MD on 12/13/2014 HPV positive by PCR testing.   Cancer of base of tongue ([place])  11/20/2014 Pathology Results   Accession: [id number] confirm squamous cell carcinoma.   11/20/2014 Procedure   The patient underwent fine-needle aspirate of the right lymph node   12/02/2014 Imaging   CT scan showed 2.2 cm the right tongue base mass with necrotic 4 cm right level II nodal metastasis with multiple bilateral small lymphadenopathy   12/10/2014 Imaging   PET CT scan showed tongue base mass with right level II lymph node metastasis   12/18/2014 Pathology Results   PCR results from [facility]: HPV 16 positive.  HPV 18 and other types negative.    12/24/2014 Procedure   He has placement of port and feeding tube   01/01/2015 - 02/12/2015 Chemotherapy   He received high dose cisplatin   01/01/2015 - 02/18/2015 Radiation Therapy   Rec'd concurrent RT to base of tongue and bilateral neck:  70 Gy in 35 fractions to gross disease, 63 Gy in 35 fractions to high risk nodal echelons, 56 Gy in 35 fractions to intermediate risk nodal echelons.   01/21/2015 Adverse Reaction    cycle 2 chemotherapy dose will be reduced by 25% due to intolerable side effects   02/11/2015 Adverse Reaction   Cycle 3 of chemotherapy dose will be reduced to 50% due to intolerable side effects   07/17/2015 Procedure   Both feeding tube and port were removed     REVIEW OF SYSTEMS:  Constitutional: Denies fevers, chills or abnormal weight loss Eyes: Denies blurriness of vision Ears, nose, mouth, throat, and face: Denies mucositis or sore throat Respiratory: Denies cough, dyspnea or wheezes Cardiovascular: Denies palpitation, chest discomfort or lower extremity swelling Gastrointestinal:  Denies nausea, heartburn or change in bowel  habits Skin: Denies abnormal skin rashes Lymphatics: Denies new lymphadenopathy or easy bruising Neurological:Denies numbness, tingling or new weaknesses Behavioral/Psych: Mood is stable, no new changes  All other systems were reviewed with the patient and are negative.  I have reviewed the past medical history, past surgical history, social history and family history with the patient and they are unchanged from previous note.  ALLERGIES:  has No Known Allergies.  MEDICATIONS:  Current Outpatient Medications  Medication Sig Dispense Refill  . aspirin EC 81 MG tablet Take 81 mg by mouth daily.    . brimonidine (ALPHAGAN) 0.2 % ophthalmic solution     . carvedilol (COREG) 25 MG tablet Take 25 mg 1&1/2 tablets twice a day 270 tablet 3  . Cholecalciferol (VITAMIN D3) 50 MCG (2000 UT) TABS Take 1 tablet by mouth daily.    . diclofenac (VOLTAREN) 75 MG EC tablet Take 1 tablet (75 mg total) by mouth 2 (two) times daily. (Patient not taking: Reported on 12/07/2019) 180 tablet 1  . GARLIC PO Take 1 tablet by mouth daily.    Marland Kitchen latanoprost (XALATAN) 0.005 % ophthalmic solution     . levothyroxine (SYNTHROID) 75 MCG tablet Take 1 tablet (75 mcg total) by mouth daily. 90 tablet 3  . Melatonin 10 MG TABS Take by mouth.    . Multiple Vitamin (MULTIVITAMIN) tablet Take 1 tablet by mouth daily.    . quinapril (ACCUPRIL) 10 MG tablet Take 1 tablet by mouth once daily 90 tablet 0  . sodium fluoride (FLUORISHIELD) 1.1 % GEL dental gel Instill one drop of gel per tooth space of fluoride tray. Place over teeth for 5 minutes. Remove. Spit out excess. Repeat nightly. 120 mL prn  . tadalafil (CIALIS) 20 MG tablet Take 1 tablet (20 mg total) by mouth daily as needed for erectile dysfunction. (Patient not taking: Reported on 12/07/2019) 30 tablet 3  . tamsulosin (FLOMAX) 0.4 MG CAPS capsule Take 1 capsule by mouth once daily 90 capsule 0   No current facility-administered medications for this visit.    PHYSICAL  EXAMINATION: ECOG PERFORMANCE STATUS: 0 - Asymptomatic  Vitals:   02/28/20 0916  BP: 137/87  Pulse: 69  Resp: 18  Temp: 97.9 F (36.6 C)  SpO2: 100%   Filed Weights   02/28/20 0916  Weight: 165 lb 9.6 oz (75.1 kg)    GENERAL:alert, no distress and comfortable SKIN: skin color, texture, turgor are normal, no rashes or significant lesions EYES: normal, Conjunctiva are pink and non-injected, sclera clear OROPHARYNX:no exudate, no erythema and lips, buccal mucosa, and tongue normal  NECK: supple, thyroid normal size, non-tender, without nodularity LYMPH:  no palpable lymphadenopathy in the cervical, axillary or inguinal LUNGS: clear to auscultation and percussion with normal breathing effort HEART: regular rate & rhythm and no murmurs and no lower extremity edema ABDOMEN:abdomen soft, non-tender and normal bowel sounds Musculoskeletal:no cyanosis of digits and no clubbing  NEURO: alert & oriented x 3 with fluent speech, no focal motor/sensory deficits  LABORATORY DATA:  I have reviewed the data as listed    Component Value Date/Time   NA 141 10/15/2019 1058   NA 141 12/12/2015 1028   K 4.3 10/15/2019 1058   K  4.7 12/12/2015 1028   CL 103 10/15/2019 1058   CO2 31 10/15/2019 1058   CO2 28 12/12/2015 1028   GLUCOSE 114 (H) 10/15/2019 1058   GLUCOSE 103 12/12/2015 1028   BUN 17 10/15/2019 1058   BUN 15.2 06/21/2016 0849   CREATININE 1.19 10/15/2019 1058   CREATININE 1.35 (H) 11/19/2016 1154   CREATININE 1.3 06/21/2016 0849   CALCIUM 9.5 10/15/2019 1058   CALCIUM 9.1 12/12/2015 1028   PROT 6.3 10/15/2019 1058   PROT 6.8 12/12/2015 1028   ALBUMIN 4.3 10/15/2019 1058   ALBUMIN 3.8 12/12/2015 1028   AST 16 10/15/2019 1058   AST 17 12/12/2015 1028   ALT 16 10/15/2019 1058   ALT 19 12/12/2015 1028   ALKPHOS 69 10/15/2019 1058   ALKPHOS 96 12/12/2015 1028   BILITOT 0.6 10/15/2019 1058   BILITOT 0.46 12/12/2015 1028   GFRNONAA 45 (L) 04/14/2015 1042   GFRAA 53 (L)  04/14/2015 1042    No results found for: SPEP, UPEP  Lab Results  Component Value Date   WBC 6.7 10/15/2019   NEUTROABS 5.1 10/15/2019   HGB 16.4 10/15/2019   HCT 50.0 10/15/2019   MCV 91.6 10/15/2019   PLT 184.0 10/15/2019      Chemistry      Component Value Date/Time   NA 141 10/15/2019 1058   NA 141 12/12/2015 1028   K 4.3 10/15/2019 1058   K 4.7 12/12/2015 1028   CL 103 10/15/2019 1058   CO2 31 10/15/2019 1058   CO2 28 12/12/2015 1028   BUN 17 10/15/2019 1058   BUN 15.2 06/21/2016 0849   CREATININE 1.19 10/15/2019 1058   CREATININE 1.35 (H) 11/19/2016 1154   CREATININE 1.3 06/21/2016 0849      Component Value Date/Time   CALCIUM 9.5 10/15/2019 1058   CALCIUM 9.1 12/12/2015 1028   ALKPHOS 69 10/15/2019 1058   ALKPHOS 96 12/12/2015 1028   AST 16 10/15/2019 1058   AST 17 12/12/2015 1028   ALT 16 10/15/2019 1058   ALT 19 12/12/2015 1028   BILITOT 0.6 10/15/2019 1058   BILITOT 0.46 12/12/2015 1028

## 2020-02-29 ENCOUNTER — Telehealth: Payer: Self-pay | Admitting: Family Medicine

## 2020-02-29 MED ORDER — DICLOFENAC SODIUM 75 MG PO TBEC: 75.0000 mg | DELAYED_RELEASE_TABLET | Freq: Two times a day (BID) | ORAL | 3 refills | Status: DC

## 2020-02-29 NOTE — Telephone Encounter (Signed)
Last filled 2019 Last OV 10/15/2019  Ok to fill?

## 2020-02-29 NOTE — Telephone Encounter (Signed)
Done

## 2020-02-29 NOTE — Telephone Encounter (Signed)
Left a detailed message on verified voice mail.   

## 2020-02-29 NOTE — Telephone Encounter (Signed)
Patient needs Diclofinac refilled.  He states [name] said if he ever needed it he would refill it for him.   Pharmacy: [facility]

## 2020-04-02 DIAGNOSIS — H401131 Primary open-angle glaucoma, bilateral, mild stage: Secondary | ICD-10-CM | POA: Diagnosis not present

## 2020-04-14 ENCOUNTER — Other Ambulatory Visit: Payer: Self-pay | Admitting: Cardiology

## 2020-04-14 DIAGNOSIS — I10 Essential (primary) hypertension: Secondary | ICD-10-CM

## 2020-04-18 DIAGNOSIS — R972 Elevated prostate specific antigen [PSA]: Secondary | ICD-10-CM | POA: Diagnosis not present

## 2020-04-21 ENCOUNTER — Ambulatory Visit
Admission: RE | Admit: 2020-04-21 | Discharge: 2020-04-21 | Disposition: A | Discharge status: Home / Self Care | Payer: PPO | Source: Ambulatory Visit | Attending: Urology | Admitting: Urology

## 2020-04-21 ENCOUNTER — Other Ambulatory Visit: Payer: Self-pay

## 2020-04-21 DIAGNOSIS — N4289 Other specified disorders of prostate: Secondary | ICD-10-CM | POA: Diagnosis not present

## 2020-04-21 DIAGNOSIS — R972 Elevated prostate specific antigen [PSA]: Secondary | ICD-10-CM

## 2020-04-21 MED ORDER — GADOBENATE DIMEGLUMINE 529 MG/ML IV SOLN
15.0000 mL | Freq: Once | INTRAVENOUS | Status: AC | PRN
  Administered 2020-04-21: 15 mL via INTRAVENOUS

## 2020-04-25 DIAGNOSIS — N401 Enlarged prostate with lower urinary tract symptoms: Secondary | ICD-10-CM | POA: Diagnosis not present

## 2020-04-25 DIAGNOSIS — R351 Nocturia: Secondary | ICD-10-CM | POA: Diagnosis not present

## 2020-04-25 DIAGNOSIS — R972 Elevated prostate specific antigen [PSA]: Secondary | ICD-10-CM | POA: Diagnosis not present

## 2020-05-15 ENCOUNTER — Telehealth: Payer: Self-pay | Admitting: Family Medicine

## 2020-05-15 NOTE — Telephone Encounter (Signed)
Left message for patient to schedule Annual Wellness Visit.  Please schedule with Nurse Health Advisor Shannon Crews, RN at [facility]  

## 2020-05-26 ENCOUNTER — Other Ambulatory Visit: Payer: Self-pay

## 2020-05-26 ENCOUNTER — Ambulatory Visit (INDEPENDENT_AMBULATORY_CARE_PROVIDER_SITE_OTHER): Payer: PPO

## 2020-05-26 DIAGNOSIS — Z0000 Encounter for general adult medical examination without abnormal findings: Secondary | ICD-10-CM | POA: Diagnosis not present

## 2020-05-26 NOTE — Patient Instructions (Signed)
Nicholas Wilkerson , Thank you for taking time to come for your Medicare Wellness Visit. I appreciate your ongoing commitment to your health goals. Please review the following plan we discussed and let me know if I can assist you in the future.   Screening recommendations/referrals: Colonoscopy: No longer required  Recommended yearly ophthalmology/optometry visit for glaucoma screening and checkup Recommended yearly dental visit for hygiene and checkup  Vaccinations: Influenza vaccine: Up to date, next due fall 2021 Pneumococcal vaccine: Completed series  Tdap vaccine:  Currently due, you may contact your insurance to discuss cost or await and injury to receive  Shingles vaccine: completed series  Advanced directives: Copies on file  Conditions/risks identified: None  Next appointment: 06/05/2020 @ 9:00 am via telephone with Pharmacist at [facility] 65 Years and Older, Male Preventive care refers to lifestyle choices and visits with your health care provider that can promote health and wellness. What does preventive care include?  A yearly physical exam. This is also called an annual well check.  Dental exams once or twice a year.  Routine eye exams. Ask your health care provider how often you should have your eyes checked.  Personal lifestyle choices, including:  Daily care of your teeth and gums.  Regular physical activity.  Eating a healthy diet.  Avoiding tobacco and drug use.  Limiting alcohol use.  Practicing safe sex.  Taking low doses of aspirin every day.  Taking vitamin and mineral supplements as recommended by your health care provider. What happens during an annual well check? The services and screenings done by your health care provider during your annual well check will depend on your age, overall health, lifestyle risk factors, and family history of disease. Counseling  Your health care provider may ask you questions about  your:  Alcohol use.  Tobacco use.  Drug use.  Emotional well-being.  Home and relationship well-being.  Sexual activity.  Eating habits.  History of falls.  Memory and ability to understand (cognition).  Work and work Statistician. Screening  You may have the following tests or measurements:  Height, weight, and BMI.  Blood pressure.  Lipid and cholesterol levels. These may be checked every 5 years, or more frequently if you are over 56 years old.  Skin check.  Lung cancer screening. You may have this screening every year starting at age 75 if you have a 30-pack-year history of smoking and currently smoke or have quit within the past 15 years.  Fecal occult blood test (FOBT) of the stool. You may have this test every year starting at age 49.  Flexible sigmoidoscopy or colonoscopy. You may have a sigmoidoscopy every 5 years or a colonoscopy every 10 years starting at age 93.  Prostate cancer screening. Recommendations will vary depending on your family history and other risks.  Hepatitis C blood test.  Hepatitis B blood test.  Sexually transmitted disease (STD) testing.  Diabetes screening. This is done by checking your blood sugar (glucose) after you have not eaten for a while (fasting). You may have this done every 1-3 years.  Abdominal aortic aneurysm (AAA) screening. You may need this if you are a current or former smoker.  Osteoporosis. You may be screened starting at age 59 if you are at high risk. Talk with your health care provider about your test results, treatment options, and if necessary, the need for more tests. Vaccines  Your health care provider may recommend certain vaccines, such as:  Influenza vaccine. This  is recommended every year.  Tetanus, diphtheria, and acellular pertussis (Tdap, Td) vaccine. You may need a Td booster every 10 years.  Zoster vaccine. You may need this after age 96.  Pneumococcal 13-valent conjugate (PCV13) vaccine.  One dose is recommended after age 47.  Pneumococcal polysaccharide (PPSV23) vaccine. One dose is recommended after age 4. Talk to your health care provider about which screenings and vaccines you need and how often you need them. This information is not intended to replace advice given to you by your health care provider. Make sure you discuss any questions you have with your health care provider. Document Released: 10/24/2015 Document Revised: 06/16/2016 Document Reviewed: 07/29/2015 Elsevier Interactive Patient Education  2017 [place] Prevention in the Home Falls can cause injuries. They can happen to people of all ages. There are many things you can do to make your home safe and to help prevent falls. What can I do on the outside of my home?  Regularly fix the edges of walkways and driveways and fix any cracks.  Remove anything that might make you trip as you walk through a door, such as a raised step or threshold.  Trim any bushes or trees on the path to your home.  Use bright outdoor lighting.  Clear any walking paths of anything that might make someone trip, such as rocks or tools.  Regularly check to see if handrails are loose or broken. Make sure that both sides of any steps have handrails.  Any raised decks and porches should have guardrails on the edges.  Have any leaves, snow, or ice cleared regularly.  Use sand or salt on walking paths during winter.  Clean up any spills in your garage right away. This includes oil or grease spills. What can I do in the bathroom?  Use night lights.  Install grab bars by the toilet and in the tub and shower. Do not use towel bars as grab bars.  Use non-skid mats or decals in the tub or shower.  If you need to sit down in the shower, use a plastic, non-slip stool.  Keep the floor dry. Clean up any water that spills on the floor as soon as it happens.  Remove soap buildup in the tub or shower regularly.  Attach bath  mats securely with double-sided non-slip rug tape.  Do not have throw rugs and other things on the floor that can make you trip. What can I do in the bedroom?  Use night lights.  Make sure that you have a light by your bed that is easy to reach.  Do not use any sheets or blankets that are too big for your bed. They should not hang down onto the floor.  Have a firm chair that has side arms. You can use this for support while you get dressed.  Do not have throw rugs and other things on the floor that can make you trip. What can I do in the kitchen?  Clean up any spills right away.  Avoid walking on wet floors.  Keep items that you use a lot in easy-to-reach places.  If you need to reach something above you, use a strong step stool that has a grab bar.  Keep electrical cords out of the way.  Do not use floor polish or wax that makes floors slippery. If you must use wax, use non-skid floor wax.  Do not have throw rugs and other things on the floor that can make you  trip. What can I do with my stairs?  Do not leave any items on the stairs.  Make sure that there are handrails on both sides of the stairs and use them. Fix handrails that are broken or loose. Make sure that handrails are as long as the stairways.  Check any carpeting to make sure that it is firmly attached to the stairs. Fix any carpet that is loose or worn.  Avoid having throw rugs at the top or bottom of the stairs. If you do have throw rugs, attach them to the floor with carpet tape.  Make sure that you have a light switch at the top of the stairs and the bottom of the stairs. If you do not have them, ask someone to add them for you. What else can I do to help prevent falls?  Wear shoes that:  Do not have high heels.  Have rubber bottoms.  Are comfortable and fit you well.  Are closed at the toe. Do not wear sandals.  If you use a stepladder:  Make sure that it is fully opened. Do not climb a closed  stepladder.  Make sure that both sides of the stepladder are locked into place.  Ask someone to hold it for you, if possible.  Clearly mark and make sure that you can see:  Any grab bars or handrails.  First and last steps.  Where the edge of each step is.  Use tools that help you move around (mobility aids) if they are needed. These include:  Canes.  Walkers.  Scooters.  Crutches.  Turn on the lights when you go into a dark area. Replace any light bulbs as soon as they burn out.  Set up your furniture so you have a clear path. Avoid moving your furniture around.  If any of your floors are uneven, fix them.  If there are any pets around you, be aware of where they are.  Review your medicines with your doctor. Some medicines can make you feel dizzy. This can increase your chance of falling. Ask your doctor what other things that you can do to help prevent falls. This information is not intended to replace advice given to you by your health care provider. Make sure you discuss any questions you have with your health care provider. Document Released: 07/24/2009 Document Revised: 03/04/2016 Document Reviewed: 11/01/2014 Elsevier Interactive Patient Education  2017 [employer].

## 2020-05-26 NOTE — Progress Notes (Signed)
Subjective:   Nicholas Wilkerson is a 79 y.o. male who presents for Medicare Annual/Subsequent preventive examination.  I connected with Irven Shelling  today by telephone and verified that I am speaking with the correct person using two identifiers. Location patient: home Location provider: work Persons participating in the virtual visit: patient, provider.   I discussed the limitations, risks, security and privacy concerns of performing an evaluation and management service by telephone and the availability of in person appointments. I also discussed with the patient that there may be a patient responsible charge related to this service. The patient expressed understanding and verbally consented to this telephonic visit.    Interactive audio and video telecommunications were attempted between this provider and patient, however failed, due to patient having technical difficulties OR patient did not have access to video capability.  We continued and completed visit with audio only.      Review of Systems    N/A Cardiac Risk Factors include: advanced age (>6men, >32 women);dyslipidemia;male gender     Objective:    There were no vitals filed for this visit. There is no height or weight on file to calculate BMI.  Advanced Directives 05/26/2020 10/21/2017 02/25/2017 12/19/2015 08/18/2015 07/17/2015 07/14/2015  Does Patient Have a Medical Advance Directive? Yes Yes Yes No Yes - Yes  Type of Paramedic of [place];Living will - [place];Living will [place];Living will [place];Living will - [place];Living will  Does patient want to make changes to medical advance directive? No - Patient declined - - - No - Patient declined - No - Patient declined  Copy of [place] in Chart? Yes - validated most recent copy scanned in chart (See row information) - Yes Yes No - copy requested  No - copy requested No - copy requested  Would patient like information on creating a medical advance directive? - - - - - - -    Current Medications (verified) Outpatient Encounter Medications as of 05/26/2020  Medication Sig  . aspirin EC 81 MG tablet Take 81 mg by mouth daily.  . brimonidine (ALPHAGAN) 0.2 % ophthalmic solution   . carvedilol (COREG) 25 MG tablet Take 25 mg 1&1/2 tablets twice a day  . Cholecalciferol (VITAMIN D3) 50 MCG (2000 UT) TABS Take 1 tablet by mouth daily.  . diclofenac (VOLTAREN) 75 MG EC tablet Take 1 tablet (75 mg total) by mouth 2 (two) times daily.  Marland Kitchen latanoprost (XALATAN) 0.005 % ophthalmic solution   . levothyroxine (SYNTHROID) 75 MCG tablet Take 1 tablet (75 mcg total) by mouth daily.  . Melatonin 10 MG TABS Take by mouth.  . Multiple Vitamin (MULTIVITAMIN) tablet Take 1 tablet by mouth daily.  . quinapril (ACCUPRIL) 10 MG tablet Take 1 tablet by mouth once daily  . sodium fluoride (FLUORISHIELD) 1.1 % GEL dental gel Instill one drop of gel per tooth space of fluoride tray. Place over teeth for 5 minutes. Remove. Spit out excess. Repeat nightly.  . tamsulosin (FLOMAX) 0.4 MG CAPS capsule Take 1 capsule by mouth once daily  . GARLIC PO Take 1 tablet by mouth daily. (Patient not taking: Reported on 05/26/2020)  . tadalafil (CIALIS) 20 MG tablet Take 1 tablet (20 mg total) by mouth daily as needed for erectile dysfunction. (Patient not taking: Reported on 12/07/2019)   No facility-administered encounter medications on file as of 05/26/2020.    Allergies (verified) Patient has no known  allergies.   History: Past Medical History:  Diagnosis Date  . ARTHRITIS 10/22/2008  . BPH (benign prostatic hyperplasia)   . CHF (congestive heart failure) [facility])    sees Dr. Peter [name]   . Dilated cardiomyopathy ([place])   . DIVERTICULOSIS, COLON 12/08/2007  . Glaucoma    sees Dr. Heather [place]   . H/O asbestos exposure   . History of echocardiogram 05/22/2007   EF was  45-50% / Mild concentric LV hypertrophy with mild global hypokinesis and overall mild systolic dysfunction .  Mild AV sclerosis / Mild Mitral insufficiency / compared to prior study 04/24/02 -- LV function has improved further.    . History of kidney stones   . Hypercholesterolemia   . Hypertension   . Insomnia 06/11/2015  . Radiation 01/01/15-02/18/15   base of tongue and bilateral neck 70 Gy  . Skin cancer    squamous cell, basal cell  . Squamous cell carcinoma 11/20/14   base of tongue primary  . Throat cancer ([place]) 10/2014   had chemo   Past Surgical History:  Procedure Laterality Date  . CARDIAC CATHETERIZATION  10/17/2001   EF estimated at 30% / moderate LV enlargement  / 1. Minimal nonobstructive atherosclerotic coronary artery disease / 2. Severe LV dysfunction with global hypokinesia consistent with dilated nonischemic cardiomyopath / 3. Moderate pulmonary hypertension  . COLONOSCOPY  06/23/2017   per Dr. Carlean Purl, clear, no repeats needed   . LYMPH NODE BIOPSY     Family History  Problem Relation Age of Onset  . Stroke Father   . Cancer Father        kidney ca  . Angina Mother   . Colon cancer Neg Hx   . Esophageal cancer Neg Hx   . Rectal cancer Neg Hx   . Stomach cancer Neg Hx    Social History   Socioeconomic History  . Marital status: Married    Spouse name: Not on file  . Number of children: 4  . Years of education: Not on file  . Highest education level: Not on file  Occupational History  . Occupation: Industrial/product designer: RETIRED    Comment: retired  Tobacco Use  . Smoking status: Never Smoker  . Smokeless tobacco: Never Used  Vaping Use  . Vaping Use: Never used  Substance and Sexual Activity  . Alcohol use: Yes    Alcohol/week: 0.0 standard drinks    Comment: occasional beer, a couple times a month  . Drug use: No  . Sexual activity: Not on file    Comment: retired Pension scheme manager, married  Other Topics Concern  . Not on file  Social  History Narrative   Married, retired Pension scheme manager 4 daughters, one of whom is a Marine scientist. Several grandchildren who he loves to play with.   1 coffee daily   02/21/2017   Social Determinants of Health   Financial Resource Strain: Low Risk   . Difficulty of Paying Living Expenses: Not hard at all  Food Insecurity: No Food Insecurity  . Worried About Charity fundraiser in the Last Year: Never true  . Ran Out of Food in the Last Year: Never true  Transportation Needs: No Transportation Needs  . Lack of Transportation (Medical): No  . Lack of Transportation (Non-Medical): No  Physical Activity: Inactive  . Days of Exercise per Week: 0 days  . Minutes of Exercise per Session: 0 min  Stress: No Stress Concern Present  . Feeling of  Stress : Not at all  Social Connections: Moderately Integrated  . Frequency of Communication with Friends and Family: More than three times a week  . Frequency of Social Gatherings with Friends and Family: More than three times a week  . Attends Religious Services: More than 4 times per year  . Active Member of Clubs or Organizations: No  . Attends Archivist Meetings: Never  . Marital Status: Married    Tobacco Counseling Counseling given: Not Answered   Clinical Intake:  Pre-visit preparation completed: Yes  Pain : No/denies pain     Nutritional Risks: None Diabetes: No  How often do you need to have someone help you when you read instructions, pamphlets, or other written materials from your doctor or pharmacy?: 1 - Never What is the last grade level you completed in school?: Some College  Diabetic?No  Interpreter Needed?: No  Information entered by :: [place] of Daily Living In your present state of health, do you have any difficulty performing the following activities: 05/26/2020  Hearing? N  Vision? N  Difficulty concentrating or making decisions? N  Walking or climbing stairs? N  Dressing or bathing? N    Doing errands, shopping? N  Preparing Food and eating ? N  Using the Toilet? N  In the past six months, have you accidently leaked urine? N  Do you have problems with loss of bowel control? N  Managing your Medications? N  Managing your Finances? N  Housekeeping or managing your Housekeeping? N  Some recent data might be hidden    Patient Care Team: Laurey Morale, MD as PCP - General (Family Medicine) Heath Lark, MD as Consulting Physician (Hematology and Oncology) Melissa Montane, MD as Consulting Physician (Otolaryngology) Eppie Gibson, MD as Attending Physician (Radiation Oncology) Holley Bouche, NP (Inactive) as Nurse Practitioner (Nurse Practitioner) [name], [name], MD as Consulting Physician (Cardiology) [place], Heather, [place] (Optometry) Earnie Larsson, [facility] as Pharmacist (Pharmacist)  Indicate any recent Medical Services you may have received from other than [facility] providers in the past year (date may be approximate).     Assessment:   This is a routine wellness examination for Frederich.  Hearing/Vision screen  Hearing Screening   125Hz  250Hz  500Hz  1000Hz  2000Hz  3000Hz  4000Hz  6000Hz  8000Hz   Right ear:           Left ear:           Vision Screening Comments: Patient states gets eyes checked every 3-4 months   Dietary issues and exercise activities discussed: Current Exercise Habits: The patient does not participate in regular exercise at present, Exercise limited by: cardiac condition(s)  Goals    . Exercise 150 min/wk Moderate Activity     Do more exercise;  Keep walking weather permitted      . Patient Stated     Continue staying active walking, playing with grandkids.    . Patient Stated     I want to get my health back to normal and get energy levels back up     . Pharmacy Care Plan     Current Barriers:  . Chronic Disease Management support, education, and care coordination needs related to CHF, HTN, and HLD, Insomnia, Hypothyroidism, Glaucoma,  Prostate  Pharmacist Clinical Goal(s):  . High blood pressure:  o Maintain blood pressure within goal of your provider (130/80 or 140/90)  . Maintaining a low sodium diet with goal of less than 1500mg  per day . Continue working on lifestyle  modifications (diet/exercise). . Insomnia: Continue to see improvement in insomnia. Marland Kitchen Heart failure: weigh yourself daily.  . High cholesterol:  o Cholesterol goals: Total Cholesterol goal under 200, Triglycerides goal under 150, HDL goal above 40 (men) or above 50 (women), LDL goal under 100.  Marland Kitchen Hypothyroidism: Maintain Vitamin D-25 level at or above 30.  . Arthritis: Minimize pain level.   Interventions: . Comprehensive medication review performed. Marland Kitchen Heart failure:  o If you gain more than 3 pounds in one day or 5 pounds in one week call your doctor . High blood pressure:  o  DASH diet:  following a diet emphasizing fruits and vegetables and low-fat dairy products along with whole grains, fish, poultry, and nuts. Reducing red meats and sugars.  o Reducing the amount of salt intake to 1500mg /per day. o Recommend using a salt substitute to replace your salt if you need flavor.  o Continue: Carvedilol 25mg . 1.5 tablets twice daily, Quinapril 10mg . 1 tablet once daily  . High cholesterol:  o We discussed: to improve reduce cholesterol levels: focus on a diet high in plant sterols (fruits/vegetables/nuts/whole grains/legumes) and increasing fiber to a daily intake of 10-25g/day.  o Continue: with diet modifications. Repeat blood work due April 2021.  Marland Kitchen Discussed lifestyle modifications including exercise. Continue engaging in at least 150 minutes per week of moderate-intensity exercise such as brisk walking (15- to 20-minute mile) or something similar. Recommended they incorporate flexibility, balance, and some type of strength training exercises.  Please begin slowly and build up gradually.  I discouraged prolonged sitting times.    . Hypothyroidism:   o Continue levothyroxine 72mcg, 1 tablet once daily.  o Repeat lab work due April 2021.  Marland Kitchen Insomnia:  o Discussed sleep hygiene for insomnia. (Avoid napping, limit exposure to technology near bedtime, etc). o Continue melatonin 10mg , 1 tablet daily.  . Prostate health:  o Continue tamsulosin 0.4mg , 1 capsule daily.  . Arthritis: consider taking Tylenol as needed for pain.  . Glaucoma:  o Continue eye drops (brimonidine (Alphagan) 0.2% and latanoprost (Xalatan) 0.005%).  Marland Kitchen Discussed importance of taking medications as directed.   Patient Self Care Activities:  . Calls provider office for new concerns or questions . Continue current medications as directed by providers.  . Continue following up with specialists. . Continue at home blood pressure readings. . Continue engaging in at least 150 minutes per week of moderate-intensity exercise such as brisk walking (15- to 20-minute mile) or something similar.  . Lab appointment needed in April for repeat lipid panel and TSH.   Initial goal documentation       Depression Screen PHQ 2/9 Scores 05/26/2020 10/24/2018 09/29/2018 10/21/2017 04/04/2017 02/25/2017 12/19/2015  PHQ - 2 Score 0 0 0 0 0 0 0  PHQ- 9 Score 0 0 - - - - -    Fall Risk Fall Risk  05/26/2020 10/24/2018 09/29/2018 10/21/2017 02/25/2017  Falls in the past year? 0 1 0 No No  Comment - fell off ladder while cleaning gutters; seen in ED, only broke a finger - - -  Number falls in past yr: 0 0 - - -  Injury with Fall? 0 1 - - -  Risk for fall due to : History of fall(s);Medication side effect History of fall(s) - - -  Follow up Falls evaluation completed;Falls prevention discussed Education provided - - -    Any stairs in or around the home? Yes  If so, are there any without handrails? No  Home free of loose throw rugs in walkways, pet beds, electrical cords, etc? Yes  Adequate lighting in your home to reduce risk of falls? Yes   ASSISTIVE DEVICES UTILIZED TO PREVENT  FALLS:  Life alert? No  Use of a cane, walker or w/c? No  Grab bars in the bathroom? No  Shower chair or bench in shower? Yes  Elevated toilet seat or a handicapped toilet? No     Cognitive Function: Cognition within normal limits based on direct observation MMSE - Mini Mental State Exam 10/21/2017  Not completed: (No Data)        Immunizations Immunization History  Administered Date(s) Administered  . Fluad Quad(high Dose 65+) 10/15/2019  . Influenza Split 08/11/2011, 08/31/2012  . Influenza Whole 08/18/2007, 07/11/2008, 08/12/2009, 06/25/2010  . Influenza, High Dose Seasonal PF 07/31/2013, 07/01/2016, 07/18/2017, 08/09/2018  . Influenza,inj,Quad PF,6+ Mos 07/31/2014, 06/13/2015  . Pneumococcal Conjugate-13 09/23/2016  . Pneumococcal Polysaccharide-23 06/19/2009  . Td 06/27/2008  . Zoster 07/11/2008  . Zoster Recombinat (Shingrix) 02/21/2018    TDAP status: Due, Education has been provided regarding the importance of this vaccine. Advised may receive this vaccine at local pharmacy or Health Dept. Aware to provide a copy of the vaccination record if obtained from local pharmacy or Health Dept. Verbalized acceptance and understanding. Flu Vaccine status: Up to date Pneumococcal vaccine status: Up to date Covid-19 vaccine status: Completed vaccines  Qualifies for Shingles Vaccine? Yes   Zostavax completed Yes   Shingrix Completed?: Yes  Screening Tests Health Maintenance  Topic Date Due  . Hepatitis C Screening  Never done  . COVID-19 Vaccine (1) Never done  . TETANUS/TDAP  06/27/2018  . INFLUENZA VACCINE  05/11/2020  . PNA vac Low Risk Adult  Completed    Health Maintenance  Health Maintenance Due  Topic Date Due  . Hepatitis C Screening  Never done  . COVID-19 Vaccine (1) Never done  . TETANUS/TDAP  06/27/2018  . INFLUENZA VACCINE  05/11/2020    Colorectal cancer screening: No longer required.   Lung Cancer Screening: (Low Dose CT Chest recommended if Age  57-80 years, 30 pack-year currently smoking OR have quit w/in 15years.) does not qualify.   Lung Cancer Screening Referral: N/A  Additional Screening:  Hepatitis C Screening: does not qualify;   Vision Screening: Recommended annual ophthalmology exams for early detection of glaucoma and other disorders of the eye. Is the patient up to date with their annual eye exam?  Yes  Who is the provider or what is the name of the office in which the patient attends annual eye exams? Dr. [place] If pt is not established with a provider, would they like to be referred to a provider to establish care? No .   Dental Screening: Recommended annual dental exams for proper oral hygiene  Community Resource Referral / Chronic Care Management: CRR required this visit?  No   CCM required this visit?  No      Plan:     I have personally reviewed and noted the following in the patient's chart:   . Medical and social history . Use of alcohol, tobacco or illicit drugs  . Current medications and supplements . Functional ability and status . Nutritional status . Physical activity . Advanced directives . List of other physicians . Hospitalizations, surgeries, and ER visits in previous 12 months . Vitals . Screenings to include cognitive, depression, and falls . Referrals and appointments  In addition, I have reviewed and discussed with patient certain  preventive protocols, quality metrics, and best practice recommendations. A written personalized care plan for preventive services as well as general preventive health recommendations were provided to patient.     Ofilia Neas, LPN   05/23/8870   Nurse Notes: Patient has concerns of lower energy levels since getting his Covid vaccine in April. States that he feels he can not do as much as he used to do. Also has issues with nose stopping up at bedtime. Has tried nasal sprays with no relief

## 2020-05-27 ENCOUNTER — Encounter: Payer: Self-pay | Admitting: Family Medicine

## 2020-05-27 ENCOUNTER — Ambulatory Visit (INDEPENDENT_AMBULATORY_CARE_PROVIDER_SITE_OTHER): Payer: PPO | Admitting: Family Medicine

## 2020-05-27 ENCOUNTER — Other Ambulatory Visit: Payer: Self-pay

## 2020-05-27 VITALS — BP 110/62 | HR 81 | Temp 98.5°F | Wt 159.4 lb

## 2020-05-27 DIAGNOSIS — J019 Acute sinusitis, unspecified: Secondary | ICD-10-CM | POA: Diagnosis not present

## 2020-05-27 DIAGNOSIS — E039 Hypothyroidism, unspecified: Secondary | ICD-10-CM

## 2020-05-27 DIAGNOSIS — R739 Hyperglycemia, unspecified: Secondary | ICD-10-CM | POA: Diagnosis not present

## 2020-05-27 MED ORDER — AZITHROMYCIN 250 MG PO TABS
ORAL_TABLET | ORAL | 0 refills | Status: DC
Start: 2020-05-27 — End: 2020-07-15

## 2020-05-27 NOTE — Progress Notes (Signed)
° °  Subjective:    Patient ID: SARATH PRIVOTT, male    DOB: 27-Dec-1940, 79 y.o.   MRN: [id number]  HPI Here to discuss some generalized fatigue he has felt for several months. He also thinks he has a sinus infection. For several weeks he has had sinus congestion, PND, and a mild headache. No fever or ST or cough. Using Mucinex. Of note we started him on Synthroid after his last well exam, and he is due to recheck levels.    Review of Systems  Constitutional: Positive for fatigue.  HENT: Positive for congestion, postnasal drip and sinus pressure.   Eyes: Negative.   Respiratory: Negative.   Cardiovascular: Negative.        Objective:   Physical Exam Constitutional:      Appearance: Normal appearance.  HENT:     Right Ear: Tympanic membrane, ear canal and external ear normal.     Left Ear: Tympanic membrane, ear canal and external ear normal.     Nose: Nose normal.     Mouth/Throat:     Pharynx: Oropharynx is clear.  Eyes:     Conjunctiva/sclera: Conjunctivae normal.  Cardiovascular:     Rate and Rhythm: Normal rate and regular rhythm.     Pulses: Normal pulses.     Heart sounds: Normal heart sounds.  Pulmonary:     Effort: Pulmonary effort is normal.     Breath sounds: Normal breath sounds.  Lymphadenopathy:     Cervical: No cervical adenopathy.  Neurological:     Mental Status: He is alert.           Assessment & Plan:  For the sinusitis, treat with a Zpack. For the hypothyroidism, we will check another panel today. For the fatigue, I suspect his Synthroid dose may needto be increased but we will wait for the test results. We will check an A1c as well. ' Alysia Penna, MD

## 2020-05-28 LAB — T4, FREE: Free T4: 1.4 ng/dL (ref 0.8–1.8)

## 2020-05-28 LAB — HEMOGLOBIN A1C
Hgb A1c MFr Bld: 5.5 % of total Hgb (ref <5.7)
Mean Plasma Glucose: 111 (calc)
eAG (mmol/L): 6.2 (calc)

## 2020-05-28 LAB — TSH: TSH: 1.45 mIU/L (ref 0.40–4.50)

## 2020-05-28 LAB — T3, FREE: T3, Free: 2.4 pg/mL (ref 2.3–4.2)

## 2020-05-29 ENCOUNTER — Telehealth: Payer: Self-pay | Admitting: Family Medicine

## 2020-05-29 MED ORDER — FLUTICASONE PROPIONATE 50 MCG/ACT NA SUSP
2.0000 | Freq: Every day | NASAL | 11 refills | Status: DC
Start: 2020-05-29 — End: 2021-11-23

## 2020-05-29 NOTE — Telephone Encounter (Signed)
Please advise. rx is not on the med list

## 2020-05-29 NOTE — Telephone Encounter (Signed)
Pt wants medication flonaze sent to   [facility] ([address] [place]  [address] ([place]) [place] [address]  Phone:  [phone] Fax:  [phone]   Please advise

## 2020-05-29 NOTE — Telephone Encounter (Signed)
Done

## 2020-06-05 ENCOUNTER — Telehealth: Payer: PPO

## 2020-06-09 DIAGNOSIS — R972 Elevated prostate specific antigen [PSA]: Secondary | ICD-10-CM | POA: Diagnosis not present

## 2020-06-09 DIAGNOSIS — D075 Carcinoma in situ of prostate: Secondary | ICD-10-CM | POA: Diagnosis not present

## 2020-06-22 NOTE — Progress Notes (Signed)
Nicholas Wilkerson Date of Birth: 13-Jan-1941   History of Present Illness: Nicholas Wilkerson is seen for followup. He has a history of dilated cardiomyopathy with initial ejection fraction of 30%. Subsequent evaluation has demonstrated improvement to 50-55%. Last Echo was in January 2018 showing decrease in EF to 35-40%. ACEi was resumed at that time. . 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 He was treated with RT and chemo.   Last November he complained of some palpitations. Event monitor short short bursts of SVT and NSVT. No bradycardia or pauses. We increased his Coreg dose at that time.  On follow up today he notes he had extensive Urologic evaluation for an elevated PSA with no cancer found. He does complain of getting tired real easy with activity. Thinks the higher dose of Coreg has slowed him down. No edema. No chest pain. No significant dyspnea.  Current Outpatient Medications on File Prior to Visit  Medication Sig Dispense Refill   aspirin EC 81 MG tablet Take 81 mg by mouth daily.     azithromycin (ZITHROMAX Z-PAK) 250 MG tablet As directed 6 each 0   brimonidine (ALPHAGAN) 0.2 % ophthalmic solution      carvedilol (COREG) 25 MG tablet Take 25 mg 1&1/2 tablets twice a day 270 tablet 3   Cholecalciferol (VITAMIN D3) 50 MCG (2000 UT) TABS Take 1 tablet by mouth daily.     diclofenac (VOLTAREN) 75 MG EC tablet Take 1 tablet (75 mg total) by mouth 2 (two) times daily. 180 tablet 3   fluticasone (FLONASE) 50 MCG/ACT nasal spray Place 2 sprays into both nostrils daily. 16 g 11   GARLIC PO Take 1 tablet by mouth daily.      latanoprost (XALATAN) 0.005 % ophthalmic solution      levothyroxine (SYNTHROID) 75 MCG tablet Take 1 tablet (75 mcg total) by mouth daily. 90 tablet 3   Melatonin 10 MG TABS Take by mouth.     Multiple Vitamin (MULTIVITAMIN) tablet Take 1 tablet by mouth daily.     quinapril (ACCUPRIL) 10 MG tablet Take 1 tablet by mouth once daily 90  tablet 0   sodium fluoride (FLUORISHIELD) 1.1 % GEL dental gel Instill one drop of gel per tooth space of fluoride tray. Place over teeth for 5 minutes. Remove. Spit out excess. Repeat nightly. 120 mL prn   tadalafil (CIALIS) 20 MG tablet Take 1 tablet (20 mg total) by mouth daily as needed for erectile dysfunction. 30 tablet 3   tamsulosin (FLOMAX) 0.4 MG CAPS capsule Take 1 capsule by mouth once daily 90 capsule 0   No current facility-administered medications on file prior to visit.    No Known Allergies  Past Medical History:  Diagnosis Date   ARTHRITIS 10/22/2008   BPH (benign prostatic hyperplasia)    CHF (congestive heart failure) ([place])    sees Dr. Ranen Doolin [name]    Dilated cardiomyopathy [facility])    DIVERTICULOSIS, COLON 12/08/2007   Glaucoma    sees Dr. Heather [place]    H/O asbestos exposure    History of echocardiogram 05/22/2007   EF was 45-50% / Mild concentric LV hypertrophy with mild global hypokinesis and overall mild systolic dysfunction .  Mild AV sclerosis / Mild Mitral insufficiency / compared to prior study 04/24/02 -- LV function has improved further.     History of kidney stones    Hypercholesterolemia    Hypertension    Insomnia 06/11/2015   Radiation 01/01/15-02/18/15  base of tongue and bilateral neck 70 Gy   Skin cancer    squamous cell, basal cell   Squamous cell carcinoma 11/20/14   base of tongue primary   Throat cancer ([place]) 10/2014   had chemo    Past Surgical History:  Procedure Laterality Date   CARDIAC CATHETERIZATION  10/17/2001   EF estimated at 30% / moderate LV enlargement  / 1. Minimal nonobstructive atherosclerotic coronary artery disease / 2. Severe LV dysfunction with global hypokinesia consistent with dilated nonischemic cardiomyopath / 3. Moderate pulmonary hypertension   COLONOSCOPY  06/23/2017   per Dr. Carlean Purl, clear, no repeats needed    LYMPH NODE BIOPSY      Social History   Tobacco Use  Smoking Status Never  Smoker  Smokeless Tobacco Never Used    Social History   Substance and Sexual Activity  Alcohol Use Yes   Alcohol/week: 0.0 standard drinks   Comment: occasional beer, a couple times a month    Family History  Problem Relation Age of Onset   Stroke Father    Cancer Father        kidney ca   Angina Mother    Colon cancer Neg Hx    Esophageal cancer Neg Hx    Rectal cancer Neg Hx    Stomach cancer Neg Hx     Review of Systems:  All other systems were reviewed and are negative.  Physical Exam: BP (!) 114/50    Pulse 75    Ht 5\' 8"  (1.727 m)    Wt 161 lb 6.4 oz (73.2 kg)    SpO2 97%    BMI 24.54 kg/m  GENERAL:  Well appearing WM in NAD HEENT:  PERRL, EOMI, sclera are clear. Oropharynx is clear. NECK:  No jugular venous distention, carotid upstroke brisk and symmetric, no bruits, no thyromegaly or adenopathy LUNGS:  Clear to auscultation bilaterally CHEST:  Unremarkable HEART:  RRR with extrasystoles,  PMI not displaced or sustained,S1 and S2 within normal limits, no S3, no S4: no clicks, no rubs, no murmurs ABD:  Soft, nontender. BS +, no masses or bruits. No hepatomegaly, no splenomegaly EXT:  2 + pulses throughout, no edema, no cyanosis no clubbing SKIN:  Warm and dry.  No rashes NEURO:  Alert and oriented x 3. Cranial nerves II through XII intact. PSYCH:  Cognitively intact      LABORATORY DATA:  Lab Results  Component Value Date   WBC 6.7 10/15/2019   HGB 16.4 10/15/2019   HCT 50.0 10/15/2019   PLT 184.0 10/15/2019   GLUCOSE 114 (H) 10/15/2019   CHOL 212 (H) 10/15/2019   TRIG 102.0 10/15/2019   HDL 41.70 10/15/2019   LDLCALC 150 (H) 10/15/2019   ALT 16 10/15/2019   AST 16 10/15/2019   NA 141 10/15/2019   K 4.3 10/15/2019   CL 103 10/15/2019   CREATININE 1.19 10/15/2019   BUN 17 10/15/2019   CO2 31 10/15/2019   TSH 1.45 05/27/2020   PSA 7.93 (H) 10/15/2019   INR 0.95 07/17/2015   HGBA1C 5.5 05/27/2020    Ecg today shows NSR rate 75.  Nonspecific TWA.    I have personally reviewed and interpreted this study.  Echo 11/02/16: Study Conclusions  - Left ventricle: The cavity size was mildly dilated. Wall   thickness was normal. Systolic function was moderately reduced.   The estimated ejection fraction was in the range of 35% to 40%.   Diffuse hypokinesis. Echogenic linear structure at the  apex,   suggestive of calcified apical false tendon. Doppler parameters   are consistent with pseudonormal left ventricular relaxation   (grade 2 diastolic dysfunction). The E/e&' ratio is >15,   suggesting elevated LV filling pressure. - Mitral valve: Mildly thickened leaflets . There was mild   regurgitation. - Left atrium: The atrium was mildly dilated. - Inferior vena cava: The vessel was dilated. The respirophasic   diameter changes were blunted (< 50%), consistent with elevated   central venous pressure.  Impressions:  - Compared to a prior study in 2012, the LVEF is lower at 35-40%.   The LV is mildly dilated and globally hypokinetic. LV filling   pressure appears elevated with grade 2 DD.  Event monitor 10/03/19: Study Highlights   Normal sinus rhythm  Occasional runs of NSVT longest lasting 7 beats.  Infrequent runs of SVT longest 9 beats.  No significant bradycardia or pauses  Average HR 71 bpm   Assessment / Plan:  1. Dilated cardiomyopathy with EF 35-40% in 2018.  Now with symptoms of increased fatigue. May be related to higher Coreg dose. Will reduce Coreg to 25 mg bid. Will update Echo. If EF low may want to switch ACEi to [facility].   2. Hypertension well controlled.  3. PVCs/brief SVT/NSVT.  Continue carvedilol but at lower dose 25 mg bid.   4. SSCA of the tongue. S/p RT and chemo.

## 2020-06-24 ENCOUNTER — Encounter: Payer: Self-pay | Admitting: Cardiology

## 2020-06-24 ENCOUNTER — Ambulatory Visit: Payer: PPO | Admitting: Cardiology

## 2020-06-24 ENCOUNTER — Other Ambulatory Visit: Payer: Self-pay

## 2020-06-24 VITALS — BP 114/50 | HR 75 | Ht 68.0 in | Wt 161.4 lb

## 2020-06-24 DIAGNOSIS — I428 Other cardiomyopathies: Secondary | ICD-10-CM

## 2020-06-24 DIAGNOSIS — I493 Ventricular premature depolarization: Secondary | ICD-10-CM

## 2020-06-24 DIAGNOSIS — I10 Essential (primary) hypertension: Secondary | ICD-10-CM | POA: Diagnosis not present

## 2020-06-24 DIAGNOSIS — I5022 Chronic systolic (congestive) heart failure: Secondary | ICD-10-CM

## 2020-06-24 MED ORDER — CARVEDILOL 25 MG PO TABS
25.0000 mg | ORAL_TABLET | Freq: Two times a day (BID) | ORAL | 3 refills | Status: DC
Start: 2020-06-24 — End: 2020-07-15

## 2020-06-24 NOTE — Addendum Note (Signed)
Addended by: Kathyrn Lass on: 06/24/2020 01:49 PM   Modules accepted: Orders

## 2020-06-24 NOTE — Patient Instructions (Signed)
Reduce Coreg to 25 mg twice a day.   We will check an Echocardiogram

## 2020-06-29 ENCOUNTER — Other Ambulatory Visit: Payer: Self-pay | Admitting: Family Medicine

## 2020-07-03 ENCOUNTER — Telehealth: Payer: Self-pay | Admitting: Cardiology

## 2020-07-03 NOTE — Telephone Encounter (Signed)
Patient states that he has been having some congestion at night and he can hear it in his lungs. He states he came to see Dr. [name] last week and Dr. [name] didn't hear anything but since he states that he can hear it.

## 2020-07-03 NOTE — Telephone Encounter (Signed)
Pt reports having congestion, more at night.  It is in his throat.  Not coughing.  Uses a sinus spray before he goes to bed.  Was seen in Aug by PCP, sinusitis, Z Pak, not much differenceFatigue continues.  Otherwise feels normal.  Encouraged him to discuss w PCP.  Reviewed how if this were related to his heart/chf we would likely see other symptoms -swelling, weight gain, shortness of breath.  He has none of these.    He told me he will wait and have the echo that is scheduled next week, and if that is ok he will call his primary care doctor Thanked me for assistance.

## 2020-07-13 ENCOUNTER — Other Ambulatory Visit: Payer: Self-pay | Admitting: Cardiology

## 2020-07-13 DIAGNOSIS — I10 Essential (primary) hypertension: Secondary | ICD-10-CM

## 2020-07-14 ENCOUNTER — Ambulatory Visit (HOSPITAL_COMMUNITY): Payer: PPO | Attending: Cardiovascular Disease

## 2020-07-14 ENCOUNTER — Other Ambulatory Visit: Payer: Self-pay

## 2020-07-14 DIAGNOSIS — I5022 Chronic systolic (congestive) heart failure: Secondary | ICD-10-CM | POA: Insufficient documentation

## 2020-07-14 DIAGNOSIS — I428 Other cardiomyopathies: Secondary | ICD-10-CM | POA: Diagnosis not present

## 2020-07-14 DIAGNOSIS — I493 Ventricular premature depolarization: Secondary | ICD-10-CM | POA: Insufficient documentation

## 2020-07-14 DIAGNOSIS — I10 Essential (primary) hypertension: Secondary | ICD-10-CM | POA: Diagnosis not present

## 2020-07-14 LAB — ECHOCARDIOGRAM COMPLETE
Area-P 1/2: 4.8 cm2
S' Lateral: 5.1 cm

## 2020-07-15 ENCOUNTER — Ambulatory Visit (INDEPENDENT_AMBULATORY_CARE_PROVIDER_SITE_OTHER): Payer: PPO | Admitting: Cardiology

## 2020-07-15 ENCOUNTER — Other Ambulatory Visit: Payer: Self-pay | Admitting: Cardiology

## 2020-07-15 ENCOUNTER — Encounter: Payer: Self-pay | Admitting: Cardiology

## 2020-07-15 VITALS — BP 144/89 | HR 92 | Ht 68.0 in | Wt 160.0 lb

## 2020-07-15 DIAGNOSIS — I5043 Acute on chronic combined systolic (congestive) and diastolic (congestive) heart failure: Secondary | ICD-10-CM

## 2020-07-15 DIAGNOSIS — I10 Essential (primary) hypertension: Secondary | ICD-10-CM | POA: Diagnosis not present

## 2020-07-15 DIAGNOSIS — I5023 Acute on chronic systolic (congestive) heart failure: Secondary | ICD-10-CM | POA: Diagnosis not present

## 2020-07-15 DIAGNOSIS — I428 Other cardiomyopathies: Secondary | ICD-10-CM

## 2020-07-15 DIAGNOSIS — I493 Ventricular premature depolarization: Secondary | ICD-10-CM | POA: Diagnosis not present

## 2020-07-15 MED ORDER — ENTRESTO 24-26 MG PO TABS: 1.0000 | ORAL_TABLET | Freq: Two times a day (BID) | ORAL | 0 refills | Status: DC

## 2020-07-15 MED ORDER — SODIUM CHLORIDE 0.9% FLUSH: 3.0000 mL | Freq: Two times a day (BID) | INTRAVENOUS | Status: DC

## 2020-07-15 NOTE — Patient Instructions (Addendum)
Medication Instructions:  Stop Quinapril  Start Entresto 24/26 mg twice a day START TAKING ON Friday 07/18/20  Continue all other medications   Lab Work: Cmet,cbc,lipid panel,bnp today  Covid test   Saturday 10/9 at 11:20 am drive thru site [address]   Testing/Procedures: Cardiac cath scheduled 07/22/20   Follow instructions below   Follow-Up: At [facility], you and your health needs are our priority.  As part of our continuing mission to provide you with exceptional heart care, we have created designated Provider Care Teams.  These Care Teams include your primary Cardiologist (physician) and Advanced Practice Providers (APPs -  Physician Assistants and Nurse Practitioners) who all work together to provide you with the care you need, when you need it.  We recommend signing up for the patient portal called "MyChart".  Sign up information is provided on this After Visit Summary.  MyChart is used to connect with patients for Virtual Visits (Telemedicine).  Patients are able to view lab/test results, encounter notes, upcoming appointments, etc.  Non-urgent messages can be sent to your provider as well.   To learn more about what you can do with MyChart, go to [email or web address].    Your next appointment:  Wed 11/24 at 9:40 am   The format for your next appointment: Office     Provider: Dr.Jordan     Appointment with pharmacist  Tues 10/19 at 10:30 am         [place] [address] Dept: [phone] Loc: [place]  07/15/2020  You are scheduled for a Cardiac Cath on Tuesday 07/22/20, with Dr.Jordan.  1. Please arrive at the [facility] (Main Entrance A) at [facility]: [address] [place],  [address] at 7:00 am (This time is two hours before your procedure to ensure your preparation). Free valet  parking service is available.   Special note: Every effort is made to have your procedure done on time. Please understand that emergencies sometimes delay scheduled procedures.  2. Diet: Do not eat solid foods after midnight.  The patient may have clear liquids until 5am upon the day of the procedure.  3. Labs: You will need to have blood drawn on Tuesday 10/5 You do not need to be fasting.  Covid Test  Saturday 07/19/20 at 11:20 am at [place] [facility]  [place] after until [employer].  4. Medication instructions in preparation for your procedure:       On the morning of your procedure, take your Aspirin 81 mg and any morning medicines NOT listed above.  You may use sips of water.  5. Plan for one night stay--bring personal belongings. 6. Bring a current list of your medications and current insurance cards. 7. You MUST have a responsible person to drive you home. 8. Someone MUST be with you the first 24 hours after you arrive home or your discharge will be delayed. 9. Please wear clothes that are easy to get on and off and wear slip-on shoes.  Thank you for allowing [place] to care for you!   -- [facility]

## 2020-07-15 NOTE — Progress Notes (Signed)
Nicholas Wilkerson Date of Birth: [birth year]   History of Present Illness: Nicholas Wilkerson is seen for followup. He has a history of dilated cardiomyopathy with initial ejection fraction of 30% over 18 years ago. Subsequent evaluation has demonstrated improvement to 50-55%. Last Echo was in January 2018 showing decrease in EF to 35-40%. ACEi was resumed at that time. . 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 He was treated with RT and chemo.   Last November he complained of some palpitations. Event monitor short short bursts of SVT and NSVT. No bradycardia or pauses. We increased his Coreg dose at that time.  He was seen this past month and he noted he had extensive Urologic evaluation for an elevated PSA with no cancer found. He did complain of getting tired real easy with activity. Thinks the higher dose of Coreg has slowed him down. No edema. No chest pain. He does note some change in his breathing.  We updated an Echocardiogram which demonstrated significant decrease in LV function with  EF down to 20-25%. There was also evidence of significant pulmonary HTN.   Current Outpatient Medications on File Prior to Visit  Medication Sig Dispense Refill  . aspirin EC 81 MG tablet Take 81 mg by mouth daily.    . brimonidine (ALPHAGAN) 0.2 % ophthalmic solution     . carvedilol (COREG) 25 MG tablet Take 1 tablet (25 mg total) by mouth 2 (two) times daily with a meal. Take 25 mg 1&1/2 tablets twice a day 180 tablet 3  . Cholecalciferol (VITAMIN D3) 50 MCG (2000 UT) TABS Take 1 tablet by mouth daily.    . diclofenac (VOLTAREN) 75 MG EC tablet Take 1 tablet (75 mg total) by mouth 2 (two) times daily. 180 tablet 3  . fluticasone (FLONASE) 50 MCG/ACT nasal spray Place 2 sprays into both nostrils daily. 16 g 11  . latanoprost (XALATAN) 0.005 % ophthalmic solution     . levothyroxine (SYNTHROID) 75 MCG tablet Take 1 tablet (75 mcg total) by mouth daily. 90 tablet 3  . Melatonin 10 MG TABS Take  by mouth.    . Multiple Vitamin (MULTIVITAMIN) tablet Take 1 tablet by mouth daily.    . sodium fluoride (FLUORISHIELD) 1.1 % GEL dental gel Instill one drop of gel per tooth space of fluoride tray. Place over teeth for 5 minutes. Remove. Spit out excess. Repeat nightly. 120 mL prn  . tamsulosin (FLOMAX) 0.4 MG CAPS capsule Take 1 capsule by mouth once daily 90 capsule 0   No current facility-administered medications on file prior to visit.    No Known Allergies  Past Medical History:  Diagnosis Date  . ARTHRITIS 10/22/2008  . BPH (benign prostatic hyperplasia)   . CHF (congestive heart failure) [facility])    sees Dr. Kalani Baray [name]   . Dilated cardiomyopathy ([place])   . DIVERTICULOSIS, COLON 12/08/2007  . Glaucoma    sees Dr. Heather [place]   . H/O asbestos exposure   . History of echocardiogram 05/22/2007   EF was 45-50% / Mild concentric LV hypertrophy with mild global hypokinesis and overall mild systolic dysfunction .  Mild AV sclerosis / Mild Mitral insufficiency / compared to prior study 04/24/02 -- LV function has improved further.    . History of kidney stones   . Hypercholesterolemia   . Hypertension   . Insomnia 06/11/2015  . Radiation 01/01/15-02/18/15   base of tongue and bilateral neck 70 Gy  . Skin cancer  squamous cell, basal cell  . Squamous cell carcinoma 11/20/14   base of tongue primary  . Throat cancer ([place]) 10/2014   had chemo    Past Surgical History:  Procedure Laterality Date  . CARDIAC CATHETERIZATION  10/17/2001   EF estimated at 30% / moderate LV enlargement  / 1. Minimal nonobstructive atherosclerotic coronary artery disease / 2. Severe LV dysfunction with global hypokinesia consistent with dilated nonischemic cardiomyopath / 3. Moderate pulmonary hypertension  . COLONOSCOPY  06/23/2017   per Dr. Carlean Purl, clear, no repeats needed   . LYMPH NODE BIOPSY      Social History   Tobacco Use  Smoking Status Never Smoker  Smokeless Tobacco Never Used     Social History   Substance and Sexual Activity  Alcohol Use Yes  . Alcohol/week: 0.0 standard drinks   Comment: occasional beer, a couple times a month    Family History  Problem Relation Age of Onset  . Stroke Father   . Cancer Father        kidney ca  . Angina Mother   . Colon cancer Neg Hx   . Esophageal cancer Neg Hx   . Rectal cancer Neg Hx   . Stomach cancer Neg Hx     Review of Systems:  All other systems were reviewed and are negative.  Physical Exam: BP (!) 144/89   Pulse 92   Ht 5\' 8"  (1.727 m)   Wt 160 lb (72.6 kg)   SpO2 99%   BMI 24.33 kg/m  GENERAL:  Well appearing WM in NAD HEENT:  PERRL, EOMI, sclera are clear. Oropharynx is clear. NECK:  No jugular venous distention, carotid upstroke brisk and symmetric, no bruits, no thyromegaly or adenopathy LUNGS:  Clear to auscultation bilaterally CHEST:  Unremarkable HEART:  RRR with extrasystoles,  PMI not displaced or sustained,S1 and S2 within normal limits, no S3, no S4: no clicks, no rubs, no murmurs ABD:  Soft, nontender. BS +, no masses or bruits. No hepatomegaly, no splenomegaly EXT:  2 + pulses throughout, no edema, no cyanosis no clubbing SKIN:  Warm and dry.  No rashes NEURO:  Alert and oriented x 3. Cranial nerves II through XII intact. PSYCH:  Cognitively intact      LABORATORY DATA:  Lab Results  Component Value Date   WBC 6.7 10/15/2019   HGB 16.4 10/15/2019   HCT 50.0 10/15/2019   PLT 184.0 10/15/2019   GLUCOSE 114 (H) 10/15/2019   CHOL 212 (H) 10/15/2019   TRIG 102.0 10/15/2019   HDL 41.70 10/15/2019   LDLCALC 150 (H) 10/15/2019   ALT 16 10/15/2019   AST 16 10/15/2019   NA 141 10/15/2019   K 4.3 10/15/2019   CL 103 10/15/2019   CREATININE 1.19 10/15/2019   BUN 17 10/15/2019   CO2 31 10/15/2019   TSH 1.45 05/27/2020   PSA 7.93 (H) 10/15/2019   INR 0.95 07/17/2015   HGBA1C 5.5 05/27/2020     Echo 11/02/16: Study Conclusions  - Left ventricle: The cavity size was  mildly dilated. Wall   thickness was normal. Systolic function was moderately reduced.   The estimated ejection fraction was in the range of 35% to 40%.   Diffuse hypokinesis. Echogenic linear structure at the apex,   suggestive of calcified apical false tendon. Doppler parameters   are consistent with pseudonormal left ventricular relaxation   (grade 2 diastolic dysfunction). The E/e&' ratio is >15,   suggesting elevated LV filling pressure. - Mitral valve:  Mildly thickened leaflets . There was mild   regurgitation. - Left atrium: The atrium was mildly dilated. - Inferior vena cava: The vessel was dilated. The respirophasic   diameter changes were blunted (< 50%), consistent with elevated   central venous pressure.  Impressions:  - Compared to a prior study in 2012, the LVEF is lower at 35-40%.   The LV is mildly dilated and globally hypokinetic. LV filling   pressure appears elevated with grade 2 DD.  Echo 07/14/20: 1. Left ventricular ejection fraction, by estimation, is 20 to 25%. The left ventricle has severely decreased function. The left ventricle demonstrates global hypokinesis. The left ventricular internal cavity size was mildly dilated. Left ventricular diastolic parameters are consistent with Grade III diastolic dysfunction (restrictive). Elevated left ventricular end-diastolic pressure. 2. Right ventricular systolic function is normal. The right ventricular size is normal. There is severely elevated pulmonary artery systolic pressure. 3. Left atrial size was severely dilated. 4. The mitral valve is normal in structure. Mild mitral valve regurgitation. No evidence of mitral stenosis. 5. The aortic valve is tricuspid. There is mild calcification of the aortic valve. There is mild thickening of the aortic valve. Aortic valve regurgitation is not visualized. No aortic stenosis is present. 6. The inferior vena cava is normal in size with <50% respiratory variability,  suggesting right atrial pressure of 8 mmHg.  Event monitor 10/03/19: Study Highlights   Normal sinus rhythm  Occasional runs of NSVT longest lasting 7 beats.  Infrequent runs of SVT longest 9 beats.  No significant bradycardia or pauses  Average HR 71 bpm    Assessment / Plan:  1. Dilated cardiomyopathy with EF 35-40% in 2018.  Now with symptoms of increased fatigue and dyspnea. Echo shows significant decline in LV function with EF 20-25%.  Will continue Coreg at 25 mg bid. Will stop taking Quinapril. Last took this on Oct 4. Will start Entresto 24/26 mg bid. Will need close follow up with Pharm D to titrate CHF medication. If tolerates [name] will need to consider adding aldactone and well as a SGLT2 inhibitor. Will anticipate repeating Echo once medical therapy optimized. I have also recommended a right and left heart cath. Discussed indication for cath as well as potential risks. The procedure and risks were reviewed including but not limited to death, myocardial infarction, stroke, arrythmias, bleeding, transfusion, emergency surgery, dye allergy, or renal dysfunction. The patient voices understanding and is agreeable to proceed. Will schedule for next week.   2. Hypertension well controlled.  3. PVCs/brief SVT/NSVT.  Continue carvedilol but at lower dose 25 mg bid.   4. SSCA of the tongue. S/p RT and chemo.

## 2020-07-15 NOTE — H&P (View-Only) (Signed)
Nicholas Wilkerson Date of Birth: 1941/08/12   History of Present Illness: Nicholas Wilkerson is seen for followup. He has a history of dilated cardiomyopathy with initial ejection fraction of 30% over 18 years ago. Subsequent evaluation has demonstrated improvement to 50-55%. Last Echo was in January 2018 showing decrease in EF to 35-40%. ACEi was resumed at that time. . 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 He was treated with RT and chemo.   Last November he complained of some palpitations. Event monitor short short bursts of SVT and NSVT. No bradycardia or pauses. We increased his Coreg dose at that time.  He was seen this past month and he noted he had extensive Urologic evaluation for an elevated PSA with no cancer found. He did complain of getting tired real easy with activity. Thinks the higher dose of Coreg has slowed him down. No edema. No chest pain. He does note some change in his breathing.  We updated an Echocardiogram which demonstrated significant decrease in LV function with  EF down to 20-25%. There was also evidence of significant pulmonary HTN.   Current Outpatient Medications on File Prior to Visit  Medication Sig Dispense Refill  . aspirin EC 81 MG tablet Take 81 mg by mouth daily.    . brimonidine (ALPHAGAN) 0.2 % ophthalmic solution     . carvedilol (COREG) 25 MG tablet Take 1 tablet (25 mg total) by mouth 2 (two) times daily with a meal. Take 25 mg 1&1/2 tablets twice a day 180 tablet 3  . Cholecalciferol (VITAMIN D3) 50 MCG (2000 UT) TABS Take 1 tablet by mouth daily.    . diclofenac (VOLTAREN) 75 MG EC tablet Take 1 tablet (75 mg total) by mouth 2 (two) times daily. 180 tablet 3  . fluticasone (FLONASE) 50 MCG/ACT nasal spray Place 2 sprays into both nostrils daily. 16 g 11  . latanoprost (XALATAN) 0.005 % ophthalmic solution     . levothyroxine (SYNTHROID) 75 MCG tablet Take 1 tablet (75 mcg total) by mouth daily. 90 tablet 3  . Melatonin 10 MG TABS Take  by mouth.    . Multiple Vitamin (MULTIVITAMIN) tablet Take 1 tablet by mouth daily.    . sodium fluoride (FLUORISHIELD) 1.1 % GEL dental gel Instill one drop of gel per tooth space of fluoride tray. Place over teeth for 5 minutes. Remove. Spit out excess. Repeat nightly. 120 mL prn  . tamsulosin (FLOMAX) 0.4 MG CAPS capsule Take 1 capsule by mouth once daily 90 capsule 0   No current facility-administered medications on file prior to visit.    No Known Allergies  Past Medical History:  Diagnosis Date  . ARTHRITIS 10/22/2008  . BPH (benign prostatic hyperplasia)   . CHF (congestive heart failure) [facility])    sees Dr. Catricia Scheerer [name]   . Dilated cardiomyopathy ([place])   . DIVERTICULOSIS, COLON 12/08/2007  . Glaucoma    sees Dr. Heather [place]   . H/O asbestos exposure   . History of echocardiogram 05/22/2007   EF was 45-50% / Mild concentric LV hypertrophy with mild global hypokinesis and overall mild systolic dysfunction .  Mild AV sclerosis / Mild Mitral insufficiency / compared to prior study 04/24/02 -- LV function has improved further.    . History of kidney stones   . Hypercholesterolemia   . Hypertension   . Insomnia 06/11/2015  . Radiation 01/01/15-02/18/15   base of tongue and bilateral neck 70 Gy  . Skin cancer  squamous cell, basal cell  . Squamous cell carcinoma 11/20/14   base of tongue primary  . Throat cancer ([place]) 10/2014   had chemo    Past Surgical History:  Procedure Laterality Date  . CARDIAC CATHETERIZATION  10/17/2001   EF estimated at 30% / moderate LV enlargement  / 1. Minimal nonobstructive atherosclerotic coronary artery disease / 2. Severe LV dysfunction with global hypokinesia consistent with dilated nonischemic cardiomyopath / 3. Moderate pulmonary hypertension  . COLONOSCOPY  06/23/2017   per Dr. Carlean Purl, clear, no repeats needed   . LYMPH NODE BIOPSY      Social History   Tobacco Use  Smoking Status Never Smoker  Smokeless Tobacco Never Used     Social History   Substance and Sexual Activity  Alcohol Use Yes  . Alcohol/week: 0.0 standard drinks   Comment: occasional beer, a couple times a month    Family History  Problem Relation Age of Onset  . Stroke Father   . Cancer Father        kidney ca  . Angina Mother   . Colon cancer Neg Hx   . Esophageal cancer Neg Hx   . Rectal cancer Neg Hx   . Stomach cancer Neg Hx     Review of Systems:  All other systems were reviewed and are negative.  Physical Exam: BP (!) 144/89   Pulse 92   Ht 5\' 8"  (1.727 m)   Wt 160 lb (72.6 kg)   SpO2 99%   BMI 24.33 kg/m  GENERAL:  Well appearing Nicholas Wilkerson in NAD HEENT:  PERRL, EOMI, sclera are clear. Oropharynx is clear. NECK:  No jugular venous distention, carotid upstroke brisk and symmetric, no bruits, no thyromegaly or adenopathy LUNGS:  Clear to auscultation bilaterally CHEST:  Unremarkable HEART:  RRR with extrasystoles,  PMI not displaced or sustained,S1 and S2 within normal limits, no S3, no S4: no clicks, no rubs, no murmurs ABD:  Soft, nontender. BS +, no masses or bruits. No hepatomegaly, no splenomegaly EXT:  2 + pulses throughout, no edema, no cyanosis no clubbing SKIN:  Warm and dry.  No rashes NEURO:  Alert and oriented x 3. Cranial nerves II through XII intact. PSYCH:  Cognitively intact      LABORATORY DATA:  Lab Results  Component Value Date   WBC 6.7 10/15/2019   HGB 16.4 10/15/2019   HCT 50.0 10/15/2019   PLT 184.0 10/15/2019   GLUCOSE 114 (H) 10/15/2019   CHOL 212 (H) 10/15/2019   TRIG 102.0 10/15/2019   HDL 41.70 10/15/2019   LDLCALC 150 (H) 10/15/2019   ALT 16 10/15/2019   AST 16 10/15/2019   NA 141 10/15/2019   K 4.3 10/15/2019   CL 103 10/15/2019   CREATININE 1.19 10/15/2019   BUN 17 10/15/2019   CO2 31 10/15/2019   TSH 1.45 05/27/2020   PSA 7.93 (H) 10/15/2019   INR 0.95 07/17/2015   HGBA1C 5.5 05/27/2020     Echo 11/02/16: Study Conclusions  - Left ventricle: The cavity size was  mildly dilated. Wall   thickness was normal. Systolic function was moderately reduced.   The estimated ejection fraction was in the range of 35% to 40%.   Diffuse hypokinesis. Echogenic linear structure at the apex,   suggestive of calcified apical false tendon. Doppler parameters   are consistent with pseudonormal left ventricular relaxation   (grade 2 diastolic dysfunction). The E/e&' ratio is >15,   suggesting elevated LV filling pressure. - Mitral valve:  Mildly thickened leaflets . There was mild   regurgitation. - Left atrium: The atrium was mildly dilated. - Inferior vena cava: The vessel was dilated. The respirophasic   diameter changes were blunted (< 50%), consistent with elevated   central venous pressure.  Impressions:  - Compared to a prior study in 2012, the LVEF is lower at 35-40%.   The LV is mildly dilated and globally hypokinetic. LV filling   pressure appears elevated with grade 2 DD.  Echo 07/14/20: 1. Left ventricular ejection fraction, by estimation, is 20 to 25%. The left ventricle has severely decreased function. The left ventricle demonstrates global hypokinesis. The left ventricular internal cavity size was mildly dilated. Left ventricular diastolic parameters are consistent with Grade III diastolic dysfunction (restrictive). Elevated left ventricular end-diastolic pressure. 2. Right ventricular systolic function is normal. The right ventricular size is normal. There is severely elevated pulmonary artery systolic pressure. 3. Left atrial size was severely dilated. 4. The mitral valve is normal in structure. Mild mitral valve regurgitation. No evidence of mitral stenosis. 5. The aortic valve is tricuspid. There is mild calcification of the aortic valve. There is mild thickening of the aortic valve. Aortic valve regurgitation is not visualized. No aortic stenosis is present. 6. The inferior vena cava is normal in size with <50% respiratory variability,  suggesting right atrial pressure of 8 mmHg.  Event monitor 10/03/19: Study Highlights   Normal sinus rhythm  Occasional runs of NSVT longest lasting 7 beats.  Infrequent runs of SVT longest 9 beats.  No significant bradycardia or pauses  Average HR 71 bpm    Assessment / Plan:  1. Dilated cardiomyopathy with EF 35-40% in 2018.  Now with symptoms of increased fatigue and dyspnea. Echo shows significant decline in LV function with EF 20-25%.  Will continue Coreg at 25 mg bid. Will stop taking Quinapril. Last took this on Oct 4. Will start Entresto 24/26 mg bid. Will need close follow up with Pharm D to titrate CHF medication. If tolerates [name] will need to consider adding aldactone and well as a SGLT2 inhibitor. Will anticipate repeating Echo once medical therapy optimized. I have also recommended a right and left heart cath. Discussed indication for cath as well as potential risks. The procedure and risks were reviewed including but not limited to death, myocardial infarction, stroke, arrythmias, bleeding, transfusion, emergency surgery, dye allergy, or renal dysfunction. The patient voices understanding and is agreeable to proceed. Will schedule for next week.   2. Hypertension well controlled.  3. PVCs/brief SVT/NSVT.  Continue carvedilol but at lower dose 25 mg bid.   4. SSCA of the tongue. S/p RT and chemo.

## 2020-07-16 LAB — LIPID PANEL
Chol/HDL Ratio: 5.1 ratio — ABNORMAL HIGH (ref 0.0–5.0)
Cholesterol, Total: 184 mg/dL (ref 100–199)
HDL: 36 mg/dL — ABNORMAL LOW (ref >39)
LDL Chol Calc (NIH): 121 mg/dL — ABNORMAL HIGH (ref 0–99)
Triglycerides: 153 mg/dL — ABNORMAL HIGH (ref 0–149)
VLDL Cholesterol Cal: 27 mg/dL (ref 5–40)

## 2020-07-16 LAB — COMPREHENSIVE METABOLIC PANEL
ALT: 21 IU/L (ref 0–44)
AST: 20 IU/L (ref 0–40)
Albumin/Globulin Ratio: 2.4 — ABNORMAL HIGH (ref 1.2–2.2)
Albumin: 4.7 g/dL (ref 3.7–4.7)
Alkaline Phosphatase: 85 IU/L (ref 44–121)
BUN/Creatinine Ratio: 11 (ref 10–24)
BUN: 15 mg/dL (ref 8–27)
Bilirubin Total: 0.5 mg/dL (ref 0.0–1.2)
CO2: 25 mmol/L (ref 20–29)
Calcium: 9.6 mg/dL (ref 8.6–10.2)
Chloride: 106 mmol/L (ref 96–106)
Creatinine, Ser: 1.4 mg/dL — ABNORMAL HIGH (ref 0.76–1.27)
GFR calc Af Amer: 56 mL/min/{1.73_m2} — ABNORMAL LOW (ref >59)
GFR calc non Af Amer: 48 mL/min/{1.73_m2} — ABNORMAL LOW (ref >59)
Globulin, Total: 2 g/dL (ref 1.5–4.5)
Glucose: 101 mg/dL — ABNORMAL HIGH (ref 65–99)
Potassium: 4.7 mmol/L (ref 3.5–5.2)
Sodium: 144 mmol/L (ref 134–144)
Total Protein: 6.7 g/dL (ref 6.0–8.5)

## 2020-07-16 LAB — CBC WITH DIFFERENTIAL/PLATELET
Basophils Absolute: 0.1 10*3/uL (ref 0.0–0.2)
Basos: 1 %
EOS (ABSOLUTE): 0.1 10*3/uL (ref 0.0–0.4)
Eos: 1 %
Hematocrit: 51.2 % — ABNORMAL HIGH (ref 37.5–51.0)
Hemoglobin: 17 g/dL (ref 13.0–17.7)
Immature Grans (Abs): 0 10*3/uL (ref 0.0–0.1)
Immature Granulocytes: 0 %
Lymphocytes Absolute: 0.9 10*3/uL (ref 0.7–3.1)
Lymphs: 15 %
MCH: 30 pg (ref 26.6–33.0)
MCHC: 33.2 g/dL (ref 31.5–35.7)
MCV: 91 fL (ref 79–97)
Monocytes Absolute: 0.7 10*3/uL (ref 0.1–0.9)
Monocytes: 10 %
Neutrophils Absolute: 4.6 10*3/uL (ref 1.4–7.0)
Neutrophils: 73 %
Platelets: 208 10*3/uL (ref 150–450)
RBC: 5.66 x10E6/uL (ref 4.14–5.80)
RDW: 13.1 % (ref 11.6–15.4)
WBC: 6.4 10*3/uL (ref 3.4–10.8)

## 2020-07-16 LAB — BRAIN NATRIURETIC PEPTIDE: BNP: 763.5 pg/mL — ABNORMAL HIGH (ref 0.0–100.0)

## 2020-07-17 ENCOUNTER — Telehealth: Payer: Self-pay | Admitting: Cardiology

## 2020-07-17 NOTE — Telephone Encounter (Signed)
New Message:    Pt says he is having a Cath next week. He says he need to ask you a question please.

## 2020-07-17 NOTE — Telephone Encounter (Signed)
Spoke to patient he was calling to verify if ok to take his morning medications before cath.Advised ok to take.Advised take Aspirin 81 mg before leaving home too.

## 2020-07-19 ENCOUNTER — Other Ambulatory Visit (HOSPITAL_COMMUNITY)
Admission: RE | Admit: 2020-07-19 | Discharge: 2020-07-19 | Disposition: A | Discharge status: Home / Self Care | Payer: PPO | Source: Ambulatory Visit | Attending: Cardiology | Admitting: Cardiology

## 2020-07-19 DIAGNOSIS — Z01812 Encounter for preprocedural laboratory examination: Secondary | ICD-10-CM | POA: Diagnosis not present

## 2020-07-19 DIAGNOSIS — Z20822 Contact with and (suspected) exposure to covid-19: Secondary | ICD-10-CM | POA: Diagnosis not present

## 2020-07-19 LAB — SARS CORONAVIRUS 2 (TAT 6-24 HRS): SARS Coronavirus 2: NEGATIVE

## 2020-07-21 ENCOUNTER — Telehealth: Payer: Self-pay | Admitting: *Deleted

## 2020-07-21 NOTE — Telephone Encounter (Addendum)
Pt contacted pre-catheterization scheduled at [facility] for: Tuesday July 22, 2020 9 AM Verified arrival time and place: [place] [facility]) at: 7 AM   No solid food after midnight prior to cath, clear liquids until 5 AM day of procedure.  Hold: Entresto-AM of procedure-GFR 48 Voltaren-until post procedure  Except hold medications AM meds can be  taken pre-cath with sips of water including: ASA 81 mg   Confirmed patient has responsible adult to drive home post procedure and be with patient first 24 hours after arriving home: yes  You are allowed ONE visitor in the waiting room during the time you are at the hospital for your procedure. Both you and your visitor must wear a mask once you enter the hospital.       COVID-19 Pre-Screening Questions:  . In the past 14 days have you had a new cough, new headache, new nasal congestion, fever (100.4 or greater) unexplained body aches, new sore throat, or sudden loss of taste or sense of smell? no . In the past 14 days have you been around anyone with known Covid 19? no . Have you been vaccinated for COVID-19? Yes, see immunization history  Reviewed procedure/mask/visitor instructions, COVID-19 questions with patient.

## 2020-07-22 ENCOUNTER — Encounter (HOSPITAL_COMMUNITY): Payer: Self-pay | Admitting: Cardiology

## 2020-07-22 ENCOUNTER — Encounter (HOSPITAL_COMMUNITY)
Admission: RE | Disposition: A | Discharge status: Home / Self Care | Payer: PPO | Source: Home / Self Care | Attending: Cardiology

## 2020-07-22 ENCOUNTER — Telehealth: Payer: Self-pay | Admitting: Cardiology

## 2020-07-22 ENCOUNTER — Ambulatory Visit (HOSPITAL_COMMUNITY)
Admission: RE | Admit: 2020-07-22 | Discharge: 2020-07-22 | Disposition: A | Discharge status: Home / Self Care | Payer: PPO | Attending: Cardiology | Admitting: Cardiology

## 2020-07-22 ENCOUNTER — Other Ambulatory Visit: Payer: Self-pay

## 2020-07-22 DIAGNOSIS — Z79890 Hormone replacement therapy (postmenopausal): Secondary | ICD-10-CM | POA: Diagnosis not present

## 2020-07-22 DIAGNOSIS — I472 Ventricular tachycardia: Secondary | ICD-10-CM | POA: Insufficient documentation

## 2020-07-22 DIAGNOSIS — N400 Enlarged prostate without lower urinary tract symptoms: Secondary | ICD-10-CM | POA: Insufficient documentation

## 2020-07-22 DIAGNOSIS — I5043 Acute on chronic combined systolic (congestive) and diastolic (congestive) heart failure: Secondary | ICD-10-CM

## 2020-07-22 DIAGNOSIS — H409 Unspecified glaucoma: Secondary | ICD-10-CM | POA: Insufficient documentation

## 2020-07-22 DIAGNOSIS — I2510 Atherosclerotic heart disease of native coronary artery without angina pectoris: Secondary | ICD-10-CM | POA: Diagnosis not present

## 2020-07-22 DIAGNOSIS — Z9221 Personal history of antineoplastic chemotherapy: Secondary | ICD-10-CM | POA: Insufficient documentation

## 2020-07-22 DIAGNOSIS — I471 Supraventricular tachycardia: Secondary | ICD-10-CM | POA: Insufficient documentation

## 2020-07-22 DIAGNOSIS — Z85828 Personal history of other malignant neoplasm of skin: Secondary | ICD-10-CM | POA: Insufficient documentation

## 2020-07-22 DIAGNOSIS — Z791 Long term (current) use of non-steroidal anti-inflammatories (NSAID): Secondary | ICD-10-CM | POA: Diagnosis not present

## 2020-07-22 DIAGNOSIS — I420 Dilated cardiomyopathy: Secondary | ICD-10-CM | POA: Diagnosis not present

## 2020-07-22 DIAGNOSIS — Z8249 Family history of ischemic heart disease and other diseases of the circulatory system: Secondary | ICD-10-CM | POA: Diagnosis not present

## 2020-07-22 DIAGNOSIS — Z8051 Family history of malignant neoplasm of kidney: Secondary | ICD-10-CM | POA: Insufficient documentation

## 2020-07-22 DIAGNOSIS — I5023 Acute on chronic systolic (congestive) heart failure: Secondary | ICD-10-CM | POA: Insufficient documentation

## 2020-07-22 DIAGNOSIS — Z7982 Long term (current) use of aspirin: Secondary | ICD-10-CM | POA: Diagnosis not present

## 2020-07-22 DIAGNOSIS — E7800 Pure hypercholesterolemia, unspecified: Secondary | ICD-10-CM | POA: Insufficient documentation

## 2020-07-22 DIAGNOSIS — Z923 Personal history of irradiation: Secondary | ICD-10-CM | POA: Diagnosis not present

## 2020-07-22 DIAGNOSIS — M1990 Unspecified osteoarthritis, unspecified site: Secondary | ICD-10-CM | POA: Diagnosis not present

## 2020-07-22 DIAGNOSIS — I110 Hypertensive heart disease with heart failure: Secondary | ICD-10-CM | POA: Insufficient documentation

## 2020-07-22 DIAGNOSIS — I10 Essential (primary) hypertension: Secondary | ICD-10-CM | POA: Diagnosis present

## 2020-07-22 DIAGNOSIS — Z85810 Personal history of malignant neoplasm of tongue: Secondary | ICD-10-CM | POA: Diagnosis not present

## 2020-07-22 DIAGNOSIS — Z79899 Other long term (current) drug therapy: Secondary | ICD-10-CM | POA: Insufficient documentation

## 2020-07-22 DIAGNOSIS — I272 Pulmonary hypertension, unspecified: Secondary | ICD-10-CM | POA: Diagnosis not present

## 2020-07-22 DIAGNOSIS — Z77090 Contact with and (suspected) exposure to asbestos: Secondary | ICD-10-CM | POA: Insufficient documentation

## 2020-07-22 HISTORY — PX: RIGHT/LEFT HEART CATH AND CORONARY ANGIOGRAPHY: CATH118266

## 2020-07-22 LAB — POCT I-STAT 7, (LYTES, BLD GAS, ICA,H+H)
Acid-base deficit: 1 mmol/L (ref 0.0–2.0)
Bicarbonate: 24.4 mmol/L (ref 20.0–28.0)
Calcium, Ion: 1.24 mmol/L (ref 1.15–1.40)
HCT: 53 % — ABNORMAL HIGH (ref 39.0–52.0)
Hemoglobin: 18 g/dL — ABNORMAL HIGH (ref 13.0–17.0)
O2 Saturation: 99 %
Potassium: 4 mmol/L (ref 3.5–5.1)
Sodium: 142 mmol/L (ref 135–145)
TCO2: 26 mmol/L (ref 22–32)
pCO2 arterial: 42 mmHg (ref 32.0–48.0)
pH, Arterial: 7.372 (ref 7.350–7.450)
pO2, Arterial: 119 mmHg — ABNORMAL HIGH (ref 83.0–108.0)

## 2020-07-22 LAB — POCT I-STAT EG7
Acid-Base Excess: 0 mmol/L (ref 0.0–2.0)
Bicarbonate: 26.4 mmol/L (ref 20.0–28.0)
Calcium, Ion: 1.25 mmol/L (ref 1.15–1.40)
HCT: 53 % — ABNORMAL HIGH (ref 39.0–52.0)
Hemoglobin: 18 g/dL — ABNORMAL HIGH (ref 13.0–17.0)
O2 Saturation: 68 %
Potassium: 3.9 mmol/L (ref 3.5–5.1)
Sodium: 142 mmol/L (ref 135–145)
TCO2: 28 mmol/L (ref 22–32)
pCO2, Ven: 45.7 mmHg (ref 44.0–60.0)
pH, Ven: 7.369 (ref 7.250–7.430)
pO2, Ven: 37 mmHg (ref 32.0–45.0)

## 2020-07-22 SURGERY — RIGHT/LEFT HEART CATH AND CORONARY ANGIOGRAPHY
Anesthesia: LOCAL

## 2020-07-22 MED ORDER — SODIUM CHLORIDE 0.9% FLUSH: 3.0000 mL | INTRAVENOUS | Status: DC | PRN

## 2020-07-22 MED ORDER — ACETAMINOPHEN 325 MG PO TABS: 650.0000 mg | ORAL_TABLET | ORAL | Status: DC | PRN

## 2020-07-22 MED ORDER — ASPIRIN 81 MG PO CHEW: 81.0000 mg | CHEWABLE_TABLET | ORAL | Status: DC

## 2020-07-22 MED ORDER — SODIUM CHLORIDE 0.9 % IV SOLN: 250.0000 mL | INTRAVENOUS | Status: DC | PRN

## 2020-07-22 MED ORDER — FUROSEMIDE 40 MG PO TABS: 40.0000 mg | ORAL_TABLET | Freq: Every day | ORAL | 3 refills | Status: DC

## 2020-07-22 MED ORDER — IOHEXOL 350 MG/ML SOLN
INTRAVENOUS | Status: DC | PRN
  Administered 2020-07-22: 30 mL

## 2020-07-22 MED ORDER — HEPARIN (PORCINE) IN NACL 1000-0.9 UT/500ML-% IV SOLN
INTRAVENOUS | Status: DC | PRN
  Administered 2020-07-22 (×2): 500 mL

## 2020-07-22 MED ORDER — SODIUM CHLORIDE 0.9% FLUSH: 3.0000 mL | Freq: Two times a day (BID) | INTRAVENOUS | Status: DC

## 2020-07-22 MED ORDER — VERAPAMIL HCL 2.5 MG/ML IV SOLN
INTRAVENOUS | Status: AC
  Filled 2020-07-22: qty 2

## 2020-07-22 MED ORDER — HEPARIN (PORCINE) IN NACL 1000-0.9 UT/500ML-% IV SOLN
INTRAVENOUS | Status: AC
  Filled 2020-07-22: qty 1000

## 2020-07-22 MED ORDER — HEPARIN SODIUM (PORCINE) 1000 UNIT/ML IJ SOLN
INTRAMUSCULAR | Status: AC
  Filled 2020-07-22: qty 1

## 2020-07-22 MED ORDER — VERAPAMIL HCL 2.5 MG/ML IV SOLN
INTRAVENOUS | Status: DC | PRN
  Administered 2020-07-22: 10 mL via INTRA_ARTERIAL

## 2020-07-22 MED ORDER — HEPARIN SODIUM (PORCINE) 1000 UNIT/ML IJ SOLN
INTRAMUSCULAR | Status: DC | PRN
  Administered 2020-07-22: 4000 [IU] via INTRAVENOUS

## 2020-07-22 MED ORDER — LIDOCAINE HCL (PF) 1 % IJ SOLN
INTRAMUSCULAR | Status: AC
  Filled 2020-07-22: qty 30

## 2020-07-22 MED ORDER — ONDANSETRON HCL 4 MG/2ML IJ SOLN: 4.0000 mg | Freq: Four times a day (QID) | INTRAMUSCULAR | Status: DC | PRN

## 2020-07-22 MED ORDER — LIDOCAINE HCL (PF) 1 % IJ SOLN
INTRAMUSCULAR | Status: DC | PRN
  Administered 2020-07-22: 5 mL

## 2020-07-22 MED ORDER — SODIUM CHLORIDE 0.9 % IV SOLN: INTRAVENOUS | Status: DC

## 2020-07-22 SURGICAL SUPPLY — 14 items
CATH 5FR JL3.5 JR4 ANG PIG MP (CATHETERS) ×1 IMPLANT
CATH SWAN DBL LUMAN 5F 110 (CATHETERS) ×1 IMPLANT
CATH SWAN GANZ 7F STRAIGHT (CATHETERS) ×1 IMPLANT
DEVICE RAD COMP TR BAND LRG (VASCULAR PRODUCTS) ×1 IMPLANT
GLIDESHEATH SLEND SS 6F .021 (SHEATH) ×2 IMPLANT
GLIDESHEATH SLENDER 7FR .021G (SHEATH) ×1 IMPLANT
GUIDEWIRE .025 260CM (WIRE) ×1 IMPLANT
GUIDEWIRE INQWIRE 1.5J.035X260 (WIRE) IMPLANT
INQWIRE 1.5J .035X260CM (WIRE) ×2
KIT HEART LEFT (KITS) ×2 IMPLANT
PACK CARDIAC CATHETERIZATION (CUSTOM PROCEDURE TRAY) ×2 IMPLANT
TRANSDUCER W/STOPCOCK (MISCELLANEOUS) ×2 IMPLANT
TUBING CIL FLEX 10 FLL-RA (TUBING) ×2 IMPLANT
WIRE ASAHI PROWATER 180CM (WIRE) ×1 IMPLANT

## 2020-07-22 NOTE — Research (Signed)
[place] Informed Consent   Subject Name: Nicholas Wilkerson  Subject met inclusion and exclusion criteria.  The informed consent form, study requirements and expectations were reviewed with the subject and questions and concerns were addressed prior to the signing of the consent form.  The subject verbalized understanding of the trial requirements.  The subject agreed to participate in the [facility] trial and signed the informed consent at 0750 on 07/22/2020.  The informed consent was obtained prior to performance of any protocol-specific procedures for the subject.  A copy of the signed informed consent was given to the subject and a copy was placed in the subject's medical record.   Andreana Klingerman

## 2020-07-22 NOTE — Interval H&P Note (Signed)
History and Physical Interval Note:  07/22/2020 8:59 AM  Nicholas Wilkerson  has presented today for surgery, with the diagnosis of Congestive Heart Failure.  The various methods of treatment have been discussed with the patient and family. After consideration of risks, benefits and other options for treatment, the patient has consented to  Procedure(s): RIGHT/LEFT HEART CATH AND CORONARY ANGIOGRAPHY (N/A) as a surgical intervention.  The patient's history has been reviewed, patient examined, no change in status, stable for surgery.  I have reviewed the patient's chart and labs.  Questions were answered to the patient's satisfaction.    Cath Lab Visit (complete for each Cath Lab visit)  Clinical Evaluation Leading to the Procedure:   ACS: No.  Non-ACS:    Anginal Classification: CCS II  Anti-ischemic medical therapy: Minimal Therapy (1 class of medications)  Non-Invasive Test Results: No non-invasive testing performed  Prior CABG: No previous CABG       Nicholas Wilkerson [facility] 07/22/2020 8:59 AM

## 2020-07-22 NOTE — Progress Notes (Signed)
Pt states he has an abscess under his left arm. Area red with swelling noted under his left arm. Rennis Harding, Rn called and informed. States she will come over and assess area.

## 2020-07-22 NOTE — Discharge Instructions (Addendum)
Add Lasix 40 mg daily orally  Radial Site Care  This sheet gives you information about how to care for yourself after your procedure. Your health care provider may also give you more specific instructions. If you have problems or questions, contact your health care provider. What can I expect after the procedure? After the procedure, it is common to have:  Bruising and tenderness at the catheter insertion area. Follow these instructions at home: Medicines  Take over-the-counter and prescription medicines only as told by your health care provider. Insertion site care  Follow instructions from your health care provider about how to take care of your insertion site. Make sure you: ? Wash your hands with soap and water before you change your bandage (dressing). If soap and water are not available, use hand sanitizer. ? Change your dressing as told by your health care provider. ? Leave stitches (sutures), skin glue, or adhesive strips in place. These skin closures may need to stay in place for 2 weeks or longer. If adhesive strip edges start to loosen and curl up, you may trim the loose edges. Do not remove adhesive strips completely unless your health care provider tells you to do that.  Check your insertion site every day for signs of infection. Check for: ? Redness, swelling, or pain. ? Fluid or blood. ? Pus or a bad smell. ? Warmth.  Do not take baths, swim, or use a hot tub until your health care provider approves.  You may shower 24-48 hours after the procedure, or as directed by your health care provider. ? Remove the dressing and gently wash the site with plain soap and water. ? Pat the area dry with a clean towel. ? Do not rub the site. That could cause bleeding.  Do not apply powder or lotion to the site. Activity   For 24 hours after the procedure, or as directed by your health care provider: ? Do not flex or bend the affected arm. ? Do not push or pull heavy objects with  the affected arm. ? Do not drive yourself home from the hospital or clinic. You may drive 24 hours after the procedure unless your health care provider tells you not to. ? Do not operate machinery or power tools.  Do not lift anything that is heavier than 10 lb (4.5 kg), or the limit that you are told, until your health care provider says that it is safe.  Ask your health care provider when it is okay to: ? Return to work or school. ? Resume usual physical activities or sports. ? Resume sexual activity. General instructions  If the catheter site starts to bleed, raise your arm and put firm pressure on the site. If the bleeding does not stop, get help right away. This is a medical emergency.  If you went home on the same day as your procedure, a responsible adult should be with you for the first 24 hours after you arrive home.  Keep all follow-up visits as told by your health care provider. This is important. Contact a health care provider if:  You have a fever.  You have redness, swelling, or yellow drainage around your insertion site. Get help right away if:  You have unusual pain at the radial site.  The catheter insertion area swells very fast.  The insertion area is bleeding, and the bleeding does not stop when you hold steady pressure on the area.  Your arm or hand becomes pale, cool, tingly, or numb.  These symptoms may represent a serious problem that is an emergency. Do not wait to see if the symptoms will go away. Get medical help right away. Call your local emergency services (911 in the [place] Do not drive yourself to the hospital. Summary  After the procedure, it is common to have bruising and tenderness at the site.  Follow instructions from your health care provider about how to take care of your radial site wound. Check the wound every day for signs of infection.  Do not lift anything that is heavier than 10 lb (4.5 kg), or the limit that you are told, until your  health care provider says that it is safe. This information is not intended to replace advice given to you by your health care provider. Make sure you discuss any questions you have with your health care provider. Document Revised: 11/02/2017 Document Reviewed: 11/02/2017 Elsevier Patient Education  [place]. Add Lasix 40 mg daily orally

## 2020-07-22 NOTE — Telephone Encounter (Signed)
New Message:    Need the correct directions for pt's Carvedilol.

## 2020-07-22 NOTE — Telephone Encounter (Signed)
Spoke with Maryann Conners and made her aware that per Dr. Doug Sou last [place] note states to continue Coreg at 25mg  BID.  Catrina appreciative for call.

## 2020-07-29 ENCOUNTER — Ambulatory Visit (INDEPENDENT_AMBULATORY_CARE_PROVIDER_SITE_OTHER): Payer: PPO | Admitting: Pharmacist Clinician (PhC)/ Clinical Pharmacy Specialist

## 2020-07-29 ENCOUNTER — Other Ambulatory Visit: Payer: Self-pay

## 2020-07-29 DIAGNOSIS — I5023 Acute on chronic systolic (congestive) heart failure: Secondary | ICD-10-CM

## 2020-07-29 NOTE — Progress Notes (Signed)
07/29/2020 Nicholas Wilkerson 03-26-1941 [id number]   HPI:  Nicholas Wilkerson is a 79 y.o. male patient of Nicholas Wilkerson, with a [place] below who presents today for heart failure medication titration.  He was seen by Nicholas. [name] on October 5, when his quinapril was switched to [facility] 24/26 mg.  On Oct 12 he had a right/left heart cath which showed non-obstructive CAD, moderate pulmonary hypertension, low cardiac output and moderately elevated LV filling pressures.    Today he returns for Entresto titration and possible addition of SLGT-2 inhibitor and spironolactone.  He reports feeling "great".  Energy levels have increased and overall he feels that the Nicholas Wilkerson is having a positive effect.  He is a little concerned about cost, so we will keep this in mind when looking at adding an SGLT-2 inhibitor.  In researching his health plan, he has a $45/month or $90/3 month copay on both Entresto and Jardiance.  This would also put him in the donut hole fairly quickly in the year, leading to costs of   Entresto, Jardiance - both  $45/month $90/3 months until donut hole.  Unfortunately at that time he would then have costs of about $400 for each per 3 month supply.  This would be unabtainable for him.    Past Medical History: HFrEF 10/21 echo showed EF worsened to 20-25%, with evidence of pulmonary hypertension  Non-obstructive CAD 15-25% obstruction in RCA, LM, LCx  hyperlipidemia 10/21 - TC 184, TG 153, HDL 36, LDL 121  hypertension Controlled with Entresto, carvedilol  Cancer of tongue Squamous cell carcinoma of base of tongue/throat - treated with chemo/radiation (2016)     Blood Pressure Goal:  130/80  Current Medications: Entresto 24/26, carvedilol 25 mg bid  Family Hx: father died from stroke at 11, mother from aneurysm in her 68s; 4 daughters all healthy  Social Hx: no tobacco, occasional alcohol  Home BP readings: has home cuff, no readings today  Intolerances: nkda  Labs: 10/21: Na 144,  K 4.7, Glu 101, BUN 15, SCr 1.40, GFR 48  Wt Readings from Last 3 Encounters:  07/29/20 158 lb 9.6 oz (71.9 kg)  07/22/20 160 lb (72.6 kg)  07/15/20 160 lb (72.6 kg)   BP Readings from Last 3 Encounters:  07/29/20 116/78  07/22/20 107/72  07/15/20 (!) 144/89   Pulse Readings from Last 3 Encounters:  07/29/20 73  07/22/20 67  07/15/20 92    Current Outpatient Medications  Medication Sig Dispense Refill  . aspirin EC 81 MG tablet Take 81 mg by mouth daily.    . B Complex-C (B-COMPLEX WITH VITAMIN C) tablet Take 1 tablet by mouth daily.    . brimonidine (ALPHAGAN) 0.2 % ophthalmic solution Place 1 drop into both eyes at bedtime.     . carvedilol (COREG) 25 MG tablet Take 1 tablet (25 mg total) by mouth 2 (two) times daily. 180 tablet 3  . fluticasone (FLONASE) 50 MCG/ACT nasal spray Place 2 sprays into both nostrils daily. (Patient taking differently: Place 2 sprays into both nostrils at bedtime. ) 16 g 11  . furosemide (LASIX) 40 MG tablet Take 1 tablet (40 mg total) by mouth daily. 90 tablet 3  . latanoprost (XALATAN) 0.005 % ophthalmic solution Place 1 drop into both eyes at bedtime.     Marland Kitchen levothyroxine (SYNTHROID) 75 MCG tablet Take 1 tablet (75 mcg total) by mouth daily. (Patient taking differently: Take 75 mcg by mouth daily before breakfast. ) 90 tablet 3  .  Melatonin 10 MG TABS Take 10 mg by mouth at bedtime.     . Multiple Vitamin (MULTIVITAMIN WITH MINERALS) TABS tablet Take 1 tablet by mouth daily.    . sacubitril-valsartan (ENTRESTO) 24-26 MG Take 1 tablet by mouth 2 (two) times daily. 30 tablet 0  . sodium fluoride (FLUORISHIELD) 1.1 % GEL dental gel Instill one drop of gel per tooth space of fluoride tray. Place over teeth for 5 minutes. Remove. Spit out excess. Repeat nightly. (Patient taking differently: Place 1 application onto teeth at bedtime. Instill one drop of gel per tooth space of fluoride tray. Place over teeth for 5 minutes. Remove. Spit out excess. Repeat  nightly.) 120 mL prn  . tamsulosin (FLOMAX) 0.4 MG CAPS capsule Take 1 capsule by mouth once daily (Patient taking differently: Take 0.4 mg by mouth at bedtime. ) 90 capsule 0   No current facility-administered medications for this visit.    No Known Allergies  Past Medical History:  Diagnosis Date  . ARTHRITIS 10/22/2008  . BPH (benign prostatic hyperplasia)   . CHF (congestive heart failure) [facility])    sees Nicholas. Peter Wilkerson   . Dilated cardiomyopathy ([place])   . DIVERTICULOSIS, COLON 12/08/2007  . Glaucoma    sees Nicholas. Heather [place]   . H/O asbestos exposure   . History of echocardiogram 05/22/2007   EF was 45-50% / Mild concentric LV hypertrophy with mild global hypokinesis and overall mild systolic dysfunction .  Mild AV sclerosis / Mild Mitral insufficiency / compared to prior study 04/24/02 -- LV function has improved further.    . History of kidney stones   . Hypercholesterolemia   . Hypertension   . Insomnia 06/11/2015  . Radiation 01/01/15-02/18/15   base of tongue and bilateral neck 70 Gy  . Skin cancer    squamous cell, basal cell  . Squamous cell carcinoma 11/20/14   base of tongue primary  . Throat cancer ([place]) 10/2014   had chemo    Blood pressure 116/78, pulse 73, resp. rate 15, height 5\' 8"  (1.727 m), weight 158 lb 9.6 oz (71.9 kg), SpO2 99 %.  Acute on chronic systolic CHF (congestive heart failure) ([place]) Patient with HFrEF, now down to 20-25% by echo done this month.  Today will increase the Entresto to 49/51 mg twice daily.  He was asked to take his BP once daily for the next 2 weeks and return at that time for follow up.  He was given paperwork to see if he would qualify for [facility] patient assistance.  Also gave patient assistance paperwork for jardiance.  If that is approved, we can then start him on that as well.  We will see him back in 2 weeks for follow up.  Would like to add spironolactone if his BP tolerates this, but will need to repeat BMET at that visit before  making decision (SCr and K).     Tommy Medal PharmD CPP [facility] [address] [place], [place] [address] [phone]

## 2020-07-29 NOTE — Patient Instructions (Signed)
Return for a a follow up appointment November 2 at 10 am  Will have you do lab work when you come back in 2 weeks  Bring back the paperwork to see if we can get [name] and Jardiance at no charge from the manufacturers  Check your blood pressure at home daily and keep record of the readings.  Take your meds as follows:  Increase Entresto to 49/51 mg twice daily.   Continue with all other medications  Bring all of your meds, your BP cuff and your record of home blood pressures to your next appointment.  Exercise as youre able, try to walk approximately 30 minutes per day.  Keep salt intake to a minimum, especially watch canned and prepared boxed foods.  Eat more fresh fruits and vegetables and fewer canned items.  Avoid eating in fast food restaurants.    HOW TO TAKE YOUR BLOOD PRESSURE:  Rest 5 minutes before taking your blood pressure.   Dont smoke or drink caffeinated beverages for at least 30 minutes before.  Take your blood pressure before (not after) you eat.  Sit comfortably with your back supported and both feet on the floor (dont cross your legs).  Elevate your arm to heart level on a table or a desk.  Use the proper sized cuff. It should fit smoothly and snugly around your bare upper arm. There should be enough room to slip a fingertip under the cuff. The bottom edge of the cuff should be 1 inch above the crease of the elbow.  Ideally, take 3 measurements at one sitting and record the average.

## 2020-07-29 NOTE — Assessment & Plan Note (Signed)
Patient with HFrEF, now down to 20-25% by echo done this month.  Today will increase the Entresto to 49/51 mg twice daily.  He was asked to take his BP once daily for the next 2 weeks and return at that time for follow up.  He was given paperwork to see if he would qualify for [facility] patient assistance.  Also gave patient assistance paperwork for jardiance.  If that is approved, we can then start him on that as well.  We will see him back in 2 weeks for follow up.  Would like to add spironolactone if his BP tolerates this, but will need to repeat BMET at that visit before making decision (SCr and K).

## 2020-08-04 DIAGNOSIS — H401131 Primary open-angle glaucoma, bilateral, mild stage: Secondary | ICD-10-CM | POA: Diagnosis not present

## 2020-08-11 DIAGNOSIS — L570 Actinic keratosis: Secondary | ICD-10-CM | POA: Diagnosis not present

## 2020-08-11 DIAGNOSIS — Z85828 Personal history of other malignant neoplasm of skin: Secondary | ICD-10-CM | POA: Diagnosis not present

## 2020-08-11 DIAGNOSIS — D1801 Hemangioma of skin and subcutaneous tissue: Secondary | ICD-10-CM | POA: Diagnosis not present

## 2020-08-11 DIAGNOSIS — L821 Other seborrheic keratosis: Secondary | ICD-10-CM | POA: Diagnosis not present

## 2020-08-11 DIAGNOSIS — L720 Epidermal cyst: Secondary | ICD-10-CM | POA: Diagnosis not present

## 2020-08-12 ENCOUNTER — Other Ambulatory Visit: Payer: Self-pay

## 2020-08-12 ENCOUNTER — Ambulatory Visit (INDEPENDENT_AMBULATORY_CARE_PROVIDER_SITE_OTHER): Payer: PPO | Admitting: Pharmacist

## 2020-08-12 VITALS — BP 108/70 | HR 80 | Resp 16 | Ht 68.0 in | Wt 158.6 lb

## 2020-08-12 DIAGNOSIS — I428 Other cardiomyopathies: Secondary | ICD-10-CM | POA: Diagnosis not present

## 2020-08-12 DIAGNOSIS — Z79899 Other long term (current) drug therapy: Secondary | ICD-10-CM

## 2020-08-12 LAB — BASIC METABOLIC PANEL
BUN/Creatinine Ratio: 13 (ref 10–24)
BUN: 16 mg/dL (ref 8–27)
CO2: 24 mmol/L (ref 20–29)
Calcium: 9.3 mg/dL (ref 8.6–10.2)
Chloride: 101 mmol/L (ref 96–106)
Creatinine, Ser: 1.24 mg/dL (ref 0.76–1.27)
GFR calc Af Amer: 65 mL/min/{1.73_m2} (ref >59)
GFR calc non Af Amer: 56 mL/min/{1.73_m2} — ABNORMAL LOW (ref >59)
Glucose: 106 mg/dL — ABNORMAL HIGH (ref 65–99)
Potassium: 4 mmol/L (ref 3.5–5.2)
Sodium: 140 mmol/L (ref 134–144)

## 2020-08-12 MED ORDER — ENTRESTO 49-51 MG PO TABS: 1.0000 | ORAL_TABLET | Freq: Two times a day (BID) | ORAL | 1 refills | Status: DC

## 2020-08-12 NOTE — Progress Notes (Signed)
HPI:  Nicholas Wilkerson is a 79 y.o. male patient of Dr [name], with a Bradley below who presents today for heart failure medication titration.  He was seen by Dr. [name] on October 5, when his quinapril was switched to [facility] 24/26 mg. [name] was further titrated to 49/51mg  twice daily during OV on 07/29/2020. On Oct 12 he had a right/left heart cath which showed non-obstructive CAD, moderate pulmonary hypertension, low cardiac output and moderately elevated LV filling pressures.    Today he returns for Entresto titration and spironolactone initation.  He reports feeling "great".  Energy levels have increased and overall he feels that the [name] is having a positive effect.  He is a little concerned about cost, so we will keep this in mind when looking at adding an SGLT-2 inhibitor. [name] - both  $45/month $90/3 months until donut hole.  Unfortunately at that time he would then have costs of about $400 for each per 3 month supply.  This would be unabtainable for him.    Past Medical History: HFrEF 10/21 echo showed EF worsened to 20-25%, with evidence of pulmonary hypertension  Non-obstructive CAD 15-25% obstruction in RCA, LM, LCx  hyperlipidemia 10/21 - TC 184, TG 153, HDL 36, LDL 121  hypertension Controlled with Entresto, carvedilol  Cancer of tongue Squamous cell carcinoma of base of tongue/throat - treated with chemo/radiation (2016)     Blood Pressure Goal:  130/80  Current Medications:  Entresto 49/51mg  carvedilol 25 mg twice daily  Family Hx: father died from stroke at 62, mother from aneurysm in her 35s; 4 daughters all healthy  Social Hx: no tobacco, occasional alcohol  Home BP readings:  13 morning readings, average 109/68, (HR 68-79bpm) 4 evening readings, average 122/71, (HR 68-79bpm)   Intolerances: nkda  Labs: 07/15/20: Na 144, K 4.7, Glu 101, BUN 15, SCr 1.40, GFR 48  Wt Readings from Last 3 Encounters:  08/12/20 158 lb 9.6 oz (71.9 kg)  07/29/20  158 lb 9.6 oz (71.9 kg)  07/22/20 160 lb (72.6 kg)   BP Readings from Last 3 Encounters:  08/12/20 108/70  07/29/20 116/78  07/22/20 107/72   Pulse Readings from Last 3 Encounters:  08/12/20 80  07/29/20 73  07/22/20 67    Current Outpatient Medications  Medication Sig Dispense Refill   aspirin EC 81 MG tablet Take 81 mg by mouth daily.     B Complex-C (B-COMPLEX WITH VITAMIN C) tablet Take 1 tablet by mouth daily.     brimonidine (ALPHAGAN) 0.2 % ophthalmic solution Place 1 drop into both eyes at bedtime.      carvedilol (COREG) 25 MG tablet Take 1 tablet (25 mg total) by mouth 2 (two) times daily. 180 tablet 3   fluticasone (FLONASE) 50 MCG/ACT nasal spray Place 2 sprays into both nostrils daily. (Patient taking differently: Place 2 sprays into both nostrils at bedtime. ) 16 g 11   furosemide (LASIX) 40 MG tablet Take 1 tablet (40 mg total) by mouth daily. 90 tablet 3   latanoprost (XALATAN) 0.005 % ophthalmic solution Place 1 drop into both eyes at bedtime.      levothyroxine (SYNTHROID) 75 MCG tablet Take 1 tablet (75 mcg total) by mouth daily. (Patient taking differently: Take 75 mcg by mouth daily before breakfast. ) 90 tablet 3   Melatonin 10 MG TABS Take 10 mg by mouth at bedtime.      Multiple Vitamin (MULTIVITAMIN WITH MINERALS) TABS tablet Take 1 tablet by mouth  daily.     sodium fluoride (FLUORISHIELD) 1.1 % GEL dental gel Instill one drop of gel per tooth space of fluoride tray. Place over teeth for 5 minutes. Remove. Spit out excess. Repeat nightly. (Patient taking differently: Place 1 application onto teeth at bedtime. Instill one drop of gel per tooth space of fluoride tray. Place over teeth for 5 minutes. Remove. Spit out excess. Repeat nightly.) 120 mL prn   tamsulosin (FLOMAX) 0.4 MG CAPS capsule Take 1 capsule by mouth once daily (Patient taking differently: Take 0.4 mg by mouth at bedtime. ) 90 capsule 0   sacubitril-valsartan (ENTRESTO) 49-51 MG Take 1  tablet by mouth 2 (two) times daily. 60 tablet 1   No current facility-administered medications for this visit.    No Known Allergies  Past Medical History:  Diagnosis Date   ARTHRITIS 10/22/2008   BPH (benign prostatic hyperplasia)    CHF (congestive heart failure) ([place])    sees Dr. Peter [name]    Dilated cardiomyopathy [facility])    DIVERTICULOSIS, COLON 12/08/2007   Glaucoma    sees Dr. Heather [place]    H/O asbestos exposure    History of echocardiogram 05/22/2007   EF was 45-50% / Mild concentric LV hypertrophy with mild global hypokinesis and overall mild systolic dysfunction .  Mild AV sclerosis / Mild Mitral insufficiency / compared to prior study 04/24/02 -- LV function has improved further.     History of kidney stones    Hypercholesterolemia    Hypertension    Insomnia 06/11/2015   Radiation 01/01/15-02/18/15   base of tongue and bilateral neck 70 Gy   Skin cancer    squamous cell, basal cell   Squamous cell carcinoma 11/20/14   base of tongue primary   Throat cancer ([place]) 10/2014   had chemo    Blood pressure 108/70, pulse 80, resp. rate 16, height 5\' 8"  (1.727 m), weight 158 lb 9.6 oz (71.9 kg), SpO2 97 %.  Nonischemic cardiomyopathy ([place]) Blood pressure at desired goal, but limiting factor for further Entresto titration. Will continue current Entresto dose of 49/51mg  twice daily, continue current dose of carvedilol, and repeat BMET before adding spironolactone to therapy.   30day free card for [name] was provided, and paperwork for patient assistance given to Dr Doug Sou nurse for completion. Patient already scheduled for 3 weeks follow up with Dr [name].   [name] PharmD, BCPS, [facility] [address] [place] [address] 08/14/2020 5:00 PM

## 2020-08-12 NOTE — Patient Instructions (Addendum)
Return for a  follow up appointment Nov/24/2021 with Dr [name]  Go to the lab TODAY  Check your blood pressure at home daily (if able) and keep record of the readings.  Take your BP meds as follows: *NO CHANGE*    Bring all of your meds, your BP cuff and your record of home blood pressures to your next appointment.  Exercise as you're able, try to walk approximately 30 minutes per day.  Keep salt intake to a minimum, especially watch canned and prepared boxed foods.  Eat more fresh fruits and vegetables and fewer canned items.  Avoid eating in fast food restaurants.    HOW TO TAKE YOUR BLOOD PRESSURE: . Rest 5 minutes before taking your blood pressure. .  Don't smoke or drink caffeinated beverages for at least 30 minutes before. . Take your blood pressure before (not after) you eat. . Sit comfortably with your back supported and both feet on the floor (don't cross your legs). . Elevate your arm to heart level on a table or a desk. . Use the proper sized cuff. It should fit smoothly and snugly around your bare upper arm. There should be enough room to slip a fingertip under the cuff. The bottom edge of the cuff should be 1 inch above the crease of the elbow. . Ideally, take 3 measurements at one sitting and record the average.

## 2020-08-14 ENCOUNTER — Encounter: Payer: Self-pay | Admitting: Pharmacist

## 2020-08-14 NOTE — Assessment & Plan Note (Signed)
Blood pressure at desired goal, but limiting factor for further Entresto titration. Will continue current Entresto dose of 49/51mg  twice daily, continue current dose of carvedilol, and repeat BMET before adding spironolactone to therapy.   30day free card for [name] was provided, and paperwork for patient assistance given to Dr Doug Sou nurse for completion. Patient already scheduled for 3 weeks follow up with Dr [name].

## 2020-08-15 ENCOUNTER — Telehealth: Payer: Self-pay | Admitting: Cardiology

## 2020-08-15 ENCOUNTER — Other Ambulatory Visit: Payer: Self-pay

## 2020-08-15 MED ORDER — ENTRESTO 49-51 MG PO TABS: 1.0000 | ORAL_TABLET | Freq: Two times a day (BID) | ORAL | 3 refills | Status: DC

## 2020-08-15 NOTE — Telephone Encounter (Signed)
Patient says Dr. [name] was supposed to send a prescription of sacubitril-valsartan (ENTRESTO) 49-51 MG  so that the patient could get 30 days free. Patient would like a call in regards to this refill. Please call.    *STAT* If patient is at the pharmacy, call can be transferred to refill team.   1. Which medications need to be refilled? (please list name of each medication and dose if known)   sacubitril-valsartan (ENTRESTO) 49-51 MG     2. Which pharmacy/location (including street and city if local pharmacy) is medication to be sent to? [facility] ([place] [place] [address]   3. Do they need a 30 day or 90 day supply?  30 days

## 2020-08-19 ENCOUNTER — Telehealth: Payer: Self-pay

## 2020-08-19 NOTE — Telephone Encounter (Signed)
**Note De-Identified Nicholas Wilkerson Obfuscation** I started a Entresto PA through covermymeds: Key: [id number]

## 2020-08-19 NOTE — Telephone Encounter (Signed)
**Note De-Identified Jazelyn Sipe Obfuscation** Following message recieved from Covermymeds: Irven Shelling Key: [id number] - PA Case ID: [id number] Outcome:  Approved today PA Case: [id number], Status: Approved Coverage Starts on: 08/19/2020 12:00:00 AM, Coverage Ends on: 08/19/2021 12:00:00 AM. Drug: [name] 49-51MG  tablets Form [facility] [employer] 4-Part Electronic PA Form (2017 NCPDP)   I have notified Herbie Baltimore at [employer] of this approval.

## 2020-08-21 ENCOUNTER — Other Ambulatory Visit: Payer: Self-pay

## 2020-08-21 ENCOUNTER — Telehealth: Payer: Self-pay

## 2020-08-21 MED ORDER — EMPAGLIFLOZIN 10 MG PO TABS: 10.0000 mg | ORAL_TABLET | Freq: Every day | ORAL | 3 refills | Status: DC

## 2020-08-21 MED ORDER — ENTRESTO 49-51 MG PO TABS: 1.0000 | ORAL_TABLET | Freq: Two times a day (BID) | ORAL | 3 refills | Status: DC

## 2020-08-21 NOTE — Telephone Encounter (Signed)
Patient assistance forms for Entresto completed and faxed to [employer] patient assistance # [phone].  Patient assistance forms for Jardiance completed and faxed to [employer] patient assistance # [phone].

## 2020-08-31 NOTE — Progress Notes (Signed)
Nicholas Wilkerson Date of Birth: [birth year]   History of Present Illness: Nicholas Wilkerson is seen for followup. He has a history of dilated cardiomyopathy with initial ejection fraction of 30% over 18 years ago. Subsequent evaluation has demonstrated improvement to 50-55%. Last Echo was in January 2018 showing decrease in EF to 35-40%. ACEi was resumed at that time. . 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 He was treated with RT and chemo.   Last November he complained of some palpitations. Event monitor short short bursts of SVT and NSVT. No bradycardia or pauses. We increased his Coreg dose at that time.  He was seen this past month and he noted he had extensive Urologic evaluation for an elevated PSA with no cancer found. He did complain of getting tired real easy with activity. Thinks the higher dose of Coreg has slowed him down. No edema. No chest pain. He does note some change in his breathing.  We updated an Echocardiogram which demonstrated significant decrease in LV function with  EF down to 20-25%. There was also evidence of significant pulmonary HTN.   We performed cardiac cath showing nonobstructive CAD. Moderately elevated LV filling pressures. Moderate pulmonary HTN.  He was started on Lasix 40 mg daily. Continued Coreg and Jardiance. Added Entresto. Now up to 49/51 mg bid. He is feeling very well. Still active walking. Shot his age in golf this past week. No dyspnea, dizziness, palpitations.   Current Outpatient Medications on File Prior to Visit  Medication Sig Dispense Refill   aspirin EC 81 MG tablet Take 81 mg by mouth daily.     B Complex-C (B-COMPLEX WITH VITAMIN C) tablet Take 1 tablet by mouth daily.     brimonidine (ALPHAGAN) 0.2 % ophthalmic solution Place 1 drop into both eyes at bedtime.      carvedilol (COREG) 25 MG tablet Take 1 tablet (25 mg total) by mouth 2 (two) times daily. 180 tablet 3   fluticasone (FLONASE) 50 MCG/ACT nasal spray Place 2  sprays into both nostrils daily. (Patient taking differently: Place 2 sprays into both nostrils at bedtime. ) 16 g 11   furosemide (LASIX) 40 MG tablet Take 1 tablet (40 mg total) by mouth daily. 90 tablet 3   latanoprost (XALATAN) 0.005 % ophthalmic solution Place 1 drop into both eyes at bedtime.      levothyroxine (SYNTHROID) 75 MCG tablet Take 1 tablet (75 mcg total) by mouth daily. (Patient taking differently: Take 75 mcg by mouth daily before breakfast. ) 90 tablet 3   Melatonin 10 MG TABS Take 10 mg by mouth at bedtime.      Multiple Vitamin (MULTIVITAMIN WITH MINERALS) TABS tablet Take 1 tablet by mouth daily.     sacubitril-valsartan (ENTRESTO) 49-51 MG Take 1 tablet by mouth 2 (two) times daily. 180 tablet 3   sodium fluoride (FLUORISHIELD) 1.1 % GEL dental gel Instill one drop of gel per tooth space of fluoride tray. Place over teeth for 5 minutes. Remove. Spit out excess. Repeat nightly. (Patient taking differently: Place 1 application onto teeth at bedtime. Instill one drop of gel per tooth space of fluoride tray. Place over teeth for 5 minutes. Remove. Spit out excess. Repeat nightly.) 120 mL prn   tamsulosin (FLOMAX) 0.4 MG CAPS capsule Take 1 capsule by mouth once daily (Patient taking differently: Take 0.4 mg by mouth at bedtime. ) 90 capsule 0   No current facility-administered medications on file prior to visit.  No Known Allergies  Past Medical History:  Diagnosis Date   ARTHRITIS 10/22/2008   BPH (benign prostatic hyperplasia)    CHF (congestive heart failure) ([place])    sees Dr. Lizzeth Meder [name]    Dilated cardiomyopathy [facility])    DIVERTICULOSIS, COLON 12/08/2007   Glaucoma    sees Dr. Heather [place]    H/O asbestos exposure    History of echocardiogram 05/22/2007   EF was 45-50% / Mild concentric LV hypertrophy with mild global hypokinesis and overall mild systolic dysfunction .  Mild AV sclerosis / Mild Mitral insufficiency / compared to prior study 04/24/02  -- LV function has improved further.     History of kidney stones    Hypercholesterolemia    Hypertension    Insomnia 06/11/2015   Radiation 01/01/15-02/18/15   base of tongue and bilateral neck 70 Gy   Skin cancer    squamous cell, basal cell   Squamous cell carcinoma 11/20/14   base of tongue primary   Throat cancer ([place]) 10/2014   had chemo    Past Surgical History:  Procedure Laterality Date   CARDIAC CATHETERIZATION  10/17/2001   EF estimated at 30% / moderate LV enlargement  / 1. Minimal nonobstructive atherosclerotic coronary artery disease / 2. Severe LV dysfunction with global hypokinesia consistent with dilated nonischemic cardiomyopath / 3. Moderate pulmonary hypertension   COLONOSCOPY  06/23/2017   per Dr. Carlean Purl, clear, no repeats needed    LYMPH NODE BIOPSY     RIGHT/LEFT HEART CATH AND CORONARY ANGIOGRAPHY N/A 07/22/2020   Procedure: RIGHT/LEFT HEART CATH AND CORONARY ANGIOGRAPHY;  Surgeon: [name], Merik Mignano M, MD;  Location: [facility];  Service: Cardiovascular;  Laterality: N/A;    Social History   Tobacco Use  Smoking Status Never Smoker  Smokeless Tobacco Never Used    Social History   Substance and Sexual Activity  Alcohol Use Yes   Alcohol/week: 0.0 standard drinks   Comment: occasional beer, a couple times a month    Family History  Problem Relation Age of Onset   Stroke Father    Cancer Father        kidney ca   Angina Mother    Colon cancer Neg Hx    Esophageal cancer Neg Hx    Rectal cancer Neg Hx    Stomach cancer Neg Hx     Review of Systems:  All other systems were reviewed and are negative.  Physical Exam: BP (!) 104/56 (BP Location: Left Arm, Patient Position: Sitting, Cuff Size: Normal)    Pulse 60    Ht 5\' 8"  (1.727 m)    Wt 157 lb (71.2 kg)    BMI 23.87 kg/m  GENERAL:  Well appearing WM in NAD HEENT:  PERRL, EOMI, sclera are clear. Oropharynx is clear. NECK:  No jugular venous distention, carotid  upstroke brisk and symmetric, no bruits, no thyromegaly or adenopathy LUNGS:  Clear to auscultation bilaterally CHEST:  Unremarkable HEART:  RRR with extrasystoles,  PMI not displaced or sustained,S1 and S2 within normal limits, no S3, no S4: no clicks, no rubs, no murmurs ABD:  Soft, nontender. BS +, no masses or bruits. No hepatomegaly, no splenomegaly EXT:  2 + pulses throughout, no edema, no cyanosis no clubbing SKIN:  Warm and dry.  No rashes NEURO:  Alert and oriented x 3. Cranial nerves II through XII intact. PSYCH:  Cognitively intact      LABORATORY DATA:  Lab Results  Component Value Date   WBC  6.4 07/15/2020   HGB 18.0 (H) 07/22/2020   HCT 53.0 (H) 07/22/2020   PLT 208 07/15/2020   GLUCOSE 106 (H) 08/12/2020   CHOL 184 07/15/2020   TRIG 153 (H) 07/15/2020   HDL 36 (L) 07/15/2020   LDLCALC 121 (H) 07/15/2020   ALT 21 07/15/2020   AST 20 07/15/2020   NA 140 08/12/2020   K 4.0 08/12/2020   CL 101 08/12/2020   CREATININE 1.24 08/12/2020   BUN 16 08/12/2020   CO2 24 08/12/2020   TSH 1.45 05/27/2020   PSA 7.93 (H) 10/15/2019   INR 0.95 07/17/2015   HGBA1C 5.5 05/27/2020     Echo 11/02/16: Study Conclusions  - Left ventricle: The cavity size was mildly dilated. Wall   thickness was normal. Systolic function was moderately reduced.   The estimated ejection fraction was in the range of 35% to 40%.   Diffuse hypokinesis. Echogenic linear structure at the apex,   suggestive of calcified apical false tendon. Doppler parameters   are consistent with pseudonormal left ventricular relaxation   (grade 2 diastolic dysfunction). The E/e&' ratio is >15,   suggesting elevated LV filling pressure. - Mitral valve: Mildly thickened leaflets . There was mild   regurgitation. - Left atrium: The atrium was mildly dilated. - Inferior vena cava: The vessel was dilated. The respirophasic   diameter changes were blunted (< 50%), consistent with elevated   central venous  pressure.  Impressions:  - Compared to a prior study in 2012, the LVEF is lower at 35-40%.   The LV is mildly dilated and globally hypokinetic. LV filling   pressure appears elevated with grade 2 DD.  Echo 07/14/20: 1. Left ventricular ejection fraction, by estimation, is 20 to 25%. The left ventricle has severely decreased function. The left ventricle demonstrates global hypokinesis. The left ventricular internal cavity size was mildly dilated. Left ventricular diastolic parameters are consistent with Grade III diastolic dysfunction (restrictive). Elevated left ventricular end-diastolic pressure. 2. Right ventricular systolic function is normal. The right ventricular size is normal. There is severely elevated pulmonary artery systolic pressure. 3. Left atrial size was severely dilated. 4. The mitral valve is normal in structure. Mild mitral valve regurgitation. No evidence of mitral stenosis. 5. The aortic valve is tricuspid. There is mild calcification of the aortic valve. There is mild thickening of the aortic valve. Aortic valve regurgitation is not visualized. No aortic stenosis is present. 6. The inferior vena cava is normal in size with <50% respiratory variability, suggesting right atrial pressure of 8 mmHg.  Cardiac cath 07/22/20:  RIGHT/LEFT HEART CATH AND CORONARY ANGIOGRAPHY  Conclusion    Prox LAD to Mid LAD lesion is 25% stenosed.  Prox Cx to Dist Cx lesion is 25% stenosed.  Prox RCA to Mid RCA lesion is 15% stenosed.  LV end diastolic pressure is moderately elevated.  Hemodynamic findings consistent with moderate pulmonary hypertension.   1. Nonobstructive CAD 2. Low cardiac output. Index 1.86 3. Moderately elevated LV filling pressures 4. Moderate Pulmonary HTN  Plan: optimize medical therapy for CHF. On Coreg. Just started [place]. Will add lasix 40 mg daily. Further titration of medication depending on initial response.    Event monitor 10/03/19:  Study Highlights   Normal sinus rhythm  Occasional runs of NSVT longest lasting 7 beats.  Infrequent runs of SVT longest 9 beats.  No significant bradycardia or pauses  Average HR 71 bpm     Assessment / Plan:  1. Dilated cardiomyopathy with EF 35-40%  in 2018.   Echo shows significant decline in LV function with EF 20-25%.  Symptoms have improved with change in medication. Will continue Coreg at 25 mg bid. Now on Entresto 49/51 mg bid. Will now add aldactone 25 mg daily and repeat BMET early next week.   Will continue close follow up with Pharm D to titrate CHF medication. Unfortunately he did not qualify for patient assistance with Entresto. Could consider an SGLT2 inhibitor but cost will be a factor. Will anticipate repeating Echo once medical therapy optimized.   2. Hypertension well controlled.  3. PVCs/brief SVT/NSVT.  Continue carvedilol but at lower dose 25 mg bid.   4. SSCA of the tongue. S/p RT and chemo.

## 2020-09-03 ENCOUNTER — Ambulatory Visit: Payer: PPO | Admitting: Cardiology

## 2020-09-03 ENCOUNTER — Telehealth: Payer: Self-pay | Admitting: Cardiology

## 2020-09-03 ENCOUNTER — Other Ambulatory Visit: Payer: Self-pay

## 2020-09-03 ENCOUNTER — Encounter: Payer: Self-pay | Admitting: Cardiology

## 2020-09-03 VITALS — BP 104/56 | HR 60 | Ht 68.0 in | Wt 157.0 lb

## 2020-09-03 DIAGNOSIS — I5043 Acute on chronic combined systolic (congestive) and diastolic (congestive) heart failure: Secondary | ICD-10-CM

## 2020-09-03 DIAGNOSIS — I493 Ventricular premature depolarization: Secondary | ICD-10-CM

## 2020-09-03 DIAGNOSIS — I428 Other cardiomyopathies: Secondary | ICD-10-CM

## 2020-09-03 MED ORDER — ENTRESTO 49-51 MG PO TABS: 1.0000 | ORAL_TABLET | Freq: Two times a day (BID) | ORAL | 3 refills | Status: DC

## 2020-09-03 MED ORDER — SPIRONOLACTONE 25 MG PO TABS: 25.0000 mg | ORAL_TABLET | Freq: Every day | ORAL | 3 refills | Status: DC

## 2020-09-03 NOTE — Telephone Encounter (Signed)
*  STAT* If patient is at the pharmacy, call can be transferred to refill team. Patient starting with new pharmacy because it is cheaper and needs prescription sent to them.   1. Which medications need to be refilled? (please list name of each medication and dose if known) sacubitril-valsartan (ENTRESTO) 49-51 MG  2. Which pharmacy/location (including street and city if local pharmacy) is medication to be sent to? [facility]  Fax: [phone] Phone number: [phone]  3. Do they need a 30 day or 90 day supply?  90 day

## 2020-09-03 NOTE — Telephone Encounter (Signed)
Confirmed pharmacy with patient. Refill request sent to [facility] order pharmacy.

## 2020-09-03 NOTE — Patient Instructions (Signed)
Medication Instructions:  Start Aldactone 25 mg daily Continue all other medications *If you need a refill on your cardiac medications before your next appointment, please call your pharmacy*   Lab Work: Bmet  Have done Wed 09/10/20    Testing/Procedures: None ordered   Follow-Up: At [facility], you and your health needs are our priority.  As part of our continuing mission to provide you with exceptional heart care, we have created designated Provider Care Teams.  These Care Teams include your primary Cardiologist (physician) and Advanced Practice Providers (APPs -  Physician Assistants and Nurse Practitioners) who all work together to provide you with the care you need, when you need it.  We recommend signing up for the patient portal called "MyChart".  Sign up information is provided on this After Visit Summary.  MyChart is used to connect with patients for Virtual Visits (Telemedicine).  Patients are able to view lab/test results, encounter notes, upcoming appointments, etc.  Non-urgent messages can be sent to your provider as well.   To learn more about what you can do with MyChart, go to [email or web address].    Your next appointment:  3 months   The format for your next appointment: Office   Provider:  Dr.Jordan   Schedule appointment with Pharmacist in 1 month

## 2020-09-08 ENCOUNTER — Telehealth: Payer: Self-pay | Admitting: Cardiology

## 2020-09-08 NOTE — Telephone Encounter (Signed)
Both spironolactone and Entresto are first line therapies for heart failure. Pharmacy should not be denying spironolactone fill, especially when both rx are coming from the same provider. Please let pharmacy know we are aware of appropriate monitoring requirements. Pt has BMET already placed which will need to be checked 1 week after starting spironolactone.        Electronically signed by Leeroy Bock, RPH-CPP at 09/08/2020 11:10 AM   Called the patient and made him aware of the above from PharmD. Called patients pharmacy- [facility] on [address] in [place]. Pharmacist is aware that we would like both prescriptions to be filled.   Patient is aware to come in for his lab work one week after starting the Spironolactone. He is aware to call back with any new concerns.

## 2020-09-08 NOTE — Telephone Encounter (Signed)
Patient states that he could not pick up his Spironolactone Rx from his pharmacy because he was told there is a medication interaction issue. He thinks that the medication interaction was with [name].   Patient wanted to let Dr. [name] know.  Will route to PharmD as well for advisement.

## 2020-09-08 NOTE — Telephone Encounter (Signed)
Both spironolactone and Entresto are first line therapies for heart failure. Pharmacy should not be denying spironolactone fill, especially when both rx are coming from the same provider. Please let pharmacy know we are aware of appropriate monitoring requirements. Pt has BMET already placed which will need to be checked 1 week after starting spironolactone.

## 2020-09-08 NOTE — Telephone Encounter (Signed)
Pt c/o medication issue:  1. Name of Medication: spironolactone (ALDACTONE) 25 MG tablet [[id number]]    2. How are you currently taking this medication (dosage and times per day)? Take 1 tablet (25 mg total) by mouth daily.  3. Are you having a reaction (difficulty breathing--STAT)? no  4. What is your medication issue? Pt called and stated that the pharmacy stated there is a med interaction with this med.  He is suppose to come in for lab work after taken this med but he wanted dr to know that he was not able to pick it up to start it   Best number [phone]

## 2020-09-11 ENCOUNTER — Ambulatory Visit: Payer: PPO

## 2020-09-11 DIAGNOSIS — I5022 Chronic systolic (congestive) heart failure: Secondary | ICD-10-CM

## 2020-09-11 DIAGNOSIS — I10 Essential (primary) hypertension: Secondary | ICD-10-CM

## 2020-09-11 NOTE — Chronic Care Management (AMB) (Signed)
Chronic Care Management Pharmacy  Name: Nicholas Wilkerson  MRN: [id number] DOB: 06/19/1941  Initial Questions: 1. Have you seen any other providers since your last visit? Yes  2. Any changes in your medicines or health? Yes   Chief Complaint/ HPI Nicholas Wilkerson,  79 y.o. , male presents for their Follow-Up CCM visit with the clinical pharmacist via telephone due to COVID-19 Pandemic.  PCP : Laurey Morale, MD  Their chronic conditions include: HTN, CHF, Arthritis, Hypercholesterolemia, Insomnia, Hypothyroidism, Glaucoma,  Prostate   Office Visits: 05/27/20 Alysia Penna, MD: Patient presented for follow up. All labs WNL. Z-pack prescribed.   05/26/20 Ofilia Neas, LPN: Patient presented for medicare annual wellness exam.   10/15/2019- Patient presented for wellness exam. Diet and exercise was discussed. Patient to obtain fasting labs.   Consult Visit: 09/03/20 Peter [name], MD (Cardiology): Patient presented for HF follow up. Jardiance d/c'd and spironolactone started. Plan for BMET in 1 week and follow up with pharmacist in 1 month.  08/12/20 Raquel Rodriguez-Guzman, RPH-CPP (cardiology): Patient presented for medication management. Received patient assistance application for Entresto.  07/29/20 Elder Cyphers, RPH-CPP (cardiology): Patient presented for medication management. Entresto increased to 49/51 mg BID.  07/22/20 Patient admitted for heart catheterization and coronary angiography.  07/15/20 Peter [name], MD (Cardiology): Patient presented for HF follow up. Carvedilol increased to 25 mg BID and Entresto 24/26 mg BID prescribed to replace quinapril.   06/09/20 Raynelle Bring (urology): Patient presented for follow up. Unable to access notes.  04/08/2020- Dentistry- Patient presented to [facility] Dr. Aline August for tooth restoration.   04/02/2020- Optometry- Patient presented to Dr. Heather [place] for primary open- angle glaucoma. (unable to see notes).   02/28/20 Heath Lark, MD  (oncology): Patient presented for follow up for tongue cancer. Discharged him from care.   Medications: Outpatient Encounter Medications as of 09/11/2020  Medication Sig  . aspirin EC 81 MG tablet Take 81 mg by mouth daily.  . B Complex-C (B-COMPLEX WITH VITAMIN C) tablet Take 1 tablet by mouth daily.  . brimonidine (ALPHAGAN) 0.2 % ophthalmic solution Place 1 drop into both eyes at bedtime.   . carvedilol (COREG) 25 MG tablet Take 1 tablet (25 mg total) by mouth 2 (two) times daily.  . fluticasone (FLONASE) 50 MCG/ACT nasal spray Place 2 sprays into both nostrils daily. (Patient taking differently: Place 2 sprays into both nostrils at bedtime. )  . furosemide (LASIX) 40 MG tablet Take 1 tablet (40 mg total) by mouth daily.  Marland Kitchen latanoprost (XALATAN) 0.005 % ophthalmic solution Place 1 drop into both eyes at bedtime.   Marland Kitchen levothyroxine (SYNTHROID) 75 MCG tablet Take 1 tablet (75 mcg total) by mouth daily. (Patient taking differently: Take 75 mcg by mouth daily before breakfast. )  . Melatonin 10 MG TABS Take 10 mg by mouth at bedtime.   . Multiple Vitamin (MULTIVITAMIN WITH MINERALS) TABS tablet Take 1 tablet by mouth daily.  . sodium fluoride (FLUORISHIELD) 1.1 % GEL dental gel Instill one drop of gel per tooth space of fluoride tray. Place over teeth for 5 minutes. Remove. Spit out excess. Repeat nightly. (Patient taking differently: Place 1 application onto teeth at bedtime. Instill one drop of gel per tooth space of fluoride tray. Place over teeth for 5 minutes. Remove. Spit out excess. Repeat nightly.)  . spironolactone (ALDACTONE) 25 MG tablet Take 1 tablet (25 mg total) by mouth daily.  . tamsulosin (FLOMAX) 0.4 MG CAPS capsule Take 1 capsule by mouth once  daily (Patient taking differently: Take 0.4 mg by mouth at bedtime. )  . [DISCONTINUED] sacubitril-valsartan (ENTRESTO) 49-51 MG Take 1 tablet by mouth 2 (two) times daily.   No facility-administered encounter medications on file as of  09/11/2020.   Patient currently reports his biggest concern with current medications is cost. He believes he was denied by [employer] for patient assistance but was unsure why. Upon [employer], his application was not complete. For Jardiance, he was denied patient assistance due to his income above 250% [employer].    Current Diagnosis/Assessment:  Goals Addressed            This Visit's Progress   . Pharmacy Care Plan       Current Barriers:  . Chronic Disease Management support, education, and care coordination needs related to CHF, HTN, and HLD, Insomnia, Hypothyroidism, Glaucoma, Prostate  Pharmacist Clinical Goal(s):  . High blood pressure:  o Maintain blood pressure within goal of your provider (130/80 or 140/90)  . Maintaining a low sodium diet with goal of less than 1500mg  per day . Continue working on lifestyle modifications (diet/exercise). . Insomnia: Continue to see improvement in insomnia. Marland Kitchen Heart failure: weigh yourself daily.  . High cholesterol:  o Cholesterol goals: Total Cholesterol goal under 200, Triglycerides goal under 150, HDL goal above 40 (men) or above 50 (women), LDL goal under 100.  Marland Kitchen Hypothyroidism: Maintain Vitamin D-25 level at or above 30.  . Arthritis: Minimize pain level.   Interventions: . Comprehensive medication review performed. Marland Kitchen Heart failure:  o If you gain more than 3 pounds in one day or 5 pounds in one week call your doctor . High blood pressure:  o  DASH diet:  following a diet emphasizing fruits and vegetables and low-fat dairy products along with whole grains, fish, poultry, and nuts. Reducing red meats and sugars.  o Reducing the amount of salt intake to 1500mg /per day. o Recommend using a salt substitute to replace your salt if you need flavor.  o Continue: Carvedilol 25mg . 1 tablet twice daily, Entresto 49/51 mg, 1 tablet twice daily  . High cholesterol:  o We discussed: to improve reduce cholesterol levels: focus on a diet high  in plant sterols (fruits/vegetables/nuts/whole grains/legumes) and increasing fiber to a daily intake of 10-25g/day.  o Continue: with diet modifications.  . Discussed lifestyle modifications including exercise. Continue engaging in at least 150 minutes per week of moderate-intensity exercise such as brisk walking (15- to 20-minute mile) or something similar. Recommended they incorporate flexibility, balance, and some type of strength training exercises.  Please begin slowly and build up gradually.  I discouraged prolonged sitting times.    . Hypothyroidism:  o Continue levothyroxine 39mcg, 1 tablet once daily.  . Insomnia:  o Discussed sleep hygiene for insomnia. (Avoid napping, limit exposure to technology near bedtime, etc). o Continue melatonin 10mg , 1 tablet daily.  . Prostate health:  o Continue tamsulosin 0.4mg , 1 capsule daily.  . Arthritis: consider taking Tylenol as needed for pain.  . Glaucoma:  o Continue eye drops (brimonidine (Alphagan) 0.2% and latanoprost (Xalatan) 0.005%).  Marland Kitchen Discussed importance of taking medications as directed.  . Discussed patient assistance for [name] and Jardiance  Patient Self Care Activities:  . Calls provider office for new concerns or questions . Continue current medications as directed by providers.  . Continue following up with specialists. . Continue at home blood pressure readings. . Continue engaging in at least 150 minutes per week of moderate-intensity exercise such as  brisk walking (15- to 20-minute mile) or something similar.   Please see past updates related to this goal by clicking on the "Past Updates" button in the selected goal        SDOH Interventions   Flowsheet Row Most Recent Value  SDOH Interventions   Financial Strain Interventions Other (Comment)  [working on patient assistance for Entresto]  Transportation Interventions Intervention Not Indicated      Heart Failure  Patient notes being diagnosed about 20 years  ago.   Last ejection fraction: 20-25% (07/2020)   NYHA Class: I (no actitivty limitation) AHA HF Stage: B (Heart disease present - no symptoms present)  Patient has failed these meds in past:  Patient is currently controlled on the following medications:  - Carvedilol 25mg , 1 tablet twice daily  - Entresto 49/51 mg, 1 tablet twice daily - Spironolactone 25 mg 1 tablet daily  We discussed diet and exercise extensively and weighing daily; if you gain more than 3 pounds in one day or 5 pounds in one week call your doctor  -Discussed patient assistance requirements for Entresto at length - patient and his wife have an income of roughly $3,530/month  -[employer] to follow up on [place] application and was not completed - faxed over copy of insurance card   Plan Will follow up on completion of Entresto patient assistance application. Will send message to CPP at cardiologist office about option for patient assistance for Farxiga as patient did not qualify for Jardiance. Continue current medications.    Hypertension   BP today is:  <130/80  Office blood pressures are  BP Readings from Last 3 Encounters:  09/03/20 (!) 104/56  08/12/20 108/70  07/29/20 116/78   Patient has failed these meds in the past: ramipril (low BP).    Patient checks BP at home several times per month  Patient home BP readings are ranging: 110/70 mmHg   Patient is controlled on:  - Carvedilol 25mg . 1 tablet twice daily  - Entresto 49/51 mg 1 tablet twice daily  We discussed diet and exercise extensively  -Feels a little lightheaded occasionally  -Exercise - patient plays golf and remains active throughout the day; has spent a lot of time decorating for Christmas lately  Plan Continue current medications.     Hyperlipidemia  LDL goal < 100  Lipid Panel     Component Value Date/Time   CHOL 184 07/15/2020 1648   TRIG 153 (H) 07/15/2020 1648   TRIG 126 08/25/2006 0850   HDL 36 (L) 07/15/2020  1648   CHOLHDL 5.1 (H) 07/15/2020 1648   CHOLHDL 5 10/15/2019 1058   VLDL 20.4 10/15/2019 1058   LDLCALC 121 (H) 07/15/2020 1648   LABVLDL 27 07/15/2020 1648    The 10-year ASCVD risk score Mikey Bussing DC Jr., et al., 2013) is: 24.7%   Values used to calculate the score:     Age: 6 years     Sex: Male     Is Non-Hispanic African American: No     Diabetic: No     Tobacco smoker: No     Systolic Blood Pressure: 315 mmHg     Is BP treated: Yes     HDL Cholesterol: 36 mg/dL     Total Cholesterol: 184 mg/dL  Patient has failed these meds in past: simvastatin (does not remember why he stopped it in the past).  Patient is currently uncontrolled on the following medications:  - no medications  We discussed:  diet and exercise  extensively  - diet: high in plant sterols (fruits/vegetables/nuts/whole grains/legumes) may reduce  cholesterol.  - Patient notes avoids fried food and aim to eat baked fish. Avoids junk food.   Plan Recommend restarting statin therapy. Will discuss with PCP.  Continue control with diet and exercise.   Aspirin therapy   Patient is currently managed on the following medications: - ASA 81mg , 1 tablet daily   The 10-year ASCVD risk score Mikey Bussing DC Jr., et al., 2013) is: 24.7%  Plan Continue current medications.   Hypothyroidism   TSH  Date Value Ref Range Status  05/27/2020 1.45 0.40 - 4.50 mIU/L Final     No components found for: T4  Patient is currently controlled on the following medications:  - levothyroxine 86mcg, 1 tablet once daily (recently started)   We discussed:  levothyroxine administration (in the morning on an empty stomach 30 minutes before breakfast).    Plan  Continue current medications.  Insomnia   Patient has failed these meds in past: none  Patient is currently controlled on the following medications: - melatonin 10mg , 1 tablet at night   We discussed:  non-pharmacological interventions for insomnia. (Avoid napping, limit  exposure to technology near bedtime, etc).  Plan Patient notes improvement in sleep.  Continue current medications.    Prostate health  Patient has failed these meds in past: none  Patient is currently managed on the following medications:  - Tamsulosin 0.4mg , 1 capsule daily   Plan Patient is managed by urologist. Patient had a recent elevated PSA. Plans to do a biopsy in a couple of months.  Continue current medications.   Arthritis   Patient has failed these meds in past: none  Patient is currently controlled on the following medications:  - currently managed on no medications   - diclofenac 75mg , 1 tablet twice daily (has not taken for years)   We discussed:  caution use of diclofenac is setting of CHF. Recommended intial trial of Tylenol for future use.   Plan Continue as is. Recommended Tylenol for future use.   Glaucoma   Patient has failed these meds in past: none  Patient is currently controlled on the following medications:  - Brimonidine (Alphagan) 0.2% - Latanoprost (Xalatan) 0.005%  Plan Sees Heather [place], Optometrist Continue current medications  Medication Management   Patient's preferred pharmacy is:  [facility] ([place]), [place] [address] [place] ([place]) [place] [address] Phone: [phone] Fax: [phone]  [facility] ([place], [place] [address] Phone: [phone] Fax: [phone]  Uses pill box? Yes - in a pill box and organizes every Sunday. Pt endorses 100% compliance  We discussed: Current pharmacy is preferred with insurance plan and patient is satisfied with pharmacy services  Plan  Continue current medication management strategy   Follow up: 4 month phone visit    [name], PharmD [place] Pharmacist [place] at [place] [phone]

## 2020-09-15 ENCOUNTER — Other Ambulatory Visit: Payer: Self-pay | Admitting: Cardiology

## 2020-09-15 MED ORDER — ENTRESTO 49-51 MG PO TABS: 1.0000 | ORAL_TABLET | Freq: Two times a day (BID) | ORAL | 3 refills | Status: DC

## 2020-09-15 NOTE — Telephone Encounter (Signed)
°*  STAT* If patient is at the pharmacy, call can be transferred to refill team.   1. Which medications need to be refilled? (please list name of each medication and dose if known) sacubitril-valsartan (ENTRESTO) 49-51 MG  2. Which pharmacy/location (including street and city if local pharmacy) is medication to be sent to? [facility] ([facility]), [place] - [address]  3. Do they need a 30 day or 90 day supply? Patient is requesting to have an emergency supply of medication sent to his local [place] to hold him over until [employer] order arrives.

## 2020-09-19 DIAGNOSIS — I5043 Acute on chronic combined systolic (congestive) and diastolic (congestive) heart failure: Secondary | ICD-10-CM | POA: Diagnosis not present

## 2020-09-19 DIAGNOSIS — I493 Ventricular premature depolarization: Secondary | ICD-10-CM | POA: Diagnosis not present

## 2020-09-19 DIAGNOSIS — I428 Other cardiomyopathies: Secondary | ICD-10-CM | POA: Diagnosis not present

## 2020-09-19 LAB — BASIC METABOLIC PANEL
BUN/Creatinine Ratio: 13 (ref 10–24)
BUN: 18 mg/dL (ref 8–27)
CO2: 24 mmol/L (ref 20–29)
Calcium: 9.1 mg/dL (ref 8.6–10.2)
Chloride: 101 mmol/L (ref 96–106)
Creatinine, Ser: 1.37 mg/dL — ABNORMAL HIGH (ref 0.76–1.27)
GFR calc Af Amer: 57 mL/min/{1.73_m2} — ABNORMAL LOW (ref >59)
GFR calc non Af Amer: 49 mL/min/{1.73_m2} — ABNORMAL LOW (ref >59)
Glucose: 72 mg/dL (ref 65–99)
Potassium: 4.2 mmol/L (ref 3.5–5.2)
Sodium: 139 mmol/L (ref 134–144)

## 2020-09-19 NOTE — Patient Instructions (Signed)
Hi Nicholas Wilkerson!  It has been lovely to chat with you and I am hoping to help you get everything straightened out with patient assistance! Please continue checking your blood pressure as directed, weighing yourself daily, and taking your medications as prescribed.   I will reach out to you once I know more or follow up with [employer], the company that makes [place]. After that, I will plan to touch base with you in about 4 months just to check in. Please give me a call or send me a message if you need anything in the meantime!  Best, Maddie  [name], PharmD [facility] Clinical Pharmacist [place] at [place]   Visit Information  Goals Addressed            This Visit's Progress   . Pharmacy Care Plan       Current Barriers:  . Chronic Disease Management support, education, and care coordination needs related to CHF, HTN, and HLD, Insomnia, Hypothyroidism, Glaucoma, Prostate  Pharmacist Clinical Goal(s):  . High blood pressure:  o Maintain blood pressure within goal of your provider (130/80 or 140/90)  . Maintaining a low sodium diet with goal of less than 1500mg  per day . Continue working on lifestyle modifications (diet/exercise). . Insomnia: Continue to see improvement in insomnia. Marland Kitchen Heart failure: weigh yourself daily.  . High cholesterol:  o Cholesterol goals: Total Cholesterol goal under 200, Triglycerides goal under 150, HDL goal above 40 (men) or above 50 (women), LDL goal under 100.  Marland Kitchen Hypothyroidism: Maintain Vitamin D-25 level at or above 30.  . Arthritis: Minimize pain level.   Interventions: . Comprehensive medication review performed. Marland Kitchen Heart failure:  o If you gain more than 3 pounds in one day or 5 pounds in one week call your doctor . High blood pressure:  o  DASH diet:  following a diet emphasizing fruits and vegetables and low-fat dairy products along with whole grains, fish, poultry, and nuts. Reducing red meats and sugars.  o Reducing  the amount of salt intake to 1500mg /per day. o Recommend using a salt substitute to replace your salt if you need flavor.  o Continue: Carvedilol 25mg . 1 tablet twice daily, Entresto 49/51 mg, 1 tablet twice daily  . High cholesterol:  o We discussed: to improve reduce cholesterol levels: focus on a diet high in plant sterols (fruits/vegetables/nuts/whole grains/legumes) and increasing fiber to a daily intake of 10-25g/day.  o Continue: with diet modifications.  . Discussed lifestyle modifications including exercise. Continue engaging in at least 150 minutes per week of moderate-intensity exercise such as brisk walking (15- to 20-minute mile) or something similar. Recommended they incorporate flexibility, balance, and some type of strength training exercises.  Please begin slowly and build up gradually.  I discouraged prolonged sitting times.    . Hypothyroidism:  o Continue levothyroxine 2mcg, 1 tablet once daily.  . Insomnia:  o Discussed sleep hygiene for insomnia. (Avoid napping, limit exposure to technology near bedtime, etc). o Continue melatonin 10mg , 1 tablet daily.  . Prostate health:  o Continue tamsulosin 0.4mg , 1 capsule daily.  . Arthritis: consider taking Tylenol as needed for pain.  . Glaucoma:  o Continue eye drops (brimonidine (Alphagan) 0.2% and latanoprost (Xalatan) 0.005%).  Marland Kitchen Discussed importance of taking medications as directed.  . Discussed patient assistance for [name] and Jardiance  Patient Self Care Activities:  . Calls provider office for new concerns or questions . Continue current medications as directed by providers.  . Continue following up  with specialists. . Continue at home blood pressure readings. . Continue engaging in at least 150 minutes per week of moderate-intensity exercise such as brisk walking (15- to 20-minute mile) or something similar.   Please see past updates related to this goal by clicking on the "Past Updates" button in the selected  goal         The patient verbalized understanding of instructions, educational materials, and care plan provided today and declined offer to receive copy of patient instructions, educational materials, and care plan.   Telephone follow up appointment with pharmacy team member scheduled for: 4 months  Viona Gilmore, [facility]

## 2020-09-21 ENCOUNTER — Other Ambulatory Visit: Payer: Self-pay | Admitting: Family Medicine

## 2020-09-23 ENCOUNTER — Other Ambulatory Visit: Payer: Self-pay

## 2020-09-23 DIAGNOSIS — Z79899 Other long term (current) drug therapy: Secondary | ICD-10-CM

## 2020-09-25 ENCOUNTER — Other Ambulatory Visit: Payer: Self-pay | Admitting: Family Medicine

## 2020-10-02 MED ORDER — ATORVASTATIN CALCIUM 40 MG PO TABS: 40.0000 mg | ORAL_TABLET | Freq: Every day | ORAL | 3 refills | Status: DC

## 2020-10-02 NOTE — Telephone Encounter (Signed)
-----   Message from Viona Gilmore, [facility] sent at 09/19/2020  3:28 PM EST ----- Regarding: Restarting a statin Hi,  As we discussed yesterday, I would recommend restarting moderate intensity statin therapy based on his 10-year ASCVD risk score of 24.7% and LDL of 121. He was previously on simvastatin and could not recall why it was stopped back in 2016.  I would be happy to call the patient to relay the plan!  Best, Maddie

## 2020-10-02 NOTE — Telephone Encounter (Signed)
I sent in Lipitor 40 mg daily

## 2020-10-06 ENCOUNTER — Telehealth: Payer: Self-pay

## 2020-10-06 NOTE — Telephone Encounter (Signed)
Received patient assistance from [employer] approved.Received patient assistance from [employer] Jardiance denied.

## 2020-10-07 ENCOUNTER — Ambulatory Visit (INDEPENDENT_AMBULATORY_CARE_PROVIDER_SITE_OTHER): Payer: PPO | Admitting: Pharmacist

## 2020-10-07 ENCOUNTER — Other Ambulatory Visit: Payer: Self-pay

## 2020-10-07 ENCOUNTER — Encounter: Payer: Self-pay | Admitting: Pharmacist

## 2020-10-07 VITALS — BP 104/72 | HR 72 | Resp 14 | Ht 68.0 in | Wt 157.2 lb

## 2020-10-07 DIAGNOSIS — Z79899 Other long term (current) drug therapy: Secondary | ICD-10-CM

## 2020-10-07 DIAGNOSIS — I420 Dilated cardiomyopathy: Secondary | ICD-10-CM

## 2020-10-07 NOTE — Progress Notes (Signed)
HPI:  Nicholas Wilkerson is a 79 y.o. male patient of Dr [place], with a PMH below who presents today for heart failure medication titration.  Spironolactone 25mg  daily was added to his therapy on 09/03/2020. Repeat BMET shows stable renal function and electrolites. Per Dr 09/05/2020 instructions, we will repeat BMET today. Entresto patient assistance was approved, patient notified. Fredrich Birks was denied for "income exceeds current program eligibility". Plan to apply for [employer] patient assistance today.   Past Medical History: HFrEF 10/21 echo showed EF worsened to 20-25%, with evidence of pulmonary hypertension  Non-obstructive CAD 15-25% obstruction in RCA, LM, LCx  hyperlipidemia 10/21 - TC 184, TG 153, HDL 36, LDL 121  hypertension Controlled with Entresto, carvedilol  Cancer of tongue Squamous cell carcinoma of base of tongue/throat - treated with chemo/radiation (2016)     Blood Pressure Goal:  130/80  Current Medications:  Entresto 49/51mg  carvedilol 25 mg twice daily Spironolactone 25mg  daily  Family Hx: father died from stroke at 46, mother from aneurysm in her 67s; 4 daughters all healthy  Social Hx: no tobacco, occasional alcohol  Home BP readings: none provided  Intolerances: nkda  Labs:  07/15/20: Na 144, K 4.7, Glu 101, BUN 15, SCr 1.40 09/19/20: Na 139, K 4.2, Glu 72, BUN 18, Scr 1.37  Wt Readings from Last 3 Encounters:  10/07/20 157 lb 3.2 oz (71.3 kg)  09/03/20 157 lb (71.2 kg)  08/12/20 158 lb 9.6 oz (71.9 kg)   BP Readings from Last 3 Encounters:  10/07/20 104/72  09/03/20 (!) 104/56  08/12/20 108/70   Pulse Readings from Last 3 Encounters:  10/07/20 72  09/03/20 60  08/12/20 80    Current Outpatient Medications  Medication Sig Dispense Refill  . aspirin EC 81 MG tablet Take 81 mg by mouth daily.    09/05/20 atorvastatin (LIPITOR) 40 MG tablet Take 1 tablet (40 mg total) by mouth daily. 90 tablet 3  . B Complex-C (B-COMPLEX WITH VITAMIN C) tablet Take 1  tablet by mouth daily.    . brimonidine (ALPHAGAN) 0.2 % ophthalmic solution Place 1 drop into both eyes at bedtime.     . carvedilol (COREG) 25 MG tablet Take 1 tablet (25 mg total) by mouth 2 (two) times daily. 180 tablet 3  . fluticasone (FLONASE) 50 MCG/ACT nasal spray Place 2 sprays into both nostrils daily. (Patient taking differently: Place 2 sprays into both nostrils at bedtime.) 16 g 11  . furosemide (LASIX) 40 MG tablet Take 1 tablet (40 mg total) by mouth daily. 90 tablet 3  . latanoprost (XALATAN) 0.005 % ophthalmic solution Place 1 drop into both eyes at bedtime.     13/02/21 levothyroxine (SYNTHROID) 75 MCG tablet Take 1 tablet by mouth once daily 90 tablet 0  . Melatonin 10 MG TABS Take 10 mg by mouth at bedtime.     . Multiple Vitamin (MULTIVITAMIN WITH MINERALS) TABS tablet Take 1 tablet by mouth daily.    . sacubitril-valsartan (ENTRESTO) 49-51 MG Take 1 tablet by mouth 2 (two) times daily. 180 tablet 3  . sodium fluoride (FLUORISHIELD) 1.1 % GEL dental gel Instill one drop of gel per tooth space of fluoride tray. Place over teeth for 5 minutes. Remove. Spit out excess. Repeat nightly. (Patient taking differently: Place 1 application onto teeth at bedtime. Instill one drop of gel per tooth space of fluoride tray. Place over teeth for 5 minutes. Remove. Spit out excess. Repeat nightly.) 120 mL prn  . spironolactone (ALDACTONE)  25 MG tablet Take 1 tablet (25 mg total) by mouth daily. 90 tablet 3  . tamsulosin (FLOMAX) 0.4 MG CAPS capsule Take 1 capsule by mouth once daily 90 capsule 0   No current facility-administered medications for this visit.    No Known Allergies  Past Medical History:  Diagnosis Date  . ARTHRITIS 10/22/2008  . BPH (benign prostatic hyperplasia)   . CHF (congestive heart failure) [facility])    sees Dr. Peter [place]   . Dilated cardiomyopathy (HCC)   . DIVERTICULOSIS, COLON 12/08/2007  . Glaucoma    sees Dr. Heather [place]   . H/O asbestos exposure   . History of  echocardiogram 05/22/2007   EF was 45-50% / Mild concentric LV hypertrophy with mild global hypokinesis and overall mild systolic dysfunction .  Mild AV sclerosis / Mild Mitral insufficiency / compared to prior study 04/24/02 -- LV function has improved further.    . History of kidney stones   . Hypercholesterolemia   . Hypertension   . Insomnia 06/11/2015  . Radiation 01/01/15-02/18/15   base of tongue and bilateral neck 70 Gy  . Skin cancer    squamous cell, basal cell  . Squamous cell carcinoma 11/20/14   base of tongue primary  . Throat cancer (HCC) 10/2014   had chemo    Blood pressure 104/72, pulse 72, resp. rate 14, height 5\' 8"  (1.727 m), weight 157 lb 3.2 oz (71.3 kg), SpO2 98 %.  Congestive dilated cardiomyopathy (HCC) Blood pressure appropriate for current therapy. Will repeat BMET today and consider changing furosemide to PRN if needed. Will continue Entresto, spironolactone and carvedilol as prescribed. Paperwork for completed and given to Dr [place] primary nurse Elvis Coil) to fax and follow up.    Jalin Alicea Rodriguez-Guzman PharmD, BCPS, CPP [facility] [address] [place] 10/07/2020 3:43 PM

## 2020-10-07 NOTE — Assessment & Plan Note (Signed)
Blood pressure appropriate for current therapy. Will repeat BMET today and consider changing furosemide to PRN if needed. Will continue Entresto, spironolactone and carvedilol as prescribed. Paperwork for [place] completed and given to Dr Elvis Coil primary nurse Anabel Halon) to fax and follow up.

## 2020-10-07 NOTE — Patient Instructions (Signed)
Return for a  follow up appointment AS NEEDED  Go to the lab in TODAY   Check your blood pressure at home daily (if able) and keep record of the readings.  Take your BP meds as follows: *NO CHANGES*  Bring all of your meds, your BP cuff and your record of home blood pressures to your next appointment.  Exercise as you're able, try to walk approximately 30 minutes per day.  Keep salt intake to a minimum, especially watch canned and prepared boxed foods.  Eat more fresh fruits and vegetables and fewer canned items.  Avoid eating in fast food restaurants.    HOW TO TAKE YOUR BLOOD PRESSURE: . Rest 5 minutes before taking your blood pressure. .  Don't smoke or drink caffeinated beverages for at least 30 minutes before. . Take your blood pressure before (not after) you eat. . Sit comfortably with your back supported and both feet on the floor (don't cross your legs). . Elevate your arm to heart level on a table or a desk. . Use the proper sized cuff. It should fit smoothly and snugly around your bare upper arm. There should be enough room to slip a fingertip under the cuff. The bottom edge of the cuff should be 1 inch above the crease of the elbow. . Ideally, take 3 measurements at one sitting and record the average.

## 2020-10-08 LAB — BASIC METABOLIC PANEL
BUN/Creatinine Ratio: 17 (ref 10–24)
BUN: 21 mg/dL (ref 8–27)
CO2: 27 mmol/L (ref 20–29)
Calcium: 9.7 mg/dL (ref 8.6–10.2)
Chloride: 102 mmol/L (ref 96–106)
Creatinine, Ser: 1.27 mg/dL (ref 0.76–1.27)
GFR calc Af Amer: 63 mL/min/{1.73_m2} (ref >59)
GFR calc non Af Amer: 54 mL/min/{1.73_m2} — ABNORMAL LOW (ref >59)
Glucose: 101 mg/dL — ABNORMAL HIGH (ref 65–99)
Potassium: 4.5 mmol/L (ref 3.5–5.2)
Sodium: 139 mmol/L (ref 134–144)

## 2020-10-13 ENCOUNTER — Other Ambulatory Visit: Payer: Self-pay

## 2020-10-13 MED ORDER — DAPAGLIFLOZIN PROPANEDIOL 10 MG PO TABS: 10.0000 mg | ORAL_TABLET | Freq: Every day | ORAL | 3 refills | Status: DC

## 2020-10-15 ENCOUNTER — Encounter: Payer: Self-pay | Admitting: Family Medicine

## 2020-10-15 ENCOUNTER — Other Ambulatory Visit: Payer: Self-pay

## 2020-10-15 ENCOUNTER — Ambulatory Visit (INDEPENDENT_AMBULATORY_CARE_PROVIDER_SITE_OTHER): Payer: PPO | Admitting: Family Medicine

## 2020-10-15 VITALS — BP 118/68 | HR 65 | Temp 98.0°F | Ht 68.0 in | Wt 157.4 lb

## 2020-10-15 DIAGNOSIS — Z0000 Encounter for general adult medical examination without abnormal findings: Secondary | ICD-10-CM | POA: Diagnosis not present

## 2020-10-15 DIAGNOSIS — E039 Hypothyroidism, unspecified: Secondary | ICD-10-CM | POA: Diagnosis not present

## 2020-10-15 DIAGNOSIS — R739 Hyperglycemia, unspecified: Secondary | ICD-10-CM

## 2020-10-15 DIAGNOSIS — Z23 Encounter for immunization: Secondary | ICD-10-CM | POA: Diagnosis not present

## 2020-10-15 LAB — T4, FREE: Free T4: 1.02 ng/dL (ref 0.60–1.60)

## 2020-10-15 LAB — HEMOGLOBIN A1C: Hgb A1c MFr Bld: 6.3 % (ref 4.6–6.5)

## 2020-10-15 LAB — TSH: TSH: 1.89 u[IU]/mL (ref 0.35–4.50)

## 2020-10-15 LAB — T3, FREE: T3, Free: 3.1 pg/mL (ref 2.3–4.2)

## 2020-10-15 NOTE — Addendum Note (Signed)
Addended by: Lerry Liner on: 10/15/2020 11:51 AM   Modules accepted: Orders

## 2020-10-15 NOTE — Addendum Note (Signed)
Addended by: Lerry Liner on: 10/15/2020 11:50 AM   Modules accepted: Orders

## 2020-10-15 NOTE — Progress Notes (Signed)
Subjective:    Patient ID: Nicholas Wilkerson, male    DOB: October 24, 1940, 80 y.o.   MRN: [id number]  HPI Here for a well exam. He feels great and has no concerns. He sees Dr. Sw[place]iland frequently for CHF, and this has been stable. His last BNP in October was 763. He had a cardiac catheterization in October showing no significant blockages. His EF per ECHO was stable at 20-25%. He says the addition of Entresto to his regimen has made a huge difference in his exercise tolerance. This past weekend he raked leaves in this yard for 6 hours and he tolerated this well. He sees Dr. Laverle Patter regularly as well. He had another prostate biopsy last year which was negative. His last LDL in October was 121. His renal function is stable, the GFR was 54 a few weeks ago.   Review of Systems  Constitutional: Negative.   HENT: Negative.   Eyes: Negative.   Respiratory: Negative.   Cardiovascular: Negative.   Gastrointestinal: Negative.   Genitourinary: Negative.   Musculoskeletal: Negative.   Skin: Negative.   Neurological: Negative.   Psychiatric/Behavioral: Negative.        Objective:   Physical Exam Constitutional:      General: He is not in acute distress.    Appearance: He is well-developed and well-nourished. He is not diaphoretic.  HENT:     Head: Normocephalic and atraumatic.     Right Ear: External ear normal.     Left Ear: External ear normal.     Nose: Nose normal.     Mouth/Throat:     Mouth: Oropharynx is clear and moist.     Pharynx: No oropharyngeal exudate.  Eyes:     General: No scleral icterus.       Right eye: No discharge.        Left eye: No discharge.     Extraocular Movements: EOM normal.     Conjunctiva/sclera: Conjunctivae normal.     Pupils: Pupils are equal, round, and reactive to light.  Neck:     Thyroid: No thyromegaly.     Vascular: No JVD.     Trachea: No tracheal deviation.  Cardiovascular:     Rate and Rhythm: Normal rate and regular rhythm.     Pulses: Intact  distal pulses.     Heart sounds: Normal heart sounds. No murmur heard. No friction rub. No gallop.   Pulmonary:     Effort: Pulmonary effort is normal. No respiratory distress.     Breath sounds: Normal breath sounds. No wheezing or rales.  Chest:     Chest wall: No tenderness.  Abdominal:     General: Bowel sounds are normal. There is no distension.     Palpations: Abdomen is soft. There is no mass.     Tenderness: There is no abdominal tenderness. There is no guarding or rebound.  Genitourinary:    Penis: No tenderness.   Musculoskeletal:        General: No tenderness or edema. Normal range of motion.     Cervical back: Neck supple.  Lymphadenopathy:     Cervical: No cervical adenopathy.  Skin:    General: Skin is warm and dry.     Coloration: Skin is not pale.     Findings: No erythema or rash.  Neurological:     Mental Status: He is alert and oriented to person, place, and time.     Cranial Nerves: No cranial nerve deficit.  Motor: No abnormal muscle tone.     Coordination: Coordination normal.     Deep Tendon Reflexes: Reflexes are normal and symmetric. Reflexes normal.  Psychiatric:        Mood and Affect: Mood and affect normal.        Behavior: Behavior normal.        Thought Content: Thought content normal.        Judgment: Judgment normal.           Assessment & Plan:  Well exam. We discussed diet and exercise. Get a thyroid panel and an A1c today. Given a flu shot.  Alysia Penna, MD

## 2020-10-30 ENCOUNTER — Telehealth (INDEPENDENT_AMBULATORY_CARE_PROVIDER_SITE_OTHER): Payer: PPO | Admitting: Family Medicine

## 2020-10-30 DIAGNOSIS — R0981 Nasal congestion: Secondary | ICD-10-CM

## 2020-10-30 DIAGNOSIS — R059 Cough, unspecified: Secondary | ICD-10-CM

## 2020-10-30 MED ORDER — BENZONATATE 100 MG PO CAPS: 100.0000 mg | ORAL_CAPSULE | Freq: Three times a day (TID) | ORAL | 0 refills | Status: DC | PRN

## 2020-10-30 NOTE — Progress Notes (Signed)
Virtual Visit via Video Note  I connected with [name]  on 10/30/20 at  5:40 PM EST by a video enabled telemedicine application and verified that I am speaking with the correct person using two identifiers.  Location patient: home,  Location provider:work or home office Persons participating in the virtual visit: patient, provider  I discussed the limitations of evaluation and management by telemedicine and the availability of in person appointments. The patient expressed understanding and agreed to proceed.   HPI:  Acute telemedicine visit for "a sinus infection": -Onset: 3 days ago -his wife is sick too and lost taste and smell so they feel she likely has covid, testing is pedning -Symptoms include: sinus congestion, nasal congestion, some sneezing, mild cough, body aches -Denies: fevers, SOB, CP, NVD, inability to get out of bed/eat/drink -Has tried:  Nothing -Allergies: nkda -PMH - see below -COVID-19 vaccine status: J and J vaccine - has not had any boosters  ROS: See pertinent positives and negatives per HPI.  Past Medical History:  Diagnosis Date  . ARTHRITIS 10/22/2008  . BPH (benign prostatic hyperplasia)   . CHF (congestive heart failure) [facility])    sees Dr. Peter [name]   . Dilated cardiomyopathy ([place])   . DIVERTICULOSIS, COLON 12/08/2007  . Glaucoma    sees Dr. Heather [place]   . H/O asbestos exposure   . History of echocardiogram 05/22/2007   EF was 45-50% / Mild concentric LV hypertrophy with mild global hypokinesis and overall mild systolic dysfunction .  Mild AV sclerosis / Mild Mitral insufficiency / compared to prior study 04/24/02 -- LV function has improved further.    . History of kidney stones   . Hypercholesterolemia   . Hypertension   . Insomnia 06/11/2015  . Radiation 01/01/15-02/18/15   base of tongue and bilateral neck 70 Gy  . Skin cancer    squamous cell, basal cell  . Squamous cell carcinoma 11/20/14   base of tongue primary  . Throat cancer ([place])  10/2014   had chemo  . Thyroid disease     Past Surgical History:  Procedure Laterality Date  . CARDIAC CATHETERIZATION  10/17/2001   EF estimated at 30% / moderate LV enlargement  / 1. Minimal nonobstructive atherosclerotic coronary artery disease / 2. Severe LV dysfunction with global hypokinesia consistent with dilated nonischemic cardiomyopath / 3. Moderate pulmonary hypertension  . COLONOSCOPY  06/23/2017   per Dr. Carlean Purl, clear, no repeats needed   . LYMPH NODE BIOPSY    . RIGHT/LEFT HEART CATH AND CORONARY ANGIOGRAPHY N/A 07/22/2020   Procedure: RIGHT/LEFT HEART CATH AND CORONARY ANGIOGRAPHY;  Surgeon: [name], [name], MD;  Location: [facility];  Service: Cardiovascular;  Laterality: N/A;     Current Outpatient Medications:  .  benzonatate (TESSALON PERLES) 100 MG capsule, Take 1 capsule (100 mg total) by mouth 3 (three) times daily as needed., Disp: 20 capsule, Rfl: 0 .  aspirin EC 81 MG tablet, Take 81 mg by mouth daily., Disp: , Rfl:  .  atorvastatin (LIPITOR) 40 MG tablet, Take 1 tablet (40 mg total) by mouth daily., Disp: 90 tablet, Rfl: 3 .  B Complex-C (B-COMPLEX WITH VITAMIN C) tablet, Take 1 tablet by mouth daily., Disp: , Rfl:  .  brimonidine (ALPHAGAN) 0.2 % ophthalmic solution, Place 1 drop into both eyes at bedtime. , Disp: , Rfl:  .  carvedilol (COREG) 25 MG tablet, Take 1 tablet (25 mg total) by mouth 2 (two) times daily., Disp: 180 tablet, Rfl:  3 .  dapagliflozin propanediol (FARXIGA) 10 MG TABS tablet, Take 1 tablet (10 mg total) by mouth daily before breakfast., Disp: 90 tablet, Rfl: 3 .  fluticasone (FLONASE) 50 MCG/ACT nasal spray, Place 2 sprays into both nostrils daily. (Patient taking differently: Place 2 sprays into both nostrils at bedtime.), Disp: 16 g, Rfl: 11 .  furosemide (LASIX) 40 MG tablet, Take 1 tablet (40 mg total) by mouth daily., Disp: 90 tablet, Rfl: 3 .  latanoprost (XALATAN) 0.005 % ophthalmic solution, Place 1 drop into both eyes at  bedtime. , Disp: , Rfl:  .  levothyroxine (SYNTHROID) 75 MCG tablet, Take 1 tablet by mouth once daily, Disp: 90 tablet, Rfl: 0 .  Melatonin 10 MG TABS, Take 10 mg by mouth at bedtime. , Disp: , Rfl:  .  Multiple Vitamin (MULTIVITAMIN WITH MINERALS) TABS tablet, Take 1 tablet by mouth daily., Disp: , Rfl:  .  sacubitril-valsartan (ENTRESTO) 49-51 MG, Take 1 tablet by mouth 2 (two) times daily., Disp: 180 tablet, Rfl: 3 .  sodium fluoride (FLUORISHIELD) 1.1 % GEL dental gel, Instill one drop of gel per tooth space of fluoride tray. Place over teeth for 5 minutes. Remove. Spit out excess. Repeat nightly. (Patient taking differently: Place 1 application onto teeth at bedtime. Instill one drop of gel per tooth space of fluoride tray. Place over teeth for 5 minutes. Remove. Spit out excess. Repeat nightly.), Disp: 120 mL, Rfl: prn .  spironolactone (ALDACTONE) 25 MG tablet, Take 1 tablet (25 mg total) by mouth daily., Disp: 90 tablet, Rfl: 3 .  tamsulosin (FLOMAX) 0.4 MG CAPS capsule, Take 1 capsule by mouth once daily, Disp: 90 capsule, Rfl: 0  EXAM:  VITALS per patient if applicable:  GENERAL: alert, oriented, appears well and in no acute distress  HEENT: atraumatic, conjunttiva clear, no obvious abnormalities on inspection of external nose and ears  NECK: normal movements of the head and neck  LUNGS: on inspection no signs of respiratory distress, breathing rate appears normal, no obvious gross SOB, gasping or wheezing  CV: no obvious cyanosis  MS: moves all visible extremities without noticeable abnormality  PSYCH/NEURO: pleasant and cooperative, no obvious depression or anxiety, speech and thought processing grossly intact  ASSESSMENT AND PLAN:  Discussed the following assessment and plan:  Sinus congestion  Cough  -we discussed possible serious and likely etiologies, options for evaluation and workup, limitations of telemedicine visit vs in person visit, treatment, treatment risks  and precautions. Pt prefers to treat via telemedicine empirically rather than in person at this moment.  Query viral upper respiratory infection, COVID-19 versus other.  Possible influenza, but no fevers less likely.  Doubt bacterial sinusitis given duration of symptoms and wife is sick with similar.  Opted for treatment with nasal saline, analgesics as needed, Tessalon for cough and advised COVID testing.  Discussed options for COVID testing and they agree to do so.  Advised prompt follow-up with primary care doctor if testing is positive for consideration of treatment referral -discussed options and treatment window. Scheduled follow up with PCP offered: He agreed to schedule follow-up as needed. Advised to seek prompt in person care if worsening, new symptoms arise, or if is not improving with treatment. Discussed options for inperson care if PCP office not available. Did let this patient know that I only do telemedicine on Tuesdays and Thursdays for [place]. Advised to schedule follow up visit with PCP or [facility] if any further questions or concerns to avoid delays in care.  I discussed the assessment and treatment plan with the patient. The patient was provided an opportunity to ask questions and all were answered. The patient agreed with the plan and demonstrated an understanding of the instructions.     Lucretia Kern, DO

## 2020-10-30 NOTE — Patient Instructions (Addendum)
  HOME CARE TIPS:  -[place] testing information: https://www.[facility].org/ OR [phone] Most pharmacies also offer testing and home test kits.  -I sent the medication(s) we discussed to your pharmacy: Meds ordered this encounter  Medications  . benzonatate (TESSALON PERLES) 100 MG capsule    Sig: Take 1 capsule (100 mg total) by mouth 3 (three) times daily as needed.    Dispense:  20 capsule    Refill:  0     -can use tylenol  if needed for fevers, aches and pains per instructions  -can use nasal saline a few times per day if you have nasal congestion  -stay hydrated, drink plenty of fluids and eat small healthy meals - avoid dairy  -can take 1000 IU (68mcg) Vit D3 and 100-500 mg of Vit C daily per instructions  -If the Covid test is positive, check out the CDC website for more information on home care, transmission and treatment for COVID19  -follow up with your doctor in 2-3 days unless improving and feeling better  -stay home while sick, except to seek medical care, and if you have COVID19 ideally it would be best to stay home for a full 10 days since the onset of symptoms PLUS one day of no fever and feeling better. Wear a good mask (such as N95 or KN95) if around others to reduce the risk of transmission.  It was nice to meet you today, and I really hope you are feeling better soon. I help [place] out with telemedicine visits on Tuesdays and Thursdays and am available for visits on those days. If you have any concerns or questions following this visit please schedule a follow up visit with your Primary Care doctor or seek care at a local urgent care clinic to avoid delays in care.    Seek in person care or schedule a follow up video visit promptly if your symptoms worsen, new concerns arise or you are not improving with treatment. Call 911 and/or seek emergency care if your symptoms are severe or life threatening.

## 2020-10-31 ENCOUNTER — Other Ambulatory Visit: Payer: PPO

## 2020-10-31 DIAGNOSIS — Z20822 Contact with and (suspected) exposure to covid-19: Secondary | ICD-10-CM

## 2020-11-02 LAB — NOVEL CORONAVIRUS, NAA: SARS-CoV-2, NAA: NOT DETECTED

## 2020-11-02 LAB — SARS-COV-2, NAA 2 DAY TAT

## 2020-11-17 NOTE — Progress Notes (Signed)
Nicholas Wilkerson Date of Birth: 15-Aug-1941   History of Present Illness: Nicholas Wilkerson is seen for followup. He has a history of dilated cardiomyopathy with initial ejection fraction of 30% over 18 years ago. Subsequent evaluation has demonstrated improvement to 50-55%. Last Echo was in January 2018 showing decrease in EF to 35-40%. ACEi was resumed at that time. . 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 He was treated with RT and chemo.   Last November he complained of some palpitations. Event monitor short short bursts of SVT and NSVT. No bradycardia or pauses. We increased his Coreg dose at that time.  He was seen this past month and he noted he had extensive Urologic evaluation for an elevated PSA with no cancer found. He did complain of getting tired real easy with activity. Thinks the higher dose of Coreg has slowed him down. No edema. No chest pain. He does note some change in his breathing.  We updated an Echocardiogram which demonstrated significant decrease in LV function with  EF down to 20-25%. There was also evidence of significant pulmonary HTN.   We performed cardiac cath showing nonobstructive CAD. Moderately elevated LV filling pressures. Moderate pulmonary HTN.  He was started on Lasix 40 mg daily. Continued Coreg and Jardiance. Added Entresto. Now up to 49/51 mg bid. Added aldactone 25 mg daily. He is tolerating this well. He has noted that if he takes everything at once in the morning he gets real lightheaded. He separates some of his medication by a couple of hours and does well with this.  Still active walking and playing golf. Is redoing a bathroom.   Current Outpatient Medications on File Prior to Visit  Medication Sig Dispense Refill  . aspirin EC 81 MG tablet Take 81 mg by mouth daily.    Marland Kitchen atorvastatin (LIPITOR) 40 MG tablet Take 1 tablet (40 mg total) by mouth daily. 90 tablet 3  . B Complex-C (B-COMPLEX WITH VITAMIN C) tablet Take 1 tablet by mouth  daily.    . brimonidine (ALPHAGAN) 0.2 % ophthalmic solution Place 1 drop into both eyes at bedtime.     . carvedilol (COREG) 25 MG tablet Take 1 tablet (25 mg total) by mouth 2 (two) times daily. 180 tablet 3  . fluticasone (FLONASE) 50 MCG/ACT nasal spray Place 2 sprays into both nostrils daily. 16 g 11  . furosemide (LASIX) 40 MG tablet Take 1 tablet (40 mg total) by mouth daily. 90 tablet 3  . latanoprost (XALATAN) 0.005 % ophthalmic solution Place 1 drop into both eyes at bedtime.     Marland Kitchen levothyroxine (SYNTHROID) 75 MCG tablet Take 1 tablet by mouth once daily 90 tablet 0  . Melatonin 10 MG TABS Take 10 mg by mouth at bedtime.     . Multiple Vitamin (MULTIVITAMIN WITH MINERALS) TABS tablet Take 1 tablet by mouth daily.    . sacubitril-valsartan (ENTRESTO) 49-51 MG Take 1 tablet by mouth 2 (two) times daily. 180 tablet 3  . sodium fluoride (FLUORISHIELD) 1.1 % GEL dental gel Instill one drop of gel per tooth space of fluoride tray. Place over teeth for 5 minutes. Remove. Spit out excess. Repeat nightly. (Patient taking differently: Place 1 application onto teeth at bedtime. Instill one drop of gel per tooth space of fluoride tray. Place over teeth for 5 minutes. Remove. Spit out excess. Repeat nightly.) 120 mL prn  . spironolactone (ALDACTONE) 25 MG tablet Take 1 tablet (25 mg total)  by mouth daily. 90 tablet 3  . tamsulosin (FLOMAX) 0.4 MG CAPS capsule Take 1 capsule by mouth once daily 90 capsule 0   No current facility-administered medications on file prior to visit.    No Known Allergies  Past Medical History:  Diagnosis Date  . ARTHRITIS 10/22/2008  . BPH (benign prostatic hyperplasia)   . CHF (congestive heart failure) [facility])    sees Dr. Jakiyah Stepney [name]   . Dilated cardiomyopathy ([place])   . DIVERTICULOSIS, COLON 12/08/2007  . Glaucoma    sees Dr. Heather [place]   . H/O asbestos exposure   . History of echocardiogram 05/22/2007   EF was 45-50% / Mild concentric LV hypertrophy with mild  global hypokinesis and overall mild systolic dysfunction .  Mild AV sclerosis / Mild Mitral insufficiency / compared to prior study 04/24/02 -- LV function has improved further.    . History of kidney stones   . Hypercholesterolemia   . Hypertension   . Insomnia 06/11/2015  . Radiation 01/01/15-02/18/15   base of tongue and bilateral neck 70 Gy  . Skin cancer    squamous cell, basal cell  . Squamous cell carcinoma 11/20/14   base of tongue primary  . Throat cancer ([place]) 10/2014   had chemo  . Thyroid disease     Past Surgical History:  Procedure Laterality Date  . CARDIAC CATHETERIZATION  10/17/2001   EF estimated at 30% / moderate LV enlargement  / 1. Minimal nonobstructive atherosclerotic coronary artery disease / 2. Severe LV dysfunction with global hypokinesia consistent with dilated nonischemic cardiomyopath / 3. Moderate pulmonary hypertension  . COLONOSCOPY  06/23/2017   per Dr. Carlean Purl, clear, no repeats needed   . LYMPH NODE BIOPSY    . RIGHT/LEFT HEART CATH AND CORONARY ANGIOGRAPHY N/A 07/22/2020   Procedure: RIGHT/LEFT HEART CATH AND CORONARY ANGIOGRAPHY;  Surgeon: [name], Roc Streett M, MD;  Location: [facility];  Service: Cardiovascular;  Laterality: N/A;    Social History   Tobacco Use  Smoking Status Never Smoker  Smokeless Tobacco Never Used    Social History   Substance and Sexual Activity  Alcohol Use Yes  . Alcohol/week: 0.0 standard drinks   Comment: occasional beer, a couple times a month    Family History  Problem Relation Age of Onset  . Stroke Father   . Cancer Father        kidney ca  . Angina Mother   . Colon cancer Neg Hx   . Esophageal cancer Neg Hx   . Rectal cancer Neg Hx   . Stomach cancer Neg Hx     Review of Systems:  All other systems were reviewed and are negative.  Physical Exam: BP 106/72   Pulse 64   Ht 5\' 9"  (1.753 m)   Wt 158 lb 6.4 oz (71.8 kg)   BMI 23.39 kg/m  GENERAL:  Well appearing WM in NAD HEENT:  PERRL,  EOMI, sclera are clear. Oropharynx is clear. NECK:  No jugular venous distention, carotid upstroke brisk and symmetric, no bruits, no thyromegaly or adenopathy LUNGS:  Clear to auscultation bilaterally CHEST:  Unremarkable HEART:  RRR with extrasystoles,  PMI not displaced or sustained,S1 and S2 within normal limits, no S3, no S4: no clicks, no rubs, no murmurs ABD:  Soft, nontender. BS +, no masses or bruits. No hepatomegaly, no splenomegaly EXT:  2 + pulses throughout, no edema, no cyanosis no clubbing SKIN:  Warm and dry.  No rashes NEURO:  Alert and  oriented x 3. Cranial nerves II through XII intact. PSYCH:  Cognitively intact      LABORATORY DATA:  Lab Results  Component Value Date   WBC 6.4 07/15/2020   HGB 18.0 (H) 07/22/2020   HCT 53.0 (H) 07/22/2020   PLT 208 07/15/2020   GLUCOSE 101 (H) 10/07/2020   CHOL 184 07/15/2020   TRIG 153 (H) 07/15/2020   HDL 36 (L) 07/15/2020   LDLCALC 121 (H) 07/15/2020   ALT 21 07/15/2020   AST 20 07/15/2020   NA 139 10/07/2020   K 4.5 10/07/2020   CL 102 10/07/2020   CREATININE 1.27 10/07/2020   BUN 21 10/07/2020   CO2 27 10/07/2020   TSH 1.89 10/15/2020   PSA 7.93 (H) 10/15/2019   INR 0.95 07/17/2015   HGBA1C 6.3 10/15/2020     Echo 11/02/16: Study Conclusions  - Left ventricle: The cavity size was mildly dilated. Wall   thickness was normal. Systolic function was moderately reduced.   The estimated ejection fraction was in the range of 35% to 40%.   Diffuse hypokinesis. Echogenic linear structure at the apex,   suggestive of calcified apical false tendon. Doppler parameters   are consistent with pseudonormal left ventricular relaxation   (grade 2 diastolic dysfunction). The E/e&' ratio is >15,   suggesting elevated LV filling pressure. - Mitral valve: Mildly thickened leaflets . There was mild   regurgitation. - Left atrium: The atrium was mildly dilated. - Inferior vena cava: The vessel was dilated. The respirophasic    diameter changes were blunted (< 50%), consistent with elevated   central venous pressure.  Impressions:  - Compared to a prior study in 2012, the LVEF is lower at 35-40%.   The LV is mildly dilated and globally hypokinetic. LV filling   pressure appears elevated with grade 2 DD.  Echo 07/14/20: 1. Left ventricular ejection fraction, by estimation, is 20 to 25%. The left ventricle has severely decreased function. The left ventricle demonstrates global hypokinesis. The left ventricular internal cavity size was mildly dilated. Left ventricular diastolic parameters are consistent with Grade III diastolic dysfunction (restrictive). Elevated left ventricular end-diastolic pressure. 2. Right ventricular systolic function is normal. The right ventricular size is normal. There is severely elevated pulmonary artery systolic pressure. 3. Left atrial size was severely dilated. 4. The mitral valve is normal in structure. Mild mitral valve regurgitation. No evidence of mitral stenosis. 5. The aortic valve is tricuspid. There is mild calcification of the aortic valve. There is mild thickening of the aortic valve. Aortic valve regurgitation is not visualized. No aortic stenosis is present. 6. The inferior vena cava is normal in size with <50% respiratory variability, suggesting right atrial pressure of 8 mmHg.  Cardiac cath 07/22/20:  RIGHT/LEFT HEART CATH AND CORONARY ANGIOGRAPHY  Conclusion    Prox LAD to Mid LAD lesion is 25% stenosed.  Prox Cx to Dist Cx lesion is 25% stenosed.  Prox RCA to Mid RCA lesion is 15% stenosed.  LV end diastolic pressure is moderately elevated.  Hemodynamic findings consistent with moderate pulmonary hypertension.   1. Nonobstructive CAD 2. Low cardiac output. Index 1.86 3. Moderately elevated LV filling pressures 4. Moderate Pulmonary HTN  Plan: optimize medical therapy for CHF. On Coreg. Just started [place]. Will add lasix 40 mg daily. Further  titration of medication depending on initial response.    Event monitor 10/03/19: Study Highlights   Normal sinus rhythm  Occasional runs of NSVT longest lasting 7 beats.  Infrequent runs  of SVT longest 9 beats.  No significant bradycardia or pauses  Average HR 71 bpm     Assessment / Plan:  1. Dilated cardiomyopathy with EF 35-40% in 2018.   Echo shows significant decline in LV function with EF 20-25%.  Symptoms have improved with change in medication. Will continue Coreg at 25 mg bid,  Entresto 49/51 mg bid, aldactone 25 mg daily and lasix. His insurance apparently will cover Jardiance so will add 10 mg daily. Repeat Echo in 4 weeks. Follow up 3 months. I don't think he would tolerate highest dose of Entresto.    2. Hypertension well controlled.  3. PVCs/brief SVT/NSVT.  Continue carvedilol but at lower dose 25 mg bid.   4. SSCA of the tongue. S/p RT and chemo.

## 2020-11-19 ENCOUNTER — Ambulatory Visit: Payer: PPO | Admitting: Cardiology

## 2020-11-19 ENCOUNTER — Encounter: Payer: Self-pay | Admitting: Cardiology

## 2020-11-19 ENCOUNTER — Other Ambulatory Visit: Payer: Self-pay

## 2020-11-19 VITALS — BP 106/72 | HR 64 | Ht 69.0 in | Wt 158.4 lb

## 2020-11-19 DIAGNOSIS — I493 Ventricular premature depolarization: Secondary | ICD-10-CM

## 2020-11-19 DIAGNOSIS — I428 Other cardiomyopathies: Secondary | ICD-10-CM

## 2020-11-19 DIAGNOSIS — I420 Dilated cardiomyopathy: Secondary | ICD-10-CM | POA: Diagnosis not present

## 2020-11-19 DIAGNOSIS — I5022 Chronic systolic (congestive) heart failure: Secondary | ICD-10-CM | POA: Diagnosis not present

## 2020-11-19 MED ORDER — EMPAGLIFLOZIN 10 MG PO TABS: 10.0000 mg | ORAL_TABLET | Freq: Every day | ORAL | 3 refills | Status: DC

## 2020-11-19 NOTE — Patient Instructions (Signed)
Add Jardiance 10 mg daily to your current therapy  We will check an Echocardiogram in 4 weeks  Follow up in 3 months

## 2020-11-24 ENCOUNTER — Other Ambulatory Visit (HOSPITAL_COMMUNITY): Payer: Self-pay | Admitting: Dentistry

## 2020-11-24 MED ORDER — SODIUM FLUORIDE 1.1 % DT GEL: 1.0000 "application " | Freq: Every day | DENTAL | 11 refills | Status: DC

## 2020-11-24 MED FILL — SODIUM FLUORIDE 1.1 % GEL: 1.1 | 30 days supply | Qty: 56 | Fill #0

## 2020-11-24 NOTE — Progress Notes (Signed)
Rx called into [facility] pharmacy for sodium fluoride gel 1.1% for refill.  Per oncology team, patient's regular dentist has retired and has not yet found a new dentist to prescribe him fluoride as Dr. Enrique Sack previously had for his h/o head and neck radiation.    Nicholas Wilkerson, DMD

## 2020-12-02 ENCOUNTER — Encounter: Payer: Self-pay | Admitting: Pharmacist

## 2020-12-02 NOTE — Chronic Care Management (AMB) (Signed)
ERROR

## 2020-12-08 DIAGNOSIS — H401131 Primary open-angle glaucoma, bilateral, mild stage: Secondary | ICD-10-CM | POA: Diagnosis not present

## 2020-12-10 DIAGNOSIS — R972 Elevated prostate specific antigen [PSA]: Secondary | ICD-10-CM | POA: Diagnosis not present

## 2020-12-17 ENCOUNTER — Other Ambulatory Visit (HOSPITAL_COMMUNITY): Payer: PPO

## 2020-12-17 DIAGNOSIS — N402 Nodular prostate without lower urinary tract symptoms: Secondary | ICD-10-CM | POA: Diagnosis not present

## 2020-12-17 DIAGNOSIS — R972 Elevated prostate specific antigen [PSA]: Secondary | ICD-10-CM | POA: Diagnosis not present

## 2020-12-22 ENCOUNTER — Ambulatory Visit: Payer: PPO | Admitting: Cardiology

## 2021-01-05 ENCOUNTER — Other Ambulatory Visit: Payer: Self-pay

## 2021-01-05 ENCOUNTER — Ambulatory Visit (HOSPITAL_COMMUNITY): Payer: PPO | Attending: Internal Medicine

## 2021-01-05 DIAGNOSIS — I5022 Chronic systolic (congestive) heart failure: Secondary | ICD-10-CM | POA: Insufficient documentation

## 2021-01-05 DIAGNOSIS — I420 Dilated cardiomyopathy: Secondary | ICD-10-CM | POA: Diagnosis not present

## 2021-01-05 DIAGNOSIS — I493 Ventricular premature depolarization: Secondary | ICD-10-CM | POA: Insufficient documentation

## 2021-01-05 DIAGNOSIS — I428 Other cardiomyopathies: Secondary | ICD-10-CM | POA: Insufficient documentation

## 2021-01-05 LAB — ECHOCARDIOGRAM COMPLETE
Area-P 1/2: 4.93 cm2
S' Lateral: 5 cm

## 2021-01-07 ENCOUNTER — Other Ambulatory Visit: Payer: Self-pay

## 2021-01-07 DIAGNOSIS — I420 Dilated cardiomyopathy: Secondary | ICD-10-CM

## 2021-01-07 DIAGNOSIS — I428 Other cardiomyopathies: Secondary | ICD-10-CM

## 2021-01-07 NOTE — Progress Notes (Signed)
mb

## 2021-01-15 ENCOUNTER — Telehealth: Payer: PPO

## 2021-01-19 ENCOUNTER — Other Ambulatory Visit (HOSPITAL_COMMUNITY): Payer: Self-pay

## 2021-01-19 MED FILL — Sodium Fluoride Gel 1.1% (0.5% F): DENTAL | 30 days supply | Qty: 112 | Fill #0 | Status: AC

## 2021-01-20 ENCOUNTER — Other Ambulatory Visit (HOSPITAL_COMMUNITY): Payer: Self-pay

## 2021-01-20 ENCOUNTER — Other Ambulatory Visit: Payer: Self-pay

## 2021-01-20 ENCOUNTER — Ambulatory Visit: Payer: PPO | Admitting: Cardiology

## 2021-01-20 ENCOUNTER — Encounter: Payer: Self-pay | Admitting: Cardiology

## 2021-01-20 VITALS — BP 94/68 | HR 77 | Ht 69.0 in | Wt 162.0 lb

## 2021-01-20 DIAGNOSIS — Z01812 Encounter for preprocedural laboratory examination: Secondary | ICD-10-CM

## 2021-01-20 DIAGNOSIS — I5022 Chronic systolic (congestive) heart failure: Secondary | ICD-10-CM | POA: Diagnosis not present

## 2021-01-20 NOTE — Patient Instructions (Signed)
Medication Instructions:  Your physician recommends that you continue on your current medications as directed. Please refer to the Current Medication list given to you today.  *If you need a refill on your cardiac medications before your next appointment, please call your pharmacy*   Lab Work: None ordered If you have labs (blood work) drawn today and your tests are completely normal, you will receive your results only by: Marland Kitchen MyChart Message (if you have MyChart) OR . A paper copy in the mail If you have any lab test that is abnormal or we need to change your treatment, we will call you to review the results.   Testing/Procedures: Your physician has recommended that you have a defibrillator inserted. An implantable cardioverter defibrillator (ICD) is a small device that is placed in your chest or, in rare cases, your abdomen. This device uses electrical pulses or shocks to help control life-threatening, irregular heartbeats that could lead the heart to suddenly stop beating (sudden cardiac arrest). Leads are attached to the ICD that goes into your heart. This is done in the hospital and usually requires an overnight stay. Please see the instruction sheet below under "other instructions"    Follow-Up: At [facility], you and your health needs are our priority.  As part of our continuing mission to provide you with exceptional heart care, we have created designated Provider Care Teams.  These Care Teams include your primary Cardiologist (physician) and Advanced Practice Providers (APPs -  Physician Assistants and Nurse Practitioners) who all work together to provide you with the care you need, when you need it.  We recommend signing up for the patient portal called "MyChart".  Sign up information is provided on this After Visit Summary.  MyChart is used to connect with patients for Virtual Visits (Telemedicine).  Patients are able to view lab/test results, encounter notes, upcoming appointments,  etc.  Non-urgent messages can be sent to your provider as well.   To learn more about what you can do with MyChart, go to [email or web address].    Your next appointment:   2 week(s) after your ICD implant on 03/04/21  The format for your next appointment:   In Person  Provider:   device clinic for a wound check   Your physician recommends that you schedule a follow-up appointment in: 91 days after your defibrillator implant on 03/04/2021.  Thank you for choosing [facility]!!   Trinidad Curet, RN [phone]   Other Instructions   Implantable Device Instructions  You are scheduled for: Implantable cardiac defibrillator on 03/04/2021 with Dr. Curt Bears.  1.   Pre procedure testing-             A.  LAB WORK--- On 03/02/2021 @ 10:00 am (before your Covid screening)  for your pre procedure blood work.  You do NOT need to be fasting.              B. COVID TEST-- On 03/02/21 @ 10:45 am - This is a Drive Up Visit at [address] [place], [place] [address].  Someone will direct you to the appropriate testing line. Stay in your car and someone will be with you shortly.   After you are tested please go home and self quarantine until the day of your procedure.    2. On the day of your procedure 03/04/2021 you will go to [facility] [phone] [address]) at 9:30 am .  Dennis Bast will go to the main entrance [address] [employer])  and enter where the [place] parking staff are.  You will check in at ADMITTING.  You may have one support person come in to the hospital with you.  They will be asked to wait in the waiting room.   3.   Do not eat or drink after midnight prior to your procedure.   4.   On the morning of your procedure do NOT take any medication.  5.  The night before your procedure and the morning of your procedure scrub your neck/chest with surgical scrub.  See instruction letter.   5.  Plan for an overnight stay, but you may be discharged home after your procedure. If you use your  phone frequently bring your phone charger, in case you have to stay.  If you are discharged after your procedure you will need someone to drive you home and be with your for 24 hours after your procedure.   6.  You will follow up with the [place] clinic 10-14 days after your procedure. You will follow up with Dr. Curt Bears 91 days after your procedure.  These appointments will be made for you.   * If you have ANY questions after you get home, please call the office [phone] and ask for Nyoka Alcoser RN or send a MyChart message.    [place] - Preparing For Surgery (surgical scrub)  Before surgery, you can play an important role. Because skin is not sterile, your skin needs to be as free of germs as possible. You can reduce the number of germs on your skin by washing with CHG (chlorahexidine gluconate) Soap before surgery.  CHG is an antiseptic cleaner which kills germs and bonds with the skin to continue killing germs even after washing.   Please do not use if you have an allergy to CHG or antibacterial soaps.  If your skin becomes reddened/irritated stop using the CHG.   Do not shave (including legs and underarms) for at least 48 hours prior to first CHG shower.  It is OK to shave your face.  Please follow these instructions carefully:  1.  Shower the night before surgery and the morning of surgery with CHG.  2.  If you choose to wash your hair, wash your hair first as usual with your normal shampoo.  3.  After you shampoo, rinse your hair and body thoroughly to remove the shampoo.  4.  Use CHG as you would any other liquid soap.  You can apply CHG directly to the skin and wash gently with a clean washcloth. 5.  Apply the CHG Soap to your body ONLY FROM THE NECK DOWN.  Do not use on open wounds or open sores.  Avoid contact with your eyes, ears, mouth and genitals (private parts).  Wash genitals (private parts) with your normal soap.  6.  Wash thoroughly, paying special attention to  the area where your surgery will be performed.  7.  Thoroughly rinse your body with warm water from the neck down.   8.  DO NOT shower/wash with your normal soap after using and rinsing off the CHG soap.  9.  Pat yourself dry with a clean towel.           10.  Wear clean pajamas.           11.  Place clean sheets on your bed the night of your first shower and do not sleep with pets.  Day of Surgery: Do not apply any deodorants/lotions.  Please  wear clean clothes to the hospital/surgery center.    Cardioverter Defibrillator Implantation An implantable cardioverter defibrillator (ICD) is a device that identifies and corrects abnormal heart rhythms. Cardioverter defibrillator implantation is a surgery to place an ICD under the skin in the chest or abdomen. An ICD has a battery, a small computer (pulse generator), and wires (leads) that go into the heart. The ICD detects and corrects two types of dangerous irregular heart rhythms (arrhythmias):  A rapid heart rhythm in the lower chambers of the heart (ventricles). This is called ventricular tachycardia.  The ventricles contracting in an uncoordinated way. This is called ventricular fibrillation. There are different types of ICDs, and the electrical signals from the ICD can be programmed differently based on the condition being treated. The electrical signals from the ICD can be low-energy pulses, high-energy shocks, or a combination of the two. The low-energy pulses are generally used to restore the heartbeat to normal when it is either too slow (bradycardia) or too fast. These pulses are painless. The high-energy shocks are used to treat abnormal rhythms such as ventricular tachycardia or ventricular fibrillation. This shock may feel like a strong jolt in the chest. Your health care provider may recommend an ICD if you have:  Had a ventricular arrhythmia in the past.  A damaged heart because of a disease or heart condition.  A weakened heart  muscle from a heart attack or cardiac arrest.  A congenital heart defect.  Long QT syndrome, which is a disorder of the heart's electrical system.  Brugada syndrome, which is a condition that causes a disruption of the heart's normal rhythm. Tell a health care provider about:  Any allergies you have.  All medicines you are taking, including vitamins, herbs, eye drops, creams, and over-the-counter medicines.  Any problems you or family members have had with anesthetic medicines.  Any blood disorders you have.  Any surgeries you have had.  Any medical conditions you have.  Whether you are pregnant or may be pregnant. What are the risks? Generally, this is a safe procedure. However, problems may occur, including:  Infection.  Bleeding.  Allergic reactions to medicines used during the procedure.  Blood clots.  Swelling or bruising.  Damage to nearby structures or organs, such as nerves, lungs, blood vessels, or the heart where the ICD leads or pulse generator is implanted. What happens before the procedure? Staying hydrated Follow instructions from your health care provider about hydration, which may include:  Up to 2 hours before the procedure - you may continue to drink clear liquids, such as water, clear fruit juice, black coffee, and plain tea.   Eating and drinking restrictions Follow instructions from your health care provider about eating and drinking, which may include:  8 hours before the procedure - stop eating heavy meals or foods, such as meat, fried foods, or fatty foods.  6 hours before the procedure - stop eating light meals or foods, such as toast or cereal.  6 hours before the procedure - stop drinking milk or drinks that contain milk.  2 hours before the procedure - stop drinking clear liquids. Medicines Ask your health care provider about:  Changing or stopping your regular medicines. This is especially important if you are taking diabetes  medicines or blood thinners.  Taking medicines such as aspirin and ibuprofen. These medicines can thin your blood. Do not take these medicines unless your health care provider tells you to take them.  Taking over-the-counter medicines, vitamins, herbs, and supplements.  Tests You may have an exam or testing. These may include:  Blood tests.  A test to check the electrical signals in your heart (electrocardiogram, ECG).  Imaging tests, such as a chest X-ray.  Echocardiogram. This is an ultrasound of your heart to evaluate your heart structures and function.  An event monitor or Holter monitor to wear at home. General instructions  Do not use any products that contain nicotine or tobacco for at least 4 weeks before the procedure. These products include cigarettes, chewing tobacco, and vaping devices, such as e-cigarettes. If you need help quitting, ask your health care provider.  Ask your health care provider: ? How your procedure site will be marked. ? What steps will be taken to help prevent infection. These may include:  Removing hair at the surgery site.  Washing skin with a germ-killing soap.  Taking antibiotic medicine.  You may be asked to shower with a germ-killing soap.  Plan to have a responsible adult take you home from the hospital or clinic. What happens during the procedure?  Small monitors will be put on your body. They will be used to check your heart rate, blood pressure, and oxygen level.  A pair of sticky pads (defibrillator pads) may be placed on your back and chest. These pads are able to pace your heart as needed during the procedure.  An IV will be inserted into one of your veins.  You will be given one or more of the following: ? A medicine to help you relax (sedative). ? A medicine to numb the area (local anesthetic). ? A medicine to make you fall asleep(general anesthetic).  A small incision will be made to create a deep pocket under the skin of  your chest or abdomen.  Leads will be guided through a blood vessel into your heart and attached to your heart muscles. Depending on the ICD, the leads may go into one ventricle, or they may go into both ventricles and into an upper chamber of the heart. An X-ray machine (fluoroscope) will be used to help guide the leads. The other end of the leads will be attached to the pulse generator.  The pulse generator will be placed into the pocket under the skin.  The ICD will be tested, and your health care provider will program the ICD for the condition being treated.  The incision will be closed with stitches (sutures), skin glue, adhesive strips, or staples.  A bandage (dressing) will be placed over the incision. The procedure may vary among health care providers and hospitals.   What happens after the procedure?  Your blood pressure, heart rate, breathing rate, and blood oxygen level will be monitored until you leave the hospital or clinic. Your health care provider will also monitor your ICD to make sure it is working properly.  A chest X-ray will be taken to check that the ICD is in the right place.  Do not raise the arm on the side of your procedure higher than your shoulder for as long as told by your health care provider. This is usually at least 6 weeks.  You may be given an identification card explaining that you have an ICD.  You will be given a remote home monitoring device to use with your ICD to allow your device to communicate with your clinic. Summary  An implantable cardioverter defibrillator (ICD) is a device that identifies and corrects abnormal heart rhythms. Cardioverter defibrillator implantation is a surgery to place an ICD under  the skin in the chest or abdomen.  An ICD consists of a battery, a small computer (pulse generator), and wires (leads) that go into the heart.  During the procedure, the ICD will be tested, and your health care provider will program the ICD for  the condition being treated.  After the procedure, a chest X-ray will be taken to check that the ICD is in the right place. This information is not intended to replace advice given to you by your health care provider. Make sure you discuss any questions you have with your health care provider. Document Revised: 03/26/2020 Document Reviewed: 03/26/2020 Elsevier Patient Education  [place].

## 2021-01-20 NOTE — Progress Notes (Signed)
Electrophysiology Office Note   Date:  01/20/2021   ID:  Nicholas Wilkerson, DOB 03/11/1941, MRN [id number]  PCP:  Laurey Morale, MD  Cardiologist:  [name] Primary Electrophysiologist:  [name], MD    Chief Complaint: CHF   History of Present Illness: Nicholas Wilkerson is a 80 y.o. male who is being seen today for the evaluation of CHF at the request of Laurey Morale, MD. Presenting today for electrophysiology evaluation.  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  November 2021 he developed palpitations.  Cardiac monitor showed bursts of SVT and nonsustained VT.  His Coreg dose was increased.  January 2022 he started to complain of fatigue.  Echo showed an ejection fraction of 20 to 25% with significant pulmonary hypertension.  He had a left heart catheterization that showed no obstructive coronary artery disease.  He was also found to have moderate pulmonary hypertension.  Coreg and Jardiance were continued.  Entresto and Aldactone were added.  Today, he denies symptoms of palpitations, chest pain, shortness of breath, orthopnea, PND, lower extremity edema, claudication, dizziness, presyncope, syncope, bleeding, or neurologic sequela. The patient is tolerating medications without difficulties.  He currently feels well today.   Past Medical History:  Diagnosis Date  . ARTHRITIS 10/22/2008  . BPH (benign prostatic hyperplasia)   . CHF (congestive heart failure) [facility])    sees Dr. Peter [name]   . Dilated cardiomyopathy ([place])   . DIVERTICULOSIS, COLON 12/08/2007  . Glaucoma    sees Dr. Heather [place]   . H/O asbestos exposure   . History of echocardiogram 05/22/2007   EF was 45-50% / Mild concentric LV hypertrophy with mild global hypokinesis and overall mild systolic dysfunction .  Mild AV sclerosis / Mild Mitral insufficiency / compared to  prior study 04/24/02 -- LV function has improved further.    . History of kidney stones   . Hypercholesterolemia   . Hypertension   . Insomnia 06/11/2015  . Radiation 01/01/15-02/18/15   base of tongue and bilateral neck 70 Gy  . Skin cancer    squamous cell, basal cell  . Squamous cell carcinoma 11/20/14   base of tongue primary  . Throat cancer ([place]) 10/2014   had chemo  . Thyroid disease    Past Surgical History:  Procedure Laterality Date  . CARDIAC CATHETERIZATION  10/17/2001   EF estimated at 30% / moderate LV enlargement  / 1. Minimal nonobstructive atherosclerotic coronary artery disease / 2. Severe LV dysfunction with global hypokinesia consistent with dilated nonischemic cardiomyopath / 3. Moderate pulmonary hypertension  . COLONOSCOPY  06/23/2017   per Dr. Carlean Purl, clear, no repeats needed   . LYMPH NODE BIOPSY    . RIGHT/LEFT HEART CATH AND CORONARY ANGIOGRAPHY N/A 07/22/2020   Procedure: RIGHT/LEFT HEART CATH AND CORONARY ANGIOGRAPHY;  Surgeon: [name], [name], MD;  Location: [facility];  Service: Cardiovascular;  Laterality: N/A;     Current Outpatient Medications  Medication Sig Dispense Refill  . aspirin EC 81 MG tablet Take 81 mg by mouth daily.    Marland Kitchen atorvastatin (LIPITOR) 40 MG tablet Take 1 tablet (40 mg total) by mouth daily. 90 tablet 3  . B Complex-C (B-COMPLEX WITH VITAMIN C) tablet Take 1 tablet by mouth daily.    . brimonidine (ALPHAGAN) 0.2 % ophthalmic solution Place 1 drop into both eyes at bedtime.     Marland Kitchen  carvedilol (COREG) 25 MG tablet Take 1 tablet (25 mg total) by mouth 2 (two) times daily. 180 tablet 3  . empagliflozin (JARDIANCE) 10 MG TABS tablet Take 1 tablet (10 mg total) by mouth daily before breakfast. 90 tablet 3  . fluticasone (FLONASE) 50 MCG/ACT nasal spray Place 2 sprays into both nostrils daily. 16 g 11  . furosemide (LASIX) 40 MG tablet Take 1 tablet (40 mg total) by mouth daily. 90 tablet 3  . latanoprost (XALATAN) 0.005 %  ophthalmic solution Place 1 drop into both eyes at bedtime.     Marland Kitchen levothyroxine (SYNTHROID) 75 MCG tablet Take 1 tablet by mouth once daily 90 tablet 0  . Melatonin 10 MG TABS Take 10 mg by mouth at bedtime.     . Multiple Vitamin (MULTIVITAMIN WITH MINERALS) TABS tablet Take 1 tablet by mouth daily.    . sacubitril-valsartan (ENTRESTO) 49-51 MG Take 1 tablet by mouth 2 (two) times daily. 180 tablet 3  . sodium fluoride (FLUORISHIELD) 1.1 % GEL dental gel Instill one drop of gel per tooth space of fluoride tray. Place over teeth for 5 minutes. Remove. Spit out excess. Repeat nightly. (Patient taking differently: Place 1 application onto teeth at bedtime. Instill one drop of gel per tooth space of fluoride tray. Place over teeth for 5 minutes. Remove. Spit out excess. Repeat nightly.) 120 mL prn  . sodium fluoride (SODIUM FLUORIDE 5000 PPM) 1.1 % GEL dental gel Place 1 application onto teeth at bedtime. 120 mL 11  . spironolactone (ALDACTONE) 25 MG tablet Take 1 tablet (25 mg total) by mouth daily. 90 tablet 3  . tamsulosin (FLOMAX) 0.4 MG CAPS capsule Take 1 capsule by mouth once daily 90 capsule 0   No current facility-administered medications for this visit.    Allergies:   Patient has no known allergies.   Social History:  The patient  reports that he has never smoked. He has never used smokeless tobacco. He reports current alcohol use. He reports that he does not use drugs.   Family History:  The patient's family history includes Angina in his mother; Cancer in his father; Stroke in his father.    ROS:  Please see the history of present illness.   Otherwise, review of systems is positive for none.   All other systems are reviewed and negative.    PHYSICAL EXAM: VS:  BP 94/68   Pulse 77   Ht 5\' 9"  (1.753 m)   Wt 162 lb (73.5 kg)   BMI 23.92 kg/m  , BMI Body mass index is 23.92 kg/m. GEN: Well nourished, well developed, in no acute distress  HEENT: normal  Neck: no JVD, carotid  bruits, or masses Cardiac: RRR; no murmurs, rubs, or gallops,no edema  Respiratory:  clear to auscultation bilaterally, normal work of breathing GI: soft, nontender, nondistended, + BS MS: no deformity or atrophy  Skin: warm and dry Neuro:  Strength and sensation are intact Psych: euthymic mood, full affect  EKG:  EKG is ordered today. Personal review of the ekg ordered shows sinus rhythm, PVCs, rate 77  Recent Labs: 07/15/2020: ALT 21; BNP 763.5; Platelets 208 07/22/2020: Hemoglobin 18.0 10/07/2020: BUN 21; Creatinine, Ser 1.27; Potassium 4.5; Sodium 139 10/15/2020: TSH 1.89    Lipid Panel     Component Value Date/Time   CHOL 184 07/15/2020 1648   TRIG 153 (H) 07/15/2020 1648   TRIG 126 08/25/2006 0850   HDL 36 (L) 07/15/2020 1648   CHOLHDL 5.1 (H) 07/15/2020  1648   CHOLHDL 5 10/15/2019 1058   VLDL 20.4 10/15/2019 1058   LDLCALC 121 (H) 07/15/2020 1648     Wt Readings from Last 3 Encounters:  01/20/21 162 lb (73.5 kg)  11/19/20 158 lb 6.4 oz (71.8 kg)  10/15/20 157 lb 6.4 oz (71.4 kg)      Other studies Reviewed: Additional studies/ records that were reviewed today include: TTE 01/05/21  Review of the above records today demonstrates:  1. Left ventricular ejection fraction, by estimation, is 20 to 25%. The  left ventricle has severely decreased function. The left ventricle  demonstrates global hypokinesis. The left ventricular internal cavity size  was mildly to moderately dilated. Left  ventricular diastolic parameters are consistent with Grade II diastolic  dysfunction (pseudonormalization). Elevated left atrial pressure.  2. Right ventricular systolic function is normal. The right ventricular  size is normal.  3. Left atrial size was mild to moderately dilated.  4. The mitral valve is normal in structure. Mild mitral valve  regurgitation.  5. The aortic valve is tricuspid. Aortic valve regurgitation is not  visualized. Mild aortic valve sclerosis is  present, with no evidence of  aortic valve stenosis.  6. The inferior vena cava is normal in size with greater than 50%  respiratory variability, suggesting right atrial pressure of 3 mmHg.   RHC/LHC 07/22/20  Prox LAD to Mid LAD lesion is 25% stenosed.  Prox Cx to Dist Cx lesion is 25% stenosed.  Prox RCA to Mid RCA lesion is 15% stenosed.  LV end diastolic pressure is moderately elevated.  Hemodynamic findings consistent with moderate pulmonary hypertension.   ASSESSMENT AND PLAN:  1.  Chronic systolic heart failure secondary to dilated cardiomyopathy: Ejection fraction is 20 to 25% currently on optimal medical therapy with Coreg, Entresto, Aldactone, Jardiance.  Repeat echo showed a continued reduced ejection fraction.  At this point he would benefit from ICD implant.  Risk and benefits of been discussed risk of bleeding, tamponade, infection, pneumothorax.  He understands these risks and is agreed to the procedure.  2.  Hypertension: Currently well controlled  3.  PVCs/SVT/nonsustained VT: Continue carvedilol.  Case discussed with primary physician  Current medicines are reviewed at length with the patient today.   The patient does not have concerns regarding his medicines.  The following changes were made today:  none  Labs/ tests ordered today include:  Orders Placed This Encounter  Procedures  . Basic metabolic panel  . CBC  . EKG 12-Lead     Disposition:   FU with Genova Kiner 3 months  Signed, Shakeyla Giebler Meredith Leeds, MD  01/20/2021 11:07 AM     [facility] [address] [place] [address] [phone] (office) [phone] (fax)

## 2021-01-21 ENCOUNTER — Other Ambulatory Visit (HOSPITAL_COMMUNITY): Payer: Self-pay

## 2021-01-22 ENCOUNTER — Other Ambulatory Visit (HOSPITAL_COMMUNITY): Payer: Self-pay

## 2021-01-27 ENCOUNTER — Telehealth: Payer: Self-pay | Admitting: Pharmacist

## 2021-01-27 NOTE — Chronic Care Management (AMB) (Signed)
I spoke with the patient about his upcoming appointment on 04.20.2022 @ 10:00 am with the clinical pharmacist. He was asked to please have all medication on hand to review with the pharmacist. He confirmed the appointment.   Maia Breslow, [place] [phone]

## 2021-01-28 ENCOUNTER — Ambulatory Visit (INDEPENDENT_AMBULATORY_CARE_PROVIDER_SITE_OTHER): Payer: PPO | Admitting: Pharmacist

## 2021-01-28 DIAGNOSIS — E039 Hypothyroidism, unspecified: Secondary | ICD-10-CM

## 2021-01-28 DIAGNOSIS — I10 Essential (primary) hypertension: Secondary | ICD-10-CM | POA: Diagnosis not present

## 2021-01-28 DIAGNOSIS — E7800 Pure hypercholesterolemia, unspecified: Secondary | ICD-10-CM

## 2021-01-28 NOTE — Progress Notes (Signed)
Chronic Care Management Pharmacy Note  02/02/2021 Name:  Nicholas Wilkerson MRN:  [id number] DOB:  [birth year]  Subjective: Nicholas Wilkerson is an 80 y.o. year old male who is a primary patient of Laurey Morale, MD.  The CCM team was consulted for assistance with disease management and care coordination needs.    Engaged with patient by telephone for follow up visit in response to provider referral for pharmacy case management and/or care coordination services.   Consent to Services:  The patient was given information about Chronic Care Management services, agreed to services, and gave verbal consent prior to initiation of services.  Please see initial visit note for detailed documentation.   Patient Care Team: Laurey Morale, MD as PCP - General (Family Medicine) [name], MD as PCP - Electrophysiology (Cardiology) Heath Lark, MD as Consulting Physician (Hematology and Oncology) Melissa Montane, MD as Consulting Physician (Otolaryngology) Eppie Gibson, MD as Attending Physician (Radiation Oncology) Holley Bouche, NP (Inactive) as Nurse Practitioner (Nurse Practitioner) [name], [name], MD as Consulting Physician (Cardiology) [place], Heather, [place] (Optometry) Viona Gilmore, [facility] as Pharmacist (Pharmacist)  Recent office visits: 10/30/20 Colin Benton, DO: Patient presented for video visit for sinus congestion/possible COVID infection.   10/15/20 Alysia Penna, MD: Patient presented for annual exam. Influenza vaccine administered. A1c increased.    Recent consult visits: 01/20/21 Allegra Lai, MD (cardiology): Patient presented for electrophysiology evaluation. Plan to proceed with ICD implant.  01/13/21 Charlott Holler (dentist): Patient presented for oral hygiene exam.  12/08/20 Heather [place] (optometry): Unable to access notes.  11/19/20 Peter [name], MD (cardiology): Patient presented for CHF follow up. Prescribed Jardiance 10 mg daily.  10/07/20 Raquel Rodriguez-Guzman, CPP  (cardiology): Patient presented for med titration. Applied for Jardiance PAP.  09/03/20 Peter [name], MD (Cardiology): Patient presented for HF follow up. Jardiance d/c'd and spironolactone started. Plan for BMET in 1 week and follow up with pharmacist in 1 month.  08/12/20 Raquel Rodriguez-Guzman, RPH-CPP (cardiology): Patient presented for medication management. Received patient assistance application for Entresto.  07/29/20 Elder Cyphers, RPH-CPP (cardiology): Patient presented for medication management. Entresto increased to 49/51 mg BID.  Hospital visits: None in previous 6 months  Objective:  Lab Results  Component Value Date   CREATININE 1.27 10/07/2020   BUN 21 10/07/2020   GFR 59.42 (L) 10/15/2019   GFRNONAA 54 (L) 10/07/2020   GFRAA 63 10/07/2020   NA 139 10/07/2020   K 4.5 10/07/2020   CALCIUM 9.7 10/07/2020   CO2 27 10/07/2020   GLUCOSE 101 (H) 10/07/2020    Lab Results  Component Value Date/Time   HGBA1C 6.3 10/15/2020 11:51 AM   HGBA1C 5.5 05/27/2020 11:38 AM   GFR 59.42 (L) 10/15/2019 10:58 AM   GFR 70.83 09/29/2018 09:53 AM    Last diabetic Eye exam: No results found for: HMDIABEYEEXA  Last diabetic Foot exam: No results found for: HMDIABFOOTEX   Lab Results  Component Value Date   CHOL 184 07/15/2020   HDL 36 (L) 07/15/2020   LDLCALC 121 (H) 07/15/2020   TRIG 153 (H) 07/15/2020   CHOLHDL 5.1 (H) 07/15/2020    Hepatic Function Latest Ref Rng & Units 07/15/2020 10/15/2019 09/29/2018  Total Protein 6.0 - 8.5 g/dL 6.7 6.3 6.5  Albumin 3.7 - 4.7 g/dL 4.7 4.3 4.4  AST 0 - 40 IU/L 20 16 17   ALT 0 - 44 IU/L 21 16 19   Alk Phosphatase 44 - 121 IU/L 85 69 63  Total Bilirubin 0.0 -  1.2 mg/dL 0.5 0.6 0.6  Bilirubin, Direct 0.0 - 0.3 mg/dL - 0.1 0.1    Lab Results  Component Value Date/Time   TSH 1.89 10/15/2020 11:51 AM   TSH 1.45 05/27/2020 11:38 AM   FREET4 1.02 10/15/2020 11:51 AM   FREET4 1.4 05/27/2020 11:38 AM    CBC Latest Ref Rng & Units  07/22/2020 07/22/2020 07/15/2020  WBC 3.4 - 10.8 x10E3/uL - - 6.4  Hemoglobin 13.0 - 17.0 g/dL 18.0(H) 18.0(H) 17.0  Hematocrit 39.0 - 52.0 % 53.0(H) 53.0(H) 51.2(H)  Platelets 150 - 450 x10E3/uL - - 208    No results found for: VD25OH  Clinical ASCVD: Yes  The 10-year ASCVD risk score Mikey Bussing DC Jr., et al., 2013) is: 21.1%   Values used to calculate the score:     Age: 67 years     Sex: Male     Is Non-Hispanic African American: No     Diabetic: No     Tobacco smoker: No     Systolic Blood Pressure: 94 mmHg     Is BP treated: Yes     HDL Cholesterol: 36 mg/dL     Total Cholesterol: 184 mg/dL    Depression screen [facility] 2/9 10/15/2020 05/26/2020 10/24/2018  Decreased Interest 0 0 0  Down, Depressed, Hopeless 0 0 0  PHQ - 2 Score 0 0 0  Altered sleeping - 0 0  Tired, decreased energy - 0 0  Change in appetite - 0 0  Feeling bad or failure about yourself  - 0 0  Trouble concentrating - 0 0  Moving slowly or fidgety/restless - 0 0  Suicidal thoughts - 0 0  PHQ-9 Score - 0 0  Difficult doing work/chores - Not difficult at all -  Some recent data might be hidden      Social History   Tobacco Use  Smoking Status Never Smoker  Smokeless Tobacco Never Used   BP Readings from Last 3 Encounters:  01/20/21 94/68  11/19/20 106/72  10/15/20 118/68   Pulse Readings from Last 3 Encounters:  01/20/21 77  11/19/20 64  10/15/20 65   Wt Readings from Last 3 Encounters:  01/20/21 162 lb (73.5 kg)  11/19/20 158 lb 6.4 oz (71.8 kg)  10/15/20 157 lb 6.4 oz (71.4 kg)   BMI Readings from Last 3 Encounters:  01/20/21 23.92 kg/m  11/19/20 23.39 kg/m  10/15/20 23.93 kg/m    Assessment/Interventions: Review of patient past medical history, allergies, medications, health status, including review of consultants reports, laboratory and other test data, was performed as part of comprehensive evaluation and provision of chronic care management services.   SDOH:  (Social Determinants of  Health) assessments and interventions performed: No  SDOH Screenings   Alcohol Screen: Low Risk   . Last Alcohol Screening Score (AUDIT): 1  Depression (PHQ2-9): Low Risk   . PHQ-2 Score: 0  Financial Resource Strain: Medium Risk  . Difficulty of Paying Living Expenses: Somewhat hard  Food Insecurity: No Food Insecurity  . Worried About Charity fundraiser in the Last Year: Never true  . Ran Out of Food in the Last Year: Never true  Housing: Low Risk   . Last Housing Risk Score: 0  Physical Activity: Inactive  . Days of Exercise per Week: 0 days  . Minutes of Exercise per Session: 0 min  Social Connections: Moderately Integrated  . Frequency of Communication with Friends and Family: More than three times a week  . Frequency of Social  Gatherings with Friends and Family: More than three times a week  . Attends Religious Services: More than 4 times per year  . Active Member of Clubs or Organizations: No  . Attends Archivist Meetings: Never  . Marital Status: Married  Stress: No Stress Concern Present  . Feeling of Stress : Not at all  Tobacco Use: Low Risk   . Smoking Tobacco Use: Never Smoker  . Smokeless Tobacco Use: Never Used  Transportation Needs: No Transportation Needs  . Lack of Transportation (Medical): No  . Lack of Transportation (Non-Medical): No    CCM Care Plan  No Known Allergies  Medications Reviewed Today    Reviewed by [name], MD (Physician) on 01/20/21 at 1035  Med List Status: <None>  Medication Order Taking? Sig Documenting Provider Last Dose Status Informant  aspirin EC 81 MG tablet [id number] Yes Take 81 mg by mouth daily. [provider] Taking Active Self  atorvastatin (LIPITOR) 40 MG tablet [id number] Yes Take 1 tablet (40 mg total) by mouth daily. Laurey Morale, MD Taking Active   B Complex-C (B-COMPLEX WITH VITAMIN C) tablet [id number] Yes Take 1 tablet by mouth daily. [provider] Taking Active Self   brimonidine (ALPHAGAN) 0.2 % ophthalmic solution [id number] Yes Place 1 drop into both eyes at bedtime.  [provider] Taking Active Self  carvedilol (COREG) 25 MG tablet [id number] Yes Take 1 tablet (25 mg total) by mouth 2 (two) times daily. [name], [name], MD Taking Active Self  empagliflozin (JARDIANCE) 10 MG TABS tablet [id number] Yes Take 1 tablet (10 mg total) by mouth daily before breakfast. [name], [name], MD Taking Active   fluticasone [facility]) 50 MCG/ACT nasal spray [id number] Yes Place 2 sprays into both nostrils daily. Laurey Morale, MD Taking Active   furosemide (LASIX) 40 MG tablet [id number] Yes Take 1 tablet (40 mg total) by mouth daily. [name], [name], MD Taking Active   latanoprost (XALATAN) 0.005 % ophthalmic solution [id number] Yes Place 1 drop into both eyes at bedtime.  [provider] Taking Active Self  levothyroxine (SYNTHROID) 75 MCG tablet [id number] Yes Take 1 tablet by mouth once daily Laurey Morale, MD Taking Active   Melatonin 10 MG TABS [id number] Yes Take 10 mg by mouth at bedtime.  [provider] Taking Active Self  Multiple Vitamin (MULTIVITAMIN WITH MINERALS) TABS tablet [id number] Yes Take 1 tablet by mouth daily. [provider] Taking Active Self  sacubitril-valsartan (ENTRESTO) 49-51 MG [id number] Yes Take 1 tablet by mouth 2 (two) times daily. [name], [name], MD Taking Active   sodium fluoride (FLUORISHIELD) 1.1 % GEL dental gel [id number] Yes Instill one drop of gel per tooth space of fluoride tray. Place over teeth for 5 minutes. Remove. Spit out excess. Repeat nightly.  Patient taking differently: Place 1 application onto teeth at bedtime. Instill one drop of gel per tooth space of fluoride tray. Place over teeth for 5 minutes. Remove. Spit out excess. Repeat nightly.   Lenn Cal, DDS Taking Active Self  sodium fluoride (SODIUM FLUORIDE 5000 PPM) 1.1 % GEL dental gel [id number] Yes Place 1 application onto teeth at  bedtime. Sandi Mariscal B, DMD Taking Active   spironolactone (ALDACTONE) 25 MG tablet [id number] Yes Take 1 tablet (25 mg total) by mouth daily. [name], [name], MD Taking Active   tamsulosin [facility]) 0.4 MG CAPS capsule [id number] Yes Take 1 capsule by mouth once daily Laurey Morale, MD Taking Active  Patient Active Problem List   Diagnosis Date Noted  . Elevated PSA 02/28/2020  . Hypothyroidism 12/13/2019  . Nonischemic cardiomyopathy ([place]) 09/28/2019  . Personal history of malignant neoplasm of tongue 12/15/2015  . Insomnia 06/11/2015  . Skin infection at gastrostomy tube site ([place]) 06/09/2015  . Depression, acute 03/24/2015  . Palpitations 01/24/2015  . History of skin cancer 12/03/2014  . Cancer of base of tongue ([place]) 12/02/2014  . Acute on chronic systolic CHF (congestive heart failure) ([place]) 07/12/2013  . MUSCLE STRAIN, ABDOMINAL WALL 11/28/2008  . ARTHRITIS 10/22/2008  . BURSITIS, LEFT SHOULDER 07/24/2008  . INTERNAL HEMORRHOIDS 12/08/2007  . EXTERNAL HEMORRHOIDS 12/08/2007  . DIVERTICULOSIS, COLON 12/08/2007  . RECTAL BLEEDING 12/08/2007  . History of cardiovascular disorder 12/08/2007  . Congestive dilated cardiomyopathy ([place]) 06/13/2007  . HX, PERSONAL, ARTHRITIS 06/13/2007  . Hypercholesterolemia 05/15/2007  . Essential hypertension 05/15/2007    Immunization History  Administered Date(s) Administered  . Fluad Quad(high Dose 65+) 10/15/2019, 10/15/2020  . Influenza Split 08/11/2011, 08/31/2012  . Influenza Whole 08/18/2007, 07/11/2008, 08/12/2009, 06/25/2010  . Influenza, High Dose Seasonal PF 07/31/2013, 07/01/2016, 07/18/2017, 08/09/2018  . Influenza,inj,Quad PF,6+ Mos 07/31/2014, 06/13/2015  . Janssen (J&J) SARS-COV-2 Vaccination 02/09/2020  . Pneumococcal Conjugate-13 09/23/2016  . Pneumococcal Polysaccharide-23 06/19/2009  . Td 06/27/2008  . Zoster 07/11/2008  . Zoster Recombinat (Shingrix) 02/21/2018   Patient has not had any shortness of  breath and has been active doing work inside and outside the house. He just redid his porch and was on his hands and knees for half a day and stained the porch.   Conditions to be addressed/monitored:  Hypertension, Hyperlipidemia, Heart Failure, Hypothyroidism, Osteoarthritis and Glaucoma, Insomnia  Care Plan : [place]  Updates made by Viona Gilmore, [place] since 02/02/2021 12:00 AM    Problem: Problem: Hypertension, Hyperlipidemia, Heart Failure, Hypothyroidism, Osteoarthritis and Glaucoma, Insomnia     Long-Range Goal: Patient-Specific Goal   Start Date: 01/28/2021  Expected End Date: 01/28/2022  This Visit's Progress: On track  Priority: High  Note:   Current Barriers:  . Unable to independently afford treatment regimen . Unable to independently monitor therapeutic efficacy  Pharmacist Clinical Goal(s):  Marland Kitchen Patient will achieve adherence to monitoring guidelines and medication adherence to achieve therapeutic efficacy . maintain control of blood pressure as evidenced by home blood pressure readings  through collaboration with PharmD and provider.   Interventions: . 1:1 collaboration with Laurey Morale, MD regarding development and update of comprehensive plan of care as evidenced by provider attestation and co-signature . Inter-disciplinary care team collaboration (see longitudinal plan of care) . Comprehensive medication review performed; medication list updated in electronic medical record  Hypertension (BP goal <130/80) -Controlled -Current treatment:  Carvedilol 12m, 1 tablet twice daily   Entresto 49/51 mg, 1 tablet twice daily  Spironolactone 25 mg 1 tablet daily -Medications previously tried: ramipril (low BP)    -Current home readings: 95-100/68 (2-3 times a week) -Current dietary habits: did not discuss -Current exercise habits: patient plays golf and remains active throughout the day -Denies hypotensive/hypertensive symptoms -Educated on Exercise  goal of 150 minutes per week; Importance of home blood pressure monitoring; Proper BP monitoring technique; -Counseled to monitor BP at home weekly, document, and provide log at future appointments -Counseled on diet and exercise extensively Recommended to continue current medication Educated on importance of taking carvedilol with food  Hyperlipidemia: (LDL goal < 70) -Uncontrolled -Current treatment: . Atorvastatin 40 mg 1 tablet daily -Medications previously  tried: simvastatin (unknown)  -Current dietary patterns: Patient notes avoids fried food and aim to eat baked fish. Avoids junk food. -Current exercise habits: active throughout the day -Educated on Cholesterol goals;  Benefits of statin for ASCVD risk reduction; Importance of limiting foods high in cholesterol; Exercise goal of 150 minutes per week; -Counseled on diet and exercise extensively Recommended to continue current medication Recommended repeat lipid panel since restarting atorvastatin and reached out to cardiology.  CAD (Goal: prevent heart attacks and strokes) -Controlled -Current treatment  . Atorvastatin 40 mg 1 tablet daily  Aspirin 78m, 1 tablet daily -Medications previously tried: none  -Recommended to continue current medication  Pre-diabetes (A1c goal <6.5%) -Controlled -Current medications: . Jardiance 10 mg 1 tablet daily -Medications previously tried: none  -Current home glucose readings . fasting glucose: does not check . post prandial glucose: does not check -Denies hypoglycemic/hyperglycemic symptoms -Current meal patterns:  . breakfast: did not discuss . lunch: did not discuss  . dinner: did not discuss . snacks: did not discuss . drinks: did not discuss -Current exercise: active throughout the day -Educated on A1c and blood sugar goals; Exercise goal of 150 minutes per week; Carbohydrate counting and/or plate method -Counseled to check feet daily and get yearly eye  exams -Counseled on diet and exercise extensively Patient reported he ate "a lot of stuff he shouldn't" before this was checked and has improve since then  Heart Failure (Goal: manage symptoms and prevent exacerbations) -Controlled -Last ejection fraction: 20-25% (Date: 07/2020) -HF type: Systolic -NYHA Class: II (slight limitation of activity) -AHA HF Stage: C (Heart disease and symptoms present) -Current treatment:  Carvedilol 271m 1 tablet twice daily   Entresto 49/51 mg, 1 tablet twice daily  Spironolactone 25 mg 1 tablet daily  Jardiance 10 mg 1 tablet daily  Furosemide 40 mg 1 tablet daily -Medications previously tried: ramipril (low BP) -Current home BP/HR readings: lower normal range -Current dietary habits: limiting salt intake -Current exercise habits: stays active -Educated on Benefits of medications for managing symptoms and prolonging life Importance of weighing daily; if you gain more than 3 pounds in one day or 5 pounds in one week, call cardiologist -Counseled on diet and exercise extensively Recommended to continue current medication  Hypothyroidism (Goal: TSH 0.35-4.5) -Controlled -Current treatment  . levothyroxine 7519m 1 tablet once daily -Medications previously tried: none  -Recommended to continue current medication  Insomnia (Goal: improve quality and quantity of sleep) -Controlled -Current treatment  . melatonin 35m54m tablet at night -Medications previously tried: none  -Recommended to continue current medication  BPH (Goal: minimize symptoms) -Controlled -Current treatment  . Tamsulosin 0.4mg,25mcapsule daily -Medications previously tried: none  -Recommended to continue current medication  Glaucoma (Goal: lower intraocular pressure) -Controlled -Current treatment  . Brimodine use as directed . Latanoprost 1 drop at bedtime -Medications previously tried: none  -Recommended to continue current medication  Health  Maintenance -Vaccine gaps: tetanus, COVID booster, second dose of shingrix -Current therapy:  . VitMarland Kitchenmin B complex daily -Educated on Cost vs benefit of each product must be carefully weighed by individual consumer -Patient is satisfied with current therapy and denies issues -Recommended to continue current medication  Patient Goals/Self-Care Activities . Patient will:  - take medications as prescribed check blood pressure weekly, document, and provide at future appointments weigh daily, and contact provider if weight gain of > 3 lbs in one day or > 5 lbs in one week  Follow Up Plan: Telephone follow up appointment  with care management team member scheduled for: 4 months      Medication Assistance: [name] obtained through [employer] medication assistance program.  Enrollment ends 10/10/21  Patient's preferred pharmacy is:  [facility] ([facility] [place] [address] [place] ([place])  [address] Phone: [phone] Fax: [phone]  [place]. [place] [address] Phone: [phone] Fax: [phone]  Uses pill box? Yes Pt endorses 100% compliance  We discussed: Current pharmacy is preferred with insurance plan and patient is satisfied with pharmacy services Patient decided to: Continue current medication management strategy  Care Plan and Follow Up Patient Decision:  Patient agrees to Care Plan and Follow-up.  Plan: Telephone follow up appointment with care management team member scheduled for:  4 months  [name], PharmD [place] Pharmacist [place] at [place] [phone]

## 2021-02-02 NOTE — Patient Instructions (Addendum)
Hi Nicholas Wilkerson,  As always, it was great talking with you! Below is a summary of some of the topics we discussed.   I also wanted to remind you to take your carvedilol with food and that may help with some of the dizziness. Keep up the good work with checking your blood pressure at home!  Please reach out to me if you have any questions or need anything before our follow up!  Best, Maddie  [name], PharmD, [facility]  Visit Information  Goals Addressed   None    Patient Care Plan: CCM Pharmacy Care Plan    Problem Identified: Problem: Hypertension, Hyperlipidemia, Heart Failure, Hypothyroidism, Osteoarthritis and Glaucoma, Insomnia     Long-Range Goal: Patient-Specific Goal   Start Date: 01/28/2021  Expected End Date: 01/28/2022  This Visit's Progress: On track  Priority: High  Note:   Current Barriers:  . Unable to independently afford treatment regimen . Unable to independently monitor therapeutic efficacy  Pharmacist Clinical Goal(s):  Marland Kitchen Patient will achieve adherence to monitoring guidelines and medication adherence to achieve therapeutic efficacy . maintain control of blood pressure as evidenced by home blood pressure readings  through collaboration with PharmD and provider.   Interventions: . 1:1 collaboration with Laurey Morale, MD regarding development and update of comprehensive plan of care as evidenced by provider attestation and co-signature . Inter-disciplinary care team collaboration (see longitudinal plan of care) . Comprehensive medication review performed; medication list updated in electronic medical record  Hypertension (BP goal <130/80) -Controlled -Current treatment:  Carvedilol 25mg , 1 tablet twice daily   Entresto 49/51 mg, 1 tablet twice daily  Spironolactone 25 mg 1 tablet daily -Medications previously tried: ramipril (low BP)    -Current home readings: 95-100/68 (2-3 times a  week) -Current dietary habits: did not discuss -Current exercise habits: patient plays golf and remains active throughout the day -Denies hypotensive/hypertensive symptoms -Educated on Exercise goal of 150 minutes per week; Importance of home blood pressure monitoring; Proper BP monitoring technique; -Counseled to monitor BP at home weekly, document, and provide log at future appointments -Counseled on diet and exercise extensively Recommended to continue current medication Educated on importance of taking carvedilol with food  Hyperlipidemia: (LDL goal < 70) -Uncontrolled -Current treatment: . Atorvastatin 40 mg 1 tablet daily -Medications previously tried: simvastatin (unknown)  -Current dietary patterns: Patient notes avoids fried food and aim to eat baked fish. Avoids junk food. -Current exercise habits: active throughout the day -Educated on Cholesterol goals;  Benefits of statin for ASCVD risk reduction; Importance of limiting foods high in cholesterol; Exercise goal of 150 minutes per week; -Counseled on diet and exercise extensively Recommended to continue current medication Recommended repeat lipid panel since restarting atorvastatin and reached out to cardiology.  CAD (Goal: prevent heart attacks and strokes) -Controlled -Current treatment  . Atorvastatin 40 mg 1 tablet daily  Aspirin 81mg , 1 tablet daily -Medications previously tried: none  -Recommended to continue current medication  Pre-diabetes (A1c goal <6.5%) -Controlled -Current medications: . Jardiance 10 mg 1 tablet daily -Medications previously tried: none  -Current home glucose readings . fasting glucose: does not check . post prandial glucose: does not check -Denies hypoglycemic/hyperglycemic symptoms -Current meal patterns:  . breakfast: did not discuss . lunch: did not discuss  . dinner: did not discuss . snacks: did not discuss . drinks: did not discuss -Current exercise: active throughout  the day -Educated on A1c and blood sugar goals; Exercise goal of  150 minutes per week; Carbohydrate counting and/or plate method -Counseled to check feet daily and get yearly eye exams -Counseled on diet and exercise extensively Patient reported he ate "a lot of stuff he shouldn't" before this was checked and has improve since then  Heart Failure (Goal: manage symptoms and prevent exacerbations) -Controlled -Last ejection fraction: 20-25% (Date: 07/2020) -HF type: Systolic -NYHA Class: II (slight limitation of activity) -AHA HF Stage: C (Heart disease and symptoms present) -Current treatment:  Carvedilol 25mg , 1 tablet twice daily   Entresto 49/51 mg, 1 tablet twice daily  Spironolactone 25 mg 1 tablet daily  Jardiance 10 mg 1 tablet daily  Furosemide 40 mg 1 tablet daily -Medications previously tried: ramipril (low BP) -Current home BP/HR readings: lower normal range -Current dietary habits: limiting salt intake -Current exercise habits: stays active -Educated on Benefits of medications for managing symptoms and prolonging life Importance of weighing daily; if you gain more than 3 pounds in one day or 5 pounds in one week, call cardiologist -Counseled on diet and exercise extensively Recommended to continue current medication  Hypothyroidism (Goal: TSH 0.35-4.5) -Controlled -Current treatment  . levothyroxine 69mcg, 1 tablet once daily -Medications previously tried: none  -Recommended to continue current medication  Insomnia (Goal: improve quality and quantity of sleep) -Controlled -Current treatment  . melatonin 10mg , 1 tablet at night -Medications previously tried: none  -Recommended to continue current medication  BPH (Goal: minimize symptoms) -Controlled -Current treatment  . Tamsulosin 0.4mg , 1 capsule daily -Medications previously tried: none  -Recommended to continue current medication  Glaucoma (Goal: lower intraocular pressure) -Controlled -Current  treatment  . Brimodine use as directed . Latanoprost 1 drop at bedtime -Medications previously tried: none  -Recommended to continue current medication  Health Maintenance -Vaccine gaps: tetanus, COVID booster, second dose of shingrix -Current therapy:  Marland Kitchen Vitamin B complex daily -Educated on Cost vs benefit of each product must be carefully weighed by individual consumer -Patient is satisfied with current therapy and denies issues -Recommended to continue current medication  Patient Goals/Self-Care Activities . Patient will:  - take medications as prescribed check blood pressure weekly, document, and provide at future appointments weigh daily, and contact provider if weight gain of > 3 lbs in one day or > 5 lbs in one week  Follow Up Plan: Telephone follow up appointment with care management team member scheduled for: 4 months       Patient verbalizes understanding of instructions provided today and agrees to view in [place].  Telephone follow up appointment with pharmacy team member scheduled for: 4 months  Viona Gilmore, [facility]  How to Take Your Blood Pressure Blood pressure measures how strongly your blood is pressing against the walls of your arteries. Arteries are blood vessels that carry blood from your heart throughout your body. You can take your blood pressure at home with a machine. You may need to check your blood pressure at home:  To check if you have high blood pressure (hypertension).  To check your blood pressure over time.  To make sure your blood pressure medicine is working. Supplies needed:  Blood pressure machine, or monitor.  Dining room chair to sit in.  Table or desk.  Small notebook.  Pencil or pen. How to prepare Avoid these things for 30 minutes before checking your blood pressure:  Having drinks with caffeine in them, such as coffee or tea.  Drinking alcohol.  Eating.  Smoking.  Exercising. Do these things five minutes before  checking  your blood pressure:  Go to the bathroom and pee (urinate).  Sit in a dining chair. Do not sit in a soft couch or an armchair.  Be quiet. Do not talk. How to take your blood pressure Follow the instructions that came with your machine. If you have a digital blood pressure monitor, these may be the instructions: 1. Sit up straight. 2. Place your feet on the floor. Do not cross your ankles or legs. 3. Rest your left arm at the level of your heart. You may rest it on a table, desk, or chair. 4. Pull up your shirt sleeve. 5. Wrap the blood pressure cuff around the upper part of your left arm. The cuff should be 1 inch (2.5 cm) above your elbow. It is best to wrap the cuff around bare skin. 6. Fit the cuff snugly around your arm. You should be able to place only one finger between the cuff and your arm. 7. Place the cord so that it rests in the bend of your elbow. 8. Press the power button. 9. Sit quietly while the cuff fills with air and loses air. 10. Write down the numbers on the screen. 11. Wait 2-3 minutes and then repeat steps 1-10.   What do the numbers mean? Two numbers make up your blood pressure. The first number is called systolic pressure. The second is called diastolic pressure. An example of a blood pressure reading is "120 over 80" (or 120/80). If you are an adult and do not have a medical condition, use this guide to find out if your blood pressure is normal: Normal  First number: below 120.  Second number: below 80. Elevated  First number: 120-129.  Second number: below 80. Hypertension stage 1  First number: 130-139.  Second number: 80-89. Hypertension stage 2  First number: 140 or above.  Second number: 52 or above. Your blood pressure is above normal even if only the top or bottom number is above normal. Follow these instructions at home:  Check your blood pressure as often as your doctor tells you to.  Check your blood pressure at the same time  every day.  Take your monitor to your next doctor's appointment. Your doctor will: ? Make sure you are using it correctly. ? Make sure it is working right.  Make sure you understand what your blood pressure numbers should be.  Tell your doctor if your medicine is causing side effects.  Keep all follow-up visits as told by your doctor. This is important. General tips:  You will need a blood pressure machine, or monitor. Your doctor can suggest a monitor. You can buy one at a drugstore or online. When choosing one: ? Choose one with an arm cuff. ? Choose one that wraps around your upper arm. Only one finger should fit between your arm and the cuff. ? Do not choose one that measures your blood pressure from your wrist or finger. Where to find more information American Heart Association: www.heart.org Contact a doctor if:  Your blood pressure keeps being high. Get help right away if:  Your first blood pressure number is higher than 180.  Your second blood pressure number is higher than 120. Summary  Check your blood pressure at the same time every day.  Avoid caffeine, alcohol, smoking, and exercise for 30 minutes before checking your blood pressure.  Make sure you understand what your blood pressure numbers should be. This information is not intended to replace advice given to you by  your health care provider. Make sure you discuss any questions you have with your health care provider. Document Revised: 09/21/2019 Document Reviewed: 09/21/2019 Elsevier Patient Education  2021 [employer].

## 2021-02-06 ENCOUNTER — Telehealth: Payer: Self-pay | Admitting: *Deleted

## 2021-02-06 NOTE — Telephone Encounter (Signed)
Verbally made pt aware to arrive at 12:30 pm on 03/04/21 for ICD implant, NOT 9:30 am as originally instructed. Will send information via my chart per pt request. Patient verbalized understanding and agreeable to plan.

## 2021-02-09 ENCOUNTER — Other Ambulatory Visit: Payer: Self-pay

## 2021-02-09 DIAGNOSIS — I420 Dilated cardiomyopathy: Secondary | ICD-10-CM

## 2021-02-09 DIAGNOSIS — E7800 Pure hypercholesterolemia, unspecified: Secondary | ICD-10-CM

## 2021-02-09 NOTE — Progress Notes (Signed)
Lipid

## 2021-02-10 ENCOUNTER — Other Ambulatory Visit: Payer: Self-pay

## 2021-02-10 DIAGNOSIS — I420 Dilated cardiomyopathy: Secondary | ICD-10-CM

## 2021-02-10 DIAGNOSIS — E7800 Pure hypercholesterolemia, unspecified: Secondary | ICD-10-CM

## 2021-02-11 ENCOUNTER — Telehealth: Payer: Self-pay | Admitting: Cardiology

## 2021-02-11 ENCOUNTER — Telehealth: Payer: Self-pay | Admitting: Family Medicine

## 2021-02-11 DIAGNOSIS — Z01812 Encounter for preprocedural laboratory examination: Secondary | ICD-10-CM

## 2021-02-11 DIAGNOSIS — I5022 Chronic systolic (congestive) heart failure: Secondary | ICD-10-CM

## 2021-02-11 NOTE — Telephone Encounter (Signed)
D/w pt he will be in fasting tomorrow for PJ and Camn[place] labs

## 2021-02-11 NOTE — Telephone Encounter (Signed)
Patient is calling and is requesting a referral to go to the Outpatient Cancer Therapy center on [facility] for neck pain, please advise advise. CB is [phone]

## 2021-02-11 NOTE — Telephone Encounter (Signed)
Follow Up:    Pt is retuning Cheryl's call from yesterday. He think it was something about lab work .

## 2021-02-12 DIAGNOSIS — I420 Dilated cardiomyopathy: Secondary | ICD-10-CM | POA: Diagnosis not present

## 2021-02-12 DIAGNOSIS — E7800 Pure hypercholesterolemia, unspecified: Secondary | ICD-10-CM | POA: Diagnosis not present

## 2021-02-12 DIAGNOSIS — I5022 Chronic systolic (congestive) heart failure: Secondary | ICD-10-CM | POA: Diagnosis not present

## 2021-02-12 DIAGNOSIS — Z01812 Encounter for preprocedural laboratory examination: Secondary | ICD-10-CM | POA: Diagnosis not present

## 2021-02-12 LAB — CBC
Hematocrit: 46.5 % (ref 37.5–51.0)
Hemoglobin: 15.6 g/dL (ref 13.0–17.7)
MCH: 31.1 pg (ref 26.6–33.0)
MCHC: 33.5 g/dL (ref 31.5–35.7)
MCV: 93 fL (ref 79–97)
Platelets: 209 10*3/uL (ref 150–450)
RBC: 5.02 x10E6/uL (ref 4.14–5.80)
RDW: 13.4 % (ref 11.6–15.4)
WBC: 7.9 10*3/uL (ref 3.4–10.8)

## 2021-02-12 LAB — LIPID PANEL
Chol/HDL Ratio: 2.6 ratio (ref 0.0–5.0)
Cholesterol, Total: 106 mg/dL (ref 100–199)
HDL: 41 mg/dL (ref >39)
LDL Chol Calc (NIH): 52 mg/dL (ref 0–99)
Triglycerides: 57 mg/dL (ref 0–149)
VLDL Cholesterol Cal: 13 mg/dL (ref 5–40)

## 2021-02-12 LAB — BASIC METABOLIC PANEL
BUN/Creatinine Ratio: 21 (ref 10–24)
BUN: 31 mg/dL — ABNORMAL HIGH (ref 8–27)
CO2: 23 mmol/L (ref 20–29)
Calcium: 9.7 mg/dL (ref 8.6–10.2)
Chloride: 101 mmol/L (ref 96–106)
Creatinine, Ser: 1.48 mg/dL — ABNORMAL HIGH (ref 0.76–1.27)
Glucose: 97 mg/dL (ref 65–99)
Potassium: 5.2 mmol/L (ref 3.5–5.2)
Sodium: 139 mmol/L (ref 134–144)
eGFR: 48 mL/min/{1.73_m2} — ABNORMAL LOW (ref >59)

## 2021-02-12 NOTE — Progress Notes (Signed)
Nicholas Wilkerson Date of Birth: 08/29/1941   History of Present Illness: Nicholas Wilkerson is seen for followup. He has a history of dilated cardiomyopathy with initial ejection fraction of 30% over 18 years ago. Subsequent evaluation has demonstrated improvement to 50-55%. Last Echo was in January 2018 showing decrease in EF to 35-40%. ACEi was resumed at that time. . 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 He was treated with RT and chemo.   Last November he complained of some palpitations. Event monitor short short bursts of SVT and NSVT. No bradycardia or pauses. We increased his Coreg dose at that time.  He was seen in February and he noted he had extensive Urologic evaluation for an elevated PSA with no cancer found. He did complain of getting tired real easy with activity.  No edema. No chest pain. He does note some change in his breathing.  We updated an Echocardiogram which demonstrated significant decrease in LV function with  EF down to 20-25%. There was also evidence of significant pulmonary HTN.   We performed cardiac cath showing nonobstructive CAD. Moderately elevated LV filling pressures. Moderate pulmonary HTN.  He was started on Lasix 40 mg daily. Continued Coreg and Jardiance. Added Entresto. Now up to 49/51 mg bid. Added aldactone 25 mg daily. He is tolerating this well. He has noted that if he takes everything at once in the morning he gets real lightheaded. He states this is much better.   Still active walking and playing golf.   After optimization of medication Echo was repeated and was unchanged with EF 20-25%. He was seen by Dr Curt Bears with plans for ICD implant later in May.  He continues to feel well and notes no cardiac limitations.  Current Outpatient Medications on File Prior to Visit  Medication Sig Dispense Refill  . aspirin EC 81 MG tablet Take 81 mg by mouth daily.    Marland Kitchen atorvastatin (LIPITOR) 40 MG tablet Take 1 tablet (40 mg total) by mouth daily.  90 tablet 3  . B Complex-C (B-COMPLEX WITH VITAMIN C) tablet Take 1 tablet by mouth daily.    . brimonidine (ALPHAGAN) 0.2 % ophthalmic solution Place 1 drop into both eyes at bedtime.     . carvedilol (COREG) 25 MG tablet Take 1 tablet (25 mg total) by mouth 2 (two) times daily. 180 tablet 3  . empagliflozin (JARDIANCE) 10 MG TABS tablet Take 1 tablet (10 mg total) by mouth daily before breakfast. 90 tablet 3  . fluticasone (FLONASE) 50 MCG/ACT nasal spray Place 2 sprays into both nostrils daily. 16 g 11  . furosemide (LASIX) 40 MG tablet Take 1 tablet (40 mg total) by mouth daily. 90 tablet 3  . latanoprost (XALATAN) 0.005 % ophthalmic solution Place 1 drop into both eyes at bedtime.     Marland Kitchen levothyroxine (SYNTHROID) 75 MCG tablet Take 1 tablet by mouth once daily 90 tablet 0  . Melatonin 10 MG TABS Take 10 mg by mouth at bedtime.     . Multiple Vitamin (MULTIVITAMIN WITH MINERALS) TABS tablet Take 1 tablet by mouth daily.    . sacubitril-valsartan (ENTRESTO) 49-51 MG Take 1 tablet by mouth 2 (two) times daily. 180 tablet 3  . sodium fluoride (FLUORISHIELD) 1.1 % GEL dental gel Instill one drop of gel per tooth space of fluoride tray. Place over teeth for 5 minutes. Remove. Spit out excess. Repeat nightly. (Patient taking differently: Place 1 application onto teeth at bedtime. Instill  one drop of gel per tooth space of fluoride tray. Place over teeth for 5 minutes. Remove. Spit out excess. Repeat nightly.) 120 mL prn  . sodium fluoride (FLUORISHIELD) 1.1 % GEL dental gel PLACE 1 APPLICATION ONTO THE TEETH AT BEDTIME 112 mL 11  . sodium fluoride (SODIUM FLUORIDE 5000 PPM) 1.1 % GEL dental gel Place 1 application onto teeth at bedtime. 120 mL 11  . spironolactone (ALDACTONE) 25 MG tablet Take 1 tablet (25 mg total) by mouth daily. 90 tablet 3  . tamsulosin (FLOMAX) 0.4 MG CAPS capsule Take 1 capsule by mouth once daily 90 capsule 0   No current facility-administered medications on file prior to  visit.    No Known Allergies  Past Medical History:  Diagnosis Date  . ARTHRITIS 10/22/2008  . BPH (benign prostatic hyperplasia)   . CHF (congestive heart failure) [facility])    sees Dr. Nastasha Reising [name]   . Dilated cardiomyopathy ([place])   . DIVERTICULOSIS, COLON 12/08/2007  . Glaucoma    sees Dr. Heather [place]   . H/O asbestos exposure   . History of echocardiogram 05/22/2007   EF was 45-50% / Mild concentric LV hypertrophy with mild global hypokinesis and overall mild systolic dysfunction .  Mild AV sclerosis / Mild Mitral insufficiency / compared to prior study 04/24/02 -- LV function has improved further.    . History of kidney stones   . Hypercholesterolemia   . Hypertension   . Insomnia 06/11/2015  . Radiation 01/01/15-02/18/15   base of tongue and bilateral neck 70 Gy  . Skin cancer    squamous cell, basal cell  . Squamous cell carcinoma 11/20/14   base of tongue primary  . Throat cancer ([place]) 10/2014   had chemo  . Thyroid disease     Past Surgical History:  Procedure Laterality Date  . CARDIAC CATHETERIZATION  10/17/2001   EF estimated at 30% / moderate LV enlargement  / 1. Minimal nonobstructive atherosclerotic coronary artery disease / 2. Severe LV dysfunction with global hypokinesia consistent with dilated nonischemic cardiomyopath / 3. Moderate pulmonary hypertension  . COLONOSCOPY  06/23/2017   per Dr. Carlean Purl, clear, no repeats needed   . LYMPH NODE BIOPSY    . RIGHT/LEFT HEART CATH AND CORONARY ANGIOGRAPHY N/A 07/22/2020   Procedure: RIGHT/LEFT HEART CATH AND CORONARY ANGIOGRAPHY;  Surgeon: [name], Moataz Tavis M, MD;  Location: [facility];  Service: Cardiovascular;  Laterality: N/A;    Social History   Tobacco Use  Smoking Status Never Smoker  Smokeless Tobacco Never Used    Social History   Substance and Sexual Activity  Alcohol Use Yes  . Alcohol/week: 0.0 standard drinks   Comment: occasional beer, a couple times a month    Family History  Problem  Relation Age of Onset  . Stroke Father   . Cancer Father        kidney ca  . Angina Mother   . Colon cancer Neg Hx   . Esophageal cancer Neg Hx   . Rectal cancer Neg Hx   . Stomach cancer Neg Hx     Review of Systems:  All other systems were reviewed and are negative.  Physical Exam: BP 118/72   Pulse 79   Ht 5\' 9"  (1.753 m)   Wt 162 lb (73.5 kg)   SpO2 99%   BMI 23.92 kg/m  GENERAL:  Well appearing WM in NAD HEENT:  PERRL, EOMI, sclera are clear. Oropharynx is clear. NECK:  No jugular venous distention,  carotid upstroke brisk and symmetric, no bruits, no thyromegaly or adenopathy LUNGS:  Clear to auscultation bilaterally CHEST:  Unremarkable HEART:  RRR with extrasystoles,  PMI not displaced or sustained,S1 and S2 within normal limits, no S3, no S4: no clicks, no rubs, no murmurs ABD:  Soft, nontender. BS +, no masses or bruits. No hepatomegaly, no splenomegaly EXT:  2 + pulses throughout, no edema, no cyanosis no clubbing SKIN:  Warm and dry.  No rashes NEURO:  Alert and oriented x 3. Cranial nerves II through XII intact. PSYCH:  Cognitively intact   LABORATORY DATA:  Lab Results  Component Value Date   WBC 7.9 02/12/2021   HGB 15.6 02/12/2021   HCT 46.5 02/12/2021   PLT 209 02/12/2021   GLUCOSE 97 02/12/2021   CHOL 106 02/12/2021   TRIG 57 02/12/2021   HDL 41 02/12/2021   LDLCALC 52 02/12/2021   ALT 21 07/15/2020   AST 20 07/15/2020   NA 139 02/12/2021   K 5.2 02/12/2021   CL 101 02/12/2021   CREATININE 1.48 (H) 02/12/2021   BUN 31 (H) 02/12/2021   CO2 23 02/12/2021   TSH 1.89 10/15/2020   PSA 7.93 (H) 10/15/2019   INR 0.95 07/17/2015   HGBA1C 6.3 10/15/2020     Echo 11/02/16: Study Conclusions  - Left ventricle: The cavity size was mildly dilated. Wall   thickness was normal. Systolic function was moderately reduced.   The estimated ejection fraction was in the range of 35% to 40%.   Diffuse hypokinesis. Echogenic linear structure at the apex,    suggestive of calcified apical false tendon. Doppler parameters   are consistent with pseudonormal left ventricular relaxation   (grade 2 diastolic dysfunction). The E/e&' ratio is >15,   suggesting elevated LV filling pressure. - Mitral valve: Mildly thickened leaflets . There was mild   regurgitation. - Left atrium: The atrium was mildly dilated. - Inferior vena cava: The vessel was dilated. The respirophasic   diameter changes were blunted (< 50%), consistent with elevated   central venous pressure.  Impressions:  - Compared to a prior study in 2012, the LVEF is lower at 35-40%.   The LV is mildly dilated and globally hypokinetic. LV filling   pressure appears elevated with grade 2 DD.  Echo 07/14/20: 1. Left ventricular ejection fraction, by estimation, is 20 to 25%. The left ventricle has severely decreased function. The left ventricle demonstrates global hypokinesis. The left ventricular internal cavity size was mildly dilated. Left ventricular diastolic parameters are consistent with Grade III diastolic dysfunction (restrictive). Elevated left ventricular end-diastolic pressure. 2. Right ventricular systolic function is normal. The right ventricular size is normal. There is severely elevated pulmonary artery systolic pressure. 3. Left atrial size was severely dilated. 4. The mitral valve is normal in structure. Mild mitral valve regurgitation. No evidence of mitral stenosis. 5. The aortic valve is tricuspid. There is mild calcification of the aortic valve. There is mild thickening of the aortic valve. Aortic valve regurgitation is not visualized. No aortic stenosis is present. 6. The inferior vena cava is normal in size with <50% respiratory variability, suggesting right atrial pressure of 8 mmHg.  Cardiac cath 07/22/20:  RIGHT/LEFT HEART CATH AND CORONARY ANGIOGRAPHY  Conclusion    Prox LAD to Mid LAD lesion is 25% stenosed.  Prox Cx to Dist Cx lesion is 25%  stenosed.  Prox RCA to Mid RCA lesion is 15% stenosed.  LV end diastolic pressure is moderately elevated.  Hemodynamic findings  consistent with moderate pulmonary hypertension.   1. Nonobstructive CAD 2. Low cardiac output. Index 1.86 3. Moderately elevated LV filling pressures 4. Moderate Pulmonary HTN  Plan: optimize medical therapy for CHF. On Coreg. Just started [place]. Will add lasix 40 mg daily. Further titration of medication depending on initial response.    Event monitor 10/03/19: Study Highlights   Normal sinus rhythm  Occasional runs of NSVT longest lasting 7 beats.  Infrequent runs of SVT longest 9 beats.  No significant bradycardia or pauses  Average HR 71 bpm     Echo 01/05/21: IMPRESSIONS    1. Left ventricular ejection fraction, by estimation, is 20 to 25%. The  left ventricle has severely decreased function. The left ventricle  demonstrates global hypokinesis. The left ventricular internal cavity size  was mildly to moderately dilated. Left  ventricular diastolic parameters are consistent with Grade II diastolic  dysfunction (pseudonormalization). Elevated left atrial pressure.  2. Right ventricular systolic function is normal. The right ventricular  size is normal.  3. Left atrial size was mild to moderately dilated.  4. The mitral valve is normal in structure. Mild mitral valve  regurgitation.  5. The aortic valve is tricuspid. Aortic valve regurgitation is not  visualized. Mild aortic valve sclerosis is present, with no evidence of  aortic valve stenosis.  6. The inferior vena cava is normal in size with greater than 50%  respiratory variability, suggesting right atrial pressure of 3 mmHg.   Comparison(s): The left ventricular function is unchanged.    Assessment / Plan:  1. Dilated cardiomyopathy with EF 35-40% in 2018.   Echo shows significant decline in LV function with EF 20-25%.  Symptoms have improved with change in medication.  Now class 1.  Will continue Coreg at 25 mg bid,  Entresto 49/51 mg bid, aldactone 25 mg daily and lasix.  Jardiance 10 mg daily. Repeat Echo showed no change in EF but clinically he is doing well. Plan ICD implant later this month.  2. Hypertension well controlled.  3. PVCs/brief SVT/NSVT.  Continue carvedilol but at lower dose 25 mg bid.   4. SSCA of the tongue. S/p RT and chemo.    Follow up in 4 months

## 2021-02-13 NOTE — Telephone Encounter (Signed)
I would need to see him to evaluate this first

## 2021-02-16 ENCOUNTER — Ambulatory Visit: Payer: PPO | Admitting: Cardiology

## 2021-02-16 ENCOUNTER — Encounter: Payer: Self-pay | Admitting: Cardiology

## 2021-02-16 ENCOUNTER — Other Ambulatory Visit: Payer: Self-pay

## 2021-02-16 VITALS — BP 118/72 | HR 79 | Ht 69.0 in | Wt 162.0 lb

## 2021-02-16 DIAGNOSIS — I428 Other cardiomyopathies: Secondary | ICD-10-CM

## 2021-02-16 DIAGNOSIS — I493 Ventricular premature depolarization: Secondary | ICD-10-CM | POA: Diagnosis not present

## 2021-02-16 DIAGNOSIS — E7800 Pure hypercholesterolemia, unspecified: Secondary | ICD-10-CM | POA: Diagnosis not present

## 2021-02-16 DIAGNOSIS — I5022 Chronic systolic (congestive) heart failure: Secondary | ICD-10-CM | POA: Diagnosis not present

## 2021-02-16 NOTE — Telephone Encounter (Signed)
Spoke with state that he is at a doctors office and he will call the office back after his dr visit this morning

## 2021-02-17 ENCOUNTER — Encounter: Payer: Self-pay | Admitting: Family Medicine

## 2021-02-17 ENCOUNTER — Ambulatory Visit (INDEPENDENT_AMBULATORY_CARE_PROVIDER_SITE_OTHER): Payer: PPO | Admitting: Family Medicine

## 2021-02-17 VITALS — BP 108/60 | HR 51 | Temp 97.7°F | Wt 165.0 lb

## 2021-02-17 DIAGNOSIS — K649 Unspecified hemorrhoids: Secondary | ICD-10-CM

## 2021-02-17 DIAGNOSIS — M542 Cervicalgia: Secondary | ICD-10-CM

## 2021-02-17 MED ORDER — HYDROCORTISONE (PERIANAL) 2.5 % EX CREA: 1.0000 "application " | TOPICAL_CREAM | Freq: Two times a day (BID) | CUTANEOUS | 5 refills | Status: DC

## 2021-02-17 NOTE — Progress Notes (Signed)
   Subjective:    Patient ID: Nicholas Wilkerson, male    DOB: [birth year], 80 y.o.   MRN: [id number]  HPI Here for 2 issues. First he has had pain and stiffness in the neck for several years, particularly on the right side. He does stretching exercises at home and he applies heat sometimes. He does not take medication for this. He asks for a referral to PT for this. Also his hemorrhoids are flaring up with itching and burning. No bleeding.    Review of Systems  Constitutional: Negative.   Respiratory: Negative.   Cardiovascular: Negative.   Gastrointestinal: Positive for rectal pain.  Musculoskeletal: Positive for neck pain and neck stiffness.       Objective:   Physical Exam Constitutional:      Appearance: Normal appearance.  Cardiovascular:     Rate and Rhythm: Normal rate and regular rhythm.     Pulses: Normal pulses.     Heart sounds: Normal heart sounds.  Pulmonary:     Effort: Pulmonary effort is normal.     Breath sounds: Normal breath sounds.  Musculoskeletal:     Comments: He is tender along the right posterior neck. ROM is limited.   Neurological:     Mental Status: He is alert.           Assessment & Plan:  For the neck pain, we will refer him to PT. For the hemorrhoids, use 2.5% hydrocortisone cream.  Alysia Penna, MD

## 2021-02-18 NOTE — Telephone Encounter (Signed)
Spoke with pt state that he has already took care of the issue and that nothing further is needed

## 2021-02-25 ENCOUNTER — Encounter: Payer: Self-pay | Admitting: Family Medicine

## 2021-02-25 ENCOUNTER — Telehealth (INDEPENDENT_AMBULATORY_CARE_PROVIDER_SITE_OTHER): Payer: PPO | Admitting: Family Medicine

## 2021-02-25 DIAGNOSIS — M5442 Lumbago with sciatica, left side: Secondary | ICD-10-CM

## 2021-02-25 MED ORDER — METHOCARBAMOL 500 MG PO TABS: 500.0000 mg | ORAL_TABLET | Freq: Four times a day (QID) | ORAL | 1 refills | Status: DC | PRN

## 2021-02-25 MED ORDER — METHYLPREDNISOLONE 4 MG PO TBPK: ORAL_TABLET | ORAL | 0 refills | Status: DC

## 2021-02-25 MED ORDER — OXYCODONE HCL 10 MG PO TABS: 10.0000 mg | ORAL_TABLET | ORAL | 0 refills | Status: AC | PRN

## 2021-02-25 NOTE — Progress Notes (Signed)
Subjective:    Patient ID: Nicholas Wilkerson, male    DOB: Jun 16, 1941, 80 y.o.   MRN: [id number]  HPI Virtual Visit via Video Note  I connected with the patient on 02/25/21 at 11:00 AM EDT by a video enabled telemedicine application and verified that I am speaking with the correct person using two identifiers.  Location patient: home Location provider:work or home office Persons participating in the virtual visit: patient, provider  I discussed the limitations of evaluation and management by telemedicine and the availability of in person appointments. The patient expressed understanding and agreed to proceed.   HPI: Here for 5 days of severe pain in the left lower back that radiates down the left leg. No numbness or weakness in the leg. This started after he lifted a heavy jug of water to place in his water cooler at home. He has been applying heat, and he has taken a few Methocarbamol and Oxycodone 5 mg tablets that his wife had at his house. This has helped only slightly. He had an MRI of the lumbar spine in 2005 that showed herniated discs at the L4-L5 and L5-S1 levels.    ROS: See pertinent positives and negatives per HPI.  Past Medical History:  Diagnosis Date  . ARTHRITIS 10/22/2008  . BPH (benign prostatic hyperplasia)   . CHF (congestive heart failure) [facility])    sees Dr. Peter [name]   . Dilated cardiomyopathy ([place])   . DIVERTICULOSIS, COLON 12/08/2007  . Glaucoma    sees Dr. Heather [place]   . H/O asbestos exposure   . History of echocardiogram 05/22/2007   EF was 45-50% / Mild concentric LV hypertrophy with mild global hypokinesis and overall mild systolic dysfunction .  Mild AV sclerosis / Mild Mitral insufficiency / compared to prior study 04/24/02 -- LV function has improved further.    . History of kidney stones   . Hypercholesterolemia   . Hypertension   . Insomnia 06/11/2015  . Radiation 01/01/15-02/18/15   base of tongue and bilateral neck 70 Gy  . Skin cancer     squamous cell, basal cell  . Squamous cell carcinoma 11/20/14   base of tongue primary  . Throat cancer ([place]) 10/2014   had chemo  . Thyroid disease     Past Surgical History:  Procedure Laterality Date  . CARDIAC CATHETERIZATION  10/17/2001   EF estimated at 30% / moderate LV enlargement  / 1. Minimal nonobstructive atherosclerotic coronary artery disease / 2. Severe LV dysfunction with global hypokinesia consistent with dilated nonischemic cardiomyopath / 3. Moderate pulmonary hypertension  . COLONOSCOPY  06/23/2017   per Dr. Carlean Purl, clear, no repeats needed   . LYMPH NODE BIOPSY    . RIGHT/LEFT HEART CATH AND CORONARY ANGIOGRAPHY N/A 07/22/2020   Procedure: RIGHT/LEFT HEART CATH AND CORONARY ANGIOGRAPHY;  Surgeon: [name], [name], MD;  Location: [facility];  Service: Cardiovascular;  Laterality: N/A;    Family History  Problem Relation Age of Onset  . Stroke Father   . Cancer Father        kidney ca  . Angina Mother   . Colon cancer Neg Hx   . Esophageal cancer Neg Hx   . Rectal cancer Neg Hx   . Stomach cancer Neg Hx      Current Outpatient Medications:  .  aspirin EC 81 MG tablet, Take 81 mg by mouth daily., Disp: , Rfl:  .  atorvastatin (LIPITOR) 40 MG tablet, Take 1 tablet (40  mg total) by mouth daily., Disp: 90 tablet, Rfl: 3 .  B Complex-C (B-COMPLEX WITH VITAMIN C) tablet, Take 1 tablet by mouth daily., Disp: , Rfl:  .  brimonidine (ALPHAGAN) 0.2 % ophthalmic solution, Place 1 drop into both eyes at bedtime. , Disp: , Rfl:  .  carvedilol (COREG) 25 MG tablet, Take 1 tablet (25 mg total) by mouth 2 (two) times daily., Disp: 180 tablet, Rfl: 3 .  empagliflozin (JARDIANCE) 10 MG TABS tablet, Take 1 tablet (10 mg total) by mouth daily before breakfast., Disp: 90 tablet, Rfl: 3 .  fluticasone (FLONASE) 50 MCG/ACT nasal spray, Place 2 sprays into both nostrils daily., Disp: 16 g, Rfl: 11 .  furosemide (LASIX) 40 MG tablet, Take 1 tablet (40 mg total) by mouth  daily., Disp: 90 tablet, Rfl: 3 .  hydrocortisone (PROCTOZONE-HC) 2.5 % rectal cream, Place 1 application rectally 2 (two) times daily., Disp: 28 g, Rfl: 5 .  latanoprost (XALATAN) 0.005 % ophthalmic solution, Place 1 drop into both eyes at bedtime. , Disp: , Rfl:  .  levothyroxine (SYNTHROID) 75 MCG tablet, Take 1 tablet by mouth once daily, Disp: 90 tablet, Rfl: 0 .  Melatonin 10 MG TABS, Take 10 mg by mouth at bedtime. , Disp: , Rfl:  .  methocarbamol (ROBAXIN) 500 MG tablet, Take 1 tablet (500 mg total) by mouth every 6 (six) hours as needed for muscle spasms., Disp: 60 tablet, Rfl: 1 .  methylPREDNISolone (MEDROL DOSEPAK) 4 MG TBPK tablet, As directed, Disp: 21 tablet, Rfl: 0 .  Multiple Vitamin (MULTIVITAMIN WITH MINERALS) TABS tablet, Take 1 tablet by mouth daily., Disp: , Rfl:  .  Oxycodone HCl 10 MG TABS, Take 1 tablet (10 mg total) by mouth every 4 (four) hours as needed for up to 5 days (pain)., Disp: 30 tablet, Rfl: 0 .  sacubitril-valsartan (ENTRESTO) 49-51 MG, Take 1 tablet by mouth 2 (two) times daily., Disp: 180 tablet, Rfl: 3 .  sodium fluoride (FLUORISHIELD) 1.1 % GEL dental gel, Instill one drop of gel per tooth space of fluoride tray. Place over teeth for 5 minutes. Remove. Spit out excess. Repeat nightly. (Patient taking differently: Place 1 application onto teeth at bedtime. Instill one drop of gel per tooth space of fluoride tray. Place over teeth for 5 minutes. Remove. Spit out excess. Repeat nightly.), Disp: 120 mL, Rfl: prn .  sodium fluoride (FLUORISHIELD) 1.1 % GEL dental gel, PLACE 1 APPLICATION ONTO THE TEETH AT BEDTIME, Disp: 112 mL, Rfl: 11 .  sodium fluoride (SODIUM FLUORIDE 5000 PPM) 1.1 % GEL dental gel, Place 1 application onto teeth at bedtime., Disp: 120 mL, Rfl: 11 .  spironolactone (ALDACTONE) 25 MG tablet, Take 1 tablet (25 mg total) by mouth daily., Disp: 90 tablet, Rfl: 3 .  tamsulosin (FLOMAX) 0.4 MG CAPS capsule, Take 1 capsule by mouth once daily, Disp: 90  capsule, Rfl: 0  EXAM:  VITALS per patient if applicable:  GENERAL: alert, oriented. He is lying on his bed, in a lot of pain   HEENT: atraumatic, conjunttiva clear, no obvious abnormalities on inspection of external nose and ears  NECK: normal movements of the head and neck  LUNGS: on inspection no signs of respiratory distress, breathing rate appears normal, no obvious gross SOB, gasping or wheezing  CV: no obvious cyanosis  MS: moves all visible extremities without noticeable abnormality  PSYCH/NEURO: pleasant and cooperative, no obvious depression or anxiety, speech and thought processing grossly intact  ASSESSMENT AND PLAN: Low back  pain with sciatica. He will use heat as needed. Add Methocarbamol 500 mg and Oxycodone 10 mg as needed. Also add a Medrol dose pack. He will follow up as needed. Alysia Penna, MD  Discussed the following assessment and plan:  No diagnosis found.     I discussed the assessment and treatment plan with the patient. The patient was provided an opportunity to ask questions and all were answered. The patient agreed with the plan and demonstrated an understanding of the instructions.   The patient was advised to call back or seek an in-person evaluation if the symptoms worsen or if the condition fails to improve as anticipated.     Review of Systems     Objective:   Physical Exam        Assessment & Plan:

## 2021-02-27 ENCOUNTER — Telehealth: Payer: Self-pay | Admitting: Family Medicine

## 2021-02-27 NOTE — Telephone Encounter (Signed)
Discussed w/ pt. Pt reports that he is doing some what better, but still not sure he can proceed w/ implant next week. Pt aware that he does NOT need to be Covid screened prior to procedure anymore, unless circumstance such as symptoms or re-exposure (wife currently has Covid). Pt going to see if things improve enough over the weekend that he feels prepared to proceed vs rescheduling to next month. He will send mychart message Monday w/ decision. Aware if proceeds with next week we may need to Covid screen him being that his wife currently has Covid. Patient verbalized understanding and agreeable to plan.

## 2021-02-27 NOTE — Telephone Encounter (Signed)
Patient wanted to let Dr. Sarajane Jews know that he is doing 100% better and wants Dr. Sarajane Jews to know how much he appreciates him.  Today is the first day that the patient has ben able to get out of bed for almost a week now.  FYI

## 2021-03-02 ENCOUNTER — Other Ambulatory Visit (HOSPITAL_COMMUNITY): Payer: PPO

## 2021-03-02 ENCOUNTER — Encounter: Payer: Self-pay | Admitting: Family Medicine

## 2021-03-02 ENCOUNTER — Other Ambulatory Visit: Payer: PPO

## 2021-03-02 NOTE — Telephone Encounter (Signed)
Pt reports he is not doing as good, states he is tough usually but this has got him down.  Aware will move his ICD implant to next month.  Aware I will be in touch w/ date (6/8 vs 6/23). Pt agreeable to plan

## 2021-03-02 NOTE — Telephone Encounter (Addendum)
Pt reports he is not doing as good and he is Covid + via home test, states he is tough usually but this has got him down.  Aware will move his ICD implant to next month.  Aware I will be in touch w/ date (6/8 vs 6/23). Pt agreeable to plan

## 2021-03-03 ENCOUNTER — Telehealth: Payer: Self-pay | Admitting: Family Medicine

## 2021-03-03 NOTE — Telephone Encounter (Signed)
Patient calling would like a refill for methylPREDNISolone (MEDROL DOSEPAK) 4 MG TBPK tablet   Patient states the medicine is helping.   Please send to: [facility] [address] ([address] [place]  [address] ([place]) [place] [address]  Phone:  [phone] Fax:  [phone]

## 2021-03-03 NOTE — Telephone Encounter (Signed)
Previous my chart message has been sent from 03/03/21

## 2021-03-04 MED ORDER — PREDNISONE 10 MG PO TABS: 10.0000 mg | ORAL_TABLET | Freq: Two times a day (BID) | ORAL | 0 refills | Status: DC | PRN

## 2021-03-04 NOTE — Telephone Encounter (Signed)
Instead of another Medrol dose pack, I sent in a bottle of Prednisone that he can use as needed

## 2021-03-06 ENCOUNTER — Other Ambulatory Visit: Payer: Self-pay

## 2021-03-06 MED ORDER — METHYLPREDNISOLONE 4 MG PO TBPK: ORAL_TABLET | ORAL | 0 refills | Status: DC

## 2021-03-06 NOTE — Telephone Encounter (Signed)
Please call in another dose pack

## 2021-03-06 NOTE — Telephone Encounter (Signed)
Refill has been sent to [facility] on [address].

## 2021-03-06 NOTE — Telephone Encounter (Signed)
FYI Spoke with pt state that he picked up Rx and is taking. Pt state how thankful he is to [name] for getting him to feeling better again.

## 2021-03-11 ENCOUNTER — Encounter: Payer: Self-pay | Admitting: Family Medicine

## 2021-03-12 NOTE — Telephone Encounter (Signed)
He needs an in person OV with me to discuss this. He will likely require an MRI scan before they will see him

## 2021-03-16 ENCOUNTER — Telehealth (INDEPENDENT_AMBULATORY_CARE_PROVIDER_SITE_OTHER): Payer: PPO | Admitting: Family Medicine

## 2021-03-16 ENCOUNTER — Encounter: Payer: Self-pay | Admitting: Family Medicine

## 2021-03-16 ENCOUNTER — Ambulatory Visit: Payer: PPO | Admitting: Physical Therapy

## 2021-03-16 ENCOUNTER — Other Ambulatory Visit (HOSPITAL_COMMUNITY): Payer: Self-pay

## 2021-03-16 ENCOUNTER — Telehealth: Payer: Self-pay | Admitting: *Deleted

## 2021-03-16 DIAGNOSIS — M4807 Spinal stenosis, lumbosacral region: Secondary | ICD-10-CM | POA: Diagnosis not present

## 2021-03-16 DIAGNOSIS — G8929 Other chronic pain: Secondary | ICD-10-CM | POA: Diagnosis not present

## 2021-03-16 DIAGNOSIS — M5442 Lumbago with sciatica, left side: Secondary | ICD-10-CM

## 2021-03-16 DIAGNOSIS — U071 COVID-19: Secondary | ICD-10-CM | POA: Diagnosis not present

## 2021-03-16 MED FILL — Sodium Fluoride Gel 1.1% (0.5% F): DENTAL | 30 days supply | Qty: 112 | Fill #1 | Status: AC

## 2021-03-16 NOTE — Progress Notes (Signed)
   Subjective:    Patient ID: Nicholas Wilkerson, male    DOB: [birth year], 80 y.o.   MRN: [id number]  HPI Virtual Visit via Telephone Note  I connected with the patient on 03/16/21 at  1:45 PM EDT by telephone and verified that I am speaking with the correct person using two identifiers.   I discussed the limitations, risks, security and privacy concerns of performing an evaluation and management service by telephone and the availability of in person appointments. I also discussed with the patient that there may be a patient responsible charge related to this service. The patient expressed understanding and agreed to proceed.  Location patient: home Location provider: work or home office Participants present for the call: patient, provider Patient did not have a visit in the prior 7 days to address this/these issue(s).   History of Present Illness: Here to discuss low back pain and left sided sciatica. We saw him on 02-25-21 for this and he was given a Medrol dose pack, Robaxin, and some Oxycodone. These were very effective and he felt better for a week. Then he caught a Covid-19 infection. His wife apparently picked this up at a class reunion and brought it home. He had weakness and a dry cough for about a week, and now he is getting better. He spent several days in bed during this time. Then about a week ago the left sided sciatica pain returned. He has been taking some 10 mg Prednisone tablets he had at home and the Robaxin, and now today he feels much better. Of note he had an MRI scanin 2005 which showed a herniated disc at L4-5 and it was pushing on nerves at that time. He had an epidural steroid injection and he responded well. Today the pain is mild and his energy level has returned to normal.    Observations/Objective: Patient sounds cheerful and well on the phone. I do not appreciate any SOB. Speech and thought processing are grossly intact. Patient reported vitals:  Assessment and  Plan: He is recovering from a Covid-19 infection. He also had another flare of sciatica. We will set up another lumbar MRI to see how things have changed since 2005. We spent 30 minutes reviewing his records and discussing these issues today.  Alysia Penna, MD   Follow Up Instructions:     [phone] 5-10 [phone] 11-20 [id number] 21-30 I did not refer this patient for an OV in the next 24 hours for this/these issue(s).  I discussed the assessment and treatment plan with the patient. The patient was provided an opportunity to ask questions and all were answered. The patient agreed with the plan and demonstrated an understanding of the instructions.   The patient was advised to call back or seek an in-person evaluation if the symptoms worsen or if the condition fails to improve as anticipated.  I provided 30 minutes of non-face-to-face time during this encounter.   Alysia Penna, MD    Review of Systems     Objective:   Physical Exam        Assessment & Plan:

## 2021-03-16 NOTE — Telephone Encounter (Signed)
Pt aware 6/8 date for ICD implant has come available. Pt so appreciates my call and dilegence on this (per pt request), but he doesn't think he can make this week as he thought he could. He would like to keep 6/23 implant date for now. Aware I will send instructions via mychart. Patient verbalized understanding and agreeable to plan.

## 2021-03-17 ENCOUNTER — Ambulatory Visit: Payer: PPO

## 2021-03-18 ENCOUNTER — Other Ambulatory Visit (HOSPITAL_COMMUNITY): Payer: Self-pay

## 2021-03-19 ENCOUNTER — Telehealth: Payer: Self-pay | Admitting: Cardiology

## 2021-03-19 NOTE — Telephone Encounter (Signed)
Called patient to advise his device will be compatible with MRI. Patient appreciative of call.

## 2021-03-19 NOTE — Telephone Encounter (Signed)
Returned call to patient who states that he was calling to see if he could still get his MRI on his back if he is not able to get it done prior to his ICD implant. Patient states he really needs his MRI on his back but is unsure if he is going to be able to do it before his planned ICD implant. Patient would like to know if he would be able to get the MRI after his ICD implant if needed.  Advised patient that I would forward message to Dr. Curt Bears and his nurse for them to review and advise. Patient verbalized understanding.

## 2021-03-19 NOTE — Telephone Encounter (Signed)
    Pt requesting to speak with Dr. Doug Sou nurse regarding pt upcoming procedure

## 2021-03-22 ENCOUNTER — Other Ambulatory Visit: Payer: Self-pay

## 2021-03-22 ENCOUNTER — Other Ambulatory Visit: Payer: Self-pay | Admitting: Family Medicine

## 2021-03-22 ENCOUNTER — Ambulatory Visit
Admission: RE | Admit: 2021-03-22 | Discharge: 2021-03-22 | Disposition: A | Discharge status: Home / Self Care | Payer: PPO | Source: Ambulatory Visit | Attending: Family Medicine | Admitting: Family Medicine

## 2021-03-22 DIAGNOSIS — M48061 Spinal stenosis, lumbar region without neurogenic claudication: Secondary | ICD-10-CM | POA: Diagnosis not present

## 2021-03-22 DIAGNOSIS — M5442 Lumbago with sciatica, left side: Secondary | ICD-10-CM

## 2021-03-22 DIAGNOSIS — M545 Low back pain, unspecified: Secondary | ICD-10-CM | POA: Diagnosis not present

## 2021-03-24 ENCOUNTER — Other Ambulatory Visit: Payer: Self-pay | Admitting: *Deleted

## 2021-03-24 DIAGNOSIS — Z01812 Encounter for preprocedural laboratory examination: Secondary | ICD-10-CM

## 2021-03-24 DIAGNOSIS — I5022 Chronic systolic (congestive) heart failure: Secondary | ICD-10-CM

## 2021-03-26 ENCOUNTER — Encounter: Payer: Self-pay | Admitting: Family Medicine

## 2021-03-26 NOTE — Telephone Encounter (Signed)
We will see whether he wants the referral

## 2021-03-27 ENCOUNTER — Other Ambulatory Visit: Payer: PPO | Admitting: *Deleted

## 2021-03-27 ENCOUNTER — Other Ambulatory Visit: Payer: Self-pay

## 2021-03-27 ENCOUNTER — Ambulatory Visit: Payer: PPO | Attending: Family Medicine

## 2021-03-27 DIAGNOSIS — M542 Cervicalgia: Secondary | ICD-10-CM | POA: Diagnosis not present

## 2021-03-27 DIAGNOSIS — M545 Low back pain, unspecified: Secondary | ICD-10-CM | POA: Insufficient documentation

## 2021-03-27 DIAGNOSIS — Z01812 Encounter for preprocedural laboratory examination: Secondary | ICD-10-CM | POA: Diagnosis not present

## 2021-03-27 DIAGNOSIS — I5022 Chronic systolic (congestive) heart failure: Secondary | ICD-10-CM | POA: Diagnosis not present

## 2021-03-27 DIAGNOSIS — G8929 Other chronic pain: Secondary | ICD-10-CM | POA: Diagnosis not present

## 2021-03-27 DIAGNOSIS — R293 Abnormal posture: Secondary | ICD-10-CM | POA: Diagnosis not present

## 2021-03-27 NOTE — Therapy (Signed)
[place], [place], [address] Phone: [phone]   Fax:  [phone]  Physical Therapy Evaluation  Patient Details  Name: Nicholas Wilkerson MRN: [id number] Date of Birth: [birth year] Referring Provider (PT): Laurey Morale, MD  Encounter Date: 03/27/2021   PT End of Session - 03/27/21 1522     Visit Number 1    Number of Visits 12    Date for PT Re-Evaluation 05/22/21    Authorization Type [employer]    PT Start Time 1102    PT Stop Time 1155    PT Time Calculation (min) 53 min    Activity Tolerance Patient tolerated treatment well    Behavior During Therapy [facility] for tasks assessed/performed             Past Medical History:  Diagnosis Date   ARTHRITIS 10/22/2008   BPH (benign prostatic hyperplasia)    CHF (congestive heart failure) ([place])    sees Dr. Peter [name]    Dilated cardiomyopathy [facility])    DIVERTICULOSIS, COLON 12/08/2007   Glaucoma    sees Dr. Heather [place]    H/O asbestos exposure    History of echocardiogram 05/22/2007   EF was 45-50% / Mild concentric LV hypertrophy with mild global hypokinesis and overall mild systolic dysfunction .  Mild AV sclerosis / Mild Mitral insufficiency / compared to prior study 04/24/02 -- LV function has improved further.     History of kidney stones    Hypercholesterolemia    Hypertension    Insomnia 06/11/2015   Radiation 01/01/15-02/18/15   base of tongue and bilateral neck 70 Gy   Skin cancer    squamous cell, basal cell   Squamous cell carcinoma 11/20/14   base of tongue primary   Throat cancer ([place]) 10/2014   had chemo   Thyroid disease     Past Surgical History:  Procedure Laterality Date   CARDIAC CATHETERIZATION  10/17/2001   EF estimated at 30% / moderate LV enlargement  / 1. Minimal nonobstructive atherosclerotic coronary artery disease / 2. Severe LV dysfunction with global hypokinesia consistent with dilated nonischemic  cardiomyopath / 3. Moderate pulmonary hypertension   COLONOSCOPY  06/23/2017   per Dr. Carlean Purl, clear, no repeats needed    LYMPH NODE BIOPSY     RIGHT/LEFT HEART CATH AND CORONARY ANGIOGRAPHY N/A 07/22/2020   Procedure: RIGHT/LEFT HEART CATH AND CORONARY ANGIOGRAPHY;  Surgeon: [name], [name], MD;  Location: [place] [facility];  Service: Cardiovascular;  Laterality: N/A;    There were no vitals filed for this visit.    Subjective Assessment - 03/27/21 1103     Subjective Pt reports he had throat cancer 7 years ago and radiation, and he has had neck pain coming on since then on both sides, but worse on right side. 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 He isn't doing much currently because his neck and back hurts so badly, he can't do very much. The past 2-3 months it's gotten worse, to the point he has trouble looking both ways at an intersection when he's driving. He feels pain with ADLs like washing hair shower. His back went out about a month ago, and was doing all the vacuuming and mopping, but he can't right now. He gets an ICD implant on 04/02/21.    Pertinent History ICD implant  04/02/21, throat cancer on R side with radiation 7 years ago    Limitations Reading;House hold activities    Diagnostic tests Not on neck    Patient Stated Goals Hoping to find relief with massage and exercise    Currently in Pain? Yes    Pain Score 0-No pain   7-8/10 at worst   Pain Location Neck    Pain Orientation Right;Left    Pain Descriptors / Indicators Dull;Aching;Sore;Sharp;Shooting;Tightness    Pain Type Chronic pain    Pain Radiating Towards down to R shoulder    Pain Onset More than a month ago    Pain Frequency Intermittent    Aggravating Factors  moving neck, bending    Pain Relieving Factors sitting down, CBD cream    Effect of Pain on Daily Activities unable to perform IADLs at home                 [facility] PT Assessment - 03/27/21 0001       Assessment   Medical Diagnosis M54.2 (ICD-10-CM) - Neck pain on right side    Referring Provider (PT) Laurey Morale, MD    Onset Date/Surgical Date --   years   Hand Dominance Right    Next MD Visit every 6 months    Prior Therapy None      Precautions   Precautions None      Restrictions   Weight Bearing Restrictions No      Balance Screen   Has the patient fallen in the past 6 months No    Has the patient had a decrease in activity level because of a fear of falling?  Yes    Is the patient reluctant to leave their home because of a fear of falling?  No      Home Environment   Living Environment Private residence    Living Arrangements Spouse/significant other    Home Access Level entry    [place] Two level;Able to live on main level with bedroom/bathroom    Alternate Level Stairs-Number of Steps 13    Alternate Level Stairs-Rails Can reach both      Prior Function   Level of Independence Independent    Vocation Retired    Surveyor, minerals, garden, Haematologist      Observation/Other Assessments   Focus on Therapeutic Outcomes (FOTO)  56% ability, 65% expected      Posture/Postural Control   Posture/Postural Control Postural limitations    Postural Limitations Rounded Shoulders;Forward head;Decreased lumbar lordosis    Posture Comments increase upper thoracic kyphosis      ROM / Strength   AROM / PROM / Strength AROM      AROM   AROM Assessment Site Cervical    Cervical Flexion 46    Cervical Extension 20    Cervical - Right Side Bend 22    Cervical - Left Side Bend 26    Cervical - Right Rotation 49    Cervical - Left Rotation 54      Palpation   Spinal mobility 2/6 hypomobility thoracic spine    Palpation comment increased muscle tension to cervical PS, UT (R>L), LS, scalenes, pecs                        Objective measurements completed on examination: See above findings.                PT Education - 03/27/21 1223  Education Details Diagnosis, prognosis, FOTO, HEP, discussion on precautions and exercise changes after ICD implant 04/02/21    Person(s) Educated Patient    Methods Explanation;Demonstration;Tactile cues;Verbal cues;Handout    Comprehension Verbalized understanding;Returned demonstration;Verbal cues required;Tactile cues required              PT Short Term Goals - 03/27/21 1523       PT SHORT TERM GOAL #1   Title Pt will be I and compliant with initial HEP.    Baseline given today at eval    Time 3    Period Weeks    Status New    Target Date 04/17/21      PT SHORT TERM GOAL #2   Title Pt will increase AROM cervical rotation at least 5 B.    Baseline see flowsheet    Time 3    Period Weeks    Status New    Target Date 04/17/21               PT Long Term Goals - 03/27/21 1524       PT LONG TERM GOAL #1   Title Pt will be independent with advanced HEP for long term maintenance.    Time 8    Period Weeks    Status New    Target Date 05/22/21      PT LONG TERM GOAL #2   Title Pt will increase B cervical rotation to at least 60 without pain, as needed to check blind spots when driving.    Baseline pain and difficulty, see flowsheet    Time 8    Period Weeks    Status New    Target Date 05/22/21      PT LONG TERM GOAL #3   Title Pt will have </=2/10 with cervical AROM, as needed for I/ADLs, including house and yardwork.    Baseline unable to perform normal vacuuming and mopping currently    Time 8    Period Weeks    Status New    Target Date 05/22/21      PT LONG TERM GOAL #4   Title Pt will return to all leisurely activity with </= 2/10 pain.    Time 8    Period Weeks    Status New    Target Date 05/22/21      PT LONG TERM GOAL #5   Title Pt will increase FOTO ability to at least 65% ability, in order to demonstrate meaningful change in perceived level of functional ability.    Baseline  56% ability    Time 8    Period Weeks    Status New    Target Date 05/22/21                    Plan - 03/27/21 1227     Clinical Impression Statement Mr. Slane is a 80 yo male who presents to PT with chronic neck pain and recent worsening of low back pain. Pt is extremely active normally, but has been unable to do very much the last month due to increased neck and back pain. He has a surgery scheduled for ICD implant on 04/02/21 with expected precautions that will likely affect some exercises prescribed in HEP and in therapy. He demonstrates impairments in posture, cervical ROM, increased muscle tension in neck and upper shoulders, and pain with movement. Pt was educated on diagnosis, prognosis, HEP, and FOTO, as well as interruption of some HEP due to upcoming surgery.  He verbalized understanding and consent to tx. He denies any numbness or tingling or sensation changes. He will benefit from skilled PT 1-2x/week for 8 weeks to address impairments to return to IADLs and ability to do yardwork leisure activity.    Personal Factors and Comorbidities Age;Comorbidity 3+;Past/Current Experience;Time since onset of injury/illness/exacerbation    Comorbidities Hx of cancer, CHF, glaucoma, HLD, HTN, thyroid disease    Examination-Activity Limitations Bend;Hygiene/Grooming    Examination-Participation Restrictions Driving;Community Activity;Cleaning;Yard Work    Merchant navy officer Evolving/Moderate complexity    Clinical Decision Making Moderate    Rehab Potential Good    PT Frequency --   1-2x/week   PT Duration 8 weeks    PT Treatment/Interventions ADLs/Self Care Home Management;Aquatic Therapy;Cryotherapy;Electrical Stimulation;Iontophoresis 4mg /ml Dexamethasone;Moist Heat;Traction;Functional mobility training;Therapeutic activities;Therapeutic exercise;Neuromuscular re-education;Joint Manipulations;Spinal Manipulations;Manual techniques;Passive range of motion;Dry  needling;Patient/family education    PT Next Visit Plan Assess response to HEP/update PRN, pacemaker precautions!!, manual therapy to address joint/soft tissue restrictions    PT Home Exercise Plan [email or web address]    Consulted and Agree with Plan of Care Patient             Patient will benefit from skilled therapeutic intervention in order to improve the following deficits and impairments:  Postural dysfunction, Pain, Impaired flexibility, Increased fascial restricitons, Improper body mechanics, Impaired perceived functional ability, Decreased range of motion  Visit Diagnosis: Cervicalgia  Chronic bilateral low back pain without sciatica  Abnormal posture     Problem List Patient Active Problem List   Diagnosis Date Noted   COVID-19 virus infection 03/16/2021   Elevated PSA 02/28/2020   Hypothyroidism 12/13/2019   Nonischemic cardiomyopathy ([place]) 09/28/2019   Personal history of malignant neoplasm of tongue 12/15/2015   Insomnia 06/11/2015   Skin infection at gastrostomy tube site ([place]) 06/09/2015   Depression, acute 03/24/2015   Palpitations 01/24/2015   History of skin cancer 12/03/2014   Cancer of base of tongue ([place]) 12/02/2014   Acute on chronic systolic CHF (congestive heart failure) ([place]) 07/12/2013   MUSCLE STRAIN, ABDOMINAL WALL 11/28/2008   ARTHRITIS 10/22/2008   BURSITIS, LEFT SHOULDER 07/24/2008   INTERNAL HEMORRHOIDS 12/08/2007   EXTERNAL HEMORRHOIDS 12/08/2007   DIVERTICULOSIS, COLON 12/08/2007   RECTAL BLEEDING 12/08/2007   History of cardiovascular disorder 12/08/2007   Congestive dilated cardiomyopathy ([place]) 06/13/2007   HX, PERSONAL, ARTHRITIS 06/13/2007   Hypercholesterolemia 05/15/2007   Essential hypertension 05/15/2007    Izell [place], PT, DPT 03/27/2021, 3:30 PM  [facility] [address] [place], [place], [address] Phone: [phone]   Fax:  [phone]  Name: Nicholas Wilkerson MRN:  [id number] Date of Birth: [birth year]

## 2021-03-27 NOTE — Patient Instructions (Signed)
Access Code: [email or web address] URL: [email or web address] Date: 03/27/2021 Prepared by: Kathreen Cornfield  Exercises Supine Cervical Retraction with Towel - 2 x daily - 7 x weekly - 2 sets - 10 reps - 3 seconds hold Seated Cervical Traction - 1 x daily - 7 x weekly - 3 sets - 10 reps Sidelying Open Book Thoracic Lumbar Rotation and Extension - 2 x daily - 7 x weekly - 1-2 sets - 10-15 reps Seated Thoracic Extension with Pectoralis Stretch - 2 x daily - 7 x weekly - 2 sets - 10 reps

## 2021-03-28 LAB — CBC
Hematocrit: 48.1 % (ref 37.5–51.0)
Hemoglobin: 16.1 g/dL (ref 13.0–17.7)
MCH: 31.4 pg (ref 26.6–33.0)
MCHC: 33.5 g/dL (ref 31.5–35.7)
MCV: 94 fL (ref 79–97)
Platelets: 225 10*3/uL (ref 150–450)
RBC: 5.13 x10E6/uL (ref 4.14–5.80)
RDW: 12.8 % (ref 11.6–15.4)
WBC: 10.7 10*3/uL (ref 3.4–10.8)

## 2021-03-28 LAB — BASIC METABOLIC PANEL
BUN/Creatinine Ratio: 20 (ref 10–24)
BUN: 35 mg/dL — ABNORMAL HIGH (ref 8–27)
CO2: 23 mmol/L (ref 20–29)
Calcium: 9.3 mg/dL (ref 8.6–10.2)
Chloride: 100 mmol/L (ref 96–106)
Creatinine, Ser: 1.72 mg/dL — ABNORMAL HIGH (ref 0.76–1.27)
Glucose: 102 mg/dL — ABNORMAL HIGH (ref 65–99)
Potassium: 4.6 mmol/L (ref 3.5–5.2)
Sodium: 138 mmol/L (ref 134–144)
eGFR: 40 mL/min/{1.73_m2} — ABNORMAL LOW (ref >59)

## 2021-03-29 ENCOUNTER — Encounter: Payer: Self-pay | Admitting: Family Medicine

## 2021-03-29 DIAGNOSIS — M48062 Spinal stenosis, lumbar region with neurogenic claudication: Secondary | ICD-10-CM

## 2021-03-30 ENCOUNTER — Encounter: Payer: Self-pay | Admitting: Physical Therapy

## 2021-03-30 ENCOUNTER — Ambulatory Visit: Payer: PPO | Admitting: Physical Therapy

## 2021-03-30 ENCOUNTER — Other Ambulatory Visit: Payer: Self-pay | Admitting: Family Medicine

## 2021-03-30 ENCOUNTER — Other Ambulatory Visit: Payer: Self-pay

## 2021-03-30 DIAGNOSIS — R293 Abnormal posture: Secondary | ICD-10-CM

## 2021-03-30 DIAGNOSIS — M542 Cervicalgia: Secondary | ICD-10-CM | POA: Diagnosis not present

## 2021-03-30 DIAGNOSIS — M545 Low back pain, unspecified: Secondary | ICD-10-CM

## 2021-03-30 MED ORDER — TAMSULOSIN HCL 0.4 MG PO CAPS: 0.4000 mg | ORAL_CAPSULE | Freq: Every day | ORAL | 0 refills | Status: DC

## 2021-03-30 NOTE — Therapy (Signed)
[place], [place], [address] Phone: [phone]   Fax:  [phone]  Physical Therapy Treatment  Patient Details  Name: Nicholas Wilkerson MRN: [id number] Date of Birth: 1940/11/13 Referring Provider (PT): Laurey Morale, MD   Encounter Date: 03/30/2021   PT End of Session - 03/30/21 1027     Visit Number 2    Number of Visits 12    Date for PT Re-Evaluation 05/22/21    Authorization Type [employer]    PT Start Time 1017    PT Stop Time 1100    PT Time Calculation (min) 43 min    Activity Tolerance Patient tolerated treatment well    Behavior During Therapy [facility] for tasks assessed/performed             Past Medical History:  Diagnosis Date   ARTHRITIS 10/22/2008   BPH (benign prostatic hyperplasia)    CHF (congestive heart failure) ([place])    sees Dr. Peter [name]    Dilated cardiomyopathy [facility])    DIVERTICULOSIS, COLON 12/08/2007   Glaucoma    sees Dr. Heather [place]    H/O asbestos exposure    History of echocardiogram 05/22/2007   EF was 45-50% / Mild concentric LV hypertrophy with mild global hypokinesis and overall mild systolic dysfunction .  Mild AV sclerosis / Mild Mitral insufficiency / compared to prior study 04/24/02 -- LV function has improved further.     History of kidney stones    Hypercholesterolemia    Hypertension    Insomnia 06/11/2015   Radiation 01/01/15-02/18/15   base of tongue and bilateral neck 70 Gy   Skin cancer    squamous cell, basal cell   Squamous cell carcinoma 11/20/14   base of tongue primary   Throat cancer ([place]) 10/2014   had chemo   Thyroid disease     Past Surgical History:  Procedure Laterality Date   CARDIAC CATHETERIZATION  10/17/2001   EF estimated at 30% / moderate LV enlargement  / 1. Minimal nonobstructive atherosclerotic coronary artery disease / 2. Severe LV dysfunction with global hypokinesia consistent with dilated nonischemic  cardiomyopath / 3. Moderate pulmonary hypertension   COLONOSCOPY  06/23/2017   per Dr. Carlean Purl, clear, no repeats needed    LYMPH NODE BIOPSY     RIGHT/LEFT HEART CATH AND CORONARY ANGIOGRAPHY N/A 07/22/2020   Procedure: RIGHT/LEFT HEART CATH AND CORONARY ANGIOGRAPHY;  Surgeon: [name], [name], MD;  Location: [place] [facility];  Service: Cardiovascular;  Laterality: N/A;    There were no vitals filed for this visit.   Subjective Assessment - 03/30/21 1023     Subjective No pain right now. I had pain this morning after slouching at the computer but I did the stretch and it helpedd. Then I was able to go pick vegetables out of the garden without any pain.    Currently in Pain? No/denies    Aggravating Factors  computer work    Pain Relieving Factors stretches                [facility] PT Assessment - 03/30/21 0001       AROM   Cervical - Right Side Bend 30    Cervical - Left Side Bend 30    Cervical - Right Rotation 52    Cervical - Left Rotation 60  [place] Adult PT Treatment/Exercise - 03/30/21 0001       Neck Exercises: Seated   Other Seated Exercise Seated thoracic extension with pec stretch-hands behind head x 10 x 2 for 5 sec    Other Seated Exercise Seated cervical distraction with towel 10 x 2 for 5 sec      Neck Exercises: Supine   Neck Retraction 5 reps;10 reps    Neck Retraction Limitations over towel roll per HEP                      PT Short Term Goals - 03/30/21 1236       PT SHORT TERM GOAL #1   Title Pt will be I and compliant with initial HEP.    Baseline min cues, reports compliance    Time 3    Period Weeks    Status On-going    Target Date 04/17/21      PT SHORT TERM GOAL #2   Title Pt will increase AROM cervical rotation at least 5 B.    Baseline improved 3 degrees to the right and 6 degrees to the left    Period Weeks    Status Partially Met    Target Date 04/17/21                PT Long Term Goals - 03/27/21 1524       PT LONG TERM GOAL #1   Title Pt will be independent with advanced HEP for long term maintenance.    Time 8    Period Weeks    Status New    Target Date 05/22/21      PT LONG TERM GOAL #2   Title Pt will increase B cervical rotation to at least 60 without pain, as needed to check blind spots when driving.    Baseline pain and difficulty, see flowsheet    Time 8    Period Weeks    Status New    Target Date 05/22/21      PT LONG TERM GOAL #3   Title Pt will have </=2/10 with cervical AROM, as needed for I/ADLs, including house and yardwork.    Baseline unable to perform normal vacuuming and mopping currently    Time 8    Period Weeks    Status New    Target Date 05/22/21      PT LONG TERM GOAL #4   Title Pt will return to all leisurely activity with </= 2/10 pain.    Time 8    Period Weeks    Status New    Target Date 05/22/21      PT LONG TERM GOAL #5   Title Pt will increase FOTO ability to at least 65% ability, in order to demonstrate meaningful change in perceived level of functional ability.    Baseline 56% ability    Time 8    Period Weeks    Status New    Target Date 05/22/21                   Plan - 03/30/21 1238     Clinical Impression Statement Nicholas Wilkerson reports he notes improvement in pain with HEP. He was having pain this morning after computer work and was able to use his HEP to relieve the pain. Reviewed entire HEP and he requires moderate cues to complete with correct technique. He will have ICD implant on 04/02/21 and will have restrictions. No new HEP added  at this time. Patient will call to schedule once appropriate. He does demonstrate improved cervical rotation and sidebend. STG#2 partially met.    PT Next Visit Plan Assess response to HEP/update PRN, pacemaker precautions!!, manual therapy to address joint/soft tissue restrictions    PT Home Exercise Plan [facility]             Patient will  benefit from skilled therapeutic intervention in order to improve the following deficits and impairments:  Postural dysfunction, Pain, Impaired flexibility, Increased fascial restricitons, Improper body mechanics, Impaired perceived functional ability, Decreased range of motion  Visit Diagnosis: Cervicalgia  Chronic bilateral low back pain without sciatica  Abnormal posture     Problem List Patient Active Problem List   Diagnosis Date Noted   COVID-19 virus infection 03/16/2021   Elevated PSA 02/28/2020   Hypothyroidism 12/13/2019   Nonischemic cardiomyopathy ([place]) 09/28/2019   Personal history of malignant neoplasm of tongue 12/15/2015   Insomnia 06/11/2015   Skin infection at gastrostomy tube site ([place]) 06/09/2015   Depression, acute 03/24/2015   Palpitations 01/24/2015   History of skin cancer 12/03/2014   Cancer of base of tongue ([place]) 12/02/2014   Acute on chronic systolic CHF (congestive heart failure) ([place]) 07/12/2013   MUSCLE STRAIN, ABDOMINAL WALL 11/28/2008   ARTHRITIS 10/22/2008   BURSITIS, LEFT SHOULDER 07/24/2008   INTERNAL HEMORRHOIDS 12/08/2007   EXTERNAL HEMORRHOIDS 12/08/2007   DIVERTICULOSIS, COLON 12/08/2007   RECTAL BLEEDING 12/08/2007   History of cardiovascular disorder 12/08/2007   Congestive dilated cardiomyopathy ([place]) 06/13/2007   HX, PERSONAL, ARTHRITIS 06/13/2007   Hypercholesterolemia 05/15/2007   Essential hypertension 05/15/2007    Dorene Ar, PTA 03/30/2021, 12:46 PM  [facility] [address] [place], [place], [address] Phone: [phone]   Fax:  [phone]  Name: Nicholas Wilkerson MRN: [id number] Date of Birth: 03/26/1941

## 2021-03-30 NOTE — Telephone Encounter (Signed)
I did the referral to Neurosurgery

## 2021-04-01 DIAGNOSIS — M542 Cervicalgia: Secondary | ICD-10-CM | POA: Diagnosis not present

## 2021-04-01 DIAGNOSIS — K5903 Drug induced constipation: Secondary | ICD-10-CM | POA: Diagnosis not present

## 2021-04-01 NOTE — Pre-Procedure Instructions (Signed)
Instructed patient on the following items: Arrival time 1000 Nothing to eat or drink after midnight No meds AM of procedure Responsible person to drive you home and stay with you for 24 hrs Wash with special soap night before and morning of procedure  

## 2021-04-02 ENCOUNTER — Encounter (HOSPITAL_COMMUNITY): Payer: Self-pay | Admitting: Cardiology

## 2021-04-02 ENCOUNTER — Ambulatory Visit (HOSPITAL_COMMUNITY): Payer: PPO

## 2021-04-02 ENCOUNTER — Other Ambulatory Visit: Payer: Self-pay

## 2021-04-02 ENCOUNTER — Ambulatory Visit (HOSPITAL_COMMUNITY)
Admission: RE | Admit: 2021-04-02 | Discharge: 2021-04-02 | Disposition: A | Discharge status: Home / Self Care | Payer: PPO | Source: Ambulatory Visit | Attending: Cardiology | Admitting: Cardiology

## 2021-04-02 ENCOUNTER — Encounter (HOSPITAL_COMMUNITY)
Admission: RE | Disposition: A | Discharge status: Home / Self Care | Payer: Self-pay | Source: Ambulatory Visit | Attending: Cardiology

## 2021-04-02 DIAGNOSIS — Z8249 Family history of ischemic heart disease and other diseases of the circulatory system: Secondary | ICD-10-CM | POA: Diagnosis not present

## 2021-04-02 DIAGNOSIS — Z87442 Personal history of urinary calculi: Secondary | ICD-10-CM | POA: Diagnosis not present

## 2021-04-02 DIAGNOSIS — Z9221 Personal history of antineoplastic chemotherapy: Secondary | ICD-10-CM | POA: Insufficient documentation

## 2021-04-02 DIAGNOSIS — Z85810 Personal history of malignant neoplasm of tongue: Secondary | ICD-10-CM | POA: Insufficient documentation

## 2021-04-02 DIAGNOSIS — E079 Disorder of thyroid, unspecified: Secondary | ICD-10-CM | POA: Diagnosis not present

## 2021-04-02 DIAGNOSIS — I5022 Chronic systolic (congestive) heart failure: Secondary | ICD-10-CM | POA: Diagnosis not present

## 2021-04-02 DIAGNOSIS — I420 Dilated cardiomyopathy: Secondary | ICD-10-CM | POA: Insufficient documentation

## 2021-04-02 DIAGNOSIS — E7800 Pure hypercholesterolemia, unspecified: Secondary | ICD-10-CM | POA: Diagnosis not present

## 2021-04-02 DIAGNOSIS — I272 Pulmonary hypertension, unspecified: Secondary | ICD-10-CM | POA: Insufficient documentation

## 2021-04-02 DIAGNOSIS — Z923 Personal history of irradiation: Secondary | ICD-10-CM | POA: Diagnosis not present

## 2021-04-02 DIAGNOSIS — Z95810 Presence of automatic (implantable) cardiac defibrillator: Secondary | ICD-10-CM | POA: Diagnosis not present

## 2021-04-02 DIAGNOSIS — I2510 Atherosclerotic heart disease of native coronary artery without angina pectoris: Secondary | ICD-10-CM | POA: Insufficient documentation

## 2021-04-02 DIAGNOSIS — I110 Hypertensive heart disease with heart failure: Secondary | ICD-10-CM | POA: Insufficient documentation

## 2021-04-02 DIAGNOSIS — Z95818 Presence of other cardiac implants and grafts: Secondary | ICD-10-CM

## 2021-04-02 DIAGNOSIS — Z77090 Contact with and (suspected) exposure to asbestos: Secondary | ICD-10-CM | POA: Diagnosis not present

## 2021-04-02 DIAGNOSIS — Z85828 Personal history of other malignant neoplasm of skin: Secondary | ICD-10-CM | POA: Diagnosis not present

## 2021-04-02 DIAGNOSIS — I428 Other cardiomyopathies: Secondary | ICD-10-CM | POA: Diagnosis not present

## 2021-04-02 DIAGNOSIS — I255 Ischemic cardiomyopathy: Secondary | ICD-10-CM | POA: Diagnosis not present

## 2021-04-02 HISTORY — PX: ICD IMPLANT: EP1208

## 2021-04-02 SURGERY — ICD IMPLANT

## 2021-04-02 MED ORDER — SODIUM CHLORIDE 0.9 % IV SOLN
INTRAVENOUS | Status: AC
  Filled 2021-04-02: qty 2

## 2021-04-02 MED ORDER — HEPARIN (PORCINE) IN NACL 1000-0.9 UT/500ML-% IV SOLN
INTRAVENOUS | Status: AC
  Filled 2021-04-02: qty 500

## 2021-04-02 MED ORDER — LIDOCAINE HCL (PF) 1 % IJ SOLN
INTRAMUSCULAR | Status: AC
  Filled 2021-04-02: qty 60

## 2021-04-02 MED ORDER — LIDOCAINE HCL (PF) 1 % IJ SOLN
INTRAMUSCULAR | Status: DC | PRN
  Administered 2021-04-02: 60 mL

## 2021-04-02 MED ORDER — CHLORHEXIDINE GLUCONATE 4 % EX LIQD: 4.0000 "application " | Freq: Once | CUTANEOUS | Status: DC

## 2021-04-02 MED ORDER — ONDANSETRON HCL 4 MG/2ML IJ SOLN: 4.0000 mg | Freq: Four times a day (QID) | INTRAMUSCULAR | Status: DC | PRN

## 2021-04-02 MED ORDER — ACETAMINOPHEN 325 MG PO TABS
325.0000 mg | ORAL_TABLET | ORAL | Status: DC | PRN
  Filled 2021-04-02: qty 2

## 2021-04-02 MED ORDER — SODIUM CHLORIDE 0.9 % IV SOLN
80.0000 mg | INTRAVENOUS | Status: AC
Start: 2021-04-02 — End: 2021-04-02
  Administered 2021-04-02: 80 mg

## 2021-04-02 MED ORDER — CEFAZOLIN SODIUM-DEXTROSE 2-4 GM/100ML-% IV SOLN
INTRAVENOUS | Status: AC
  Filled 2021-04-02: qty 100

## 2021-04-02 MED ORDER — SODIUM CHLORIDE 0.9 % IV SOLN: INTRAVENOUS | Status: DC

## 2021-04-02 MED ORDER — FENTANYL CITRATE (PF) 100 MCG/2ML IJ SOLN
INTRAMUSCULAR | Status: AC
  Filled 2021-04-02: qty 2

## 2021-04-02 MED ORDER — FENTANYL CITRATE (PF) 100 MCG/2ML IJ SOLN
INTRAMUSCULAR | Status: DC | PRN
  Administered 2021-04-02: 25 ug via INTRAVENOUS

## 2021-04-02 MED ORDER — CEFAZOLIN SODIUM-DEXTROSE 1-4 GM/50ML-% IV SOLN
1.0000 g | Freq: Four times a day (QID) | INTRAVENOUS | Status: DC
  Administered 2021-04-02: 1 g via INTRAVENOUS
  Filled 2021-04-02: qty 50

## 2021-04-02 MED ORDER — HEPARIN (PORCINE) IN NACL 1000-0.9 UT/500ML-% IV SOLN
INTRAVENOUS | Status: DC | PRN
  Administered 2021-04-02: 500 mL

## 2021-04-02 MED ORDER — MIDAZOLAM HCL 5 MG/5ML IJ SOLN
INTRAMUSCULAR | Status: AC
  Filled 2021-04-02: qty 5

## 2021-04-02 MED ORDER — CEFAZOLIN SODIUM-DEXTROSE 2-4 GM/100ML-% IV SOLN
2.0000 g | INTRAVENOUS | Status: AC
  Administered 2021-04-02: 2 g via INTRAVENOUS

## 2021-04-02 MED ORDER — MIDAZOLAM HCL 5 MG/5ML IJ SOLN
INTRAMUSCULAR | Status: DC | PRN
  Administered 2021-04-02: 1 mg via INTRAVENOUS

## 2021-04-02 SURGICAL SUPPLY — 7 items
CABLE SURGICAL S-101-97-12 (CABLE) ×2 IMPLANT
ICD GALLANT VR CDVRA500Q (ICD Generator) ×1 IMPLANT
LEAD DURATA 7122Q-65CM (Lead) ×1 IMPLANT
PAD PRO RADIOLUCENT 2001M-C (PAD) ×2 IMPLANT
SHEATH 7FR PRELUDE SNAP 13 (SHEATH) ×1 IMPLANT
SHEATH PROBE COVER 6X72 (BAG) ×1 IMPLANT
TRAY PACEMAKER INSERTION (PACKS) ×2 IMPLANT

## 2021-04-02 NOTE — H&P (Signed)
Electrophysiology Office Note   Date:  04/02/2021   ID:  Nicholas Wilkerson, DOB [birth year], MRN [id number]  PCP:  Laurey Morale, MD  Cardiologist:  [name] Primary Electrophysiologist:  [name], MD    Chief Complaint: CHF   History of Present Illness: Nicholas Wilkerson is a 80 y.o. male who is being seen today for the evaluation of CHF at the request of No ref. provider found. Presenting today for electrophysiology evaluation.  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  November 2021 he developed palpitations.  Cardiac monitor showed bursts of SVT and nonsustained VT.  His Coreg dose was increased.  January 2022 he started to complain of fatigue.  Echo showed an ejection fraction of 20 to 25% with significant pulmonary hypertension.  He had a left heart catheterization that showed no obstructive coronary artery disease.  He was also found to have moderate pulmonary hypertension.  Coreg and Jardiance were continued.  Entresto and Aldactone were added.  Today, denies symptoms of palpitations, chest pain, shortness of breath, orthopnea, PND, lower extremity edema, claudication, dizziness, presyncope, syncope, bleeding, or neurologic sequela. The patient is tolerating medications without difficulties. Plan ICD today.     Past Medical History:  Diagnosis Date   ARTHRITIS 10/22/2008   BPH (benign prostatic hyperplasia)    CHF (congestive heart failure) ([place])    sees Dr. Peter [name]    Dilated cardiomyopathy [facility])    DIVERTICULOSIS, COLON 12/08/2007   Glaucoma    sees Dr. Heather [place]    H/O asbestos exposure    History of echocardiogram 05/22/2007   EF was 45-50% / Mild concentric LV hypertrophy with mild global hypokinesis and overall mild systolic dysfunction .  Mild AV sclerosis / Mild Mitral insufficiency / compared to prior study 04/24/02  -- LV function has improved further.     History of kidney stones    Hypercholesterolemia    Hypertension    Insomnia 06/11/2015   Radiation 01/01/15-02/18/15   base of tongue and bilateral neck 70 Gy   Skin cancer    squamous cell, basal cell   Squamous cell carcinoma 11/20/14   base of tongue primary   Throat cancer ([place]) 10/2014   had chemo   Thyroid disease    Past Surgical History:  Procedure Laterality Date   CARDIAC CATHETERIZATION  10/17/2001   EF estimated at 30% / moderate LV enlargement  / 1. Minimal nonobstructive atherosclerotic coronary artery disease / 2. Severe LV dysfunction with global hypokinesia consistent with dilated nonischemic cardiomyopath / 3. Moderate pulmonary hypertension   COLONOSCOPY  06/23/2017   per Dr. Carlean Purl, clear, no repeats needed    LYMPH NODE BIOPSY     RIGHT/LEFT HEART CATH AND CORONARY ANGIOGRAPHY N/A 07/22/2020   Procedure: RIGHT/LEFT HEART CATH AND CORONARY ANGIOGRAPHY;  Surgeon: [name], [name], MD;  Location: [place] [facility];  Service: Cardiovascular;  Laterality: N/A;     No current facility-administered medications for this encounter.    Allergies:   Patient has no known allergies.   Social History:  The patient  reports that he has never smoked. He has never used smokeless tobacco. He reports current alcohol use. He reports that he does not use drugs.   Family History:  The patient's family history includes Angina in his mother; Cancer in his father; Stroke in his father.   ROS:  Please see the  history of present illness.   Otherwise, review of systems is positive for none.   All other systems are reviewed and negative.   PHYSICAL EXAM: VS:  BP (!) 100/49   Pulse (!) 41   Temp 97.8 F (36.6 C) (Oral)   Ht 5\' 9"  (1.753 m)   Wt 70.3 kg   SpO2 100%   BMI 22.89 kg/m  , BMI Body mass index is 22.89 kg/m. GEN: Well nourished, well developed, in no acute distress  HEENT: normal  Neck: no JVD, carotid bruits, or  masses Cardiac: RRR; no murmurs, rubs, or gallops,no edema  Respiratory:  clear to auscultation bilaterally, normal work of breathing GI: soft, nontender, nondistended, + BS MS: no deformity or atrophy  Skin: warm and dry Neuro:  Strength and sensation are intact Psych: euthymic mood, full affect  EKG:  EKG is ordered today. Personal review of the ekg ordered  shows SR, PVCs   Recent Labs: 07/15/2020: ALT 21; BNP 763.5 10/15/2020: TSH 1.89 03/27/2021: BUN 35; Creatinine, Ser 1.72; Hemoglobin 16.1; Platelets 225; Potassium 4.6; Sodium 138    Lipid Panel     Component Value Date/Time   CHOL 106 02/12/2021 0903   TRIG 57 02/12/2021 0903   TRIG 126 08/25/2006 0850   HDL 41 02/12/2021 0903   CHOLHDL 2.6 02/12/2021 0903   CHOLHDL 5 10/15/2019 1058   VLDL 20.4 10/15/2019 1058   LDLCALC 52 02/12/2021 0903     Wt Readings from Last 3 Encounters:  04/02/21 70.3 kg  02/17/21 74.8 kg  02/16/21 73.5 kg      Other studies Reviewed: Additional studies/ records that were reviewed today include: TTE 01/05/21  Review of the above records today demonstrates:   1. Left ventricular ejection fraction, by estimation, is 20 to 25%. The  left ventricle has severely decreased function. The left ventricle  demonstrates global hypokinesis. The left ventricular internal cavity size  was mildly to moderately dilated. Left  ventricular diastolic parameters are consistent with Grade II diastolic  dysfunction (pseudonormalization). Elevated left atrial pressure.   2. Right ventricular systolic function is normal. The right ventricular  size is normal.   3. Left atrial size was mild to moderately dilated.   4. The mitral valve is normal in structure. Mild mitral valve  regurgitation.   5. The aortic valve is tricuspid. Aortic valve regurgitation is not  visualized. Mild aortic valve sclerosis is present, with no evidence of  aortic valve stenosis.   6. The inferior vena cava is normal in size with  greater than 50%  respiratory variability, suggesting right atrial pressure of 3 mmHg.   RHC/LHC 07/22/20 Prox LAD to Mid LAD lesion is 25% stenosed. Prox Cx to Dist Cx lesion is 25% stenosed. Prox RCA to Mid RCA lesion is 15% stenosed. LV end diastolic pressure is moderately elevated. Hemodynamic findings consistent with moderate pulmonary hypertension.   ASSESSMENT AND PLAN:  1.  Chronic systolic heart failure secondary to dilated cardiomyopathy: Nicholas Wilkerson has presented today for surgery, with the diagnosis of CHF.  The various methods of treatment have been discussed with the patient and family. After consideration of risks, benefits and other options for treatment, the patient has consented to  Procedure(s): Pacemaker implant as a surgical intervention .  Risks include but not limited to bleeding, infection, pneumothorax, perforation, tamponade, vascular damage, renal failure, MI, stroke, death, and lead dislodgement . The patient's history has been reviewed, patient examined, no change in status, stable for surgery.  I have reviewed the patient's chart and labs.  Questions were answered to the patient's satisfaction.    Nicholas Zettel Curt Bears, MD 04/02/2021 10:03 AM   ICD Criteria  Current LVEF:33%. Within 12 months prior to implant: Yes   Heart failure history: Yes, Class II  Cardiomyopathy history: Yes, Non-Ischemic Cardiomyopathy.  Atrial Fibrillation/Atrial Flutter: No.  Ventricular tachycardia history: No.  Cardiac arrest history: No.  History of syndromes with risk of sudden death: No.  Previous ICD: No.  Current ICD indication: Primary  PPM indication: No.  Class I or II Bradycardia indication present: No  Beta Blocker therapy for 3 or more months: Yes, prescribed.   Ace Inhibitor/ARB therapy for 3 or more months: Yes, prescribed.    I have seen Nicholas Wilkerson is a 80 y.o. malepre-procedural and has been referred by [name] for consideration of ICD implant for  primary prevention of sudden death.  The patient's chart has been reviewed and they meet criteria for ICD implant.  I have had a thorough discussion with the patient reviewing options.  The patient and their family (if available) have had opportunities to ask questions and have them answered. The patient and I have decided together through the [employer] [employer] to implant ICD at this time.  Risks, benefits, alternatives to ICD implantation were discussed in detail with the patient today. The patient  understands that the risks include but are not limited to bleeding, infection, pneumothorax, perforation, tamponade, vascular damage, renal failure, MI, stroke, death, inappropriate shocks, and lead dislodgement and wishes to proceed.

## 2021-04-02 NOTE — Discharge Instructions (Addendum)
After Your ICD (Implantable Cardiac Defibrillator)   You have a [employer] ICD  ACTIVITY Do not lift your arm above shoulder height for 1 week after your procedure. After 7 days, you may progress as below.  You should remove your sling 24 hours after your procedure, unless otherwise instructed by your provider.     Thursday April 09, 2021  Friday April 10, 2021 Saturday April 11, 2021 Sunday April 12, 2021   Do not lift, push, pull, or carry anything over 10 pounds with the affected arm until 6 weeks (Thursday May 14, 2021 ) after your procedure.   You may drive AFTER your wound check, unless you have been told otherwise by your provider.   Ask your healthcare provider when you can go back to work   INCISION/Dressing  If large square, outer bandage is left in place, this can be removed after 24 hours from your procedure. Do not remove steri-strips or glue as below.   Monitor your defibrillator site for redness, swelling, and drainage. Call the device clinic at [phone] if you experience these symptoms or fever/chills.  If your incision is sealed with Steri-strips or staples, you may shower 10 days after your procedure or when told by your provider. Do not remove the steri-strips or let the shower hit directly on your site. You may wash around your site with soap and water.    Avoid lotions, ointments, or perfumes over your incision until it is well-healed.  You may use a hot tub or a pool AFTER your wound check appointment if the incision is completely closed.  Your ICD is designed to protect you from life threatening heart rhythms. Because of this, you may receive a shock.   1 shock with no symptoms:  Call the office during business hours. 1 shock with symptoms (chest pain, chest pressure, dizziness, lightheadedness, shortness of breath, overall feeling unwell):  Call 911. If you experience 2 or more shocks in 24 hours:  Call 911. If you receive a shock, you should not drive for 6  months per the [place] DMV IF you receive appropriate therapy from your ICD.   ICD Alerts:  Some alerts are vibratory and others beep. These are NOT emergencies. Please call our office to let us know. If this occurs at night or on weekends, it can wait until the next business day. Send a remote transmission.  If your device is capable of reading fluid status (for heart failure), you will be offered monthly monitoring to review this with you.   DEVICE MANAGEMENT Remote monitoring is used to monitor your ICD from home. This monitoring is scheduled every 91 days by our office. It allows [place] to keep an eye on the functioning of your device to ensure it is working properly. You will routinely see your Electrophysiologist annually (more often if necessary).   You should receive your ID card for your new device in 4-8 weeks. Keep this card with you at all times once received. Consider wearing a medical alert bracelet or necklace.  Your ICD  may be MRI compatible. This will be discussed at your next office visit/wound check.  You should avoid contact with strong electric or magnetic fields.   Do not use amateur (ham) radio equipment or electric (arc) welding torches. MP3 player headphones with magnets should not be used. Some devices are safe to use if held at least 12 inches (30 cm) from your defibrillator. These include power tools, lawn mowers, and speakers. If you  are unsure if something is safe to use, ask your health care provider.  When using your cell phone, hold it to the ear that is on the opposite side from the defibrillator. Do not leave your cell phone in a pocket over the defibrillator.  You may safely use electric blankets, heating pads, computers, and microwave ovens.  Call the office right away if: You have chest pain. You feel more than one shock. You feel more short of breath than you have felt before. You feel more light-headed than you have felt before. Your incision starts to open  up.  This information is not intended to replace advice given to you by your health care provider. Make sure you discuss any questions you have with your health care provider.

## 2021-04-02 NOTE — Progress Notes (Addendum)
Ambulated to the bathroom tol well. Dr Curt Bears in to see pt. Informed of bp readings. Discharge instructions reviewed with pt and his wife. Both voice understanding.

## 2021-04-03 ENCOUNTER — Telehealth: Payer: Self-pay

## 2021-04-03 NOTE — Telephone Encounter (Signed)
Follow-up after same day discharge: Implant date: 04/02/21 MD: Allegra Lai, MD Device: [facility] ICD Location: Left Chest   Wound check visit: 04/16/21 @10 :20 90 day MD follow-up: 07/16/21 @ 2:30  Remote Transmission received: Yes  Sling Removed: Yes Dressing removed: Yes, No complaints.    Discharge instructions reviewed with patient with verbal understanding. Device Clinic phone number provided.

## 2021-04-03 NOTE — Telephone Encounter (Signed)
-----   Message from [place], [place] sent at 04/02/2021  2:47 PM EDT ----- Same day d/c  SJM ICD  WC

## 2021-04-07 ENCOUNTER — Ambulatory Visit: Payer: PPO | Admitting: Nurse Practitioner

## 2021-04-07 ENCOUNTER — Encounter: Payer: Self-pay | Admitting: Nurse Practitioner

## 2021-04-07 VITALS — BP 100/62 | HR 60 | Ht 69.0 in | Wt 157.1 lb

## 2021-04-07 DIAGNOSIS — K625 Hemorrhage of anus and rectum: Secondary | ICD-10-CM | POA: Diagnosis not present

## 2021-04-07 DIAGNOSIS — K5900 Constipation, unspecified: Secondary | ICD-10-CM

## 2021-04-07 DIAGNOSIS — K648 Other hemorrhoids: Secondary | ICD-10-CM | POA: Diagnosis not present

## 2021-04-07 NOTE — Progress Notes (Signed)
04/07/2021 Nicholas Wilkerson [id number] 06/19/1941   Chief Complaint: Constipation   History of Present Illness: Nicholas Wilkerson. Nicholas Wilkerson is a 80 year old male with a past medical history of arthritis, hypertension, dilated cardiomyopathy/CHF with LV EF 20 - 25%  s/p ICD placement 04/02/2021, squamous cell tongue cancer and throat cancer 2016.   He presents to our office today self-referred for further evaluation regarding constipation.  He developed back pain and took Oxycodone on 10 mg twice daily 02/27/2021 for about 1 week which resulted in constipation and straining.  He described passing normal brown formed stool but noticed a small amount of bright red blood on the toilet tissue when wiping which occurred on several occasions over the past 4 weeks.  No anorectal pain.  No abdominal pain.  He reports having a history of hemorrhoids without any noticeable hemorrhoidal irritation or bleeding for the past 15 years.  His constipation also resulted in difficulty emptying his bladder.  He was getting up every hour during the night to urinate which was quite bothersome.  He started taking MiraLAX 4 days ago which has significantly improved his constipation and he is now urinating without difficulty.  No rectal bleeding for the past 2 days.  He typically gets up once or twice during the night to urinate, which is his normal urinary pattern.  He continues to take prednisone 10 mg daily for 30 days and methocarbamol 500 mg once daily for his back pain.  He underwent a colonoscopy by Dr. Carlean Purl 06/23/2017 which was normal, no further colonoscopies were recommended due to his age.  He has a history of BPH, he last saw his urologist Dr. Alinda Money less than 1 year ago.  He had fatigue without respiratory symptoms and reported a home COVID-19 test 1 month ago was positive.  CBC Latest Ref Rng & Units 03/27/2021 02/12/2021 07/22/2020  WBC 3.4 - 10.8 x10E3/uL 10.7 7.9 -  Hemoglobin 13.0 - 17.7 g/dL 16.1 15.6 18.0(H)   Hematocrit 37.5 - 51.0 % 48.1 46.5 53.0(H)  Platelets 150 - 450 x10E3/uL 225 209 -     ECHO 01/05/2021: 1. Left ventricular ejection fraction, by estimation, is 20 to 25%. The left ventricle has severely decreased function. The left ventricle demonstrates global hypokinesis. The left ventricular internal cavity size was mildly to moderately dilated. Left ventricular diastolic parameters are consistent with Grade II diastolic dysfunction (pseudonormalization). Elevated left atrial pressure. 2. Right ventricular systolic function is normal. The right ventricular size is normal. 3. Left atrial size was mild to moderately dilated. 4. The mitral valve is normal in structure. Mild mitral valve regurgitation. 5. The aortic valve is tricuspid. Aortic valve regurgitation is not visualized. Mild aortic valve sclerosis is present, with no evidence of aortic valve stenosis. 6. The inferior vena cava is normal in size with greater than 50% respiratory variability, suggesting right atrial pressure of 3 mmHg. Comparison(s): The left ventricular function is unchanged.  Colonoscopy 06/23/2017 by Dr. Carlean Purl: - The entire examined colon is normal. - No specimens collected.    Past Medical History:  Diagnosis Date   ARTHRITIS 10/22/2008   BPH (benign prostatic hyperplasia)    CHF (congestive heart failure) ([place])    sees Dr. Peter [name]    Dilated cardiomyopathy [facility])    DIVERTICULOSIS, COLON 12/08/2007   Glaucoma    sees Dr. Heather [place]    H/O asbestos exposure    History of echocardiogram 05/22/2007   EF was 45-50% / Mild concentric LV hypertrophy  with mild global hypokinesis and overall mild systolic dysfunction .  Mild AV sclerosis / Mild Mitral insufficiency / compared to prior study 04/24/02 -- LV function has improved further.     History of kidney stones    Hypercholesterolemia    Hypertension    Insomnia 06/11/2015   Radiation 01/01/15-02/18/15   base of tongue and bilateral neck 70 Gy    Skin cancer    squamous cell, basal cell   Squamous cell carcinoma 11/20/14   base of tongue primary   Throat cancer ([place]) 10/2014   had chemo   Thyroid disease    Past Surgical History:  Procedure Laterality Date   CARDIAC CATHETERIZATION  10/17/2001   EF estimated at 30% / moderate LV enlargement  / 1. Minimal nonobstructive atherosclerotic coronary artery disease / 2. Severe LV dysfunction with global hypokinesia consistent with dilated nonischemic cardiomyopath / 3. Moderate pulmonary hypertension   COLONOSCOPY  06/23/2017   per Dr. Carlean Purl, clear, no repeats needed    ICD IMPLANT N/A 04/02/2021   Procedure: ICD IMPLANT;  Surgeon: [name], MD;  Location: [place] [facility];  Service: Cardiovascular;  Laterality: N/A;   LYMPH NODE BIOPSY     RIGHT/LEFT HEART CATH AND CORONARY ANGIOGRAPHY N/A 07/22/2020   Procedure: RIGHT/LEFT HEART CATH AND CORONARY ANGIOGRAPHY;  Surgeon: [name], [name], MD;  Location: [place] [facility];  Service: Cardiovascular;  Laterality: N/A;    Social History: He has 4 daughters.  He is retired but remains quite active.  Non-smoker.  No alcohol use.  No drug use.  Family History: History of stroke and kidney cancer.  Mother with heart disease.  No known family history of esophageal, gastric or colon cancer.  Current Outpatient Medications on File Prior to Visit  Medication Sig Dispense Refill   aspirin EC 81 MG tablet Take 81 mg by mouth daily.     atorvastatin (LIPITOR) 40 MG tablet Take 1 tablet (40 mg total) by mouth daily. 90 tablet 3   B Complex-C (B-COMPLEX WITH VITAMIN C) tablet Take 1 tablet by mouth once a week.     carvedilol (COREG) 25 MG tablet Take 1 tablet (25 mg total) by mouth 2 (two) times daily. 180 tablet 3   dorzolamide-timolol (COSOPT) 22.3-6.8 MG/ML ophthalmic solution Place 1 drop into both eyes 2 (two) times daily.     empagliflozin (JARDIANCE) 10 MG TABS tablet Take 1 tablet (10 mg total) by mouth daily before breakfast.  90 tablet 3   fluticasone (FLONASE) 50 MCG/ACT nasal spray Place 2 sprays into both nostrils daily. 16 g 11   furosemide (LASIX) 40 MG tablet Take 1 tablet (40 mg total) by mouth daily. 90 tablet 3   hydrocortisone (PROCTOZONE-HC) 2.5 % rectal cream Place 1 application rectally 2 (two) times daily. 28 g 5   latanoprost (XALATAN) 0.005 % ophthalmic solution Place 1 drop into both eyes at bedtime.      levothyroxine (SYNTHROID) 75 MCG tablet Take 1 tablet by mouth once daily 90 tablet 0   Melatonin 10 MG TABS Take 10 mg by mouth at bedtime.      methocarbamol (ROBAXIN) 500 MG tablet Take 1 tablet (500 mg total) by mouth every 6 (six) hours as needed for muscle spasms. 60 tablet 1   methylPREDNISolone (MEDROL DOSEPAK) 4 MG TBPK tablet As directed 21 tablet 0   Oxycodone HCl 10 MG TABS Take 10 mg by mouth every 4 (four) hours as needed (pain).  predniSONE (DELTASONE) 10 MG tablet Take 1 tablet (10 mg total) by mouth 2 (two) times daily as needed (back pain). 60 tablet 0   sacubitril-valsartan (ENTRESTO) 49-51 MG Take 1 tablet by mouth 2 (two) times daily. 180 tablet 3   sodium fluoride (FLUORISHIELD) 1.1 % GEL dental gel Instill one drop of gel per tooth space of fluoride tray. Place over teeth for 5 minutes. Remove. Spit out excess. Repeat nightly. 120 mL prn   sodium fluoride (SODIUM FLUORIDE 5000 PPM) 1.1 % GEL dental gel Place 1 application onto teeth at bedtime. 120 mL 11   spironolactone (ALDACTONE) 25 MG tablet Take 1 tablet (25 mg total) by mouth daily. 90 tablet 3   tamsulosin (FLOMAX) 0.4 MG CAPS capsule Take 1 capsule (0.4 mg total) by mouth daily. 90 capsule 0   No current facility-administered medications on file prior to visit.   No Known Allergies  Review of systems: Gen: Denies fever, sweats or chills. No weight loss.  CV: Denies chest pain, palpitations or edema. Resp: Denies cough, shortness of breath of hemoptysis.  GI: See HPI.  Denies heartburn, dysphagia, stomach or  lower abdominal pain. GU: See HPI.  MS: + Back pain.  Derm: Denies rash, itchiness, skin lesions or unhealing ulcers. Psych: Denies depression, anxiety or memory loss. Heme: Denies bruising, bleeding. Neuro:  Denies headaches, dizziness or paresthesias. Endo:  Denies any problems with DM, thyroid or adrenal function.    Physical Exam: BP 100/62   Pulse 60   Ht 5\' 9"  (1.753 m)   Wt 157 lb 1.6 oz (71.3 kg)   SpO2 96%   BMI 23.20 kg/m  General: 80 year old male in no acute distress. Head: Normocephalic and atraumatic. Eyes: No scleral icterus. Conjunctiva pink . Ears: Normal auditory acuity. Mouth: Dentition intact. No ulcers or lesions.  Lungs: Clear throughout to auscultation. Chest: Left AICD/pacemaker drsg intact with surrounding purple ecchymosis to chest wall.  Heart: Regular rate and rhythm, no murmur. Abdomen: Soft, nontender and nondistended. No masses or hepatomegaly. Normal bowel sounds x 4 quadrants.  Rectal: No external hemorrhoids. Internal hemorrhoids palpated without prolapse or bleeding at time of exam. Enlarged prostate. Melissa CMA present during exam.  Musculoskeletal: Symmetrical with no gross deformities. Extremities: No edema. Neurological: Alert oriented x 4. No focal deficits.  Psychological: Alert and cooperative. Normal mood and affect  Assessment and Recommendations:  37. 80 year old male with constipation with rectal bleeding which developed after taking narcotics for lower back pain.  He started taking MiraLAX for days ago without any further straining or constipation.  No rectal bleeding for the past 2 days.  Hg 16.1 on 03/27/2021. Normal colonoscopy in 2018. -Continue MiraLAX nightly -Apply a small amount of Desitin inside the anal opening and to the external anal area tid as needed for anal or hemorrhoidal irritation/bleeding.  -Consider flexible sigmoidoscopy if rectal bleeding recurs, patient agreed to call our office if rectal bleeding recurs  2.   Lower back pain on Prednisone.  Patient is also on ASA 81 mg daily. -I advised for Mr. Mondo to take Pepcid 20 mg OTC once daily as long as he is on Prednisone.  3.  Colon cancer screening.  Normal in 2018. -No further screening colonoscopies recommended, however, if he continues to have rectal bleeding to consider a flexible sigmoidoscopy as noted above.  3.  History of BPH with increased urinary frequency when constipated.  Urinary symptoms improved after he took MiraLAX which resolved his constipation. -Continue follow-up with  urology  4.  Cardiomyopathy s/p AIC placement 04/02/2021

## 2021-04-07 NOTE — Patient Instructions (Addendum)
If you are age 78 or older, your body mass index should be between 23-30. Your Body mass index is 23.2 kg/m. If this is out of the aforementioned range listed, please consider follow up with your Primary Care Provider.  RECOMMENDATIONS: Miralax- Dissolve one capful in 8 ounces of water and drink before bed. Desitin: Apply a small amount to the external/external anal area three times a day as needed. Take Pepcid 20 MG once a day while you are on Prednisone to reduce risk of stomach ulcers.  Follow up with Urology if urinary symptoms recur.  Please call our office if you start having the rectal bleeding again. Please send [place] a message on MyChart in 6 weeks with an update on how you are doing.  It was great seeing you today! Thank you for entrusting me with your care and choosing [facility].  Noralyn Pick, CRNP

## 2021-04-08 ENCOUNTER — Telehealth: Payer: Self-pay | Admitting: Cardiology

## 2021-04-08 DIAGNOSIS — K5900 Constipation, unspecified: Secondary | ICD-10-CM | POA: Insufficient documentation

## 2021-04-08 NOTE — Telephone Encounter (Signed)
Patient states he had a pacemaker put in on 6/23 and was told he could not shower for 10 days or until he came back for his wound check. He states his appointment is 14 days away and that is unacceptable. He states it is miserable and has had to have his hair washed twice. He states he knows it is because of the holiday. Please advise.

## 2021-04-08 NOTE — Telephone Encounter (Signed)
Returning patients phone call. Advised patient he may take a bath or wash with a wash cloth, as long as he keeps the surgical area including all tape dry. Advised not to allow any water to get near the paper tap/incision site. Advised also to be sure not to life, push or pull more than 10 lbs with the arm that device is located near. States his wife will be able to assist him. Advised patient to call back with any further questions or concerns.

## 2021-04-14 ENCOUNTER — Encounter: Payer: Self-pay | Admitting: Family Medicine

## 2021-04-15 ENCOUNTER — Other Ambulatory Visit: Payer: Self-pay

## 2021-04-15 ENCOUNTER — Encounter: Payer: Self-pay | Admitting: Family Medicine

## 2021-04-15 DIAGNOSIS — R350 Frequency of micturition: Secondary | ICD-10-CM | POA: Diagnosis not present

## 2021-04-15 MED ORDER — DICLOFENAC SODIUM 75 MG PO TBEC: 75.0000 mg | DELAYED_RELEASE_TABLET | Freq: Two times a day (BID) | ORAL | 3 refills | Status: DC

## 2021-04-15 NOTE — Telephone Encounter (Signed)
Call this in for #[phone] with 3 rf

## 2021-04-16 ENCOUNTER — Other Ambulatory Visit: Payer: Self-pay

## 2021-04-16 ENCOUNTER — Ambulatory Visit: Payer: PPO | Admitting: Student

## 2021-04-16 DIAGNOSIS — I5022 Chronic systolic (congestive) heart failure: Secondary | ICD-10-CM | POA: Diagnosis not present

## 2021-04-16 LAB — CUP PACEART INCLINIC DEVICE CHECK
Battery Remaining Longevity: 126 mo
Brady Statistic RV Percent Paced: 0.03 %
Date Time Interrogation Session: 20220707104231
HighPow Impedance: 70.875
Implantable Lead Implant Date: 20220623
Implantable Lead Location: 753860
Implantable Lead Model: 7122
Implantable Pulse Generator Implant Date: 20220623
Lead Channel Impedance Value: 425 Ohm
Lead Channel Pacing Threshold Amplitude: 0.5 V
Lead Channel Pacing Threshold Amplitude: 0.5 V
Lead Channel Pacing Threshold Pulse Width: 0.5 ms
Lead Channel Pacing Threshold Pulse Width: 0.5 ms
Lead Channel Sensing Intrinsic Amplitude: 11.8 mV
Lead Channel Setting Pacing Amplitude: 3.5 V
Lead Channel Setting Pacing Pulse Width: 0.5 ms
Lead Channel Setting Sensing Sensitivity: 0.5 mV
Pulse Gen Serial Number: 810027079

## 2021-04-16 NOTE — Telephone Encounter (Signed)
Make an OV for him to see me

## 2021-04-16 NOTE — Progress Notes (Signed)
Wound check appointment. Steri-strips removed. Wound without redness or edema. Incision edges approximated, wound well healed. Normal device function. Thresholds, sensing, and impedances consistent with implant measurements. Device programmed at 3.5V for extra safety margin until 3 month visit. 3 episodes NSVT noted. Histogram distribution appropriate for patient and level of activity. Patient educated about wound care, arm mobility, lifting restrictions, shock plan. ROV in 3 months with Dr. Curt Bears

## 2021-04-22 DIAGNOSIS — M5416 Radiculopathy, lumbar region: Secondary | ICD-10-CM | POA: Diagnosis not present

## 2021-04-24 DIAGNOSIS — R351 Nocturia: Secondary | ICD-10-CM | POA: Diagnosis not present

## 2021-04-24 DIAGNOSIS — N401 Enlarged prostate with lower urinary tract symptoms: Secondary | ICD-10-CM | POA: Diagnosis not present

## 2021-04-27 ENCOUNTER — Other Ambulatory Visit: Payer: Self-pay

## 2021-04-27 ENCOUNTER — Other Ambulatory Visit (HOSPITAL_COMMUNITY): Payer: Self-pay

## 2021-04-27 ENCOUNTER — Ambulatory Visit (INDEPENDENT_AMBULATORY_CARE_PROVIDER_SITE_OTHER): Payer: PPO | Admitting: Family Medicine

## 2021-04-27 ENCOUNTER — Encounter: Payer: Self-pay | Admitting: Family Medicine

## 2021-04-27 VITALS — BP 120/80 | HR 64 | Temp 98.0°F | Ht 69.0 in | Wt 154.0 lb

## 2021-04-27 DIAGNOSIS — R7309 Other abnormal glucose: Secondary | ICD-10-CM | POA: Diagnosis not present

## 2021-04-27 LAB — POCT GLYCOSYLATED HEMOGLOBIN (HGB A1C): Hemoglobin A1C: 5.9 % — AB (ref 4.0–5.6)

## 2021-04-27 MED FILL — Sodium Fluoride Gel 1.1% (0.5% F): DENTAL | 30 days supply | Qty: 112 | Fill #2 | Status: AC

## 2021-04-27 NOTE — Progress Notes (Signed)
   Subjective:    Patient ID: Nicholas Wilkerson, male    DOB: 19-Oct-1940, 80 y.o.   MRN: [id number]  HPI Here with concerns about his blood sugar. His last A1c here in January was 6.3. Then on 04-24-21 while at the Urology office, his UA showed large amounts of glucose. We told him to come [place] today about this. Of note 2 days before this UA (on 04-22-21) he had an epidural steroid injection to his spine. Over the weekend he felt fine. Today his A1c is 5.9.    Review of Systems  Constitutional: Negative.       Objective:   Physical Exam Constitutional:      Appearance: Normal appearance.  Cardiovascular:     Rate and Rhythm: Normal rate and regular rhythm.     Pulses: Normal pulses.     Heart sounds: Normal heart sounds.  Pulmonary:     Effort: Pulmonary effort is normal.     Breath sounds: Normal breath sounds.  Neurological:     Mental Status: He is alert.          Assessment & Plan:  Transient hyperglycemia. He was reassured this was the result of his steroid injection. Follow up as scheduled.  Alysia Penna, MD

## 2021-04-28 ENCOUNTER — Other Ambulatory Visit (HOSPITAL_COMMUNITY): Payer: Self-pay

## 2021-05-04 ENCOUNTER — Telehealth: Payer: Self-pay | Admitting: Pharmacist

## 2021-05-04 NOTE — Telephone Encounter (Signed)
Patient called as he had a few questions about his medications. Since he had his back issue and was on stronger pain medications, he has been having constipation and wanted to know if any of his current medications were contributing. Discussed that it would be unlikely given he has been on them for a while.  Patient hasn't been as active lately but has been drinking at least 60 ounces of water every day.    Patient is also concerned about the cost of Jardiance. His copay went up to $300 for a 3 months supply and that is not doable for him. Will provide a 30 days supply and will plan to provide samples alternating months until patient gets out of the coverage gap. Scheduled office visit follow up for next week to get samples.

## 2021-05-07 DIAGNOSIS — H401131 Primary open-angle glaucoma, bilateral, mild stage: Secondary | ICD-10-CM | POA: Diagnosis not present

## 2021-05-12 ENCOUNTER — Telehealth: Payer: Self-pay | Admitting: Pharmacist

## 2021-05-12 NOTE — Chronic Care Management (AMB) (Signed)
    Chronic Care Management Pharmacy Assistant   Name: Nicholas Wilkerson  MRN: [id number] DOB: 1941-06-20  05-12-2021 Patient called to remind of appointment with [name] Clinical Pharmacist on 05-13-2021 at 10:30 in office  No answer, left message of appointment date, time and type of appointment in office. Left message to have all medications, supplements, blood pressure and/or blood sugar logs available during appointment and to return call if need to reschedule.   Care Gaps:  Hepatitis Screening - Overdue Zoster vaccine overdue TDAP - Overdue COVID Booster #2 - Overdue Flu Vaccine - Overdue  Star Rating Drug:  Atorvastatin '40mg'$  - Last filled 03-12-2021 90DS at [facility] '10mg'$  - Last filled 05-04-2021 90DS at [facility]  Any gaps in medications fill history? None   Medications: Outpatient Encounter Medications as of 05/12/2021  Medication Sig   aspirin EC 81 MG tablet Take 81 mg by mouth daily.   atorvastatin (LIPITOR) 40 MG tablet Take 1 tablet (40 mg total) by mouth daily.   B Complex-C (B-COMPLEX WITH VITAMIN C) tablet Take 1 tablet by mouth once a week. (Patient not taking: Reported on 04/27/2021)   carvedilol (COREG) 25 MG tablet Take 1 tablet (25 mg total) by mouth 2 (two) times daily.   diclofenac (VOLTAREN) 75 MG EC tablet Take 1 tablet (75 mg total) by mouth 2 (two) times daily. (Patient not taking: Reported on 04/27/2021)   dorzolamide-timolol (COSOPT) 22.3-6.8 MG/ML ophthalmic solution Place 1 drop into both eyes 2 (two) times daily.   empagliflozin (JARDIANCE) 10 MG TABS tablet Take 1 tablet (10 mg total) by mouth daily before breakfast.   fluticasone (FLONASE) 50 MCG/ACT nasal spray Place 2 sprays into both nostrils daily.   furosemide (LASIX) 40 MG tablet Take 1 tablet (40 mg total) by mouth daily.   hydrocortisone (PROCTOZONE-HC) 2.5 % rectal cream Place 1 application rectally 2 (two) times daily. (Patient not taking: Reported on 04/27/2021)   latanoprost  (XALATAN) 0.005 % ophthalmic solution Place 1 drop into both eyes at bedtime.    levothyroxine (SYNTHROID) 75 MCG tablet Take 1 tablet by mouth once daily   Melatonin 10 MG TABS Take 10 mg by mouth at bedtime.    sacubitril-valsartan (ENTRESTO) 49-51 MG Take 1 tablet by mouth 2 (two) times daily.   sodium fluoride (FLUORISHIELD) 1.1 % GEL dental gel Instill one drop of gel per tooth space of fluoride tray. Place over teeth for 5 minutes. Remove. Spit out excess. Repeat nightly.   sodium fluoride (FLUORISHIELD) 1.1 % GEL dental gel PLACE 1 APPLICATION ONTO THE TEETH AT BEDTIME (Patient not taking: Reported on 03/27/2021)   sodium fluoride (SODIUM FLUORIDE 5000 PPM) 1.1 % GEL dental gel Place 1 application onto teeth at bedtime.   spironolactone (ALDACTONE) 25 MG tablet Take 1 tablet (25 mg total) by mouth daily.   tamsulosin (FLOMAX) 0.4 MG CAPS capsule Take 1 capsule (0.4 mg total) by mouth daily.   No facility-administered encounter medications on file as of 05/12/2021.     [place] Clinical Pharmacist Assistant [phone]

## 2021-05-13 ENCOUNTER — Ambulatory Visit: Payer: PPO

## 2021-05-13 ENCOUNTER — Other Ambulatory Visit: Payer: Self-pay

## 2021-05-13 ENCOUNTER — Ambulatory Visit (INDEPENDENT_AMBULATORY_CARE_PROVIDER_SITE_OTHER): Payer: PPO | Admitting: Pharmacist

## 2021-05-13 DIAGNOSIS — I5022 Chronic systolic (congestive) heart failure: Secondary | ICD-10-CM

## 2021-05-13 DIAGNOSIS — I10 Essential (primary) hypertension: Secondary | ICD-10-CM | POA: Diagnosis not present

## 2021-05-13 MED ORDER — EMPAGLIFLOZIN 10 MG PO TABS: 10.0000 mg | ORAL_TABLET | Freq: Every day | ORAL | 0 refills | Status: DC

## 2021-05-13 NOTE — Progress Notes (Signed)
Chronic Care Management Pharmacy Note  05/13/2021 Name:  Nicholas Wilkerson MRN:  [id number] DOB:  1941/06/17  Summary: BP at goal < 130/80 LDL at goal < 70   Recommendations/Changes made from today's visit: -Recommend continued routine BP monitoring at home   Plan: Follow up in 4 months in office to renew PAP and possible Jardiance samples  Subjective: Nicholas Wilkerson is an 80 y.o. year old male who is a primary patient of Laurey Morale, MD.  The CCM team was consulted for assistance with disease management and care coordination needs.    Engaged with patient face to face for follow up visit in response to provider referral for pharmacy case management and/or care coordination services.   Consent to Services:  The patient was given information about Chronic Care Management services, agreed to services, and gave verbal consent prior to initiation of services.  Please see initial visit note for detailed documentation.   Patient Care Team: Laurey Morale, MD as PCP - General (Family Medicine) [name], MD as PCP - Electrophysiology (Cardiology) Heath Lark, MD as Consulting Physician (Hematology and Oncology) Melissa Montane, MD as Consulting Physician (Otolaryngology) Eppie Gibson, MD as Attending Physician (Radiation Oncology) Holley Bouche, NP (Inactive) as Nurse Practitioner (Nurse Practitioner) [name], [name], MD as Consulting Physician (Cardiology) [place], Heather, [place] (Optometry) Viona Gilmore, [facility] as Pharmacist (Pharmacist)  Recent office visits: 04/27/21 Alysia Penna, MD: Patient presented for elevated glucose in urine. A1c of 5.9%.  03/16/21 Alysia Penna, MD: Patient presented for chronic low back pain. Plan for lumbar MRI.  02/25/21 Alysia Penna, MD: Patient presented for chronic low back pain.  Prescribed methocarbamol and oxycodone as needed. Also added a medrol dosepak.  02/17/21 Alysia Penna, MD: Patient presented for chronic low neck pain. Referred to  PT.  Recent consult visits: 04/07/21 Carl Best, NP: Patient presented for rectal bleeding and constipation. Continue miralax nightly. Recommended Pepcid 20 mg OTC while taking prednisone.  03/30/21 Hessie Diener, PTA (outpatient rehab): Patient presented for PT session.  02/16/21 Peter [name], MD (cardiology): Patient presented for HF follow up. Plan for ICD placement. Decreased carvedilol.  01/20/21 Allegra Lai, MD (cardiology): Patient presented for electrophysiology evaluation. Plan to proceed with ICD implant.  01/13/21 Charlott Holler (dentist): Patient presented for oral hygiene exam.  12/08/20 Heather [place] (optometry): Unable to access notes.  11/19/20 Peter [name], MD (cardiology): Patient presented for CHF follow up. Prescribed Jardiance 10 mg daily.  10/07/20 Raquel Rodriguez-Guzman, CPP (cardiology): Patient presented for med titration. Applied for Jardiance PAP.  Hospital visits: 04/02/21 Patient admitted for ICD placement.  Objective:  Lab Results  Component Value Date   CREATININE 1.72 (H) 03/27/2021   BUN 35 (H) 03/27/2021   GFR 59.42 (L) 10/15/2019   GFRNONAA 54 (L) 10/07/2020   GFRAA 63 10/07/2020   NA 138 03/27/2021   K 4.6 03/27/2021   CALCIUM 9.3 03/27/2021   CO2 23 03/27/2021   GLUCOSE 102 (H) 03/27/2021    Lab Results  Component Value Date/Time   HGBA1C 5.9 (A) 04/27/2021 11:00 AM   HGBA1C 6.3 10/15/2020 11:51 AM   HGBA1C 5.5 05/27/2020 11:38 AM   GFR 59.42 (L) 10/15/2019 10:58 AM   GFR 70.83 09/29/2018 09:53 AM    Last diabetic Eye exam: No results found for: HMDIABEYEEXA  Last diabetic Foot exam: No results found for: HMDIABFOOTEX   Lab Results  Component Value Date   CHOL 106 02/12/2021   HDL 41 02/12/2021   [place]  52 02/12/2021   TRIG 57 02/12/2021   CHOLHDL 2.6 02/12/2021    Hepatic Function Latest Ref Rng & Units 07/15/2020 10/15/2019 09/29/2018  Total Protein 6.0 - 8.5 g/dL 6.7 6.3 6.5  Albumin 3.7 - 4.7 g/dL 4.7 4.3 4.4  AST 0 -  40 IU/L 20 16 17   ALT 0 - 44 IU/L 21 16 19   Alk Phosphatase 44 - 121 IU/L 85 69 63  Total Bilirubin 0.0 - 1.2 mg/dL 0.5 0.6 0.6  Bilirubin, Direct 0.0 - 0.3 mg/dL - 0.1 0.1    Lab Results  Component Value Date/Time   TSH 1.89 10/15/2020 11:51 AM   TSH 1.45 05/27/2020 11:38 AM   FREET4 1.02 10/15/2020 11:51 AM   FREET4 1.4 05/27/2020 11:38 AM    CBC Latest Ref Rng & Units 03/27/2021 02/12/2021 07/22/2020  WBC 3.4 - 10.8 x10E3/uL 10.7 7.9 -  Hemoglobin 13.0 - 17.7 g/dL 16.1 15.6 18.0(H)  Hematocrit 37.5 - 51.0 % 48.1 46.5 53.0(H)  Platelets 150 - 450 x10E3/uL 225 209 -    No results found for: VD25OH  Clinical ASCVD: Yes  The ASCVD Risk score Mikey Bussing DC Jr., et al., 2013) failed to calculate for the following reasons:   The valid total cholesterol range is 130 to 320 mg/dL    Depression screen [facility] 2/9 10/15/2020 05/26/2020 10/24/2018  Decreased Interest 0 0 0  Down, Depressed, Hopeless 0 0 0  PHQ - 2 Score 0 0 0  Altered sleeping - 0 0  Tired, decreased energy - 0 0  Change in appetite - 0 0  Feeling bad or failure about yourself  - 0 0  Trouble concentrating - 0 0  Moving slowly or fidgety/restless - 0 0  Suicidal thoughts - 0 0  PHQ-9 Score - 0 0  Difficult doing work/chores - Not difficult at all -  Some recent data might be hidden      Social History   Tobacco Use  Smoking Status Never  Smokeless Tobacco Never   BP Readings from Last 3 Encounters:  04/27/21 120/80  04/07/21 100/62  04/02/21 (!) 106/35   Pulse Readings from Last 3 Encounters:  04/27/21 64  04/07/21 60  04/02/21 76   Wt Readings from Last 3 Encounters:  04/27/21 154 lb (69.9 kg)  04/07/21 157 lb 1.6 oz (71.3 kg)  04/02/21 155 lb (70.3 kg)   BMI Readings from Last 3 Encounters:  04/27/21 22.74 kg/m  04/07/21 23.20 kg/m  04/02/21 22.89 kg/m    Assessment/Interventions: Review of patient past medical history, allergies, medications, health status, including review of consultants reports,  laboratory and other test data, was performed as part of comprehensive evaluation and provision of chronic care management services.   SDOH:  (Social Determinants of Health) assessments and interventions performed: No  SDOH Screenings   Alcohol Screen: Low Risk    Last Alcohol Screening Score (AUDIT): 1  Depression (PHQ2-9): Low Risk    PHQ-2 Score: 0  Financial Resource Strain: Medium Risk   Difficulty of Paying Living Expenses: Somewhat hard  Food Insecurity: No Food Insecurity   Worried About Charity fundraiser in the Last Year: Never true   Ran Out of Food in the Last Year: Never true  Housing: Low Risk    Last Housing Risk Score: 0  Physical Activity: Inactive   Days of Exercise per Week: 0 days   Minutes of Exercise per Session: 0 min  Social Connections: Moderately Integrated   Frequency of Communication with  Friends and Family: More than three times a week   Frequency of Social Gatherings with Friends and Family: More than three times a week   Attends Religious Services: More than 4 times per year   Active Member of [employer] or Organizations: No   Attends Music therapist: Never   Marital Status: Married  Stress: No Stress Concern Present   Feeling of Stress : Not at all  Tobacco Use: Low Risk    Smoking Tobacco Use: Never   Smokeless Tobacco Use: Never  Transportation Needs: No Transportation Needs   Lack of Transportation (Medical): No   Lack of Transportation (Non-Medical): No    CCM Care Plan  No Known Allergies  Medications Reviewed Today     Reviewed by Laurey Morale, MD (Physician) on 04/27/21 at 59  Med List Status: <None>   Medication Order Taking? Sig Documenting Provider Last Dose Status Informant  aspirin EC 81 MG tablet [id number] Yes Take 81 mg by mouth daily. [provider] Taking Active Multiple Informants  atorvastatin (LIPITOR) 40 MG tablet [id number] Yes Take 1 tablet (40 mg total) by mouth daily. Laurey Morale, MD  Taking Active Multiple Informants  B Complex-C (B-COMPLEX WITH VITAMIN C) tablet [id number] No Take 1 tablet by mouth once a week.  Patient not taking: Reported on 04/27/2021   [provider] Not Taking Active   carvedilol (COREG) 25 MG tablet [id number] Yes Take 1 tablet (25 mg total) by mouth 2 (two) times daily. [name], [name], MD Taking Active Multiple Informants  diclofenac (VOLTAREN) 75 MG EC tablet [id number] No Take 1 tablet (75 mg total) by mouth 2 (two) times daily.  Patient not taking: Reported on 04/27/2021   Laurey Morale, MD Not Taking Active   dorzolamide-timolol (COSOPT) 22.3-6.8 MG/ML ophthalmic solution [id number] Yes Place 1 drop into both eyes 2 (two) times daily. [provider] Taking Active Multiple Informants  empagliflozin (JARDIANCE) 10 MG TABS tablet [id number] Yes Take 1 tablet (10 mg total) by mouth daily before breakfast. [name], [name], MD Taking Active Multiple Informants  fluticasone (FLONASE) 50 MCG/ACT nasal spray [id number] Yes Place 2 sprays into both nostrils daily. Laurey Morale, MD Taking Active   furosemide (LASIX) 40 MG tablet [id number] Yes Take 1 tablet (40 mg total) by mouth daily. [name], [name], MD Taking Active Multiple Informants  hydrocortisone (PROCTOZONE-HC) 2.5 % rectal cream [id number] No Place 1 application rectally 2 (two) times daily.  Patient not taking: Reported on 04/27/2021   Laurey Morale, MD Not Taking Active   latanoprost (XALATAN) 0.005 % ophthalmic solution [id number] Yes Place 1 drop into both eyes at bedtime.  [provider] Taking Active Multiple Informants  levothyroxine (SYNTHROID) 75 MCG tablet [id number] Yes Take 1 tablet by mouth once daily Laurey Morale, MD Taking Active   Melatonin 10 MG TABS [id number] Yes Take 10 mg by mouth at bedtime.  [provider] Taking Active Multiple Informants  sacubitril-valsartan (ENTRESTO) 49-51 MG [id number] Yes Take 1 tablet by mouth 2 (two) times daily.  [name], [name], MD Taking Active Multiple Informants  sodium fluoride (FLUORISHIELD) 1.1 % GEL dental gel [id number] Yes Instill one drop of gel per tooth space of fluoride tray. Place over teeth for 5 minutes. Remove. Spit out excess. Repeat nightly. Lenn Cal, DDS Taking Active Self  sodium fluoride (SODIUM FLUORIDE 5000 PPM) 1.1 % GEL dental gel [id number] Yes Place 1 application onto teeth at bedtime. Charlaine Dalton, DMD  Taking Active Multiple Informants  spironolactone (ALDACTONE) 25 MG tablet [id number] Yes Take 1 tablet (25 mg total) by mouth daily. [name], [name], MD Taking Active Multiple Informants  tamsulosin [facility]) 0.4 MG CAPS capsule [id number] Yes Take 1 capsule (0.4 mg total) by mouth daily. Laurey Morale, MD Taking Active             Patient Active Problem List   Diagnosis Date Noted   Constipation 04/08/2021   COVID-19 virus infection 03/16/2021   Elevated PSA 02/28/2020   Hypothyroidism 12/13/2019   Nonischemic cardiomyopathy ([place]) 09/28/2019   Personal history of malignant neoplasm of tongue 12/15/2015   Insomnia 06/11/2015   Skin infection at gastrostomy tube site ([place]) 06/09/2015   Depression, acute 03/24/2015   Palpitations 01/24/2015   History of skin cancer 12/03/2014   Cancer of base of tongue ([place]) 12/02/2014   Acute on chronic systolic CHF (congestive heart failure) ([place]) 07/12/2013   MUSCLE STRAIN, ABDOMINAL WALL 11/28/2008   ARTHRITIS 10/22/2008   BURSITIS, LEFT SHOULDER 07/24/2008   Internal hemorrhoids 12/08/2007   EXTERNAL HEMORRHOIDS 12/08/2007   DIVERTICULOSIS, COLON 12/08/2007   RECTAL BLEEDING 12/08/2007   History of cardiovascular disorder 12/08/2007   Congestive dilated cardiomyopathy ([place]) 06/13/2007   HX, PERSONAL, ARTHRITIS 06/13/2007   Hypercholesterolemia 05/15/2007   Essential hypertension 05/15/2007    Immunization History  Administered Date(s) Administered   Fluad Quad(high Dose 65+) 10/15/2019, 10/15/2020    Influenza Split 08/11/2011, 08/31/2012   Influenza Whole 08/18/2007, 07/11/2008, 08/12/2009, 06/25/2010   Influenza, High Dose Seasonal PF 07/31/2013, 07/01/2016, 07/18/2017, 08/09/2018   Influenza,inj,Quad PF,6+ Mos 07/31/2014, 06/13/2015   Janssen (J&J) SARS-COV-2 Vaccination 02/09/2020   Pneumococcal Conjugate-13 09/23/2016   Pneumococcal Polysaccharide-23 06/19/2009   Td 06/27/2008   Zoster Recombinat (Shingrix) 02/21/2018   Zoster, Live 07/11/2008   Patient reports he is no longer having constipation and is feeling much better post surgery. He stopped taking the miralax as this is no longer needed.   Conditions to be addressed/monitored:  Hypertension, Hyperlipidemia, Heart Failure, Hypothyroidism, Osteoarthritis and Glaucoma, Insomnia  Conditions addressed this visit: Hypertension, heart failure  Care Plan : [place]  Updates made by Viona Gilmore, [place] since 05/13/2021 12:00 AM     Problem: Problem: Hypertension, Hyperlipidemia, Heart Failure, Hypothyroidism, Osteoarthritis and Glaucoma, Insomnia      Long-Range Goal: Patient-Specific Goal   Start Date: 01/28/2021  Expected End Date: 01/28/2022  Recent Progress: On track  Priority: High  Note:   Current Barriers:  Unable to independently afford treatment regimen Unable to independently monitor therapeutic efficacy  Pharmacist Clinical Goal(s):  Patient will achieve adherence to monitoring guidelines and medication adherence to achieve therapeutic efficacy maintain control of blood pressure as evidenced by home blood pressure readings  through collaboration with PharmD and provider.   Interventions: 1:1 collaboration with Laurey Morale, MD regarding development and update of comprehensive plan of care as evidenced by provider attestation and co-signature Inter-disciplinary care team collaboration (see longitudinal plan of care) Comprehensive medication review performed; medication list updated in  electronic medical record  Hypertension (BP goal <130/80) -Controlled -Current treatment: Carvedilol 67m, 1 tablet twice daily  Entresto 49/51 mg, 1 tablet twice daily Spironolactone 25 mg 1 tablet daily -Medications previously tried: ramipril (low BP)    -Current home readings: 95-100/68 (2-3 times a week) -Current dietary habits: did not discuss -Current exercise habits: patient plays golf and remains active throughout the day -Denies hypotensive/hypertensive symptoms -Educated on Exercise goal of 150 minutes per  week; Importance of home blood pressure monitoring; Proper BP monitoring technique; -Counseled to monitor BP at home weekly, document, and provide log at future appointments -Counseled on diet and exercise extensively Recommended to continue current medication Educated on importance of taking carvedilol with food  Hyperlipidemia: (LDL goal < 70) -Controlled -Current treatment: Atorvastatin 40 mg 1 tablet daily -Medications previously tried: simvastatin (unknown)  -Current dietary patterns: Patient notes avoids fried food and aim to eat baked fish. Avoids junk food. -Current exercise habits: active throughout the day -Educated on Cholesterol goals;  Benefits of statin for ASCVD risk reduction; Importance of limiting foods high in cholesterol; Exercise goal of 150 minutes per week; -Counseled on diet and exercise extensively Recommended to continue current medication  CAD (Goal: prevent heart attacks and strokes) -Controlled -Current treatment  Atorvastatin 40 mg 1 tablet daily Aspirin 75m, 1 tablet daily -Medications previously tried: none  -Recommended to continue current medication  Pre-diabetes (A1c goal <6.5%) -Controlled -Current medications: Jardiance 10 mg 1 tablet daily -Medications previously tried: none  -Current home glucose readings fasting glucose: does not check post prandial glucose: does not check -Denies hypoglycemic/hyperglycemic  symptoms -Current meal patterns:  breakfast: did not discuss lunch: did not discuss  dinner: did not discuss snacks: did not discuss drinks: did not discuss -Current exercise: active throughout the day -Educated on A1c and blood sugar goals; Exercise goal of 150 minutes per week; Carbohydrate counting and/or plate method -Counseled to check feet daily and get yearly eye exams -Counseled on diet and exercise extensively Patient reported he ate "a lot of stuff he shouldn't" before this was checked and has improve since then  Heart Failure (Goal: manage symptoms and prevent exacerbations) -Controlled -Last ejection fraction: 20-25% (Date: 07/2020) -HF type: Systolic -NYHA Class: II (slight limitation of activity) -AHA HF Stage: C (Heart disease and symptoms present) -Current treatment: Carvedilol 221m 1 tablet twice daily  Entresto 49/51 mg, 1 tablet twice daily Spironolactone 25 mg 1 tablet daily Jardiance 10 mg 1 tablet daily Furosemide 40 mg 1 tablet daily -Medications previously tried: ramipril (low BP) -Current home BP/HR readings: lower normal range -Current dietary habits: limiting salt intake -Current exercise habits: stays active -Educated on Benefits of medications for managing symptoms and prolonging life Importance of weighing daily; if you gain more than 3 pounds in one day or 5 pounds in one week, call cardiologist -Counseled on diet and exercise extensively Recommended to continue current medication  Hypothyroidism (Goal: TSH 0.35-4.5) -Controlled -Current treatment  levothyroxine 7576m 1 tablet once daily -Medications previously tried: none  -Recommended to continue current medication  Insomnia (Goal: improve quality and quantity of sleep) -Controlled -Current treatment  melatonin 98m41m tablet at night -Medications previously tried: none  -Recommended to continue current medication  BPH (Goal: minimize symptoms) -Controlled -Current treatment   Tamsulosin 0.4mg,51mcapsule daily -Medications previously tried: none  -Recommended to continue current medication  Glaucoma (Goal: lower intraocular pressure) -Controlled -Current treatment  Brimodine use as directed Latanoprost 1 drop at bedtime -Medications previously tried: none  -Recommended to continue current medication  Health Maintenance -Vaccine gaps: tetanus, COVID booster, second dose of shingrix -Current therapy:  Vitamin B complex daily -Educated on Cost vs benefit of each product must be carefully weighed by individual consumer -Patient is satisfied with current therapy and denies issues -Recommended to continue current medication  Patient Goals/Self-Care Activities Patient will:  - take medications as prescribed check blood pressure weekly, document, and provide at future appointments weigh daily, and  contact provider if weight gain of > 3 lbs in one day or > 5 lbs in one week  Follow Up Plan: Face to Face appointment with care management team member scheduled for:  4 months          Medication Assistance:  Entresto obtained through [employer] medication assistance program.  Enrollment ends 10/10/21  Compliance/Adherence/Medication fill history: Care Gaps: Shingrix, tetanus, COVID booster, influenza vaccine, Hep C screening   Star-Rating Drugs: Atorvastatin 37m - [facility] [place] - [address] [place] ([place] [id number]: [phone]: [phone]hone: [phone]: [phone] pill box? Yes Pt endorses 100% compliance  We discussed: Current pharmacy is preferred with insurance plan and patient is satisfied with pharmacy services Patient decided to: Continue current  medication management strategy  Care Plan and Follow Up Patient Decision:  Patient agrees to Care Plan and Follow-up.  Plan: Telephone follow up appointment with care management team member scheduled for:  4 months  MaJeni SallesPharmD BCParkerharmacist LeCopemisht [phone]

## 2021-06-02 ENCOUNTER — Telehealth: Payer: PPO

## 2021-06-11 ENCOUNTER — Encounter: Payer: PPO | Admitting: Cardiology

## 2021-06-21 ENCOUNTER — Other Ambulatory Visit: Payer: Self-pay | Admitting: Family Medicine

## 2021-06-28 ENCOUNTER — Other Ambulatory Visit: Payer: Self-pay | Admitting: Cardiology

## 2021-06-28 ENCOUNTER — Other Ambulatory Visit: Payer: Self-pay | Admitting: Family Medicine

## 2021-07-03 ENCOUNTER — Ambulatory Visit (INDEPENDENT_AMBULATORY_CARE_PROVIDER_SITE_OTHER): Payer: PPO

## 2021-07-03 DIAGNOSIS — I428 Other cardiomyopathies: Secondary | ICD-10-CM | POA: Diagnosis not present

## 2021-07-06 ENCOUNTER — Telehealth: Payer: Self-pay

## 2021-07-06 DIAGNOSIS — I472 Ventricular tachycardia: Secondary | ICD-10-CM

## 2021-07-06 DIAGNOSIS — Z79899 Other long term (current) drug therapy: Secondary | ICD-10-CM

## 2021-07-06 DIAGNOSIS — I4729 Other ventricular tachycardia: Secondary | ICD-10-CM

## 2021-07-06 LAB — CUP PACEART REMOTE DEVICE CHECK
Battery Remaining Longevity: 113 mo
Battery Remaining Percentage: 95 %
Battery Voltage: 3.08 V
Brady Statistic RV Percent Paced: 1 %
Date Time Interrogation Session: 20220923020158
HighPow Impedance: 71 Ohm
Implantable Lead Implant Date: 20220623
Implantable Lead Location: 753860
Implantable Lead Model: 7122
Implantable Pulse Generator Implant Date: 20220623
Lead Channel Impedance Value: 440 Ohm
Lead Channel Pacing Threshold Amplitude: 0.5 V
Lead Channel Pacing Threshold Pulse Width: 0.5 ms
Lead Channel Sensing Intrinsic Amplitude: 11.8 mV
Lead Channel Setting Pacing Amplitude: 3.5 V
Lead Channel Setting Pacing Pulse Width: 0.5 ms
Lead Channel Setting Sensing Sensitivity: 0.5 mV
Pulse Gen Serial Number: 810027079

## 2021-07-06 NOTE — Telephone Encounter (Signed)
Called patient to advise need blood work per Dr. Curt Bears. Patient has apt. 07/10/21 w/ Dr. [name] at [facility] office and states he will have blood work then. Advised patient I do not see any orders at this time so I will consult with Dr. Curt Bears nurse for recommendations since patient will not be at [facility] office for blood work.

## 2021-07-06 NOTE — Telephone Encounter (Signed)
Scheduled remote reviewed. Normal device function.  3  NSVT episodes all x 6 sec. @ 170's -190's bpm. > 20 beats.  Patient reports asymptomatic and compliant with medications. Has upcoming apt. 07/16/21. Aware of apt. Routing to Dr. Unk Lightning for review.

## 2021-07-07 NOTE — Telephone Encounter (Signed)
Orders for labs placed in Epic. Note added to Dr. Morrison Old appt notes. Will forward to Malachy Mood, RN to ensure lab work gets done while pt there seeing MD.

## 2021-07-08 NOTE — Progress Notes (Signed)
[name] Date of Birth: 1941-08-07   History of Present Illness: Kendal is seen for followup. He has a history of dilated cardiomyopathy with initial ejection fraction of 30% over 18 years ago. Subsequent evaluation has demonstrated improvement to 50-55%. Last Echo was in January 2018 showing decrease in EF to 35-40%. ACEi was resumed at that time. . 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 He was treated with RT and chemo.   Last November he complained of some palpitations. Event monitor short short bursts of SVT and NSVT. No bradycardia or pauses. We increased his Coreg dose at that time.  He was seen in February and he noted he had extensive Urologic evaluation for an elevated PSA with no cancer found. He did complain of getting tired real easy with activity.  No edema. No chest pain. He does note some change in his breathing.  We updated an Echocardiogram which demonstrated significant decrease in LV function with  EF down to 20-25%. There was also evidence of significant pulmonary HTN.   We performed cardiac cath showing nonobstructive CAD. Moderately elevated LV filling pressures. Moderate pulmonary HTN.  He was started on Lasix 40 mg daily. Continued Coreg and Jardiance. Added Entresto. Now up to 49/51 mg bid. Added aldactone 25 mg daily. He is tolerating this well. He has noted that if he takes everything at once in the morning he gets real lightheaded. He states this is much better.   Still active walking and playing golf.   After optimization of medication Echo was repeated and was unchanged with EF 20-25%. He was seen by Dr Curt Bears and ICD was implanted in June. Since then on remote monitoring he has 3 episodes of NSVT the longest lasting 6 seconds. He was unaware. He states he feels great. No cardiac limitations. Did have acute back pain that kept him in bed for a week but this is better.    Current Outpatient Medications on File Prior to Visit  Medication  Sig Dispense Refill   aspirin EC 81 MG tablet Take 81 mg by mouth daily.     atorvastatin (LIPITOR) 40 MG tablet Take 1 tablet (40 mg total) by mouth daily. 90 tablet 3   B Complex-C (B-COMPLEX WITH VITAMIN C) tablet Take 1 tablet by mouth once a week.     carvedilol (COREG) 25 MG tablet TAKE 1 TABLET BY MOUTH TWICE DAILY WITH A MEAL. TAKE 25 MG 180 tablet 0   diclofenac (VOLTAREN) 75 MG EC tablet Take 1 tablet (75 mg total) by mouth 2 (two) times daily. 180 tablet 3   dorzolamide-timolol (COSOPT) 22.3-6.8 MG/ML ophthalmic solution Place 1 drop into both eyes 2 (two) times daily.     empagliflozin (JARDIANCE) 10 MG TABS tablet Take 1 tablet (10 mg total) by mouth daily before breakfast. 90 tablet 3   fluticasone (FLONASE) 50 MCG/ACT nasal spray Place 2 sprays into both nostrils daily. 16 g 11   furosemide (LASIX) 40 MG tablet Take 1 tablet (40 mg total) by mouth daily. 90 tablet 3   hydrocortisone (PROCTOZONE-HC) 2.5 % rectal cream Place 1 application rectally 2 (two) times daily. 28 g 5   latanoprost (XALATAN) 0.005 % ophthalmic solution Place 1 drop into both eyes at bedtime.      levothyroxine (SYNTHROID) 75 MCG tablet Take 1 tablet by mouth once daily 90 tablet 0   Melatonin 10 MG TABS Take 10 mg by mouth at bedtime.  sacubitril-valsartan (ENTRESTO) 49-51 MG Take 1 tablet by mouth 2 (two) times daily. 180 tablet 3   sodium fluoride (FLUORISHIELD) 1.1 % GEL dental gel PLACE 1 APPLICATION ONTO THE TEETH AT BEDTIME 112 mL 11   spironolactone (ALDACTONE) 25 MG tablet Take 1 tablet (25 mg total) by mouth daily. 90 tablet 3   tamsulosin (FLOMAX) 0.4 MG CAPS capsule Take 1 capsule by mouth once daily 90 capsule 0   No current facility-administered medications on file prior to visit.    No Known Allergies  Past Medical History:  Diagnosis Date   ARTHRITIS 10/22/2008   BPH (benign prostatic hyperplasia)    CHF (congestive heart failure) ([place])    sees Dr. Gevon Markus [name]    Dilated  cardiomyopathy [facility])    DIVERTICULOSIS, COLON 12/08/2007   Glaucoma    sees Dr. Heather [place]    H/O asbestos exposure    History of echocardiogram 05/22/2007   EF was 45-50% / Mild concentric LV hypertrophy with mild global hypokinesis and overall mild systolic dysfunction .  Mild AV sclerosis / Mild Mitral insufficiency / compared to prior study 04/24/02 -- LV function has improved further.     History of kidney stones    Hypercholesterolemia    Hypertension    Insomnia 06/11/2015   Radiation 01/01/15-02/18/15   base of tongue and bilateral neck 70 Gy   Skin cancer    squamous cell, basal cell   Squamous cell carcinoma 11/20/14   base of tongue primary   Throat cancer ([place]) 10/2014   had chemo   Thyroid disease     Past Surgical History:  Procedure Laterality Date   CARDIAC CATHETERIZATION  10/17/2001   EF estimated at 30% / moderate LV enlargement  / 1. Minimal nonobstructive atherosclerotic coronary artery disease / 2. Severe LV dysfunction with global hypokinesia consistent with dilated nonischemic cardiomyopath / 3. Moderate pulmonary hypertension   COLONOSCOPY  06/23/2017   per Dr. Carlean Purl, clear, no repeats needed    ICD IMPLANT N/A 04/02/2021   Procedure: ICD IMPLANT;  Surgeon: [name], MD;  Location: [place] [facility];  Service: Cardiovascular;  Laterality: N/A;   LYMPH NODE BIOPSY     RIGHT/LEFT HEART CATH AND CORONARY ANGIOGRAPHY N/A 07/22/2020   Procedure: RIGHT/LEFT HEART CATH AND CORONARY ANGIOGRAPHY;  Surgeon: [name], Malcom Selmer M, MD;  Location: [place] [facility];  Service: Cardiovascular;  Laterality: N/A;    Social History   Tobacco Use  Smoking Status Never  Smokeless Tobacco Never    Social History   Substance and Sexual Activity  Alcohol Use Yes   Alcohol/week: 0.0 standard drinks   Comment: occasional beer, a couple times a month    Family History  Problem Relation Age of Onset   Stroke Father    Cancer Father        kidney ca   Angina  Mother    Colon cancer Neg Hx    Esophageal cancer Neg Hx    Rectal cancer Neg Hx    Stomach cancer Neg Hx     Review of Systems:  All other systems were reviewed and are negative.  Physical Exam: BP 115/80 (BP Location: Left Arm)   Pulse (!) 49   Ht 5\' 9"  (1.753 m)   Wt 165 lb 9.6 oz (75.1 kg)   SpO2 96%   BMI 24.45 kg/m  GENERAL:  Well appearing WM in NAD HEENT:  PERRL, EOMI, sclera are clear. Oropharynx is clear. NECK:  No jugular  venous distention, carotid upstroke brisk and symmetric, no bruits, no thyromegaly or adenopathy LUNGS:  Clear to auscultation bilaterally CHEST:  Unremarkable HEART:  RRR with extrasystoles,  PMI not displaced or sustained,S1 and S2 within normal limits, no S3, no S4: no clicks, no rubs, no murmurs ABD:  Soft, nontender. BS +, no masses or bruits. No hepatomegaly, no splenomegaly EXT:  2 + pulses throughout, no edema, no cyanosis no clubbing SKIN:  Warm and dry.  No rashes NEURO:  Alert and oriented x 3. Cranial nerves II through XII intact. PSYCH:  Cognitively intact   LABORATORY DATA:  Lab Results  Component Value Date   WBC 10.7 03/27/2021   HGB 16.1 03/27/2021   HCT 48.1 03/27/2021   PLT 225 03/27/2021   GLUCOSE 102 (H) 03/27/2021   CHOL 106 02/12/2021   TRIG 57 02/12/2021   HDL 41 02/12/2021   LDLCALC 52 02/12/2021   ALT 21 07/15/2020   AST 20 07/15/2020   NA 138 03/27/2021   K 4.6 03/27/2021   CL 100 03/27/2021   CREATININE 1.72 (H) 03/27/2021   BUN 35 (H) 03/27/2021   CO2 23 03/27/2021   TSH 1.89 10/15/2020   PSA 7.93 (H) 10/15/2019   INR 0.95 07/17/2015   HGBA1C 5.9 (A) 04/27/2021     Echo 11/02/16: Study Conclusions   - Left ventricle: The cavity size was mildly dilated. Wall   thickness was normal. Systolic function was moderately reduced.   The estimated ejection fraction was in the range of 35% to 40%.   Diffuse hypokinesis. Echogenic linear structure at the apex,   suggestive of calcified apical false tendon.  Doppler parameters   are consistent with pseudonormal left ventricular relaxation   (grade 2 diastolic dysfunction). The E/e&' ratio is >15,   suggesting elevated LV filling pressure. - Mitral valve: Mildly thickened leaflets . There was mild   regurgitation. - Left atrium: The atrium was mildly dilated. - Inferior vena cava: The vessel was dilated. The respirophasic   diameter changes were blunted (< 50%), consistent with elevated   central venous pressure.   Impressions:   - Compared to a prior study in 2012, the LVEF is lower at 35-40%.   The LV is mildly dilated and globally hypokinetic. LV filling   pressure appears elevated with grade 2 DD.  Echo 07/14/20: 1. Left ventricular ejection fraction, by estimation, is 20 to 25%. The left ventricle has severely decreased function. The left ventricle demonstrates global hypokinesis. The left ventricular internal cavity size was mildly dilated. Left ventricular diastolic parameters are consistent with Grade III diastolic dysfunction (restrictive). Elevated left ventricular end-diastolic pressure. 2. Right ventricular systolic function is normal. The right ventricular size is normal. There is severely elevated pulmonary artery systolic pressure. 3. Left atrial size was severely dilated. 4. The mitral valve is normal in structure. Mild mitral valve regurgitation. No evidence of mitral stenosis. 5. The aortic valve is tricuspid. There is mild calcification of the aortic valve. There is mild thickening of the aortic valve. Aortic valve regurgitation is not visualized. No aortic stenosis is present. 6. The inferior vena cava is normal in size with <50% respiratory variability, suggesting right atrial pressure of 8 mmHg.  Cardiac cath 07/22/20:  RIGHT/LEFT HEART CATH AND CORONARY ANGIOGRAPHY  Conclusion    Prox LAD to Mid LAD lesion is 25% stenosed. Prox Cx to Dist Cx lesion is 25% stenosed. Prox RCA to Mid RCA lesion is 15%  stenosed. LV end diastolic pressure is moderately  elevated. Hemodynamic findings consistent with moderate pulmonary hypertension.   1. Nonobstructive CAD 2. Low cardiac output. Index 1.86 3. Moderately elevated LV filling pressures 4. Moderate Pulmonary HTN   Plan: optimize medical therapy for CHF. On Coreg. Just started [place]. Will add lasix 40 mg daily. Further titration of medication depending on initial response.    Event monitor 10/03/19: Study Highlights  Normal sinus rhythm Occasional runs of NSVT longest lasting 7 beats. Infrequent runs of SVT longest 9 beats. No significant bradycardia or pauses Average HR 71 bpm     Echo 01/05/21: IMPRESSIONS     1. Left ventricular ejection fraction, by estimation, is 20 to 25%. The  left ventricle has severely decreased function. The left ventricle  demonstrates global hypokinesis. The left ventricular internal cavity size  was mildly to moderately dilated. Left  ventricular diastolic parameters are consistent with Grade II diastolic  dysfunction (pseudonormalization). Elevated left atrial pressure.   2. Right ventricular systolic function is normal. The right ventricular  size is normal.   3. Left atrial size was mild to moderately dilated.   4. The mitral valve is normal in structure. Mild mitral valve  regurgitation.   5. The aortic valve is tricuspid. Aortic valve regurgitation is not  visualized. Mild aortic valve sclerosis is present, with no evidence of  aortic valve stenosis.   6. The inferior vena cava is normal in size with greater than 50%  respiratory variability, suggesting right atrial pressure of 3 mmHg.   Comparison(s): The left ventricular function is unchanged.    Assessment / Plan:  1. Dilated cardiomyopathy with EF 35-40% in 2018.   Echo shows significant decline in LV function with EF 20-25%.  Symptoms have improved with change in medication. Now class 1.  Will continue Coreg at 25 mg bid,  Entresto  49/51 mg bid, aldactone 25 mg daily and lasix.  Jardiance 10 mg daily. Repeat Echo showed no change in EF but clinically he is doing well. S/p ICD implant followed by Dr Curt Bears. Will check BMET and Mg level today. Would consider reducing lasix to 20 mg daily.   2. Hypertension well controlled.  3. PVCs/brief SVT/NSVT.  Continue carvedilol but at lower dose 25 mg bid. Follow up with EP  4. SSCA of the tongue. S/p RT and chemo.    Follow up in 4 months

## 2021-07-08 NOTE — Progress Notes (Signed)
Remote ICD transmission.   

## 2021-07-10 ENCOUNTER — Other Ambulatory Visit: Payer: Self-pay

## 2021-07-10 ENCOUNTER — Encounter: Payer: Self-pay | Admitting: Cardiology

## 2021-07-10 ENCOUNTER — Ambulatory Visit: Payer: PPO | Admitting: Cardiology

## 2021-07-10 VITALS — BP 115/80 | HR 49 | Ht 69.0 in | Wt 165.6 lb

## 2021-07-10 DIAGNOSIS — I5022 Chronic systolic (congestive) heart failure: Secondary | ICD-10-CM

## 2021-07-10 DIAGNOSIS — I10 Essential (primary) hypertension: Secondary | ICD-10-CM

## 2021-07-10 DIAGNOSIS — E7800 Pure hypercholesterolemia, unspecified: Secondary | ICD-10-CM | POA: Diagnosis not present

## 2021-07-10 DIAGNOSIS — I472 Ventricular tachycardia: Secondary | ICD-10-CM

## 2021-07-10 DIAGNOSIS — I5023 Acute on chronic systolic (congestive) heart failure: Secondary | ICD-10-CM

## 2021-07-10 DIAGNOSIS — I428 Other cardiomyopathies: Secondary | ICD-10-CM

## 2021-07-10 DIAGNOSIS — I4729 Other ventricular tachycardia: Secondary | ICD-10-CM

## 2021-07-10 NOTE — Progress Notes (Signed)
Bmet 

## 2021-07-11 LAB — BASIC METABOLIC PANEL
BUN/Creatinine Ratio: 16 (ref 10–24)
BUN: 20 mg/dL (ref 8–27)
CO2: 25 mmol/L (ref 20–29)
Calcium: 9.6 mg/dL (ref 8.6–10.2)
Chloride: 103 mmol/L (ref 96–106)
Creatinine, Ser: 1.27 mg/dL (ref 0.76–1.27)
Glucose: 108 mg/dL — ABNORMAL HIGH (ref 70–99)
Potassium: 4.3 mmol/L (ref 3.5–5.2)
Sodium: 142 mmol/L (ref 134–144)
eGFR: 58 mL/min/{1.73_m2} — ABNORMAL LOW (ref >59)

## 2021-07-11 LAB — MAGNESIUM: Magnesium: 2.5 mg/dL — ABNORMAL HIGH (ref 1.6–2.3)

## 2021-07-16 ENCOUNTER — Encounter: Payer: Self-pay | Admitting: Cardiology

## 2021-07-16 ENCOUNTER — Other Ambulatory Visit: Payer: Self-pay

## 2021-07-16 ENCOUNTER — Ambulatory Visit: Payer: PPO | Admitting: Cardiology

## 2021-07-16 VITALS — BP 118/64 | HR 73 | Ht 69.0 in | Wt 166.0 lb

## 2021-07-16 DIAGNOSIS — I420 Dilated cardiomyopathy: Secondary | ICD-10-CM

## 2021-07-16 DIAGNOSIS — I428 Other cardiomyopathies: Secondary | ICD-10-CM

## 2021-07-16 DIAGNOSIS — Z95810 Presence of automatic (implantable) cardiac defibrillator: Secondary | ICD-10-CM

## 2021-07-16 NOTE — Patient Instructions (Signed)
Medication Instructions:  Your physician recommends that you continue on your current medications as directed. Please refer to the Current Medication list given to you today.  *If you need a refill on your cardiac medications before your next appointment, please call your pharmacy*   Lab Work: None ordered.  If you have labs (blood work) drawn today and your tests are completely normal, you will receive your results only by: [place] (if you have MyChart) OR A paper copy in the mail If you have any lab test that is abnormal or we need to change your treatment, we will call you to review the results.   Testing/Procedures: None ordered.    Follow-Up: At [facility], you and your health needs are our priority.  As part of our continuing mission to provide you with exceptional heart care, we have created designated Provider Care Teams.  These Care Teams include your primary Cardiologist (physician) and Advanced Practice Providers (APPs -  Physician Assistants and Nurse Practitioners) who all work together to provide you with the care you need, when you need it.  We recommend signing up for the patient portal called "MyChart".  Sign up information is provided on this After Visit Summary.  MyChart is used to connect with patients for Virtual Visits (Telemedicine).  Patients are able to view lab/test results, encounter notes, upcoming appointments, etc.  Non-urgent messages can be sent to your provider as well.   To learn more about what you can do with MyChart, go to [email or web address].    Your next appointment:   9 month(s)  The format for your next appointment:   In Person  Provider:   Allegra Lai, MD

## 2021-07-16 NOTE — Progress Notes (Signed)
Electrophysiology Office Note   Date:  07/16/2021   ID:  Nicholas Wilkerson, DOB 04-22-1941, MRN [id number]  PCP:  Laurey Morale, MD  Cardiologist:  [name] Primary Electrophysiologist:  [name], MD    Chief Complaint: CHF   History of Present Illness: Nicholas Wilkerson is a 80 y.o. male who is being seen today for the evaluation of CHF at the request of Laurey Morale, MD. Presenting today for electrophysiology evaluation.  He has a history significant for dilated cardiomyopathy, hypertension, hyperlipidemia, squamous cell carcinoma at the base of his tongue status postchemotherapy and radiation.  November 2021 he developed palpitations.  Monitor showed bursts of SVT and nonsustained VT.  His Coreg was increased.  January 2022 he developed fatigue.  Echo showed an ejection fraction of 20 to 25%.  Left heart catheterization showed no obstructive coronary artery disease.  He was started on optimal medical therapy, though his ejection fraction remains low.  He is now status post [place] ICD implanted 04/02/2021.  Today, denies symptoms of palpitations, chest pain, shortness of breath, orthopnea, PND, lower extremity edema, claudication, dizziness, presyncope, syncope, bleeding, or neurologic sequela. The patient is tolerating medications without difficulties.  Overall he has been feeling well.  He has no chest pain or shortness of breath.  Is able do all of his daily activities.  He continues to work.  He has no complaints today.   Past Medical History:  Diagnosis Date   ARTHRITIS 10/22/2008   BPH (benign prostatic hyperplasia)    CHF (congestive heart failure) ([place])    sees Dr. Peter [name]    Dilated cardiomyopathy [facility])    DIVERTICULOSIS, COLON 12/08/2007   Glaucoma    sees Dr. Heather [place]    H/O asbestos exposure    History of echocardiogram 05/22/2007   EF was 45-50% / Mild concentric LV hypertrophy with mild global hypokinesis and overall mild systolic dysfunction .   Mild AV sclerosis / Mild Mitral insufficiency / compared to prior study 04/24/02 -- LV function has improved further.     History of kidney stones    Hypercholesterolemia    Hypertension    Insomnia 06/11/2015   Radiation 01/01/15-02/18/15   base of tongue and bilateral neck 70 Gy   Skin cancer    squamous cell, basal cell   Squamous cell carcinoma 11/20/14   base of tongue primary   Throat cancer ([place]) 10/2014   had chemo   Thyroid disease    Past Surgical History:  Procedure Laterality Date   CARDIAC CATHETERIZATION  10/17/2001   EF estimated at 30% / moderate LV enlargement  / 1. Minimal nonobstructive atherosclerotic coronary artery disease / 2. Severe LV dysfunction with global hypokinesia consistent with dilated nonischemic cardiomyopath / 3. Moderate pulmonary hypertension   COLONOSCOPY  06/23/2017   per Dr. Carlean Purl, clear, no repeats needed    ICD IMPLANT N/A 04/02/2021   Procedure: ICD IMPLANT;  Surgeon: [name], MD;  Location: [place] [facility];  Service: Cardiovascular;  Laterality: N/A;   LYMPH NODE BIOPSY     RIGHT/LEFT HEART CATH AND CORONARY ANGIOGRAPHY N/A 07/22/2020   Procedure: RIGHT/LEFT HEART CATH AND CORONARY ANGIOGRAPHY;  Surgeon: [name], [name], MD;  Location: [place] [facility];  Service: Cardiovascular;  Laterality: N/A;     Current Outpatient Medications  Medication Sig Dispense Refill   aspirin EC 81 MG tablet Take 81 mg by mouth daily.     atorvastatin (LIPITOR) 40 MG tablet  Take 1 tablet (40 mg total) by mouth daily. 90 tablet 3   B Complex-C (B-COMPLEX WITH VITAMIN C) tablet Take 1 tablet by mouth once a week.     carvedilol (COREG) 25 MG tablet TAKE 1 TABLET BY MOUTH TWICE DAILY WITH A MEAL. TAKE 25 MG 180 tablet 0   diclofenac (VOLTAREN) 75 MG EC tablet Take 1 tablet (75 mg total) by mouth 2 (two) times daily. 180 tablet 3   dorzolamide-timolol (COSOPT) 22.3-6.8 MG/ML ophthalmic solution Place 1 drop into both eyes 2 (two) times daily.      empagliflozin (JARDIANCE) 10 MG TABS tablet Take 1 tablet (10 mg total) by mouth daily before breakfast. 90 tablet 3   fluticasone (FLONASE) 50 MCG/ACT nasal spray Place 2 sprays into both nostrils daily. 16 g 11   furosemide (LASIX) 40 MG tablet Take 1 tablet (40 mg total) by mouth daily. 90 tablet 3   hydrocortisone (PROCTOZONE-HC) 2.5 % rectal cream Place 1 application rectally 2 (two) times daily. 28 g 5   latanoprost (XALATAN) 0.005 % ophthalmic solution Place 1 drop into both eyes at bedtime.      levothyroxine (SYNTHROID) 75 MCG tablet Take 1 tablet by mouth once daily 90 tablet 0   Melatonin 10 MG TABS Take 10 mg by mouth at bedtime.      sacubitril-valsartan (ENTRESTO) 49-51 MG Take 1 tablet by mouth 2 (two) times daily. 180 tablet 3   sodium fluoride (FLUORISHIELD) 1.1 % GEL dental gel PLACE 1 APPLICATION ONTO THE TEETH AT BEDTIME 112 mL 11   spironolactone (ALDACTONE) 25 MG tablet Take 1 tablet (25 mg total) by mouth daily. 90 tablet 3   tamsulosin (FLOMAX) 0.4 MG CAPS capsule Take 1 capsule by mouth once daily 90 capsule 0   No current facility-administered medications for this visit.    Allergies:   Patient has no known allergies.   Social History:  The patient  reports that he has never smoked. He has never used smokeless tobacco. He reports current alcohol use. He reports that he does not use drugs.   Family History:  The patient's family history includes Angina in his mother; Cancer in his father; Stroke in his father.   ROS:  Please see the history of present illness.   Otherwise, review of systems is positive for none.   All other systems are reviewed and negative.   PHYSICAL EXAM: VS:  BP 118/64   Pulse 73   Ht 5\' 9"  (1.753 m)   Wt 166 lb (75.3 kg)   SpO2 98%   BMI 24.51 kg/m  , BMI Body mass index is 24.51 kg/m. GEN: Well nourished, well developed, in no acute distress  HEENT: normal  Neck: no JVD, carotid bruits, or masses Cardiac: RRR; no murmurs, rubs, or  gallops,no edema  Respiratory:  clear to auscultation bilaterally, normal work of breathing GI: soft, nontender, nondistended, + BS MS: no deformity or atrophy  Skin: warm and dry Neuro:  Strength and sensation are intact Psych: euthymic mood, full affect  EKG:  EKG is ordered today. Personal review of the ekg ordered shows sinus rhythm, rate 73, PVC  Recent Labs: 10/15/2020: TSH 1.89 03/27/2021: Hemoglobin 16.1; Platelets 225 07/10/2021: BUN 20; Creatinine, Ser 1.27; Magnesium 2.5; Potassium 4.3; Sodium 142    Lipid Panel     Component Value Date/Time   CHOL 106 02/12/2021 0903   TRIG 57 02/12/2021 0903   TRIG 126 08/25/2006 0850   HDL 41 02/12/2021 0903  CHOLHDL 2.6 02/12/2021 0903   CHOLHDL 5 10/15/2019 1058   VLDL 20.4 10/15/2019 1058   LDLCALC 52 02/12/2021 0903     Wt Readings from Last 3 Encounters:  07/16/21 166 lb (75.3 kg)  07/10/21 165 lb 9.6 oz (75.1 kg)  04/27/21 154 lb (69.9 kg)      Other studies Reviewed: Additional studies/ records that were reviewed today include: TTE 01/05/21  Review of the above records today demonstrates:   1. Left ventricular ejection fraction, by estimation, is 20 to 25%. The  left ventricle has severely decreased function. The left ventricle  demonstrates global hypokinesis. The left ventricular internal cavity size  was mildly to moderately dilated. Left  ventricular diastolic parameters are consistent with Grade II diastolic  dysfunction (pseudonormalization). Elevated left atrial pressure.   2. Right ventricular systolic function is normal. The right ventricular  size is normal.   3. Left atrial size was mild to moderately dilated.   4. The mitral valve is normal in structure. Mild mitral valve  regurgitation.   5. The aortic valve is tricuspid. Aortic valve regurgitation is not  visualized. Mild aortic valve sclerosis is present, with no evidence of  aortic valve stenosis.   6. The inferior vena cava is normal in size  with greater than 50%  respiratory variability, suggesting right atrial pressure of 3 mmHg.   RHC/LHC 07/22/20 Prox LAD to Mid LAD lesion is 25% stenosed. Prox Cx to Dist Cx lesion is 25% stenosed. Prox RCA to Mid RCA lesion is 15% stenosed. LV end diastolic pressure is moderately elevated. Hemodynamic findings consistent with moderate pulmonary hypertension.   ASSESSMENT AND PLAN:  1.  Chronic systolic heart failure due to dilated cardiomyopathy: Currently on optimal medical therapy with Coreg, Entresto, Aldactone, Jardiance.  He is now status post [place] ICD implanted 04/02/2021.  Device functioning appropriately.  He has had a small burden of nonsustained VT.  Have reduced his monitor zone to 150.  2.  Hypertension: Currently well controlled  3.  PVCs/SVT/nonsustained VT: Continue Coreg  Current medicines are reviewed at length with the patient today.   The patient does not have concerns regarding his medicines.  The following changes were made today:  none  Labs/ tests ordered today include:  Orders Placed This Encounter  Procedures   EKG 12-Lead      Disposition:   FU with Nicholas Wilkerson 9 months  Signed, Lennie Dunnigan Meredith Leeds, MD  07/16/2021 2:56 PM     [place] [address] [place] [id number] [phone] (office) [phone] (fax)

## 2021-07-19 ENCOUNTER — Other Ambulatory Visit: Payer: Self-pay | Admitting: Cardiology

## 2021-07-21 ENCOUNTER — Ambulatory Visit: Payer: PPO | Admitting: Internal Medicine

## 2021-07-24 ENCOUNTER — Telehealth: Payer: Self-pay | Admitting: Pharmacist

## 2021-07-24 NOTE — Chronic Care Management (AMB) (Signed)
Chronic Care Management Pharmacy Assistant   Name: Nicholas Wilkerson  MRN: [id number] DOB: 11-20-1940  Reason for Encounter: Disease State Hypertension Assessment Call    Conditions to be addressed/monitored: HTN  Recent office visits:  None  Recent consult visits:  07/16/21 Nicholas Haw, MD (Cardiology) - Patient reported for Nonischemic cardiomyopathy and other concerns. No medication changes.  07/10/21 [name], [name], MD (Cardiology) - Patient presented for Nonsustained ventricular tachycardia and other concerns. Stopped Sodium Fluoride.  Hospital visits:  Medication Reconciliation was completed by comparing discharge summary, patient's EMR and Pharmacy list, and upon discussion with patient.  Patient presented to [facility] on 04/[facility]/21 for Cardiac device. Patient was there for 8 hours   New?Medications Started at [facility] Discharge:?? -started None   Medication Changes at Hospital Discharge: -Changed None   Medications Discontinued at Hospital Discharge: -Stopped None   Medications that remain the same after Hospital Discharge:??  -All other medications will remain the same.    Medications: Outpatient Encounter Medications as of 07/24/2021  Medication Sig   aspirin EC 81 MG tablet Take 81 mg by mouth daily.   atorvastatin (LIPITOR) 40 MG tablet Take 1 tablet (40 mg total) by mouth daily.   B Complex-C (B-COMPLEX WITH VITAMIN C) tablet Take 1 tablet by mouth once a week.   carvedilol (COREG) 25 MG tablet TAKE 1 TABLET BY MOUTH TWICE DAILY WITH A MEAL. TAKE 25 MG   diclofenac (VOLTAREN) 75 MG EC tablet Take 1 tablet (75 mg total) by mouth 2 (two) times daily.   dorzolamide-timolol (COSOPT) 22.3-6.8 MG/ML ophthalmic solution Place 1 drop into both eyes 2 (two) times daily.   empagliflozin (JARDIANCE) 10 MG TABS tablet Take 1 tablet (10 mg total) by mouth daily before breakfast.   fluticasone (FLONASE) 50 MCG/ACT nasal spray Place 2 sprays into  both nostrils daily.   furosemide (LASIX) 40 MG tablet Take 1 tablet by mouth once daily   hydrocortisone (PROCTOZONE-HC) 2.5 % rectal cream Place 1 application rectally 2 (two) times daily.   latanoprost (XALATAN) 0.005 % ophthalmic solution Place 1 drop into both eyes at bedtime.    levothyroxine (SYNTHROID) 75 MCG tablet Take 1 tablet by mouth once daily   Melatonin 10 MG TABS Take 10 mg by mouth at bedtime.    sacubitril-valsartan (ENTRESTO) 49-51 MG Take 1 tablet by mouth 2 (two) times daily.   sodium fluoride (FLUORISHIELD) 1.1 % GEL dental gel PLACE 1 APPLICATION ONTO THE TEETH AT BEDTIME   spironolactone (ALDACTONE) 25 MG tablet Take 1 tablet (25 mg total) by mouth daily.   tamsulosin (FLOMAX) 0.4 MG CAPS capsule Take 1 capsule by mouth once daily   No facility-administered encounter medications on file as of 07/24/2021.  Reviewed chart prior to disease state call. Spoke with patient regarding BP  Recent Office Vitals: BP Readings from Last 3 Encounters:  07/16/21 118/64  07/10/21 115/80  04/27/21 120/80   Pulse Readings from Last 3 Encounters:  07/16/21 73  07/10/21 (!) 49  04/27/21 64    Wt Readings from Last 3 Encounters:  07/16/21 166 lb (75.3 kg)  07/10/21 165 lb 9.6 oz (75.1 kg)  04/27/21 154 lb (69.9 kg)     Kidney Function Lab Results  Component Value Date/Time   CREATININE 1.27 07/10/2021 03:55 PM   CREATININE 1.72 (H) 03/27/2021 12:14 PM   CREATININE 1.35 (H) 11/19/2016 11:54 AM   CREATININE 1.3 06/21/2016 08:49 AM   CREATININE 1.3 12/12/2015 10:28 AM  GFR 59.42 (L) 10/15/2019 10:58 AM   GFRNONAA 54 (L) 10/07/2020 11:12 AM   GFRAA 63 10/07/2020 11:12 AM    BMP Latest Ref Rng & Units 07/10/2021 03/27/2021 02/12/2021  Glucose 70 - 99 mg/dL 108(H) 102(H) 97  BUN 8 - 27 mg/dL 20 35(H) 31(H)  Creatinine 0.76 - 1.27 mg/dL 1.27 1.72(H) 1.48(H)  BUN/Creat Ratio 10 - 24 16 20 21   Sodium 134 - 144 mmol/L 142 138 139  Potassium 3.5 - 5.2 mmol/L 4.3 4.6 5.2   Chloride 96 - 106 mmol/L 103 100 101  CO2 20 - 29 mmol/L 25 23 23   Calcium 8.6 - 10.2 mg/dL 9.6 9.3 9.7    Current antihypertensive regimen:  Carvedilol 25mg , 1 tablet twice daily  Entresto 49/51 mg, 1 tablet twice daily Spironolactone 25 mg 1 tablet daily How often are you checking your Blood Pressure? Patient reports that he is checking his pressures at home Current home BP readings: Patient reports his average Is 110/70 What recent interventions/DTPs have been made by any provider to improve Blood Pressure control since last CPP Visit: Patient reports no changes Any recent hospitalizations or ED visits since last visit with CPP? No What diet changes have been made to improve Blood Pressure Control?  Patient reports his meal patterns are good he reports he watches his weight and has maintained it give or take a pound. What exercise is being done to improve your Blood Pressure Control?  Patient reports he has a large yard that he maintains he has recently layed stone and cut down tree limbs.   Adherence Review: Is the patient currently on ACE/ARB medication? No Does the patient have >5 day gap between last estimated fill dates? No  Patient inquired about patient assistance applications advised he would like to proceed as his entresto and jardiance  is expensive, advised him he will be given the paperwork via mail or appointment during follow up with PCP and he was in agreement.  Care Gaps: Hepatitis Screening - Overdue Zoster vaccine overdue TDAP - Overdue COVID Booster #2 - Overdue Flu Vaccine - Overdue CCM - 12/22 AWV - MSG sent to Nicholas Wilkerson CMA to schedule. BP - 120/80   Star Rating Drugs: Atorvastatin (Lipitor)  40mg  - Last filled 03/12/2021 90DS at [facility] Empagliflozin (Jardiance) 10mg  - Last filled 05/04/2021 90DS at [facility] Call to [facility] verified the above dates as accurate  Nicholas Wilkerson Pharmacist Assistant [phone]

## 2021-07-25 ENCOUNTER — Other Ambulatory Visit (HOSPITAL_COMMUNITY): Payer: Self-pay

## 2021-07-25 MED FILL — Sodium Fluoride Gel 1.1% (0.5% F): DENTAL | 30 days supply | Qty: 112 | Fill #3 | Status: AC

## 2021-07-27 ENCOUNTER — Other Ambulatory Visit (HOSPITAL_COMMUNITY): Payer: Self-pay

## 2021-07-28 ENCOUNTER — Other Ambulatory Visit (HOSPITAL_COMMUNITY): Payer: Self-pay

## 2021-07-29 NOTE — Progress Notes (Addendum)
07/29/21 Per [name] pre filled out the PAP for Entresto to be mailed to patient with instructions he is aware per note. Patient to notify me upon receipt of paperwork and when he returns forms to cardiology so that I may follow up. [name] also reports that patient was denied for [place] and [name] last year so we cannot apply for those  07/30/21 Per [name] patient will pick up paperwork from office as he is needing to come and get samples for Jardiance, or get them at his appointment with her in Dec with her. Called patient and he is in agreement

## 2021-08-07 ENCOUNTER — Other Ambulatory Visit: Payer: Self-pay

## 2021-08-07 MED ORDER — EMPAGLIFLOZIN 10 MG PO TABS: 10.0000 mg | ORAL_TABLET | Freq: Every day | ORAL | 0 refills | Status: DC

## 2021-08-11 DIAGNOSIS — M5416 Radiculopathy, lumbar region: Secondary | ICD-10-CM | POA: Diagnosis not present

## 2021-08-12 DIAGNOSIS — N402 Nodular prostate without lower urinary tract symptoms: Secondary | ICD-10-CM | POA: Diagnosis not present

## 2021-08-13 ENCOUNTER — Other Ambulatory Visit: Payer: Self-pay

## 2021-08-13 MED ORDER — ENTRESTO 49-51 MG PO TABS: 1.0000 | ORAL_TABLET | Freq: Two times a day (BID) | ORAL | 3 refills | Status: DC

## 2021-08-14 ENCOUNTER — Telehealth: Payer: Self-pay | Admitting: Family Medicine

## 2021-08-14 NOTE — Telephone Encounter (Signed)
Patient called to get refill on methocarbamol (ROBAXIN) 500 MG tablet and predniSONE (DELTASONE) 10 MG tablet. Patient states he is having lower back pain and nothing is helping. Patient is scheduled for Monday     Please send to [facility] [place] Phone:  [phone]  Fax:  [phone]             Please advise

## 2021-08-14 NOTE — Telephone Encounter (Signed)
Dr. Barbie Banner patient. Both medications are currently not listed on med list.    Last office visit- 04/27/21.     Appointment with Dr. Sarajane Jews 08/17/21

## 2021-08-16 ENCOUNTER — Other Ambulatory Visit: Payer: Self-pay | Admitting: Cardiology

## 2021-08-17 ENCOUNTER — Ambulatory Visit (INDEPENDENT_AMBULATORY_CARE_PROVIDER_SITE_OTHER): Payer: PPO | Admitting: Family Medicine

## 2021-08-17 ENCOUNTER — Encounter: Payer: Self-pay | Admitting: Family Medicine

## 2021-08-17 ENCOUNTER — Other Ambulatory Visit: Payer: Self-pay

## 2021-08-17 VITALS — BP 100/60 | HR 76 | Resp 16 | Ht 69.0 in | Wt 161.0 lb

## 2021-08-17 DIAGNOSIS — M48061 Spinal stenosis, lumbar region without neurogenic claudication: Secondary | ICD-10-CM

## 2021-08-17 DIAGNOSIS — M5442 Lumbago with sciatica, left side: Secondary | ICD-10-CM

## 2021-08-17 MED ORDER — TIZANIDINE HCL 4 MG PO TABS: 2.0000 mg | ORAL_TABLET | Freq: Three times a day (TID) | ORAL | 0 refills | Status: AC | PRN

## 2021-08-17 MED ORDER — PREDNISONE 20 MG PO TABS: ORAL_TABLET | ORAL | 0 refills | Status: AC

## 2021-08-17 NOTE — Progress Notes (Signed)
ACUTE VISIT Chief Complaint  Patient presents with   Back Pain    X a while, currently has a tens unit on. He had a shot back in May, it helped. He went hiking with his wife and that's when the pain started back up.   HPI: Nicholas Wilkerson is a very very pleasant 80 y.o. male with hx of HTN,OA,throat cancer, CHF with ICD placement, and thyroid disease here today complaining of back pain as described above.  Back Pain This is a recurrent problem. The current episode started 1 to 4 weeks ago. The problem has been gradually worsening since onset. The pain is Worse during the day. The symptoms are aggravated by bending, standing and twisting. Stiffness is present In the morning. Pertinent negatives include no dysuria, headaches, pelvic pain, weakness or weight loss. Risk factors include history of cancer. He has tried nothing for the symptoms.   He has had back pain intermittently for years.  States that he was doing well until recently, started pain 3 weeks ago after [place] about 2 [place] the [place]. He did not have any problem while doing so but same night he noted mild left-sided back pain. Gradual onset, sharp, constant pain, 7-8/10. Radiated to posterior aspect of buttock and thigh. No hx of trauma.  No associated LE numbness,saddle anesthesia,or bowel/bladder dysfunction. Negative for fever,chills, abdominal pain,N/V,changes in bowel habits,urinary symptoms,or skin rash.  Pain is not affecting his sleep. He is still very active with chores,yard work,and construction/remodeling projects around his house.  For similar symptoms he took Prednisone and Methocarbamol, which helped.He tolerated treatment well.  He has not tried OTC analgesics. He is using a TENs unite.  Lumbar MRI 03/22/21: 1. Lumbar spine degeneration with generalized progression from 2005. 2. L4-5 compressive spinal stenosis. 3. L5-S1 right foraminal and subarticular recess impingement.  Review of  Systems  Constitutional:  Positive for activity change. Negative for appetite change, chills, fatigue and weight loss.  Respiratory:  Negative for cough, shortness of breath and wheezing.   Genitourinary:  Negative for decreased urine volume, dysuria, hematuria and pelvic pain.  Musculoskeletal:  Positive for back pain. Negative for gait problem.  Neurological:  Negative for syncope, weakness and headaches.  Rest see pertinent positives and negatives per HPI.  Current Outpatient Medications on File Prior to Visit  Medication Sig Dispense Refill   aspirin EC 81 MG tablet Take 81 mg by mouth daily.     atorvastatin (LIPITOR) 40 MG tablet Take 1 tablet (40 mg total) by mouth daily. 90 tablet 3   B Complex-C (B-COMPLEX WITH VITAMIN C) tablet Take 1 tablet by mouth once a week.     carvedilol (COREG) 25 MG tablet TAKE 1 TABLET BY MOUTH TWICE DAILY WITH A MEAL. TAKE 25 MG 180 tablet 0   diclofenac (VOLTAREN) 75 MG EC tablet Take 1 tablet (75 mg total) by mouth 2 (two) times daily. 180 tablet 3   dorzolamide-timolol (COSOPT) 22.3-6.8 MG/ML ophthalmic solution Place 1 drop into both eyes 2 (two) times daily.     empagliflozin (JARDIANCE) 10 MG TABS tablet Take 1 tablet (10 mg total) by mouth daily before breakfast. 90 tablet 3   empagliflozin (JARDIANCE) 10 MG TABS tablet Take 1 tablet (10 mg total) by mouth daily before breakfast. 28 tablet 0   fluticasone (FLONASE) 50 MCG/ACT nasal spray Place 2 sprays into both nostrils daily. 16 g 11   furosemide (LASIX) 40 MG tablet Take 1 tablet by mouth once daily  90 tablet 2   hydrocortisone (PROCTOZONE-HC) 2.5 % rectal cream Place 1 application rectally 2 (two) times daily. 28 g 5   latanoprost (XALATAN) 0.005 % ophthalmic solution Place 1 drop into both eyes at bedtime.      levothyroxine (SYNTHROID) 75 MCG tablet Take 1 tablet by mouth once daily 90 tablet 0   Melatonin 10 MG TABS Take 10 mg by mouth at bedtime.      sacubitril-valsartan (ENTRESTO) 49-51  MG Take 1 tablet by mouth 2 (two) times daily. 180 tablet 3   sodium fluoride (FLUORISHIELD) 1.1 % GEL dental gel PLACE 1 APPLICATION ONTO THE TEETH AT BEDTIME 112 mL 11   spironolactone (ALDACTONE) 25 MG tablet Take 1 tablet by mouth once daily 90 tablet 0   tamsulosin (FLOMAX) 0.4 MG CAPS capsule Take 1 capsule by mouth once daily 90 capsule 0   No current facility-administered medications on file prior to visit.   Past Medical History:  Diagnosis Date   ARTHRITIS 10/22/2008   BPH (benign prostatic hyperplasia)    CHF (congestive heart failure) ([place])    sees Dr. Peter [name]    Dilated cardiomyopathy [facility])    DIVERTICULOSIS, COLON 12/08/2007   Glaucoma    sees Dr. Heather [place]    H/O asbestos exposure    History of echocardiogram 05/22/2007   EF was 45-50% / Mild concentric LV hypertrophy with mild global hypokinesis and overall mild systolic dysfunction .  Mild AV sclerosis / Mild Mitral insufficiency / compared to prior study 04/24/02 -- LV function has improved further.     History of kidney stones    Hypercholesterolemia    Hypertension    Insomnia 06/11/2015   Radiation 01/01/15-02/18/15   base of tongue and bilateral neck 70 Gy   Skin cancer    squamous cell, basal cell   Squamous cell carcinoma 11/20/14   base of tongue primary   Throat cancer ([place]) 10/2014   had chemo   Thyroid disease    No Known Allergies  Social History   Socioeconomic History   Marital status: Married    Spouse name: Not on file   Number of children: 4   Years of education: Not on file   Highest education level: Not on file  Occupational History   Occupation: plant Lobbyist: RETIRED    Comment: retired  Tobacco Use   Smoking status: Never   Smokeless tobacco: Never  Vaping Use   Vaping Use: Never used  Substance and Sexual Activity   Alcohol use: Yes    Alcohol/week: 0.0 standard drinks    Comment: occasional beer, a couple times a month   Drug use: No   Sexual activity:  Not on file    Comment: retired Pension scheme manager, married  Other Topics Concern   Not on file  Social History Narrative   Married, retired Pension scheme manager 4 daughters, one of whom is a Marine scientist. Several grandchildren who he loves to play with.   1 coffee daily   02/21/2017   Social Determinants of Health   Financial Resource Strain: Medium Risk   Difficulty of Paying Living Expenses: Somewhat hard  Food Insecurity: Not on file  Transportation Needs: No Transportation Needs   Lack of Transportation (Medical): No   Lack of Transportation (Non-Medical): No  Physical Activity: Not on file  Stress: Not on file  Social Connections: Not on file   Vitals:   08/17/21 1425  BP: 100/60  Pulse: 76  Resp:  16  SpO2: 97%   Body mass index is 23.78 kg/m.  Physical Exam Vitals and nursing note reviewed.  Constitutional:      General: He is not in acute distress.    Appearance: He is well-developed.  HENT:     Head: Normocephalic and atraumatic.  Eyes:     Conjunctiva/sclera: Conjunctivae normal.  Cardiovascular:     Rate and Rhythm: Normal rate. Rhythm irregular.     Pulses:          Dorsalis pedis pulses are 2+ on the right side and 2+ on the left side.     Heart sounds: No murmur heard. Pulmonary:     Effort: Pulmonary effort is normal. No respiratory distress.     Breath sounds: Normal breath sounds.  Abdominal:     Palpations: Abdomen is soft. There is no hepatomegaly or mass.     Tenderness: There is no abdominal tenderness.  Musculoskeletal:     Lumbar back: No tenderness. Negative right straight leg raise test and negative left straight leg raise test.     Right lower leg: No edema.     Left lower leg: No edema.  Skin:    General: Skin is warm.     Findings: No erythema or rash.  Neurological:     Mental Status: He is alert and oriented to person, place, and time.     Cranial Nerves: No cranial nerve deficit.     Gait: Gait normal.     Comments: Stable gait, mildly  antalgic, not assisted.  Psychiatric:     Comments: Well groomed, good eye contact.   ASSESSMENT AND PLAN:  [email or web address] was seen today for back pain.  Diagnoses and all orders for this visit:  Left-sided low back pain with left-sided sciatica, unspecified chronicity -     predniSONE (DELTASONE) 20 MG tablet; 3 tabs for 2 days, 2 tabs for 3 days, 1 tabs for 3 days, and 1/2 tab for 3 days. Take tables together with breakfast. -     tiZANidine (ZANAFLEX) 4 MG tablet; Take 0.5-1 tablets (2-4 mg total) by mouth every 8 (eight) hours as needed for up to 21 days for muscle spasms.  Spinal stenosis of lumbar region, unspecified whether neurogenic claudication present  Chronic with acute exacerbation. We discussed some side effects of muscle relaxants and Prednisone. Prednisone taper and Zanaflex recommended. Recommend taking Prednisone with breakfast. After discussion of some side effects of Tramadol, he is not interested in taking it for pain; has not tolerated opioids well in the past. Tylenol 500 mg 3-4 times per day prn. If not better,he will need to see ortho and discussed other treatment options, states that he is not interested in surgery. Instructed about warning signs.  I spent a total of 32 minutes in both face to face and non face to face activities for this visit on the date of this encounter. During this time history was obtained and documented, examination was performed, prior labs/imaging reviewed, and assessment/plan discussed.  Return if symptoms worsen or fail to improve.  Berania Peedin G. [name], MD  [facility] [place] office.

## 2021-08-17 NOTE — Telephone Encounter (Signed)
Lvm on mobile number to discuss concerns at office appointment on 08/17/21.

## 2021-08-17 NOTE — Patient Instructions (Signed)
A few things to remember from today's visit:  Left-sided low back pain with left-sided sciatica, unspecified chronicity - Plan: predniSONE (DELTASONE) 20 MG tablet, tiZANidine (ZANAFLEX) 4 MG tablet  If you need refills please call your pharmacy. Do not use My Chart to request refills or for acute issues that need immediate attention.   Take Prednisone with breakfast because it can irritate your stomach. Tylenol 500 mg 3-4 times per day if needed. Zanaflex can cause drowsiness. Fall precautions. If not better ,you will need to follow with ortho.  Please be sure medication list is accurate. If a new problem present, please set up appointment sooner than planned today.

## 2021-08-20 DIAGNOSIS — R351 Nocturia: Secondary | ICD-10-CM | POA: Diagnosis not present

## 2021-08-20 DIAGNOSIS — R972 Elevated prostate specific antigen [PSA]: Secondary | ICD-10-CM | POA: Diagnosis not present

## 2021-08-20 DIAGNOSIS — N401 Enlarged prostate with lower urinary tract symptoms: Secondary | ICD-10-CM | POA: Diagnosis not present

## 2021-08-24 DIAGNOSIS — D225 Melanocytic nevi of trunk: Secondary | ICD-10-CM | POA: Diagnosis not present

## 2021-08-24 DIAGNOSIS — M5416 Radiculopathy, lumbar region: Secondary | ICD-10-CM | POA: Diagnosis not present

## 2021-08-24 DIAGNOSIS — L570 Actinic keratosis: Secondary | ICD-10-CM | POA: Diagnosis not present

## 2021-08-24 DIAGNOSIS — L720 Epidermal cyst: Secondary | ICD-10-CM | POA: Diagnosis not present

## 2021-08-24 DIAGNOSIS — L821 Other seborrheic keratosis: Secondary | ICD-10-CM | POA: Diagnosis not present

## 2021-08-24 DIAGNOSIS — D485 Neoplasm of uncertain behavior of skin: Secondary | ICD-10-CM | POA: Diagnosis not present

## 2021-08-24 DIAGNOSIS — Z85828 Personal history of other malignant neoplasm of skin: Secondary | ICD-10-CM | POA: Diagnosis not present

## 2021-09-01 ENCOUNTER — Encounter: Payer: Self-pay | Admitting: Cardiology

## 2021-09-07 DIAGNOSIS — H401131 Primary open-angle glaucoma, bilateral, mild stage: Secondary | ICD-10-CM | POA: Diagnosis not present

## 2021-09-09 ENCOUNTER — Telehealth: Payer: Self-pay

## 2021-09-09 ENCOUNTER — Other Ambulatory Visit: Payer: Self-pay

## 2021-09-09 MED ORDER — ENTRESTO 49-51 MG PO TABS: 1.0000 | ORAL_TABLET | Freq: Two times a day (BID) | ORAL | 3 refills | Status: DC

## 2021-09-09 NOTE — Telephone Encounter (Signed)
[employer] patient assistance form for Nicholas Wilkerson completed and faxed to fax # [phone]

## 2021-09-10 ENCOUNTER — Other Ambulatory Visit (HOSPITAL_COMMUNITY): Payer: Self-pay

## 2021-09-10 MED FILL — Sodium Fluoride Gel 1.1% (0.5% F): DENTAL | 30 days supply | Qty: 112 | Fill #4 | Status: AC

## 2021-09-11 ENCOUNTER — Telehealth: Payer: Self-pay | Admitting: Pharmacist

## 2021-09-11 NOTE — Chronic Care Management (AMB) (Signed)
    Chronic Care Management Pharmacy Assistant   Name: Nicholas Wilkerson  MRN: [id number] DOB: 1941-05-16  09/11/21 APPOINTMENT REMINDER  Patient  was reminded to have all medications, supplements and any blood glucose and blood pressure readings available for review with [name], Pharm. D, for office visit on 09/14/21 at 10.   Care Gaps: Hepatitis Screening - Overdue Zoster vaccine overdue TDAP - Overdue COVID Booster #2 - Overdue Flu Vaccine - Overdue CCM - 12/22 AWV - MSG sent to [name] CMA to schedule. BP - 100/60 (08/17/21)     Star Rating Drugs: Atorvastatin (Lipitor)  40mg  - Last filled 03/12/2021 90DS at [facility] Empagliflozin (Jardiance) 10mg  - Last filled 05/04/2021 90DS at [place] (Pt gets samples for this)   Any gaps in medications fill history?  Atorvastatin (Lipitor)  40mg  - Last filled 03/12/2021 90DS at [facility] Verified as Accurate   Medications: Outpatient Encounter Medications as of 09/11/2021  Medication Sig   aspirin EC 81 MG tablet Take 81 mg by mouth daily.   atorvastatin (LIPITOR) 40 MG tablet Take 1 tablet (40 mg total) by mouth daily.   B Complex-C (B-COMPLEX WITH VITAMIN C) tablet Take 1 tablet by mouth once a week.   carvedilol (COREG) 25 MG tablet TAKE 1 TABLET BY MOUTH TWICE DAILY WITH A MEAL. TAKE 25 MG   diclofenac (VOLTAREN) 75 MG EC tablet Take 1 tablet (75 mg total) by mouth 2 (two) times daily.   dorzolamide-timolol (COSOPT) 22.3-6.8 MG/ML ophthalmic solution Place 1 drop into both eyes 2 (two) times daily.   empagliflozin (JARDIANCE) 10 MG TABS tablet Take 1 tablet (10 mg total) by mouth daily before breakfast.   empagliflozin (JARDIANCE) 10 MG TABS tablet Take 1 tablet (10 mg total) by mouth daily before breakfast.   fluticasone (FLONASE) 50 MCG/ACT nasal spray Place 2 sprays into both nostrils daily.   furosemide (LASIX) 40 MG tablet Take 1 tablet by mouth once daily   hydrocortisone (PROCTOZONE-HC) 2.5 % rectal cream Place 1  application rectally 2 (two) times daily.   latanoprost (XALATAN) 0.005 % ophthalmic solution Place 1 drop into both eyes at bedtime.    levothyroxine (SYNTHROID) 75 MCG tablet Take 1 tablet by mouth once daily   Melatonin 10 MG TABS Take 10 mg by mouth at bedtime.    sacubitril-valsartan (ENTRESTO) 49-51 MG Take 1 tablet by mouth 2 (two) times daily.   sodium fluoride (FLUORISHIELD) 1.1 % GEL dental gel PLACE 1 APPLICATION ONTO THE TEETH AT BEDTIME   spironolactone (ALDACTONE) 25 MG tablet Take 1 tablet by mouth once daily   tamsulosin (FLOMAX) 0.4 MG CAPS capsule Take 1 capsule by mouth once daily   No facility-administered encounter medications on file as of 09/11/2021.     Patient Assistance: Entresto App Complete - Pt to pick up in office on 09/14/21 Wilder Glade- Denied will not reapply  [place] Pharmacist Assistant [phone]

## 2021-09-14 ENCOUNTER — Encounter: Payer: Self-pay | Admitting: Family Medicine

## 2021-09-14 ENCOUNTER — Ambulatory Visit (INDEPENDENT_AMBULATORY_CARE_PROVIDER_SITE_OTHER): Payer: PPO | Admitting: Family Medicine

## 2021-09-14 ENCOUNTER — Ambulatory Visit (INDEPENDENT_AMBULATORY_CARE_PROVIDER_SITE_OTHER): Payer: PPO | Admitting: Pharmacist

## 2021-09-14 VITALS — BP 110/60 | Temp 98.1°F | Wt 162.0 lb

## 2021-09-14 DIAGNOSIS — M5442 Lumbago with sciatica, left side: Secondary | ICD-10-CM

## 2021-09-14 DIAGNOSIS — Z23 Encounter for immunization: Secondary | ICD-10-CM | POA: Diagnosis not present

## 2021-09-14 DIAGNOSIS — R220 Localized swelling, mass and lump, head: Secondary | ICD-10-CM

## 2021-09-14 DIAGNOSIS — I5022 Chronic systolic (congestive) heart failure: Secondary | ICD-10-CM

## 2021-09-14 DIAGNOSIS — G8929 Other chronic pain: Secondary | ICD-10-CM | POA: Diagnosis not present

## 2021-09-14 DIAGNOSIS — I10 Essential (primary) hypertension: Secondary | ICD-10-CM

## 2021-09-14 NOTE — Addendum Note (Signed)
Addended by: Wyvonne Lenz on: 09/14/2021 11:49 AM   Modules accepted: Orders

## 2021-09-14 NOTE — Progress Notes (Signed)
Chronic Care Management Pharmacy Note  09/14/2021 Name:  Nicholas Wilkerson MRN:  [id number] DOB:  01/25/1941  Summary: BP at goal < 130/80 LDL at goal < 70   Recommendations/Changes made from today's visit: -Recommend continued routine BP monitoring at home -Will follow up on Entresto PAP   Plan: Follow up BP assessment in 3 months  Subjective: Nicholas Wilkerson is an 80 y.o. year old male who is a primary patient of Laurey Morale, MD.  The CCM team was consulted for assistance with disease management and care coordination needs.    Engaged with patient face to face for follow up visit in response to provider referral for pharmacy case management and/or care coordination services.   Consent to Services:  The patient was given information about Chronic Care Management services, agreed to services, and gave verbal consent prior to initiation of services.  Please see initial visit note for detailed documentation.   Patient Care Team: Laurey Morale, MD as PCP - General (Family Medicine) [name], MD as PCP - Electrophysiology (Cardiology) Heath Lark, MD as Consulting Physician (Hematology and Oncology) Melissa Montane, MD as Consulting Physician (Otolaryngology) Eppie Gibson, MD as Attending Physician (Radiation Oncology) Holley Bouche, NP (Inactive) as Nurse Practitioner (Nurse Practitioner) [name], [name], MD as Consulting Physician (Cardiology) [place], Heather, [place] (Optometry) Viona Gilmore, [facility] as Pharmacist (Pharmacist)  Recent office visits: 08/17/21 Betty [name], MD: Patient presented for acute visit for back pain. Prescribed prednisone taper and tizanidine.  04/27/21 Alysia Penna, MD: Patient presented for elevated glucose in urine. A1c of 5.9%.  03/16/21 Alysia Penna, MD: Patient presented for chronic low back pain. Plan for lumbar MRI.  Recent consult visits: 07/16/21 [name], MD (Cardiology) - Patient reported for Nonischemic cardiomyopathy  and other concerns. No medication changes.   07/10/21 [name], [name], MD (Cardiology) - Patient presented for Nonsustained ventricular tachycardia and other concerns. Stopped Sodium Fluoride.  04/07/21 Carl Best, NP: Patient presented for rectal bleeding and constipation. Continue miralax nightly. Recommended Pepcid 20 mg OTC while taking prednisone.  03/30/21 Hessie Diener, PTA (outpatient rehab): Patient presented for PT session.  Hospital visits: Medication Reconciliation was completed by comparing discharge summary, patient's EMR and Pharmacy list, and upon discussion with patient.   Patient presented to [facility] on 04/02/21 for Cardiac device. Patient was there for 8 hours    New?Medications Started at [facility] Discharge:?? -started None    Medication Changes at Hospital Discharge: -Changed None    Medications Discontinued at Hospital Discharge: -Stopped None    Medications that remain the same after Hospital Discharge:??  -All other medications will remain the same.   Objective:  Lab Results  Component Value Date   CREATININE 1.27 07/10/2021   BUN 20 07/10/2021   GFR 59.42 (L) 10/15/2019   GFRNONAA 54 (L) 10/07/2020   GFRAA 63 10/07/2020   NA 142 07/10/2021   K 4.3 07/10/2021   CALCIUM 9.6 07/10/2021   CO2 25 07/10/2021   GLUCOSE 108 (H) 07/10/2021    Lab Results  Component Value Date/Time   HGBA1C 5.9 (A) 04/27/2021 11:00 AM   HGBA1C 6.3 10/15/2020 11:51 AM   HGBA1C 5.5 05/27/2020 11:38 AM   GFR 59.42 (L) 10/15/2019 10:58 AM   GFR 70.83 09/29/2018 09:53 AM    Last diabetic Eye exam: No results found for: HMDIABEYEEXA  Last diabetic Foot exam: No results found for: HMDIABFOOTEX   Lab Results  Component Value Date  CHOL 106 02/12/2021   HDL 41 02/12/2021   LDLCALC 52 02/12/2021   TRIG 57 02/12/2021   CHOLHDL 2.6 02/12/2021    Hepatic Function Latest Ref Rng & Units 07/15/2020 10/15/2019 09/29/2018  Total Protein 6.0 - 8.5  g/dL 6.7 6.3 6.5  Albumin 3.7 - 4.7 g/dL 4.7 4.3 4.4  AST 0 - 40 IU/L 20 16 17   ALT 0 - 44 IU/L 21 16 19   Alk Phosphatase 44 - 121 IU/L 85 69 63  Total Bilirubin 0.0 - 1.2 mg/dL 0.5 0.6 0.6  Bilirubin, Direct 0.0 - 0.3 mg/dL - 0.1 0.1    Lab Results  Component Value Date/Time   TSH 1.89 10/15/2020 11:51 AM   TSH 1.45 05/27/2020 11:38 AM   FREET4 1.02 10/15/2020 11:51 AM   FREET4 1.4 05/27/2020 11:38 AM    CBC Latest Ref Rng & Units 03/27/2021 02/12/2021 07/22/2020  WBC 3.4 - 10.8 x10E3/uL 10.7 7.9 -  Hemoglobin 13.0 - 17.7 g/dL 16.1 15.6 18.0(H)  Hematocrit 37.5 - 51.0 % 48.1 46.5 53.0(H)  Platelets 150 - 450 x10E3/uL 225 209 -    No results found for: VD25OH  Clinical ASCVD: Yes  The ASCVD Risk score (Arnett DK, et al., 2019) failed to calculate for the following reasons:   The valid total cholesterol range is 130 to 320 mg/dL    Depression screen [facility] 2/9 09/14/2021 10/15/2020 05/26/2020  Decreased Interest 0 0 0  Down, Depressed, Hopeless 0 0 0  PHQ - 2 Score 0 0 0  Altered sleeping 0 - 0  Tired, decreased energy 0 - 0  Change in appetite 0 - 0  Feeling bad or failure about yourself  0 - 0  Trouble concentrating 0 - 0  Moving slowly or fidgety/restless 0 - 0  Suicidal thoughts 0 - 0  PHQ-9 Score 0 - 0  Difficult doing work/chores Not difficult at all - Not difficult at all  Some recent data might be hidden      Social History   Tobacco Use  Smoking Status Never  Smokeless Tobacco Never   BP Readings from Last 3 Encounters:  09/14/21 110/60  08/17/21 100/60  07/16/21 118/64   Pulse Readings from Last 3 Encounters:  08/17/21 76  07/16/21 73  07/10/21 (!) 49   Wt Readings from Last 3 Encounters:  09/14/21 162 lb (73.5 kg)  08/17/21 161 lb (73 kg)  07/16/21 166 lb (75.3 kg)   BMI Readings from Last 3 Encounters:  09/14/21 23.92 kg/m  08/17/21 23.78 kg/m  07/16/21 24.51 kg/m    Assessment/Interventions: Review of patient past medical history,  allergies, medications, health status, including review of consultants reports, laboratory and other test data, was performed as part of comprehensive evaluation and provision of chronic care management services.   SDOH:  (Social Determinants of Health) assessments and interventions performed: No  SDOH Screenings   Alcohol Screen: Not on file  Depression (PHQ2-9): Low Risk    PHQ-2 Score: 0  Financial Resource Strain: Medium Risk   Difficulty of Paying Living Expenses: Somewhat hard  Food Insecurity: Not on file  Housing: Not on file  Physical Activity: Not on file  Social Connections: Not on file  Stress: Not on file  Tobacco Use: Low Risk    Smoking Tobacco Use: Never   Smokeless Tobacco Use: Never   Passive Exposure: Not on file  Transportation Needs: No Transportation Needs   Lack of Transportation (Medical): No   Lack of Transportation (Non-Medical): No  CCM Care Plan  No Known Allergies  Medications Reviewed Today     Reviewed by Laurey Morale, MD (Physician) on 09/14/21 at [phone]  Med List Status: <None>   Medication Order Taking? Sig Documenting Provider Last Dose Status Informant  aspirin EC 81 MG tablet [id number] No Take 81 mg by mouth daily. [provider] Taking Active Multiple Informants  atorvastatin (LIPITOR) 40 MG tablet [id number] No Take 1 tablet (40 mg total) by mouth daily. Laurey Morale, MD Taking Active Multiple Informants  B Complex-C (B-COMPLEX WITH VITAMIN C) tablet [id number] No Take 1 tablet by mouth once a week. [provider] Taking Active   carvedilol (COREG) 25 MG tablet [id number] No TAKE 1 TABLET BY MOUTH TWICE DAILY WITH A MEAL. TAKE 25 MG [name], [name], MD Taking Active   diclofenac (VOLTAREN) 75 MG EC tablet [id number] No Take 1 tablet (75 mg total) by mouth 2 (two) times daily. Laurey Morale, MD Taking Active   dorzolamide-timolol (COSOPT) 22.3-6.8 MG/ML ophthalmic solution [id number] No Place 1 drop into both eyes 2  (two) times daily. [provider] Taking Active Multiple Informants  empagliflozin (JARDIANCE) 10 MG TABS tablet [id number] No Take 1 tablet (10 mg total) by mouth daily before breakfast. [name], [name], MD Taking Active Multiple Informants  empagliflozin (JARDIANCE) 10 MG TABS tablet [id number] No Take 1 tablet (10 mg total) by mouth daily before breakfast. Laurey Morale, MD Taking Active   fluticasone (FLONASE) 50 MCG/ACT nasal spray [id number] No Place 2 sprays into both nostrils daily. Laurey Morale, MD Taking Active   furosemide (LASIX) 40 MG tablet [id number] No Take 1 tablet by mouth once daily [name], [name], MD Taking Active   hydrocortisone (PROCTOZONE-HC) 2.5 % rectal cream [id number] No Place 1 application rectally 2 (two) times daily. Laurey Morale, MD Taking Active   latanoprost (XALATAN) 0.005 % ophthalmic solution [id number] No Place 1 drop into both eyes at bedtime.  [provider] Taking Active Multiple Informants  levothyroxine (SYNTHROID) 75 MCG tablet [id number] No Take 1 tablet by mouth once daily Laurey Morale, MD Taking Active   Melatonin 10 MG TABS [id number] No Take 10 mg by mouth at bedtime.  [provider] Taking Active Multiple Informants  sacubitril-valsartan (ENTRESTO) 49-51 MG [id number]  Take 1 tablet by mouth 2 (two) times daily. [name], [name], MD  Active   sodium fluoride (FLUORISHIELD) 1.1 % GEL dental gel [id number] No PLACE 1 APPLICATION ONTO THE TEETH AT BEDTIME Sandi Mariscal B, DMD Taking Active   spironolactone (ALDACTONE) 25 MG tablet [id number] No Take 1 tablet by mouth once daily [name], [name], MD Taking Active   tamsulosin [facility]) 0.4 MG CAPS capsule [id number] No Take 1 capsule by mouth once daily Laurey Morale, MD Taking Active             Patient Active Problem List   Diagnosis Date Noted   Chronic left-sided low back pain with left-sided sciatica 09/14/2021   ICD (implantable cardioverter-defibrillator) in place  07/16/2021   Constipation 04/08/2021   COVID-19 virus infection 03/16/2021   Elevated PSA 02/28/2020   Hypothyroidism 12/13/2019   Nonischemic cardiomyopathy ([place]) 09/28/2019   Personal history of malignant neoplasm of tongue 12/15/2015   Insomnia 06/11/2015   Skin infection at gastrostomy tube site ([place]) 06/09/2015   Depression, acute 03/24/2015   Palpitations 01/24/2015   History of skin cancer 12/03/2014   Cancer of base of tongue ([place]) 12/02/2014   Acute  on chronic systolic CHF (congestive heart failure) ([place]) 07/12/2013   MUSCLE STRAIN, ABDOMINAL WALL 11/28/2008   ARTHRITIS 10/22/2008   BURSITIS, LEFT SHOULDER 07/24/2008   Internal hemorrhoids 12/08/2007   EXTERNAL HEMORRHOIDS 12/08/2007   DIVERTICULOSIS, COLON 12/08/2007   RECTAL BLEEDING 12/08/2007   History of cardiovascular disorder 12/08/2007   Congestive dilated cardiomyopathy ([place]) 06/13/2007   HX, PERSONAL, ARTHRITIS 06/13/2007   Hypercholesterolemia 05/15/2007   Essential hypertension 05/15/2007    Immunization History  Administered Date(s) Administered   Fluad Quad(high Dose 65+) 10/15/2019, 10/15/2020, 09/14/2021   Influenza Split 08/11/2011, 08/31/2012   Influenza Whole 08/18/2007, 07/11/2008, 08/12/2009, 06/25/2010   Influenza, High Dose Seasonal PF 07/31/2013, 07/01/2016, 07/18/2017, 08/09/2018   Influenza,inj,Quad PF,6+ Mos 07/31/2014, 06/13/2015   Janssen (J&J) SARS-COV-2 Vaccination 02/09/2020   Pneumococcal Conjugate-13 09/23/2016   Pneumococcal Polysaccharide-23 06/19/2009   Td 06/27/2008   Zoster Recombinat (Shingrix) 02/21/2018   Zoster, Live 07/11/2008   Patient reports he is considering switching insurances but the cost of Jardiance is a lot lower for with HTA so he doesn't think he will switch.   He reports he is feeling well overall and his energy level is good. He also reports no dizziness since starting the [facility].   He also stopped taking the atorvastatin on his own for a little while  and then slowly tapered back up. He took 1/2 tablet for a while and is now taking 1 full tablet. He felt as though it was making him feeling more sluggish but denies any issues right now.  Conditions to be addressed/monitored:  Hypertension, Hyperlipidemia, Heart Failure, Hypothyroidism, Osteoarthritis and Glaucoma, Insomnia  Conditions addressed this visit: Hypertension, heart failure, hyperlipidemia  Care Plan : CCM Pharmacy Care Plan  Updates made by Viona Gilmore, [place] since 09/14/2021 12:00 AM     Problem: Problem: Hypertension, Hyperlipidemia, Heart Failure, Hypothyroidism, Osteoarthritis and Glaucoma, Insomnia      Long-Range Goal: Patient-Specific Goal   Start Date: 01/28/2021  Expected End Date: 01/28/2022  Recent Progress: On track  Priority: High  Note:   Current Barriers:  Unable to independently afford treatment regimen Unable to independently monitor therapeutic efficacy  Pharmacist Clinical Goal(s):  Patient will achieve adherence to monitoring guidelines and medication adherence to achieve therapeutic efficacy maintain control of blood pressure as evidenced by home blood pressure readings  through collaboration with PharmD and provider.   Interventions: 1:1 collaboration with Laurey Morale, MD regarding development and update of comprehensive plan of care as evidenced by provider attestation and co-signature Inter-disciplinary care team collaboration (see longitudinal plan of care) Comprehensive medication review performed; medication list updated in electronic medical record  Hypertension (BP goal <130/80) -Controlled -Current treatment: Carvedilol 36m, 1 tablet twice daily  Entresto 49/51 mg, 1 tablet twice daily Spironolactone 25 mg 1 tablet daily -Medications previously tried: ramipril (low BP)    -Current home readings: 110-120/60-70s (2-3 times a week) -Current dietary habits: did not discuss -Current exercise habits: patient plays golf and remains  active throughout the day -Denies hypotensive/hypertensive symptoms -Educated on Exercise goal of 150 minutes per week; Importance of home blood pressure monitoring; Proper BP monitoring technique; Symptoms of hypotension and importance of maintaining adequate hydration; -Counseled to monitor BP at home weekly, document, and provide log at future appointments -Counseled on diet and exercise extensively Recommended to continue current medication  Hyperlipidemia: (LDL goal < 70) -Controlled -Current treatment: Atorvastatin 40 mg 1 tablet daily -Medications previously tried: simvastatin (unknown)  -Current dietary patterns: Patient notes avoids fried food  and aim to eat baked fish. Avoids junk food. -Current exercise habits: active throughout the day -Educated on Cholesterol goals;  Benefits of statin for ASCVD risk reduction; Importance of limiting foods high in cholesterol; Exercise goal of 150 minutes per week; -Counseled on diet and exercise extensively Recommended to continue current medication  CAD (Goal: prevent heart attacks and strokes) -Controlled -Current treatment  Atorvastatin 40 mg 1 tablet daily Aspirin 55m, 1 tablet daily -Medications previously tried: none  -Recommended to continue current medication  Pre-diabetes (A1c goal <6.5%) -Controlled -Current medications: Jardiance 10 mg 1 tablet daily -Medications previously tried: none  -Current home glucose readings fasting glucose: does not check post prandial glucose: does not check -Denies hypoglycemic/hyperglycemic symptoms -Current meal patterns:  breakfast: did not discuss lunch: did not discuss  dinner: did not discuss snacks: did not discuss drinks: did not discuss -Current exercise: active throughout the day -Educated on A1c and blood sugar goals; Exercise goal of 150 minutes per week; Carbohydrate counting and/or plate method -Counseled to check feet daily and get yearly eye exams -Counseled on  diet and exercise extensively Patient reported he ate "a lot of stuff he shouldn't" before this was checked and has improve since then  Heart Failure (Goal: manage symptoms and prevent exacerbations) -Controlled -Last ejection fraction: 20-25% (Date: 07/2020) -HF type: Systolic -NYHA Class: II (slight limitation of activity) -AHA HF Stage: C (Heart disease and symptoms present) -Current treatment: Carvedilol 21m 1 tablet twice daily  Entresto 49/51 mg, 1 tablet twice daily Spironolactone 25 mg 1 tablet daily Jardiance 10 mg 1 tablet daily Furosemide 40 mg 1 tablet daily -Medications previously tried: ramipril (low BP) -Current home BP/HR readings: lower normal range -Current dietary habits: limiting salt intake -Current exercise habits: stays active -Educated on Benefits of medications for managing symptoms and prolonging life Importance of weighing daily; if you gain more than 3 pounds in one day or 5 pounds in one week, call cardiologist -Counseled on diet and exercise extensively Recommended to continue current medication Assessed patient finances. Provided 2 weeks worth of Jardiance samples to hold him over until the end of the year.  Hypothyroidism (Goal: TSH 0.35-4.5) -Controlled -Current treatment  levothyroxine 7572m 1 tablet once daily -Medications previously tried: none  -Recommended to continue current medication  Insomnia (Goal: improve quality and quantity of sleep) -Controlled -Current treatment  melatonin 53m64m tablet at night -Medications previously tried: none  -Recommended to continue current medication  BPH (Goal: minimize symptoms) -Controlled -Current treatment  Tamsulosin 0.4mg,77mcapsule daily -Medications previously tried: none  -Recommended to continue current medication  Glaucoma (Goal: lower intraocular pressure) -Controlled -Current treatment  Brimodine use as directed Latanoprost 1 drop at bedtime -Medications previously tried: none   -Recommended to continue current medication  Health Maintenance -Vaccine gaps: tetanus, COVID booster, second dose of shingrix -Current therapy:  Vitamin B complex daily -Educated on Cost vs benefit of each product must be carefully weighed by individual consumer -Patient is satisfied with current therapy and denies issues -Recommended to continue current medication  Patient Goals/Self-Care Activities Patient will:  - take medications as prescribed check blood pressure weekly, document, and provide at future appointments weigh daily, and contact provider if weight gain of > 3 lbs in one day or > 5 lbs in one week  Follow Up Plan: Telephone follow up appointment with care management team member scheduled for: 6 months       Medication Assistance:  EntreDelene Lollined through [employer] assistance program.  Enrollment ends 10/10/21  Compliance/Adherence/Medication fill history: Care Gaps: Shingrix, tetanus, COVID booster, influenza vaccine, Hep C screening BP - 100/60 (08/17/21)   Star-Rating Drugs: Atorvastatin (Lipitor)  40m - Last filled 03/12/2021 90DS at [facility] Empagliflozin (Jardiance) 179m- [facility]  [place] [facility] [place]:  [facility] [place] [address] [place] ([place] [id number]: [phone]: [phone]one: [phone]: [phone] [place]one: [phone]: [phone] pill box? Yes Pt endorses 100% compliance  We discussed: Current pharmacy is preferred with insurance plan and patient is satisfied with pharmacy services Patient decided to: Continue current medication management strategy  Care Plan and Follow Up Patient  Decision:  Patient agrees to Care Plan and Follow-up.  Plan: Telephone follow up appointment with care management team member scheduled for:  6 months  MaJeni SallesPharmD BCStocktonharmacist [place] Br[phone]

## 2021-09-14 NOTE — Patient Instructions (Signed)
Hi [name],  It was great to see you again! I hope you have a very Merry Christmas!!  Please reach out to me if you have any questions or need anything before our follow up!  Best, Maddie  [name], PharmD, [facility]   Visit Information   Goals Addressed   None    Patient Care Plan: CCM Pharmacy Care Plan     Problem Identified: Problem: Hypertension, Hyperlipidemia, Heart Failure, Hypothyroidism, Osteoarthritis and Glaucoma, Insomnia      Long-Range Goal: Patient-Specific Goal   Start Date: 01/28/2021  Expected End Date: 01/28/2022  Recent Progress: On track  Priority: High  Note:   Current Barriers:  Unable to independently afford treatment regimen Unable to independently monitor therapeutic efficacy  Pharmacist Clinical Goal(s):  Patient will achieve adherence to monitoring guidelines and medication adherence to achieve therapeutic efficacy maintain control of blood pressure as evidenced by home blood pressure readings  through collaboration with PharmD and provider.   Interventions: 1:1 collaboration with Laurey Morale, MD regarding development and update of comprehensive plan of care as evidenced by provider attestation and co-signature Inter-disciplinary care team collaboration (see longitudinal plan of care) Comprehensive medication review performed; medication list updated in electronic medical record  Hypertension (BP goal <130/80) -Controlled -Current treatment: Carvedilol 25mg , 1 tablet twice daily  Entresto 49/51 mg, 1 tablet twice daily Spironolactone 25 mg 1 tablet daily -Medications previously tried: ramipril (low BP)    -Current home readings: 110-120/60-70s (2-3 times a week) -Current dietary habits: did not discuss -Current exercise habits: patient plays golf and remains active throughout the day -Denies hypotensive/hypertensive symptoms -Educated on Exercise goal of 150 minutes  per week; Importance of home blood pressure monitoring; Proper BP monitoring technique; Symptoms of hypotension and importance of maintaining adequate hydration; -Counseled to monitor BP at home weekly, document, and provide log at future appointments -Counseled on diet and exercise extensively Recommended to continue current medication  Hyperlipidemia: (LDL goal < 70) -Controlled -Current treatment: Atorvastatin 40 mg 1 tablet daily -Medications previously tried: simvastatin (unknown)  -Current dietary patterns: Patient notes avoids fried food and aim to eat baked fish. Avoids junk food. -Current exercise habits: active throughout the day -Educated on Cholesterol goals;  Benefits of statin for ASCVD risk reduction; Importance of limiting foods high in cholesterol; Exercise goal of 150 minutes per week; -Counseled on diet and exercise extensively Recommended to continue current medication  CAD (Goal: prevent heart attacks and strokes) -Controlled -Current treatment  Atorvastatin 40 mg 1 tablet daily Aspirin 81mg , 1 tablet daily -Medications previously tried: none  -Recommended to continue current medication  Pre-diabetes (A1c goal <6.5%) -Controlled -Current medications: Jardiance 10 mg 1 tablet daily -Medications previously tried: none  -Current home glucose readings fasting glucose: does not check post prandial glucose: does not check -Denies hypoglycemic/hyperglycemic symptoms -Current meal patterns:  breakfast: did not discuss lunch: did not discuss  dinner: did not discuss snacks: did not discuss drinks: did not discuss -Current exercise: active throughout the day -Educated on A1c and blood sugar goals; Exercise goal of 150 minutes per week; Carbohydrate counting and/or plate method -Counseled to check feet daily and get yearly eye exams -Counseled on diet and exercise extensively Patient reported he ate "a lot of stuff he shouldn't" before this was checked and  has improve since then  Heart Failure (Goal: manage symptoms and prevent exacerbations) -Controlled -Last ejection fraction: 20-25% (Date: 07/2020) -HF type: Systolic -NYHA Class: II (slight limitation  of activity) -AHA HF Stage: C (Heart disease and symptoms present) -Current treatment: Carvedilol 25mg , 1 tablet twice daily  Entresto 49/51 mg, 1 tablet twice daily Spironolactone 25 mg 1 tablet daily Jardiance 10 mg 1 tablet daily Furosemide 40 mg 1 tablet daily -Medications previously tried: ramipril (low BP) -Current home BP/HR readings: lower normal range -Current dietary habits: limiting salt intake -Current exercise habits: stays active -Educated on Benefits of medications for managing symptoms and prolonging life Importance of weighing daily; if you gain more than 3 pounds in one day or 5 pounds in one week, call cardiologist -Counseled on diet and exercise extensively Recommended to continue current medication Assessed patient finances. Provided 2 weeks worth of Jardiance samples to hold him over until the end of the year.  Hypothyroidism (Goal: TSH 0.35-4.5) -Controlled -Current treatment  levothyroxine 13mcg, 1 tablet once daily -Medications previously tried: none  -Recommended to continue current medication  Insomnia (Goal: improve quality and quantity of sleep) -Controlled -Current treatment  melatonin 10mg , 1 tablet at night -Medications previously tried: none  -Recommended to continue current medication  BPH (Goal: minimize symptoms) -Controlled -Current treatment  Tamsulosin 0.4mg , 1 capsule daily -Medications previously tried: none  -Recommended to continue current medication  Glaucoma (Goal: lower intraocular pressure) -Controlled -Current treatment  Brimodine use as directed Latanoprost 1 drop at bedtime -Medications previously tried: none  -Recommended to continue current medication  Health Maintenance -Vaccine gaps: tetanus, COVID booster,  second dose of shingrix -Current therapy:  Vitamin B complex daily -Educated on Cost vs benefit of each product must be carefully weighed by individual consumer -Patient is satisfied with current therapy and denies issues -Recommended to continue current medication  Patient Goals/Self-Care Activities Patient will:  - take medications as prescribed check blood pressure weekly, document, and provide at future appointments weigh daily, and contact provider if weight gain of > 3 lbs in one day or > 5 lbs in one week  Follow Up Plan: Telephone follow up appointment with care management team member scheduled for: 6 months       Patient verbalizes understanding of instructions provided today and agrees to view in [place].  Telephone follow up appointment with pharmacy team member scheduled for: 6 months  Viona Gilmore, [facility]

## 2021-09-14 NOTE — Progress Notes (Signed)
   Subjective:    Patient ID: Nicholas Wilkerson, male    DOB: 1941/10/05, 80 y.o.   MRN: [id number]  HPI Here to follow up on low back pain and for transient facial swelling. Last month he had another flare of low back pain and he saw Dr. [name], who prescribed Prednisone and Tizanidine. This helped a little, but then he saw Dr. Kathyrn Sheriff at [facility] on 08-24-21. He set Mikki Santee up for PT, and this has been helping a great deal. The pain is almost gone. Several weeks ago Bon experienced some puffiness in the face, and he felt this was due to the Prednisone. He stopped this, and the swelling promptly resolved.   Review of Systems  Constitutional: Negative.   Respiratory: Negative.    Cardiovascular: Negative.   Musculoskeletal:  Positive for back pain.      Objective:   Physical Exam Constitutional:      Appearance: Normal appearance. He is not ill-appearing.  HENT:     Head:     Comments: No swelling is seen around the face  Cardiovascular:     Rate and Rhythm: Normal rate and regular rhythm.     Pulses: Normal pulses.     Heart sounds: Normal heart sounds.  Pulmonary:     Effort: Pulmonary effort is normal.     Breath sounds: Normal breath sounds.  Neurological:     Mental Status: He is alert.          Assessment & Plan:  His low back pain is under control now with PT. His facial swelling undoubtedly was a side effect of the Prednisone, and it has now resolved.  Alysia Penna, MD

## 2021-09-21 NOTE — Progress Notes (Signed)
Per [name] Call to [employer] to check status of PAP, per representative they will not have updates on renewal/new applications until January of 2023. Pharmacist advised    [place] Clinical Pharmacist Assistant [phone]

## 2021-09-27 ENCOUNTER — Other Ambulatory Visit: Payer: Self-pay | Admitting: Family Medicine

## 2021-10-02 ENCOUNTER — Ambulatory Visit (INDEPENDENT_AMBULATORY_CARE_PROVIDER_SITE_OTHER): Payer: PPO

## 2021-10-02 DIAGNOSIS — I428 Other cardiomyopathies: Secondary | ICD-10-CM

## 2021-10-02 LAB — CUP PACEART REMOTE DEVICE CHECK
Battery Remaining Longevity: 121 mo
Battery Remaining Percentage: 94 %
Battery Voltage: 3.04 V
Brady Statistic RV Percent Paced: 1 %
Date Time Interrogation Session: 20221223012332
HighPow Impedance: 69 Ohm
Implantable Lead Implant Date: 20220623
Implantable Lead Location: 753860
Implantable Lead Model: 7122
Implantable Pulse Generator Implant Date: 20220623
Lead Channel Impedance Value: 400 Ohm
Lead Channel Pacing Threshold Amplitude: 0.75 V
Lead Channel Pacing Threshold Pulse Width: 0.5 ms
Lead Channel Sensing Intrinsic Amplitude: 11.8 mV
Lead Channel Setting Pacing Amplitude: 2 V
Lead Channel Setting Pacing Pulse Width: 0.5 ms
Lead Channel Setting Sensing Sensitivity: 0.5 mV
Pulse Gen Serial Number: 810027079

## 2021-10-10 DIAGNOSIS — I10 Essential (primary) hypertension: Secondary | ICD-10-CM | POA: Diagnosis not present

## 2021-10-10 DIAGNOSIS — I5022 Chronic systolic (congestive) heart failure: Secondary | ICD-10-CM | POA: Diagnosis not present

## 2021-10-11 ENCOUNTER — Other Ambulatory Visit: Payer: Self-pay | Admitting: Family Medicine

## 2021-10-13 NOTE — Progress Notes (Signed)
Remote ICD transmission.   

## 2021-10-14 ENCOUNTER — Ambulatory Visit: Payer: PPO | Admitting: Cardiology

## 2021-10-19 ENCOUNTER — Telehealth: Payer: Self-pay

## 2021-10-19 NOTE — Telephone Encounter (Signed)
Received form from [employer] Patient Assistance Entresto approved until 10/10/22.

## 2021-10-23 ENCOUNTER — Telehealth: Payer: Self-pay | Admitting: Pharmacist

## 2021-10-23 NOTE — Chronic Care Management (AMB) (Signed)
° ° °  Chronic Care Management Pharmacy Assistant   Name: Nicholas Wilkerson  MRN: [id number] DOB: 11-20-1940    Reason for Encounter: [name] PAP follow up   Medications: Outpatient Encounter Medications as of 10/23/2021  Medication Sig   aspirin EC 81 MG tablet Take 81 mg by mouth daily.   atorvastatin (LIPITOR) 40 MG tablet Take 1 tablet (40 mg total) by mouth daily.   B Complex-C (B-COMPLEX WITH VITAMIN C) tablet Take 1 tablet by mouth once a week.   carvedilol (COREG) 25 MG tablet TAKE 1 TABLET BY MOUTH TWICE DAILY WITH A MEAL. TAKE 25 MG   diclofenac (VOLTAREN) 75 MG EC tablet Take 1 tablet (75 mg total) by mouth 2 (two) times daily.   dorzolamide-timolol (COSOPT) 22.3-6.8 MG/ML ophthalmic solution Place 1 drop into both eyes 2 (two) times daily.   empagliflozin (JARDIANCE) 10 MG TABS tablet Take 1 tablet (10 mg total) by mouth daily before breakfast.   empagliflozin (JARDIANCE) 10 MG TABS tablet Take 1 tablet (10 mg total) by mouth daily before breakfast.   fluticasone (FLONASE) 50 MCG/ACT nasal spray Place 2 sprays into both nostrils daily.   furosemide (LASIX) 40 MG tablet Take 1 tablet by mouth once daily   hydrocortisone (PROCTOZONE-HC) 2.5 % rectal cream Place 1 application rectally 2 (two) times daily.   latanoprost (XALATAN) 0.005 % ophthalmic solution Place 1 drop into both eyes at bedtime.    levothyroxine (SYNTHROID) 75 MCG tablet Take 1 tablet (75 mcg total) by mouth daily. *labs required for future refills*   Melatonin 10 MG TABS Take 10 mg by mouth at bedtime.    sacubitril-valsartan (ENTRESTO) 49-51 MG Take 1 tablet by mouth 2 (two) times daily.   sodium fluoride (FLUORISHIELD) 1.1 % GEL dental gel PLACE 1 APPLICATION ONTO THE TEETH AT BEDTIME   spironolactone (ALDACTONE) 25 MG tablet Take 1 tablet by mouth once daily   tamsulosin (FLOMAX) 0.4 MG CAPS capsule Take 1 capsule by mouth once daily   No facility-administered encounter medications on file as of 10/23/2021.   Call to [employer] to follow up on status of application, was advised they are still missing pt ins cards, Chart notes state Cardiology has received approval letter for 23/ MP to send ins cards.  Care Gaps: Hepatitis Screening - Overdue Zoster vaccine overdue TDAP - Overdue COVID Booster #2 - Overdue Flu Vaccine - Overdue CCM - 12/22 AWV - MSG sent to [name] CMA to schedule. BP - 100/60 (08/17/21)  Star Rating Drugs: Atorvastatin (Lipitor)  40mg  - Last filled 03/12/2021 90[facility] at [facility] Empagliflozin (Jardiance) 10mg  - Last filled 05/04/2021 90[facility] at [facility] (Pt gets samples for this)    [place] Pharmacist Assistant [phone]

## 2021-11-02 ENCOUNTER — Other Ambulatory Visit: Payer: Self-pay | Admitting: Cardiology

## 2021-11-05 ENCOUNTER — Other Ambulatory Visit (HOSPITAL_COMMUNITY): Payer: Self-pay

## 2021-11-05 MED FILL — Sodium Fluoride Gel 1.1% (0.5% F): DENTAL | 30 days supply | Qty: 112 | Fill #5 | Status: AC

## 2021-11-09 ENCOUNTER — Telehealth: Payer: PPO

## 2021-11-13 ENCOUNTER — Telehealth: Payer: PPO

## 2021-11-13 ENCOUNTER — Ambulatory Visit (INDEPENDENT_AMBULATORY_CARE_PROVIDER_SITE_OTHER): Payer: PPO

## 2021-11-13 VITALS — Ht 69.0 in | Wt 162.0 lb

## 2021-11-13 DIAGNOSIS — Z0000 Encounter for general adult medical examination without abnormal findings: Secondary | ICD-10-CM | POA: Diagnosis not present

## 2021-11-13 NOTE — Progress Notes (Signed)
Subjective:   Nicholas Wilkerson is a 81 y.o. male who presents for Medicare Annual/Subsequent preventive examination. Review of Systems    Virtual Visit via Telephone Note  I connected with  Nicholas Wilkerson on 11/13/21 at 10:30 AM EST by telephone and verified that I am speaking with the correct person using two identifiers.  Location: Patient: Home Provider: Office Persons participating in the virtual visit: patient/Nurse Health Advisor   I discussed the limitations, risks, security and privacy concerns of performing an evaluation and management service by telephone and the availability of in person appointments. The patient expressed understanding and agreed to proceed.  Interactive audio and video telecommunications were attempted between this nurse and patient, however failed, due to patient having technical difficulties OR patient did not have access to video capability.  We continued and completed visit with audio only.  Some vital signs may be absent or patient reported.   Nicholas Peaches, LPN  Cardiac Risk Factors include: advanced age (>10men, >37 women);hypertension     Objective:    Today's Vitals   11/13/21 1034  Weight: 162 lb (73.5 kg)  Height: 5\' 9"  (1.753 m)   Body mass index is 23.92 kg/m.  Advanced Directives 11/13/2021 04/02/2021 03/27/2021 07/22/2020 05/26/2020 10/21/2017 02/25/2017  Does Patient Have a Medical Advance Directive? Yes Yes Yes Yes Yes Yes Yes  Type of Paramedic of [place];Living will [place];Living will [place];Living will;Out of facility DNR (pink MOST or yellow form) Living will;Healthcare Power of [place];Living will - [place];Living will  Does patient want to make changes to medical advance directive? No - Patient declined No - Patient declined - No - Patient declined No - Patient declined - -  Copy of [place] in Chart? No - copy requested No - copy requested - No - copy requested Yes - validated most recent copy scanned in chart (See row information) - Yes  Would patient like information on creating a medical advance directive? - - - - - - -    Current Medications (verified) Outpatient Encounter Medications as of 11/13/2021  Medication Sig   aspirin EC 81 MG tablet Take 81 mg by mouth daily.   atorvastatin (LIPITOR) 40 MG tablet Take 1 tablet (40 mg total) by mouth daily.   B Complex-C (B-COMPLEX WITH VITAMIN C) tablet Take 1 tablet by mouth once a week.   carvedilol (COREG) 25 MG tablet TAKE 1 TABLET BY MOUTH TWICE DAILY WITH A MEAL   diclofenac (VOLTAREN) 75 MG EC tablet Take 1 tablet (75 mg total) by mouth 2 (two) times daily.   dorzolamide-timolol (COSOPT) 22.3-6.8 MG/ML ophthalmic solution Place 1 drop into both eyes 2 (two) times daily.   empagliflozin (JARDIANCE) 10 MG TABS tablet Take 1 tablet (10 mg total) by mouth daily before breakfast.   empagliflozin (JARDIANCE) 10 MG TABS tablet Take 1 tablet (10 mg total) by mouth daily before breakfast.   fluticasone (FLONASE) 50 MCG/ACT nasal spray Place 2 sprays into both nostrils daily.   furosemide (LASIX) 40 MG tablet Take 1 tablet by mouth once daily   hydrocortisone (PROCTOZONE-HC) 2.5 % rectal cream Place 1 application rectally 2 (two) times daily.   latanoprost (XALATAN) 0.005 % ophthalmic solution Place 1 drop into both eyes at bedtime.    levothyroxine (SYNTHROID) 75 MCG tablet Take 1 tablet (75 mcg total) by mouth daily. *labs required for future refills*  Melatonin 10 MG TABS Take 10 mg by mouth at bedtime.    sacubitril-valsartan (ENTRESTO) 49-51 MG Take 1 tablet by mouth 2 (two) times daily.   sodium fluoride (FLUORISHIELD) 1.1 % GEL dental gel PLACE 1 APPLICATION ONTO THE TEETH AT BEDTIME   spironolactone (ALDACTONE) 25 MG tablet Take 1 tablet by mouth once daily   tamsulosin (FLOMAX) 0.4 MG CAPS capsule Take 1 capsule by  mouth once daily   No facility-administered encounter medications on file as of 11/13/2021.    Allergies (verified) Patient has no known allergies.   History: Past Medical History:  Diagnosis Date   ARTHRITIS 10/22/2008   BPH (benign prostatic hyperplasia)    CHF (congestive heart failure) ([place])    sees Dr. Peter [name]    Dilated cardiomyopathy [facility])    DIVERTICULOSIS, COLON 12/08/2007   Glaucoma    sees Dr. Heather [place]    H/O asbestos exposure    History of echocardiogram 05/22/2007   EF was 45-50% / Mild concentric LV hypertrophy with mild global hypokinesis and overall mild systolic dysfunction .  Mild AV sclerosis / Mild Mitral insufficiency / compared to prior study 04/24/02 -- LV function has improved further.     History of kidney stones    Hypercholesterolemia    Hypertension    Insomnia 06/11/2015   Radiation 01/01/15-02/18/15   base of tongue and bilateral neck 70 Gy   Skin cancer    squamous cell, basal cell   Squamous cell carcinoma 11/20/14   base of tongue primary   Throat cancer ([place]) 10/2014   had chemo   Thyroid disease    Past Surgical History:  Procedure Laterality Date   CARDIAC CATHETERIZATION  10/17/2001   EF estimated at 30% / moderate LV enlargement  / 1. Minimal nonobstructive atherosclerotic coronary artery disease / 2. Severe LV dysfunction with global hypokinesia consistent with dilated nonischemic cardiomyopath / 3. Moderate pulmonary hypertension   COLONOSCOPY  06/23/2017   per Dr. Carlean Purl, clear, no repeats needed    ICD IMPLANT N/A 04/02/2021   Procedure: ICD IMPLANT;  Surgeon: [name], MD;  Location: [facility];  Service: Cardiovascular;  Laterality: N/A;   LYMPH NODE BIOPSY     RIGHT/LEFT HEART CATH AND CORONARY ANGIOGRAPHY N/A 07/22/2020   Procedure: RIGHT/LEFT HEART CATH AND CORONARY ANGIOGRAPHY;  Surgeon: [name], [name], MD;  Location: [facility];  Service: Cardiovascular;  Laterality: N/A;   Family History   Problem Relation Age of Onset   Stroke Father    Cancer Father        kidney ca   Angina Mother    Colon cancer Neg Hx    Esophageal cancer Neg Hx    Rectal cancer Neg Hx    Stomach cancer Neg Hx    Social History   Socioeconomic History   Marital status: Married    Spouse name: Not on file   Number of children: 4   Years of education: Not on file   Highest education level: Not on file  Occupational History   Occupation: plant Lobbyist: RETIRED    Comment: retired  Tobacco Use   Smoking status: Never   Smokeless tobacco: Never  Vaping Use   Vaping Use: Never used  Substance and Sexual Activity   Alcohol use: Yes    Alcohol/week: 0.0 standard drinks    Comment: occasional beer, a couple times a month   Drug use: No   Sexual activity:  Not on file    Comment: retired Pension scheme manager, married  Other Topics Concern   Not on file  Social History Narrative   Married, retired Pension scheme manager 4 daughters, one of whom is a Marine scientist. Several grandchildren who he loves to play with.   1 coffee daily   02/21/2017   Social Determinants of Health   Financial Resource Strain: High Risk   Difficulty of Paying Living Expenses: Very hard  Food Insecurity: No Food Insecurity   Worried About Charity fundraiser in the Last Year: Never true   Ran Out of Food in the Last Year: Never true  Transportation Needs: No Transportation Needs   Lack of Transportation (Medical): No   Lack of Transportation (Non-Medical): No  Physical Activity: Sufficiently Active   Days of Exercise per Week: 7 days   Minutes of Exercise per Session: 60 min  Stress: No Stress Concern Present   Feeling of Stress : Not at all  Social Connections: Socially Integrated   Frequency of Communication with Friends and Family: More than three times a week   Frequency of Social Gatherings with Friends and Family: More than three times a week   Attends Religious Services: More than 4 times per year   Active  Member of [employer] or Organizations: Yes   Attends Music therapist: More than 4 times per year   Marital Status: Married    Clinical Intake:   Diabetic? No  Interpreter Needed?: No  Information entered by :: Rolene Arbour LPN   Activities of Daily Living In your present state of health, do you have any difficulty performing the following activities: 11/13/2021 08/17/2021  Hearing? N N  Vision? N N  Difficulty concentrating or making decisions? N N  Walking or climbing stairs? N N  Dressing or bathing? N N  Doing errands, shopping? N N  Preparing Food and eating ? N -  Using the Toilet? N -  In the past six months, have you accidently leaked urine? N -  Do you have problems with loss of bowel control? N -  Managing your Medications? N -  Managing your Finances? N -  Housekeeping or managing your Housekeeping? N -  Some recent data might be hidden    Patient Care Team: Laurey Morale, MD as PCP - General (Family Medicine) [name], MD as PCP - Electrophysiology (Cardiology) Heath Lark, MD as Consulting Physician (Hematology and Oncology) Melissa Montane, MD as Consulting Physician (Otolaryngology) Eppie Gibson, MD as Attending Physician (Radiation Oncology) Holley Bouche, NP (Inactive) as Nurse Practitioner (Nurse Practitioner) [name], [name], MD as Consulting Physician (Cardiology) [place], Heather, [employer] (Optometry) Viona Gilmore, [facility] as Pharmacist (Pharmacist)  Indicate any recent Medical Services you may have received from other than [facility] providers in the past year (date may be approximate).     Assessment:   This is a routine wellness examination for [place].  Hearing/Vision screen Hearing Screening - Comments:: No difficulty hearing Vision Screening - Comments:: Wears glasses. Followed by [facility]  Dietary issues and exercise activities discussed: Current Exercise Habits: Home exercise routine, Type of exercise: walking, Time  (Minutes): 60, Frequency (Times/Week): 7, Weekly Exercise (Minutes/Week): 420, Intensity: Moderate, Exercise limited by: None identified   Goals Addressed             This Visit's Progress    Exercise 150 min/wk Moderate Activity       Do more exercise;  Keep walking weather permitted.  Depression Screen PHQ 2/9 Scores 11/13/2021 09/14/2021 10/15/2020 05/26/2020 10/24/2018 09/29/2018 10/21/2017  PHQ - 2 Score 0 0 0 0 0 0 0  PHQ- 9 Score 0 0 - 0 0 - -    Fall Risk Fall Risk  11/13/2021 09/14/2021 10/15/2020 05/26/2020 10/24/2018  Falls in the past year? 0 0 0 0 1  Comment - - - - fell off ladder while cleaning gutters; seen in ED, only broke a finger  Number falls in past yr: 0 0 - 0 0  Injury with Fall? 0 0 - 0 1  Risk for fall due to : - No Fall Risks - History of fall(s);Medication side effect History of fall(s)  Follow up - - - Falls evaluation completed;Falls prevention discussed Education provided    FALL RISK PREVENTION PERTAINING TO THE HOME:  Any stairs in or around the home? Yes  If so, are there any without handrails? No  Home free of loose throw rugs in walkways, pet beds, electrical cords, etc? Yes  Adequate lighting in your home to reduce risk of falls? Yes   ASSISTIVE DEVICES UTILIZED TO PREVENT FALLS:  Life alert? No  Use of a cane, walker or w/c? No  Grab bars in the bathroom? No  Shower chair or bench in shower? Yes  Elevated toilet seat or a handicapped toilet? Yes  TIMED UP AND GO:  Was the test performed? No . Audio Visit  Cognitive Function: MMSE - Mini Mental State Exam 10/21/2017  Not completed: (No Data)     Immunizations Immunization History  Administered Date(s) Administered   Fluad Quad(high Dose 65+) 10/15/2019, 10/15/2020, 09/14/2021   Influenza Split 08/11/2011, 08/31/2012   Influenza Whole 08/18/2007, 07/11/2008, 08/12/2009, 06/25/2010   Influenza, High Dose Seasonal PF 07/31/2013, 07/01/2016, 07/18/2017, 08/09/2018    Influenza,inj,Quad PF,6+ Mos 07/31/2014, 06/13/2015   Janssen (J&J) SARS-COV-2 Vaccination 02/09/2020   Pneumococcal Conjugate-13 09/23/2016   Pneumococcal Polysaccharide-23 06/19/2009   Td 06/27/2008   Zoster Recombinat (Shingrix) 02/21/2018   Zoster, Live 07/11/2008    TDAP status: Due, Education has been provided regarding the importance of this vaccine. Advised may receive this vaccine at local pharmacy or Health Dept. Aware to provide a copy of the vaccination record if obtained from local pharmacy or Health Dept. Verbalized acceptance and understanding.  Flu Vaccine status: Up to date  Pneumococcal vaccine status: Up to date  Covid-19 vaccine status: Information provided on how to obtain vaccines.   Qualifies for Shingles Vaccine? Yes   Zostavax completed No   Shingrix Completed?: No.    Education has been provided regarding the importance of this vaccine. Patient has been advised to call insurance company to determine out of pocket expense if they have not yet received this vaccine. Advised may also receive vaccine at local pharmacy or Health Dept. Verbalized acceptance and understanding.  Screening Tests Health Maintenance  Topic Date Due   COVID-19 Vaccine (2 - Janssen risk series) 11/29/2021 (Originally 03/08/2020)   Zoster Vaccines- Shingrix (2 of 2) 02/10/2022 (Originally 04/18/2018)   TETANUS/TDAP  11/13/2022 (Originally 06/27/2018)   Hepatitis C Screening  11/13/2022 (Originally 09/01/1959)   Pneumonia Vaccine 72+ Years old  Completed   INFLUENZA VACCINE  Completed   HPV VACCINES  Aged Out   COLONOSCOPY (Pts 45-18yrs Insurance coverage will need to be confirmed)  Discontinued    Health Maintenance  There are no preventive care reminders to display for this patient.  Additional Screening:  Hepatitis C Screening: Does not qualify.  Vision Screening: Recommended  annual ophthalmology exams for early detection of glaucoma and other disorders of the eye. Is the patient  up to date with their annual eye exam?  Yes  Who is the provider or what is the name of the office in which the patient attends annual eye exams? [facility] If pt is not established with a provider, would they like to be referred to a provider to establish care? No .   Dental Screening: Recommended annual dental exams for proper oral hygiene  Community Resource Referral / Chronic Care Management: CRR required this visit?  No   CCM required this visit?  No      Plan:     I have personally reviewed and noted the following in the patients chart:   Medical and social history Use of alcohol, tobacco or illicit drugs  Current medications and supplements including opioid prescriptions. Patient is not currently taking opioid prescriptions. Functional ability and status Nutritional status Physical activity Advanced directives List of other physicians Hospitalizations, surgeries, and ER visits in previous 12 months Vitals Screenings to include cognitive, depression, and falls Referrals and appointments  In addition, I have reviewed and discussed with patient certain preventive protocols, quality metrics, and best practice recommendations. A written personalized care plan for preventive services as well as general preventive health recommendations were provided to patient.     Nicholas Peaches, LPN   9/0/3009   Nurse Notes: None

## 2021-11-13 NOTE — Patient Instructions (Signed)
Nicholas Wilkerson , Thank you for taking time to come for your Medicare Wellness Visit. I appreciate your ongoing commitment to your health goals. Please review the following plan we discussed and let me know if I can assist you in the future.   These are the goals we discussed:  Goals      Blood Pressure < 130/80     Exercise 150 min/wk Moderate Activity     Do more exercise;  Keep walking weather permitted.      Patient Stated     Continue staying active walking, playing with grandkids.     Patient Stated     I want to get my health back to normal and get energy levels back up         This is a list of the screening recommended for you and due dates:  Health Maintenance  Topic Date Due   COVID-19 Vaccine (2 - Janssen risk series) 11/29/2021*   Zoster (Shingles) Vaccine (2 of 2) 02/10/2022*   Tetanus Vaccine  11/13/2022*   Hepatitis C Screening: USPSTF Recommendation to screen - Ages 18-79 yo.  11/13/2022*   Pneumonia Vaccine  Completed   Flu Shot  Completed   HPV Vaccine  Aged Out   Colon Cancer Screening  Discontinued  *Topic was postponed. The date shown is not the original due date.   Advanced directives: Yes   Conditions/risks identified: None  Next appointment: Follow up in one year for your annual wellness visit.   Preventive Care 79 Years and Older, Male Preventive care refers to lifestyle choices and visits with your health care provider that can promote health and wellness. What does preventive care include? A yearly physical exam. This is also called an annual well check. Dental exams once or twice a year. Routine eye exams. Ask your health care provider how often you should have your eyes checked. Personal lifestyle choices, including: Daily care of your teeth and gums. Regular physical activity. Eating a healthy diet. Avoiding tobacco and drug use. Limiting alcohol use. Practicing safe sex. Taking low doses of aspirin every day. Taking vitamin and  mineral supplements as recommended by your health care provider. What happens during an annual well check? The services and screenings done by your health care provider during your annual well check will depend on your age, overall health, lifestyle risk factors, and family history of disease. Counseling  Your health care provider may ask you questions about your: Alcohol use. Tobacco use. Drug use. Emotional well-being. Home and relationship well-being. Sexual activity. Eating habits. History of falls. Memory and ability to understand (cognition). Work and work Statistician. Screening  You may have the following tests or measurements: Height, weight, and BMI. Blood pressure. Lipid and cholesterol levels. These may be checked every 5 years, or more frequently if you are over 73 years old. Skin check. Lung cancer screening. You may have this screening every year starting at age 79 if you have a 30-pack-year history of smoking and currently smoke or have quit within the past 15 years. Fecal occult blood test (FOBT) of the stool. You may have this test every year starting at age 79 Flexible sigmoidoscopy or colonoscopy. You may have a sigmoidoscopy every 5 years or a colonoscopy every 10 years starting at age 79. Prostate cancer screening. Recommendations will vary depending on your family history and other risks. Hepatitis C blood test. Hepatitis B blood test. Sexually transmitted disease (STD) testing. Diabetes screening. This is done by checking your blood  sugar (glucose) after you have not eaten for a while (fasting). You may have this done every 1-3 years. Abdominal aortic aneurysm (AAA) screening. You may need this if you are a current or former smoker. Osteoporosis. You may be screened starting at age 79 if you are at high risk. Talk with your health care provider about your test results, treatment options, and if necessary, the need for more tests. Vaccines  Your health care  provider may recommend certain vaccines, such as: Influenza vaccine. This is recommended every year. Tetanus, diphtheria, and acellular pertussis (Tdap, Td) vaccine. You may need a Td booster every 10 years. Zoster vaccine. You may need this after age 49. Pneumococcal 13-valent conjugate (PCV13) vaccine. One dose is recommended after age 79. Pneumococcal polysaccharide (PPSV23) vaccine. One dose is recommended after age 79. Talk to your health care provider about which screenings and vaccines you need and how often you need them. This information is not intended to replace advice given to you by your health care provider. Make sure you discuss any questions you have with your health care provider. Document Released: 10/24/2015 Document Revised: 06/16/2016 Document Reviewed: 07/29/2015 Elsevier Interactive Patient Education  2017 [place] Prevention in the Home Falls can cause injuries. They can happen to people of all ages. There are many things you can do to make your home safe and to help prevent falls. What can I do on the outside of my home? Regularly fix the edges of walkways and driveways and fix any cracks. Remove anything that might make you trip as you walk through a door, such as a raised step or threshold. Trim any bushes or trees on the path to your home. Use bright outdoor lighting. Clear any walking paths of anything that might make someone trip, such as rocks or tools. Regularly check to see if handrails are loose or broken. Make sure that both sides of any steps have handrails. Any raised decks and porches should have guardrails on the edges. Have any leaves, snow, or ice cleared regularly. Use sand or salt on walking paths during winter. Clean up any spills in your garage right away. This includes oil or grease spills. What can I do in the bathroom? Use night lights. Install grab bars by the toilet and in the tub and shower. Do not use towel bars as grab  bars. Use non-skid mats or decals in the tub or shower. If you need to sit down in the shower, use a plastic, non-slip stool. Keep the floor dry. Clean up any water that spills on the floor as soon as it happens. Remove soap buildup in the tub or shower regularly. Attach bath mats securely with double-sided non-slip rug tape. Do not have throw rugs and other things on the floor that can make you trip. What can I do in the bedroom? Use night lights. Make sure that you have a light by your bed that is easy to reach. Do not use any sheets or blankets that are too big for your bed. They should not hang down onto the floor. Have a firm chair that has side arms. You can use this for support while you get dressed. Do not have throw rugs and other things on the floor that can make you trip. What can I do in the kitchen? Clean up any spills right away. Avoid walking on wet floors. Keep items that you use a lot in easy-to-reach places. If you need to reach something above you,  use a strong step stool that has a grab bar. Keep electrical cords out of the way. Do not use floor polish or wax that makes floors slippery. If you must use wax, use non-skid floor wax. Do not have throw rugs and other things on the floor that can make you trip. What can I do with my stairs? Do not leave any items on the stairs. Make sure that there are handrails on both sides of the stairs and use them. Fix handrails that are broken or loose. Make sure that handrails are as long as the stairways. Check any carpeting to make sure that it is firmly attached to the stairs. Fix any carpet that is loose or worn. Avoid having throw rugs at the top or bottom of the stairs. If you do have throw rugs, attach them to the floor with carpet tape. Make sure that you have a light switch at the top of the stairs and the bottom of the stairs. If you do not have them, ask someone to add them for you. What else can I do to help prevent  falls? Wear shoes that: Do not have high heels. Have rubber bottoms. Are comfortable and fit you well. Are closed at the toe. Do not wear sandals. If you use a stepladder: Make sure that it is fully opened. Do not climb a closed stepladder. Make sure that both sides of the stepladder are locked into place. Ask someone to hold it for you, if possible. Clearly mark and make sure that you can see: Any grab bars or handrails. First and last steps. Where the edge of each step is. Use tools that help you move around (mobility aids) if they are needed. These include: Canes. Walkers. Scooters. Crutches. Turn on the lights when you go into a dark area. Replace any light bulbs as soon as they burn out. Set up your furniture so you have a clear path. Avoid moving your furniture around. If any of your floors are uneven, fix them. If there are any pets around you, be aware of where they are. Review your medicines with your doctor. Some medicines can make you feel dizzy. This can increase your chance of falling. Ask your doctor what other things that you can do to help prevent falls. This information is not intended to replace advice given to you by your health care provider. Make sure you discuss any questions you have with your health care provider. Document Released: 07/24/2009 Document Revised: 03/04/2016 Document Reviewed: 11/01/2014 Elsevier Interactive Patient Education  2017 [employer].

## 2021-11-15 ENCOUNTER — Other Ambulatory Visit: Payer: Self-pay | Admitting: Cardiology

## 2021-11-21 ENCOUNTER — Other Ambulatory Visit: Payer: Self-pay | Admitting: Family Medicine

## 2021-11-22 ENCOUNTER — Other Ambulatory Visit: Payer: Self-pay | Admitting: Family Medicine

## 2021-12-07 ENCOUNTER — Other Ambulatory Visit (HOSPITAL_COMMUNITY): Payer: Self-pay

## 2021-12-11 ENCOUNTER — Other Ambulatory Visit (HOSPITAL_COMMUNITY): Payer: Self-pay

## 2021-12-14 ENCOUNTER — Other Ambulatory Visit (HOSPITAL_COMMUNITY): Payer: Self-pay

## 2021-12-15 ENCOUNTER — Other Ambulatory Visit: Payer: Self-pay | Admitting: Radiation Oncology

## 2021-12-15 ENCOUNTER — Other Ambulatory Visit (HOSPITAL_COMMUNITY): Payer: Self-pay

## 2021-12-15 MED ORDER — SODIUM FLUORIDE 1.1 % DT GEL
DENTAL | 11 refills | Status: AC
  Filled 2021-12-15: qty 112, 60d supply, fill #0
  Filled 2022-02-22: qty 112, 60d supply, fill #1
  Filled 2022-04-27: qty 112, 60d supply, fill #2
  Filled 2022-08-04: qty 112, 60d supply, fill #3

## 2021-12-20 ENCOUNTER — Other Ambulatory Visit: Payer: Self-pay | Admitting: Family Medicine

## 2021-12-23 ENCOUNTER — Telehealth: Payer: Self-pay | Admitting: Pharmacist

## 2021-12-23 NOTE — Chronic Care Management (AMB) (Signed)
? ? ?Chronic Care Management ?Pharmacy Assistant  ? ?Name: Nicholas Wilkerson  MRN: [id number] DOB: 1941-03-29 ? ?Reason for Encounter: Disease State Hypertension Assessment ?  ?Conditions to be addressed/monitored: ?HTN ? ?Recent office visits:  ?11/13/21 Criselda Peaches, LPN - Patient presented for [facility] Annual Wellness exam. No medication changes. ? ?Recent consult visits:  ?None ? ?Hospital visits:  ?None in previous 6 months ? ?Medications: ?Outpatient Encounter Medications as of 12/23/2021  ?Medication Sig  ? spironolactone (ALDACTONE) 25 MG tablet Take 1 tablet (25 mg total) by mouth daily. Keep upcoming appointment for future refills.  ? aspirin EC 81 MG tablet Take 81 mg by mouth daily.  ? atorvastatin (LIPITOR) 40 MG tablet Take 1 tablet by mouth once daily  ? B Complex-C (B-COMPLEX WITH VITAMIN C) tablet Take 1 tablet by mouth once a week.  ? carvedilol (COREG) 25 MG tablet TAKE 1 TABLET BY MOUTH TWICE DAILY WITH A MEAL  ? diclofenac (VOLTAREN) 75 MG EC tablet Take 1 tablet (75 mg total) by mouth 2 (two) times daily.  ? dorzolamide-timolol (COSOPT) 22.3-6.8 MG/ML ophthalmic solution Place 1 drop into both eyes 2 (two) times daily.  ? empagliflozin (JARDIANCE) 10 MG TABS tablet Take 1 tablet (10 mg total) by mouth daily before breakfast.  ? empagliflozin (JARDIANCE) 10 MG TABS tablet Take 1 tablet (10 mg total) by mouth daily before breakfast.  ? fluticasone (FLONASE) 50 MCG/ACT nasal spray Use 2 spray(s) in each nostril once daily  ? furosemide (LASIX) 40 MG tablet Take 1 tablet by mouth once daily  ? hydrocortisone (PROCTOZONE-HC) 2.5 % rectal cream Place 1 application rectally 2 (two) times daily.  ? latanoprost (XALATAN) 0.005 % ophthalmic solution Place 1 drop into both eyes at bedtime.   ? levothyroxine (SYNTHROID) 75 MCG tablet Take 1 tablet (75 mcg total) by mouth daily at 6 (six) AM. *labs required for future refills  ? Melatonin 10 MG TABS Take 10 mg by mouth at bedtime.   ? sacubitril-valsartan  (ENTRESTO) 49-51 MG Take 1 tablet by mouth 2 (two) times daily.  ? sodium fluoride (FLUORISHIELD) 1.1 % GEL dental gel PLACE 1 APPLICATION ONTO THE TEETH AT BEDTIME  ? tamsulosin (FLOMAX) 0.4 MG CAPS capsule Take 1 capsule by mouth once daily  ? ?No facility-administered encounter medications on file as of 12/23/2021.  ?Reviewed chart prior to disease state call. Spoke with patient regarding BP ? ?Recent Office Vitals: ?BP Readings from Last 3 Encounters:  ?09/14/21 110/60  ?08/17/21 100/60  ?07/16/21 118/64  ? ?Pulse Readings from Last 3 Encounters:  ?08/17/21 76  ?07/16/21 73  ?07/10/21 (!) 49  ?  ?Wt Readings from Last 3 Encounters:  ?11/13/21 162 lb (73.5 kg)  ?09/14/21 162 lb (73.5 kg)  ?08/17/21 161 lb (73 kg)  ?  ? ?Kidney Function ?Lab Results  ?Component Value Date/Time  ? CREATININE 1.27 07/10/2021 03:55 PM  ? CREATININE 1.72 (H) 03/27/2021 12:14 PM  ? CREATININE 1.35 (H) 11/19/2016 11:54 AM  ? CREATININE 1.3 06/21/2016 08:49 AM  ? CREATININE 1.3 12/12/2015 10:28 AM  ? GFR 59.42 (L) 10/15/2019 10:58 AM  ? GFRNONAA 54 (L) 10/07/2020 11:12 AM  ? GFRAA 63 10/07/2020 11:12 AM  ? ? ?BMP Latest Ref Rng & Units 07/10/2021 03/27/2021 02/12/2021  ?Glucose 70 - 99 mg/dL 108(H) 102(H) 97  ?BUN 8 - 27 mg/dL 20 35(H) 31(H)  ?Creatinine 0.76 - 1.27 mg/dL 1.27 1.72(H) 1.48(H)  ?BUN/Creat Ratio 10 - '24 16 20 21  '$ ?Sodium  134 - 144 mmol/L 142 138 139  ?Potassium 3.5 - 5.2 mmol/L 4.3 4.6 5.2  ?Chloride 96 - 106 mmol/L 103 100 101  ?CO2 20 - 29 mmol/L '25 23 23  '$ ?Calcium 8.6 - 10.2 mg/dL 9.6 9.3 9.7  ? ? ?Current antihypertensive regimen:  ?Carvedilol '25mg'$ , 1 tablet twice daily  ?Entresto 49/51 mg, 1 tablet twice daily ?Spironolactone 25 mg 1 tablet daily ?How often are you checking your Blood Pressure? weekly ?Current home BP readings: 110/70. Patient reports some dizziness if he stands up too fast after he catches his balance it does go away. No other hypotensive symptoms. ?What recent interventions/DTPs have been made by any  provider to improve Blood Pressure control since last CPP Visit: Patient reports no changes ?Any recent hospitalizations or ED visits since last visit with CPP? No ?What exercise is being done to improve your Blood Pressure Control?  ?Patient reports does walking and some exercise/playing with his grandchildren ? ?Adherence Review: ?Is the patient currently on ACE/ARB medication? No ?Does the patient have >5 day gap between last estimated fill dates? No ? ? ?Care Gaps: ?COVID Booster - Overdue ?AWV- 2/23 ?BP- 110/70 ( 12/23/21 home) ?CCM- 6/23 ? ?Star Rating Drugs: ?Atorvastatin (Lipitor)  '40mg'$  - Last filled 11/23/21 90DS at [facility] ?Empagliflozin (Jardiance) '10mg'$  - Last filled 11/03/21 90DS at [facility]  ? ?Patient Assistance: ?Entresto -2023 ? ?Ned Clines CMA ?Clinical Pharmacist Assistant ?[phone] ? ?

## 2021-12-27 ENCOUNTER — Other Ambulatory Visit: Payer: Self-pay | Admitting: Family Medicine

## 2022-01-01 ENCOUNTER — Ambulatory Visit (INDEPENDENT_AMBULATORY_CARE_PROVIDER_SITE_OTHER): Payer: PPO

## 2022-01-01 DIAGNOSIS — I428 Other cardiomyopathies: Secondary | ICD-10-CM

## 2022-01-04 LAB — CUP PACEART REMOTE DEVICE CHECK
Battery Remaining Longevity: 117 mo
Battery Remaining Percentage: 91 %
Battery Voltage: 3.04 V
Brady Statistic RV Percent Paced: 1 %
Date Time Interrogation Session: 20230324030136
HighPow Impedance: 68 Ohm
Implantable Lead Implant Date: 20220623
Implantable Lead Location: 753860
Implantable Lead Model: 7122
Implantable Pulse Generator Implant Date: 20220623
Lead Channel Impedance Value: 400 Ohm
Lead Channel Pacing Threshold Amplitude: 0.75 V
Lead Channel Pacing Threshold Pulse Width: 0.5 ms
Lead Channel Sensing Intrinsic Amplitude: 11.8 mV
Lead Channel Setting Pacing Amplitude: 2 V
Lead Channel Setting Pacing Pulse Width: 0.5 ms
Lead Channel Setting Sensing Sensitivity: 0.5 mV
Pulse Gen Serial Number: 810027079

## 2022-01-05 NOTE — Progress Notes (Deleted)
? ?Nicholas Wilkerson ?Date of Birth: Sep 26, 1941 ? ? ?History of Present Illness: ?Nicholas Wilkerson is seen for followup. He has a history of dilated cardiomyopathy with initial ejection fraction of 30% over 18 years ago. Subsequent evaluation has demonstrated improvement to 50-55%. Last Echo was in January 2018 showing decrease in EF to 35-40%. ACEi was resumed at that time. . 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 He was treated with RT and chemo.  ? ?Last November he complained of some palpitations. Event monitor short short bursts of SVT and NSVT. No bradycardia or pauses. We increased his Coreg dose at that time. ? ?He was seen in February and he noted he had extensive Urologic evaluation for an elevated PSA with no cancer found. He did complain of getting tired real easy with activity.  No edema. No chest pain. He does note some change in his breathing.  We updated an Echocardiogram which demonstrated significant decrease in LV function with  EF down to 20-25%. There was also evidence of significant pulmonary HTN.  ? ?We performed cardiac cath showing nonobstructive CAD. Moderately elevated LV filling pressures. Moderate pulmonary HTN.  He was started on Lasix 40 mg daily. Continued Coreg and Jardiance. Added Entresto. Now up to 49/51 mg bid. Added aldactone 25 mg daily. He is tolerating this well. He has noted that if he takes everything at once in the morning he gets real lightheaded. He states this is much better.   Still active walking and playing golf.  ? ?After optimization of medication Echo was repeated and was unchanged with EF 20-25%. He was seen by Dr Curt Bears and ICD was implanted in June.  He has NSVT and SVT on pacer check.  He was unaware. He states he feels great. No cardiac limitations. Did have acute back pain that kept him in bed for a week but this is better. Dr Curt Bears recommended continued Coreg.  ? ? ?Current Outpatient Medications on File Prior to Visit  ?Medication Sig  Dispense Refill  ? spironolactone (ALDACTONE) 25 MG tablet Take 1 tablet (25 mg total) by mouth daily. Keep upcoming appointment for future refills. 90 tablet 0  ? aspirin EC 81 MG tablet Take 81 mg by mouth daily.    ? atorvastatin (LIPITOR) 40 MG tablet Take 1 tablet by mouth once daily 90 tablet 0  ? B Complex-C (B-COMPLEX WITH VITAMIN C) tablet Take 1 tablet by mouth once a week.    ? carvedilol (COREG) 25 MG tablet TAKE 1 TABLET BY MOUTH TWICE DAILY WITH A MEAL 180 tablet 0  ? diclofenac (VOLTAREN) 75 MG EC tablet Take 1 tablet (75 mg total) by mouth 2 (two) times daily. 180 tablet 3  ? dorzolamide-timolol (COSOPT) 22.3-6.8 MG/ML ophthalmic solution Place 1 drop into both eyes 2 (two) times daily.    ? empagliflozin (JARDIANCE) 10 MG TABS tablet Take 1 tablet (10 mg total) by mouth daily before breakfast. 90 tablet 3  ? empagliflozin (JARDIANCE) 10 MG TABS tablet Take 1 tablet (10 mg total) by mouth daily before breakfast. 28 tablet 0  ? fluticasone (FLONASE) 50 MCG/ACT nasal spray Use 2 spray(s) in each nostril once daily 16 g 0  ? furosemide (LASIX) 40 MG tablet Take 1 tablet by mouth once daily 90 tablet 2  ? hydrocortisone (PROCTOZONE-HC) 2.5 % rectal cream Place 1 application rectally 2 (two) times daily. 28 g 5  ? latanoprost (XALATAN) 0.005 % ophthalmic solution Place 1 drop into  both eyes at bedtime.     ? levothyroxine (SYNTHROID) 75 MCG tablet Take 1 tablet (75 mcg total) by mouth daily at 6 (six) AM. *labs required for future refills 30 tablet 0  ? Melatonin 10 MG TABS Take 10 mg by mouth at bedtime.     ? sacubitril-valsartan (ENTRESTO) 49-51 MG Take 1 tablet by mouth 2 (two) times daily. 180 tablet 3  ? sodium fluoride (FLUORISHIELD) 1.1 % GEL dental gel PLACE 1 APPLICATION ONTO THE TEETH AT BEDTIME 112 mL 11  ? tamsulosin (FLOMAX) 0.4 MG CAPS capsule Take 1 capsule by mouth once daily 90 capsule 0  ? ?No current facility-administered medications on file prior to visit.  ? ? ?No Known  Allergies ? ?Past Medical History:  ?Diagnosis Date  ? ARTHRITIS 10/22/2008  ? BPH (benign prostatic hyperplasia)   ? CHF (congestive heart failure) ([place])   ? sees Dr. Sandrina Heaton [name]   ? Dilated cardiomyopathy ([place])   ? DIVERTICULOSIS, COLON 12/08/2007  ? Glaucoma   ? sees Dr. Heather [place]   ? H/O asbestos exposure   ? History of echocardiogram 05/22/2007  ? EF was 45-50% / Mild concentric LV hypertrophy with mild global hypokinesis and overall mild systolic dysfunction .  Mild AV sclerosis / Mild Mitral insufficiency / compared to prior study 04/24/02 -- LV function has improved further.    ? History of kidney stones   ? Hypercholesterolemia   ? Hypertension   ? Insomnia 06/11/2015  ? Radiation 01/01/15-02/18/15  ? base of tongue and bilateral neck 70 Gy  ? Skin cancer   ? squamous cell, basal cell  ? Squamous cell carcinoma 11/20/14  ? base of tongue primary  ? Throat cancer ([place]) 10/2014  ? had chemo  ? Thyroid disease   ? ? ?Past Surgical History:  ?Procedure Laterality Date  ? CARDIAC CATHETERIZATION  10/17/2001  ? EF estimated at 30% / moderate LV enlargement  / 1. Minimal nonobstructive atherosclerotic coronary artery disease / 2. Severe LV dysfunction with global hypokinesia consistent with dilated nonischemic cardiomyopath / 3. Moderate pulmonary hypertension  ? COLONOSCOPY  06/23/2017  ? per Dr. Carlean Purl, clear, no repeats needed   ? ICD IMPLANT N/A 04/02/2021  ? Procedure: ICD IMPLANT;  Surgeon: [name], MD;  Location: [facility];  Service: Cardiovascular;  Laterality: N/A;  ? LYMPH NODE BIOPSY    ? RIGHT/LEFT HEART CATH AND CORONARY ANGIOGRAPHY N/A 07/22/2020  ? Procedure: RIGHT/LEFT HEART CATH AND CORONARY ANGIOGRAPHY;  Surgeon: [name], Coston Mandato M, MD;  Location: [facility];  Service: Cardiovascular;  Laterality: N/A;  ? ? ?Social History  ? ?Tobacco Use  ?Smoking Status Never  ?Smokeless Tobacco Never  ? ? ?Social History  ? ?Substance and Sexual Activity  ?Alcohol Use Yes  ?  Alcohol/week: 0.0 standard drinks  ? Comment: occasional beer, a couple times a month  ? ? ?Family History  ?Problem Relation Age of Onset  ? Stroke Father   ? Cancer Father   ?     kidney ca  ? Angina Mother   ? Colon cancer Neg Hx   ? Esophageal cancer Neg Hx   ? Rectal cancer Neg Hx   ? Stomach cancer Neg Hx   ? ? ?Review of Systems: ? All other systems were reviewed and are negative. ? ?Physical Exam: ?There were no vitals taken for this visit. ?GENERAL:  Well appearing WM in NAD ?HEENT:  PERRL, EOMI, sclera are clear. Oropharynx is  clear. ?NECK:  No jugular venous distention, carotid upstroke brisk and symmetric, no bruits, no thyromegaly or adenopathy ?LUNGS:  Clear to auscultation bilaterally ?CHEST:  Unremarkable ?HEART:  RRR with extrasystoles,  PMI not displaced or sustained,S1 and S2 within normal limits, no S3, no S4: no clicks, no rubs, no murmurs ?ABD:  Soft, nontender. BS +, no masses or bruits. No hepatomegaly, no splenomegaly ?EXT:  2 + pulses throughout, no edema, no cyanosis no clubbing ?SKIN:  Warm and dry.  No rashes ?NEURO:  Alert and oriented x 3. Cranial nerves II through XII intact. ?PSYCH:  Cognitively intact ? ? ?LABORATORY DATA: ? ?Lab Results  ?Component Value Date  ? WBC 10.7 03/27/2021  ? HGB 16.1 03/27/2021  ? HCT 48.1 03/27/2021  ? PLT 225 03/27/2021  ? GLUCOSE 108 (H) 07/10/2021  ? CHOL 106 02/12/2021  ? TRIG 57 02/12/2021  ? HDL 41 02/12/2021  ? [place] 52 02/12/2021  ? ALT 21 07/15/2020  ? AST 20 07/15/2020  ? NA 142 07/10/2021  ? K 4.3 07/10/2021  ? CL 103 07/10/2021  ? CREATININE 1.27 07/10/2021  ? BUN 20 07/10/2021  ? CO2 25 07/10/2021  ? TSH 1.89 10/15/2020  ? PSA 7.93 (H) 10/15/2019  ? INR 0.95 07/17/2015  ? HGBA1C 5.9 (A) 04/27/2021  ? ? ? ?Echo 11/02/16: Study Conclusions ?  ?- Left ventricle: The cavity size was mildly dilated. Wall ?  thickness was normal. Systolic function was moderately reduced. ?  The estimated ejection fraction was in the range of 35% to 40%. ?   Diffuse hypokinesis. Echogenic linear structure at the apex, ?  suggestive of calcified apical false tendon. Doppler parameters ?  are consistent with pseudonormal left ventricular relaxation ?  (grade 2 diastolic dysfunction). The E/e&

## 2022-01-06 DIAGNOSIS — H401131 Primary open-angle glaucoma, bilateral, mild stage: Secondary | ICD-10-CM | POA: Diagnosis not present

## 2022-01-06 NOTE — Progress Notes (Signed)
Remote ICD transmission.   

## 2022-01-07 ENCOUNTER — Ambulatory Visit: Payer: PPO | Admitting: Cardiology

## 2022-01-07 ENCOUNTER — Encounter: Payer: Self-pay | Admitting: Cardiology

## 2022-01-07 VITALS — BP 108/62 | HR 53 | Ht 69.0 in | Wt 163.6 lb

## 2022-01-07 DIAGNOSIS — Z95810 Presence of automatic (implantable) cardiac defibrillator: Secondary | ICD-10-CM | POA: Diagnosis not present

## 2022-01-07 DIAGNOSIS — I428 Other cardiomyopathies: Secondary | ICD-10-CM | POA: Diagnosis not present

## 2022-01-07 DIAGNOSIS — I4729 Other ventricular tachycardia: Secondary | ICD-10-CM

## 2022-01-07 DIAGNOSIS — I5022 Chronic systolic (congestive) heart failure: Secondary | ICD-10-CM

## 2022-01-07 LAB — BASIC METABOLIC PANEL
BUN/Creatinine Ratio: 18 (ref 10–24)
BUN: 39 mg/dL — ABNORMAL HIGH (ref 8–27)
CO2: 26 mmol/L (ref 20–29)
Calcium: 9.5 mg/dL (ref 8.6–10.2)
Chloride: 99 mmol/L (ref 96–106)
Creatinine, Ser: 2.12 mg/dL — ABNORMAL HIGH (ref 0.76–1.27)
Glucose: 104 mg/dL — ABNORMAL HIGH (ref 70–99)
Potassium: 5.1 mmol/L (ref 3.5–5.2)
Sodium: 136 mmol/L (ref 134–144)
eGFR: 31 mL/min/{1.73_m2} — ABNORMAL LOW (ref >59)

## 2022-01-07 LAB — HEPATIC FUNCTION PANEL
ALT: 19 IU/L (ref 0–44)
AST: 22 IU/L (ref 0–40)
Albumin: 4.6 g/dL (ref 3.7–4.7)
Alkaline Phosphatase: 92 IU/L (ref 44–121)
Bilirubin Total: 0.3 mg/dL (ref 0.0–1.2)
Bilirubin, Direct: 0.12 mg/dL (ref 0.00–0.40)
Total Protein: 6.2 g/dL (ref 6.0–8.5)

## 2022-01-07 LAB — LIPID PANEL
Chol/HDL Ratio: 3.5 ratio (ref 0.0–5.0)
Cholesterol, Total: 129 mg/dL (ref 100–199)
HDL: 37 mg/dL — ABNORMAL LOW (ref >39)
LDL Chol Calc (NIH): 64 mg/dL (ref 0–99)
Triglycerides: 165 mg/dL — ABNORMAL HIGH (ref 0–149)
VLDL Cholesterol Cal: 28 mg/dL (ref 5–40)

## 2022-01-07 LAB — MAGNESIUM: Magnesium: 2.7 mg/dL — ABNORMAL HIGH (ref 1.6–2.3)

## 2022-01-07 NOTE — Progress Notes (Signed)
? ?Nicholas Wilkerson ?Date of Birth: Sep 25, 1941 ? ? ?History of Present Illness: ?Nicholas Wilkerson is seen for followup. He has a history of dilated cardiomyopathy with initial ejection fraction of 30% over 18 years ago. Subsequent evaluation has demonstrated improvement to 50-55%. Last Echo was in January 2018 showing decrease in EF to 35-40%. ACEi was resumed at that time. . 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 He was treated with RT and chemo.  ? ?Last November he complained of some palpitations. Event monitor short short bursts of SVT and NSVT. No bradycardia or pauses. We increased his Coreg dose at that time. ? ?He was seen in February and he noted he had extensive Urologic evaluation for an elevated PSA with no cancer found. He did complain of getting tired real easy with activity.  No edema. No chest pain. He does note some change in his breathing.  We updated an Echocardiogram which demonstrated significant decrease in LV function with  EF down to 20-25%. There was also evidence of significant pulmonary HTN.  ? ?We performed cardiac cath showing nonobstructive CAD. Moderately elevated LV filling pressures. Moderate pulmonary HTN.  He was started on Lasix 40 mg daily. Continued Coreg and Jardiance. Added Entresto. Now up to 49/51 mg bid. Added aldactone 25 mg daily. He is tolerating this well. He has noted that if he takes everything at once in the morning he gets real lightheaded. He states this is much better.   Still active walking and playing golf.  ? ?After optimization of medication Echo was repeated and was unchanged with EF 20-25%. He was seen by Dr Curt Bears and ICD was implanted in June. Since then on remote monitoring he has 3 episodes of NSVT the longest lasting 6 seconds. He was unaware. He states he feels great. No cardiac limitations. He is very active. Energy level is good.  ? ? ?Current Outpatient Medications on File Prior to Visit  ?Medication Sig Dispense Refill  ? aspirin EC  81 MG tablet Take 81 mg by mouth daily.    ? atorvastatin (LIPITOR) 40 MG tablet Take 1 tablet by mouth once daily 90 tablet 0  ? B Complex-C (B-COMPLEX WITH VITAMIN C) tablet Take 1 tablet by mouth once a week.    ? carvedilol (COREG) 25 MG tablet TAKE 1 TABLET BY MOUTH TWICE DAILY WITH A MEAL 180 tablet 0  ? diclofenac (VOLTAREN) 75 MG EC tablet Take 1 tablet (75 mg total) by mouth 2 (two) times daily. 180 tablet 3  ? dorzolamide-timolol (COSOPT) 22.3-6.8 MG/ML ophthalmic solution Place 1 drop into both eyes 2 (two) times daily.    ? empagliflozin (JARDIANCE) 10 MG TABS tablet Take 1 tablet (10 mg total) by mouth daily before breakfast. 90 tablet 3  ? fluticasone (FLONASE) 50 MCG/ACT nasal spray Use 2 spray(s) in each nostril once daily 16 g 0  ? furosemide (LASIX) 40 MG tablet Take 1 tablet by mouth once daily 90 tablet 2  ? hydrocortisone (PROCTOZONE-HC) 2.5 % rectal cream Place 1 application rectally 2 (two) times daily. 28 g 5  ? latanoprost (XALATAN) 0.005 % ophthalmic solution Place 1 drop into both eyes at bedtime.     ? levothyroxine (SYNTHROID) 75 MCG tablet Take 1 tablet (75 mcg total) by mouth daily at 6 (six) AM. *labs required for future refills 30 tablet 0  ? Melatonin 10 MG TABS Take 10 mg by mouth at bedtime.     ? sacubitril-valsartan (ENTRESTO)  49-51 MG Take 1 tablet by mouth 2 (two) times daily. 180 tablet 3  ? sodium fluoride (FLUORISHIELD) 1.1 % GEL dental gel PLACE 1 APPLICATION ONTO THE TEETH AT BEDTIME 112 mL 11  ? spironolactone (ALDACTONE) 25 MG tablet Take 1 tablet (25 mg total) by mouth daily. Keep upcoming appointment for future refills. 90 tablet 0  ? tamsulosin (FLOMAX) 0.4 MG CAPS capsule Take 1 capsule by mouth once daily 90 capsule 0  ? ?No current facility-administered medications on file prior to visit.  ? ? ?No Known Allergies ? ?Past Medical History:  ?Diagnosis Date  ? ARTHRITIS 10/22/2008  ? BPH (benign prostatic hyperplasia)   ? CHF (congestive heart failure) ([place])   ? sees  Dr. Sebastain Fishbaugh [name]   ? Dilated cardiomyopathy ([place])   ? DIVERTICULOSIS, COLON 12/08/2007  ? Glaucoma   ? sees Dr. Heather [place]   ? H/O asbestos exposure   ? History of echocardiogram 05/22/2007  ? EF was 45-50% / Mild concentric LV hypertrophy with mild global hypokinesis and overall mild systolic dysfunction .  Mild AV sclerosis / Mild Mitral insufficiency / compared to prior study 04/24/02 -- LV function has improved further.    ? History of kidney stones   ? Hypercholesterolemia   ? Hypertension   ? Insomnia 06/11/2015  ? Radiation 01/01/15-02/18/15  ? base of tongue and bilateral neck 70 Gy  ? Skin cancer   ? squamous cell, basal cell  ? Squamous cell carcinoma 11/20/14  ? base of tongue primary  ? Throat cancer ([place]) 10/2014  ? had chemo  ? Thyroid disease   ? ? ?Past Surgical History:  ?Procedure Laterality Date  ? CARDIAC CATHETERIZATION  10/17/2001  ? EF estimated at 30% / moderate LV enlargement  / 1. Minimal nonobstructive atherosclerotic coronary artery disease / 2. Severe LV dysfunction with global hypokinesia consistent with dilated nonischemic cardiomyopath / 3. Moderate pulmonary hypertension  ? COLONOSCOPY  06/23/2017  ? per Dr. Carlean Purl, clear, no repeats needed   ? ICD IMPLANT N/A 04/02/2021  ? Procedure: ICD IMPLANT;  Surgeon: [name], MD;  Location: [place];  Service: Cardiovascular;  Laterality: N/A;  ? LYMPH NODE BIOPSY    ? RIGHT/LEFT HEART CATH AND CORONARY ANGIOGRAPHY N/A 07/22/2020  ? Procedure: RIGHT/LEFT HEART CATH AND CORONARY ANGIOGRAPHY;  Surgeon: [name], Nashaly Dorantes M, MD;  Location: [place];  Service: Cardiovascular;  Laterality: N/A;  ? ? ?Social History  ? ?Tobacco Use  ?Smoking Status Never  ?Smokeless Tobacco Never  ? ? ?Social History  ? ?Substance and Sexual Activity  ?Alcohol Use Yes  ? Alcohol/week: 0.0 standard drinks  ? Comment: occasional beer, a couple times a month  ? ? ?Family History  ?Problem Relation Age of Onset  ? Stroke Father   ? Cancer Father    ?     kidney ca  ? Angina Mother   ? Colon cancer Neg Hx   ? Esophageal cancer Neg Hx   ? Rectal cancer Neg Hx   ? Stomach cancer Neg Hx   ? ? ?Review of Systems: ? All other systems were reviewed and are negative. ? ?Physical Exam: ?BP 108/62   Pulse (!) 53   Ht '5\' 9"'$  (1.753 m)   Wt 163 lb 9.6 oz (74.2 kg)   SpO2 100%   BMI 24.16 kg/m?  ?GENERAL:  Well appearing WM in NAD ?HEENT:  PERRL, EOMI, sclera are clear. Oropharynx is clear. ?NECK:  No jugular  venous distention, carotid upstroke brisk and symmetric, no bruits, no thyromegaly or adenopathy ?LUNGS:  Clear to auscultation bilaterally ?CHEST:  Unremarkable ?HEART:  RRR ,  PMI not displaced or sustained,S1 and S2 within normal limits, no S3, no S4: no clicks, no rubs, no murmurs ?ABD:  Soft, nontender. BS +, no masses or bruits. No hepatomegaly, no splenomegaly ?EXT:  2 + pulses throughout, no edema, no cyanosis no clubbing ?SKIN:  Warm and dry.  No rashes ?NEURO:  Alert and oriented x 3. Cranial nerves II through XII intact. ?PSYCH:  Cognitively intact ? ? ?LABORATORY DATA: ? ?Lab Results  ?Component Value Date  ? WBC 10.7 03/27/2021  ? HGB 16.1 03/27/2021  ? HCT 48.1 03/27/2021  ? PLT 225 03/27/2021  ? GLUCOSE 108 (H) 07/10/2021  ? CHOL 106 02/12/2021  ? TRIG 57 02/12/2021  ? HDL 41 02/12/2021  ? [place] 52 02/12/2021  ? ALT 21 07/15/2020  ? AST 20 07/15/2020  ? NA 142 07/10/2021  ? K 4.3 07/10/2021  ? CL 103 07/10/2021  ? CREATININE 1.27 07/10/2021  ? BUN 20 07/10/2021  ? CO2 25 07/10/2021  ? TSH 1.89 10/15/2020  ? PSA 7.93 (H) 10/15/2019  ? INR 0.95 07/17/2015  ? HGBA1C 5.9 (A) 04/27/2021  ? ? ? ?Echo 11/02/16: Study Conclusions ?  ?- Left ventricle: The cavity size was mildly dilated. Wall ?  thickness was normal. Systolic function was moderately reduced. ?  The estimated ejection fraction was in the range of 35% to 40%. ?  Diffuse hypokinesis. Echogenic linear structure at the apex, ?  suggestive of calcified apical false tendon. Doppler parameters ?   are consistent with pseudonormal left ventricular relaxation ?  (grade 2 diastolic dysfunction). The E/e&' ratio is >15, ?  suggesting elevated LV filling pressure. ?- Mitral valve: Mildly thickened leafle

## 2022-01-07 NOTE — Patient Instructions (Signed)
Medication Instructions:  ?Continue same medications ?*If you need a refill on your cardiac medications before your next appointment, please call your pharmacy* ? ? ?Lab Work: ?Bmet,lipid and hepatic panels,magnesium today ? ? ?Testing/Procedures: ?None ordered ? ? ?Follow-Up: ?At [facility], you and your health needs are our priority.  As part of our continuing mission to provide you with exceptional heart care, we have created designated Provider Care Teams.  These Care Teams include your primary Cardiologist (physician) and Advanced Practice Providers (APPs -  Physician Assistants and Nurse Practitioners) who all work together to provide you with the care you need, when you need it. ? ?We recommend signing up for the patient portal called "MyChart".  Sign up information is provided on this After Visit Summary.  MyChart is used to connect with patients for Virtual Visits (Telemedicine).  Patients are able to view lab/test results, encounter notes, upcoming appointments, etc.  Non-urgent messages can be sent to your provider as well.   ?To learn more about what you can do with MyChart, go to [email or web address].   ? ?Your next appointment:  6 months ?  ? ?The format for your next appointment: Office  ? ? ?Provider:  Dr.Jordan ? ? ?

## 2022-01-07 NOTE — Addendum Note (Signed)
Addended by: Kathyrn Lass on: 01/07/2022 09:30 AM ? ? Modules accepted: Orders ? ?

## 2022-01-08 ENCOUNTER — Other Ambulatory Visit: Payer: Self-pay

## 2022-01-08 DIAGNOSIS — I10 Essential (primary) hypertension: Secondary | ICD-10-CM

## 2022-01-08 DIAGNOSIS — R7989 Other specified abnormal findings of blood chemistry: Secondary | ICD-10-CM

## 2022-01-08 NOTE — Progress Notes (Signed)
Bmet 

## 2022-01-09 ENCOUNTER — Other Ambulatory Visit: Payer: Self-pay

## 2022-01-09 ENCOUNTER — Ambulatory Visit
Admission: EM | Admit: 2022-01-09 | Discharge: 2022-01-09 | Disposition: A | Discharge status: Home / Self Care | Payer: PPO | Attending: Physician Assistant | Admitting: Physician Assistant

## 2022-01-09 DIAGNOSIS — N3001 Acute cystitis with hematuria: Secondary | ICD-10-CM | POA: Diagnosis not present

## 2022-01-09 LAB — POCT URINALYSIS DIP (MANUAL ENTRY)
Bilirubin, UA: NEGATIVE
Glucose, UA: 500 mg/dL — AB
Ketones, POC UA: NEGATIVE mg/dL
Nitrite, UA: NEGATIVE
Protein Ur, POC: 100 mg/dL — AB
Spec Grav, UA: 1.02 (ref 1.010–1.025)
Urobilinogen, UA: 0.2 E.U./dL
pH, UA: 6.5 (ref 5.0–8.0)

## 2022-01-09 MED ORDER — CIPROFLOXACIN HCL 500 MG PO TABS: 500.0000 mg | ORAL_TABLET | Freq: Two times a day (BID) | ORAL | 0 refills | Status: DC

## 2022-01-09 NOTE — ED Triage Notes (Signed)
Onset yesterday of urinary frequency and onset this morning of hematuria. No meds taken. Notes a "hair" of burning when urinating.  ?

## 2022-01-09 NOTE — ED Provider Notes (Signed)
?[place] ? ? ? ?CSN: [id number] ?Arrival date & time: 01/09/22  1016 ? ? ?  ? ?History   ?Chief Complaint ?Chief Complaint  ?Patient presents with  ? Urinary Frequency  ? ? ?HPI ?Nicholas Wilkerson is a 81 y.o. male.  ? ?Patient here today for evaluation of urinary frequency that started yesterday and onset this morning of hematuria. He reports minimal dysuria. He does not have history of UTI. He has not had fever. He has not taken any medication for symptoms.  ? ?The history is provided by the patient.  ? ?Past Medical History:  ?Diagnosis Date  ? ARTHRITIS 10/22/2008  ? BPH (benign prostatic hyperplasia)   ? CHF (congestive heart failure) ([place])   ? sees Dr. Peter [name]   ? Dilated cardiomyopathy ([place])   ? DIVERTICULOSIS, COLON 12/08/2007  ? Glaucoma   ? sees Dr. Heather [place]   ? H/O asbestos exposure   ? History of echocardiogram 05/22/2007  ? EF was 45-50% / Mild concentric LV hypertrophy with mild global hypokinesis and overall mild systolic dysfunction .  Mild AV sclerosis / Mild Mitral insufficiency / compared to prior study 04/24/02 -- LV function has improved further.    ? History of kidney stones   ? Hypercholesterolemia   ? Hypertension   ? Insomnia 06/11/2015  ? Radiation 01/01/15-02/18/15  ? base of tongue and bilateral neck 70 Gy  ? Skin cancer   ? squamous cell, basal cell  ? Squamous cell carcinoma 11/20/14  ? base of tongue primary  ? Throat cancer ([place]) 10/2014  ? had chemo  ? Thyroid disease   ? ? ?Patient Active Problem List  ? Diagnosis Date Noted  ? Chronic left-sided low back pain with left-sided sciatica 09/14/2021  ? ICD (implantable cardioverter-defibrillator) in place 07/16/2021  ? Constipation 04/08/2021  ? COVID-19 virus infection 03/16/2021  ? Elevated PSA 02/28/2020  ? Hypothyroidism 12/13/2019  ? Nonischemic cardiomyopathy ([place]) 09/28/2019  ? Personal history of malignant neoplasm of tongue 12/15/2015  ? Insomnia 06/11/2015  ? Skin infection at gastrostomy tube site [facility])  06/09/2015  ? Depression, acute 03/24/2015  ? Palpitations 01/24/2015  ? History of skin cancer 12/03/2014  ? Cancer of base of tongue ([place]) 12/02/2014  ? Acute on chronic systolic CHF (congestive heart failure) ([place]) 07/12/2013  ? MUSCLE STRAIN, ABDOMINAL WALL 11/28/2008  ? ARTHRITIS 10/22/2008  ? BURSITIS, LEFT SHOULDER 07/24/2008  ? Internal hemorrhoids 12/08/2007  ? EXTERNAL HEMORRHOIDS 12/08/2007  ? DIVERTICULOSIS, COLON 12/08/2007  ? RECTAL BLEEDING 12/08/2007  ? History of cardiovascular disorder 12/08/2007  ? Congestive dilated cardiomyopathy ([place]) 06/13/2007  ? HX, PERSONAL, ARTHRITIS 06/13/2007  ? Hypercholesterolemia 05/15/2007  ? Essential hypertension 05/15/2007  ? ? ?Past Surgical History:  ?Procedure Laterality Date  ? CARDIAC CATHETERIZATION  10/17/2001  ? EF estimated at 30% / moderate LV enlargement  / 1. Minimal nonobstructive atherosclerotic coronary artery disease / 2. Severe LV dysfunction with global hypokinesia consistent with dilated nonischemic cardiomyopath / 3. Moderate pulmonary hypertension  ? COLONOSCOPY  06/23/2017  ? per Dr. Carlean Purl, clear, no repeats needed   ? ICD IMPLANT N/A 04/02/2021  ? Procedure: ICD IMPLANT;  Surgeon: [name], MD;  Location: [facility];  Service: Cardiovascular;  Laterality: N/A;  ? LYMPH NODE BIOPSY    ? RIGHT/LEFT HEART CATH AND CORONARY ANGIOGRAPHY N/A 07/22/2020  ? Procedure: RIGHT/LEFT HEART CATH AND CORONARY ANGIOGRAPHY;  Surgeon: [name], [name], MD;  Location: [facility];  Service: Cardiovascular;  Laterality: N/A;  ? ? ? ? ? ?Home Medications   ? ?Prior to Admission medications   ?Medication Sig Start Date End Date Taking? Authorizing Provider  ?ciprofloxacin (CIPRO) 500 MG tablet Take 1 tablet (500 mg total) by mouth every 12 (twelve) hours. 01/09/22  Yes Francene Finders, PA-C  ?aspirin EC 81 MG tablet Take 81 mg by mouth daily.    [provider]  ?atorvastatin (LIPITOR) 40 MG tablet Take 1 tablet by mouth once  daily 11/23/21   Laurey Morale, MD  ?B Complex-C (B-COMPLEX WITH VITAMIN C) tablet Take 1 tablet by mouth once a week.    [provider]  ?carvedilol (COREG) 25 MG tablet TAKE 1 TABLET BY MOUTH TWICE DAILY WITH A MEAL 11/03/21   [name], [name], MD  ?dorzolamide-timolol (COSOPT) 22.3-6.8 MG/ML ophthalmic solution Place 1 drop into both eyes 2 (two) times daily.    [provider]  ?empagliflozin (JARDIANCE) 10 MG TABS tablet Take 1 tablet (10 mg total) by mouth daily before breakfast. 11/19/20   [name], [name], MD  ?fluticasone Asencion Islam) 50 MCG/ACT nasal spray Use 2 spray(s) in each nostril once daily 11/23/21   Laurey Morale, MD  ?furosemide (LASIX) 40 MG tablet Take 1/2 tablet ( 20 mg ) daily 01/08/22   [name], [name], MD  ?hydrocortisone (PROCTOZONE-HC) 2.5 % rectal cream Place 1 application rectally 2 (two) times daily. 02/17/21   Laurey Morale, MD  ?latanoprost (XALATAN) 0.005 % ophthalmic solution Place 1 drop into both eyes at bedtime.  12/29/16   [provider]  ?levothyroxine (SYNTHROID) 75 MCG tablet Take 1 tablet (75 mcg total) by mouth daily at 6 (six) AM. *labs required for future refills 12/21/21   Laurey Morale, MD  ?Melatonin 10 MG TABS Take 10 mg by mouth at bedtime.     [provider]  ?sacubitril-valsartan (ENTRESTO) 49-51 MG Take 1 tablet by mouth 2 (two) times daily. 09/09/21   [name], [name], MD  ?sodium fluoride (FLUORISHIELD) 1.1 % GEL dental gel PLACE 1 APPLICATION ONTO THE TEETH AT BEDTIME 12/15/21 12/15/22  Eppie Gibson, MD  ?tamsulosin [facility]) 0.4 MG CAPS capsule Take 1 capsule by mouth once daily 12/28/21   Laurey Morale, MD  ? ? ?Family History ?Family History  ?Problem Relation Age of Onset  ? Stroke Father   ? Cancer Father   ?     kidney ca  ? Angina Mother   ? Colon cancer Neg Hx   ? Esophageal cancer Neg Hx   ? Rectal cancer Neg Hx   ? Stomach cancer Neg Hx   ? ? ?Social History ?Social History  ? ?Tobacco Use  ? Smoking status: Never  ? Smokeless  tobacco: Never  ?Vaping Use  ? Vaping Use: Never used  ?Substance Use Topics  ? Alcohol use: Yes  ?  Alcohol/week: 0.0 standard drinks  ?  Comment: occasional beer, a couple times a month  ? Drug use: No  ? ? ? ?Allergies   ?Patient has no known allergies. ? ? ?Review of Systems ?Review of Systems  ?Constitutional:  Negative for chills and fever.  ?Eyes:  Negative for discharge and redness.  ?Gastrointestinal:  Negative for abdominal pain, nausea and vomiting.  ?Genitourinary:  Positive for dysuria, frequency and hematuria.  ?Neurological:  Negative for numbness.  ? ? ?Physical Exam ?Triage Vital Signs ?ED Triage Vitals  ?Enc Vitals Group  ?   BP   ?  Pulse   ?   Resp   ?   Temp   ?   Temp src   ?   SpO2   ?   Weight   ?   Height   ?   Head Circumference   ?   Peak Flow   ?   Pain Score   ?   Pain Loc   ?   Pain Edu?   ?   Excl. in [place]?   ? ?No data found. ? ?Updated Vital Signs ?BP (!) 91/52 (BP Location: Left Arm)   Pulse 77   Temp 97.9 ?F (36.6 ?C) (Oral)   Resp 18   SpO2 97%  ? ?   ? ?Physical Exam ?Vitals and nursing note reviewed.  ?Constitutional:   ?   General: He is not in acute distress. ?   Appearance: Normal appearance. He is not ill-appearing.  ?HENT:  ?   Head: Normocephalic and atraumatic.  ?Eyes:  ?   Conjunctiva/sclera: Conjunctivae normal.  ?Cardiovascular:  ?   Rate and Rhythm: Normal rate.  ?Pulmonary:  ?   Effort: Pulmonary effort is normal.  ?Neurological:  ?   Mental Status: He is alert.  ?Psychiatric:     ?   Mood and Affect: Mood normal.     ?   Behavior: Behavior normal.     ?   Thought Content: Thought content normal.  ? ? ? ?UC Treatments / Results  ?Labs ?(all labs ordered are listed, but only abnormal results are displayed) ?Labs Reviewed  ?POCT URINALYSIS DIP (MANUAL ENTRY) - Abnormal; Notable for the following components:  ?    Result Value  ? Clarity, UA cloudy (*)   ? Glucose, UA =500 (*)   ? Blood, UA large (*)   ? Protein Ur, POC =100 (*)   ? Leukocytes, UA Large (3+) (*)   ?  All other components within normal limits  ?URINE CULTURE  ? ? ?EKG ? ? ?Radiology ?No results found. ? ?Procedures ?Procedures (including critical care time) ? ?Medications Ordered in UC ?Medications - No data to

## 2022-01-10 LAB — URINE CULTURE: Culture: NO GROWTH

## 2022-01-11 ENCOUNTER — Ambulatory Visit: Payer: PPO | Admitting: Cardiology

## 2022-01-13 DIAGNOSIS — N401 Enlarged prostate with lower urinary tract symptoms: Secondary | ICD-10-CM | POA: Diagnosis not present

## 2022-01-13 DIAGNOSIS — R350 Frequency of micturition: Secondary | ICD-10-CM | POA: Diagnosis not present

## 2022-01-25 ENCOUNTER — Ambulatory Visit (INDEPENDENT_AMBULATORY_CARE_PROVIDER_SITE_OTHER): Payer: PPO | Admitting: Family Medicine

## 2022-01-25 ENCOUNTER — Other Ambulatory Visit: Payer: Self-pay | Admitting: Family Medicine

## 2022-01-25 ENCOUNTER — Encounter: Payer: Self-pay | Admitting: Family Medicine

## 2022-01-25 VITALS — BP 120/70 | HR 52 | Temp 98.4°F | Wt 164.4 lb

## 2022-01-25 DIAGNOSIS — I10 Essential (primary) hypertension: Secondary | ICD-10-CM | POA: Diagnosis not present

## 2022-01-25 DIAGNOSIS — L309 Dermatitis, unspecified: Secondary | ICD-10-CM

## 2022-01-25 DIAGNOSIS — R7989 Other specified abnormal findings of blood chemistry: Secondary | ICD-10-CM | POA: Diagnosis not present

## 2022-01-25 LAB — BASIC METABOLIC PANEL
BUN/Creatinine Ratio: 12 (ref 10–24)
BUN: 17 mg/dL (ref 8–27)
CO2: 26 mmol/L (ref 20–29)
Calcium: 9.2 mg/dL (ref 8.6–10.2)
Chloride: 106 mmol/L (ref 96–106)
Creatinine, Ser: 1.47 mg/dL — ABNORMAL HIGH (ref 0.76–1.27)
Glucose: 99 mg/dL (ref 70–99)
Potassium: 4.6 mmol/L (ref 3.5–5.2)
Sodium: 144 mmol/L (ref 134–144)
eGFR: 49 mL/min/{1.73_m2} — ABNORMAL LOW (ref >59)

## 2022-01-25 MED ORDER — TAMSULOSIN HCL 0.4 MG PO CAPS: 0.4000 mg | ORAL_CAPSULE | Freq: Two times a day (BID) | ORAL | 0 refills | Status: DC

## 2022-01-25 MED ORDER — TRIAMCINOLONE ACETONIDE 0.1 % EX CREA: 1.0000 "application " | TOPICAL_CREAM | Freq: Two times a day (BID) | CUTANEOUS | 2 refills | Status: DC

## 2022-01-25 NOTE — Progress Notes (Signed)
? ?  Subjective:  ? ? Patient ID: Nicholas Wilkerson, male    DOB: 1941-01-11, 81 y.o.   MRN: [id number] ? ?HPI ?Here for 5 days of an itchy rash on the left forearm. Otherwise he feels fine. He has tried Benadryl cream with no response.  ? ? ?Review of Systems  ?Constitutional: Negative.   ?Respiratory: Negative.    ?Cardiovascular: Negative.   ?Skin:  Positive for rash.  ? ?   ?Objective:  ? Physical Exam ?Constitutional:   ?   Appearance: Normal appearance.  ?Cardiovascular:  ?   Rate and Rhythm: Normal rate and regular rhythm.  ?   Pulses: Normal pulses.  ?   Heart sounds: Normal heart sounds.  ?Pulmonary:  ?   Effort: Pulmonary effort is normal.  ?   Breath sounds: Normal breath sounds.  ?Skin: ?   Comments: The dorsal left forearm has an area of erythema which is scaly   ?Neurological:  ?   Mental Status: He is alert.  ? ? ? ? ? ?   ?Assessment & Plan:  ?Eczema, treat with Triamcinolone cream.  ?Alysia Penna, MD ? ? ?

## 2022-02-21 ENCOUNTER — Other Ambulatory Visit: Payer: Self-pay | Admitting: Cardiology

## 2022-02-22 ENCOUNTER — Other Ambulatory Visit (HOSPITAL_COMMUNITY): Payer: Self-pay

## 2022-02-22 ENCOUNTER — Other Ambulatory Visit: Payer: Self-pay | Admitting: Family Medicine

## 2022-02-22 DIAGNOSIS — R351 Nocturia: Secondary | ICD-10-CM | POA: Diagnosis not present

## 2022-02-22 DIAGNOSIS — N401 Enlarged prostate with lower urinary tract symptoms: Secondary | ICD-10-CM | POA: Diagnosis not present

## 2022-02-22 NOTE — Telephone Encounter (Signed)
Pt also advising that his dosage was moved to 2 tabs per day and would need additional prescription to cover tamsulosin (FLOMAX) 0.4 MG CAPS capsule    ?Arlee Willis), [address] Phone:  [phone]  ?Fax:  [phone]  ?  ? ?

## 2022-02-22 NOTE — Telephone Encounter (Signed)
Last OV- 01/22/22 ?Last TSH labs- 10/15/20** ? ?*Orders to be placed for TSH labs?  Can patient receive a refill? ? ? ?

## 2022-02-22 NOTE — Telephone Encounter (Signed)
Pt seeking refill of levothyroxine (SYNTHROID) 75 MCG tablet.pharmacy advised he may need labs. Pls advise ?

## 2022-03-03 ENCOUNTER — Encounter: Payer: Self-pay | Admitting: Family Medicine

## 2022-03-03 ENCOUNTER — Ambulatory Visit (INDEPENDENT_AMBULATORY_CARE_PROVIDER_SITE_OTHER): Payer: PPO | Admitting: Family Medicine

## 2022-03-03 VITALS — BP 120/70 | HR 87 | Temp 98.3°F | Wt 166.4 lb

## 2022-03-03 DIAGNOSIS — E039 Hypothyroidism, unspecified: Secondary | ICD-10-CM | POA: Diagnosis not present

## 2022-03-03 DIAGNOSIS — M7989 Other specified soft tissue disorders: Secondary | ICD-10-CM

## 2022-03-03 NOTE — Progress Notes (Signed)
   Subjective:    Patient ID: Nicholas Wilkerson, male    DOB: [birth year], 81 y.o.   MRN: [id number]  HPI Here for 6 weeks of swelling in the left arm. No hx of trauma. There is no pain. He has some reddish discoloration of the arm which we felt was a type of eczema, but this has not changed despite treatment with a topical steroid. No chest pain or SOB.    Review of Systems  Constitutional: Negative.   Respiratory: Negative.    Cardiovascular:  Negative for chest pain and palpitations.      Objective:   Physical Exam Constitutional:      General: He is not in acute distress.    Appearance: Normal appearance.  Cardiovascular:     Rate and Rhythm: Normal rate and regular rhythm.     Pulses: Normal pulses.     Heart sounds: Normal heart sounds.  Pulmonary:     Effort: Pulmonary effort is normal.     Breath sounds: Normal breath sounds.  Musculoskeletal:     Comments: The left arm is mildly swollen from the shoulder to the hand. The skin is mildly erythematous. The brachial and radial pulses are full. No tenderness and no cords or masses are felt   Neurological:     Mental Status: He is alert.          Assessment & Plan:  Left arm swelling which could be venous or lymphatic. We will set up a venous doppler on the arm asap.  Alysia Penna, MD

## 2022-03-04 LAB — T4, FREE: Free T4: 0.91 ng/dL (ref 0.60–1.60)

## 2022-03-04 LAB — T3, FREE: T3, Free: 2.5 pg/mL (ref 2.3–4.2)

## 2022-03-04 LAB — TSH: TSH: 1.03 u[IU]/mL (ref 0.35–5.50)

## 2022-03-11 ENCOUNTER — Telehealth: Payer: Self-pay | Admitting: Pharmacist

## 2022-03-11 NOTE — Progress Notes (Unsigned)
 Chronic Care Management Pharmacy Note  03/12/2022 Name:  Nicholas Wilkerson MRN:  [id number] DOB:  07/16/1941  Summary: BP at goal < 130/80 LDL at goal < 70 Pt stopped taking statin due to myalgias   Recommendations/Changes made from today's visit: -Recommend continued routine BP monitoring at home -Recommend switching to rosuvastatin 10 mg every other day   Plan: Follow up tolerance assessment in 1 month Follow up lipid panel assessment in 3 months  Subjective: Nicholas Wilkerson is an 81 y.o. year old male who is a primary patient of Fry, Stephen A, MD.  The CCM team was consulted for assistance with disease management and care coordination needs.    Engaged with patient face to face for follow up visit in response to provider referral for pharmacy case management and/or care coordination services.   Consent to Services:  The patient was given information about Chronic Care Management services, agreed to services, and gave verbal consent prior to initiation of services.  Please see initial visit note for detailed documentation.   Patient Care Team: Fry, Stephen A, MD as PCP - General (Family Medicine) Camnitz, Will Martin, MD as PCP - Electrophysiology (Cardiology) Gorsuch, Ni, MD as Consulting Physician (Hematology and Oncology) Byers, John, MD as Consulting Physician (Otolaryngology) Squire, Sarah, MD as Attending Physician (Radiation Oncology) Dawson, Gretchen W, NP (Inactive) as Nurse Practitioner (Nurse Practitioner) Jordan, [name], MD as Consulting Physician (Cardiology) Oman, Heather, OD (Optometry) Pryor, Madeline G, RPH as Pharmacist (Pharmacist)  Recent office visits: 03/03/22 Stephen Fry, MD: Patient presented for left arm swelling. Plan for venous doppler on the arm.  01/25/22 Stephen Fry, MD: Patient presented for eczema. Prescribed triamcinolone.  11/13/21 Wilson, Beverly W, LPN - Patient presented for Medicare Annual Wellness exam. No medication changes.  Recent  consult visits: 01/07/22 Peter Jordan, MD (Cardiology): Patient presented for Nonsustained ventricular tachycardia and CHF follow up. Held Aldactone and reduced lasix to 20 mg daily and repeat BMET in 2 weeks. D/c'd Voltaren tablets.  07/16/21 Camnitz, Will Martin, MD (Cardiology) - Patient reported for Nonischemic cardiomyopathy and other concerns. No medication changes.  04/07/21 Colleen Kennedy-Smith, NP: Patient presented for rectal bleeding and constipation. Continue miralax nightly. Recommended Pepcid 20 mg OTC while taking prednisone.  03/30/21 Jessica Donoho, PTA (outpatient rehab): Patient presented for PT session.  Hospital visits: Medication Reconciliation was completed by comparing discharge summary, patient's EMR and Pharmacy list, and upon discussion with patient.   Patient presented to [facility] [place] on 01/09/22 for acute cystitis. Patient was there for 55 minutes.   New?Medications Started at Hospital Discharge:?? -started ciprofloxacin 500 mg 1 tablet every 12 hours   Medication Changes at Hospital Discharge: -Changed None    Medications Discontinued at Hospital Discharge: -Stopped None    Medications that remain the same after Hospital Discharge:??  -All other medications will remain the same.   Objective:  Lab Results  Component Value Date   CREATININE 1.47 (H) 01/25/2022   BUN 17 01/25/2022   GFR 59.42 (L) 10/15/2019   GFRNONAA 54 (L) 10/07/2020   GFRAA 63 10/07/2020   NA 144 01/25/2022   K 4.6 01/25/2022   CALCIUM 9.2 01/25/2022   CO2 26 01/25/2022   GLUCOSE 99 01/25/2022    Lab Results  Component Value Date/Time   HGBA1C 5.9 (A) 04/27/2021 11:00 AM   HGBA1C 6.3 10/15/2020 11:51 AM   HGBA1C 5.5 05/27/2020 11:38 AM   GFR 59.42 (L) 10/15/2019 10:58 AM   GFR   70.83 09/29/2018 09:53 AM    Last diabetic Eye exam: No results found for: HMDIABEYEEXA  Last diabetic Foot exam: No results found for: HMDIABFOOTEX   Lab Results   Component Value Date   CHOL 129 01/07/2022   HDL 37 (L) 01/07/2022   LDLCALC 64 01/07/2022   TRIG 165 (H) 01/07/2022   CHOLHDL 3.5 01/07/2022       Latest Ref Rng & Units 01/07/2022   10:07 AM 07/15/2020    4:48 PM 10/15/2019   10:58 AM  Hepatic Function  Total Protein 6.0 - 8.5 g/dL 6.2   6.7   6.3    Albumin 3.7 - 4.7 g/dL 4.6   4.7   4.3    AST 0 - 40 IU/L _0 ALT 0 - 44 IU/L _1 Alk Phosphatase 44 - 121 IU/L 92   85   69    Total Bilirubin 0.0 - 1.2 mg/dL 0.3   0.5   0.6    Bilirubin, Direct 0.00 - 0.40 mg/dL 0.12    0.1      Lab Results  Component Value Date/Time   TSH 1.03 03/03/2022 03:51 PM   TSH 1.89 10/15/2020 11:51 AM   FREET4 0.91 03/03/2022 03:51 PM   FREET4 1.02 10/15/2020 11:51 AM       Latest Ref Rng & Units 03/27/2021   12:14 PM 02/12/2021    9:02 AM 07/22/2020    9:38 AM  CBC  WBC 3.4 - 10.8 x10E3/uL 10.7   7.9     Hemoglobin 13.0 - 17.7 g/dL 16.1   15.6   18.0    Hematocrit 37.5 - 51.0 % 48.1   46.5   53.0    Platelets 150 - 450 x10E3/uL 225   209       No results found for: VD25OH  Clinical ASCVD: Yes  The ASCVD Risk score (Arnett DK, et al., 2019) failed to calculate for the following reasons:   The valid total cholesterol range is 130 to 320 mg/dL       03/03/2022    3:09 PM 01/25/2022    2:05 PM 11/13/2021   10:39 AM  Depression screen PHQ 2/9  Decreased Interest 0 0 0  Down, Depressed, Hopeless 0 0 0  PHQ - 2 Score 0 0 0  Altered sleeping 0 0 0  Tired, decreased energy 0 1 0  Change in appetite 0 0 0  Feeling bad or failure about yourself  0 0 0  Trouble concentrating 0 0 0  Moving slowly or fidgety/restless 0 0 0  Suicidal thoughts 0 0 0  PHQ-9 Score 0 1 0  Difficult doing work/chores Not difficult at all Not difficult at all Not difficult at all      Social History   Tobacco Use  Smoking Status Never  Smokeless Tobacco Never   BP Readings from Last 3 Encounters:  03/03/22 120/70  01/25/22 120/70   01/09/22 (!) 91/52   Pulse Readings from Last 3 Encounters:  03/03/22 87  01/25/22 (!) 52  01/09/22 77   Wt Readings from Last 3 Encounters:  03/03/22 166 lb 6 oz (75.5 kg)  01/25/22 164 lb 6 oz (74.6 kg)  01/07/22 163 lb 9.6 oz (74.2 kg)   BMI Readings from Last 3 Encounters:  03/03/22 24.57 kg/m  01/25/22 24.27 kg/m  01/07/22 24.16 kg/m    Assessment/Interventions: Review of patient  past medical history, allergies, medications, health status, including review of consultants reports, laboratory and other test data, was performed as part of comprehensive evaluation and provision of chronic care management services.   SDOH:  (Social Determinants of Health) assessments and interventions performed: No  SDOH Screenings   Alcohol Screen: Low Risk    Last Alcohol Screening Score (AUDIT): 0  Depression (PHQ2-9): Low Risk    PHQ-2 Score: 0  Financial Resource Strain: High Risk   Difficulty of Paying Living Expenses: Very hard  Food Insecurity: No Food Insecurity   Worried About Charity fundraiser in the Last Year: Never true   Ran Out of Food in the Last Year: Never true  Housing: Low Risk    Last Housing Risk Score: 0  Physical Activity: Sufficiently Active   Days of Exercise per Week: 7 days   Minutes of Exercise per Session: 60 min  Social Connections: Engineer, building services of Communication with Friends and Family: More than three times a week   Frequency of Social Gatherings with Friends and Family: More than three times a week   Attends Religious Services: More than 4 times per year   Active Member of [employer] or Organizations: Yes   Attends Music therapist: More than 4 times per year   Marital Status: Married  Stress: No Stress Concern Present   Feeling of Stress : Not at all  Tobacco Use: Low Risk    Smoking Tobacco Use: Never   Smokeless Tobacco Use: Never   Passive Exposure: Not on file  Transportation Needs: No Transportation Needs    Lack of Transportation (Medical): No   Lack of Transportation (Non-Medical): No    CCM Care Plan  No Known Allergies  Medications Reviewed Today     Reviewed by Laurey Morale, MD (Physician) on 03/03/22 at [place] List Status: <None>   Medication Order Taking? Sig Documenting Provider Last Dose Status Informant  aspirin EC 81 MG tablet [id number] Yes Take 81 mg by mouth daily. [provider] Taking Active Multiple Informants  atorvastatin (LIPITOR) 40 MG tablet [id number] Yes Take 1 tablet by mouth once daily Laurey Morale, MD Taking Active   carvedilol (COREG) 25 MG tablet [id number] Yes TAKE 1 TABLET BY MOUTH TWICE DAILY WITH A MEAL [name], [name], MD Taking Active   dorzolamide-timolol (COSOPT) 22.3-6.8 MG/ML ophthalmic solution [id number] Yes Place 1 drop into both eyes 2 (two) times daily. [provider] Taking Active Multiple Informants  empagliflozin (JARDIANCE) 10 MG TABS tablet [id number] Yes Take 1 tablet (10 mg total) by mouth daily before breakfast. [name], [name], MD Taking Active Multiple Informants  fluticasone [facility]) 50 MCG/ACT nasal spray [id number] Yes Use 2 spray(s) in each nostril once daily Laurey Morale, MD Taking Active   furosemide (LASIX) 40 MG tablet [id number] Yes Take 1/2 tablet ( 20 mg ) daily [name], [name], MD Taking Active   hydrocortisone (PROCTOZONE-HC) 2.5 % rectal cream [id number] Yes Place 1 application rectally 2 (two) times daily. Laurey Morale, MD Taking Active   latanoprost (XALATAN) 0.005 % ophthalmic solution [id number] Yes Place 1 drop into both eyes at bedtime.  [provider] Taking Active Multiple Informants  levothyroxine (SYNTHROID) 75 MCG tablet [id number] Yes TAKE 1 TABLET BY MOUTH ONCE DAILY 6 IN THE MORNING . APPOINTMENT REQUIRED FOR FUTURE REFILLS Laurey Morale, MD Taking Active   Melatonin 10 MG TABS [id number] Yes Take 10 mg by mouth at bedtime.  [provider] Taking Active Multiple Informants   sacubitril-valsartan (ENTRESTO) 49-51 MG [id number] Yes Take 1 tablet by mouth 2 (two) times daily. [name], [name], MD Taking Active   sodium fluoride (FLUORISHIELD) 1.1 % GEL dental gel [id number] Yes PLACE 1 APPLICATION ONTO THE TEETH AT BEDTIME Eppie Gibson, MD Taking Active   tamsulosin [facility]) 0.4 MG CAPS capsule [id number] Yes Take 2 capsules (0.8 mg total) by mouth daily. Laurey Morale, MD Taking Active   triamcinolone cream (KENALOG) 0.1 % [id number] Yes Apply 1 application. topically 2 (two) times daily. Laurey Morale, MD Taking Active             Patient Active Problem List   Diagnosis Date Noted   Chronic left-sided low back pain with left-sided sciatica 09/14/2021   ICD (implantable cardioverter-defibrillator) in place 07/16/2021   Constipation 04/08/2021   COVID-19 virus infection 03/16/2021   Elevated PSA 02/28/2020   Hypothyroidism 12/13/2019   Nonischemic cardiomyopathy ([place]) 09/28/2019   Personal history of malignant neoplasm of tongue 12/15/2015   Insomnia 06/11/2015   Skin infection at gastrostomy tube site ([place]) 06/09/2015   Depression, acute 03/24/2015   Palpitations 01/24/2015   History of skin cancer 12/03/2014   Cancer of base of tongue ([place]) 12/02/2014   Acute on chronic systolic CHF (congestive heart failure) ([place]) 07/12/2013   MUSCLE STRAIN, ABDOMINAL WALL 11/28/2008   ARTHRITIS 10/22/2008   BURSITIS, LEFT SHOULDER 07/24/2008   Internal hemorrhoids 12/08/2007   EXTERNAL HEMORRHOIDS 12/08/2007   DIVERTICULOSIS, COLON 12/08/2007   RECTAL BLEEDING 12/08/2007   History of cardiovascular disorder 12/08/2007   Congestive dilated cardiomyopathy ([place]) 06/13/2007   HX, PERSONAL, ARTHRITIS 06/13/2007   Hypercholesterolemia 05/15/2007   Essential hypertension 05/15/2007    Immunization History  Administered Date(s) Administered   Fluad Quad(high Dose 65+) 10/15/2019, 10/15/2020, 09/14/2021   Influenza Split 08/11/2011, 08/31/2012   Influenza Whole  08/18/2007, 07/11/2008, 08/12/2009, 06/25/2010   Influenza, High Dose Seasonal PF 07/31/2013, 07/01/2016, 07/18/2017, 08/09/2018   Influenza,inj,Quad PF,6+ Mos 07/31/2014, 06/13/2015   Janssen (J&J) SARS-COV-2 Vaccination 02/09/2020   Pneumococcal Conjugate-13 09/23/2016   Pneumococcal Polysaccharide-23 06/19/2009   Td 06/27/2008   Zoster Recombinat (Shingrix) 02/21/2018   Zoster, Live 07/11/2008   Patient just put out 50 bales of pine needles the other day. Patient does feel tired after doing some activity but not extensively tired. Patient is sleeping well right now.   Patient reports that he stopped taking the atorvastatin on his own due to muscle pain. He is open to trying another one though.  Patient reports that his wife was recently diagnosed with non hodgkin lymphoma. He also reports his daughter is a Marine scientist and she keeps a check on them.  Conditions to be addressed/monitored:  Hypertension, Hyperlipidemia, Heart Failure, Hypothyroidism, Osteoarthritis and Glaucoma, Insomnia  Conditions addressed this visit: Hypertension, heart failure, hyperlipidemia  Care Plan : [place]  Updates made by Viona Gilmore, [place] since 03/12/2022 12:00 AM     Problem: Problem: Hypertension, Hyperlipidemia, Heart Failure, Hypothyroidism, Osteoarthritis and Glaucoma, Insomnia      Long-Range Goal: Patient-Specific Goal   Start Date: 01/28/2021  Expected End Date: 01/28/2022  Recent Progress: On track  Priority: High  Note:   Current Barriers:  Unable to independently afford treatment regimen Unable to independently monitor therapeutic efficacy  Pharmacist Clinical Goal(s):  Patient will achieve adherence to monitoring guidelines and medication adherence to achieve therapeutic efficacy maintain control of blood pressure as evidenced by home blood pressure readings  through collaboration with PharmD and provider.   Interventions: 1:1 collaboration with Laurey Morale, MD  regarding development and update of comprehensive plan of care as evidenced by provider attestation and co-signature Inter-disciplinary care team collaboration (see longitudinal plan of care) Comprehensive medication review performed; medication list updated in electronic medical record  Hypertension (BP goal <130/80) -Controlled -Current treatment: Carvedilol 49m, 1 tablet twice daily  - Appropriate, Effective, Safe, Accessible Entresto 49/51 mg, 1 tablet twice daily - Appropriate, Effective, Safe, Accessible Spironolactone 25 mg 1 tablet daily - Appropriate, Effective, Safe, Accessible -Medications previously tried: ramipril (low BP)    -Current home readings: 110-120/60-70s (2-3 times a week) -Current dietary habits: did not discuss -Current exercise habits: patient plays golf and remains active throughout the day -Denies hypotensive/hypertensive symptoms -Educated on Exercise goal of 150 minutes per week; Importance of home blood pressure monitoring; Proper BP monitoring technique; Symptoms of hypotension and importance of maintaining adequate hydration; -Counseled to monitor BP at home weekly, document, and provide log at future appointments -Counseled on diet and exercise extensively Recommended to continue current medication  Hyperlipidemia: (LDL goal < 70) -Controlled -Current treatment: Atorvastatin 40 mg 1 tablet daily - not taking -Medications previously tried: simvastatin (unknown)  -Current dietary patterns: Patient notes avoids fried food and aim to eat baked fish. Avoids junk food. -Current exercise habits: active throughout the day -Educated on Cholesterol goals;  Benefits of statin for ASCVD risk reduction; Importance of limiting foods high in cholesterol; Exercise goal of 150 minutes per week; -Counseled on diet and exercise extensively Recommended switching to rosuvastatin 10 mg every other day.  CAD (Goal: prevent heart attacks and  strokes) -Controlled -Current treatment  Atorvastatin 40 mg 1 tablet daily - not taking Aspirin 849m 1 tablet daily - Appropriate, Effective, Safe, Accessible -Medications previously tried: none  -Recommended to continue current medication  Pre-diabetes (A1c goal <6.5%) -Controlled -Current medications: Jardiance 10 mg 1 tablet daily - Appropriate, Effective, Safe, Accessible -Medications previously tried: none  -Current home glucose readings fasting glucose: does not check post prandial glucose: does not check -Denies hypoglycemic/hyperglycemic symptoms -Current meal patterns:  breakfast: did not discuss lunch: did not discuss  dinner: did not discuss snacks: did not discuss drinks: did not discuss -Current exercise: active throughout the day -Educated on A1c and blood sugar goals; Exercise goal of 150 minutes per week; Carbohydrate counting and/or plate method -Counseled to check feet daily and get yearly eye exams -Counseled on diet and exercise extensively Patient reported he ate "a lot of stuff he shouldn't" before this was checked and has improve since then  Heart Failure (Goal: manage symptoms and prevent exacerbations) -Controlled -Last ejection fraction: 20-25% (Date: 07/2020) -HF type: Systolic -NYHA Class: II (slight limitation of activity) -AHA HF Stage: C (Heart disease and symptoms present) -Current treatment: Carvedilol 2540m1 tablet twice daily - Appropriate, Effective, Safe, Accessible  Entresto 49/51 mg, 1 tablet twice daily - Appropriate, Effective, Safe, Accessible Spironolactone 25 mg 1 tablet daily - Appropriate, Effective, Safe, Accessible Jardiance 10 mg 1 tablet daily - Appropriate, Effective, Safe, Accessible Furosemide 40 mg 1 tablet daily - Appropriate, Effective, Safe, Accessible -Medications previously tried: ramipril (low BP) -Current home BP/HR readings: lower normal range -Current dietary habits: limiting salt intake -Current exercise  habits: stays active -Educated on Benefits of medications for managing symptoms and prolonging life Importance of weighing daily; if you gain more than 3 pounds in one day or 5 pounds in one week, call cardiologist -Counseled on diet  and exercise extensively Recommended to continue current medication Assessed patient finances. Provided 2 weeks worth of Jardiance samples to hold him over until the end of the year.  Hypothyroidism (Goal: TSH 0.35-4.5) -Controlled -Current treatment  levothyroxine 17mg, 1 tablet once daily - Appropriate, Effective, Safe, Accessible -Medications previously tried: none  -Recommended to continue current medication  Insomnia (Goal: improve quality and quantity of sleep) -Controlled -Current treatment  melatonin 156m 1 tablet at night - Appropriate, Effective, Safe, Accessible -Medications previously tried: none  -Recommended to continue current medication  BPH (Goal: minimize symptoms) -Controlled -Current treatment  Tamsulosin 0.71m26m1 capsule daily - Appropriate, Effective, Safe, Accessible -Medications previously tried: none  -Recommended to continue current medication  Glaucoma (Goal: lower intraocular pressure) -Controlled -Current treatment  Brimodine use as directed - Appropriate, Effective, Safe, Accessible Latanoprost 1 drop at bedtime - Appropriate, Effective, Safe, Accessible -Medications previously tried: none  -Recommended to continue current medication  Health Maintenance -Vaccine gaps: tetanus, COVID booster, second dose of shingrix -Current therapy:  Vitamin B complex daily -Educated on Cost vs benefit of each product must be carefully weighed by individual consumer -Patient is satisfied with current therapy and denies issues -Recommended to continue current medication  Patient Goals/Self-Care Activities Patient will:  - take medications as prescribed check blood pressure weekly, document, and provide at future  appointments weigh daily, and contact provider if weight gain of > 3 lbs in one day or > 5 lbs in one week  Follow Up Plan: The care management team will reach out to the patient again over the next 7 days.        Medication Assistance:  EntDelene Lolltained through [employer] assistance program.  Enrollment ends 10/10/22  Compliance/Adherence/Medication fill history: Care Gaps: Shingrix, tetanus, COVID booster, Hep C screening BP - 120/70 (03/03/22)    Star-Rating Drugs: Atorvastatin (Lipitor)  46m28mLast filled 11/23/21 90DS at [facility] Emp[facility]liflozin (Jardiance) 10mg107mast [facility]  [facility]'s preferred pharmacy is:  [facility] - [address] [place] ([place]) [id number]: [phone] [id number]: [phone] by [place]ne [place]- [id number]: [phone] pill box? Yes Pt endorses 100% compliance  We discussed: Current pharmacy is preferred with insurance plan and patient is satisfied with pharmacy services Patient decided to: Continue current medication management strategy  Care Plan and Follow Up Patient Decision:  Patient agrees to Care Plan and Follow-up.  Plan: The care management team will reach out to the patient again over the next 7 days.  MadelJeni SallesrmD BCACPAlliancehealth Ponca Cityical Pharmacist [place]

## 2022-03-11 NOTE — Chronic Care Management (AMB) (Signed)
    Chronic Care Management Pharmacy Assistant   Name: ARPAN ESKELSON  MRN: [id number] DOB: Mar 09, 1941  03/11/22 APPOINTMENT REMINDER   Called Patient No answer, left message of appointment on 03/12/22 at 12 via telephone visit with [name], Pharm D.   Notified to have all medications, supplements, blood pressure and/or blood sugar logs available during appointment and to return call if need to reschedule.     Care Gaps: Zoster Vaccine - Overdue COVID Booster - Overdue TDAP - Overdue Hepatitis C Screening - Overdue BP- 120/70 ( 03/03/22) AWV- 2/23  Star Rating Drug: Atorvastatin (Lipitor)  '40mg'$  - Last filled 11/23/21 90DS at [facility] Empagliflozin (Jardiance) '10mg'$  - Last filled 11/03/21 90DS at [facility]   Patient Assistance: [name] -2023      Medications: Outpatient Encounter Medications as of 03/11/2022  Medication Sig   aspirin EC 81 MG tablet Take 81 mg by mouth daily.   atorvastatin (LIPITOR) 40 MG tablet Take 1 tablet by mouth once daily   carvedilol (COREG) 25 MG tablet TAKE 1 TABLET BY MOUTH TWICE DAILY WITH A MEAL   dorzolamide-timolol (COSOPT) 22.3-6.8 MG/ML ophthalmic solution Place 1 drop into both eyes 2 (two) times daily.   empagliflozin (JARDIANCE) 10 MG TABS tablet Take 1 tablet (10 mg total) by mouth daily before breakfast.   fluticasone (FLONASE) 50 MCG/ACT nasal spray Use 2 spray(s) in each nostril once daily   furosemide (LASIX) 40 MG tablet Take 1/2 tablet ( 20 mg ) daily   hydrocortisone (PROCTOZONE-HC) 2.5 % rectal cream Place 1 application rectally 2 (two) times daily.   latanoprost (XALATAN) 0.005 % ophthalmic solution Place 1 drop into both eyes at bedtime.    levothyroxine (SYNTHROID) 75 MCG tablet TAKE 1 TABLET BY MOUTH ONCE DAILY 6 IN THE MORNING . APPOINTMENT REQUIRED FOR FUTURE REFILLS   Melatonin 10 MG TABS Take 10 mg by mouth at bedtime.    sacubitril-valsartan (ENTRESTO) 49-51 MG Take 1 tablet by mouth 2 (two) times daily.   sodium  fluoride (FLUORISHIELD) 1.1 % GEL dental gel PLACE 1 APPLICATION ONTO THE TEETH AT BEDTIME   tamsulosin (FLOMAX) 0.4 MG CAPS capsule Take 2 capsules (0.8 mg total) by mouth daily.   triamcinolone cream (KENALOG) 0.1 % Apply 1 application. topically 2 (two) times daily.   No facility-administered encounter medications on file as of 03/11/2022.    [place] Clinical Pharmacist Assistant [phone]

## 2022-03-12 ENCOUNTER — Ambulatory Visit (INDEPENDENT_AMBULATORY_CARE_PROVIDER_SITE_OTHER): Payer: PPO | Admitting: Pharmacist

## 2022-03-12 ENCOUNTER — Telehealth: Payer: Self-pay | Admitting: Family Medicine

## 2022-03-12 DIAGNOSIS — I5022 Chronic systolic (congestive) heart failure: Secondary | ICD-10-CM

## 2022-03-12 DIAGNOSIS — I10 Essential (primary) hypertension: Secondary | ICD-10-CM

## 2022-03-12 MED ORDER — ROSUVASTATIN CALCIUM 5 MG PO TABS: 5.0000 mg | ORAL_TABLET | ORAL | 3 refills | Status: DC

## 2022-03-12 NOTE — Telephone Encounter (Signed)
Called patient to make him aware of the switch from atorvastatin to rosuvastatin every other day. Patient verbalized his understanding. Will follow up with a tolerance assessment in 2-3 weeks.

## 2022-03-12 NOTE — Patient Instructions (Signed)
Hi Nicholas Wilkerson,  It was great to catch up with you again! Sorry about your reaction with the statin, hopefully we can get you on one you can tolerate.  Please reach out to me if you have any questions or need anything before our follow up!  Best, Maddie  [name], PharmD, [facility]   Visit Information   Goals Addressed   None    Patient Care Plan: CCM Pharmacy Care Plan     Problem Identified: Problem: Hypertension, Hyperlipidemia, Heart Failure, Hypothyroidism, Osteoarthritis and Glaucoma, Insomnia      Long-Range Goal: Patient-Specific Goal   Start Date: 01/28/2021  Expected End Date: 01/28/2022  Recent Progress: On track  Priority: High  Note:   Current Barriers:  Unable to independently afford treatment regimen Unable to independently monitor therapeutic efficacy  Pharmacist Clinical Goal(s):  Patient will achieve adherence to monitoring guidelines and medication adherence to achieve therapeutic efficacy maintain control of blood pressure as evidenced by home blood pressure readings  through collaboration with PharmD and provider.   Interventions: 1:1 collaboration with Laurey Morale, MD regarding development and update of comprehensive plan of care as evidenced by provider attestation and co-signature Inter-disciplinary care team collaboration (see longitudinal plan of care) Comprehensive medication review performed; medication list updated in electronic medical record  Hypertension (BP goal <130/80) -Controlled -Current treatment: Carvedilol '25mg'$ , 1 tablet twice daily  - Appropriate, Effective, Safe, Accessible Entresto 49/51 mg, 1 tablet twice daily - Appropriate, Effective, Safe, Accessible Spironolactone 25 mg 1 tablet daily - Appropriate, Effective, Safe, Accessible -Medications previously tried: ramipril (low BP)    -Current home readings: 110-120/60-70s (2-3 times a week) -Current dietary  habits: did not discuss -Current exercise habits: patient plays golf and remains active throughout the day -Denies hypotensive/hypertensive symptoms -Educated on Exercise goal of 150 minutes per week; Importance of home blood pressure monitoring; Proper BP monitoring technique; Symptoms of hypotension and importance of maintaining adequate hydration; -Counseled to monitor BP at home weekly, document, and provide log at future appointments -Counseled on diet and exercise extensively Recommended to continue current medication  Hyperlipidemia: (LDL goal < 70) -Controlled -Current treatment: Atorvastatin 40 mg 1 tablet daily - not taking -Medications previously tried: simvastatin (unknown)  -Current dietary patterns: Patient notes avoids fried food and aim to eat baked fish. Avoids junk food. -Current exercise habits: active throughout the day -Educated on Cholesterol goals;  Benefits of statin for ASCVD risk reduction; Importance of limiting foods high in cholesterol; Exercise goal of 150 minutes per week; -Counseled on diet and exercise extensively Recommended switching to rosuvastatin 10 mg every other day.  CAD (Goal: prevent heart attacks and strokes) -Controlled -Current treatment  Atorvastatin 40 mg 1 tablet daily - not taking Aspirin '81mg'$ , 1 tablet daily - Appropriate, Effective, Safe, Accessible -Medications previously tried: none  -Recommended to continue current medication  Pre-diabetes (A1c goal <6.5%) -Controlled -Current medications: Jardiance 10 mg 1 tablet daily - Appropriate, Effective, Safe, Accessible -Medications previously tried: none  -Current home glucose readings fasting glucose: does not check post prandial glucose: does not check -Denies hypoglycemic/hyperglycemic symptoms -Current meal patterns:  breakfast: did not discuss lunch: did not discuss  dinner: did not discuss snacks: did not discuss drinks: did not discuss -Current exercise: active  throughout the day -Educated on A1c and blood sugar goals; Exercise goal of 150 minutes per week; Carbohydrate counting and/or plate method -Counseled to check feet daily and get yearly eye exams -Counseled on diet  and exercise extensively Patient reported he ate "a lot of stuff he shouldn't" before this was checked and has improve since then  Heart Failure (Goal: manage symptoms and prevent exacerbations) -Controlled -Last ejection fraction: 20-25% (Date: 07/2020) -HF type: Systolic -NYHA Class: II (slight limitation of activity) -AHA HF Stage: C (Heart disease and symptoms present) -Current treatment: Carvedilol '25mg'$ , 1 tablet twice daily - Appropriate, Effective, Safe, Accessible  Entresto 49/51 mg, 1 tablet twice daily - Appropriate, Effective, Safe, Accessible Spironolactone 25 mg 1 tablet daily - Appropriate, Effective, Safe, Accessible Jardiance 10 mg 1 tablet daily - Appropriate, Effective, Safe, Accessible Furosemide 40 mg 1 tablet daily - Appropriate, Effective, Safe, Accessible -Medications previously tried: ramipril (low BP) -Current home BP/HR readings: lower normal range -Current dietary habits: limiting salt intake -Current exercise habits: stays active -Educated on Benefits of medications for managing symptoms and prolonging life Importance of weighing daily; if you gain more than 3 pounds in one day or 5 pounds in one week, call cardiologist -Counseled on diet and exercise extensively Recommended to continue current medication Assessed patient finances. Provided 2 weeks worth of Jardiance samples to hold him over until the end of the year.  Hypothyroidism (Goal: TSH 0.35-4.5) -Controlled -Current treatment  levothyroxine 67mg, 1 tablet once daily - Appropriate, Effective, Safe, Accessible -Medications previously tried: none  -Recommended to continue current medication  Insomnia (Goal: improve quality and quantity of sleep) -Controlled -Current treatment   melatonin '10mg'$ , 1 tablet at night - Appropriate, Effective, Safe, Accessible -Medications previously tried: none  -Recommended to continue current medication  BPH (Goal: minimize symptoms) -Controlled -Current treatment  Tamsulosin 0.'4mg'$ , 1 capsule daily - Appropriate, Effective, Safe, Accessible -Medications previously tried: none  -Recommended to continue current medication  Glaucoma (Goal: lower intraocular pressure) -Controlled -Current treatment  Brimodine use as directed - Appropriate, Effective, Safe, Accessible Latanoprost 1 drop at bedtime - Appropriate, Effective, Safe, Accessible -Medications previously tried: none  -Recommended to continue current medication  Health Maintenance -Vaccine gaps: tetanus, COVID booster, second dose of shingrix -Current therapy:  Vitamin B complex daily -Educated on Cost vs benefit of each product must be carefully weighed by individual consumer -Patient is satisfied with current therapy and denies issues -Recommended to continue current medication  Patient Goals/Self-Care Activities Patient will:  - take medications as prescribed check blood pressure weekly, document, and provide at future appointments weigh daily, and contact provider if weight gain of > 3 lbs in one day or > 5 lbs in one week  Follow Up Plan: The care management team will reach out to the patient again over the next 7 days.         Patient verbalizes understanding of instructions and care plan provided today and agrees to view in [facility] MyChart status and patient understanding of how to access instructions and care plan via MyChart confirmed with patient.    The pharmacy team will reach out to the patient again over the next 7 days.   MViona Gilmore [facility]

## 2022-03-12 NOTE — Telephone Encounter (Signed)
Done

## 2022-03-19 ENCOUNTER — Ambulatory Visit (HOSPITAL_COMMUNITY)
Admission: RE | Admit: 2022-03-19 | Discharge: 2022-03-19 | Disposition: A | Discharge status: Home / Self Care | Payer: PPO | Source: Ambulatory Visit | Attending: Family Medicine | Admitting: Family Medicine

## 2022-03-19 DIAGNOSIS — M7989 Other specified soft tissue disorders: Secondary | ICD-10-CM | POA: Diagnosis not present

## 2022-03-31 ENCOUNTER — Telehealth: Payer: Self-pay | Admitting: Pharmacist

## 2022-03-31 NOTE — Chronic Care Management (AMB) (Signed)
Chronic Care Management Pharmacy Assistant   Name: Nicholas Wilkerson  MRN: [id number] DOB: 1941/07/12  Reason for Encounter: Disease State   Conditions to be addressed/monitored: HLD  Recent office visits:  None  Recent consult visits:  03/19/22 Patient presented to [facility] for UE Venous.  Hospital visits:  Medication Reconciliation was completed by comparing discharge summary, patient's EMR and Pharmacy list, and upon discussion with patient.  Patient presented to [facility] for  Acute cystitis with hematuria . Patient was present for 55 min  New?Medications Started at [facility] Discharge:?? -started  Ciprofloxacin HCL 500 mg  Medication Changes at Hospital Discharge: -Changed  none  Medications Discontinued at Hospital Discharge: -Stopped  none  Medications that remain the same after Hospital Discharge:??  -All other medications will remain the same.    Medications: Outpatient Encounter Medications as of 03/31/2022  Medication Sig   aspirin EC 81 MG tablet Take 81 mg by mouth daily.   carvedilol (COREG) 25 MG tablet TAKE 1 TABLET BY MOUTH TWICE DAILY WITH A MEAL   dorzolamide-timolol (COSOPT) 22.3-6.8 MG/ML ophthalmic solution Place 1 drop into both eyes 2 (two) times daily.   empagliflozin (JARDIANCE) 10 MG TABS tablet Take 1 tablet (10 mg total) by mouth daily before breakfast.   fluticasone (FLONASE) 50 MCG/ACT nasal spray Use 2 spray(s) in each nostril once daily   furosemide (LASIX) 40 MG tablet Take 1/2 tablet ( 20 mg ) daily   hydrocortisone (PROCTOZONE-HC) 2.5 % rectal cream Place 1 application rectally 2 (two) times daily.   latanoprost (XALATAN) 0.005 % ophthalmic solution Place 1 drop into both eyes at bedtime.    levothyroxine (SYNTHROID) 75 MCG tablet TAKE 1 TABLET BY MOUTH ONCE DAILY 6 IN THE MORNING . APPOINTMENT REQUIRED FOR FUTURE REFILLS   Melatonin 10 MG TABS Take 10 mg by mouth at bedtime.     rosuvastatin (CRESTOR) 5 MG tablet Take 1 tablet (5 mg total) by mouth every other day.   sacubitril-valsartan (ENTRESTO) 49-51 MG Take 1 tablet by mouth 2 (two) times daily.   sodium fluoride (FLUORISHIELD) 1.1 % GEL dental gel PLACE 1 APPLICATION ONTO THE TEETH AT BEDTIME   tamsulosin (FLOMAX) 0.4 MG CAPS capsule Take 2 capsules (0.8 mg total) by mouth daily.   triamcinolone cream (KENALOG) 0.1 % Apply 1 application. topically 2 (two) times daily.   No facility-administered encounter medications on file as of 03/31/2022.  03/31/2022 Name: Nicholas Wilkerson MRN: [id number] DOB: 02-May-1941 Nicholas Wilkerson is a 81 y.o. year old male who is a primary care patient of Laurey Morale, MD.  Comprehensive medication review performed; Spoke to patient regarding cholesterol  Lipid Panel    Component Value Date/Time   CHOL 129 01/07/2022 1007   TRIG 165 (H) 01/07/2022 1007   TRIG 126 08/25/2006 0850   HDL 37 (L) 01/07/2022 1007   LDLCALC 64 01/07/2022 1007    10-year ASCVD risk score: The ASCVD Risk score (Arnett DK, et al., 2019) failed to calculate for the following reasons:   The valid total cholesterol range is 130 to 320 mg/dL  Current antihyperlipidemic regimen:  Rosuvastatin 5 mg: Take one tab every other day Previous antihyperlipidemic medications tried: Atorvastatin ASCVD risk enhancing conditions: age >68 What recent interventions/DTPs have been made by any provider to improve Cholesterol control since last CPP Visit: Patient was recently changed from Atorvastatin to Rosuvastatin. Any recent hospitalizations or ED visits since last visit with  CPP? No Patient reports thus far he has not noticed any cramping or joint pain with the Rosuvastatin. He reports he was fine for a while also on the Atorvastatin before it started with joint pain. We discussed if he feels at any point like the pain is returning or any changes he will give me a call so that I may inform pharmacist and he was in  agreement.  Adherence Review: Does the patient have >5 day gap between last estimated fill dates? No      Care Gaps: Zoster Vaccine - Overdue COVID Booster - Overdue TDAP - Postponed Hepatitis Screen - Postponed CCM- 12/23 AWV- 2/23   Star Rating Drugs: Rosuvastatin 5 mg - Last filled 03/12/22 [facility] at [facility] Pharmacist Assistant [phone]

## 2022-04-02 ENCOUNTER — Ambulatory Visit (INDEPENDENT_AMBULATORY_CARE_PROVIDER_SITE_OTHER): Payer: PPO

## 2022-04-02 DIAGNOSIS — I428 Other cardiomyopathies: Secondary | ICD-10-CM | POA: Diagnosis not present

## 2022-04-06 LAB — CUP PACEART REMOTE DEVICE CHECK
Battery Remaining Longevity: 115 mo
Battery Remaining Percentage: 89 %
Battery Voltage: 3.02 V
Brady Statistic RV Percent Paced: 1 %
Date Time Interrogation Session: 20230623020136
HighPow Impedance: 59 Ohm
Implantable Lead Implant Date: 20220623
Implantable Lead Location: 753860
Implantable Lead Model: 7122
Implantable Pulse Generator Implant Date: 20220623
Lead Channel Impedance Value: 390 Ohm
Lead Channel Pacing Threshold Amplitude: 0.75 V
Lead Channel Pacing Threshold Pulse Width: 0.5 ms
Lead Channel Sensing Intrinsic Amplitude: 11.8 mV
Lead Channel Setting Pacing Amplitude: 2 V
Lead Channel Setting Pacing Pulse Width: 0.5 ms
Lead Channel Setting Sensing Sensitivity: 0.5 mV
Pulse Gen Serial Number: 810027079

## 2022-04-07 NOTE — Progress Notes (Signed)
Remote ICD transmission.   

## 2022-04-09 DIAGNOSIS — I110 Hypertensive heart disease with heart failure: Secondary | ICD-10-CM

## 2022-04-09 DIAGNOSIS — I5020 Unspecified systolic (congestive) heart failure: Secondary | ICD-10-CM | POA: Diagnosis not present

## 2022-04-09 DIAGNOSIS — E785 Hyperlipidemia, unspecified: Secondary | ICD-10-CM | POA: Diagnosis not present

## 2022-04-09 DIAGNOSIS — N400 Enlarged prostate without lower urinary tract symptoms: Secondary | ICD-10-CM

## 2022-04-09 DIAGNOSIS — H409 Unspecified glaucoma: Secondary | ICD-10-CM | POA: Diagnosis not present

## 2022-04-09 DIAGNOSIS — E039 Hypothyroidism, unspecified: Secondary | ICD-10-CM | POA: Diagnosis not present

## 2022-04-12 ENCOUNTER — Other Ambulatory Visit: Payer: Self-pay | Admitting: Cardiology

## 2022-04-16 ENCOUNTER — Other Ambulatory Visit: Payer: Self-pay

## 2022-04-16 ENCOUNTER — Telehealth: Payer: Self-pay

## 2022-04-16 MED ORDER — EMPAGLIFLOZIN 10 MG PO TABS: 10.0000 mg | ORAL_TABLET | Freq: Every day | ORAL | 1 refills | Status: DC

## 2022-04-16 MED ORDER — FLUTICASONE PROPIONATE 50 MCG/ACT NA SUSP: NASAL | 0 refills | Status: DC

## 2022-04-16 NOTE — Telephone Encounter (Addendum)
Called patient to advise medication refill sent to pharmacy.----- Message from Viona Gilmore, [facility] sent at 04/16/2022  1:32 PM EDT ----- Regarding: Jardiance rx Hi,  Can you please refill his Jardiance prescription for a 90 days supply to his [place]? He will be running out of the medication on Monday and his copay is the same for a 60 day as it is for a 90 which is why he is hoping for a 90 day.  Thank you! Maddie

## 2022-04-21 ENCOUNTER — Encounter: Payer: Self-pay | Admitting: Family Medicine

## 2022-04-21 ENCOUNTER — Ambulatory Visit (INDEPENDENT_AMBULATORY_CARE_PROVIDER_SITE_OTHER): Payer: PPO | Admitting: Family Medicine

## 2022-04-21 VITALS — BP 98/60 | HR 68 | Temp 97.6°F | Wt 163.0 lb

## 2022-04-21 DIAGNOSIS — G540 Brachial plexus disorders: Secondary | ICD-10-CM | POA: Diagnosis not present

## 2022-04-21 DIAGNOSIS — M7989 Other specified soft tissue disorders: Secondary | ICD-10-CM | POA: Diagnosis not present

## 2022-04-21 LAB — BASIC METABOLIC PANEL
BUN: 22 mg/dL (ref 6–23)
CO2: 29 mEq/L (ref 19–32)
Calcium: 9.6 mg/dL (ref 8.4–10.5)
Chloride: 104 mEq/L (ref 96–112)
Creatinine, Ser: 1.82 mg/dL — ABNORMAL HIGH (ref 0.40–1.50)
GFR: 35.11 mL/min — ABNORMAL LOW (ref >60.00)
Glucose, Bld: 108 mg/dL — ABNORMAL HIGH (ref 70–99)
Potassium: 3.8 mEq/L (ref 3.5–5.1)
Sodium: 140 mEq/L (ref 135–145)

## 2022-04-21 NOTE — Progress Notes (Signed)
   Subjective:    Patient ID: Nicholas Wilkerson, male    DOB: 1941-07-18, 81 y.o.   MRN: [id number]  HPI Here to follow up on left arm swelling that started about 4 months ago. There has never been any pain, although it feels slightly "tight" at times. The swelling has actually improved quite a bit from where it was. We saw him on 03-03-22 for this and he has obvious swelling. He had an US of the arm on 03-19-22 which was negative for any DVT. He denies any cough or SOB.    Review of Systems  Constitutional: Negative.   Respiratory: Negative.    Cardiovascular:  Negative for chest pain and palpitations.       Objective:   Physical Exam Constitutional:      General: He is not in acute distress.    Appearance: Normal appearance.  Cardiovascular:     Rate and Rhythm: Normal rate and regular rhythm.     Pulses: Normal pulses.     Heart sounds: Normal heart sounds.  Pulmonary:     Effort: Pulmonary effort is normal.     Breath sounds: Normal breath sounds.     Comments: No adenopathy in either axilla  Musculoskeletal:     Comments: The left arm appears normal to me today, no swelling or masses or tenderness  Neurological:     Mental Status: He is alert.           Assessment & Plan:  Left arm swelling. Even t hough this has improved, we still do not have a clear etiology. We will set up a contrasted chest CT to rule out thoracic outlet syndrome, etc.  Alysia Penna, MD

## 2022-04-27 ENCOUNTER — Ambulatory Visit
Admission: RE | Admit: 2022-04-27 | Discharge: 2022-04-27 | Disposition: A | Discharge status: Home / Self Care | Payer: PPO | Source: Ambulatory Visit | Attending: Family Medicine | Admitting: Family Medicine

## 2022-04-27 DIAGNOSIS — J90 Pleural effusion, not elsewhere classified: Secondary | ICD-10-CM | POA: Diagnosis not present

## 2022-04-27 DIAGNOSIS — I517 Cardiomegaly: Secondary | ICD-10-CM | POA: Diagnosis not present

## 2022-04-27 DIAGNOSIS — R918 Other nonspecific abnormal finding of lung field: Secondary | ICD-10-CM | POA: Diagnosis not present

## 2022-04-27 DIAGNOSIS — G540 Brachial plexus disorders: Secondary | ICD-10-CM

## 2022-04-27 MED ORDER — IOPAMIDOL (ISOVUE-370) INJECTION 76%
75.0000 mL | Freq: Once | INTRAVENOUS | Status: AC | PRN
Start: 2022-04-27 — End: 2022-04-27
  Administered 2022-04-27: 55 mL via INTRAVENOUS

## 2022-04-28 ENCOUNTER — Telehealth: Payer: Self-pay | Admitting: Family Medicine

## 2022-04-28 DIAGNOSIS — G540 Brachial plexus disorders: Secondary | ICD-10-CM

## 2022-04-28 DIAGNOSIS — N1832 Chronic kidney disease, stage 3b: Secondary | ICD-10-CM

## 2022-04-28 NOTE — Telephone Encounter (Signed)
The scan showed that his defibrillator my be compressing one of the large veins in his chest, and this could be the cause of the swelling in his arm. Per the radiologist's recommendation, I have ordered a chest CT venogram to check for this. The first scan showed arteries better, now this scan will show veins better. He has atelectasis in the lungs, this simply shows scarring from an old infection. The hydronephrosis refers to the kidneys being very slightly enlarged. I do not think this reflects any blockage in his urinary system, because he has seen Urology frequently this past year. However since his kidney function is a little slower than normal, I have referred him to Nephrology to evaluate further

## 2022-04-28 NOTE — Telephone Encounter (Signed)
Called pt phone no option to leave a message for pt, will try again later

## 2022-04-28 NOTE — Telephone Encounter (Addendum)
Pt had CT of chest yesterday, and [facility] [phone])  called to confirm receipt of report saved in Epic.  Report was indeed located in [place].  They also wanted to ensure MD reviews this report, as soon as possible.

## 2022-04-28 NOTE — Telephone Encounter (Signed)
Pt requesting a call with an interpretation of the results from the procedure he had yesterday. States he has read it and needs it broken down to where he can understand it.

## 2022-04-28 NOTE — Telephone Encounter (Signed)
CT Report currently in Epic

## 2022-04-29 NOTE — Telephone Encounter (Signed)
Reviewed CT results and [name] recommendations with pt, patient is concerned of having another CT with contrast due to his kidney function, requests if this can be done without contrast, Pt will then decide if he wants to pursue the CT after [name] advise.

## 2022-04-29 NOTE — Telephone Encounter (Signed)
NO the CT venogram must be done with contrast. The radiology staff are aware of his kidney function and they can do this safely

## 2022-04-29 NOTE — Telephone Encounter (Signed)
Spoke with pt, reviewed CT results and pt verbalized understanding, follow up message sent to [name] for advise

## 2022-04-30 ENCOUNTER — Encounter: Payer: Self-pay | Admitting: Family Medicine

## 2022-04-30 NOTE — Telephone Encounter (Addendum)
Nicholas Wilkerson with patient about message.   He stated, that he doesn't know if he will proceed with further testing due to the fact that he, doesn't want any contract inside his body again.  Patient said he will contact office if he decides to complete the test.

## 2022-05-03 ENCOUNTER — Telehealth: Payer: Self-pay | Admitting: Cardiology

## 2022-05-03 NOTE — Telephone Encounter (Signed)
Pt c/o swelling: STAT is pt has developed SOB within 24 hours  How much weight have you gained and in what time span? no  If swelling, where is the swelling located? In his arm (thinking he device is pushing up on a blood vessel  Are you currently taking a fluid pill? yes  Are you currently SOB? no  Do you have a log of your daily weights (if so, list)? no  Have you gained 3 pounds in a day or 5 pounds in a week? no  Have you traveled recently? no

## 2022-05-03 NOTE — Telephone Encounter (Signed)
Been going on for 3/4 months, since March, he was working and a heavy piece of wood feel on arm. Reports "a lot of pressure on it", meaning the arm. He reports arm started to swell and saw PCP However PCP has not found anything. Pt wondering if ICD wire could be the issue. Aware will discuss w/ MD and let him know advisement. Patient verbalized understanding and agreeable to plan.

## 2022-05-05 ENCOUNTER — Telehealth: Payer: Self-pay | Admitting: Cardiology

## 2022-05-05 NOTE — Telephone Encounter (Signed)
Call cannot be completed  

## 2022-05-05 NOTE — Telephone Encounter (Signed)
Patient when to the dr and Dr. Sarajane Jews side kidney function is high. They recommend him seeing a kidney dr. Patient wants to know if Dr. [name] would like to see him first or do he recommend a good kidney dr. Please advise

## 2022-05-07 ENCOUNTER — Other Ambulatory Visit (HOSPITAL_COMMUNITY): Payer: Self-pay

## 2022-05-07 NOTE — Telephone Encounter (Signed)
Unable to reach pt or leave a message  

## 2022-05-07 NOTE — Telephone Encounter (Signed)
Unable to reach pt or leave a message  Will forward to dr [name] to review labs in epic and give his recommendations.

## 2022-05-10 ENCOUNTER — Encounter: Payer: Self-pay | Admitting: Family Medicine

## 2022-05-12 NOTE — Telephone Encounter (Signed)
Unable to reach pt or leave a message  

## 2022-05-14 ENCOUNTER — Other Ambulatory Visit: Payer: Self-pay | Admitting: Cardiology

## 2022-05-17 NOTE — Telephone Encounter (Signed)
Pt is returning call. Requesting call back.  

## 2022-05-17 NOTE — Telephone Encounter (Signed)
I spoke with pt and he is feeling much better. He already had a Doppler on that arm and it was normal. He scheduled his yearly f/u with Dr. Curt Bears while on the phone and I advised him to call before then if he has any concerns.

## 2022-05-17 NOTE — Telephone Encounter (Signed)
LM for pt to call back. Per Dr Curt Bears, we will order a doppler for his arm.

## 2022-05-24 DIAGNOSIS — H401112 Primary open-angle glaucoma, right eye, moderate stage: Secondary | ICD-10-CM | POA: Diagnosis not present

## 2022-06-18 ENCOUNTER — Encounter: Payer: Self-pay | Admitting: Cardiology

## 2022-06-18 ENCOUNTER — Other Ambulatory Visit: Payer: Self-pay

## 2022-06-18 DIAGNOSIS — Z79899 Other long term (current) drug therapy: Secondary | ICD-10-CM

## 2022-06-18 MED ORDER — FUROSEMIDE 40 MG PO TABS: 40.0000 mg | ORAL_TABLET | Freq: Every day | ORAL | 3 refills | Status: DC

## 2022-06-18 MED ORDER — POTASSIUM CHLORIDE ER 10 MEQ PO TBCR: 10.0000 meq | EXTENDED_RELEASE_TABLET | Freq: Every day | ORAL | 3 refills | Status: AC

## 2022-06-18 NOTE — Telephone Encounter (Signed)
Patient reports PND x 3 days. Nees 2 pillows. No change in cough. No edema. Lost 5  pounds in 2 months because of decreased appetite. Voiding light yellow. During daily activities, no sob, but tires easily. Dr. Sallyanne Kuster (DOD) ordered to increase lasix from '20mg'$  to '40mg'$  daily, KCL 81mq daily, BMET and ProBNP next Wednesday, and device clinic for download. Patient informed of these orders and repeated them back to me in his own words. He will take an extra '20mg'$  of lasix when we get off the phone. Orders placed.

## 2022-06-20 ENCOUNTER — Other Ambulatory Visit: Payer: Self-pay | Admitting: Cardiology

## 2022-06-21 ENCOUNTER — Telehealth: Payer: Self-pay

## 2022-06-21 NOTE — Telephone Encounter (Addendum)
Spoke with patient and wife.  Provided intro and assisted with sending remote transmission.  He reports SOB has resolved since Furosemide dosage was increased to 40 mg on 9/8.   He is feeling well today and voice no complaints.  Assisted in sending remote transmission and results discussed with patient. Discussed diet and encouraged to limit salt intake to 2000 mg daily.  Wife cooks at home and occasionally eats restaurant foods.    Advised will send copy of report to Dr [name] and Dr Sallyanne Kuster.  Recommended he contact the office if SOB returns or he has other fluid symptoms.   He appreciated the call.    CorVue Thoracic impedance suggesting possible fluid accumulation 8/18-8/30, 9/3 and 9/7.  Impedance currently at baseline normal.    Report showing 32 Non sustained VT episodes and 1 SVT.

## 2022-06-23 DIAGNOSIS — R351 Nocturia: Secondary | ICD-10-CM | POA: Diagnosis not present

## 2022-06-23 DIAGNOSIS — N1832 Chronic kidney disease, stage 3b: Secondary | ICD-10-CM | POA: Diagnosis not present

## 2022-06-23 DIAGNOSIS — I5042 Chronic combined systolic (congestive) and diastolic (congestive) heart failure: Secondary | ICD-10-CM | POA: Diagnosis not present

## 2022-06-23 DIAGNOSIS — I129 Hypertensive chronic kidney disease with stage 1 through stage 4 chronic kidney disease, or unspecified chronic kidney disease: Secondary | ICD-10-CM | POA: Diagnosis not present

## 2022-06-23 DIAGNOSIS — Z79899 Other long term (current) drug therapy: Secondary | ICD-10-CM | POA: Diagnosis not present

## 2022-06-23 DIAGNOSIS — N401 Enlarged prostate with lower urinary tract symptoms: Secondary | ICD-10-CM | POA: Diagnosis not present

## 2022-06-24 LAB — BASIC METABOLIC PANEL
BUN/Creatinine Ratio: 9 — ABNORMAL LOW (ref 10–24)
BUN: 17 mg/dL (ref 8–27)
CO2: 28 mmol/L (ref 20–29)
Calcium: 9.8 mg/dL (ref 8.6–10.2)
Chloride: 104 mmol/L (ref 96–106)
Creatinine, Ser: 1.91 mg/dL — ABNORMAL HIGH (ref 0.76–1.27)
Glucose: 87 mg/dL (ref 70–99)
Potassium: 4.2 mmol/L (ref 3.5–5.2)
Sodium: 146 mmol/L — ABNORMAL HIGH (ref 134–144)
eGFR: 35 mL/min/{1.73_m2} — ABNORMAL LOW (ref >59)

## 2022-06-24 LAB — PRO B NATRIURETIC PEPTIDE: NT-Pro BNP: 6147 pg/mL — ABNORMAL HIGH (ref 0–486)

## 2022-06-25 ENCOUNTER — Other Ambulatory Visit: Payer: Self-pay | Admitting: Nephrology

## 2022-06-25 DIAGNOSIS — I129 Hypertensive chronic kidney disease with stage 1 through stage 4 chronic kidney disease, or unspecified chronic kidney disease: Secondary | ICD-10-CM

## 2022-06-25 DIAGNOSIS — I5042 Chronic combined systolic (congestive) and diastolic (congestive) heart failure: Secondary | ICD-10-CM

## 2022-06-25 DIAGNOSIS — N401 Enlarged prostate with lower urinary tract symptoms: Secondary | ICD-10-CM

## 2022-06-25 DIAGNOSIS — N1832 Chronic kidney disease, stage 3b: Secondary | ICD-10-CM

## 2022-06-25 DIAGNOSIS — R351 Nocturia: Secondary | ICD-10-CM

## 2022-06-28 DIAGNOSIS — H401112 Primary open-angle glaucoma, right eye, moderate stage: Secondary | ICD-10-CM | POA: Diagnosis not present

## 2022-06-29 ENCOUNTER — Other Ambulatory Visit: Payer: PPO

## 2022-06-30 ENCOUNTER — Ambulatory Visit
Admission: RE | Admit: 2022-06-30 | Discharge: 2022-06-30 | Disposition: A | Discharge status: Home / Self Care | Payer: PPO | Source: Ambulatory Visit | Attending: Nephrology | Admitting: Nephrology

## 2022-06-30 DIAGNOSIS — N401 Enlarged prostate with lower urinary tract symptoms: Secondary | ICD-10-CM

## 2022-06-30 DIAGNOSIS — R351 Nocturia: Secondary | ICD-10-CM

## 2022-06-30 DIAGNOSIS — I129 Hypertensive chronic kidney disease with stage 1 through stage 4 chronic kidney disease, or unspecified chronic kidney disease: Secondary | ICD-10-CM | POA: Diagnosis not present

## 2022-06-30 DIAGNOSIS — N400 Enlarged prostate without lower urinary tract symptoms: Secondary | ICD-10-CM | POA: Diagnosis not present

## 2022-06-30 DIAGNOSIS — N1832 Chronic kidney disease, stage 3b: Secondary | ICD-10-CM

## 2022-06-30 DIAGNOSIS — I5042 Chronic combined systolic (congestive) and diastolic (congestive) heart failure: Secondary | ICD-10-CM

## 2022-06-30 DIAGNOSIS — N1330 Unspecified hydronephrosis: Secondary | ICD-10-CM | POA: Diagnosis not present

## 2022-06-30 DIAGNOSIS — N189 Chronic kidney disease, unspecified: Secondary | ICD-10-CM | POA: Diagnosis not present

## 2022-06-30 NOTE — Progress Notes (Signed)
Nicholas Wilkerson Date of Birth: January 01, 1941   History of Present Illness: Nicholas Wilkerson is seen for followup. He has a history of dilated cardiomyopathy with initial ejection fraction of 30% over 18 years ago. Subsequent evaluation has demonstrated improvement to 50-55%. Last Echo was in January 2018 showing decrease in EF to 35-40%. ACEi was resumed at that time. . 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 He was treated with RT and chemo.   Last November he complained of some palpitations. Event monitor short short bursts of SVT and NSVT. No bradycardia or pauses. We increased his Coreg dose at that time.  He was seen in February and he noted he had extensive Urologic evaluation for an elevated PSA with no cancer found. He did complain of getting tired real easy with activity.  No edema. No chest pain. He does note some change in his breathing.  We updated an Echocardiogram which demonstrated significant decrease in LV function with  EF down to 20-25%. There was also evidence of significant pulmonary HTN.   We performed cardiac cath showing nonobstructive CAD. Moderately elevated LV filling pressures. Moderate pulmonary HTN.  He was started on Lasix 40 mg daily. Continued Coreg and Jardiance. Added Entresto. Now up to 49/51 mg bid. Added aldactone 25 mg daily. He is tolerating this well. He has noted that if he takes everything at once in the morning he gets real lightheaded. He states this is much better.   Still active walking and playing golf.   After optimization of medication Echo was repeated and was unchanged with EF 20-25%. He was seen by Dr Curt Bears and ICD was implanted in June. Since then on remote monitoring he has 3 episodes of NSVT the longest lasting 6 seconds. He was unaware.   He did call earlier this month with increased SOB and PND. Pro BNP was > 6000. His lasix was increased back to 40 mg daily. Since then his symptoms have improved. No edema. No palpitations. He  has seen Dr Carolin Sicks with [facility] for stage 3b CKD.      Current Outpatient Medications on File Prior to Visit  Medication Sig Dispense Refill   aspirin EC 81 MG tablet Take 81 mg by mouth daily.     carvedilol (COREG) 25 MG tablet TAKE 1 TABLET BY MOUTH TWICE DAILY WITH A MEAL 180 tablet 0   dorzolamide-timolol (COSOPT) 22.3-6.8 MG/ML ophthalmic solution Place 1 drop into both eyes 2 (two) times daily.     empagliflozin (JARDIANCE) 10 MG TABS tablet Take 1 tablet (10 mg total) by mouth daily before breakfast. 90 tablet 1   furosemide (LASIX) 40 MG tablet Take 1 tablet (40 mg total) by mouth daily. 30 tablet 3   latanoprost (XALATAN) 0.005 % ophthalmic solution Place 1 drop into both eyes at bedtime.      levothyroxine (SYNTHROID) 75 MCG tablet TAKE 1 TABLET BY MOUTH ONCE DAILY 6 IN THE MORNING . APPOINTMENT REQUIRED FOR FUTURE REFILLS 90 tablet 3   Melatonin 10 MG TABS Take 10 mg by mouth at bedtime.      potassium chloride (KLOR-CON) 10 MEQ tablet Take 1 tablet (10 mEq total) by mouth daily. 30 tablet 3   rosuvastatin (CRESTOR) 5 MG tablet Take 1 tablet (5 mg total) by mouth every other day. 45 tablet 3   sacubitril-valsartan (ENTRESTO) 49-51 MG Take 1 tablet by mouth 2 (two) times daily. 180 tablet 3   sodium fluoride (FLUORISHIELD) 1.1 %  GEL dental gel PLACE 1 APPLICATION ONTO THE TEETH AT BEDTIME 112 mL 11   tamsulosin (FLOMAX) 0.4 MG CAPS capsule Take 2 capsules (0.8 mg total) by mouth daily. 180 capsule 3   triamcinolone cream (KENALOG) 0.1 % Apply 1 application. topically 2 (two) times daily. 45 g 2   No current facility-administered medications on file prior to visit.    No Known Allergies  Past Medical History:  Diagnosis Date   ARTHRITIS 10/22/2008   BPH (benign prostatic hyperplasia)    CHF (congestive heart failure) ([place])    sees Dr. Mika Griffitts [name]    Dilated cardiomyopathy [facility])    DIVERTICULOSIS, COLON 12/08/2007   Glaucoma    sees Dr. Heather [place]    H/O  asbestos exposure    History of echocardiogram 05/22/2007   EF was 45-50% / Mild concentric LV hypertrophy with mild global hypokinesis and overall mild systolic dysfunction .  Mild AV sclerosis / Mild Mitral insufficiency / compared to prior study 04/24/02 -- LV function has improved further.     History of kidney stones    Hypercholesterolemia    Hypertension    Insomnia 06/11/2015   Radiation 01/01/15-02/18/15   base of tongue and bilateral neck 70 Gy   Skin cancer    squamous cell, basal cell   Squamous cell carcinoma 11/20/14   base of tongue primary   Throat cancer ([place]) 10/2014   had chemo   Thyroid disease     Past Surgical History:  Procedure Laterality Date   CARDIAC CATHETERIZATION  10/17/2001   EF estimated at 30% / moderate LV enlargement  / 1. Minimal nonobstructive atherosclerotic coronary artery disease / 2. Severe LV dysfunction with global hypokinesia consistent with dilated nonischemic cardiomyopath / 3. Moderate pulmonary hypertension   COLONOSCOPY  06/23/2017   per Dr. Carlean Purl, clear, no repeats needed    ICD IMPLANT N/A 04/02/2021   Procedure: ICD IMPLANT;  Surgeon: [name], MD;  Location: [facility];  Service: Cardiovascular;  Laterality: N/A;   LYMPH NODE BIOPSY     RIGHT/LEFT HEART CATH AND CORONARY ANGIOGRAPHY N/A 07/22/2020   Procedure: RIGHT/LEFT HEART CATH AND CORONARY ANGIOGRAPHY;  Surgeon: [name], Cheron Coryell M, MD;  Location: [facility];  Service: Cardiovascular;  Laterality: N/A;    Social History   Tobacco Use  Smoking Status Never  Smokeless Tobacco Never    Social History   Substance and Sexual Activity  Alcohol Use Yes   Alcohol/week: 0.0 standard drinks of alcohol   Comment: occasional beer, a couple times a month    Family History  Problem Relation Age of Onset   Stroke Father    Cancer Father        kidney ca   Angina Mother    Colon cancer Neg Hx    Esophageal cancer Neg Hx    Rectal cancer Neg Hx     Stomach cancer Neg Hx     Review of Systems:  All other systems were reviewed and are negative.  Physical Exam: BP 106/69   Pulse 74   Ht '5\' 9"'$  (1.753 m)   Wt 157 lb 9.6 oz (71.5 kg)   SpO2 99%   BMI 23.27 kg/m  GENERAL:  Well appearing WM in NAD HEENT:  PERRL, EOMI, sclera are clear. Oropharynx is clear. NECK:  No jugular venous distention, carotid upstroke brisk and symmetric, no bruits, no thyromegaly or adenopathy LUNGS:  Clear to auscultation bilaterally CHEST:  Unremarkable HEART:  RRR ,  PMI not displaced or sustained,S1 and S2 within normal limits, no S3, no S4: no clicks, no rubs, no murmurs ABD:  Soft, nontender. BS +, no masses or bruits. No hepatomegaly, no splenomegaly EXT:  2 + pulses throughout, no edema, no cyanosis no clubbing SKIN:  Warm and dry.  No rashes NEURO:  Alert and oriented x 3. Cranial nerves II through XII intact. PSYCH:  Cognitively intact   LABORATORY DATA:  Lab Results  Component Value Date   WBC 10.7 03/27/2021   HGB 16.1 03/27/2021   HCT 48.1 03/27/2021   PLT 225 03/27/2021   GLUCOSE 87 06/23/2022   CHOL 129 01/07/2022   TRIG 165 (H) 01/07/2022   HDL 37 (L) 01/07/2022   LDLCALC 64 01/07/2022   ALT 19 01/07/2022   AST 22 01/07/2022   NA 146 (H) 06/23/2022   K 4.2 06/23/2022   CL 104 06/23/2022   CREATININE 1.91 (H) 06/23/2022   BUN 17 06/23/2022   CO2 28 06/23/2022   TSH 1.03 03/03/2022   PSA 7.93 (H) 10/15/2019   INR 0.95 07/17/2015   HGBA1C 5.9 (A) 04/27/2021   Ecg today shows NSR rate 74. PVCs, IVCD. I have personally reviewed and interpreted this study.   Echo 11/02/16: Study Conclusions   - Left ventricle: The cavity size was mildly dilated. Wall   thickness was normal. Systolic function was moderately reduced.   The estimated ejection fraction was in the range of 35% to 40%.   Diffuse hypokinesis. Echogenic linear structure at the apex,   suggestive of calcified apical false tendon. Doppler parameters   are  consistent with pseudonormal left ventricular relaxation   (grade 2 diastolic dysfunction). The E/e&' ratio is >15,   suggesting elevated LV filling pressure. - Mitral valve: Mildly thickened leaflets . There was mild   regurgitation. - Left atrium: The atrium was mildly dilated. - Inferior vena cava: The vessel was dilated. The respirophasic   diameter changes were blunted (< 50%), consistent with elevated   central venous pressure.   Impressions:   - Compared to a prior study in 2012, the LVEF is lower at 35-40%.   The LV is mildly dilated and globally hypokinetic. LV filling   pressure appears elevated with grade 2 DD.  Echo 07/14/20: 1. Left ventricular ejection fraction, by estimation, is 20 to 25%. The left ventricle has severely decreased function. The left ventricle demonstrates global hypokinesis. The left ventricular internal cavity size was mildly dilated. Left ventricular diastolic parameters are consistent with Grade III diastolic dysfunction (restrictive). Elevated left ventricular end-diastolic pressure. 2. Right ventricular systolic function is normal. The right ventricular size is normal. There is severely elevated pulmonary artery systolic pressure. 3. Left atrial size was severely dilated. 4. The mitral valve is normal in structure. Mild mitral valve regurgitation. No evidence of mitral stenosis. 5. The aortic valve is tricuspid. There is mild calcification of the aortic valve. There is mild thickening of the aortic valve. Aortic valve regurgitation is not visualized. No aortic stenosis is present. 6. The inferior vena cava is normal in size with <50% respiratory variability, suggesting right atrial pressure of 8 mmHg.  Cardiac cath 07/22/20:  RIGHT/LEFT HEART CATH AND CORONARY ANGIOGRAPHY  Conclusion    Prox LAD to Mid LAD lesion is 25% stenosed. Prox Cx to Dist Cx lesion is 25% stenosed. Prox RCA to Mid RCA lesion is 15% stenosed. LV end diastolic pressure  is moderately elevated. Hemodynamic findings consistent with moderate pulmonary hypertension.   1.  Nonobstructive CAD 2. Low cardiac output. Index 1.86 3. Moderately elevated LV filling pressures 4. Moderate Pulmonary HTN   Plan: optimize medical therapy for CHF. On Coreg. Just started [place]. Will add lasix 40 mg daily. Further titration of medication depending on initial response.    Event monitor 10/03/19: Study Highlights  Normal sinus rhythm Occasional runs of NSVT longest lasting 7 beats. Infrequent runs of SVT longest 9 beats. No significant bradycardia or pauses Average HR 71 bpm     Echo 01/05/21: IMPRESSIONS     1. Left ventricular ejection fraction, by estimation, is 20 to 25%. The  left ventricle has severely decreased function. The left ventricle  demonstrates global hypokinesis. The left ventricular internal cavity size  was mildly to moderately dilated. Left  ventricular diastolic parameters are consistent with Grade II diastolic  dysfunction (pseudonormalization). Elevated left atrial pressure.   2. Right ventricular systolic function is normal. The right ventricular  size is normal.   3. Left atrial size was mild to moderately dilated.   4. The mitral valve is normal in structure. Mild mitral valve  regurgitation.   5. The aortic valve is tricuspid. Aortic valve regurgitation is not  visualized. Mild aortic valve sclerosis is present, with no evidence of  aortic valve stenosis.   6. The inferior vena cava is normal in size with greater than 50%  respiratory variability, suggesting right atrial pressure of 3 mmHg.   Comparison(s): The left ventricular function is unchanged.    Assessment / Plan:  1. Dilated cardiomyopathy with EF 35-40% in 2018.   Echo shows significant decline in LV function with EF 20-25%.  Symptoms have improved with recent increase in lasix.  Now class 1-2. He is on optimal medical therapy.   S/p ICD implant followed by Dr Curt Bears.    2. Hypertension well controlled.  3. PVCs/brief SVT/NSVT.  Continue carvedilol but at lower dose 25 mg bid. Follow up with EP  4. SSCA of the tongue. S/p RT and chemo.   5. CKD stage 3b. Followed by Nephrology.   Follow up in 6 months

## 2022-07-02 ENCOUNTER — Ambulatory Visit (INDEPENDENT_AMBULATORY_CARE_PROVIDER_SITE_OTHER): Payer: PPO

## 2022-07-02 DIAGNOSIS — I428 Other cardiomyopathies: Secondary | ICD-10-CM | POA: Diagnosis not present

## 2022-07-05 ENCOUNTER — Ambulatory Visit: Payer: PPO | Attending: Cardiology | Admitting: Cardiology

## 2022-07-05 ENCOUNTER — Encounter: Payer: Self-pay | Admitting: Cardiology

## 2022-07-05 VITALS — BP 106/69 | HR 74 | Ht 69.0 in | Wt 157.6 lb

## 2022-07-05 DIAGNOSIS — I10 Essential (primary) hypertension: Secondary | ICD-10-CM

## 2022-07-05 DIAGNOSIS — N1832 Chronic kidney disease, stage 3b: Secondary | ICD-10-CM

## 2022-07-05 DIAGNOSIS — I5022 Chronic systolic (congestive) heart failure: Secondary | ICD-10-CM

## 2022-07-05 DIAGNOSIS — I428 Other cardiomyopathies: Secondary | ICD-10-CM | POA: Diagnosis not present

## 2022-07-05 LAB — CUP PACEART REMOTE DEVICE CHECK
Battery Remaining Longevity: 112 mo
Battery Remaining Percentage: 88 %
Battery Voltage: 3.02 V
Brady Statistic RV Percent Paced: 1 %
Date Time Interrogation Session: 20230922020056
HighPow Impedance: 55 Ohm
Implantable Lead Implant Date: 20220623
Implantable Lead Location: 753860
Implantable Lead Model: 7122
Implantable Pulse Generator Implant Date: 20220623
Lead Channel Impedance Value: 350 Ohm
Lead Channel Pacing Threshold Amplitude: 0.75 V
Lead Channel Pacing Threshold Pulse Width: 0.5 ms
Lead Channel Sensing Intrinsic Amplitude: 11.8 mV
Lead Channel Setting Pacing Amplitude: 2 V
Lead Channel Setting Pacing Pulse Width: 0.5 ms
Lead Channel Setting Sensing Sensitivity: 0.5 mV
Pulse Gen Serial Number: 810027079

## 2022-07-06 ENCOUNTER — Ambulatory Visit (INDEPENDENT_AMBULATORY_CARE_PROVIDER_SITE_OTHER): Payer: PPO

## 2022-07-06 ENCOUNTER — Encounter: Payer: Self-pay | Admitting: Cardiology

## 2022-07-06 ENCOUNTER — Other Ambulatory Visit: Payer: Self-pay

## 2022-07-06 ENCOUNTER — Ambulatory Visit: Payer: PPO | Attending: Cardiology | Admitting: Cardiology

## 2022-07-06 VITALS — BP 118/68 | HR 79 | Ht 69.0 in | Wt 156.4 lb

## 2022-07-06 DIAGNOSIS — I493 Ventricular premature depolarization: Secondary | ICD-10-CM | POA: Diagnosis not present

## 2022-07-06 DIAGNOSIS — I5022 Chronic systolic (congestive) heart failure: Secondary | ICD-10-CM | POA: Diagnosis not present

## 2022-07-06 MED ORDER — FUROSEMIDE 40 MG PO TABS: 40.0000 mg | ORAL_TABLET | Freq: Every day | ORAL | 3 refills | Status: DC

## 2022-07-06 NOTE — Patient Instructions (Signed)
Medication Instructions:  Your physician recommends that you continue on your current medications as directed. Please refer to the Current Medication list given to you today.  *If you need a refill on your cardiac medications before your next appointment, please call your pharmacy*   Lab Work: None ordered If you have labs (blood work) drawn today and your tests are completely normal, you will receive your results only by: [facility] (if you have MyChart) OR A paper copy in the mail If you have any lab test that is abnormal or we need to change your treatment, we will call you to review the results.   Testing/Procedures:                           [facility]- Long Term Monitor Instructions  Your physician has requested you wear a ZIO patch monitor for 7 days.  This is a single patch monitor. [employer] supplies one patch monitor per enrollment. Additional stickers are not available. Please do not apply patch if you will be having a Nuclear Stress Test,  Echocardiogram, Cardiac CT, MRI, or Chest Xray during the period you would be wearing the  monitor. The patch cannot be worn during these tests. You cannot remove and re-apply the  ZIO XT patch monitor.  Your ZIO patch monitor will be mailed 3 day USPS to your address on file. It may take 3-5 days  to receive your monitor after you have been enrolled.  Once you have received your monitor, please review the enclosed instructions. Your monitor  has already been registered assigning a specific monitor serial # to you.  Billing and Patient Assistance Program Information  We have supplied [employer] with any of your insurance information on file for billing purposes. [employer] offers a sliding scale Patient Assistance Program for patients that do not have  insurance, or whose insurance does not completely cover the cost of the ZIO monitor.  You must apply for the Patient Assistance Program to qualify for this discounted rate.  To apply, please  call [employer] at [phone], select option 4, select option 2, ask to apply for  Patient Assistance Program. Theodore Demark will ask your household income, and how many people  are in your household. They will quote your out-of-pocket cost based on that information.  [employer] will also be able to set up a 19-month interest-free payment plan if needed.  Applying the monitor   Shave hair from upper left chest.  Hold abrader disc by orange tab. Rub abrader in 40 strokes over the upper left chest as  indicated in your monitor instructions.  Clean area with 4 enclosed alcohol pads. Let dry.  Apply patch as indicated in monitor instructions. Patch will be placed under collarbone on left  side of chest with arrow pointing upward.  Rub patch adhesive wings for 2 minutes. Remove white label marked "1". Remove the white  label marked "2". Rub patch adhesive wings for 2 additional minutes.  While looking in a mirror, press and release button in center of patch. A small green light will  flash 3-4 times. This will be your only indicator that the monitor has been turned on.  Do not shower for the first 24 hours. You may shower after the first 24 hours.  Press the button if you feel a symptom. You will hear a small click. Record Date, Time and  Symptom in the Patient Logbook.  When you are ready to remove the  patch, follow instructions on the last 2 pages of Patient  Logbook. Stick patch monitor onto the last page of Patient Logbook.  Place Patient Logbook in the blue and white box. Use locking tab on box and tape box closed  securely. The blue and white box has prepaid postage on it. Please place it in the mailbox as  soon as possible. Your physician should have your test results approximately 7 days after the  monitor has been mailed back to [facility].  Call [facility] at [phone] if you have questions regarding  your ZIO XT patch monitor. Call them immediately if you see an  orange light blinking on your  monitor.  If your monitor falls off in less than 4 days, contact our Monitor department at [phone].  If your monitor becomes loose or falls off after 4 days call [employer] at [phone] for  suggestions on securing your monitor     Follow-Up: At [facility], you and your health needs are our priority.  As part of our continuing mission to provide you with exceptional heart care, we have created designated Provider Care Teams.  These Care Teams include your primary Cardiologist (physician) and Advanced Practice Providers (APPs -  Physician Assistants and Nurse Practitioners) who all work together to provide you with the care you need, when you need it.   Your next appointment:   1 year(s)  The format for your next appointment:   In Person  Provider:   Allegra Lai, MD    Thank you for choosing [facility]!!   Trinidad Curet, RN [phone]  Other Instructions   Important Information About [employer]

## 2022-07-06 NOTE — Progress Notes (Unsigned)
Enrolled for [employer] to mail a ZIO XT long term holter monitor to the patients address on file.  

## 2022-07-06 NOTE — Progress Notes (Signed)
Electrophysiology Office Note   Date:  07/06/2022   ID:  Nicholas Wilkerson, DOB Oct 07, 1941, MRN [id number]  PCP:  Laurey Morale, MD  Cardiologist:  [name] Primary Electrophysiologist:  [name], MD    Chief Complaint: CHF   History of Present Illness: Nicholas Wilkerson is a 81 y.o. male who is being seen today for the evaluation of CHF at the request of Laurey Morale, MD. Presenting today for electrophysiology evaluation.  He has a history significant for dilated cardiomyopathy, hypertension, hyperlipidemia, current squamous cell carcinoma of the base of the tongue postchemotherapy and radiation.  November 2021 he developed palpitations.  Monitor showed bursts of SVT and nonsustained VT.  His carvedilol was increased.  January 2022 he developed fatigue.  He was found to have an ejection fraction 20 to 25%.  He is status post [place] ICD implanted 04/02/2021.  Today, denies symptoms of palpitations, chest pain, shortness of breath, orthopnea, PND, lower extremity edema, claudication, dizziness, presyncope, syncope, bleeding, or neurologic sequela. The patient is tolerating medications without difficulties.  Since being seen he is overall done well.  He does complain of some weakness and fatigue.  He states that he is having to hire people to help him around the house as opposed to doing all the work himself.    Past Medical History:  Diagnosis Date   ARTHRITIS 10/22/2008   BPH (benign prostatic hyperplasia)    CHF (congestive heart failure) ([place])    sees Dr. Peter [name]    Dilated cardiomyopathy [facility])    DIVERTICULOSIS, COLON 12/08/2007   Glaucoma    sees Dr. Heather [place]    H/O asbestos exposure    History of echocardiogram 05/22/2007   EF was 45-50% / Mild concentric LV hypertrophy with mild global hypokinesis and overall mild systolic dysfunction .  Mild AV sclerosis / Mild Mitral insufficiency / compared to prior study 04/24/02 -- LV function has improved further.      History of kidney stones    Hypercholesterolemia    Hypertension    Insomnia 06/11/2015   Radiation 01/01/15-02/18/15   base of tongue and bilateral neck 70 Gy   Skin cancer    squamous cell, basal cell   Squamous cell carcinoma 11/20/14   base of tongue primary   Throat cancer ([place]) 10/2014   had chemo   Thyroid disease    Past Surgical History:  Procedure Laterality Date   CARDIAC CATHETERIZATION  10/17/2001   EF estimated at 30% / moderate LV enlargement  / 1. Minimal nonobstructive atherosclerotic coronary artery disease / 2. Severe LV dysfunction with global hypokinesia consistent with dilated nonischemic cardiomyopath / 3. Moderate pulmonary hypertension   COLONOSCOPY  06/23/2017   per Dr. Carlean Purl, clear, no repeats needed    ICD IMPLANT N/A 04/02/2021   Procedure: ICD IMPLANT;  Surgeon: [name], MD;  Location: [facility];  Service: Cardiovascular;  Laterality: N/A;   LYMPH NODE BIOPSY     RIGHT/LEFT HEART CATH AND CORONARY ANGIOGRAPHY N/A 07/22/2020   Procedure: RIGHT/LEFT HEART CATH AND CORONARY ANGIOGRAPHY;  Surgeon: [name], [name], MD;  Location: [facility];  Service: Cardiovascular;  Laterality: N/A;     Current Outpatient Medications  Medication Sig Dispense Refill   aspirin EC 81 MG tablet Take 81 mg by mouth daily.     carvedilol (COREG) 25 MG tablet TAKE 1 TABLET BY MOUTH TWICE DAILY WITH A MEAL 180 tablet 0   dorzolamide-timolol (COSOPT)  22.3-6.8 MG/ML ophthalmic solution Place 1 drop into both eyes 2 (two) times daily.     empagliflozin (JARDIANCE) 10 MG TABS tablet Take 1 tablet (10 mg total) by mouth daily before breakfast. 90 tablet 1   furosemide (LASIX) 40 MG tablet Take 1 tablet (40 mg total) by mouth daily. 30 tablet 3   latanoprost (XALATAN) 0.005 % ophthalmic solution Place 1 drop into both eyes at bedtime.      levothyroxine (SYNTHROID) 75 MCG tablet TAKE 1 TABLET BY MOUTH ONCE DAILY 6 IN THE MORNING . APPOINTMENT REQUIRED FOR  FUTURE REFILLS 90 tablet 3   Melatonin 10 MG TABS Take 10 mg by mouth at bedtime.      potassium chloride (KLOR-CON) 10 MEQ tablet Take 1 tablet (10 mEq total) by mouth daily. 30 tablet 3   rosuvastatin (CRESTOR) 5 MG tablet Take 1 tablet (5 mg total) by mouth every other day. 45 tablet 3   sacubitril-valsartan (ENTRESTO) 49-51 MG Take 1 tablet by mouth 2 (two) times daily. 180 tablet 3   sodium fluoride (FLUORISHIELD) 1.1 % GEL dental gel PLACE 1 APPLICATION ONTO THE TEETH AT BEDTIME 112 mL 11   tamsulosin (FLOMAX) 0.4 MG CAPS capsule Take 2 capsules (0.8 mg total) by mouth daily. 180 capsule 3   triamcinolone cream (KENALOG) 0.1 % Apply 1 application. topically 2 (two) times daily. 45 g 2   No current facility-administered medications for this visit.    Allergies:   Patient has no known allergies.   Social History:  The patient  reports that he has never smoked. He has never used smokeless tobacco. He reports current alcohol use. He reports that he does not use drugs.   Family History:  The patient's family history includes Angina in his mother; Cancer in his father; Stroke in his father.   ROS:  Please see the history of present illness.   Otherwise, review of systems is positive for none.   All other systems are reviewed and negative.   PHYSICAL EXAM: VS:  BP 118/68   Pulse 79   Ht '5\' 9"'$  (1.753 m)   Wt 156 lb 6.4 oz (70.9 kg)   SpO2 98%   BMI 23.10 kg/m  , BMI Body mass index is 23.1 kg/m. GEN: Well nourished, well developed, in no acute distress  HEENT: normal  Neck: no JVD, carotid bruits, or masses Cardiac: irregular; no murmurs, rubs, or gallops,no edema  Respiratory:  clear to auscultation bilaterally, normal work of breathing GI: soft, nontender, nondistended, + BS MS: no deformity or atrophy  Skin: warm and dry, device site well healed Neuro:  Strength and sensation are intact Psych: euthymic mood, full affect  EKG:  EKG is ordered today. Personal review of the ekg  ordered  shows sinus rhythm, PVCs, rate 79 Personal review of the device interrogation today. Results in [place]: 01/07/2022: ALT 19; Magnesium 2.7 03/03/2022: TSH 1.03 06/23/2022: BUN 17; Creatinine, Ser 1.91; NT-Pro BNP 6,147; Potassium 4.2; Sodium 146    Lipid Panel     Component Value Date/Time   CHOL 129 01/07/2022 1007   TRIG 165 (H) 01/07/2022 1007   TRIG 126 08/25/2006 0850   HDL 37 (L) 01/07/2022 1007   CHOLHDL 3.5 01/07/2022 1007   CHOLHDL 5 10/15/2019 1058   VLDL 20.4 10/15/2019 1058   LDLCALC 64 01/07/2022 1007     Wt Readings from Last 3 Encounters:  07/06/22 156 lb 6.4 oz (70.9 kg)  07/05/22 157 lb  9.6 oz (71.5 kg)  04/21/22 163 lb (73.9 kg)      Other studies Reviewed: Additional studies/ records that were reviewed today include: TTE 01/05/21  Review of the above records today demonstrates:   1. Left ventricular ejection fraction, by estimation, is 20 to 25%. The  left ventricle has severely decreased function. The left ventricle  demonstrates global hypokinesis. The left ventricular internal cavity size  was mildly to moderately dilated. Left  ventricular diastolic parameters are consistent with Grade II diastolic  dysfunction (pseudonormalization). Elevated left atrial pressure.   2. Right ventricular systolic function is normal. The right ventricular  size is normal.   3. Left atrial size was mild to moderately dilated.   4. The mitral valve is normal in structure. Mild mitral valve  regurgitation.   5. The aortic valve is tricuspid. Aortic valve regurgitation is not  visualized. Mild aortic valve sclerosis is present, with no evidence of  aortic valve stenosis.   6. The inferior vena cava is normal in size with greater than 50%  respiratory variability, suggesting right atrial pressure of 3 mmHg.   RHC/LHC 07/22/20 Prox LAD to Mid LAD lesion is 25% stenosed. Prox Cx to Dist Cx lesion is 25% stenosed. Prox RCA to Mid RCA lesion is 15%  stenosed. LV end diastolic pressure is moderately elevated. Hemodynamic findings consistent with moderate pulmonary hypertension.   ASSESSMENT AND PLAN:  1.  Chronic systolic heart failure due to dilated cardiomyopathy: Currently on optimal medical therapy with carvedilol, Entresto, Aldactone, Jardiance.  Status post [place] ICD implanted 04/02/2021.  Device functioning appropriately.  No changes at this time.  2.  Hypertension: Currently well controlled  3.  PVCs/SVT/nonsustained VT: Currently on carvedilol.  Appears to have a high burden of PVCs on his ECG today.  He is in ventricular bigeminy.  He feels weak and fatigued.  We Thaila Bottoms have him wear a 7-day monitor to get an accurate burden of his PVCs.  Zeb Rawl likely need medications for suppression.   Current medicines are reviewed at length with the patient today.   The patient does not have concerns regarding his medicines.  The following changes were made today: None  Labs/ tests ordered today include:  Orders Placed This Encounter  Procedures   LONG TERM MONITOR (3-14 DAYS)   EKG 12-Lead      Disposition:   FU with Mafalda Mcginniss 12 months  Signed, Parveen Freehling Meredith Leeds, MD  07/06/2022 4:02 PM     [address] [place] [address] [phone] (office) [phone] (fax)

## 2022-07-07 LAB — CUP PACEART INCLINIC DEVICE CHECK
Battery Remaining Longevity: 114 mo
Brady Statistic RV Percent Paced: 0.07 %
Date Time Interrogation Session: 20230926152100
HighPow Impedance: 56.25 Ohm
Implantable Lead Implant Date: 20220623
Implantable Lead Location: 753860
Implantable Lead Model: 7122
Implantable Pulse Generator Implant Date: 20220623
Lead Channel Impedance Value: 387.5 Ohm
Lead Channel Pacing Threshold Amplitude: 0.75 V
Lead Channel Pacing Threshold Amplitude: 0.75 V
Lead Channel Pacing Threshold Pulse Width: 0.5 ms
Lead Channel Pacing Threshold Pulse Width: 0.5 ms
Lead Channel Sensing Intrinsic Amplitude: 11.8 mV
Lead Channel Setting Pacing Amplitude: 2 V
Lead Channel Setting Pacing Pulse Width: 0.5 ms
Lead Channel Setting Sensing Sensitivity: 0.5 mV
Pulse Gen Serial Number: 810027079

## 2022-07-09 NOTE — Progress Notes (Signed)
Remote ICD transmission.   

## 2022-07-10 DIAGNOSIS — I493 Ventricular premature depolarization: Secondary | ICD-10-CM

## 2022-07-14 DIAGNOSIS — R338 Other retention of urine: Secondary | ICD-10-CM | POA: Diagnosis not present

## 2022-07-14 DIAGNOSIS — N401 Enlarged prostate with lower urinary tract symptoms: Secondary | ICD-10-CM | POA: Diagnosis not present

## 2022-07-14 DIAGNOSIS — R972 Elevated prostate specific antigen [PSA]: Secondary | ICD-10-CM | POA: Diagnosis not present

## 2022-07-21 DIAGNOSIS — H401112 Primary open-angle glaucoma, right eye, moderate stage: Secondary | ICD-10-CM | POA: Diagnosis not present

## 2022-07-26 DIAGNOSIS — I493 Ventricular premature depolarization: Secondary | ICD-10-CM | POA: Diagnosis not present

## 2022-08-04 ENCOUNTER — Other Ambulatory Visit (HOSPITAL_COMMUNITY): Payer: Self-pay

## 2022-08-04 DIAGNOSIS — R338 Other retention of urine: Secondary | ICD-10-CM | POA: Diagnosis not present

## 2022-08-04 DIAGNOSIS — N401 Enlarged prostate with lower urinary tract symptoms: Secondary | ICD-10-CM | POA: Diagnosis not present

## 2022-08-05 DIAGNOSIS — R338 Other retention of urine: Secondary | ICD-10-CM | POA: Diagnosis not present

## 2022-08-05 DIAGNOSIS — N401 Enlarged prostate with lower urinary tract symptoms: Secondary | ICD-10-CM | POA: Diagnosis not present

## 2022-08-09 ENCOUNTER — Other Ambulatory Visit (HOSPITAL_COMMUNITY): Payer: Self-pay

## 2022-08-09 ENCOUNTER — Other Ambulatory Visit: Payer: Self-pay | Admitting: Urology

## 2022-08-09 ENCOUNTER — Telehealth: Payer: Self-pay | Admitting: Cardiology

## 2022-08-09 NOTE — Telephone Encounter (Signed)
   Pre-operative Risk Assessment    Patient Name: Nicholas Wilkerson  DOB: 1941/06/01 MRN: [id number]      Request for Surgical Clearance    Procedure:   cystoscopy and transurethral of the prostate   Date of Surgery:  Clearance 08/25/22                                 Surgeon:  Dr. Wyvonnia Dusky Group or Practice Name:  [facility] Phone number:  [phone] Fax number:  [phone]   Type of Clearance Requested:   - Medical  - Pharmacy:  Hold Aspirin 5 days   Type of Anesthesia:  General  or spinal   Additional requests/questions:      SignedMilbert Coulter   08/09/2022, 4:36 PM

## 2022-08-10 NOTE — Telephone Encounter (Signed)
   Name: Nicholas Wilkerson  DOB: 10-25-1940  MRN: [id number]  Primary Cardiologist: None   Preoperative team, please contact this patient and set up a phone call appointment for further preoperative risk assessment, to reassess symptoms given recent escalation of diuresis. No prior PCI, therefore aspirin could be held as requested for procedure. Please obtain consent and complete medication review. Thank you for your help.    Christell Faith, PA-C 08/10/2022, 8:43 AM [place]

## 2022-08-10 NOTE — Telephone Encounter (Signed)
PRIMARY CARD DR. [name].   Tried to call the pt to set up a tel pre op appt. No answer or vm came on.

## 2022-08-11 ENCOUNTER — Encounter: Payer: Self-pay | Admitting: Cardiology

## 2022-08-11 ENCOUNTER — Telehealth: Payer: Self-pay | Admitting: *Deleted

## 2022-08-11 NOTE — Telephone Encounter (Signed)
I s/w the pt and he is agreeable to plan of care and tele pre op appt 08/16/22 @ 2:20. Med rec and consent are done.

## 2022-08-11 NOTE — Telephone Encounter (Signed)
Patient returned RN's call. Patient stated can reach him at [phone].

## 2022-08-11 NOTE — Telephone Encounter (Signed)
I tried to call the pt back but no answer or vm

## 2022-08-11 NOTE — Telephone Encounter (Signed)
I s/w the pt and he is agreeable to plan of care for tele pre op 08/16/22 @ 2:20. Med rec and consent are done.    Patient Consent for Virtual Visit        Nicholas Wilkerson has provided verbal consent on 08/11/2022 for a virtual visit (video or telephone).   CONSENT FOR VIRTUAL VISIT FOR:  Nicholas Wilkerson  By participating in this virtual visit I agree to the following:  I hereby voluntarily request, consent and authorize [facility] and its employed or contracted physicians, physician assistants, nurse practitioners or other licensed health care professionals (the Practitioner), to provide me with telemedicine health care services (the "Services") as deemed necessary by the treating Practitioner. I acknowledge and consent to receive the Services by the Practitioner via telemedicine. I understand that the telemedicine visit will involve communicating with the Practitioner through live audiovisual communication technology and the disclosure of certain medical information by electronic transmission. I acknowledge that I have been given the opportunity to request an in-person assessment or other available alternative prior to the telemedicine visit and am voluntarily participating in the telemedicine visit.  I understand that I have the right to withhold or withdraw my consent to the use of telemedicine in the course of my care at any time, without affecting my right to future care or treatment, and that the Practitioner or I may terminate the telemedicine visit at any time. I understand that I have the right to inspect all information obtained and/or recorded in the course of the telemedicine visit and may receive copies of available information for a reasonable fee.  I understand that some of the potential risks of receiving the Services via telemedicine include:  Delay or interruption in medical evaluation due to technological equipment failure or disruption; Information transmitted may not  be sufficient (e.g. poor resolution of images) to allow for appropriate medical decision making by the Practitioner; and/or  In rare instances, security protocols could fail, causing a breach of personal health information.  Furthermore, I acknowledge that it is my responsibility to provide information about my medical history, conditions and care that is complete and accurate to the best of my ability. I acknowledge that Practitioner's advice, recommendations, and/or decision may be based on factors not within their control, such as incomplete or inaccurate data provided by me or distortions of diagnostic images or specimens that may result from electronic transmissions. I understand that the practice of medicine is not an exact science and that Practitioner makes no warranties or guarantees regarding treatment outcomes. I acknowledge that a copy of this consent can be made available to me via my patient portal ([facility]), or I can request a printed copy by calling the office of [facility].    I understand that my insurance will be billed for this visit.   I have read or had this consent read to me. I understand the contents of this consent, which adequately explains the benefits and risks of the Services being provided via telemedicine.  I have been provided ample opportunity to ask questions regarding this consent and the Services and have had my questions answered to my satisfaction. I give my informed consent for the services to be provided through the use of telemedicine in my medical care

## 2022-08-15 NOTE — Progress Notes (Unsigned)
Virtual Visit via Telephone Note   Because of Nicholas Wilkerson's co-morbid illnesses, he is at least at moderate risk for complications without adequate follow up.  This format is felt to be most appropriate for this patient at this time.  The patient did not have access to video technology/had technical difficulties with video requiring transitioning to audio format only (telephone).  All issues noted in this document were discussed and addressed.  No physical exam could be performed with this format.  Please refer to the patient's chart for his consent to telehealth for [facility].  Evaluation Performed:  Preoperative cardiovascular risk assessment _____________   Date:  08/16/2022   Patient ID:  Nicholas Wilkerson, DOB [birth year], MRN [id number] Patient Location:  Home Provider location:   Office  Primary Care Provider:  Laurey Morale, MD Primary Cardiologist: Dr. Peter [name]  Chief Complaint / Patient Profile   81 y.o. y/o male with a h/o HTN, dilated cardiomyopathy, palpitations, left arm swelling who is pending cystoscopy with transurethral prostatectomy and presents today for telephonic preoperative cardiovascular risk assessment.  Past Medical History    Past Medical History:  Diagnosis Date   ARTHRITIS 10/22/2008   BPH (benign prostatic hyperplasia)    CHF (congestive heart failure) ([place])    sees Dr. Peter [name]    Dilated cardiomyopathy [facility])    DIVERTICULOSIS, COLON 12/08/2007   Glaucoma    sees Dr. Heather [place]    H/O asbestos exposure    History of echocardiogram 05/22/2007   EF was 45-50% / Mild concentric LV hypertrophy with mild global hypokinesis and Wilkerson mild systolic dysfunction .  Mild AV sclerosis / Mild Mitral insufficiency / compared to prior study 04/24/02 -- LV function has improved further.     History of kidney stones    Hypercholesterolemia    Hypertension    Insomnia 06/11/2015   Radiation 01/01/15-02/18/15   base of tongue and  bilateral neck 70 Gy   Skin cancer    squamous cell, basal cell   Squamous cell carcinoma 11/20/14   base of tongue primary   Throat cancer ([place]) 10/2014   had chemo   Thyroid disease    Past Surgical History:  Procedure Laterality Date   CARDIAC CATHETERIZATION  10/17/2001   EF estimated at 30% / moderate LV enlargement  / 1. Minimal nonobstructive atherosclerotic coronary artery disease / 2. Severe LV dysfunction with global hypokinesia consistent with dilated nonischemic cardiomyopath / 3. Moderate pulmonary hypertension   COLONOSCOPY  06/23/2017   per Dr. Carlean Purl, clear, no repeats needed    ICD IMPLANT N/A 04/02/2021   Procedure: ICD IMPLANT;  Surgeon: [name], MD;  Location: [facility];  Service: Cardiovascular;  Laterality: N/A;   LYMPH NODE BIOPSY     RIGHT/LEFT HEART CATH AND CORONARY ANGIOGRAPHY N/A 07/22/2020   Procedure: RIGHT/LEFT HEART CATH AND CORONARY ANGIOGRAPHY;  Surgeon: [name], [name], MD;  Location: [facility];  Service: Cardiovascular;  Laterality: N/A;    Allergies  No Known Allergies  History of Present Illness    Nicholas Wilkerson is a 81 y.o. male who presents via audio/video conferencing for a telehealth visit today.  Pt was last seen in cardiology clinic on 07/06/2022 by  Dr. Curt Bears.  At that time Nicholas Wilkerson was doing well .  The patient is now pending procedure as outlined above. Since his last visit, he remains stable from a cardiac standpoint.  Today he denies chest  pain, shortness of breath, lower extremity edema, fatigue, palpitations, melena, hematuria, hemoptysis, diaphoresis, weakness, presyncope, syncope, orthopnea, and PND.   Home Medications    Prior to Admission medications   Medication Sig Start Date End Date Taking? Authorizing Provider  aspirin EC 81 MG tablet Take 81 mg by mouth daily.    [provider]  carvedilol (COREG) 25 MG tablet TAKE 1 TABLET BY MOUTH TWICE DAILY WITH A MEAL 05/17/22    [name], [name], MD  dorzolamide-timolol (COSOPT) 22.3-6.8 MG/ML ophthalmic solution Place 1 drop into both eyes 2 (two) times daily.    [provider]  empagliflozin (JARDIANCE) 10 MG TABS tablet Take 1 tablet (10 mg total) by mouth daily before breakfast. 04/16/22   Camnitz, Ocie Doyne, MD  furosemide (LASIX) 40 MG tablet Take 1 tablet (40 mg total) by mouth daily. 07/06/22   [name], [name], MD  latanoprost (XALATAN) 0.005 % ophthalmic solution Place 1 drop into both eyes at bedtime.  12/29/16   [provider]  levothyroxine (SYNTHROID) 75 MCG tablet TAKE 1 TABLET BY MOUTH ONCE DAILY 6 IN THE MORNING . APPOINTMENT REQUIRED FOR FUTURE REFILLS 02/23/22   Laurey Morale, MD  Melatonin 10 MG TABS Take 10 mg by mouth at bedtime.     [provider]  potassium chloride (KLOR-CON) 10 MEQ tablet Take 1 tablet (10 mEq total) by mouth daily. 06/18/22   Croitoru, Mihai, MD  rosuvastatin (CRESTOR) 5 MG tablet Take 1 tablet (5 mg total) by mouth every other day. 03/12/22   Laurey Morale, MD  sacubitril-valsartan (ENTRESTO) 49-51 MG Take 1 tablet by mouth 2 (two) times daily. 09/09/21   [name], [name], MD  sodium fluoride (FLUORISHIELD) 1.1 % GEL dental gel PLACE 1 APPLICATION ONTO THE TEETH AT BEDTIME 12/15/21 12/15/22  Eppie Gibson, MD  tamsulosin (FLOMAX) 0.4 MG CAPS capsule Take 2 capsules (0.8 mg total) by mouth daily. Patient not taking: Reported on 08/11/2022 02/23/22   Laurey Morale, MD  triamcinolone cream (KENALOG) 0.1 % Apply 1 application. topically 2 (two) times daily. 01/25/22   Laurey Morale, MD    Physical Exam    Vital Signs:  Nicholas Wilkerson does not have vital signs available for review today.  Given telephonic nature of communication, physical exam is limited. AAOx3. NAD. Normal affect.  Speech and respirations are unlabored.  Accessory Clinical Findings    None  Assessment & Plan    1.  Preoperative Cardiovascular Risk Assessment: Cystoscopy with  transurethral prostatectomy      Primary Cardiologist: Peter [name], MD  Chart reviewed as part of pre-operative protocol coverage. Given past medical history and time since last visit, based on ACC/AHA guidelines, Nicholas Wilkerson would be at acceptable risk for the planned procedure without further cardiovascular testing.   Patient was advised that if he develops new symptoms prior to surgery to contact our office to arrange a follow-up appointment.  He verbalized understanding.  I will route this recommendation to the requesting party via Epic fax function and remove from pre-op pool.   His aspirin will be held for 5 days prior to his procedure.  Please resume as soon as hemostasis is achieved.  His RCRI is a class III risk, 6.6% risk of major cardiac event.  He is able to complete greater than 4 METS of physical activity.  Time:   Today, I have spent 5 minutes with the patient with telehealth technology discussing medical history, symptoms, and management plan.  Deberah Pelton, NP  08/16/2022, 8:36 AM

## 2022-08-16 ENCOUNTER — Ambulatory Visit: Payer: PPO | Attending: Cardiology | Admitting: General Practice

## 2022-08-16 DIAGNOSIS — Z01810 Encounter for preprocedural cardiovascular examination: Secondary | ICD-10-CM

## 2022-08-19 ENCOUNTER — Encounter: Payer: Self-pay | Admitting: Cardiology

## 2022-08-19 NOTE — Progress Notes (Signed)
[facility] DEVICE PROGRAMMING  Patient Information: Name:  Nicholas Wilkerson  DOB:  Aug 26, 1941  MRN:  [id number]    Planned Procedure:  Cystoscopy./ TURP.  Surgeon:  Dr. Raynelle Bring  Date of Procedure:  08/26/22  Cautery will be used.  Position during surgery:    Please send documentation back to:  [facility] (Fax # [phone])   Device Information:  Clinic EP Physician:  Allegra Lai, MD   Device Type:  Defibrillator Manufacturer and Phone #:  [employer]: [phone] Pacemaker Dependent?:  No. Date of Last Device Check:  07/06/2022 Normal Device Function?:  Yes.    Electrophysiologist's Recommendations:  Have magnet available. Provide continuous ECG monitoring when magnet is used or reprogramming is to be performed.  Procedure should not interfere with device function.  No device programming or magnet placement needed.  Per Device Clinic Standing Orders, Simone Curia, RN  4:15 PM 08/19/2022

## 2022-08-19 NOTE — Patient Instructions (Signed)
DUE TO COVID-19 ONLY TWO VISITORS  (aged 79 and older)  ARE ALLOWED TO COME WITH YOU AND STAY IN THE WAITING ROOM ONLY DURING PRE OP AND PROCEDURE.   **NO VISITORS ARE ALLOWED IN THE SHORT STAY AREA OR RECOVERY ROOM!!**  IF YOU WILL BE ADMITTED INTO THE HOSPITAL YOU ARE ALLOWED ONLY FOUR SUPPORT PEOPLE DURING VISITATION HOURS ONLY (7 AM -8PM)   The support person(s) must pass our screening, gel in and out, and wear a mask at all times, including in the patient's room. Patients must also wear a mask when staff or their support person are in the room. Visitors GUEST BADGE MUST BE WORN VISIBLY  One adult visitor may remain with you overnight and MUST be in the room by 8 P.M.     Your procedure is scheduled on: 08/26/22   Report to [facility] Main Entrance    Report to admitting at : 5:15 AM   Call this number if you have problems the morning of surgery [phone]   Do not eat food :After Midnight.   After Midnight you may have the following liquids until: 4:00 AM DAY OF SURGERY  Water Black Coffee (sugar ok, NO MILK/CREAM OR CREAMERS)  Tea (sugar ok, NO MILK/CREAM OR CREAMERS) regular and decaf                             Plain Jell-O (NO RED)                                           Fruit ices (not with fruit pulp, NO RED)                                     Popsicles (NO RED)                                                                  Juice: apple, WHITE grape, WHITE cranberry Sports drinks like Gatorade (NO RED)               Oral Hygiene is also important to reduce your risk of infection.                                    Remember - BRUSH YOUR TEETH THE MORNING OF SURGERY WITH YOUR REGULAR TOOTHPASTE   Do NOT smoke after Midnight   Take these medicines the morning of surgery with A SIP OF WATER:carvedilol,synthroid.Use eye drops as usual.   DO NOT TAKE ANY ORAL DIABETIC MEDICATIONS DAY OF YOUR SURGERY                              You may not have any metal  on your body including hair pins, jewelry, and body piercing             Do not wear lotions, powders, perfumes/cologne, or deodorant  Men may shave face and neck.   Do not bring valuables to the hospital. [place].   Contacts, dentures or bridgework may not be worn into surgery.   Bring small overnight bag day of surgery.   DO NOT [place]. PHARMACY WILL DISPENSE MEDICATIONS LISTED ON YOUR MEDICATION LIST TO YOU DURING YOUR ADMISSION [place]!    Patients discharged on the day of surgery will not be allowed to drive home.  Someone NEEDS to stay with you for the first 24 hours after anesthesia.   Special Instructions: Bring a copy of your healthcare power of attorney and living will documents         the day of surgery if you haven't scanned them before.              Please read over the following fact sheets you were given: IF YOU HAVE QUESTIONS ABOUT YOUR PRE-OP INSTRUCTIONS PLEASE CALL [phone]    [facility] - Preparing for Surgery Before surgery, you can play an important role.  Because skin is not sterile, your skin needs to be as free of germs as possible.  You can reduce the number of germs on your skin by washing with CHG (chlorahexidine gluconate) soap before surgery.  CHG is an antiseptic cleaner which kills germs and bonds with the skin to continue killing germs even after washing. Please DO NOT use if you have an allergy to CHG or antibacterial soaps.  If your skin becomes reddened/irritated stop using the CHG and inform your nurse when you arrive at Short Stay. Do not shave (including legs and underarms) for at least 48 hours prior to the first CHG shower.  You may shave your face/neck. Please follow these instructions carefully:  1.  Shower with CHG Soap the night before surgery and the  morning of Surgery.  2.  If you choose to wash your hair, wash your hair first as  usual with your  normal  shampoo.  3.  After you shampoo, rinse your hair and body thoroughly to remove the  shampoo.                           4.  Use CHG as you would any other liquid soap.  You can apply chg directly  to the skin and wash                       Gently with a scrungie or clean washcloth.  5.  Apply the CHG Soap to your body ONLY FROM THE NECK DOWN.   Do not use on face/ open                           Wound or open sores. Avoid contact with eyes, ears mouth and genitals (private parts).                       Wash face,  Genitals (private parts) with your normal soap.             6.  Wash thoroughly, paying special attention to the area where your surgery  will be performed.  7.  Thoroughly rinse your body with warm water from the neck down.  8.  DO  NOT shower/wash with your normal soap after using and rinsing off  the CHG Soap.                9.  Pat yourself dry with a clean towel.            10.  Wear clean pajamas.            11.  Place clean sheets on your bed the night of your first shower and do not  sleep with pets. Day of Surgery : Do not apply any lotions/deodorants the morning of surgery.  Please wear clean clothes to the hospital/surgery center.  FAILURE TO FOLLOW THESE INSTRUCTIONS MAY RESULT IN THE CANCELLATION OF YOUR SURGERY PATIENT SIGNATURE_________________________________  NURSE SIGNATURE__________________________________  ________________________________________________________________________

## 2022-08-20 ENCOUNTER — Encounter (HOSPITAL_COMMUNITY): Payer: Self-pay

## 2022-08-20 ENCOUNTER — Encounter (HOSPITAL_COMMUNITY)
Admission: RE | Admit: 2022-08-20 | Discharge: 2022-08-20 | Disposition: A | Discharge status: Home / Self Care | Payer: PPO | Source: Ambulatory Visit | Attending: Urology | Admitting: Urology

## 2022-08-20 ENCOUNTER — Other Ambulatory Visit: Payer: Self-pay

## 2022-08-20 VITALS — BP 117/73 | HR 63 | Temp 97.8°F | Ht 69.0 in | Wt 148.0 lb

## 2022-08-20 DIAGNOSIS — I10 Essential (primary) hypertension: Secondary | ICD-10-CM | POA: Insufficient documentation

## 2022-08-20 DIAGNOSIS — Z01818 Encounter for other preprocedural examination: Secondary | ICD-10-CM | POA: Insufficient documentation

## 2022-08-20 LAB — BASIC METABOLIC PANEL
Anion gap: 8 (ref 5–15)
BUN: 25 mg/dL — ABNORMAL HIGH (ref 8–23)
CO2: 28 mmol/L (ref 22–32)
Calcium: 9.4 mg/dL (ref 8.9–10.3)
Chloride: 103 mmol/L (ref 98–111)
Creatinine, Ser: 1.58 mg/dL — ABNORMAL HIGH (ref 0.61–1.24)
GFR, Estimated: 44 mL/min — ABNORMAL LOW (ref >60)
Glucose, Bld: 110 mg/dL — ABNORMAL HIGH (ref 70–99)
Potassium: 4.4 mmol/L (ref 3.5–5.1)
Sodium: 139 mmol/L (ref 135–145)

## 2022-08-20 LAB — CBC
HCT: 47.1 % (ref 39.0–52.0)
Hemoglobin: 14.6 g/dL (ref 13.0–17.0)
MCH: 28.2 pg (ref 26.0–34.0)
MCHC: 31 g/dL (ref 30.0–36.0)
MCV: 91.1 fL (ref 80.0–100.0)
Platelets: 272 10*3/uL (ref 150–400)
RBC: 5.17 MIL/uL (ref 4.22–5.81)
RDW: 14.6 % (ref 11.5–15.5)
WBC: 8.5 10*3/uL (ref 4.0–10.5)
nRBC: 0 % (ref 0.0–0.2)

## 2022-08-20 NOTE — Progress Notes (Signed)
For Short Stay: [place] appointment date:  Bowel Prep reminder:   For Anesthesia: PCP - Dr. Alysia Penna. Cardiologist - Dr. Peter [name] Clearance: 08/16/22: Coletta Memos. Chest x-ray -  EKG - 07/06/22 Stress Test -  ECHO - 01/05/21 Cardiac Cath - 07/22/20 Pacemaker/ICD device last checked: 07/06/22 Pacemaker orders received: Yes. EPIC/Chart Device Rep notified:  Spinal Cord Stimulator:  Sleep Study -  CPAP -   Fasting Blood Sugar -  Checks Blood Sugar _____ times a day Date and result of last Hgb A1c-  Last dose of GLP1 agonist-  GLP1 instructions:   Last dose of SGLT-2 inhibitors-  SGLT-2 instructions:   Blood Thinner Instructions: Aspirin Instructions: Last Dose:  Activity level: Can go up a flight of stairs and activities of daily living without stopping and without chest pain and/or shortness of breath   Able to exercise without chest pain and/or shortness of breath   Unable to go up a flight of stairs without chest pain and/or shortness of breath     Anesthesia review: Hx: CHF,HTN  Patient denies shortness of breath, fever, cough and chest pain at PAT appointment   Patient verbalized understanding of instructions that were given to them at the PAT appointment. Patient was also instructed that they will need to review over the PAT instructions again at home before surgery.

## 2022-08-22 ENCOUNTER — Other Ambulatory Visit: Payer: Self-pay | Admitting: Cardiology

## 2022-08-23 ENCOUNTER — Telehealth: Payer: Self-pay | Admitting: Pharmacist

## 2022-08-23 NOTE — Progress Notes (Signed)
Anesthesia Chart Review   Case: [id number] Date/Time: 08/26/22 0700   Procedures:      CYSTOSCOPY - 90 MINUTES NEEDED FOR CASE  ANESTHESIA IS GENERAL OR SPINAL     TRANSURETHRAL RESECTION OF THE PROSTATE (TURP)   Anesthesia type: General   Pre-op diagnosis: URINARY RETENTION   Location: [place] / [facility] ORS   Surgeons: Raynelle Bring, MD       DISCUSSION:81 y.o. never smoker with h/o HTN, CHF EF 20-25%, pacemaker in place (device orders in 08/19/2022 progress note), throat cancer, urinary retention scheduled for above procedure 08/26/2022 with Dr. Raynelle Bring.   Pt last seen by cardiology 08/16/2022. Per OV note, "Chart reviewed as part of pre-operative protocol coverage. Given past medical history and time since last visit, based on ACC/AHA guidelines, Nicholas Wilkerson would be at acceptable risk for the planned procedure without further cardiovascular testing.    Patient was advised that if he develops new symptoms prior to surgery to contact our office to arrange a follow-up appointment.  He verbalized understanding. I will route this recommendation to the requesting party via Epic fax function and remove from pre-op pool.   His aspirin will be held for 5 days prior to his procedure.  Please resume as soon as hemostasis is achieved.  His RCRI is a class III risk, 6.6% risk of major cardiac event.  He is able to complete greater than 4 METS of physical activity."  Anticipate pt can proceed with planned procedure barring acute status change.   VS: BP 117/73   Pulse 63   Temp 36.6 C (Oral)   Ht '5\' 9"'$  (1.753 m)   Wt 67.1 kg   SpO2 100%   BMI 21.86 kg/m   PROVIDERS: Laurey Morale, MD is PCP   Cardiologist - Dr. Peter [name]  LABS: Labs reviewed: Acceptable for surgery. (all labs ordered are listed, but only abnormal results are displayed)  Labs Reviewed  BASIC METABOLIC PANEL - Abnormal; Notable for the following components:      Result Value   Glucose, Bld 110 (*)     BUN 25 (*)    Creatinine, Ser 1.58 (*)    GFR, Estimated 44 (*)    All other components within normal limits  CBC     IMAGES:   EKG:   CV: Echo 01/05/2021  1. Left ventricular ejection fraction, by estimation, is 20 to 25%. The  left ventricle has severely decreased function. The left ventricle  demonstrates global hypokinesis. The left ventricular internal cavity size  was mildly to moderately dilated. Left  ventricular diastolic parameters are consistent with Grade II diastolic  dysfunction (pseudonormalization). Elevated left atrial pressure.   2. Right ventricular systolic function is normal. The right ventricular  size is normal.   3. Left atrial size was mild to moderately dilated.   4. The mitral valve is normal in structure. Mild mitral valve  regurgitation.   5. The aortic valve is tricuspid. Aortic valve regurgitation is not  visualized. Mild aortic valve sclerosis is present, with no evidence of  aortic valve stenosis.   6. The inferior vena cava is normal in size with greater than 50%  respiratory variability, suggesting right atrial pressure of 3 mmHg.   Cardiac Cath 07/22/2020 Prox LAD to Mid LAD lesion is 25% stenosed. Prox Cx to Dist Cx lesion is 25% stenosed. Prox RCA to Mid RCA lesion is 15% stenosed. LV end diastolic pressure is moderately elevated. Hemodynamic findings consistent with moderate  pulmonary hypertension.   1. Nonobstructive CAD 2. Low cardiac output. Index 1.86 3. Moderately elevated LV filling pressures 4. Moderate Pulmonary HTN   Plan: optimize medical therapy for CHF. On Coreg. Just started Quinlan. Will add lasix 40 mg daily. Further titration of medication depending on initial response.  Past Medical History:  Diagnosis Date   ARTHRITIS 10/22/2008   BPH (benign prostatic hyperplasia)    CHF (congestive heart failure) ([place])    sees Dr. Peter [name]    Dilated cardiomyopathy [facility])    DIVERTICULOSIS, COLON 12/08/2007    Glaucoma    sees Dr. Heather [place]    H/O asbestos exposure    History of echocardiogram 05/22/2007   EF was 45-50% / Mild concentric LV hypertrophy with mild global hypokinesis and overall mild systolic dysfunction .  Mild AV sclerosis / Mild Mitral insufficiency / compared to prior study 04/24/02 -- LV function has improved further.     History of kidney stones    Hypercholesterolemia    Hypertension    Insomnia 06/11/2015   Radiation 01/01/15-02/18/15   base of tongue and bilateral neck 70 Gy   Skin cancer    squamous cell, basal cell   Squamous cell carcinoma 11/20/2014   base of tongue primary   Throat cancer ([place]) 10/2014   had chemo   Thyroid disease     Past Surgical History:  Procedure Laterality Date   CARDIAC CATHETERIZATION  10/17/2001   EF estimated at 30% / moderate LV enlargement  / 1. Minimal nonobstructive atherosclerotic coronary artery disease / 2. Severe LV dysfunction with global hypokinesia consistent with dilated nonischemic cardiomyopath / 3. Moderate pulmonary hypertension   COLONOSCOPY  06/23/2017   per Dr. Carlean Purl, clear, no repeats needed    ICD IMPLANT N/A 04/02/2021   Procedure: ICD IMPLANT;  Surgeon: [name], MD;  Location: [place] CV LAB;  Service: Cardiovascular;  Laterality: N/A;   LYMPH NODE BIOPSY     RIGHT/LEFT HEART CATH AND CORONARY ANGIOGRAPHY N/A 07/22/2020   Procedure: RIGHT/LEFT HEART CATH AND CORONARY ANGIOGRAPHY;  Surgeon: [name], [name], MD;  Location: [place] CV LAB;  Service: Cardiovascular;  Laterality: N/A;    MEDICATIONS:  aspirin EC 81 MG tablet   carvedilol (COREG) 25 MG tablet   dorzolamide-timolol (COSOPT) 22.3-6.8 MG/ML ophthalmic solution   empagliflozin (JARDIANCE) 10 MG TABS tablet   furosemide (LASIX) 40 MG tablet   levothyroxine (SYNTHROID) 75 MCG tablet   Melatonin 10 MG TABS   Multiple Vitamin (MULTIVITAMIN WITH MINERALS) TABS tablet   potassium chloride (KLOR-CON) 10 MEQ tablet   ROCKLATAN  0.02-0.005 % SOLN   rosuvastatin (CRESTOR) 5 MG tablet   sacubitril-valsartan (ENTRESTO) 49-51 MG   sodium fluoride (FLUORISHIELD) 1.1 % GEL dental gel   tamsulosin (FLOMAX) 0.4 MG CAPS capsule   triamcinolone cream (KENALOG) 0.1 %   No current facility-administered medications for this encounter.     Konrad Felix Ward, PA-C [facility] Pre-Surgical Testing [phone]

## 2022-08-23 NOTE — Chronic Care Management (AMB) (Signed)
Renewal application for [id number] Entresto pre filled to be mailed to patient with instructions for completion.   Patient Assistance: [employer]/ Entresto 2024 in process  [place] Pharmacist Assistant [phone]

## 2022-08-23 NOTE — Anesthesia Preprocedure Evaluation (Addendum)
Anesthesia Evaluation  Patient identified by MRN, date of birth, ID band Patient awake    Reviewed: Allergy & Precautions, NPO status , Patient's Chart, lab work & pertinent test results  History of Anesthesia Complications Negative for: history of anesthetic complications  Airway Mallampati: II  TM Distance: >3 FB Neck ROM: Full    Dental  (+) Dental Advisory Given   Pulmonary neg pulmonary ROS   Pulmonary exam normal        Cardiovascular hypertension, Pt. on medications and Pt. on home beta blockers + CAD (nonobstructive by 2021 cath) and +CHF (EF 20-25%)  Normal cardiovascular exam+ Cardiac Defibrillator   Echo 01/05/21: EF 20-25%, global hyopkinesis, g2dd, normal RVSF, mild MR, mild AV sclerosis w/o stenosis  Cath 07/22/20: nonobstructive CAD, low cardiac output, mod pulm HTN   Neuro/Psych negative neurological ROS     GI/Hepatic negative GI ROS, Neg liver ROS,,,  Endo/Other  Hypothyroidism    Renal/GU Renal InsufficiencyRenal disease (Cr 1.58)  negative genitourinary   Musculoskeletal negative musculoskeletal ROS (+)    Abdominal   Peds  Hematology negative hematology ROS (+)   Anesthesia Other Findings H/o base of tongue cancer in 2016 s/p XRT, had regular f/u with ENT with no evidence of recurrence after 5 years and no symptoms. Last laryngoscopy in 2019 was normal.  Reproductive/Obstetrics                             Anesthesia Physical Anesthesia Plan  ASA: 4  Anesthesia Plan: General   Post-op Pain Management: Tylenol PO (pre-op)*   Induction: Intravenous  PONV Risk Score and Plan: 2 and Ondansetron, Dexamethasone, Midazolam and Treatment may vary due to age or medical condition  Airway Management Planned: LMA  Additional Equipment: ClearSight  Intra-op Plan:   Post-operative Plan: Extubation in OR  Informed Consent: I have reviewed the patients History and  Physical, chart, labs and discussed the procedure including the risks, benefits and alternatives for the proposed anesthesia with the patient or authorized representative who has indicated his/her understanding and acceptance.     Dental advisory given  Plan Discussed with:   Anesthesia Plan Comments: (See PAT note 08/20/2022)       Anesthesia Quick Evaluation

## 2022-08-25 NOTE — H&P (Signed)
Office Visit Report     08/05/2022   --------------------------------------------------------------------------------   Nicholas Wilkerson  MRN: [id number]  DOB: 19-Jun-1941, 81 year old Male  SSN: [id number]   PRIMARY CARE:  Annie Main A. Sarajane Jews, MD  REFERRING:  Ishmael Holter. Sarajane Jews, MD  PROVIDER:  Raynelle Bring, M.D.  LOCATION:  [facility] [phone]     --------------------------------------------------------------------------------   CC/HPI: 1. Elevated PSA  2. Prostate nodule  3. BPH/urinary retention   Mr. Dehner returns today after having a catheter placed at his last visit. He follows up today after undergoing a urodynamic study and transrectal ultrasound volume measurement of his prostate. He did have a small median lobe although this was not particularly prominent intravesically. Prostate volume was measured at approximately 90 cc. Although without inclusion of his small median lobe, measured approximately 50 cc. For comparison, his volume was around 75 cc on his MR/ultrasound fusion biopsy in 2021. His urodynamic study was also reviewed today and does demonstrate preserved detrusor function consistent with obstruction. He has been tolerating his catheter relatively well with expected irritation around the glans penis. He follows up today for cystoscopy.   He does have significant congestive heart failure related to dilated cardiomyopathy that is nonobstructive. His ejection fraction is approximately 20 to 25% but has been stable. He follows with Dr. Peter [name]. He also has an ICD that was implanted by Dr. Curt Bears.     ALLERGIES: No Allergies    MEDICATIONS: Aspirin 81 mg tablet,chewable  Levothyroxine Sodium  Tamsulosin Hcl 0.4 mg capsule  Carvedilol 25 mg tablet  Dorzolamide Hcl  Entresto 49 mg-51 mg tablet  Furosemide 40 mg tablet  Jardiance 10 mg tablet  Latanoprost 100 % oil  Rosuvastatin Calcium 5 mg tablet     GU PSH: Complex cystometrogram, w/ void  pressure and urethral pressure profile studies, any technique - 08/04/2022 Complex Uroflow - 08/04/2022 Emg surf Electrd - 08/04/2022 Inject For cystogram - 08/04/2022 Intrabd voidng Press - 08/04/2022 Prostate Needle Biopsy - 2021, 2021     NON-GU PSH: Surgical Pathology, Gross And Microscopic Examination For Prostate Needle - 2021, 2021     GU PMH: Urinary Retention - 08/04/2022, - 07/14/2022 BPH w/LUTS - 07/14/2022, - 02/22/2022, - 01/13/2022, - 08/20/2021, - 04/24/2021, - 2021 Elevated PSA - 07/14/2022, - 08/20/2021, - 2022, - 2021 Nocturia - 02/22/2022, - 08/20/2021, - 04/24/2021, - 2021 Prostate nodule w/o LUTS - 2022, - 2021      PMH Notes:   1) Elevated PSA and abnormal DRE: He presented to me in February 2021 for a fluctuating and elevated PSA that had increased to 7.93 in January 2021. He had a paternal uncle who died of metastatic prostate cancer. He was also noted to have right base induration on his initial exam prompting a prostate biopsy in February 2021.   Feb 2021: 12 core biopsy - Atypia at left apex possibly HGPIN with some slight suspicion for possible intraductal carcinoma. I discussed this with Dr. Orene Desanctis (pathology) and the patient. We agreed to check an MRI of the prostate with a follow up biopsy at some point pending those results. Vol 107.4 cc  Jul 2021: MRI - 9 mm PI-RADS 4 lesion in right medial apex  Aug 2021: MR/US fusion biopsy - 0/26 cores positive, Benign, Vol 72.2 cc   2) BPH/LUTS:   NON-GU PMH: Congestive heart failure Glaucoma Hypercholesterolemia Hypertension Squamous cell carcinoma of skin of scalp and neck    FAMILY HISTORY: Aneurysm -  Mother Kidney Cancer - Father Prostate Cancer - Uncle stroke - Runs in Family    Notes: 4 daughters   SOCIAL HISTORY: Marital Status: Married Preferred Language: English; Ethnicity: Not Hispanic Or Latino; Race: White Current Smoking Status: Patient has never smoked.   Tobacco Use Assessment Completed: Used  Tobacco in last 30 days? Does not drink anymore.  Drinks 2 caffeinated drinks per day.    REVIEW OF SYSTEMS:    GU Review Male:   Patient denies frequent urination, hard to postpone urination, burning/ pain with urination, get up at night to urinate, leakage of urine, stream starts and stops, trouble starting your streams, and have to strain to urinate .  Gastrointestinal (Upper):   Patient denies nausea and vomiting.  Gastrointestinal (Lower):   Patient denies diarrhea and constipation.  Constitutional:   Patient denies fatigue, weight loss, night sweats, and fever.  Skin:   Patient denies skin rash/ lesion and itching.  Eyes:   Patient denies blurred vision and double vision.  Ears/ Nose/ Throat:   Patient denies sore throat and sinus problems.  Hematologic/Lymphatic:   Patient denies swollen glands and easy bruising.  Cardiovascular:   Patient denies leg swelling and chest pains.  Respiratory:   Patient denies cough and shortness of breath.  Endocrine:   Patient denies excessive thirst.  Musculoskeletal:   Patient denies back pain and joint pain.  Neurological:   Patient denies headaches and dizziness.  Psychologic:   Patient denies depression and anxiety.   VITAL SIGNS: None   MULTI-SYSTEM PHYSICAL EXAMINATION:    Constitutional: Well-nourished. No physical deformities. Normally developed. Good grooming.  Respiratory: No labored breathing, no use of accessory muscles. Clear.  Cardiovascular: Normal temperature, normal extremity pulses, no swelling, no varicosities. Regular rate and rhythm.  Neurologic / Psychiatric: Oriented to time, oriented to place, oriented to person. No depression, no anxiety, no agitation.     Complexity of Data:  Records Review:   Previous Patient Records  Urodynamics Review:   Review Urodynamics Tests  X-Ray Review: Prostate Ultrasound: Reviewed Films.     07/14/22 08/12/21 12/10/20 04/18/20  PSA  Total PSA 3.68 ng/mL 2.98 ng/mL 1.36 ng/mL 2.54 ng/mL     PROCEDURES:         Flexible Cystoscopy - 52000  Indication: Urinary retention Risks, benefits, and potential complications of the procedure were discussed with the patient including infection, bleeding, voiding discomfort, urinary retention, fever, chills, sepsis, and others. All questions were answered. Informed consent was obtained. Sterile technique and intraurethral analgesia were used.  Meatus:  Normal size. Normal location. Normal condition.  Urethra:  No strictures.  External Sphincter:  Normal.  Verumontanum:  Normal.  Prostate:  Prostatic urethra was approximately 2.5 cm in length. He did have expected lateral lobe hypertrophy. He did not have a significant intravesical median lobe but did have some intravesical component on his left lateral lobe.  Bladder Neck:  Non-obstructing.  Ureteral Orifices:  Normal location. Normal size. Normal shape. Effluxed clear urine.  Bladder:  No trabeculation. No tumors. Normal mucosa. No stones.      Chaperone: BS. The procedure was well-tolerated and without complications. Instructions were given to call the office immediately if questions or problems.   ASSESSMENT:      ICD-10 Details  1 GU:   BPH w/LUTS - N40.1   2   Urinary Retention - R33.8    PLAN:            Medications Stop Meds: Diazepam 10 mg  tablet 1 tablet PO 30-60 minutes prior to procedure  Start: 04/25/2020  Discontinue: 08/05/2022  - Reason: The medication cycle was completed.  Levofloxacin 750 mg tablet Please take one tablet the morning of your biopsy.  Start: 12/05/2019  Discontinue: 08/05/2022  - Reason: The medication cycle was completed.            Schedule Return Visit/Planned Activity: Other See Visit Notes             Note: Will call to schedule follow-up/surgery.          Document Letter(s):  Created for Patient: Clinical Summary         Notes:   1. Urinary retention: He does have preserved detrusor function with obstruction related to BPH. We did  discuss that he might be optimally treated with a bipolar transurethral resection of the prostate and reviewed that procedure in detail today. However, he does have significant congestive heart failure with an ejection fraction of 20 to 25%. However, this is related to nonobstructive dilated cardiomyopathy and has been relatively stable. We did discuss an alternative such as having him referred to one of my partners to assess his candidacy for a UroLift. Although his prostate may be slightly large for that, this still may be a reasonable option to avoid general or spinal anesthesia. I will reach out to Dr. [name], his cardiologist, to get an idea regarding his cardiac risk. Considering that his last echocardiogram appears to have been in the spring 2022, it may be that Dr. [name] would like to see him with a repeat echocardiogram before then to complete that risk assessment. Mr. Callender is anxious to have his catheter out but understands the necessity to evaluate the risks and benefits of various approaches.   2. He is currently scheduled for his routine follow-up in November but we will likely reschedule this once we have a plan for treating his urinary retention.   CC: Dr. Alysia Penna  Dr. Peter [name]        Next Appointment:      Next Appointment: 08/20/2022 10:00 AM    Appointment Type: Laboratory Appointment    Location: [facility] [phone]    Provider: Lab LAB    Reason for Visit: annual psa w reflex      * Signed by Raynelle Bring, M.D. on 08/06/22 at 7:31 AM (EDT)*     APPENDED NOTES:    Dr. [name] believes that Mr. Chow should be at reasonable risk to proceed with a TURP. Will plan to proceed.    * Signed by Raynelle Bring, M.D. on 08/06/22 at 7:32 AM (EDT)*

## 2022-08-26 ENCOUNTER — Ambulatory Visit (HOSPITAL_BASED_OUTPATIENT_CLINIC_OR_DEPARTMENT_OTHER): Payer: PPO | Admitting: Anesthesiology

## 2022-08-26 ENCOUNTER — Observation Stay (HOSPITAL_COMMUNITY)
Admission: RE | Admit: 2022-08-26 | Discharge: 2022-08-27 | Disposition: A | Discharge status: Home / Self Care | Payer: PPO | Attending: Urology | Admitting: Urology

## 2022-08-26 ENCOUNTER — Encounter (HOSPITAL_COMMUNITY)
Admission: RE | Disposition: A | Discharge status: Home / Self Care | Payer: Self-pay | Source: Home / Self Care | Attending: Urology

## 2022-08-26 ENCOUNTER — Other Ambulatory Visit: Payer: Self-pay

## 2022-08-26 ENCOUNTER — Ambulatory Visit (HOSPITAL_COMMUNITY): Payer: PPO | Admitting: Physician Assistant

## 2022-08-26 ENCOUNTER — Encounter (HOSPITAL_COMMUNITY): Payer: Self-pay | Admitting: Urology

## 2022-08-26 DIAGNOSIS — N320 Bladder-neck obstruction: Secondary | ICD-10-CM | POA: Diagnosis present

## 2022-08-26 DIAGNOSIS — I110 Hypertensive heart disease with heart failure: Secondary | ICD-10-CM

## 2022-08-26 DIAGNOSIS — R338 Other retention of urine: Secondary | ICD-10-CM | POA: Insufficient documentation

## 2022-08-26 DIAGNOSIS — Z7982 Long term (current) use of aspirin: Secondary | ICD-10-CM | POA: Insufficient documentation

## 2022-08-26 DIAGNOSIS — E039 Hypothyroidism, unspecified: Secondary | ICD-10-CM | POA: Diagnosis not present

## 2022-08-26 DIAGNOSIS — I509 Heart failure, unspecified: Secondary | ICD-10-CM | POA: Diagnosis not present

## 2022-08-26 DIAGNOSIS — Z85828 Personal history of other malignant neoplasm of skin: Secondary | ICD-10-CM | POA: Diagnosis not present

## 2022-08-26 DIAGNOSIS — Z79899 Other long term (current) drug therapy: Secondary | ICD-10-CM | POA: Insufficient documentation

## 2022-08-26 DIAGNOSIS — I2510 Atherosclerotic heart disease of native coronary artery without angina pectoris: Secondary | ICD-10-CM

## 2022-08-26 DIAGNOSIS — N138 Other obstructive and reflux uropathy: Secondary | ICD-10-CM

## 2022-08-26 DIAGNOSIS — N401 Enlarged prostate with lower urinary tract symptoms: Principal | ICD-10-CM | POA: Insufficient documentation

## 2022-08-26 DIAGNOSIS — E119 Type 2 diabetes mellitus without complications: Secondary | ICD-10-CM | POA: Insufficient documentation

## 2022-08-26 DIAGNOSIS — N403 Nodular prostate with lower urinary tract symptoms: Secondary | ICD-10-CM | POA: Diagnosis not present

## 2022-08-26 DIAGNOSIS — R339 Retention of urine, unspecified: Secondary | ICD-10-CM | POA: Diagnosis not present

## 2022-08-26 HISTORY — PX: CYSTOSCOPY: SHX5120

## 2022-08-26 HISTORY — PX: TRANSURETHRAL RESECTION OF PROSTATE: SHX73

## 2022-08-26 LAB — BASIC METABOLIC PANEL
Anion gap: 4 — ABNORMAL LOW (ref 5–15)
BUN: 27 mg/dL — ABNORMAL HIGH (ref 8–23)
CO2: 29 mmol/L (ref 22–32)
Calcium: 9 mg/dL (ref 8.9–10.3)
Chloride: 106 mmol/L (ref 98–111)
Creatinine, Ser: 1.58 mg/dL — ABNORMAL HIGH (ref 0.61–1.24)
GFR, Estimated: 44 mL/min — ABNORMAL LOW (ref >60)
Glucose, Bld: 123 mg/dL — ABNORMAL HIGH (ref 70–99)
Potassium: 4.2 mmol/L (ref 3.5–5.1)
Sodium: 139 mmol/L (ref 135–145)

## 2022-08-26 LAB — HEMOGLOBIN A1C
Hgb A1c MFr Bld: 6.1 % — ABNORMAL HIGH (ref 4.8–5.6)
Mean Plasma Glucose: 128.37 mg/dL

## 2022-08-26 LAB — GLUCOSE, CAPILLARY
Glucose-Capillary: 148 mg/dL — ABNORMAL HIGH (ref 70–99)
Glucose-Capillary: 153 mg/dL — ABNORMAL HIGH (ref 70–99)
Glucose-Capillary: 197 mg/dL — ABNORMAL HIGH (ref 70–99)

## 2022-08-26 SURGERY — CYSTOSCOPY
Anesthesia: General

## 2022-08-26 MED ORDER — HYDROCODONE-ACETAMINOPHEN 5-325 MG PO TABS: 1.0000 | ORAL_TABLET | ORAL | Status: DC | PRN

## 2022-08-26 MED ORDER — FUROSEMIDE 40 MG PO TABS
40.0000 mg | ORAL_TABLET | Freq: Every day | ORAL | Status: DC
  Administered 2022-08-27: 40 mg via ORAL
  Filled 2022-08-26: qty 1

## 2022-08-26 MED ORDER — DIPHENHYDRAMINE HCL 50 MG/ML IJ SOLN: 12.5000 mg | Freq: Four times a day (QID) | INTRAMUSCULAR | Status: DC | PRN

## 2022-08-26 MED ORDER — SACUBITRIL-VALSARTAN 49-51 MG PO TABS
1.0000 | ORAL_TABLET | Freq: Two times a day (BID) | ORAL | Status: DC
  Administered 2022-08-26 – 2022-08-27 (×2): 1 via ORAL
  Filled 2022-08-26 (×2): qty 1

## 2022-08-26 MED ORDER — LACTATED RINGERS IV SOLN: INTRAVENOUS | Status: DC

## 2022-08-26 MED ORDER — SODIUM CHLORIDE 0.9 % IR SOLN
3000.0000 mL | Status: DC
  Administered 2022-08-26 – 2022-08-27 (×3): 3000 mL

## 2022-08-26 MED ORDER — ORAL CARE MOUTH RINSE: 15.0000 mL | Freq: Once | OROMUCOSAL | Status: AC

## 2022-08-26 MED ORDER — INSULIN ASPART 100 UNIT/ML IJ SOLN
0.0000 [IU] | INTRAMUSCULAR | Status: DC
  Administered 2022-08-26 (×2): 3 [IU] via SUBCUTANEOUS
  Administered 2022-08-27: 2 [IU] via SUBCUTANEOUS

## 2022-08-26 MED ORDER — FENTANYL CITRATE (PF) 100 MCG/2ML IJ SOLN
INTRAMUSCULAR | Status: DC | PRN
  Administered 2022-08-26 (×2): 25 ug via INTRAVENOUS
  Administered 2022-08-26: 100 ug via INTRAVENOUS

## 2022-08-26 MED ORDER — CARVEDILOL 25 MG PO TABS
25.0000 mg | ORAL_TABLET | Freq: Two times a day (BID) | ORAL | Status: DC
  Administered 2022-08-26 – 2022-08-27 (×2): 25 mg via ORAL
  Filled 2022-08-26 (×2): qty 1

## 2022-08-26 MED ORDER — STERILE WATER FOR IRRIGATION IR SOLN
Status: DC | PRN
  Administered 2022-08-26: 1000 mL

## 2022-08-26 MED ORDER — MIDAZOLAM HCL 2 MG/2ML IJ SOLN
INTRAMUSCULAR | Status: AC
  Filled 2022-08-26: qty 2

## 2022-08-26 MED ORDER — ONDANSETRON HCL 4 MG/2ML IJ SOLN
INTRAMUSCULAR | Status: DC | PRN
  Administered 2022-08-26: 4 mg via INTRAVENOUS

## 2022-08-26 MED ORDER — FENTANYL CITRATE (PF) 100 MCG/2ML IJ SOLN
INTRAMUSCULAR | Status: AC
  Filled 2022-08-26: qty 2

## 2022-08-26 MED ORDER — POTASSIUM CHLORIDE ER 10 MEQ PO TBCR
10.0000 meq | EXTENDED_RELEASE_TABLET | Freq: Every day | ORAL | Status: DC
  Administered 2022-08-27: 10 meq via ORAL
  Filled 2022-08-26 (×2): qty 1

## 2022-08-26 MED ORDER — LIDOCAINE 20MG/ML (2%) 15 ML SYRINGE OPTIME
INTRAMUSCULAR | Status: DC | PRN
  Administered 2022-08-26: 80 mg via INTRAVENOUS

## 2022-08-26 MED ORDER — ONDANSETRON HCL 4 MG/2ML IJ SOLN: 4.0000 mg | INTRAMUSCULAR | Status: DC | PRN

## 2022-08-26 MED ORDER — CEFDINIR 300 MG PO CAPS: 300.0000 mg | ORAL_CAPSULE | Freq: Two times a day (BID) | ORAL | 0 refills | Status: DC

## 2022-08-26 MED ORDER — EPHEDRINE SULFATE (PRESSORS) 50 MG/ML IJ SOLN
INTRAMUSCULAR | Status: DC | PRN
  Administered 2022-08-26 (×6): 5 mg via INTRAVENOUS
  Administered 2022-08-26: 10 mg via INTRAVENOUS

## 2022-08-26 MED ORDER — CHLORHEXIDINE GLUCONATE 0.12 % MT SOLN
15.0000 mL | Freq: Once | OROMUCOSAL | Status: AC
  Administered 2022-08-26: 15 mL via OROMUCOSAL

## 2022-08-26 MED ORDER — SODIUM CHLORIDE 0.9 % IV SOLN: 250.0000 mL | INTRAVENOUS | Status: DC | PRN

## 2022-08-26 MED ORDER — DIPHENHYDRAMINE HCL 12.5 MG/5ML PO ELIX: 12.5000 mg | ORAL_SOLUTION | Freq: Four times a day (QID) | ORAL | Status: DC | PRN

## 2022-08-26 MED ORDER — AMISULPRIDE (ANTIEMETIC) 5 MG/2ML IV SOLN: 10.0000 mg | Freq: Once | INTRAVENOUS | Status: DC | PRN

## 2022-08-26 MED ORDER — ONDANSETRON HCL 4 MG/2ML IJ SOLN: 4.0000 mg | Freq: Once | INTRAMUSCULAR | Status: DC | PRN

## 2022-08-26 MED ORDER — NETARSUDIL-LATANOPROST 0.02-0.005 % OP SOLN: 1.0000 [drp] | Freq: Every day | OPHTHALMIC | Status: DC

## 2022-08-26 MED ORDER — SODIUM CHLORIDE 0.9 % IV SOLN
2.0000 g | INTRAVENOUS | Status: AC
  Administered 2022-08-26: 2 g via INTRAVENOUS
  Filled 2022-08-26: qty 20

## 2022-08-26 MED ORDER — ZOLPIDEM TARTRATE 5 MG PO TABS: 5.0000 mg | ORAL_TABLET | Freq: Every evening | ORAL | Status: DC | PRN

## 2022-08-26 MED ORDER — MELATONIN 5 MG PO TABS
5.0000 mg | ORAL_TABLET | Freq: Every day | ORAL | Status: DC
  Administered 2022-08-26: 5 mg via ORAL
  Filled 2022-08-26: qty 1

## 2022-08-26 MED ORDER — SODIUM CHLORIDE 0.9 % IV SOLN
1.0000 g | INTRAVENOUS | Status: DC
  Administered 2022-08-27: 1 g via INTRAVENOUS
  Filled 2022-08-26: qty 10

## 2022-08-26 MED ORDER — PROPOFOL 10 MG/ML IV BOLUS
INTRAVENOUS | Status: DC | PRN
  Administered 2022-08-26: 60 mg via INTRAVENOUS

## 2022-08-26 MED ORDER — SODIUM CHLORIDE 0.9% FLUSH: 3.0000 mL | INTRAVENOUS | Status: DC | PRN

## 2022-08-26 MED ORDER — SODIUM CHLORIDE 0.9 % IR SOLN
Status: DC | PRN
  Administered 2022-08-26: 36000 mL via INTRAVESICAL

## 2022-08-26 MED ORDER — DEXAMETHASONE SODIUM PHOSPHATE 10 MG/ML IJ SOLN
INTRAMUSCULAR | Status: DC | PRN
  Administered 2022-08-26: 8 mg via INTRAVENOUS

## 2022-08-26 MED ORDER — OXYCODONE HCL 5 MG/5ML PO SOLN: 5.0000 mg | Freq: Once | ORAL | Status: DC | PRN

## 2022-08-26 MED ORDER — FENTANYL CITRATE PF 50 MCG/ML IJ SOSY: 25.0000 ug | PREFILLED_SYRINGE | INTRAMUSCULAR | Status: DC | PRN

## 2022-08-26 MED ORDER — ROSUVASTATIN CALCIUM 5 MG PO TABS
2.5000 mg | ORAL_TABLET | Freq: Every day | ORAL | Status: DC
  Administered 2022-08-26: 2.5 mg via ORAL
  Filled 2022-08-26: qty 1

## 2022-08-26 MED ORDER — ORAL CARE MOUTH RINSE: 15.0000 mL | OROMUCOSAL | Status: DC | PRN

## 2022-08-26 MED ORDER — LEVOTHYROXINE SODIUM 50 MCG PO TABS
75.0000 ug | ORAL_TABLET | Freq: Every day | ORAL | Status: DC
  Administered 2022-08-27: 75 ug via ORAL
  Filled 2022-08-26: qty 1

## 2022-08-26 MED ORDER — DORZOLAMIDE HCL-TIMOLOL MAL 2-0.5 % OP SOLN
1.0000 [drp] | Freq: Two times a day (BID) | OPHTHALMIC | Status: DC
  Administered 2022-08-26 – 2022-08-27 (×2): 1 [drp] via OPHTHALMIC
  Filled 2022-08-26: qty 10

## 2022-08-26 MED ORDER — MIDAZOLAM HCL 5 MG/5ML IJ SOLN
INTRAMUSCULAR | Status: DC | PRN
  Administered 2022-08-26: 2 mg via INTRAVENOUS

## 2022-08-26 MED ORDER — OXYCODONE HCL 5 MG PO TABS: 5.0000 mg | ORAL_TABLET | Freq: Once | ORAL | Status: DC | PRN

## 2022-08-26 MED ORDER — DOCUSATE SODIUM 100 MG PO CAPS
100.0000 mg | ORAL_CAPSULE | Freq: Two times a day (BID) | ORAL | Status: DC
  Administered 2022-08-26: 100 mg via ORAL
  Filled 2022-08-26 (×3): qty 1

## 2022-08-26 MED ORDER — SODIUM CHLORIDE 0.9% FLUSH
3.0000 mL | Freq: Two times a day (BID) | INTRAVENOUS | Status: DC
  Administered 2022-08-26 – 2022-08-27 (×3): 3 mL via INTRAVENOUS

## 2022-08-26 MED ORDER — ACETAMINOPHEN 500 MG PO TABS
1000.0000 mg | ORAL_TABLET | Freq: Once | ORAL | Status: AC
  Administered 2022-08-26: 1000 mg via ORAL
  Filled 2022-08-26: qty 2

## 2022-08-26 SURGICAL SUPPLY — 23 items
BAG DRN RND TRDRP ANRFLXCHMBR (UROLOGICAL SUPPLIES) ×1
BAG URINE DRAIN 2000ML AR STRL (UROLOGICAL SUPPLIES) ×2 IMPLANT
BAG URO CATCHER STRL LF (MISCELLANEOUS) ×2 IMPLANT
CATH HEMA 3WAY 30CC 22FR COUDE (CATHETERS) IMPLANT
CATH URETL OPEN END 6FR 70 (CATHETERS) IMPLANT
CLOTH BEACON ORANGE TIMEOUT ST (SAFETY) ×2 IMPLANT
DRAPE FOOT SWITCH (DRAPES) ×2 IMPLANT
GLOVE SURG LX STRL 7.5 STRW (GLOVE) ×2 IMPLANT
GOWN STRL REUS W/ TWL XL LVL3 (GOWN DISPOSABLE) ×2 IMPLANT
GOWN STRL REUS W/TWL XL LVL3 (GOWN DISPOSABLE) ×1
GUIDEWIRE STR DUAL SENSOR (WIRE) ×2 IMPLANT
GUIDEWIRE ZIPWRE .038 STRAIGHT (WIRE) IMPLANT
HOLDER FOLEY CATH W/STRAP (MISCELLANEOUS) IMPLANT
KIT TURNOVER KIT A (KITS) IMPLANT
LOOP CUT BIPOLAR 24F LRG (ELECTROSURGICAL) IMPLANT
MANIFOLD NEPTUNE II (INSTRUMENTS) ×2 IMPLANT
PACK CYSTO (CUSTOM PROCEDURE TRAY) ×2 IMPLANT
PENCIL SMOKE EVACUATOR (MISCELLANEOUS) IMPLANT
SYR 30ML LL (SYRINGE) ×2 IMPLANT
SYR TOOMEY IRRIG 70ML (MISCELLANEOUS) ×1
SYRINGE TOOMEY IRRIG 70ML (MISCELLANEOUS) ×2 IMPLANT
TUBING CONNECTING 10 (TUBING) ×2 IMPLANT
TUBING UROLOGY SET (TUBING) ×2 IMPLANT

## 2022-08-26 NOTE — Anesthesia Postprocedure Evaluation (Signed)
Anesthesia Post Note  Patient: Nicholas Wilkerson  Procedure(s) Performed: CYSTOSCOPY TRANSURETHRAL RESECTION OF THE PROSTATE (TURP)     Patient location during evaluation: PACU Anesthesia Type: General Level of consciousness: awake and alert Pain management: pain level controlled Vital Signs Assessment: post-procedure vital signs reviewed and stable Respiratory status: spontaneous breathing, nonlabored ventilation and respiratory function stable Cardiovascular status: blood pressure returned to baseline and stable Postop Assessment: no apparent nausea or vomiting Anesthetic complications: no   No notable events documented.  Last Vitals:  Vitals:   08/26/22 0930 08/26/22 1030  BP: (!) 99/58 (!) 101/48  Pulse: 74 70  Resp: 18 15  Temp:    SpO2: 94% 96%    Last Pain:  Vitals:   08/26/22 1030  TempSrc:   PainSc: 0-No pain                 Lidia Collum

## 2022-08-26 NOTE — Discharge Instructions (Signed)

## 2022-08-26 NOTE — Anesthesia Procedure Notes (Signed)
Procedure Name: LMA Insertion Date/Time: 08/26/2022 7:34 AM  Performed by: Pilar Grammes, CRNAPre-anesthesia Checklist: Patient identified, Emergency Drugs available, Suction available, Patient being monitored and Timeout performed Patient Re-evaluated:Patient Re-evaluated prior to induction Oxygen Delivery Method: Circle system utilized Preoxygenation: Pre-oxygenation with 100% oxygen Induction Type: IV induction Ventilation: Mask ventilation without difficulty LMA: LMA with gastric port inserted LMA Size: 4.0 Number of attempts: 1 Comments: LMA secured

## 2022-08-26 NOTE — Transfer of Care (Signed)
Immediate Anesthesia Transfer of Care Note  Patient: Nicholas Wilkerson  Procedure(s) Performed: CYSTOSCOPY TRANSURETHRAL RESECTION OF THE PROSTATE (TURP)  Patient Location: PACU  Anesthesia Type:General  Level of Consciousness: awake, alert , oriented, and patient cooperative  Airway & Oxygen Therapy: Patient Spontanous Breathing and Patient connected to face mask oxygen  Post-op Assessment: Report given to RN and Post -op Vital signs reviewed and stable  Post vital signs: stable  Last Vitals:  Vitals Value Taken Time  BP 105/83 08/26/22 0900  Temp    Pulse 87 08/26/22 0900  Resp 18 08/26/22 0900  SpO2 100 % 08/26/22 0900  Vitals shown include unvalidated device data.  Last Pain:  Vitals:   08/26/22 0555  TempSrc: Oral         Complications: No notable events documented.

## 2022-08-26 NOTE — Progress Notes (Signed)
RN notified Anesthesia Dr. Christella Hartigan, patient did not take Carvedilol this morning, vital signs BP 111/81, HR 88.  Per Dr. Christella Hartigan, hold Carvedilol.

## 2022-08-26 NOTE — Interval H&P Note (Signed)
History and Physical Interval Note:  08/26/2022 6:57 AM  Nicholas Wilkerson  has presented today for surgery, with the diagnosis of URINARY RETENTION.  The various methods of treatment have been discussed with the patient and family. After consideration of risks, benefits and other options for treatment, the patient has consented to  Procedure(s) with comments: CYSTOSCOPY (N/A) - 90 MINUTES NEEDED FOR CASE  ANESTHESIA IS GENERAL OR SPINAL TRANSURETHRAL RESECTION OF THE PROSTATE (TURP) (N/A) as a surgical intervention.  The patient's history has been reviewed, patient examined, no change in status, stable for surgery.  I have reviewed the patient's chart and labs.  Questions were answered to the patient's satisfaction.     [employer]

## 2022-08-26 NOTE — Op Note (Signed)
Preoperative diagnosis: Bladder outlet obstruction secondary to BPH  Postoperative diagnosis:  Bladder outlet obstruction secondary to BPH  Procedure:  Cystoscopy Transurethral resection of the prostate  Surgeon: Pryor Curia. M.D.  Anesthesia: General  Complications: None  EBL: Minimal  Specimens: Prostate chips  Disposition of specimens: Pathology  Indication: Nicholas Wilkerson is a patient with bladder outlet obstruction secondary to benign prostatic hyperplasia. After reviewing the management options for treatment, he elected to proceed with the above surgical procedure(s). We have discussed the potential benefits and risks of the procedure, side effects of the proposed treatment, the likelihood of the patient achieving the goals of the procedure, and any potential problems that might occur during the procedure or recuperation. Informed consent has been obtained.  Description of procedure:  The patient was taken to the operating room and general anesthesia was induced.  The patient was placed in the dorsal lithotomy position, prepped and draped in the usual sterile fashion, and preoperative antibiotics were administered. A preoperative time-out was performed.   Cystourethroscopy was performed.  The patient's urethra was examined and demonstrated bilobar prostatic hypertrophy.   The bladder was then systematically examined in its entirety. There was no evidence of any bladder tumors, stones, or other mucosal pathology.  The ureteral orifices were identified and marked so as to be avoided during the procedure.  The prostate adenoma was then resected utilizing loop cautery resection with the monopolar/bipolar cutting loop.  The prostate adenoma from the bladder neck back to the verumontanum was resected beginning at the six o'clock position and then extended to include the right and left lobes of the prostate and anterior prostate. Care was taken not to resect distal to the  verumontanum.  Hemostasis was then achieved with the cautery and the bladder was emptied and reinspected with no significant bleeding noted at the end of the procedure.    A 3 way catheter was then placed into the bladder and placed on continuous bladder irrigation.  The patient appeared to tolerate the procedure well and without complications.  The patient was able to be awakened and transferred to the recovery unit in satisfactory condition.

## 2022-08-27 ENCOUNTER — Encounter (HOSPITAL_COMMUNITY): Payer: Self-pay | Admitting: Urology

## 2022-08-27 DIAGNOSIS — N401 Enlarged prostate with lower urinary tract symptoms: Secondary | ICD-10-CM | POA: Diagnosis not present

## 2022-08-27 LAB — BASIC METABOLIC PANEL
Anion gap: 5 (ref 5–15)
BUN: 25 mg/dL — ABNORMAL HIGH (ref 8–23)
CO2: 26 mmol/L (ref 22–32)
Calcium: 9.1 mg/dL (ref 8.9–10.3)
Chloride: 108 mmol/L (ref 98–111)
Creatinine, Ser: 1.28 mg/dL — ABNORMAL HIGH (ref 0.61–1.24)
GFR, Estimated: 57 mL/min — ABNORMAL LOW (ref >60)
Glucose, Bld: 121 mg/dL — ABNORMAL HIGH (ref 70–99)
Potassium: 4.4 mmol/L (ref 3.5–5.1)
Sodium: 139 mmol/L (ref 135–145)

## 2022-08-27 LAB — GLUCOSE, CAPILLARY
Glucose-Capillary: 118 mg/dL — ABNORMAL HIGH (ref 70–99)
Glucose-Capillary: 119 mg/dL — ABNORMAL HIGH (ref 70–99)
Glucose-Capillary: 108 mg/dL — ABNORMAL HIGH (ref 70–99)

## 2022-08-27 LAB — SURGICAL PATHOLOGY

## 2022-08-27 MED ORDER — PHENAZOPYRIDINE HCL 200 MG PO TABS: 200.0000 mg | ORAL_TABLET | Freq: Three times a day (TID) | ORAL | 0 refills | Status: DC | PRN

## 2022-08-27 NOTE — Plan of Care (Signed)
  Problem: Education: Goal: Knowledge of the prescribed therapeutic regimen will improve Outcome: Adequate for Discharge   Problem: Bowel/Gastric: Goal: Gastrointestinal status for postoperative course will improve Outcome: Adequate for Discharge   Problem: Health Behavior/Discharge Planning: Goal: Identification of resources available to assist in meeting health care needs will improve Outcome: Adequate for Discharge   Problem: Skin Integrity: Goal: Demonstration of wound healing without infection will improve Outcome: Adequate for Discharge   Problem: Urinary Elimination: Goal: Ability to avoid or minimize complications of infection will improve Outcome: Adequate for Discharge   Problem: Education: Goal: Ability to describe self-care measures that may prevent or decrease complications (Diabetes Survival Skills Education) will improve Outcome: Adequate for Discharge Goal: Individualized Educational Video(s) Outcome: Adequate for Discharge   Problem: Coping: Goal: Ability to adjust to condition or change in health will improve Outcome: Adequate for Discharge   Problem: Fluid Volume: Goal: Ability to maintain a balanced intake and output will improve Outcome: Adequate for Discharge   Problem: Health Behavior/Discharge Planning: Goal: Ability to identify and utilize available resources and services will improve Outcome: Adequate for Discharge Goal: Ability to manage health-related needs will improve Outcome: Adequate for Discharge   Problem: Metabolic: Goal: Ability to maintain appropriate glucose levels will improve Outcome: Adequate for Discharge   Problem: Nutritional: Goal: Maintenance of adequate nutrition will improve Outcome: Adequate for Discharge Goal: Progress toward achieving an optimal weight will improve Outcome: Adequate for Discharge   Problem: Skin Integrity: Goal: Risk for impaired skin integrity will decrease Outcome: Adequate for Discharge    Problem: Tissue Perfusion: Goal: Adequacy of tissue perfusion will improve Outcome: Adequate for Discharge   Problem: Education: Goal: Knowledge of General Education information will improve Description: Including pain rating scale, medication(s)/side effects and non-pharmacologic comfort measures Outcome: Adequate for Discharge   Problem: Health Behavior/Discharge Planning: Goal: Ability to manage health-related needs will improve Outcome: Adequate for Discharge   Problem: Clinical Measurements: Goal: Ability to maintain clinical measurements within normal limits will improve Outcome: Adequate for Discharge Goal: Will remain free from infection Outcome: Adequate for Discharge Goal: Diagnostic test results will improve Outcome: Adequate for Discharge Goal: Respiratory complications will improve Outcome: Adequate for Discharge Goal: Cardiovascular complication will be avoided Outcome: Adequate for Discharge   Problem: Activity: Goal: Risk for activity intolerance will decrease Outcome: Adequate for Discharge   Problem: Nutrition: Goal: Adequate nutrition will be maintained Outcome: Adequate for Discharge   Problem: Coping: Goal: Level of anxiety will decrease Outcome: Adequate for Discharge   Problem: Elimination: Goal: Will not experience complications related to bowel motility Outcome: Adequate for Discharge Goal: Will not experience complications related to urinary retention Outcome: Adequate for Discharge   Problem: Pain Managment: Goal: General experience of comfort will improve Outcome: Adequate for Discharge   Problem: Safety: Goal: Ability to remain free from injury will improve Outcome: Adequate for Discharge   Problem: Skin Integrity: Goal: Risk for impaired skin integrity will decrease Outcome: Adequate for Discharge

## 2022-08-27 NOTE — TOC Initial Note (Signed)
Transition of Care [facility]) - Initial/Assessment Note    Patient Details  Name: Nicholas Wilkerson MRN: [id number] Date of Birth: 03-27-1941  Transition of Care [facility]) CM/SW Contact:    Leeroy Cha, RN Phone Number: 08/27/2022, 8:01 AM  Clinical Narrative:                  Transition of Care [facility]) Screening Note   Patient Details  Name: Nicholas Wilkerson Date of Birth: 06/16/1941   Transition of Care [facility]) CM/SW Contact:    Leeroy Cha, RN Phone Number: 08/27/2022, 8:01 AM    Transition of Care Department [facility]) has reviewed patient and no TOC needs have been identified at this time. We will continue to monitor patient advancement through interdisciplinary progression rounds. If new patient transition needs arise, please place a TOC consult.    Expected Discharge Plan: Home/Self Care Barriers to Discharge: Continued Medical Work up   Patient Goals and CMS Choice Patient states their goals for this hospitalization and ongoing recovery are:: to return home and be well CMS Medicare.gov Compare Post Acute Care list provided to:: Patient    Expected Discharge Plan and Services Expected Discharge Plan: Home/Self Care   Discharge Planning Services: CM Consult   Living arrangements for the past 2 months: Single Family Home                                      Prior Living Arrangements/Services Living arrangements for the past 2 months: Single Family Home Lives with:: Spouse Patient language and need for interpreter reviewed:: Yes Do you feel safe going back to the place where you live?: Yes            Criminal Activity/Legal Involvement Pertinent to Current Situation/Hospitalization: No - Comment as needed  Activities of Daily Living Home Assistive Devices/Equipment: Eyeglasses ADL Screening (condition at time of admission) Patient's cognitive ability adequate to safely complete daily activities?: Yes Is the patient deaf or have difficulty  hearing?: No Does the patient have difficulty seeing, even when wearing glasses/contacts?: No Does the patient have difficulty concentrating, remembering, or making decisions?: No Patient able to express need for assistance with ADLs?: Yes Does the patient have difficulty dressing or bathing?: No Independently performs ADLs?: Yes (appropriate for developmental age) Does the patient have difficulty walking or climbing stairs?: No Weakness of Legs: None Weakness of Arms/Hands: None  Permission Sought/Granted                  Emotional Assessment Appearance:: Appears stated age Attitude/Demeanor/Rapport: Engaged Affect (typically observed): Calm Orientation: : Oriented to Self, Oriented to Place, Oriented to  Time, Oriented to Situation Alcohol / Substance Use: Never Used Psych Involvement: No (comment)  Admission diagnosis:  Bladder outlet obstruction [N32.0] Patient Active Problem List   Diagnosis Date Noted   Bladder outlet obstruction 08/26/2022   Left arm swelling 04/21/2022   Chronic left-sided low back pain with left-sided sciatica 09/14/2021   ICD (implantable cardioverter-defibrillator) in place 07/16/2021   Constipation 04/08/2021   COVID-19 virus infection 03/16/2021   Elevated PSA 02/28/2020   Hypothyroidism 12/13/2019   Nonischemic cardiomyopathy ([place]) 09/28/2019   Personal history of malignant neoplasm of tongue 12/15/2015   Insomnia 06/11/2015   Skin infection at gastrostomy tube site [facility]) 06/09/2015   Depression, acute 03/24/2015   Palpitations 01/24/2015   History of skin cancer 12/03/2014   Cancer of base  of tongue ([place]) 12/02/2014   Acute on chronic systolic CHF (congestive heart failure) ([place]) 07/12/2013   MUSCLE STRAIN, ABDOMINAL WALL 11/28/2008   ARTHRITIS 10/22/2008   BURSITIS, LEFT SHOULDER 07/24/2008   Internal hemorrhoids 12/08/2007   EXTERNAL HEMORRHOIDS 12/08/2007   DIVERTICULOSIS, COLON 12/08/2007   RECTAL BLEEDING 12/08/2007   History  of cardiovascular disorder 12/08/2007   Congestive dilated cardiomyopathy ([place]) 06/13/2007   HX, PERSONAL, ARTHRITIS 06/13/2007   Hypercholesterolemia 05/15/2007   Essential hypertension 05/15/2007   PCP:  Laurey Morale, MD Pharmacy:   [facility] ([place]), [place] [address] [place]) [place] [address] Phone: [phone] Fax: [place] [address] Phone: [phone] Fax: [phone]  [facility], [place] [address] [place] [address] Phone: [phone] Fax: [phone]     Social Determinants of Health ([place]) Interventions    Readmission Risk Interventions   No data to display

## 2022-08-27 NOTE — Progress Notes (Signed)
AVS given to patient and wife, verbalized understanding. PIV removed, tele removed. Patient safely transported to main entrance to be taken home by his wife.

## 2022-08-27 NOTE — TOC Transition Note (Signed)
Transition of Care [facility]) - CM/SW Discharge Note   Patient Details  Name: Nicholas Wilkerson MRN: [id number] Date of Birth: 09-Jul-1941  Transition of Care [facility]) CM/SW Contact:  Leeroy Cha, RN Phone Number: 08/27/2022, 1:05 PM   Clinical Narrative:    Patient discharged to return home with self care.  No toc needs present.   Final next level of care: Home/Self Care Barriers to Discharge: Barriers Resolved   Patient Goals and CMS Choice Patient states their goals for this hospitalization and ongoing recovery are:: to return home and be well CMS Medicare.gov Compare Post Acute Care list provided to:: Patient    Discharge Placement                       Discharge Plan and Services   Discharge Planning Services: CM Consult                                 Social Determinants of Health (SDOH) Interventions     Readmission Risk Interventions   No data to display

## 2022-08-27 NOTE — Discharge Summary (Signed)
  Date of admission: 08/26/2022  Date of discharge: 08/27/2022  Admission diagnosis: Urinary retention due to BPH  Discharge diagnosis: Urinary retention due to BPH  Secondary diagnoses: Congestive heart failure, hypothyroidism, hypertension, diabetes  History and Physical: For full details, please see admission history and physical. Briefly, Nicholas Wilkerson is a 81 y.o. year old patient with urinary retention due to bladder outlet obstruction/BPH.   Hospital Course: He underwent a TURP on 08/26/22.  He was maintained on CBI ovenight.  He was stable the following morning and his CBI was able to be discontinued.  His catheter was removed and he passed a voiding trial and was able to be discharged home.  Laboratory values: No results for input(s): "HGB", "HCT" in the last 72 hours. Recent Labs    08/26/22 1042 08/27/22 0341  CREATININE 1.58* 1.28*    Disposition: Home  Discharge instruction: The patient was instructed to be ambulatory but told to refrain from heavy lifting, strenuous activity, or driving.   Discharge medications:  Allergies as of 08/27/2022   No Known Allergies      Medication List     STOP taking these medications    aspirin EC 81 MG tablet   multivitamin with minerals Tabs tablet   tamsulosin 0.4 MG Caps capsule Commonly known as: FLOMAX   triamcinolone cream 0.1 % Commonly known as: KENALOG       TAKE these medications    carvedilol 25 MG tablet Commonly known as: COREG TAKE 1 TABLET BY MOUTH TWICE DAILY WITH A MEAL . APPOINTMENT REQUIRED FOR FUTURE REFILLS   cefdinir 300 MG capsule Commonly known as: OMNICEF Take 1 capsule (300 mg total) by mouth 2 (two) times daily.   dorzolamide-timolol 2-0.5 % ophthalmic solution Commonly known as: COSOPT Place 1 drop into both eyes 2 (two) times daily.   empagliflozin 10 MG Tabs tablet Commonly known as: Jardiance Take 1 tablet (10 mg total) by mouth daily before breakfast.   Entresto 49-51  MG Generic drug: sacubitril-valsartan Take 1 tablet by mouth 2 (two) times daily.   furosemide 40 MG tablet Commonly known as: LASIX Take 1 tablet (40 mg total) by mouth daily.   levothyroxine 75 MCG tablet Commonly known as: SYNTHROID TAKE 1 TABLET BY MOUTH ONCE DAILY 6 IN THE MORNING . APPOINTMENT REQUIRED FOR FUTURE REFILLS   Melatonin 10 MG Tabs Take 20 mg by mouth at bedtime.   potassium chloride 10 MEQ tablet Commonly known as: KLOR-CON Take 1 tablet (10 mEq total) by mouth daily.   Rocklatan 0.02-0.005 % Soln Generic drug: Netarsudil-Latanoprost Place 1 drop into both eyes at bedtime.   rosuvastatin 5 MG tablet Commonly known as: Crestor Take 1 tablet (5 mg total) by mouth every other day. What changed:  how much to take when to take this   sodium fluoride 1.1 % Gel dental gel Commonly known as: FLUORISHIELD PLACE 1 APPLICATION ONTO THE TEETH AT BEDTIME        Followup:   Follow-up Information     Raynelle Bring, MD Follow up.   Specialty: Urology Why: 09/14/22 at 10:45 AM Contact information: [place] [address] [phone]

## 2022-08-31 ENCOUNTER — Other Ambulatory Visit: Payer: Self-pay | Admitting: *Deleted

## 2022-08-31 MED ORDER — EMPAGLIFLOZIN 10 MG PO TABS: 10.0000 mg | ORAL_TABLET | Freq: Every day | ORAL | 0 refills | Status: DC

## 2022-09-01 ENCOUNTER — Other Ambulatory Visit: Payer: Self-pay | Admitting: Internal Medicine

## 2022-09-01 ENCOUNTER — Telehealth: Payer: Self-pay | Admitting: Internal Medicine

## 2022-09-01 ENCOUNTER — Encounter: Payer: Self-pay | Admitting: Internal Medicine

## 2022-09-01 ENCOUNTER — Ambulatory Visit (INDEPENDENT_AMBULATORY_CARE_PROVIDER_SITE_OTHER): Payer: PPO | Admitting: Internal Medicine

## 2022-09-01 VITALS — BP 98/58 | HR 70 | Temp 97.7°F | Ht 69.0 in | Wt 148.7 lb

## 2022-09-01 DIAGNOSIS — R070 Pain in throat: Secondary | ICD-10-CM

## 2022-09-01 MED ORDER — NYSTATIN 100000 UNIT/ML MT SUSP: 5.0000 mL | Freq: Three times a day (TID) | OROMUCOSAL | 1 refills | Status: DC

## 2022-09-01 NOTE — Progress Notes (Signed)
Established Patient Office Visit     CC/Reason for Visit: Tongue and throat pain  HPI: Nicholas Wilkerson is a 81 y.o. male who is coming in today for the above mentioned reasons.  Last week he had prostate surgery under general anesthesia.  For the past couple days he has been experiencing tongue and throat pain.  No difficulty breathing.   Past Medical/Surgical History: Past Medical History:  Diagnosis Date   ARTHRITIS 10/22/2008   BPH (benign prostatic hyperplasia)    CHF (congestive heart failure) ([place])    sees Dr. Peter [name]    Dilated cardiomyopathy [facility])    DIVERTICULOSIS, COLON 12/08/2007   Glaucoma    sees Dr. Heather [place]    H/O asbestos exposure    History of echocardiogram 05/22/2007   EF was 45-50% / Mild concentric LV hypertrophy with mild global hypokinesis and overall mild systolic dysfunction .  Mild AV sclerosis / Mild Mitral insufficiency / compared to prior study 04/24/02 -- LV function has improved further.     History of kidney stones    Hypercholesterolemia    Hypertension    Insomnia 06/11/2015   Radiation 01/01/15-02/18/15   base of tongue and bilateral neck 70 Gy   Skin cancer    squamous cell, basal cell   Squamous cell carcinoma 11/20/2014   base of tongue primary   Throat cancer ([place]) 10/2014   had chemo   Thyroid disease     Past Surgical History:  Procedure Laterality Date   CARDIAC CATHETERIZATION  10/17/2001   EF estimated at 30% / moderate LV enlargement  / 1. Minimal nonobstructive atherosclerotic coronary artery disease / 2. Severe LV dysfunction with global hypokinesia consistent with dilated nonischemic cardiomyopath / 3. Moderate pulmonary hypertension   COLONOSCOPY  06/23/2017   per Dr. Carlean Purl, clear, no repeats needed    CYSTOSCOPY N/A 08/26/2022   Procedure: CYSTOSCOPY;  Surgeon: Raynelle Bring, MD;  Location: [facility];  Service: Urology;  Laterality: N/A;  90 MINUTES NEEDED FOR CASE  ANESTHESIA IS GENERAL OR SPINAL   ICD  IMPLANT N/A 04/02/2021   Procedure: ICD IMPLANT;  Surgeon: [name], MD;  Location: [place] [facility];  Service: Cardiovascular;  Laterality: N/A;   LYMPH NODE BIOPSY     RIGHT/LEFT HEART CATH AND CORONARY ANGIOGRAPHY N/A 07/22/2020   Procedure: RIGHT/LEFT HEART CATH AND CORONARY ANGIOGRAPHY;  Surgeon: [name], [name], MD;  Location: [place] [facility];  Service: Cardiovascular;  Laterality: N/A;   TRANSURETHRAL RESECTION OF PROSTATE N/A 08/26/2022   Procedure: TRANSURETHRAL RESECTION OF THE PROSTATE (TURP);  Surgeon: Raynelle Bring, MD;  Location: [facility];  Service: Urology;  Laterality: N/A;    Social History:  reports that he has never smoked. He has never used smokeless tobacco. He reports current alcohol use. He reports that he does not use drugs.  Allergies: No Known Allergies  Family History:  Family History  Problem Relation Age of Onset   Stroke Father    Cancer Father        kidney ca   Angina Mother    Colon cancer Neg Hx    Esophageal cancer Neg Hx    Rectal cancer Neg Hx    Stomach cancer Neg Hx      Current Outpatient Medications:    carvedilol (COREG) 25 MG tablet, TAKE 1 TABLET BY MOUTH TWICE DAILY WITH A MEAL . APPOINTMENT REQUIRED FOR FUTURE REFILLS, Disp: 180 tablet, Rfl: 0   cefdinir (OMNICEF) 300  MG capsule, Take 1 capsule (300 mg total) by mouth 2 (two) times daily., Disp: 10 capsule, Rfl: 0   dorzolamide-timolol (COSOPT) 22.3-6.8 MG/ML ophthalmic solution, Place 1 drop into both eyes 2 (two) times daily., Disp: , Rfl:    empagliflozin (JARDIANCE) 10 MG TABS tablet, Take 1 tablet (10 mg total) by mouth daily before breakfast., Disp: 35 tablet, Rfl: 0   furosemide (LASIX) 40 MG tablet, Take 1 tablet (40 mg total) by mouth daily., Disp: 90 tablet, Rfl: 3   levothyroxine (SYNTHROID) 75 MCG tablet, TAKE 1 TABLET BY MOUTH ONCE DAILY 6 IN THE MORNING . APPOINTMENT REQUIRED FOR FUTURE REFILLS, Disp: 90 tablet, Rfl: 3   magic mouthwash (nystatin,  lidocaine, diphenhydrAMINE, alum & mag hydroxide) suspension, Swish and swallow 5 mLs 3 (three) times daily., Disp: 180 mL, Rfl: 1   Melatonin 10 MG TABS, Take 20 mg by mouth at bedtime., Disp: , Rfl:    potassium chloride (KLOR-CON) 10 MEQ tablet, Take 1 tablet (10 mEq total) by mouth daily., Disp: 30 tablet, Rfl: 3   ROCKLATAN 0.02-0.005 % SOLN, Place 1 drop into both eyes at bedtime., Disp: , Rfl:    rosuvastatin (CRESTOR) 5 MG tablet, Take 1 tablet (5 mg total) by mouth every other day. (Patient taking differently: Take 2.5 mg by mouth daily.), Disp: 45 tablet, Rfl: 3   sacubitril-valsartan (ENTRESTO) 49-51 MG, Take 1 tablet by mouth 2 (two) times daily., Disp: 180 tablet, Rfl: 3   sodium fluoride (FLUORISHIELD) 1.1 % GEL dental gel, PLACE 1 APPLICATION ONTO THE TEETH AT BEDTIME, Disp: 112 mL, Rfl: 11  Review of Systems:  Constitutional: Denies fever, chills, diaphoresis, appetite change and fatigue.  HEENT: Denies photophobia, eye pain, redness, hearing loss, ear pain, congestion, sore throat, rhinorrhea, sneezing,  neck pain, neck stiffness and tinnitus.   Respiratory: Denies SOB, DOE, cough, chest tightness,  and wheezing.   Cardiovascular: Denies chest pain, palpitations and leg swelling.  Gastrointestinal: Denies nausea, vomiting, abdominal pain, diarrhea, constipation, blood in stool and abdominal distention.  Genitourinary: Denies dysuria, urgency, frequency, hematuria, flank pain and difficulty urinating.  Endocrine: Denies: hot or cold intolerance, sweats, changes in hair or nails, polyuria, polydipsia. Musculoskeletal: Denies myalgias, back pain, joint swelling, arthralgias and gait problem.  Skin: Denies pallor, rash and wound.  Neurological: Denies dizziness, seizures, syncope, weakness, light-headedness, numbness and headaches.  Hematological: Denies adenopathy. Easy bruising, personal or family bleeding history  Psychiatric/Behavioral: Denies suicidal ideation, mood changes,  confusion, nervousness, sleep disturbance and agitation    Physical Exam: Vitals:   09/01/22 1318  BP: (!) 98/58  Pulse: 70  Temp: 97.7 F (36.5 C)  TempSrc: Oral  SpO2: 99%  Weight: 148 lb 11.2 oz (67.4 kg)  Height: '5\' 9"'$  (1.753 m)    Body mass index is 21.96 kg/m.   Constitutional: NAD, calm, comfortable Eyes: PERRL, lids and conjunctivae normal, wears corrective lenses ENMT: Mucous membranes are moist. Posterior pharynx is erythematous but clear of any   Psychiatric: Normal judgment and insight. Alert and oriented x 3. Normal mood.    Impression and Plan:  Throat pain - Plan: magic mouthwash (nystatin, lidocaine, diphenhydrAMINE, alum & mag hydroxide) suspension  -Suspect tongue and throat pain from irritation from ET tube after recent surgery with general anesthesia. -Plan Magic mouthwash to use 3 times daily.  Prescription sent.  Time spent:30 minutes reviewing chart, interviewing and examining patient and formulating plan of care.       Lelon Frohlich, MD [facility]  at [facility]

## 2022-09-01 NOTE — Telephone Encounter (Signed)
Glady pharm tech [facility] is calling and they no longer do compound medication and pt would like to know if there's alternative

## 2022-09-01 NOTE — Telephone Encounter (Signed)
Dr Jerilee Hoh was informed of [facility] which is a Journalist, newspaper and stated she will send in the Rx.  I spoke with the patient and informed him of this also.

## 2022-09-07 ENCOUNTER — Telehealth: Payer: Self-pay | Admitting: Family Medicine

## 2022-09-07 NOTE — Telephone Encounter (Signed)
Spoke with pharmacist.  Patient is aware.

## 2022-09-07 NOTE — Telephone Encounter (Signed)
Pharmacy has not filled this because they need more info on how to formulate magic mouthwash (nystatin, lidocaine, diphenhydrAMINE, alum & mag hydroxide) suspension pt says he has been waiting almost a week

## 2022-09-08 ENCOUNTER — Telehealth: Payer: Self-pay | Admitting: Pharmacist

## 2022-09-08 NOTE — Chronic Care Management (AMB) (Signed)
Call to patient in regard to his Rosuvastatin 5 mg needing to be filled. Patient reports he still has some medication on hand as he had issues since starting it so decreased himself to half a tab (2.5 mg) every other day and has recently started half tab (2.5) daily so he still has quite a bit of medication on hand. Patient further reports his [place] drops he has been prescribed are unaffordable to him and he wanted to know if there were any patient assistance options for this medication, advised him there were not to my knowledge but would forward to the pharmacist as he is to follow up with her in the next coming weeks.   [place] Clinical Pharmacist Assistant [phone]

## 2022-09-09 ENCOUNTER — Telehealth: Payer: Self-pay

## 2022-09-09 NOTE — Telephone Encounter (Signed)
[employer] Patient assistance forms for [name] completed and faxed to [phone].

## 2022-09-10 ENCOUNTER — Telehealth: Payer: Self-pay | Admitting: Pharmacist

## 2022-09-10 NOTE — Chronic Care Management (AMB) (Signed)
    Chronic Care Management Pharmacy Assistant   Name: Nicholas Wilkerson  MRN: [id number] DOB: 25-Jul-1941  09/10/22 APPOINTMENT REMINDER   Called Patient No answer, left message of appointment on 09/13/22  at 12 via telephone visit with [name], Pharm D.   Notified to have all medications, supplements, blood pressure and/or blood sugar logs available during appointment and to return call if need to reschedule.   Care Gaps: TDAP - Overdue COVID Booster - Overdue Hepatitis C  - Postponed Flu Vaccine - Postponed  Star Rating Drug:  Rosuvastatin 5 mg - Last filled 05/30/22 90 DS at [facility] Verified       Medications: Outpatient Encounter Medications as of 09/10/2022  Medication Sig   carvedilol (COREG) 25 MG tablet TAKE 1 TABLET BY MOUTH TWICE DAILY WITH A MEAL . APPOINTMENT REQUIRED FOR FUTURE REFILLS   cefdinir (OMNICEF) 300 MG capsule Take 1 capsule (300 mg total) by mouth 2 (two) times daily.   dorzolamide-timolol (COSOPT) 22.3-6.8 MG/ML ophthalmic solution Place 1 drop into both eyes 2 (two) times daily.   empagliflozin (JARDIANCE) 10 MG TABS tablet Take 1 tablet (10 mg total) by mouth daily before breakfast.   furosemide (LASIX) 40 MG tablet Take 1 tablet (40 mg total) by mouth daily.   levothyroxine (SYNTHROID) 75 MCG tablet TAKE 1 TABLET BY MOUTH ONCE DAILY 6 IN THE MORNING . APPOINTMENT REQUIRED FOR FUTURE REFILLS   magic mouthwash (nystatin, lidocaine, diphenhydrAMINE, alum & mag hydroxide) suspension Swish and swallow 5 mLs 3 (three) times daily.   Melatonin 10 MG TABS Take 20 mg by mouth at bedtime.   potassium chloride (KLOR-CON) 10 MEQ tablet Take 1 tablet (10 mEq total) by mouth daily.   ROCKLATAN 0.02-0.005 % SOLN Place 1 drop into both eyes at bedtime.   rosuvastatin (CRESTOR) 5 MG tablet Take 1 tablet (5 mg total) by mouth every other day. (Patient taking differently: Take 2.5 mg by mouth daily.)   sacubitril-valsartan (ENTRESTO) 49-51 MG Take 1 tablet by mouth  2 (two) times daily.   sodium fluoride (FLUORISHIELD) 1.1 % GEL dental gel PLACE 1 APPLICATION ONTO THE TEETH AT BEDTIME   No facility-administered encounter medications on file as of 09/10/2022.     [place] Clinical Pharmacist Assistant [phone]

## 2022-09-13 ENCOUNTER — Other Ambulatory Visit: Payer: Self-pay | Admitting: Pharmacist

## 2022-09-13 ENCOUNTER — Ambulatory Visit (INDEPENDENT_AMBULATORY_CARE_PROVIDER_SITE_OTHER): Payer: PPO | Admitting: Pharmacist

## 2022-09-13 DIAGNOSIS — I10 Essential (primary) hypertension: Secondary | ICD-10-CM

## 2022-09-13 DIAGNOSIS — I5022 Chronic systolic (congestive) heart failure: Secondary | ICD-10-CM

## 2022-09-13 NOTE — Progress Notes (Signed)
Chronic Care Management Pharmacy Note  09/13/2022 Name:  Nicholas Wilkerson MRN:  [id number] DOB:  07/27/1941  Summary: BP at goal < 130/80 but sometimes low without symptoms LDL at goal < 70 Pt is tolerating rosuvastatin now   Recommendations/Changes made from today's visit: -Recommend continued routine BP monitoring at home -Recommend repeat lipid panel -Requested refill for Entresto from cardiology   Plan: Scheduled CPE in 1 month for repeat lipid panel Follow up in 6 months  Subjective: Nicholas Wilkerson is an 81 y.o. year old male who is a primary patient of Laurey Morale, MD.  The CCM team was consulted for assistance with disease management and care coordination needs.    Engaged with patient face to face for follow up visit in response to provider referral for pharmacy case management and/or care coordination services.   Consent to Services:  The patient was given information about Chronic Care Management services, agreed to services, and gave verbal consent prior to initiation of services.  Please see initial visit note for detailed documentation.   Patient Care Team: Laurey Morale, MD as PCP - General (Family Medicine) [name], MD as PCP - Electrophysiology (Cardiology) [name], [name], MD as PCP - Cardiology (Cardiology) Heath Lark, MD as Consulting Physician (Hematology and Oncology) Melissa Montane, MD as Consulting Physician (Otolaryngology) Eppie Gibson, MD as Attending Physician (Radiation Oncology) Holley Bouche, NP (Inactive) as Nurse Practitioner (Nurse Practitioner) [name], [name], MD as Consulting Physician (Cardiology) [place], Heather, [place] (Optometry) Viona Gilmore, [facility] as Pharmacist (Pharmacist)  Recent office visits: 09/01/22 Domingo Mend, MD: Patient presented for throat pain. Prescribed magic mouthwash.  04/21/22 Alysia Penna, MD: Patient presented for arm swelling follow up. Plan for chest CT to rule out thoracic outlet  syndrome.  Recent consult visits: 08/16/22 Coletta Memos, NP (Cardiology): Patient presented for preop cardiovascular exam. Recommended holding aspirin 5 days prior to procedure. Please restart after surgery.  07/06/22 Will Curt Bears, MD (Cardiology): Patient presented for CHF follow up. No medication changes. Follow up in 12 months.  07/05/22 Peter [name], MD (Cardiology): Patient presented for CHF follow up. Follow up in 6 months.   03/19/22 Patient presented to [facility] for UE Venous.   Hospital visits: 11/16-11/17/23 Patient admitted to [facility] for 32 hours for cystoscopy and TURP procedure. D/c'd tamsulosin, multivitamin, aspirin and triamcinolone cream.    Objective:  Lab Results  Component Value Date   CREATININE 1.28 (H) 08/27/2022   BUN 25 (H) 08/27/2022   GFR 35.11 (L) 04/21/2022   GFRNONAA 57 (L) 08/27/2022   GFRAA 63 10/07/2020   NA 139 08/27/2022   K 4.4 08/27/2022   CALCIUM 9.1 08/27/2022   CO2 26 08/27/2022   GLUCOSE 121 (H) 08/27/2022    Lab Results  Component Value Date/Time   HGBA1C 6.1 (H) 08/26/2022 01:39 PM   HGBA1C 5.9 (A) 04/27/2021 11:00 AM   HGBA1C 6.3 10/15/2020 11:51 AM   GFR 35.11 (L) 04/21/2022 09:56 AM   GFR 59.42 (L) 10/15/2019 10:58 AM    Last diabetic Eye exam: No results found for: "HMDIABEYEEXA"  Last diabetic Foot exam: No results found for: "HMDIABFOOTEX"   Lab Results  Component Value Date   CHOL 129 01/07/2022   HDL 37 (L) 01/07/2022   LDLCALC 64 01/07/2022   TRIG 165 (H) 01/07/2022   CHOLHDL 3.5 01/07/2022       Latest Ref Rng & Units 01/07/2022   10:07 AM 07/15/2020  4:48 PM 10/15/2019   10:58 AM  Hepatic Function  Total Protein 6.0 - 8.5 g/dL 6.2  6.7  6.3   Albumin 3.7 - 4.7 g/dL 4.6  4.7  4.3   AST 0 - 40 IU/L _0 ALT 0 - 44 IU/L _1 Alk Phosphatase 44 - 121 IU/L 92  85  69   Total Bilirubin 0.0 - 1.2 mg/dL 0.3  0.5  0.6   Bilirubin, Direct 0.00 - 0.40 mg/dL 0.12   0.1      Lab Results  Component Value Date/Time   TSH 1.03 03/03/2022 03:51 PM   TSH 1.89 10/15/2020 11:51 AM   FREET4 0.91 03/03/2022 03:51 PM   FREET4 1.02 10/15/2020 11:51 AM       Latest Ref Rng & Units 08/20/2022   10:42 AM 03/27/2021   12:14 PM 02/12/2021    9:02 AM  CBC  WBC 4.0 - 10.5 K/uL 8.5  10.7  7.9   Hemoglobin 13.0 - 17.0 g/dL 14.6  16.1  15.6   Hematocrit 39.0 - 52.0 % 47.1  48.1  46.5   Platelets 150 - 400 K/uL 272  225  209     No results found for: "VD25OH"  Clinical ASCVD: Yes  The ASCVD Risk score (Arnett DK, et al., 2019) failed to calculate for the following reasons:   The valid total cholesterol range is 130 to 320 mg/dL       04/21/2022   10:01 AM 03/03/2022    3:09 PM 01/25/2022    2:05 PM  Depression screen PHQ 2/9  Decreased Interest 0 0 0  Down, Depressed, Hopeless 0 0 0  PHQ - 2 Score 0 0 0  Altered sleeping 0 0 0  Tired, decreased energy 0 0 1  Change in appetite 0 0 0  Feeling bad or failure about yourself  0 0 0  Trouble concentrating 0 0 0  Moving slowly or fidgety/restless 0 0 0  Suicidal thoughts 0 0 0  PHQ-9 Score 0 0 1  Difficult doing work/chores Not difficult at all Not difficult at all Not difficult at all      Social History   Tobacco Use  Smoking Status Never  Smokeless Tobacco Never   BP Readings from Last 3 Encounters:  09/01/22 (!) 98/58  08/27/22 (!) 91/50  08/20/22 117/73   Pulse Readings from Last 3 Encounters:  09/01/22 70  08/27/22 77  08/20/22 63   Wt Readings from Last 3 Encounters:  09/01/22 148 lb 11.2 oz (67.4 kg)  08/26/22 148 lb (67.1 kg)  08/20/22 148 lb (67.1 kg)   BMI Readings from Last 3 Encounters:  09/01/22 21.96 kg/m  08/26/22 21.86 kg/m  08/20/22 21.86 kg/m    Assessment/Interventions: Review of patient past medical history, allergies, medications, health status, including review of consultants reports, laboratory and other test data, was performed as part of comprehensive evaluation  and provision of chronic care management services.   SDOH:  (Social Determinants of Health) assessments and interventions performed: Yes  SDOH Interventions    Flowsheet Row Chronic Care Management from 09/13/2022 in [place] at [place] from 11/13/2021 in [place] at [place] Management from 09/11/2020 in [place] at [place] from 05/26/2020 in [place] at [place] Interventions -- Intervention Not Indicated -- Intervention Not Indicated  Housing Interventions -- Intervention  Not Indicated -- Intervention Not Indicated  Transportation Interventions -- Intervention Not Indicated Intervention Not Indicated Intervention Not Indicated  Depression Interventions/Treatment  -- -- -- MOL0-7 Score <4 Follow-up Not Indicated  Financial Strain Interventions Other (Comment)  [working on PAP for eye drops] Intervention Not Indicated Other (Comment)  [working on patient assistance for Entresto] Intervention Not Indicated  Physical Activity Interventions -- Intervention Not Indicated -- Other (Comments)  [Patient states that he cleans his home and does and yard]  Stress Interventions -- -- -- Intervention Not Indicated  Social Connections Interventions -- Intervention Not Indicated -- Intervention Not Indicated      SDOH Screenings   Food Insecurity: No Food Insecurity (08/26/2022)  Housing: Low Risk  (08/26/2022)  Transportation Needs: No Transportation Needs (08/26/2022)  Utilities: Not At Risk (08/26/2022)  Alcohol Screen: Low Risk  (11/13/2021)  Depression (PHQ2-9): Low Risk  (04/21/2022)  Financial Resource Strain: Medium Risk (09/13/2022)  Physical Activity: Sufficiently Active (11/13/2021)  Social Connections: Socially Integrated (11/13/2021)  Stress: No Stress Concern Present (11/13/2021)  Tobacco Use: Low Risk  (09/01/2022)    [place]  No Known  Allergies  Medications Reviewed Today     Reviewed by Isaac Bliss, Rayford Halsted, MD (Physician) on 09/01/22 at 1354  Med List Status: <None>   Medication Order Taking? Sig Documenting Provider Last Dose Status Informant  carvedilol (COREG) 25 MG tablet [id number] Yes TAKE 1 TABLET BY MOUTH TWICE DAILY WITH A MEAL . Taylor REFILLS [name], [name], MD Taking Active   cefdinir (OMNICEF) 300 MG capsule [id number] Yes Take 1 capsule (300 mg total) by mouth 2 (two) times daily. Raynelle Bring, MD Taking Active   dorzolamide-timolol (COSOPT) 22.3-6.8 MG/ML ophthalmic solution [id number] Yes Place 1 drop into both eyes 2 (two) times daily. [provider] Taking Active Self  empagliflozin (JARDIANCE) 10 MG TABS tablet [id number] Yes Take 1 tablet (10 mg total) by mouth daily before breakfast. Laurey Morale, MD Taking Active   furosemide (LASIX) 40 MG tablet [id number] Yes Take 1 tablet (40 mg total) by mouth daily. [name], [name], MD Taking Active Self  levothyroxine (SYNTHROID) 75 MCG tablet [id number] Yes TAKE 1 TABLET BY MOUTH ONCE DAILY 6 IN THE MORNING . APPOINTMENT REQUIRED FOR FUTURE REFILLS Laurey Morale, MD Taking Active Self  magic mouthwash (nystatin, lidocaine, diphenhydrAMINE, alum & mag hydroxide) suspension [id number] Yes Swish and swallow 5 mLs 3 (three) times daily. Isaac Bliss, Rayford Halsted, MD  Active   Melatonin 10 MG TABS [id number] Yes Take 20 mg by mouth at bedtime. [provider] Taking Active Self    Discontinued 09/01/22 1316 (Completed Course)   potassium chloride (KLOR-CON) 10 MEQ tablet [id number] Yes Take 1 tablet (10 mEq total) by mouth daily. Croitoru, Mihai, MD Taking Active Self  ROCKLATAN 0.02-0.005 % SOLN [id number] Yes Place 1 drop into both eyes at bedtime. [provider] Taking Active Self  rosuvastatin (CRESTOR) 5 MG tablet [id number] Yes Take 1 tablet (5 mg total) by mouth every other day.  Patient taking differently:  Take 2.5 mg by mouth daily.   Laurey Morale, MD Taking Active Self  sacubitril-valsartan [facility]) 49-51 MG [id number] Yes Take 1 tablet by mouth 2 (two) times daily. [name], [name], MD Taking Active Self  sodium fluoride (FLUORISHIELD) 1.1 % GEL dental gel [id number] Yes PLACE 1 APPLICATION ONTO THE TEETH AT BEDTIME Eppie Gibson, MD Taking Active Self  Patient Active Problem List   Diagnosis Date Noted   Bladder outlet obstruction 08/26/2022   Left arm swelling 04/21/2022   Chronic left-sided low back pain with left-sided sciatica 09/14/2021   ICD (implantable cardioverter-defibrillator) in place 07/16/2021   Constipation 04/08/2021   COVID-19 virus infection 03/16/2021   Elevated PSA 02/28/2020   Hypothyroidism 12/13/2019   Nonischemic cardiomyopathy ([place]) 09/28/2019   Personal history of malignant neoplasm of tongue 12/15/2015   Insomnia 06/11/2015   Skin infection at gastrostomy tube site ([place]) 06/09/2015   Depression, acute 03/24/2015   Palpitations 01/24/2015   History of skin cancer 12/03/2014   Cancer of base of tongue ([place]) 12/02/2014   Acute on chronic systolic CHF (congestive heart failure) ([place]) 07/12/2013   MUSCLE STRAIN, ABDOMINAL WALL 11/28/2008   ARTHRITIS 10/22/2008   BURSITIS, LEFT SHOULDER 07/24/2008   Internal hemorrhoids 12/08/2007   EXTERNAL HEMORRHOIDS 12/08/2007   DIVERTICULOSIS, COLON 12/08/2007   RECTAL BLEEDING 12/08/2007   History of cardiovascular disorder 12/08/2007   Congestive dilated cardiomyopathy ([place]) 06/13/2007   HX, PERSONAL, ARTHRITIS 06/13/2007   Hypercholesterolemia 05/15/2007   Essential hypertension 05/15/2007    Immunization History  Administered Date(s) Administered   Fluad Quad(high Dose 65+) 10/15/2019, 10/15/2020, 09/14/2021   Influenza Split 08/11/2011, 08/31/2012   Influenza Whole 08/18/2007, 07/11/2008, 08/12/2009, 06/25/2010   Influenza, High Dose Seasonal PF 07/31/2013, 07/01/2016, 07/18/2017, 08/09/2018    Influenza,inj,Quad PF,6+ Mos 07/31/2014, 06/13/2015   Janssen (J&J) SARS-COV-2 Vaccination 02/09/2020   PNEUMOCOCCAL CONJUGATE-20 06/01/2022   Pneumococcal Conjugate-13 09/23/2016   Pneumococcal Polysaccharide-23 06/19/2009   Td 06/27/2008   Zoster Recombinat (Shingrix) 02/21/2018, 07/02/2022   Zoster, Live 07/11/2008   Patient reports he was started on Rocklatan for his glaucoma and it is a branded medication which will put him in the donut hole a lot earlier in the year. He knows it's helping but really can't afford medications in the donut hole. Discussed PAP for Vyzulta as an alternative although not quite the same. Patient will ask his eye doctor.  Patient reports his BP is lower in the mornings and it's usually in the low 100s/60-70s. He doesn't feel different when its on the lower side. He feels somewhat dizzy/lightheaded when he moves quickly and once every 4-5 months.  Patient is doing a lot better since TURP procedure and he reports this procedure was "life changing".   Patient reports when he was first on the rosuvastatin and now he has built up a tolerance to it. He is doing 1/2 tablet daily and has been doing this for a couple of weeks. He just called into the pharmacy and likes to get a 90 days supply, which is what it was written for.   Conditions to be addressed/monitored:  Hypertension, Hyperlipidemia, Heart Failure, Hypothyroidism, Osteoarthritis and Glaucoma, Insomnia  Conditions addressed this visit: Hypertension, heart failure, hyperlipidemia  Care Plan : CCM Pharmacy Care Plan  Updates made by Viona Gilmore, [place] since 09/13/2022 12:00 AM     Problem: Problem: Hypertension, Hyperlipidemia, Heart Failure, Hypothyroidism, Osteoarthritis and Glaucoma, Insomnia      Long-Range Goal: Patient-Specific Goal   Start Date: 01/28/2021  Expected End Date: 01/28/2022  Recent Progress: On track  Priority: High  Note:   Current Barriers:  Unable to independently afford  treatment regimen Unable to independently monitor therapeutic efficacy  Pharmacist Clinical Goal(s):  Patient will achieve adherence to monitoring guidelines and medication adherence to achieve therapeutic efficacy maintain control of blood pressure as evidenced by home blood pressure readings  through  collaboration with PharmD and provider.   Interventions: 1:1 collaboration with Laurey Morale, MD regarding development and update of comprehensive plan of care as evidenced by provider attestation and co-signature Inter-disciplinary care team collaboration (see longitudinal plan of care) Comprehensive medication review performed; medication list updated in electronic medical record  Hypertension (BP goal <130/80) -Controlled -Current treatment: Carvedilol 30m, 1 tablet twice daily  - Appropriate, Effective, Safe, Accessible Entresto 49/51 mg, 1 tablet twice daily - Appropriate, Effective, Safe, Accessible Spironolactone 25 mg 1 tablet daily - Appropriate, Effective, Safe, Accessible -Medications previously tried: ramipril (low BP)    -Current home readings: 110-120/60-70s, 109/72 today (2-3 times a week) -Current dietary habits: did not discuss -Current exercise habits: patient plays golf and remains active throughout the day -Denies hypotensive/hypertensive symptoms -Educated on Exercise goal of 150 minutes per week; Importance of home blood pressure monitoring; Proper BP monitoring technique; Symptoms of hypotension and importance of maintaining adequate hydration; -Counseled to monitor BP at home weekly, document, and provide log at future appointments -Counseled on diet and exercise extensively Recommended to continue current medication  Hyperlipidemia: (LDL goal < 70) -Controlled -Current treatment: Rosuvastatin 5 mg 1/2 tablet daily Appropriate, Query effective, Safe, Accessible -Medications previously tried: simvastatin (unknown), atorvastatin (myalgias) -Current dietary  patterns: Patient notes avoids fried food and aim to eat baked fish. Avoids junk food. -Current exercise habits: active throughout the day -Educated on Cholesterol goals;  Benefits of statin for ASCVD risk reduction; Importance of limiting foods high in cholesterol; Exercise goal of 150 minutes per week; -Counseled on diet and exercise extensively Recommended to continue current medication Recommended repeat lipid panel.  CAD (Goal: prevent heart attacks and strokes) -Controlled -Current treatment  Rosuvastatin 5 mg 1/2 tablet daily - Appropriate, Effective, Safe, Accessible Aspirin 819m 1 tablet daily - Appropriate, Effective, Safe, Accessible -Medications previously tried: none  -Recommended to continue current medication  Pre-diabetes (A1c goal <6.5%) -Controlled -Current medications: Jardiance 10 mg 1 tablet daily - Appropriate, Effective, Safe, Accessible -Medications previously tried: none  -Current home glucose readings fasting glucose: does not check post prandial glucose: does not check -Denies hypoglycemic/hyperglycemic symptoms -Current meal patterns:  breakfast: did not discuss lunch: did not discuss  dinner: did not discuss snacks: did not discuss drinks: did not discuss -Current exercise: active throughout the day -Educated on A1c and blood sugar goals; Exercise goal of 150 minutes per week; Carbohydrate counting and/or plate method -Counseled to check feet daily and get yearly eye exams -Counseled on diet and exercise extensively Patient reported he ate "a lot of stuff he shouldn't" before this was checked and has improve since then  Heart Failure (Goal: manage symptoms and prevent exacerbations) -Controlled -Last ejection fraction: 20-25% (Date: 07/2020) -HF type: Systolic -NYHA Class: II (slight limitation of activity) -AHA HF Stage: C (Heart disease and symptoms present) -Current treatment: Carvedilol 2585m1 tablet twice daily - Appropriate,  Effective, Safe, Accessible  Entresto 49/51 mg, 1 tablet twice daily - Appropriate, Effective, Safe, Accessible Spironolactone 25 mg 1 tablet daily - Appropriate, Effective, Safe, Accessible Jardiance 10 mg 1 tablet daily - Appropriate, Effective, Safe, Accessible Furosemide 40 mg 1 tablet daily - Appropriate, Effective, Safe, Accessible -Medications previously tried: ramipril (low BP) -Current home BP/HR readings: lower normal range -Current dietary habits: limiting salt intake -Current exercise habits: stays active -Educated on Benefits of medications for managing symptoms and prolonging life Importance of weighing daily; if you gain more than 3 pounds in one day or 5 pounds in one week,  call cardiologist -Counseled on diet and exercise extensively Recommended to continue current medication Assessed patient finances. Provided 2 weeks worth of Jardiance samples to hold him over until the end of the year.  Hypothyroidism (Goal: TSH 0.35-4.5) -Controlled -Current treatment  levothyroxine 66mg, 1 tablet once daily - Appropriate, Effective, Safe, Accessible -Medications previously tried: none  -Recommended to continue current medication  Insomnia (Goal: improve quality and quantity of sleep) -Controlled -Current treatment  melatonin 142m 1 tablet at night - Appropriate, Effective, Safe, Accessible -Medications previously tried: none  -Recommended to continue current medication  BPH (Goal: minimize symptoms) -Controlled -Current treatment  None -Medications previously tried: tamsulosin (no longer needed with TURP procedure) -Recommended to continue current medication  Glaucoma (Goal: lower intraocular pressure) -Controlled -Current treatment  Brimodine use as directed - Appropriate, Effective, Safe, Accessible Rocklatan 1 drop at bedtime - Appropriate, Effective, Safe, Accessible -Medications previously tried: latanoprost (ineffective) -Recommended to continue current  medication  Health Maintenance -Vaccine gaps: tetanus, COVID booster, influenza -Current therapy:  Vitamin B complex daily -Educated on Cost vs benefit of each product must be carefully weighed by individual consumer -Patient is satisfied with current therapy and denies issues -Recommended to continue current medication  Patient Goals/Self-Care Activities Patient will:  - take medications as prescribed check blood pressure weekly, document, and provide at future appointments weigh daily, and contact provider if weight gain of > 3 lbs in one day or > 5 lbs in one week  Follow Up Plan: The care management team will reach out to the patient again over the next 7 days.         Medication Assistance:  EnDelene Lollbtained through [employer]ication assistance program.  Enrollment ends 10/10/22  Compliance/Adherence/Medication fill history: Care Gaps: Influenza, tetanus, COVID booster, Hep C screening BP - 98/58 (09/01/22)  Prevnar20 -    Star-Rating Drugs: Rosuvastatin 5 mg - Last filled 05/30/22 90 DS at [facility] Verified Jardiance 10 mg - [id number] [place] [id number] [place] [id number]: [phone]: [phone]one: [phone]: [phone] Uses pill box? Yes Pt endorses 100% compliance  We discussed: Current pharmacy is preferred with insurance plan and patient is satisfied with pharmacy services Patient decided to: Continue current medication management strategy  Care Plan and Follow Up Patient Decision:  Patient agrees to Care Plan and Follow-up.  Plan: The care management team will reach out to the patient again over the next 7 days.  MaJeni SallesPharmD BCUhs Wilson Memorial Hospitallinical  Pharmacist [place]

## 2022-09-13 NOTE — Telephone Encounter (Signed)
Patient called me to see if there was anything that could be done about his Entresto. He is running out before the end of the week and gets it from patient assistance. He called them and they said he needs a refill to be faxed in for them to refill it. He does not think they will be able to get it to him before he runs out and inquired about samples. Informed him our office does not have them but cardiology might. Routing to cardiology for refill and sample request.

## 2022-09-13 NOTE — Patient Instructions (Signed)
Hi [name],  It was great to catch up again! I'll let you know what cardiology says about the [facility].  Please reach out to me if you have any questions or need anything!  Best, Maddie  [name], PharmD, [place] [facility] [place]   Visit Information   Goals Addressed   None    Patient Care Plan: CCM Pharmacy Care Plan     Problem Identified: Problem: Hypertension, Hyperlipidemia, Heart Failure, Hypothyroidism, Osteoarthritis and Glaucoma, Insomnia      Long-Range Goal: Patient-Specific Goal   Start Date: 01/28/2021  Expected End Date: 01/28/2022  Recent Progress: On track  Priority: High  Note:   Current Barriers:  Unable to independently afford treatment regimen Unable to independently monitor therapeutic efficacy  Pharmacist Clinical Goal(s):  Patient will achieve adherence to monitoring guidelines and medication adherence to achieve therapeutic efficacy maintain control of blood pressure as evidenced by home blood pressure readings  through collaboration with PharmD and provider.   Interventions: 1:1 collaboration with Laurey Morale, MD regarding development and update of comprehensive plan of care as evidenced by provider attestation and co-signature Inter-disciplinary care team collaboration (see longitudinal plan of care) Comprehensive medication review performed; medication list updated in electronic medical record  Hypertension (BP goal <130/80) -Controlled -Current treatment: Carvedilol '25mg'$ , 1 tablet twice daily  - Appropriate, Effective, Safe, Accessible Entresto 49/51 mg, 1 tablet twice daily - Appropriate, Effective, Safe, Accessible Spironolactone 25 mg 1 tablet daily - Appropriate, Effective, Safe, Accessible -Medications previously tried: ramipril (low BP)    -Current home readings: 110-120/60-70s, 109/72 today (2-3 times a week) -Current dietary habits: did not discuss -Current exercise habits:  patient plays golf and remains active throughout the day -Denies hypotensive/hypertensive symptoms -Educated on Exercise goal of 150 minutes per week; Importance of home blood pressure monitoring; Proper BP monitoring technique; Symptoms of hypotension and importance of maintaining adequate hydration; -Counseled to monitor BP at home weekly, document, and provide log at future appointments -Counseled on diet and exercise extensively Recommended to continue current medication  Hyperlipidemia: (LDL goal < 70) -Controlled -Current treatment: Rosuvastatin 5 mg 1/2 tablet daily Appropriate, Query effective, Safe, Accessible -Medications previously tried: simvastatin (unknown), atorvastatin (myalgias) -Current dietary patterns: Patient notes avoids fried food and aim to eat baked fish. Avoids junk food. -Current exercise habits: active throughout the day -Educated on Cholesterol goals;  Benefits of statin for ASCVD risk reduction; Importance of limiting foods high in cholesterol; Exercise goal of 150 minutes per week; -Counseled on diet and exercise extensively Recommended to continue current medication Recommended repeat lipid panel.  CAD (Goal: prevent heart attacks and strokes) -Controlled -Current treatment  Rosuvastatin 5 mg 1/2 tablet daily - Appropriate, Effective, Safe, Accessible Aspirin '81mg'$ , 1 tablet daily - Appropriate, Effective, Safe, Accessible -Medications previously tried: none  -Recommended to continue current medication  Pre-diabetes (A1c goal <6.5%) -Controlled -Current medications: Jardiance 10 mg 1 tablet daily - Appropriate, Effective, Safe, Accessible -Medications previously tried: none  -Current home glucose readings fasting glucose: does not check post prandial glucose: does not check -Denies hypoglycemic/hyperglycemic symptoms -Current meal patterns:  breakfast: did not discuss lunch: did not discuss  dinner: did not discuss snacks: did not  discuss drinks: did not discuss -Current exercise: active throughout the day -Educated on A1c and blood sugar goals; Exercise goal of 150 minutes per week; Carbohydrate counting and/or plate method -Counseled to check feet daily and get yearly eye exams -Counseled on diet and exercise extensively Patient reported he  ate "a lot of stuff he shouldn't" before this was checked and has improve since then  Heart Failure (Goal: manage symptoms and prevent exacerbations) -Controlled -Last ejection fraction: 20-25% (Date: 07/2020) -HF type: Systolic -NYHA Class: II (slight limitation of activity) -AHA HF Stage: C (Heart disease and symptoms present) -Current treatment: Carvedilol '25mg'$ , 1 tablet twice daily - Appropriate, Effective, Safe, Accessible  Entresto 49/51 mg, 1 tablet twice daily - Appropriate, Effective, Safe, Accessible Spironolactone 25 mg 1 tablet daily - Appropriate, Effective, Safe, Accessible Jardiance 10 mg 1 tablet daily - Appropriate, Effective, Safe, Accessible Furosemide 40 mg 1 tablet daily - Appropriate, Effective, Safe, Accessible -Medications previously tried: ramipril (low BP) -Current home BP/HR readings: lower normal range -Current dietary habits: limiting salt intake -Current exercise habits: stays active -Educated on Benefits of medications for managing symptoms and prolonging life Importance of weighing daily; if you gain more than 3 pounds in one day or 5 pounds in one week, call cardiologist -Counseled on diet and exercise extensively Recommended to continue current medication Assessed patient finances. Provided 2 weeks worth of Jardiance samples to hold him over until the end of the year.  Hypothyroidism (Goal: TSH 0.35-4.5) -Controlled -Current treatment  levothyroxine 68mg, 1 tablet once daily - Appropriate, Effective, Safe, Accessible -Medications previously tried: none  -Recommended to continue current medication  Insomnia (Goal: improve quality  and quantity of sleep) -Controlled -Current treatment  melatonin '10mg'$ , 1 tablet at night - Appropriate, Effective, Safe, Accessible -Medications previously tried: none  -Recommended to continue current medication  BPH (Goal: minimize symptoms) -Controlled -Current treatment  None -Medications previously tried: tamsulosin (no longer needed with TURP procedure) -Recommended to continue current medication  Glaucoma (Goal: lower intraocular pressure) -Controlled -Current treatment  Brimodine use as directed - Appropriate, Effective, Safe, Accessible Rocklatan 1 drop at bedtime - Appropriate, Effective, Safe, Accessible -Medications previously tried: latanoprost (ineffective) -Recommended to continue current medication  Health Maintenance -Vaccine gaps: tetanus, COVID booster, influenza -Current therapy:  Vitamin B complex daily -Educated on Cost vs benefit of each product must be carefully weighed by individual consumer -Patient is satisfied with current therapy and denies issues -Recommended to continue current medication  Patient Goals/Self-Care Activities Patient will:  - take medications as prescribed check blood pressure weekly, document, and provide at future appointments weigh daily, and contact provider if weight gain of > 3 lbs in one day or > 5 lbs in one week  Follow Up Plan: The care management team will reach out to the patient again over the next 7 days.         Patient verbalizes understanding of instructions and care plan provided today and agrees to view in [facility] My[facility] status and patient understanding of how to access instructions and care plan via MyChart confirmed with patient.    The pharmacy team will reach out to the patient again over the next 2 days.   MViona Gilmore [facility]

## 2022-09-14 ENCOUNTER — Telehealth: Payer: Self-pay

## 2022-09-14 DIAGNOSIS — R351 Nocturia: Secondary | ICD-10-CM | POA: Diagnosis not present

## 2022-09-14 DIAGNOSIS — R8271 Bacteriuria: Secondary | ICD-10-CM | POA: Diagnosis not present

## 2022-09-14 DIAGNOSIS — N401 Enlarged prostate with lower urinary tract symptoms: Secondary | ICD-10-CM | POA: Diagnosis not present

## 2022-09-14 NOTE — Progress Notes (Signed)
Patient assistance application for [employer] pre filled to be mailed to patient for completion     [place] Pharmacist Assistant [phone]

## 2022-09-14 NOTE — Telephone Encounter (Signed)
Spoke to patient Entresto 49/51 mg samples left at [employer] office front desk.

## 2022-09-15 ENCOUNTER — Other Ambulatory Visit: Payer: Self-pay

## 2022-09-15 MED ORDER — ENTRESTO 49-51 MG PO TABS: 1.0000 | ORAL_TABLET | Freq: Two times a day (BID) | ORAL | 3 refills | Status: DC

## 2022-09-16 ENCOUNTER — Telehealth: Payer: Self-pay | Admitting: Cardiology

## 2022-09-16 NOTE — Telephone Encounter (Signed)
Patient stated he spoke with [employer] regarding Entresto. He should be getting shipment soon.

## 2022-09-16 NOTE — Telephone Encounter (Signed)
Spoke to patient he stated he spoke to [employer] no [employer] or a prescription is needed,all taken care of.

## 2022-09-16 NOTE — Telephone Encounter (Signed)
Patient called to let RN Malachy Mood know that he talked with the company he gets his medication set up with and no further forms or prescriptions need to be sent.  Patient stated it has all been handled now.

## 2022-10-01 ENCOUNTER — Ambulatory Visit (INDEPENDENT_AMBULATORY_CARE_PROVIDER_SITE_OTHER): Payer: PPO

## 2022-10-01 DIAGNOSIS — I428 Other cardiomyopathies: Secondary | ICD-10-CM

## 2022-10-01 LAB — CUP PACEART REMOTE DEVICE CHECK
Battery Remaining Longevity: 108 mo
Battery Remaining Percentage: 85 %
Battery Voltage: 3.01 V
Brady Statistic RV Percent Paced: 1 %
Date Time Interrogation Session: 20231222010035
HighPow Impedance: 65 Ohm
Implantable Lead Connection Status: 753985
Implantable Lead Implant Date: 20220623
Implantable Lead Location: 753860
Implantable Lead Model: 7122
Implantable Pulse Generator Implant Date: 20220623
Lead Channel Impedance Value: 360 Ohm
Lead Channel Pacing Threshold Amplitude: 0.75 V
Lead Channel Pacing Threshold Pulse Width: 0.5 ms
Lead Channel Sensing Intrinsic Amplitude: 11.8 mV
Lead Channel Setting Pacing Amplitude: 2 V
Lead Channel Setting Pacing Pulse Width: 0.5 ms
Lead Channel Setting Sensing Sensitivity: 0.5 mV
Pulse Gen Serial Number: 810027079
Zone Setting Status: 755011

## 2022-10-10 DIAGNOSIS — I5022 Chronic systolic (congestive) heart failure: Secondary | ICD-10-CM

## 2022-10-10 DIAGNOSIS — I10 Essential (primary) hypertension: Secondary | ICD-10-CM

## 2022-10-12 DIAGNOSIS — N1832 Chronic kidney disease, stage 3b: Secondary | ICD-10-CM | POA: Diagnosis not present

## 2022-10-18 DIAGNOSIS — R351 Nocturia: Secondary | ICD-10-CM | POA: Diagnosis not present

## 2022-10-18 DIAGNOSIS — I5042 Chronic combined systolic (congestive) and diastolic (congestive) heart failure: Secondary | ICD-10-CM | POA: Diagnosis not present

## 2022-10-18 DIAGNOSIS — N1330 Unspecified hydronephrosis: Secondary | ICD-10-CM | POA: Diagnosis not present

## 2022-10-18 DIAGNOSIS — N401 Enlarged prostate with lower urinary tract symptoms: Secondary | ICD-10-CM | POA: Diagnosis not present

## 2022-10-18 DIAGNOSIS — N1832 Chronic kidney disease, stage 3b: Secondary | ICD-10-CM | POA: Diagnosis not present

## 2022-10-18 DIAGNOSIS — I129 Hypertensive chronic kidney disease with stage 1 through stage 4 chronic kidney disease, or unspecified chronic kidney disease: Secondary | ICD-10-CM | POA: Diagnosis not present

## 2022-10-20 ENCOUNTER — Encounter: Payer: Self-pay | Admitting: Family Medicine

## 2022-10-20 ENCOUNTER — Telehealth: Payer: Self-pay | Admitting: *Deleted

## 2022-10-20 ENCOUNTER — Ambulatory Visit (INDEPENDENT_AMBULATORY_CARE_PROVIDER_SITE_OTHER): Payer: PPO

## 2022-10-20 ENCOUNTER — Encounter: Payer: Self-pay | Admitting: *Deleted

## 2022-10-20 ENCOUNTER — Ambulatory Visit (INDEPENDENT_AMBULATORY_CARE_PROVIDER_SITE_OTHER): Payer: PPO | Admitting: Family Medicine

## 2022-10-20 VITALS — BP 110/74 | HR 71 | Temp 97.7°F | Ht 69.0 in | Wt 154.6 lb

## 2022-10-20 DIAGNOSIS — Z0000 Encounter for general adult medical examination without abnormal findings: Secondary | ICD-10-CM

## 2022-10-20 DIAGNOSIS — M542 Cervicalgia: Secondary | ICD-10-CM | POA: Diagnosis not present

## 2022-10-20 DIAGNOSIS — G8929 Other chronic pain: Secondary | ICD-10-CM

## 2022-10-20 DIAGNOSIS — H401112 Primary open-angle glaucoma, right eye, moderate stage: Secondary | ICD-10-CM | POA: Diagnosis not present

## 2022-10-20 MED ORDER — EMPAGLIFLOZIN 10 MG PO TABS: 10.0000 mg | ORAL_TABLET | Freq: Every day | ORAL | 3 refills | Status: DC

## 2022-10-20 NOTE — Patient Outreach (Signed)
  Care Coordination   Initial Visit Note   10/20/2022 Name: ELBER GALYEAN MRN: [id number] DOB: 10-18-1940  PERETZ THIEME is a 82 y.o. year old male who sees Laurey Morale, MD for primary care. I spoke with  [name] by phone today.  What matters to the patients health and wellness today?  No needs    Goals Addressed             This Visit's Progress    COMPLETED: care coordination activity       Care Coordination Interventions: Reviewed medications with patient and discussed adherence with all medications with no needed refills Reviewed scheduled/upcoming provider appointments including sufficient transportation source Assessed social determinant of health barriers Educate on care management services related to nurse care manager, social workers and pharmacy . No needs presented today.         SDOH assessments and interventions completed:  Yes  SDOH Interventions Today    Flowsheet Row Most Recent Value  SDOH Interventions   Food Insecurity Interventions Intervention Not Indicated  Housing Interventions Intervention Not Indicated  Transportation Interventions Intervention Not Indicated  Utilities Interventions Intervention Not Indicated        Care Coordination Interventions:  Yes, provided   Follow up plan: No further intervention required.   Encounter Outcome:  Pt. Visit Completed   Raina Mina, RN Care Management Coordinator [place] [facility] [phone]

## 2022-10-20 NOTE — Progress Notes (Signed)
Subjective:    Patient ID: Nicholas Wilkerson, male    DOB: 1941-03-23, 82 y.o.   MRN: [id number]  HPI Here for a well exam. He is doing well in general. He feels good and has plenty of energy. He sees Dr. [name] regularly for the CHF. He sees Dr. Alinda Money for prostate exams and PSA tests. He sees Dr. Carolin Sicks regularly for CKD. His main complaint today is chronic neck pain. No hx of trauma. This started about 5 years ago. He has stiffness and pain in the neck, especially when turning his head side to side. No radiation to the arms.    Review of Systems  Constitutional: Negative.   HENT: Negative.    Eyes: Negative.   Respiratory: Negative.    Cardiovascular: Negative.   Gastrointestinal: Negative.   Genitourinary: Negative.   Musculoskeletal:  Positive for neck pain and neck stiffness.  Skin: Negative.   Neurological: Negative.   Psychiatric/Behavioral: Negative.         Objective:   Physical Exam Constitutional:      General: He is not in acute distress.    Appearance: Normal appearance. He is well-developed. He is not diaphoretic.  HENT:     Head: Normocephalic and atraumatic.     Right Ear: External ear normal.     Left Ear: External ear normal.     Nose: Nose normal.     Mouth/Throat:     Pharynx: No oropharyngeal exudate.  Eyes:     General: No scleral icterus.       Right eye: No discharge.        Left eye: No discharge.     Conjunctiva/sclera: Conjunctivae normal.     Pupils: Pupils are equal, round, and reactive to light.  Neck:     Thyroid: No thyromegaly.     Vascular: No JVD.     Trachea: No tracheal deviation.  Cardiovascular:     Rate and Rhythm: Normal rate and regular rhythm.     Heart sounds: Normal heart sounds. No murmur heard.    No friction rub. No gallop.  Pulmonary:     Effort: Pulmonary effort is normal. No respiratory distress.     Breath sounds: Normal breath sounds. No wheezing or rales.  Chest:     Chest wall: No tenderness.  Abdominal:      General: Bowel sounds are normal. There is no distension.     Palpations: Abdomen is soft. There is no mass.     Tenderness: There is no abdominal tenderness. There is no guarding or rebound.  Genitourinary:    Penis: No tenderness.   Musculoskeletal:        General: No swelling or tenderness.     Cervical back: Neck supple.     Comments: The ROM of his neck is full to extension and flexion, but rotation is limited by pain   Lymphadenopathy:     Cervical: No cervical adenopathy.  Skin:    General: Skin is warm and dry.     Coloration: Skin is not pale.     Findings: No erythema or rash.  Neurological:     Mental Status: He is alert and oriented to person, place, and time.     Cranial Nerves: No cranial nerve deficit.     Motor: No abnormal muscle tone.     Coordination: Coordination normal.     Deep Tendon Reflexes: Reflexes are normal and symmetric. Reflexes normal.  Psychiatric:  Behavior: Behavior normal.        Thought Content: Thought content normal.        Judgment: Judgment normal.           Assessment & Plan:  Well exam. We discussed diet and exercise. Get fasting labs. For the neck pain, we will get Xrays of the cervical spine today.  Alysia Penna, MD

## 2022-10-21 NOTE — Progress Notes (Signed)
Remote ICD transmission.   

## 2022-10-25 ENCOUNTER — Other Ambulatory Visit: Payer: PPO

## 2022-10-25 LAB — CBC WITH DIFFERENTIAL/PLATELET
Basophils Absolute: 0.1 10*3/uL (ref 0.0–0.1)
Basophils Relative: 1.4 % (ref 0.0–3.0)
Eosinophils Absolute: 0.1 10*3/uL (ref 0.0–0.7)
Eosinophils Relative: 1.8 % (ref 0.0–5.0)
HCT: 46.1 % (ref 39.0–52.0)
Hemoglobin: 15 g/dL (ref 13.0–17.0)
Lymphocytes Relative: 15.3 % (ref 12.0–46.0)
Lymphs Abs: 1 10*3/uL (ref 0.7–4.0)
MCHC: 32.6 g/dL (ref 30.0–36.0)
MCV: 89.5 fl (ref 78.0–100.0)
Monocytes Absolute: 0.6 10*3/uL (ref 0.1–1.0)
Monocytes Relative: 9.8 % (ref 3.0–12.0)
Neutro Abs: 4.8 10*3/uL (ref 1.4–7.7)
Neutrophils Relative %: 71.7 % (ref 43.0–77.0)
Platelets: 235 10*3/uL (ref 150.0–400.0)
RBC: 5.15 Mil/uL (ref 4.22–5.81)
RDW: 15.7 % — ABNORMAL HIGH (ref 11.5–15.5)
WBC: 6.6 10*3/uL (ref 4.0–10.5)

## 2022-10-25 LAB — BASIC METABOLIC PANEL
BUN: 34 mg/dL — ABNORMAL HIGH (ref 6–23)
CO2: 28 mEq/L (ref 19–32)
Calcium: 9.6 mg/dL (ref 8.4–10.5)
Chloride: 102 mEq/L (ref 96–112)
Creatinine, Ser: 1.79 mg/dL — ABNORMAL HIGH (ref 0.40–1.50)
GFR: 35.69 mL/min — ABNORMAL LOW (ref >60.00)
Glucose, Bld: 104 mg/dL — ABNORMAL HIGH (ref 70–99)
Potassium: 4.3 mEq/L (ref 3.5–5.1)
Sodium: 138 mEq/L (ref 135–145)

## 2022-10-25 LAB — HEPATIC FUNCTION PANEL
ALT: 13 U/L (ref 0–53)
AST: 18 U/L (ref 0–37)
Albumin: 4.3 g/dL (ref 3.5–5.2)
Alkaline Phosphatase: 73 U/L (ref 39–117)
Bilirubin, Direct: 0.1 mg/dL (ref 0.0–0.3)
Total Bilirubin: 0.6 mg/dL (ref 0.2–1.2)
Total Protein: 6.8 g/dL (ref 6.0–8.3)

## 2022-10-25 LAB — LIPID PANEL
Cholesterol: 154 mg/dL (ref 0–200)
HDL: 48.1 mg/dL (ref >39.00)
LDL Cholesterol: 91 mg/dL (ref 0–99)
NonHDL: 106.27
Total CHOL/HDL Ratio: 3
Triglycerides: 76 mg/dL (ref 0.0–149.0)
VLDL: 15.2 mg/dL (ref 0.0–40.0)

## 2022-10-25 LAB — TSH: TSH: 0.97 u[IU]/mL (ref 0.35–5.50)

## 2022-10-25 LAB — HEMOGLOBIN A1C: Hgb A1c MFr Bld: 6 % (ref 4.6–6.5)

## 2022-11-08 ENCOUNTER — Encounter: Payer: Self-pay | Admitting: Cardiology

## 2022-11-08 ENCOUNTER — Ambulatory Visit: Payer: PPO | Attending: Cardiology | Admitting: Cardiology

## 2022-11-08 VITALS — BP 126/72 | HR 69 | Ht 69.0 in | Wt 154.0 lb

## 2022-11-08 DIAGNOSIS — D6869 Other thrombophilia: Secondary | ICD-10-CM | POA: Diagnosis not present

## 2022-11-08 DIAGNOSIS — I493 Ventricular premature depolarization: Secondary | ICD-10-CM | POA: Diagnosis not present

## 2022-11-08 DIAGNOSIS — I5022 Chronic systolic (congestive) heart failure: Secondary | ICD-10-CM

## 2022-11-08 DIAGNOSIS — Z95810 Presence of automatic (implantable) cardiac defibrillator: Secondary | ICD-10-CM | POA: Diagnosis not present

## 2022-11-08 DIAGNOSIS — I4729 Other ventricular tachycardia: Secondary | ICD-10-CM

## 2022-11-08 MED ORDER — MEXILETINE HCL 250 MG PO CAPS: 250.0000 mg | ORAL_CAPSULE | Freq: Two times a day (BID) | ORAL | 3 refills | Status: DC

## 2022-11-08 NOTE — Progress Notes (Signed)
Electrophysiology Office Note   Date:  11/08/2022   ID:  Nicholas Wilkerson, DOB 1941-07-14, MRN [id number]  PCP:  Laurey Morale, MD  Cardiologist:  [name] Primary Electrophysiologist:  [name], MD    Chief Complaint: CHF   History of Present Illness: Nicholas Wilkerson is a 82 y.o. male who is being seen today for the evaluation of CHF at the request of Laurey Morale, MD. Presenting today for electrophysiology evaluation.  He has a history sniffer dilated cardiomyopathy, hypertension, hyperlipidemia, squamous cell carcinoma of the base of the tongue postchemotherapy and radiation.  November 2021 he developed palpitations.  Monitor showed bursts of SVT and nonsustained VT.  January 2022 he developed fatigue.  He was found have an ejection fraction 20 to 25%.  He is post [employer] ICD implanted 04/02/2021.  He was noted to have PVCs on subsequent EKG.  Cardiac monitor showed a burden of 18.4%.    Today, denies symptoms of palpitations, chest pain, shortness of breath, orthopnea, PND, lower extremity edema, claudication, dizziness, presyncope, syncope, bleeding, or neurologic sequela. The patient is tolerating medications without difficulties.  He states that the only thing that he notices is that he is more tired when he is active.  Aside from that he feels well and is without major complaint.    Past Medical History:  Diagnosis Date   ARTHRITIS 10/22/2008   BPH (benign prostatic hyperplasia)    CHF (congestive heart failure) ([place])    sees Dr. Peter [name]    Dilated cardiomyopathy [facility])    DIVERTICULOSIS, COLON 12/08/2007   Glaucoma    sees Dr. Heather [place]    H/O asbestos exposure    History of echocardiogram 05/22/2007   EF was 45-50% / Mild concentric LV hypertrophy with mild global hypokinesis and overall mild systolic dysfunction .  Mild AV sclerosis / Mild Mitral insufficiency / compared to prior study 04/24/02 -- LV function has improved further.     History of  kidney stones    Hypercholesterolemia    Hypertension    Insomnia 06/11/2015   Radiation 01/01/15-02/18/15   base of tongue and bilateral neck 70 Gy   Skin cancer    squamous cell, basal cell   Squamous cell carcinoma 11/20/2014   base of tongue primary   Throat cancer ([place]) 10/2014   had chemo   Thyroid disease    Past Surgical History:  Procedure Laterality Date   CARDIAC CATHETERIZATION  10/17/2001   EF estimated at 30% / moderate LV enlargement  / 1. Minimal nonobstructive atherosclerotic coronary artery disease / 2. Severe LV dysfunction with global hypokinesia consistent with dilated nonischemic cardiomyopath / 3. Moderate pulmonary hypertension   COLONOSCOPY  06/23/2017   per Dr. Carlean Purl, clear, no repeats needed    CYSTOSCOPY N/A 08/26/2022   Procedure: CYSTOSCOPY;  Surgeon: Raynelle Bring, MD;  Location: [facility];  Service: Urology;  Laterality: N/A;  90 MINUTES NEEDED FOR CASE  ANESTHESIA IS GENERAL OR SPINAL   ICD IMPLANT N/A 04/02/2021   Procedure: ICD IMPLANT;  Surgeon: [name], MD;  Location: [place] CV LAB;  Service: Cardiovascular;  Laterality: N/A;   LYMPH NODE BIOPSY     RIGHT/LEFT HEART CATH AND CORONARY ANGIOGRAPHY N/A 07/22/2020   Procedure: RIGHT/LEFT HEART CATH AND CORONARY ANGIOGRAPHY;  Surgeon: [name], [name], MD;  Location: [place] CV LAB;  Service: Cardiovascular;  Laterality: N/A;   TRANSURETHRAL RESECTION OF PROSTATE N/A 08/26/2022   Procedure: TRANSURETHRAL RESECTION OF  THE PROSTATE (TURP);  Surgeon: Raynelle Bring, MD;  Location: [facility];  Service: Urology;  Laterality: N/A;     Current Outpatient Medications  Medication Sig Dispense Refill   aspirin EC 81 MG tablet Take 81 mg by mouth daily. Swallow whole.     carvedilol (COREG) 25 MG tablet TAKE 1 TABLET BY MOUTH TWICE DAILY WITH A MEAL . APPOINTMENT REQUIRED FOR FUTURE REFILLS 180 tablet 0   dorzolamide-timolol (COSOPT) 22.3-6.8 MG/ML ophthalmic solution Place 1 drop into both  eyes 2 (two) times daily.     empagliflozin (JARDIANCE) 10 MG TABS tablet Take 1 tablet (10 mg total) by mouth daily before breakfast. 90 tablet 3   furosemide (LASIX) 40 MG tablet Take 1 tablet (40 mg total) by mouth daily. 90 tablet 3   levothyroxine (SYNTHROID) 75 MCG tablet TAKE 1 TABLET BY MOUTH ONCE DAILY 6 IN THE MORNING . APPOINTMENT REQUIRED FOR FUTURE REFILLS 90 tablet 3   Melatonin 10 MG TABS Take 20 mg by mouth at bedtime.     mexiletine (MEXITIL) 250 MG capsule Take 1 capsule (250 mg total) by mouth 2 (two) times daily. 60 capsule 3   potassium chloride (KLOR-CON) 10 MEQ tablet Take 1 tablet (10 mEq total) by mouth daily. 30 tablet 3   ROCKLATAN 0.02-0.005 % SOLN Place 1 drop into both eyes at bedtime.     rosuvastatin (CRESTOR) 5 MG tablet Take 1 tablet (5 mg total) by mouth every other day. (Patient taking differently: Take 2.5 mg by mouth daily.) 45 tablet 3   sacubitril-valsartan (ENTRESTO) 49-51 MG Take 1 tablet by mouth 2 (two) times daily. 180 tablet 3   sodium fluoride (FLUORISHIELD) 1.1 % GEL dental gel PLACE 1 APPLICATION ONTO THE TEETH AT BEDTIME 112 mL 11   No current facility-administered medications for this visit.    Allergies:   Patient has no known allergies.   Social History:  The patient  reports that he has never smoked. He has never used smokeless tobacco. He reports current alcohol use. He reports that he does not use drugs.   Family History:  The patient's family history includes Angina in his mother; Cancer in his father; Stroke in his father.   ROS:  Please see the history of present illness.   Otherwise, review of systems is positive for none.   All other systems are reviewed and negative.   PHYSICAL EXAM: VS:  BP 126/72   Pulse 69   Ht '5\' 9"'$  (1.753 m)   Wt 154 lb (69.9 kg)   SpO2 98%   BMI 22.74 kg/m  , BMI Body mass index is 22.74 kg/m. GEN: Well nourished, well developed, in no acute distress  HEENT: normal  Neck: no JVD, carotid bruits, or  masses Cardiac: RRR; no murmurs, rubs, or gallops,no edema  Respiratory:  clear to auscultation bilaterally, normal work of breathing GI: soft, nontender, nondistended, + BS MS: no deformity or atrophy  Skin: warm and dry, device site well healed Neuro:  Strength and sensation are intact Psych: euthymic mood, full affect  EKG:  EKG is ordered today. Personal review of the ekg ordered shows sinus rhythm, PVCs  Personal review of the device interrogation today. Results in [place]: 01/07/2022: Magnesium 2.7 06/23/2022: NT-Pro BNP 6,147 10/25/2022: ALT 13; BUN 34; Creatinine, Ser 1.79; Hemoglobin 15.0; Platelets 235.0; Potassium 4.3; Sodium 138; TSH 0.97    Lipid Panel     Component Value Date/Time   CHOL 154 10/25/2022 0932  CHOL 129 01/07/2022 1007   TRIG 76.0 10/25/2022 0932   TRIG 126 08/25/2006 0850   HDL 48.10 10/25/2022 0932   HDL 37 (L) 01/07/2022 1007   CHOLHDL 3 10/25/2022 0932   VLDL 15.2 10/25/2022 0932   LDLCALC 91 10/25/2022 0932   LDLCALC 64 01/07/2022 1007     Wt Readings from Last 3 Encounters:  11/08/22 154 lb (69.9 kg)  10/20/22 154 lb 9.6 oz (70.1 kg)  09/01/22 148 lb 11.2 oz (67.4 kg)      Other studies Reviewed: Additional studies/ records that were reviewed today include: TTE 01/05/21  Review of the above records today demonstrates:   1. Left ventricular ejection fraction, by estimation, is 20 to 25%. The  left ventricle has severely decreased function. The left ventricle  demonstrates global hypokinesis. The left ventricular internal cavity size  was mildly to moderately dilated. Left  ventricular diastolic parameters are consistent with Grade II diastolic  dysfunction (pseudonormalization). Elevated left atrial pressure.   2. Right ventricular systolic function is normal. The right ventricular  size is normal.   3. Left atrial size was mild to moderately dilated.   4. The mitral valve is normal in structure. Mild mitral valve   regurgitation.   5. The aortic valve is tricuspid. Aortic valve regurgitation is not  visualized. Mild aortic valve sclerosis is present, with no evidence of  aortic valve stenosis.   6. The inferior vena cava is normal in size with greater than 50%  respiratory variability, suggesting right atrial pressure of 3 mmHg.   RHC/LHC 07/22/20 Prox LAD to Mid LAD lesion is 25% stenosed. Prox Cx to Dist Cx lesion is 25% stenosed. Prox RCA to Mid RCA lesion is 15% stenosed. LV end diastolic pressure is moderately elevated. Hemodynamic findings consistent with moderate pulmonary hypertension.   ASSESSMENT AND PLAN:  1.  Chronic systolic heart failure: Due to dilated cardiomyopathy.  Currently on off medical therapy with carvedilol, Entresto, Aldactone, Jardiance.  Status post [place] ICD implanted 04/02/2021.  Device functioning appropriately.  No changes at this time.  2.  Hypertension: Currently well-controlled  3.  PVCs/nonsustained VT: 18 % PVC burden on cardiac monitor.  He is also having episodes of VT into the therapy zones of his defibrillator.  Because of this ectopy, Nicholas Wilkerson start mexiletine 250 mg twice daily.  If this gets too expensive, he may require amiodarone.   Current medicines are reviewed at length with the patient today.   The patient does not have concerns regarding his medicines.  The following changes were made today: Start mexiletine  Labs/ tests ordered today include:  Orders Placed This Encounter  Procedures   EKG 12-Lead      Disposition:   FU 3 months  Signed, Venisa Frampton Meredith Leeds, MD  11/08/2022 4:44 PM     [place] [address] [place] [address] [phone] (office) [phone] (fax)

## 2022-11-08 NOTE — Patient Instructions (Signed)
Medication Instructions:  Your physician has recommended you make the following change in your medication: START Mexiletine 250 mg twice daily  *If you need a refill on your cardiac medications before your next appointment, please call your pharmacy*   Lab Work: None ordered   Testing/Procedures: None ordered   Follow-Up: At [facility], you and your health needs are our priority.  As part of our continuing mission to provide you with exceptional heart care, we have created designated Provider Care Teams.  These Care Teams include your primary Cardiologist (physician) and Advanced Practice Providers (APPs -  Physician Assistants and Nurse Practitioners) who all work together to provide you with the care you need, when you need it.   Remote monitoring is used to monitor your Pacemaker or ICD from home. This monitoring reduces the number of office visits required to check your device to one time per year. It allows [place] to keep an eye on the functioning of your device to ensure it is working properly. You are scheduled for a device check from home on 12/31/2022. You may send your transmission at any time that day. If you have a wireless device, the transmission will be sent automatically. After your physician reviews your transmission, you will receive a postcard with your next transmission date.  Your next appointment:   3 month(s)  The format for your next appointment:   In Person  Provider:   You will see one of the following Advanced Practice Providers on your designated Care Team:   Tommye Standard, [place] Legrand Como "Jonni Sanger" Chalmers Cater, [place]     Thank you for choosing [facility]!!   Trinidad Curet, RN [phone]    Other Instructions  Mexiletine Capsules What is this medication? MEXILETINE (mex [place] e teen) treats a fast or irregular heartbeat (arrhythmia). It works by slowing down overactive electric signals in the heart, which stabilizes your heart rhythm. It belongs to a  group of medications called antiarrhythmics. This medicine may be used for other purposes; ask your health care provider or pharmacist if you have questions. COMMON BRAND NAME(S): Mexitil What should I tell my care team before I take this medication? They need to know if you have any of these conditions: Liver disease Other heart problems Previous heart attack An unusual or allergic reaction to mexiletine, other medications, foods, dyes, or preservatives Pregnant or trying to get pregnant Breast-feeding How should I use this medication? Take this medication by mouth with a glass of water. Follow the directions on the prescription label. It is recommended that you take this medication with food or an antacid. Take your doses at regular intervals. Do not take your medication more often than directed. Do not stop taking except on the advice of your care team. Talk to your care team about the use of this medication in children. Special care may be needed. Overdosage: If you think you have taken too much of this medicine contact a poison control center or emergency room at once. NOTE: This medicine is only for you. Do not share this medicine with others. What if I miss a dose? If you miss a dose, take it as soon as you can. If it is almost time for your next dose, take only that dose. Do not take double or extra doses. What may interact with this medication? Do not take this medication with any of the following: Dofetilide This medication may also interact with the following: Caffeine Cimetidine Medications for depression, anxiety, or psychotic disturbances Medications  to control heart rhythm Phenobarbital Phenytoin Rifampin Theophylline This list may not describe all possible interactions. Give your health care provider a list of all the medicines, herbs, non-prescription drugs, or dietary supplements you use. Also tell them if you smoke, drink alcohol, or use illegal drugs. Some items may  interact with your medicine. What should I watch for while using this medication? Your condition will be monitored closely when you first begin therapy. Often, this medication is first started in a hospital or other monitored health care setting. Once you are on maintenance therapy, visit your care team for regular checks on your progress. Because your condition and use of this medication carry some risk, it is a good idea to carry an identification card, necklace or bracelet with details of your condition, medications, and care team. You may get drowsy or dizzy. Do not drive, use machinery, or do anything that needs mental alertness until you know how this medication affects you. Do not stand or sit up quickly, especially if you are an older patient. This reduces the risk of dizzy or fainting spells. Alcohol can make you more dizzy, increase flushing and rapid heartbeats. Avoid alcoholic drinks. This medication may cause serious skin reactions. They can happen weeks to months after starting the medication. Contact your care team right away if you notice fevers or flu-like symptoms with a rash. The rash may be red or purple and then turn into blisters or peeling of the skin. Or, you might notice a red rash with swelling of the face, lips or lymph nodes in your neck or under your arms. What side effects may I notice from receiving this medication? Side effects that you should report to your care team as soon as possible: Allergic reactions--skin rash, itching, hives, swelling of the face, lips, tongue, or throat Heart rhythm changes--fast or irregular heartbeat, dizziness, feeling faint or lightheaded, chest pain, trouble breathing Infection--fever, chills, cough, or sore throat Liver injury--right upper belly pain, loss of appetite, nausea, light-colored stool, dark yellow or brown urine, yellowing skin or eyes, unusual weakness or fatigue Rash, fever, and swollen lymph nodes Seizures Unusual bruising  or bleeding Side effects that usually do not require medical attention (report to your care team if they continue or are bothersome): Anxiety, nervousness Blurry vision Dizziness Headache Heartburn Nausea Tremors or shaking Vomiting This list may not describe all possible side effects. Call your doctor for medical advice about side effects. You may report side effects to FDA at 1-800-FDA-1088. Where should I keep my medication? Keep out of reach of children. Store at room temperature between 15 and 30 degrees C (59 and 86 degrees F). Throw away any unused medication after the expiration date. NOTE: This sheet is a summary. It may not cover all possible information. If you have questions about this medicine, talk to your doctor, pharmacist, or health care provider.  2023 Elsevier/Gold Standard (2021-04-08 00:00:00)

## 2022-11-16 ENCOUNTER — Telehealth (INDEPENDENT_AMBULATORY_CARE_PROVIDER_SITE_OTHER): Payer: PPO | Admitting: Family Medicine

## 2022-11-16 VITALS — Wt 149.0 lb

## 2022-11-16 DIAGNOSIS — Z0000 Encounter for general adult medical examination without abnormal findings: Secondary | ICD-10-CM | POA: Diagnosis not present

## 2022-11-16 NOTE — Patient Instructions (Signed)
I really enjoyed getting to talk with you today! I am available on Tuesdays and Thursdays for virtual visits if you have any questions or concerns, or if I can be of any further assistance.   CHECKLIST FROM ANNUAL WELLNESS VISIT:  -Follow up (please call to schedule if not scheduled after visit):   -yearly for annual wellness visit with primary care office  Here is a list of your preventive care/health maintenance measures and the plan for each if any are due:  Health Maintenance  Topic Date Due   Hepatitis C Screening  Never done, can request with labs if you wish.   INFLUENZA VACCINE  Noted that you declined, let us know if any questions.   COVID-19 Vaccine (2 - Janssen risk series) Noted that you declined, let us know if any questions.   Medicare Annual Wellness (AWV)  11/17/2023   Pneumonia Vaccine 27+ Years old  Completed   Zoster Vaccines- Shingrix  Completed   HPV VACCINES  Aged Out   DTaP/Tdap/Td  Discontinued   COLONOSCOPY (Pts 45-77yr Insurance coverage will need to be confirmed)  Discontinued    -See a dentist at least yearly  -Get your eyes checked and then per your eye specialist's recommendations  -Other issues addressed today:   -I have included below further information regarding a healthy whole foods based diet, physical activity guidelines for adults, stress management and opportunities for social connections. I hope you find this information useful.     NUTRITION: -eat real food: lots of colorful vegetables (half the plate) and fruits -5-7 servings of vegetables and fruits per day (fresh or steamed is best), exp. 2 servings of vegetables with lunch and dinner and 2 servings of fruit per day. Berries and greens such  as kale and collards are great choices.  -consume on a regular basis: whole grains (make sure first ingredient on label contains the word "whole"), fresh fruits, fish, nuts, seeds, healthy oils (such as olive oil, avocado oil, grape seed oil) -may eat small amounts of dairy and lean meat on occasion, but avoid processed meats such as ham, bacon, lunch meat, etc. -drink water -try to avoid fast food and pre-packaged foods, processed meat -most experts advise limiting sodium to < '2300mg'$  per day, should limit further is any chronic conditions such as high blood pressure, heart disease, diabetes, etc. The American Heart Association advised that < '1500mg'$  is is ideal -try to avoid foods that contain any ingredients with names you do not recognize  -try to avoid sugar/sweets (except for the natural sugar that occurs in fresh fruit) -try to avoid sweet drinks -try to avoid white rice, white bread, pasta (unless whole grain), white or yellow potatoes  EXERCISE GUIDELINES FOR ADULTS: -if you wish to increase your physical activity, do so gradually and with the approval of your doctor -STOP and seek medical care immediately if you have any chest pain, chest discomfort or trouble breathing when starting or increasing exercise  -move and stretch your body, legs, feet and arms when sitting for long periods -Physical activity guidelines for optimal health in adults: -least 150 minutes per week of aerobic exercise (can talk, but not sing) once approved by your doctor, 20-30 minutes of sustained activity or two 10 minute episodes of sustained activity every day.  -resistance training at least 2 days per week if approved by your doctor -balance exercises 3+ days per week:   Stand somewhere where you have something sturdy to hold onto if you lose balance.  1) lift up on toes, start with 5x per day and work up to 20x   2) stand and lift on leg straight out to the side so that foot is a few inches of the floor,  start with 5x each side and work up to 20x each side   3) stand on one foot, start with 5 seconds each side and work up to 20 seconds on each side  If you need ideas or help with getting more active:  -Silver sneakers https://tools.silversneakers.com  -Walk with a Doc: http://stephens-thompson.biz/  -try to include resistance (weight lifting/strength building) and balance exercises twice per week: or the following link for ideas: ChessContest.fr  UpdateClothing.com.cy  STRESS MANAGEMENT: -can try meditating, or just sitting quietly with deep breathing while intentionally relaxing all parts of your body for 5 minutes daily -if you need further help with stress, anxiety or depression please follow up with your primary doctor or contact the wonderful folks at [place]: [place]: -options in [place] if you wish to engage in more social and exercise related activities:  -Silver sneakers https://tools.silversneakers.com  -Walk with a Doc: http://stephens-thompson.biz/  -Check out the [place] 50+ section on the [place] [employer] (hiking clubs, book clubs, cards and games, chess, exercise classes, aquatic classes and much more) - see the website for details: https://www.[place].gov/departments/parks-recreation/active-adults50  -YouTube has lots of exercise videos for different ages and abilities as well  -[place] (a variety of indoor and outdoor inperson activities for adults). [phone]) [phone]. [address]  -Virtual Online Classes (a variety of topics): see seniorplanet.org or call ([phone]  -consider volunteering at a school, hospice center, church, senior center or elsewhere

## 2022-11-16 NOTE — Progress Notes (Signed)
PATIENT CHECK-IN and HEALTH RISK ASSESSMENT QUESTIONNAIRE:  -completed by phone/video for upcoming Medicare Preventive Visit  Pre-Visit Check-in: 1)Vitals (height, wt, BP, etc) - record in vitals section for visit on day of visit 2)Review and Update Medications, Allergies PMH, Surgeries, Social history in Epic 3)Hospitalizations in the last year with date/reason? 1 night in Nov 2023 4)Review and Update Care Team (patient's specialists) in Epic 5) Complete PHQ9 in Epic  6) Complete Fall Screening in Epic 7)Review all Health Maintenance Due and order under PCP if not done.  Medicare Wellness Patient Questionnaire:  Answer theses question about your habits: Do you drink alcohol? No If yes, how many drinks do you have a day?0 Have you ever smoked?0 Quit date if applicable? N/A  How many packs a day do/did you smoke? 0 Do you use smokeless tobacco?0 Do you use an illicit drugs?No Do you exercises?  Yes, very active, very active with the grand, gets at least 150 minutes per week of exercise, kids, [employer] work Are you sexually active? No Number of partners?1 Grows a lot of vegetables in garden and eats local unprocessed meats Typical breakfast: Egg, Sausage, toast Typical lunch-Stew, left overs from dinner prior night, BBQ Typical dinner: Spaghetti, Pot Roast, Greens Typical snacks:Peanut Crackers, peanuts  Beverages: Water at least 60 oz daily  Answer theses question about you: Can you perform most household chores?Yes Do you find it hard to follow a conversation in a noisy room?No Do you often ask people to speak up or repeat themselves?No Do you feel that you have a problem with memory?-No, not really Do you balance your checkbook and or bank acounts?Yes Do you feel safe at home? Yes Last dentist visit? about 6 months Do you need assistance with any of the following: Please note if so No  Driving?  Feeding yourself?  Getting from bed to chair?  Getting to the toilet?  Bathing or  showering?  Dressing yourself?  Managing money?  Climbing a flight of stairs  Preparing meals?    Do you have Advanced Directives in place (Living Will, Healthcare Power or Attorney)? Yes   Last eye Exam and location?3 weeks, [facility]   Do you currently use prescribed or non-prescribed narcotic or opioid pain medications?No  Do you have a history or close family history of breast, ovarian, tubal or peritoneal cancer or a family member with BRCA (breast cancer susceptibility 1 and 2) gene mutations?  Nurse/Assistant Credentials/time stamp:   ----------------------------------------------------------------------------------------------------------------------------------------------------------------------------------------------------------------------    MEDICARE ANNUAL PREVENTIVE CARE VISIT WITH PROVIDER (Welcome to Medicare, initial annual wellness or annual wellness exam)  Virtual Visit via Video Note  I connected with Nicholas Wilkerson  on 11/16/22 by a video enabled telemedicine application and verified that I am speaking with the correct person using two identifiers.  Location patient: home Location provider:work or home office Persons participating in the virtual visit: patient, provider  Concerns and/or follow up today: Just saw PCP and cardiologist recently. Doing well per his report.    See HM section in Epic for other details of completed HM.    ROS: negative for report of fevers, unintentional weight loss, vision changes, vision loss, hearing loss or change, chest pain, sob, hemoptysis, melena, hematochezia, hematuria, genital discharge or lesions, falls, bleeding or bruising, loc, thoughts of suicide or self harm, memory loss  Patient-completed extensive health risk assessment - reviewed and discussed with the patient: See Health Risk Assessment completed with patient prior to the visit either above or in recent phone note.  This was reviewed in detailed with the patient  today and appropriate recommendations, orders and referrals were placed as needed per Summary below and patient instructions.   Review of Medical History: -PMH, [place], Family History and current specialty and care providers reviewed and updated and listed below   Patient Care Team: Laurey Morale, MD as PCP - General (Family Medicine) [name], MD as PCP - Electrophysiology (Cardiology) [name], [name], MD as PCP - Cardiology (Cardiology) Heath Lark, MD as Consulting Physician (Hematology and Oncology) Melissa Montane, MD as Consulting Physician (Otolaryngology) Eppie Gibson, MD as Attending Physician (Radiation Oncology) Holley Bouche, NP (Inactive) as Nurse Practitioner (Nurse Practitioner) [name], [name], MD as Consulting Physician (Cardiology) [place], Heather, [place] (Optometry) Viona Gilmore, [facility] (Inactive) as Pharmacist (Pharmacist)   Past Medical History:  Diagnosis Date   ARTHRITIS 10/22/2008   BPH (benign prostatic hyperplasia)    CHF (congestive heart failure) [facility])    sees Dr. Peter [name]    Dilated cardiomyopathy [facility])    DIVERTICULOSIS, COLON 12/08/2007   Glaucoma    sees Dr. Heather [place]    H/O asbestos exposure    History of echocardiogram 05/22/2007   EF was 45-50% / Mild concentric LV hypertrophy with mild global hypokinesis and overall mild systolic dysfunction .  Mild AV sclerosis / Mild Mitral insufficiency / compared to prior study 04/24/02 -- LV function has improved further.     History of kidney stones    Hypercholesterolemia    Hypertension    Insomnia 06/11/2015   Radiation 01/01/15-02/18/15   base of tongue and bilateral neck 70 Gy   Skin cancer    squamous cell, basal cell   Squamous cell carcinoma 11/20/2014   base of tongue primary   Throat cancer ([place]) 10/2014   had chemo   Thyroid disease     Past Surgical History:  Procedure Laterality Date   CARDIAC CATHETERIZATION  10/17/2001   EF estimated at 30% / moderate LV enlargement   / 1. Minimal nonobstructive atherosclerotic coronary artery disease / 2. Severe LV dysfunction with global hypokinesia consistent with dilated nonischemic cardiomyopath / 3. Moderate pulmonary hypertension   COLONOSCOPY  06/23/2017   per Dr. Carlean Purl, clear, no repeats needed    CYSTOSCOPY N/A 08/26/2022   Procedure: CYSTOSCOPY;  Surgeon: Raynelle Bring, MD;  Location: [facility];  Service: Urology;  Laterality: N/A;  90 MINUTES NEEDED FOR CASE  ANESTHESIA IS GENERAL OR SPINAL   ICD IMPLANT N/A 04/02/2021   Procedure: ICD IMPLANT;  Surgeon: [name], MD;  Location: [place] [facility];  Service: Cardiovascular;  Laterality: N/A;   LYMPH NODE BIOPSY     RIGHT/LEFT HEART CATH AND CORONARY ANGIOGRAPHY N/A 07/22/2020   Procedure: RIGHT/LEFT HEART CATH AND CORONARY ANGIOGRAPHY;  Surgeon: [name], [name], MD;  Location: [place] [facility];  Service: Cardiovascular;  Laterality: N/A;   TRANSURETHRAL RESECTION OF PROSTATE N/A 08/26/2022   Procedure: TRANSURETHRAL RESECTION OF THE PROSTATE (TURP);  Surgeon: Raynelle Bring, MD;  Location: [facility];  Service: Urology;  Laterality: N/A;    Social History   Socioeconomic History   Marital status: Married    Spouse name: Not on file   Number of children: 4   Years of education: Not on file   Highest education level: Not on file  Occupational History   Occupation: plant Lobbyist: RETIRED    Comment: retired  Tobacco Use   Smoking status: Never   Smokeless tobacco:  Never  Vaping Use   Vaping Use: Never used  Substance and Sexual Activity   Alcohol use: Yes    Alcohol/week: 0.0 standard drinks of alcohol    Comment: occasional beer, a couple times a month   Drug use: No   Sexual activity: Not on file    Comment: retired Pension scheme manager, married  Other Topics Concern   Not on file  Social History Narrative   Married, retired Pension scheme manager 4 daughters, one of whom is a Marine scientist. Several grandchildren who he loves to play with.    1 coffee daily   02/21/2017   Social Determinants of Health   Financial Resource Strain: Medium Risk (09/13/2022)   Overall Financial Resource Strain (CARDIA)    Difficulty of Paying Living Expenses: Somewhat hard  Food Insecurity: No Food Insecurity (10/20/2022)   Hunger Vital Sign    Worried About Running Out of Food in the Last Year: Never true    Ran Out of Food in the Last Year: Never true  Transportation Needs: No Transportation Needs (10/20/2022)   PRAPARE - Hydrologist (Medical): No    Lack of Transportation (Non-Medical): No  Physical Activity: Sufficiently Active (11/13/2021)   Exercise Vital Sign    Days of Exercise per Week: 7 days    Minutes of Exercise per Session: 60 min  Stress: No Stress Concern Present (11/13/2021)   [place]    Feeling of Stress : Not at all  Social Connections: Socially Integrated (11/13/2021)   Social Connection and Isolation Panel [NHANES]    Frequency of Communication with Friends and Family: More than three times a week    Frequency of Social Gatherings with Friends and Family: More than three times a week    Attends Religious Services: More than 4 times per year    Active Member of [employer] or Organizations: Yes    Attends Music therapist: More than 4 times per year    Marital Status: Married  Human resources officer Violence: Not At Risk (08/26/2022)   Humiliation, Afraid, Rape, and Kick questionnaire    Fear of Current or Ex-Partner: No    Emotionally Abused: No    Physically Abused: No    Sexually Abused: No    Family History  Problem Relation Age of Onset   Stroke Father    Cancer Father        kidney ca   Angina Mother    Colon cancer Neg Hx    Esophageal cancer Neg Hx    Rectal cancer Neg Hx    Stomach cancer Neg Hx     Current Outpatient Medications on File Prior to Visit  Medication Sig Dispense Refill   aspirin EC 81 MG  tablet Take 81 mg by mouth daily. Swallow whole.     carvedilol (COREG) 25 MG tablet TAKE 1 TABLET BY MOUTH TWICE DAILY WITH A MEAL . APPOINTMENT REQUIRED FOR FUTURE REFILLS 180 tablet 0   dorzolamide-timolol (COSOPT) 22.3-6.8 MG/ML ophthalmic solution Place 1 drop into both eyes 2 (two) times daily.     empagliflozin (JARDIANCE) 10 MG TABS tablet Take 1 tablet (10 mg total) by mouth daily before breakfast. 90 tablet 3   furosemide (LASIX) 40 MG tablet Take 1 tablet (40 mg total) by mouth daily. 90 tablet 3   levothyroxine (SYNTHROID) 75 MCG tablet TAKE 1 TABLET BY MOUTH ONCE DAILY 6 IN THE MORNING . APPOINTMENT REQUIRED FOR FUTURE  REFILLS 90 tablet 3   Melatonin 10 MG TABS Take 20 mg by mouth at bedtime.     mexiletine (MEXITIL) 250 MG capsule Take 1 capsule (250 mg total) by mouth 2 (two) times daily. 60 capsule 3   potassium chloride (KLOR-CON) 10 MEQ tablet Take 1 tablet (10 mEq total) by mouth daily. 30 tablet 3   ROCKLATAN 0.02-0.005 % SOLN Place 1 drop into both eyes at bedtime.     rosuvastatin (CRESTOR) 5 MG tablet Take 1 tablet (5 mg total) by mouth every other day. (Patient taking differently: Take 2.5 mg by mouth daily.) 45 tablet 3   sacubitril-valsartan (ENTRESTO) 49-51 MG Take 1 tablet by mouth 2 (two) times daily. 180 tablet 3   sodium fluoride (FLUORISHIELD) 1.1 % GEL dental gel PLACE 1 APPLICATION ONTO THE TEETH AT BEDTIME 112 mL 11   No current facility-administered medications on file prior to visit.    No Known Allergies     Physical Exam There were no vitals filed for this visit. Estimated body mass index is 22 kg/m as calculated from the following:   Height as of 11/08/22: '5\' 9"'$  (1.753 m).   Weight as of this encounter: 149 lb (67.6 kg).  EKG (optional): deferred due to virtual visit  GENERAL: alert, oriented, no acute distress detected; full vision exam deferred due to pandemic and/or virtual encounter   HEENT: atraumatic, conjunttiva clear, no obvious  abnormalities on inspection of external nose and ears  NECK: normal movements of the head and neck  LUNGS: on inspection no signs of respiratory distress, breathing rate appears normal, no obvious gross SOB, gasping or wheezing  CV: no obvious cyanosis  MS: moves all visible extremities without noticeable abnormality  PSYCH/NEURO: pleasant and cooperative, no obvious depression or anxiety, speech and thought processing grossly intact, Cognitive function grossly intact  Flowsheet Row Video Visit from 11/16/2022 in [facility] [place]  PHQ-9 Total Score 0           11/16/2022    9:58 AM 10/20/2022   11:12 AM 10/20/2022   10:22 AM 04/21/2022   10:01 AM 03/03/2022    3:09 PM  Depression screen PHQ 2/9  Decreased Interest 0 0 0 0 0  Down, Depressed, Hopeless 0 0 0 0 0  PHQ - 2 Score 0 0 0 0 0  Altered sleeping 0 1  0 0  Tired, decreased energy 0 0  0 0  Change in appetite 0 0  0 0  Feeling bad or failure about yourself  0 0  0 0  Trouble concentrating 0 0  0 0  Moving slowly or fidgety/restless 0 0  0 0  Suicidal thoughts 0 0  0 0  PHQ-9 Score 0 1  0 0  Difficult doing work/chores    Not difficult at all Not difficult at all       08/26/2022    1:45 PM 08/26/2022    7:44 PM 08/27/2022    9:13 AM 10/20/2022   11:12 AM 11/16/2022    9:57 AM  Fall Risk  Falls in the past year?    0 0  Was there an injury with Fall?    0 0  Fall Risk Category Calculator    0 0  Fall Risk Category (Retired)    Low   (RETIRED) Patient Fall Risk Level Moderate fall risk Moderate fall risk Moderate fall risk Low fall risk   Patient at Risk for Falls Due to  No Fall Risks   Fall risk Follow up    Falls evaluation completed      SUMMARY AND PLAN:  Encounter for Medicare annual wellness exam    Discussed applicable health maintenance/preventive health measures and advised and referred or ordered per patient preferences: He adamantly declines vaccines.  Health  Maintenance  Topic Date Due   Hepatitis C Screening  Never done, discussed, answered question   INFLUENZA VACCINE  01/10/2023 (Originally 05/11/2022)   COVID-19 Vaccine (2 - Janssen risk series) 12/03/2027 (Originally 03/08/2020)   Medicare Annual Wellness (AWV)  11/17/2023   Pneumonia Vaccine 36+ Years old  Completed   Zoster Vaccines- Shingrix  Completed   HPV VACCINES  Aged Out   DTaP/Tdap/Td  Discontinued   COLONOSCOPY (Pts 45-21yr Insurance coverage will need to be confirmed)  Discontinued    Education and counseling on the following was provided based on the above review of health and a plan/checklist for the patient, along with additional information discussed, was provided for the patient in the patient instructions :   -Advised and counseled on a whole foods based healthy diet and regular exercise: discussed a heart healthy whole foods based diet at length. A summary of a healthy diet was provided in the Patient Instructions.  -Recommended continued exercise, provided exercise guidelines, balance exercises. -Advise yearly dental visits at minimum and regular eye exams  Follow up: see patient instructions   Patient Instructions  I really enjoyed getting to talk with you today! I am available on Tuesdays and Thursdays for virtual visits if you have any questions or concerns, or if I can be of any further assistance.   CHECKLIST FROM ANNUAL WELLNESS VISIT:  -Follow up (please call to schedule if not scheduled after visit):   -yearly for annual wellness visit with primary care office  Here is a list of your preventive care/health maintenance measures and the plan for each if any are due:  Health Maintenance  Topic Date Due   Hepatitis C Screening  Never done, can request with labs if you wish.   INFLUENZA VACCINE  Noted that you declined, let [place] if any questions.   COVID-19 Vaccine (2 - Janssen risk series) Noted that you declined, let [place]now if any questions.    Medicare Annual Wellness (AWV)  11/17/2023   Pneumonia Vaccine 80 Years old  Completed   Zoster Vaccines- Shingrix  Completed   HPV VACCINES  Aged Out   DTaP/Tdap/Td  Discontinued   COLONOSCOPY (Pts 45-44yrInsurance coverage will need to be confirmed)  Discontinued    -See a dentist at least yearly  -Get your eyes checked and then per your eye specialist's recommendations  -Other issues addressed today:   -I have included below further information regarding a healthy whole foods based diet, physical activity guidelines for adults, stress management and opportunities for social connections. I hope you find this information useful.     NUTRITION: -eat real food: lots of colorful vegetables (half the plate) and fruits -5-7 servings of vegetables and fruits per day (fresh or steamed is best), exp. 2 servings of vegetables with lunch and dinner and 2 servings of fruit per day. Berries and greens such as kale and collards are great choices.  -consume on a regular basis: whole grains (make sure first ingredient on label contains the word "whole"), fresh fruits, fish, nuts, seeds, healthy oils (such as olive oil, avocado oil, grape seed oil) -may eat small amounts of dairy and lean meat on  occasion, but avoid processed meats such as ham, bacon, lunch meat, etc. -drink water -try to avoid fast food and pre-packaged foods, processed meat -most experts advise limiting sodium to < '2300mg'$  per day, should limit further is any chronic conditions such as high blood pressure, heart disease, diabetes, etc. The American Heart Association advised that < '1500mg'$  is is ideal -try to avoid foods that contain any ingredients with names you do not recognize  -try  to avoid sugar/sweets (except for the natural sugar that occurs in fresh fruit) -try to avoid sweet drinks -try to avoid white rice, white bread, pasta (unless whole grain), white or yellow potatoes  EXERCISE GUIDELINES FOR ADULTS: -if you wish to increase your physical activity, do so gradually and with the approval of your doctor -STOP and seek medical care immediately if you have any chest pain, chest discomfort or trouble breathing when starting or increasing exercise  -move and stretch your body, legs, feet and arms when sitting for long periods -Physical activity guidelines for optimal health in adults: -least 150 minutes per week of aerobic exercise (can talk, but not sing) once approved by your doctor, 20-30 minutes of sustained activity or two 10 minute episodes of sustained activity every day.  -resistance training at least 2 days per week if approved by your doctor -balance exercises 3+ days per week:   Stand somewhere where you have something sturdy to hold onto if you lose balance.    1) lift up on toes, start with 5x per day and work up to 20x   2) stand and lift on leg straight out to the side so that foot is a few inches of the floor, start with 5x each side and work up to 20x each side   3) stand on one foot, start with 5 seconds each side and work up to 20 seconds on each side  If you need ideas or help with getting more active:  -Silver sneakers https://tools.silversneakers.com  -Walk with a Doc: http://stephens-thompson.biz/  -try to include resistance (weight lifting/strength building) and balance exercises twice per week: or the following link for ideas: ChessContest.fr  UpdateClothing.com.cy  STRESS MANAGEMENT: -can try meditating, or just sitting quietly with deep breathing while intentionally relaxing all parts of your body for 5 minutes daily -if you need further help with  stress, anxiety or depression please follow up with your primary doctor or contact the wonderful folks at [place]: [place]: -options in [place] if you wish to engage in more social and exercise related activities:  -Silver sneakers https://tools.silversneakers.com  -Walk with a Doc: http://stephens-thompson.biz/  -Check out the [place] 50+ section on the [place] [employer] (hiking clubs, book clubs, cards and games, chess, exercise classes, aquatic classes and much more) - see the website for details: https://www.[place].gov/departments/parks-recreation/active-adults50  -YouTube has lots of exercise videos for different ages and abilities as well  -[place] (a variety of indoor and outdoor inperson activities for adults). 205-402-8537. [address].  -Virtual Online Classes (a variety of topics): see seniorplanet.org or call 5864111279  -consider volunteering at a school, hospice center, church, senior center or elsewhere           Lucretia Kern, DO

## 2022-11-19 ENCOUNTER — Other Ambulatory Visit: Payer: Self-pay | Admitting: Cardiology

## 2022-11-22 ENCOUNTER — Telehealth: Payer: Self-pay | Admitting: Cardiology

## 2022-11-22 NOTE — Telephone Encounter (Signed)
Patient stated he stopped taking Mexitil on 2/10 due to experiencing dizziness. Patient denies any other symptoms, will like to know if there is another medication he can take. Will forward to MD and RN.

## 2022-11-22 NOTE — Telephone Encounter (Signed)
Pt c/o medication issue:  1. Name of Medication: mexiletine (MEXITIL) 250 MG capsule   2. How are you currently taking this medication (dosage and times per day)? 1 tablet twice a day  3. Are you having a reaction (difficulty breathing--STAT)? no  4. What is your medication issue? Patient states he cannot take the medication. He says he is sleeping only 2-3 hours a night and when he is awake he is dizzy.

## 2022-11-23 ENCOUNTER — Encounter: Payer: Self-pay | Admitting: Cardiology

## 2022-11-24 DIAGNOSIS — H401131 Primary open-angle glaucoma, bilateral, mild stage: Secondary | ICD-10-CM | POA: Diagnosis not present

## 2022-11-24 MED ORDER — MEXILETINE HCL 150 MG PO CAPS: 150.0000 mg | ORAL_CAPSULE | Freq: Two times a day (BID) | ORAL | 2 refills | Status: DC

## 2022-11-24 MED ORDER — AMIODARONE HCL 200 MG PO TABS: 200.0000 mg | ORAL_TABLET | Freq: Two times a day (BID) | ORAL | 1 refills | Status: DC

## 2022-12-17 ENCOUNTER — Telehealth: Payer: Self-pay

## 2022-12-17 NOTE — Progress Notes (Unsigned)
Patient ID: Nicholas Wilkerson, male   DOB: [birth year], 82 y.o.   MRN: [id number]  Care Management & Coordination Services Pharmacy Team  Reason for Encounter: Hypertension  Contacted patient to discuss hypertension disease state. {US HC Outreach:28874}    Current antihypertensive regimen:  Carvedilol '25mg'$ , 1 tablet twice daily   Spironolactone 25 mg 1 tablet daily  Patient verbally confirms he is taking the above medications as directed. {yes/no:20286}  How often are you checking your Blood Pressure? {CHL HP BP Monitoring Frequency:[phone]}  he checks his blood pressure {timing:25218} {before/after:25217} taking his medication. Levada Dy June/july Current home BP readings: ***  DATE:             BP               PULSE   Wrist or arm cuff: Caffeine intake: Salt intake: OTC medications including pseudoephedrine or NSAIDs?  Any readings above 180/100? {yes/no:20286} If yes any symptoms of hypertensive emergency? {hypertensive emergency symptoms:25354}  What recent interventions/DTPs have been made by any provider to improve Blood Pressure control since last CPP Visit: ***  Any recent hospitalizations or ED visits since last visit with CPP? {yes/no:20286}  What diet changes have been made to improve Blood Pressure Control?  ***  What exercise is being done to improve your Blood Pressure Control?  ***  Adherence Review: Is the patient currently on ACE/ARB medication? Yes Does the patient have >5 day gap between last estimated fill dates? No  Star Rating Drugs:  Rosuvastatin 5 mg - Last filled 12/08/22 90 DS at [facility]    Chart Updates: Recent office visits:  11/16/22 Lucretia Kern, DO - Patient presented for Medicare Annual Wellness Exam. No medication changes.   10/20/22 Laurey Morale, MD - Patient presented for Preventative health care and other concerns. Stopped Cefdinir.   Recent consult visits:  11/08/22 [name], MD (Cardiology) - Patient presented for  NSVT and other concerns. Prescribed Mexiletine. Stopped Veterinary surgeon.  Hospital visits:  Medication Reconciliation was completed by comparing discharge summary, patient's EMR and Pharmacy list, and upon discussion with patient.  Patient presented to [facility] on 08/26/22 due to Bladder outlet obstruction. Patient was present for 32 hours.  New?Medications Started at [facility] Discharge:?? -started  cefdinir (OMNICEF) phenazopyridine (Pyridium)  Medication Changes at Hospital Discharge: -Changed  None  Medications Discontinued at Hospital Discharge: -Stopped  aspirin EC 81 MG tablet multivitamin with minerals Tabs tablet tamsulosin 0.4 MG Caps capsule (FLOMAX) triamcinolone cream 0.1 % (KENALOG)  Medications that remain the same after Hospital Discharge:??  -All other medications will remain the same.    Medications: Outpatient Encounter Medications as of 12/17/2022  Medication Sig   mexiletine (MEXITIL) 150 MG capsule Take 1 capsule (150 mg total) by mouth 2 (two) times daily.   aspirin EC 81 MG tablet Take 81 mg by mouth daily. Swallow whole.   carvedilol (COREG) 25 MG tablet Take 1 tablet (25 mg total) by mouth 2 (two) times daily with a meal.   dorzolamide-timolol (COSOPT) 22.3-6.8 MG/ML ophthalmic solution Place 1 drop into both eyes 2 (two) times daily.   empagliflozin (JARDIANCE) 10 MG TABS tablet Take 1 tablet (10 mg total) by mouth daily before breakfast.   furosemide (LASIX) 40 MG tablet Take 1 tablet (40 mg total) by mouth daily.   levothyroxine (SYNTHROID) 75 MCG tablet TAKE 1 TABLET BY MOUTH ONCE DAILY 6 IN THE MORNING . APPOINTMENT REQUIRED FOR FUTURE REFILLS   Melatonin 10 MG TABS  Take 20 mg by mouth at bedtime.   potassium chloride (KLOR-CON) 10 MEQ tablet Take 1 tablet (10 mEq total) by mouth daily.   ROCKLATAN 0.02-0.005 % SOLN Place 1 drop into both eyes at bedtime.   rosuvastatin (CRESTOR) 5 MG tablet Take 1 tablet (5 mg total) by mouth every other  day. (Patient taking differently: Take 2.5 mg by mouth daily.)   sacubitril-valsartan (ENTRESTO) 49-51 MG Take 1 tablet by mouth 2 (two) times daily.   No facility-administered encounter medications on file as of 12/17/2022.    Recent Office Vitals: BP Readings from Last 3 Encounters:  11/08/22 126/72  10/20/22 110/74  09/01/22 (!) 98/58   Pulse Readings from Last 3 Encounters:  11/08/22 69  10/20/22 71  09/01/22 70    Wt Readings from Last 3 Encounters:  11/16/22 149 lb (67.6 kg)  11/08/22 154 lb (69.9 kg)  10/20/22 154 lb 9.6 oz (70.1 kg)     Kidney Function Lab Results  Component Value Date/Time   CREATININE 1.79 (H) 10/25/2022 09:32 AM   CREATININE 1.28 (H) 08/27/2022 03:41 AM   CREATININE 1.35 (H) 11/19/2016 11:54 AM   CREATININE 1.3 06/21/2016 08:49 AM   CREATININE 1.3 12/12/2015 10:28 AM   GFR 35.69 (L) 10/25/2022 09:32 AM   GFRNONAA 57 (L) 08/27/2022 03:41 AM   GFRAA 63 10/07/2020 11:12 AM       Latest Ref Rng & Units 10/25/2022    9:32 AM 08/27/2022    3:41 AM 08/26/2022   10:42 AM  BMP  Glucose 70 - 99 mg/dL 104  121  123   BUN 6 - 23 mg/dL 34  25  27   Creatinine 0.40 - 1.50 mg/dL 1.79  1.28  1.58   Sodium 135 - 145 mEq/L 138  139  139   Potassium 3.5 - 5.1 mEq/L 4.3  4.4  4.2   Chloride 96 - 112 mEq/L 102  108  106   CO2 19 - 32 mEq/L '28  26  29   '$ Calcium 8.4 - 10.5 mg/dL 9.6  9.1  9.0        Ned Clines CMA Clinical Pharmacist Assistant [phone]

## 2022-12-19 ENCOUNTER — Other Ambulatory Visit: Payer: Self-pay | Admitting: Family Medicine

## 2022-12-30 DIAGNOSIS — L814 Other melanin hyperpigmentation: Secondary | ICD-10-CM | POA: Diagnosis not present

## 2022-12-30 DIAGNOSIS — Z85828 Personal history of other malignant neoplasm of skin: Secondary | ICD-10-CM | POA: Diagnosis not present

## 2022-12-30 DIAGNOSIS — D0462 Carcinoma in situ of skin of left upper limb, including shoulder: Secondary | ICD-10-CM | POA: Diagnosis not present

## 2022-12-30 DIAGNOSIS — L570 Actinic keratosis: Secondary | ICD-10-CM | POA: Diagnosis not present

## 2022-12-30 DIAGNOSIS — L821 Other seborrheic keratosis: Secondary | ICD-10-CM | POA: Diagnosis not present

## 2022-12-31 ENCOUNTER — Ambulatory Visit (INDEPENDENT_AMBULATORY_CARE_PROVIDER_SITE_OTHER): Payer: PPO

## 2022-12-31 DIAGNOSIS — I428 Other cardiomyopathies: Secondary | ICD-10-CM | POA: Diagnosis not present

## 2022-12-31 LAB — CUP PACEART REMOTE DEVICE CHECK
Battery Remaining Longevity: 106 mo
Battery Remaining Percentage: 83 %
Battery Voltage: 3.01 V
Brady Statistic RV Percent Paced: 1 %
Date Time Interrogation Session: 20240322030053
HighPow Impedance: 68 Ohm
Implantable Lead Connection Status: 753985
Implantable Lead Implant Date: 20220623
Implantable Lead Location: 753860
Implantable Lead Model: 7122
Implantable Pulse Generator Implant Date: 20220623
Lead Channel Impedance Value: 350 Ohm
Lead Channel Pacing Threshold Amplitude: 0.75 V
Lead Channel Pacing Threshold Pulse Width: 0.5 ms
Lead Channel Sensing Intrinsic Amplitude: 11.8 mV
Lead Channel Setting Pacing Amplitude: 2 V
Lead Channel Setting Pacing Pulse Width: 0.5 ms
Lead Channel Setting Sensing Sensitivity: 0.5 mV
Pulse Gen Serial Number: 810027079
Zone Setting Status: 755011

## 2023-01-04 ENCOUNTER — Telehealth: Payer: Self-pay

## 2023-01-04 NOTE — Progress Notes (Signed)
Patient ID: Nicholas Wilkerson, male   DOB: Jul 14, 1941, 82 y.o.   MRN: [id number]  Care Management & Coordination Services Pharmacy Team  Reason for Encounter: Offer Follow up  Contacted patient to schedule follow up with Alda Ponder D, patient aware and in agreement.   Medications: Outpatient Encounter Medications as of 01/04/2023  Medication Sig   aspirin EC 81 MG tablet Take 81 mg by mouth daily. Swallow whole.   carvedilol (COREG) 25 MG tablet Take 1 tablet (25 mg total) by mouth 2 (two) times daily with a meal.   dorzolamide-timolol (COSOPT) 22.3-6.8 MG/ML ophthalmic solution Place 1 drop into both eyes 2 (two) times daily.   empagliflozin (JARDIANCE) 10 MG TABS tablet Take 1 tablet (10 mg total) by mouth daily before breakfast.   furosemide (LASIX) 40 MG tablet Take 1 tablet (40 mg total) by mouth daily.   levothyroxine (SYNTHROID) 75 MCG tablet TAKE 1 TABLET BY MOUTH ONCE DAILY 6 IN THE MORNING . APPOINTMENT REQUIRED FOR FUTURE REFILLS   Melatonin 10 MG TABS Take 20 mg by mouth at bedtime.   mexiletine (MEXITIL) 150 MG capsule Take 1 capsule (150 mg total) by mouth 2 (two) times daily.   potassium chloride (KLOR-CON) 10 MEQ tablet Take 1 tablet (10 mEq total) by mouth daily.   ROCKLATAN 0.02-0.005 % SOLN Place 1 drop into both eyes at bedtime.   rosuvastatin (CRESTOR) 5 MG tablet TAKE 1 TABLET BY MOUTH EVERY OTHER DAY   sacubitril-valsartan (ENTRESTO) 49-51 MG Take 1 tablet by mouth 2 (two) times daily.   No facility-administered encounter medications on file as of 01/04/2023.    Recent vitals BP Readings from Last 3 Encounters:  11/08/22 126/72  10/20/22 110/74  09/01/22 (!) 98/58   Pulse Readings from Last 3 Encounters:  11/08/22 69  10/20/22 71  09/01/22 70   Wt Readings from Last 3 Encounters:  11/16/22 149 lb (67.6 kg)  11/08/22 154 lb (69.9 kg)  10/20/22 154 lb 9.6 oz (70.1 kg)   BMI Readings from Last 3 Encounters:  11/16/22 22.00 kg/m  11/08/22 22.74 kg/m   10/20/22 22.83 kg/m    Recent lab results    Component Value Date/Time   NA 138 10/25/2022 0932   NA 146 (H) 06/23/2022 0906   NA 141 12/12/2015 1028   K 4.3 10/25/2022 0932   K 4.7 12/12/2015 1028   CL 102 10/25/2022 0932   CO2 28 10/25/2022 0932   CO2 28 12/12/2015 1028   GLUCOSE 104 (H) 10/25/2022 0932   GLUCOSE 103 12/12/2015 1028   BUN 34 (H) 10/25/2022 0932   BUN 17 06/23/2022 0906   BUN 15.2 06/21/2016 0849   CREATININE 1.79 (H) 10/25/2022 0932   CREATININE 1.35 (H) 11/19/2016 1154   CREATININE 1.3 06/21/2016 0849   CALCIUM 9.6 10/25/2022 0932   CALCIUM 9.1 12/12/2015 1028    Lab Results  Component Value Date   CREATININE 1.79 (H) 10/25/2022   GFR 35.69 (L) 10/25/2022   EGFR 35 (L) 06/23/2022   GFRNONAA 57 (L) 08/27/2022   GFRAA 63 10/07/2020   Lab Results  Component Value Date/Time   HGBA1C 6.0 10/25/2022 09:32 AM   HGBA1C 6.1 (H) 08/26/2022 01:39 PM    Lab Results  Component Value Date   CHOL 154 10/25/2022   HDL 48.10 10/25/2022   LDLCALC 91 10/25/2022   TRIG 76.0 10/25/2022   CHOLHDL 3 10/25/2022    Care Gaps: Hep C Screen - Overdue Flu Vaccine - Postponed COVID  Vaccine - Postponed AWV - 11/16/22  Star Rating Drugs:  Rosuvastatin 5 mg - Last filled 12/08/22 90 DS at [place] Pharmacist Assistant [phone]

## 2023-01-17 DIAGNOSIS — R972 Elevated prostate specific antigen [PSA]: Secondary | ICD-10-CM | POA: Diagnosis not present

## 2023-01-18 ENCOUNTER — Other Ambulatory Visit: Payer: Self-pay | Admitting: Cardiology

## 2023-01-21 DIAGNOSIS — N401 Enlarged prostate with lower urinary tract symptoms: Secondary | ICD-10-CM | POA: Diagnosis not present

## 2023-01-21 DIAGNOSIS — R972 Elevated prostate specific antigen [PSA]: Secondary | ICD-10-CM | POA: Diagnosis not present

## 2023-01-21 DIAGNOSIS — R351 Nocturia: Secondary | ICD-10-CM | POA: Diagnosis not present

## 2023-01-27 NOTE — Progress Notes (Signed)
Care Management & Coordination Services Pharmacy Note  02/07/2023 Name:  Nicholas Wilkerson MRN:  [id number] DOB:  02-26-1941  Summary: BP at goal <130/80, denies any signs of fluid retention LDL not at goal <70 (at 91 10/2022)  Recommendations/Changes made from today's visit: -Counseled to check weight daily and keep a log, notify cardiologist or [place] if see >3lbs in 1 day or >5lbs in one week -Counseled to check BP/HR 2-3x/week and keep a log -Counseled on cholesterol-lowering diet and importance of adherence to statin  Follow up plan: BP in 3 months Pharmacist visit in 6 months   Subjective: Nicholas Wilkerson is an 82 y.o. year old male who is a primary patient of Nelwyn Salisbury, MD.  The care coordination team was consulted for assistance with disease management and care coordination needs.    Engaged with patient by telephone for follow up visit.  Recent office visits: 11/16/22 Terressa Koyanagi, DO - Patient presented for Medicare Annual Wellness Exam. No medication changes.    10/20/22 Nelwyn Salisbury, MD - Patient presented for Preventative health care and other concerns. Stopped Cefdinir.   Recent consult visits: 11/08/22 Regan Lemming, MD (Cardiology) - Patient presented for NSVT and other concerns. Prescribed Mexiletine. Stopped Personal assistant.   Hospital visits: 08/26/22 [facility] - For Bladder Obstruction, LOS 32 Hours. START Cefdinir and Azo, STOP Aspirin, MVI, Flomax, Kenalog   Objective:  Lab Results  Component Value Date   CREATININE 1.79 (H) 10/25/2022   BUN 34 (H) 10/25/2022   GFR 35.69 (L) 10/25/2022   EGFR 35 (L) 06/23/2022   GFRNONAA 57 (L) 08/27/2022   GFRAA 63 10/07/2020   NA 138 10/25/2022   K 4.3 10/25/2022   CALCIUM 9.6 10/25/2022   CO2 28 10/25/2022   GLUCOSE 104 (H) 10/25/2022    Lab Results  Component Value Date/Time   HGBA1C 6.0 10/25/2022 09:32 AM   HGBA1C 6.1 (H) 08/26/2022 01:39 PM   GFR 35.69 (L) 10/25/2022 09:32 AM   GFR  35.11 (L) 04/21/2022 09:56 AM    Last diabetic Eye exam: No results found for: "HMDIABEYEEXA"  Last diabetic Foot exam: No results found for: "HMDIABFOOTEX"   Lab Results  Component Value Date   CHOL 154 10/25/2022   HDL 48.10 10/25/2022   LDLCALC 91 10/25/2022   TRIG 76.0 10/25/2022   CHOLHDL 3 10/25/2022       Latest Ref Rng & Units 10/25/2022    9:32 AM 01/07/2022   10:07 AM 07/15/2020    4:48 PM  Hepatic Function  Total Protein 6.0 - 8.3 g/dL 6.8  6.2  6.7   Albumin 3.5 - 5.2 g/dL 4.3  4.6  4.7   AST 0 - 37 U/L 18  22  20    ALT 0 - 53 U/L 13  19  21    Alk Phosphatase 39 - 117 U/L 73  92  85   Total Bilirubin 0.2 - 1.2 mg/dL 0.6  0.3  0.5   Bilirubin, Direct 0.0 - 0.3 mg/dL 0.1  4.09      Lab Results  Component Value Date/Time   TSH 0.97 10/25/2022 09:32 AM   TSH 1.03 03/03/2022 03:51 PM   FREET4 0.91 03/03/2022 03:51 PM   FREET4 1.02 10/15/2020 11:51 AM       Latest Ref Rng & Units 10/25/2022    9:32 AM 08/20/2022   10:42 AM 03/27/2021   12:14 PM  CBC  WBC 4.0 - 10.5 K/uL 6.6  8.5  10.7   Hemoglobin 13.0 - 17.0 g/dL 16.1  09.6  04.5   Hematocrit 39.0 - 52.0 % 46.1  47.1  48.1   Platelets 150.0 - 400.0 K/uL 235.0  272  225     No results found for: "VD25OH", "VITAMINB12"  Clinical ASCVD: No  The 10-year ASCVD risk score (Arnett DK, et al., 2019) is: 33.3%   Values used to calculate the score:     Age: 27 years     Sex: Male     Is Non-Hispanic African American: No     Diabetic: No     Tobacco smoker: No     Systolic Blood Pressure: 126 mmHg     Is BP treated: Yes     HDL Cholesterol: 48.1 mg/dL     Total Cholesterol: 154 mg/dL       4/0/9811    9:14 AM 10/20/2022   11:12 AM 10/20/2022   10:22 AM  Depression screen PHQ 2/9  Decreased Interest 0 0 0  Down, Depressed, Hopeless 0 0 0  PHQ - 2 Score 0 0 0  Altered sleeping 0 1   Tired, decreased energy 0 0   Change in appetite 0 0   Feeling bad or failure about yourself  0 0   Trouble concentrating  0 0   Moving slowly or fidgety/restless 0 0   Suicidal thoughts 0 0   PHQ-9 Score 0 1      Social History   Tobacco Use  Smoking Status Never  Smokeless Tobacco Never   BP Readings from Last 3 Encounters:  11/08/22 126/72  10/20/22 110/74  09/01/22 (!) 98/58   Pulse Readings from Last 3 Encounters:  11/08/22 69  10/20/22 71  09/01/22 70   Wt Readings from Last 3 Encounters:  11/16/22 149 lb (67.6 kg)  11/08/22 154 lb (69.9 kg)  10/20/22 154 lb 9.6 oz (70.1 kg)   BMI Readings from Last 3 Encounters:  11/16/22 22.00 kg/m  11/08/22 22.74 kg/m  10/20/22 22.83 kg/m    No Known Allergies  Medications Reviewed Today     Reviewed by Sherrill Raring, RPH (Pharmacist) on 02/07/23 at 1021  Med List Status: <None>   Medication Order Taking? Sig Documenting Provider Last Dose Status Informant  aspirin EC 81 MG tablet [id number] Yes Take 81 mg by mouth daily. Swallow whole. [provider] Taking Active   carvedilol (COREG) 25 MG tablet [id number] Yes Take 1 tablet (25 mg total) by mouth 2 (two) times daily with a meal. [place], [name], MD Taking Active   dorzolamide-timolol (COSOPT) 22.3-6.8 MG/ML ophthalmic solution [id number]  Place 1 drop into both eyes 2 (two) times daily. [provider]  Active Self  empagliflozin (JARDIANCE) 10 MG TABS tablet [id number] Yes Take 1 tablet (10 mg total) by mouth daily before breakfast. Nelwyn Salisbury, MD Taking Active   furosemide (LASIX) 40 MG tablet [id number] Yes Take 1 tablet (40 mg total) by mouth daily. [place], [name], MD Taking Active Self  levothyroxine (SYNTHROID) 75 MCG tablet [id number] Yes TAKE 1 TABLET BY MOUTH ONCE DAILY 6 IN THE MORNING . APPOINTMENT REQUIRED FOR FUTURE REFILLS Nelwyn Salisbury, MD Taking Active Self  Melatonin 10 MG TABS [id number]  Take 20 mg by mouth at bedtime. [provider]  Active Self  mexiletine (MEXITIL) 150 MG capsule [id number] Yes TAKE 1 CAPSULE BY MOUTH TWICE A DAY Camnitz,  Will Daphine Deutscher, MD Taking Active   potassium chloride (KLOR-CON) 10  MEQ tablet [id number] Yes Take 1 tablet (10 mEq total) by mouth daily. Croitoru, Mihai, MD Taking Active Self  ROCKLATAN 0.02-0.005 % SOLN [id number]  Place 1 drop into both eyes at bedtime. [provider]  Active Self  rosuvastatin (CRESTOR) 5 MG tablet [id number] Yes TAKE 1 TABLET BY MOUTH EVERY OTHER DAY Nelwyn Salisbury, MD Taking Active            Med Note Barnie Mort Feb 07, 2023 10:21 AM) Taking 1/2 tab daily  sacubitril-valsartan (ENTRESTO) 49-51 MG [id number] Yes Take 1 tablet by mouth 2 (two) times daily. [place], [name], MD Taking Active             SDOH:  (Social Determinants of Health) assessments and interventions performed: Yes SDOH Interventions    Flowsheet Row Care Coordination from 02/07/2023 in [facility] Telephone from 10/20/2022 in [facility] Care Coordination Chronic Care Management from 09/13/2022 in [facility] [place] Clinical Support from 11/13/2021 in [facility] [place] Chronic Care Management from 09/11/2020 in [facility] [place] Clinical Support from 05/26/2020 in [facility] [place]  SDOH Interventions        Food Insecurity Interventions Intervention Not Indicated Intervention Not Indicated -- Intervention Not Indicated -- Intervention Not Indicated  Housing Interventions Intervention Not Indicated Intervention Not Indicated -- Intervention Not Indicated -- Intervention Not Indicated  Transportation Interventions -- Intervention Not Indicated -- Intervention Not Indicated Intervention Not Indicated Intervention Not Indicated  Utilities Interventions -- Intervention Not Indicated -- -- -- --  Depression Interventions/Treatment  -- -- -- -- -- PHQ2-9 Score <4 Follow-up Not Indicated  Financial Strain Interventions -- -- Other (Comment)  [working on PAP  for eye drops] Intervention Not Indicated Other (Comment)  [working on patient assistance for Entresto] Intervention Not Indicated  Physical Activity Interventions -- -- -- Intervention Not Indicated -- Other (Comments)  [Patient states that he cleans his home and does and yard]  Stress Interventions -- -- -- -- -- Intervention Not Indicated  Social Connections Interventions -- -- -- Intervention Not Indicated -- Intervention Not Indicated       Medication Assistance:  Entresto through PAP  Medication Access: Within the past 30 days, how often has patient missed a dose of medication? None Is a pillbox or other method used to improve adherence? Yes  Factors that may affect medication adherence? no barriers identified Are meds synced by current pharmacy? No  Are meds delivered by current pharmacy? No  Does patient experience delays in picking up medications due to transportation concerns? No   [facility] Reviewed: Is patient disadvantaged to use [facility]?: Yes  Current Rx insurance plan: HTA Name and location of Current pharmacy:  [id number] - [address] ([address]), [place] - [address] [place] ([place]) [place] [address] Phone: [phone] Fax: [phone]  [facility] [place], [place] - [address] [place] [address] Phone: [phone] Fax: [phone]  [id number] - [place], [place] - [address] [place] [address] Phone: [phone] Fax: [phone]  [facility] services reviewed with patient today?: No  Patient requests to transfer care to [facility]?: No  Reason patient declined to change pharmacies: Disadvantaged due to insurance/mail order  Compliance/Adherence/Medication fill history: Care Gaps: Hep C Screening  Star-Rating Drugs: Rosuvastatin 5mg  PDC 99% Jardiance 10mg   PDC 100% Entresto 49-51mg  PDC 0% - getting  through PAP    Assessment/Plan Hyperlipidemia: (LDL goal < 70) -Not ideally controlled -Current treatment: Rosuvastatin 5mg  1/2 tab qd Appropriate, Query effective -Medications previously tried: None  -Current dietary patterns: reports healthy diet with no fried foods and limited fatty foods -Current exercise habits: not discussed -Educated on Cholesterol goals;  Benefits of statin for ASCVD risk reduction; Importance of limiting foods high in cholesterol; Exercise goal of 150 minutes per week; -Counseled on diet and exercise extensively Recommended to continue current medication  Heart Failure (Goal: manage symptoms and prevent exacerbations) -Controlled -Last ejection fraction: 20-25% (Date: 01/05/21) -HF type: HFrEF (EF < 40%) -NYHA Class: II (slight limitation of activity) -AHA HF Stage: C (Heart disease and symptoms present) -Current treatment: Entresto 49-51mg  1 BID Appropriate, Effective, Safe, Accessible Carvedilol 25mg  1 BID Appropriate, Effective, Safe, Accessible Lasix 40mg  1 QD Appropriate, Effective, Safe, Accessible Jardiance 10mg  1 qd Appropriate, Effective, Safe, Accessible -Medications previously tried: Spironolactone  -Current home BP/HR readings: 110/72 -Current home daily weights: 148-150lbs on a regular basis -Current dietary habits: does not add salt to food and is very mindful of sodium content -Current exercise habits: see above -Educated on Benefits of medications for managing symptoms and prolonging life Proper diuretic administration and potassium supplementation Importance of blood pressure control -Recommended to continue current medication  Sherrill Raring Clinical Pharmacist [phone]

## 2023-02-01 NOTE — Progress Notes (Signed)
Remote ICD transmission.   

## 2023-02-04 ENCOUNTER — Telehealth: Payer: Self-pay

## 2023-02-04 NOTE — Progress Notes (Signed)
Care Management & Coordination Services Pharmacy Team  Reason for Encounter: Appointment Reminder  Contacted patient to confirm telephone appointment with Delano Metz, PharmD on 02/07/2023 at 9:30. Spoke with patient on 02/04/2023   Do you have any problems getting your medications? No  What is your top health concern you would like to discuss at your upcoming visit? Patient denies  Have you seen any other providers since your last visit with PCP? No  Care Gaps: AWV - completed 11/16/2022 Last BP - 126/72 on 11/08/2022 Hep C Screen - never done Covid  - postponed   Star Rating Drug:   Rosuvastatin 5 mg - Last filled 12/08/2022 90 DS at [facility]  Clinical Pharmacist Assistant [phone]

## 2023-02-07 ENCOUNTER — Ambulatory Visit: Payer: PPO

## 2023-02-07 ENCOUNTER — Ambulatory Visit: Payer: PPO | Admitting: Student

## 2023-02-09 ENCOUNTER — Other Ambulatory Visit: Payer: Self-pay | Admitting: Family Medicine

## 2023-02-21 ENCOUNTER — Other Ambulatory Visit: Payer: Self-pay | Admitting: Cardiology

## 2023-02-21 DIAGNOSIS — H401112 Primary open-angle glaucoma, right eye, moderate stage: Secondary | ICD-10-CM | POA: Diagnosis not present

## 2023-02-27 NOTE — Progress Notes (Unsigned)
Cardiology Office Note Date:  02/28/2023  Patient ID:  Nicholas Wilkerson, Nicholas Wilkerson 07-15-1941, MRN [id number] PCP:  Nelwyn Salisbury, MD  Cardiologist:  Dr. Swaziland Electrophysiologist: Dr. Elberta Fortis    Chief Complaint: 3 mo  History of Present Illness: Nicholas Wilkerson is a 82 y.o. male with history of DCM, HTN, HLD, chronic CHF (systolic), ICD, NSVT, SVT, PVCs, oral cancer (s/p XRT), CKD (IIIb)  Saw Dr. Swaziland Sept 2023, recent HF exacerbation following as well with nephrology Dr. Ronalee Belts.  Volume stable at this visit   Most recently saw Dr. Elberta Fortis 11/08/22, fatigued with activities, no symptoms otherwise, recent monitor w/18% PVC burden and some VTs reaching his monitor zone, mexiletine was added.  Pt called c/o dizziness w/mexiletine and started on amiodarone Pt called with porr sleep and diarrhea with the amiodarone (?) and dosing reduced. Though unclear with further messages again though reducing mexiletine dosing.  TODAY He is tolerating mexiletine 150mg  BID well. He confirms never had been on amiodarone No CP, palpitations or cardiac awareness No SOB, DOE He is very active Stained his deck a couple weekds ago, planted his garden, does all the house maintenance. He feels quite well No DOE No near syncope or syncope.  Device information Abbott single chamber ICD implanted 04/02/21  AAD hx Mexiletine started Jan 2024, NSVTs and PVCs    Past Medical History:  Diagnosis Date   ARTHRITIS 10/22/2008   BPH (benign prostatic hyperplasia)    CHF (congestive heart failure) (HCC)    sees Dr. Peter [place]iland    Dilated cardiomyopathy [facility])    DIVERTICULOSIS, COLON 12/08/2007   Glaucoma    sees Dr. Heather [place]    H/O asbestos exposure    History of echocardiogram 05/22/2007   EF was 45-50% / Mild concentric LV hypertrophy with mild global hypokinesis and overall mild systolic dysfunction .  Mild AV sclerosis / Mild Mitral insufficiency / compared to prior study 04/24/02 -- LV  function has improved further.     History of kidney stones    Hypercholesterolemia    Hypertension    Insomnia 06/11/2015   Radiation 01/01/15-02/18/15   base of tongue and bilateral neck 70 Gy   Skin cancer    squamous cell, basal cell   Squamous cell carcinoma 11/20/2014   base of tongue primary   Throat cancer (HCC) 10/2014   had chemo   Thyroid disease     Past Surgical History:  Procedure Laterality Date   CARDIAC CATHETERIZATION  10/17/2001   EF estimated at 30% / moderate LV enlargement  / 1. Minimal nonobstructive atherosclerotic coronary artery disease / 2. Severe LV dysfunction with global hypokinesia consistent with dilated nonischemic cardiomyopath / 3. Moderate pulmonary hypertension   COLONOSCOPY  06/23/2017   per Dr. Leone Payor, clear, no repeats needed    CYSTOSCOPY N/A 08/26/2022   Procedure: CYSTOSCOPY;  Surgeon: Heloise Purpura, MD;  Location: [facility];  Service: Urology;  Laterality: N/A;  90 MINUTES NEEDED FOR CASE  ANESTHESIA IS GENERAL OR SPINAL   ICD IMPLANT N/A 04/02/2021   Procedure: ICD IMPLANT;  Surgeon: Regan Lemming, MD;  Location: [facility] INVASIVE CV LAB;  Service: Cardiovascular;  Laterality: N/A;   LYMPH NODE BIOPSY     RIGHT/LEFT HEART CATH AND CORONARY ANGIOGRAPHY N/A 07/22/2020   Procedure: RIGHT/LEFT HEART CATH AND CORONARY ANGIOGRAPHY;  Surgeon: Swaziland, [name], MD;  Location: [facility] INVASIVE CV LAB;  Service: Cardiovascular;  Laterality: N/A;   TRANSURETHRAL RESECTION OF PROSTATE N/A 08/26/2022  Procedure: TRANSURETHRAL RESECTION OF THE PROSTATE (TURP);  Surgeon: Heloise Purpura, MD;  Location: [facility];  Service: Urology;  Laterality: N/A;    Current Outpatient Medications  Medication Sig Dispense Refill   aspirin EC 81 MG tablet Take 81 mg by mouth daily. Swallow whole.     carvedilol (COREG) 25 MG tablet TAKE 1 TABLET BY MOUTH TWICE DAILY WITH A MEAL 180 tablet 1   dorzolamide-timolol (COSOPT) 22.3-6.8 MG/ML ophthalmic solution Place 1 drop into  both eyes 2 (two) times daily.     empagliflozin (JARDIANCE) 10 MG TABS tablet Take 1 tablet (10 mg total) by mouth daily before breakfast. 90 tablet 3   furosemide (LASIX) 40 MG tablet Take 1 tablet (40 mg total) by mouth daily. 90 tablet 3   levothyroxine (SYNTHROID) 75 MCG tablet TAKE 1 TABLET BY MOUTH AT 6 IN THE MORNING . APPOINTMENT REQUIRED FOR FUTURE REFILLS 90 tablet 0   Melatonin 10 MG TABS Take 20 mg by mouth at bedtime.     mexiletine (MEXITIL) 150 MG capsule TAKE 1 CAPSULE BY MOUTH TWICE A DAY 60 capsule 11   potassium chloride (KLOR-CON) 10 MEQ tablet Take 1 tablet (10 mEq total) by mouth daily. 30 tablet 3   ROCKLATAN 0.02-0.005 % SOLN Place 1 drop into both eyes at bedtime.     rosuvastatin (CRESTOR) 5 MG tablet Take 5 mg by mouth daily. Take 1/2 everyday     sacubitril-valsartan (ENTRESTO) 49-51 MG Take 1 tablet by mouth 2 (two) times daily. 180 tablet 3   No current facility-administered medications for this visit.    Allergies:   Patient has no known allergies.   Social History:  The patient  reports that he has never smoked. He has never used smokeless tobacco. He reports current alcohol use. He reports that he does not use drugs.   Family History:  The patient's family history includes Angina in his mother; Cancer in his father; Stroke in his father.  ROS:  Please see the history of present illness.    All other systems are reviewed and otherwise negative.   PHYSICAL EXAM:  VS:  BP (!) 102/52   Pulse 72   Ht 5\' 9"  (1.753 m)   Wt 155 lb 12.8 oz (70.7 kg)   SpO2 96%   BMI 23.01 kg/m  BMI: Body mass index is 23.01 kg/m. Well nourished, well developed, in no acute distress HEENT: normocephalic, atraumatic Neck: no JVD, carotid bruits or masses Cardiac:  RRR; no significant murmurs, no rubs, or gallops Lungs:  CTA b/l, no wheezing, rhonchi or rales Abd: soft, nontender MS: no deformity or atrophy Ext: no edema Skin: warm and dry, no rash Neuro:  No gross  deficits appreciated Psych: euthymic mood, full affect  ICD site is stable, no tethering or discomfort   EKG:  not done today  Device interrogation done today and reviewed by myself:  Battery and lead measuerments are good 2 NSVT (improved from 20 on his last interrogation) PVCs noted during interrogation     Echo 07/14/20: 1. Left ventricular ejection fraction, by estimation, is 20 to 25%. The left ventricle has severely decreased function. The left ventricle demonstrates global hypokinesis. The left ventricular internal cavity size was mildly dilated. Left ventricular diastolic parameters are consistent with Grade III diastolic dysfunction (restrictive). Elevated left ventricular end-diastolic pressure. 2. Right ventricular systolic function is normal. The right ventricular size is normal. There is severely elevated pulmonary artery systolic pressure. 3. Left atrial size was severely  dilated. 4. The mitral valve is normal in structure. Mild mitral valve regurgitation. No evidence of mitral stenosis. 5. The aortic valve is tricuspid. There is mild calcification of the aortic valve. There is mild thickening of the aortic valve. Aortic valve regurgitation is not visualized. No aortic stenosis is present. 6. The inferior vena cava is normal in size with <50% respiratory variability, suggesting right atrial pressure of 8 mmHg.   Cardiac cath 07/22/20:  RIGHT/LEFT HEART CATH AND CORONARY ANGIOGRAPHY  Conclusion Prox LAD to Mid LAD lesion is 25% stenosed. Prox Cx to Dist Cx lesion is 25% stenosed. Prox RCA to Mid RCA lesion is 15% stenosed. LV end diastolic pressure is moderately elevated. Hemodynamic findings consistent with moderate pulmonary hypertension.   1. Nonobstructive CAD 2. Low cardiac output. Index 1.86 3. Moderately elevated LV filling pressures 4. Moderate Pulmonary HTN   Plan: optimize medical therapy for CHF. On Coreg. Just started [place]. Will add lasix 40 mg  daily. Further titration of medication depending on initial response.      Event monitor 10/03/19: Study Highlights Normal sinus rhythm Occasional runs of NSVT longest lasting 7 beats. Infrequent runs of SVT longest 9 beats. No significant bradycardia or pauses Average HR 71 bpm         Echo 01/05/21: IMPRESSIONS   1. Left ventricular ejection fraction, by estimation, is 20 to 25%. The  left ventricle has severely decreased function. The left ventricle  demonstrates global hypokinesis. The left ventricular internal cavity size  was mildly to moderately dilated. Left  ventricular diastolic parameters are consistent with Grade II diastolic  dysfunction (pseudonormalization). Elevated left atrial pressure.   2. Right ventricular systolic function is normal. The right ventricular  size is normal.   3. Left atrial size was mild to moderately dilated.   4. The mitral valve is normal in structure. Mild mitral valve  regurgitation.   5. The aortic valve is tricuspid. Aortic valve regurgitation is not  visualized. Mild aortic valve sclerosis is present, with no evidence of  aortic valve stenosis.   6. The inferior vena cava is normal in size with greater than 50%  respiratory variability, suggesting right atrial pressure of 3 mmHg.   Comparison(s): The left ventricular function is unchanged.     Recent Labs: 06/23/2022: NT-Pro BNP 6,147 10/25/2022: ALT 13; BUN 34; Creatinine, Ser 1.79; Hemoglobin 15.0; Platelets 235.0; Potassium 4.3; Sodium 138; TSH 0.97  10/25/2022: Cholesterol 154; HDL 48.10; LDL Cholesterol 91; Total CHOL/HDL Ratio 3; Triglycerides 76.0; VLDL 15.2   CrCl cannot be calculated (Patient's most recent lab result is older than the maximum 21 days allowed.).   Wt Readings from Last 3 Encounters:  02/28/23 155 lb 12.8 oz (70.7 kg)  11/16/22 149 lb (67.6 kg)  11/08/22 154 lb (69.9 kg)     Other studies reviewed: Additional studies/records reviewed today include:  summarized above  ASSESSMENT AND PLAN:  ICD Intact function RV outout adjusted for 2:1 VP <1%  VT, PVCs Tolerating current mexiletine dose [place] appears to be improved Check a mag today Sees his nephrologist in a month or so, K+ looked ok in Jan  Chronic CHF DCM No symptoms or exam findings of volume OL CorVue looks ok C/w Dr. Anola Gurney   Disposition: F/u with remotes as usual, in clinic in 38mo with EP again, sooner if needed    Current medicines are reviewed at length with the patient today.  The patient did not have any concerns regarding medicines.  Signed, Francis Dowse,  PA-C 02/28/2023 9:48 AM     [facility] [address] [place] [address] [phone] (office)  [phone] (fax)

## 2023-02-28 ENCOUNTER — Encounter: Payer: Self-pay | Admitting: Physician Assistant

## 2023-02-28 ENCOUNTER — Ambulatory Visit: Payer: PPO | Attending: Student | Admitting: Physician Assistant

## 2023-02-28 VITALS — BP 102/52 | HR 72 | Ht 69.0 in | Wt 155.8 lb

## 2023-02-28 DIAGNOSIS — I493 Ventricular premature depolarization: Secondary | ICD-10-CM

## 2023-02-28 DIAGNOSIS — I4729 Other ventricular tachycardia: Secondary | ICD-10-CM | POA: Diagnosis not present

## 2023-02-28 DIAGNOSIS — I5022 Chronic systolic (congestive) heart failure: Secondary | ICD-10-CM | POA: Diagnosis not present

## 2023-02-28 DIAGNOSIS — I420 Dilated cardiomyopathy: Secondary | ICD-10-CM | POA: Diagnosis not present

## 2023-02-28 DIAGNOSIS — Z79899 Other long term (current) drug therapy: Secondary | ICD-10-CM

## 2023-02-28 DIAGNOSIS — Z95810 Presence of automatic (implantable) cardiac defibrillator: Secondary | ICD-10-CM

## 2023-02-28 LAB — CUP PACEART INCLINIC DEVICE CHECK
Battery Remaining Longevity: 108 mo
Brady Statistic RV Percent Paced: 0.01 %
Date Time Interrogation Session: 20240520102222
HighPow Impedance: 73.125
Implantable Lead Connection Status: 753985
Implantable Lead Implant Date: 20220623
Implantable Lead Location: 753860
Implantable Lead Model: 7122
Implantable Pulse Generator Implant Date: 20220623
Lead Channel Impedance Value: 412.5 Ohm
Lead Channel Pacing Threshold Amplitude: 1.25 V
Lead Channel Pacing Threshold Amplitude: 1.25 V
Lead Channel Pacing Threshold Pulse Width: 0.5 ms
Lead Channel Pacing Threshold Pulse Width: 0.5 ms
Lead Channel Sensing Intrinsic Amplitude: 11.8 mV
Lead Channel Setting Pacing Amplitude: 2.5 V
Lead Channel Setting Pacing Pulse Width: 0.5 ms
Lead Channel Setting Sensing Sensitivity: 0.5 mV
Pulse Gen Serial Number: 810027079
Zone Setting Status: 755011

## 2023-02-28 NOTE — Patient Instructions (Signed)
Medication Instructions:   Your physician recommends that you continue on your current medications as directed. Please refer to the Current Medication list given to you today.    *If you need a refill on your cardiac medications before your next appointment, please call your pharmacy*   Lab Work:  MAG TODAY     If you have labs (blood work) drawn today and your tests are completely normal, you will receive your results only by: MyChart Message (if you have MyChart) OR A paper copy in the mail If you have any lab test that is abnormal or we need to change your treatment, we will call you to review the results.   Testing/Procedures:NONE ORDERED  TODAY      Follow-Up: At [facility], you and your health needs are our priority.  As part of our continuing mission to provide you with exceptional heart care, we have created designated Provider Care Teams.  These Care Teams include your primary Cardiologist (physician) and Advanced Practice Providers (APPs -  Physician Assistants and Nurse Practitioners) who all work together to provide you with the care you need, when you need it.  We recommend signing up for the patient portal called "MyChart".  Sign up information is provided on this After Visit Summary.  MyChart is used to connect with patients for Virtual Visits (Telemedicine).  Patients are able to view lab/test results, encounter notes, upcoming appointments, etc.  Non-urgent messages can be sent to your provider as well.   To learn more about what you can do with MyChart, go to [email or web address].    Your next appointment:    6 month(s)   Provider:   You may see Will Jorja Loa, MD or one of the following Advanced Practice Providers on your designated Care Team:   Francis Dowse, [place]  Other Instructions

## 2023-03-01 LAB — MAGNESIUM: Magnesium: 2.9 mg/dL — ABNORMAL HIGH (ref 1.6–2.3)

## 2023-03-03 ENCOUNTER — Other Ambulatory Visit: Payer: Self-pay | Admitting: *Deleted

## 2023-03-03 DIAGNOSIS — Z79899 Other long term (current) drug therapy: Secondary | ICD-10-CM

## 2023-03-31 ENCOUNTER — Ambulatory Visit: Payer: PPO | Attending: Physician Assistant

## 2023-03-31 DIAGNOSIS — Z79899 Other long term (current) drug therapy: Secondary | ICD-10-CM

## 2023-03-31 LAB — MAGNESIUM: Magnesium: 2.5 mg/dL — ABNORMAL HIGH (ref 1.6–2.3)

## 2023-04-01 ENCOUNTER — Ambulatory Visit: Payer: PPO

## 2023-04-01 DIAGNOSIS — I420 Dilated cardiomyopathy: Secondary | ICD-10-CM

## 2023-04-01 DIAGNOSIS — I5022 Chronic systolic (congestive) heart failure: Secondary | ICD-10-CM

## 2023-04-01 LAB — CUP PACEART REMOTE DEVICE CHECK
Battery Remaining Longevity: 103 mo
Battery Remaining Percentage: 82 %
Battery Voltage: 3.01 V
Brady Statistic RV Percent Paced: 1 %
Date Time Interrogation Session: 20240621020024
HighPow Impedance: 70 Ohm
Implantable Lead Connection Status: 753985
Implantable Lead Implant Date: 20220623
Implantable Lead Location: 753860
Implantable Lead Model: 7122
Implantable Pulse Generator Implant Date: 20220623
Lead Channel Impedance Value: 390 Ohm
Lead Channel Pacing Threshold Amplitude: 1.25 V
Lead Channel Pacing Threshold Pulse Width: 0.5 ms
Lead Channel Sensing Intrinsic Amplitude: 11.8 mV
Lead Channel Setting Pacing Amplitude: 2.5 V
Lead Channel Setting Pacing Pulse Width: 0.5 ms
Lead Channel Setting Sensing Sensitivity: 0.5 mV
Pulse Gen Serial Number: 810027079
Zone Setting Status: 755011

## 2023-04-14 NOTE — Progress Notes (Signed)
Nicholas Wilkerson Date of Birth: Jun 30, 1941   History of Present Illness: Nicholas Wilkerson is seen for followup. He has a history of dilated cardiomyopathy with initial ejection fraction of 30% over 18 years ago. Subsequent evaluation has demonstrated improvement to 50-55%. Last Echo was in January 2018 showing decrease in EF to 35-40%. ACEi was resumed at that time. . 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 He was treated with RT and chemo.   Last November he complained of some palpitations. Event monitor short short bursts of SVT and NSVT. No bradycardia or pauses. We increased his Coreg dose at that time.  He was seen in February and he noted he had extensive Urologic evaluation for an elevated PSA with no cancer found. He did complain of getting tired real easy with activity.  No edema. No chest pain. He does note some change in his breathing.  We updated an Echocardiogram which demonstrated significant decrease in LV function with  EF down to 20-25%. There was also evidence of significant pulmonary HTN.   We performed cardiac cath showing nonobstructive CAD. Moderately elevated LV filling pressures. Moderate pulmonary HTN.  He was started on Lasix 40 mg daily. Continued Coreg and Jardiance. Added Entresto. Now up to 49/51 mg bid. Added aldactone 25 mg daily. He is tolerating this well. He has noted that if he takes everything at once in the morning he gets real lightheaded. He states this is much better.   Still active walking and playing golf.   After optimization of medication Echo was repeated and was unchanged with EF 20-25%. He was seen by Dr Nicholas Wilkerson and ICD was implanted in June. Since then on remote monitoring he has 3 episodes of NSVT the longest lasting 6 seconds. He was unaware.   When seen in September 2023  he had increased SOB and PND. Pro BNP was > 6000. His lasix was increased back to 40 mg daily. Since then his symptoms have improved. No edema. No palpitations. He  has seen Dr Nicholas Wilkerson with [facility] for stage 3b CKD.   He was seen by Dr. Elberta Wilkerson 11/08/22, fatigued with activities, no symptoms otherwise, recent monitor w/18% PVC burden and some VTs reaching his monitor zone, mexiletine was added. Had improved PVC burden on follow up.   On follow up today he is doing great. Stays very active doing home projects. Notes Mexitil makes him dizzy and he doesn't sleep as well. No syncope, dyspnea, or chest pain.      Current Outpatient Medications on File Prior to Visit  Medication Sig Dispense Refill   aspirin EC 81 MG tablet Take 81 mg by mouth daily. Swallow whole.     carvedilol (COREG) 25 MG tablet TAKE 1 TABLET BY MOUTH TWICE DAILY WITH A MEAL 180 tablet 1   dorzolamide-timolol (COSOPT) 22.3-6.8 MG/ML ophthalmic solution Place 1 drop into both eyes 2 (two) times daily.     empagliflozin (JARDIANCE) 10 MG TABS tablet Take 1 tablet (10 mg total) by mouth daily before breakfast. 90 tablet 3   furosemide (LASIX) 40 MG tablet Take 1 tablet (40 mg total) by mouth daily. 90 tablet 3   levothyroxine (SYNTHROID) 75 MCG tablet TAKE 1 TABLET BY MOUTH AT 6 IN THE MORNING . APPOINTMENT REQUIRED FOR FUTURE REFILLS 90 tablet 0   Melatonin 10 MG TABS Take 20 mg by mouth at bedtime.     mexiletine (MEXITIL) 150 MG capsule TAKE 1 CAPSULE BY MOUTH TWICE  A DAY 60 capsule 11   potassium chloride (KLOR-CON) 10 MEQ tablet Take 1 tablet (10 mEq total) by mouth daily. 30 tablet 3   ROCKLATAN 0.02-0.005 % SOLN Place 1 drop into both eyes at bedtime.     rosuvastatin (CRESTOR) 5 MG tablet Take 5 mg by mouth daily. Take 1/2 everyday     sacubitril-valsartan (ENTRESTO) 49-51 MG Take 1 tablet by mouth 2 (two) times daily. 180 tablet 3   No current facility-administered medications on file prior to visit.    No Known Allergies  Past Medical History:  Diagnosis Date   ARTHRITIS 10/22/2008   BPH (benign prostatic hyperplasia)    CHF (congestive heart failure) (HCC)     sees Dr. Atreus Hasz Wilkerson    Dilated cardiomyopathy [facility])    DIVERTICULOSIS, COLON 12/08/2007   Glaucoma    sees Dr. Heather Wilkerson    H/O asbestos exposure    History of echocardiogram 05/22/2007   EF was 45-50% / Mild concentric LV hypertrophy with mild global hypokinesis and overall mild systolic dysfunction .  Mild AV sclerosis / Mild Mitral insufficiency / compared to prior study 04/24/02 -- LV function has improved further.     History of kidney stones    Hypercholesterolemia    Hypertension    Insomnia 06/11/2015   Radiation 01/01/15-02/18/15   base of tongue and bilateral neck 70 Gy   Skin cancer    squamous cell, basal cell   Squamous cell carcinoma 11/20/2014   base of tongue primary   Throat cancer (HCC) 10/2014   had chemo   Thyroid disease     Past Surgical History:  Procedure Laterality Date   CARDIAC CATHETERIZATION  10/17/2001   EF estimated at 30% / moderate LV enlargement  / 1. Minimal nonobstructive atherosclerotic coronary artery disease / 2. Severe LV dysfunction with global hypokinesia consistent with dilated nonischemic cardiomyopath / 3. Moderate pulmonary hypertension   COLONOSCOPY  06/23/2017   per Dr. Leone Wilkerson, clear, no repeats needed    CYSTOSCOPY N/A 08/26/2022   Procedure: CYSTOSCOPY;  Surgeon: Nicholas Purpura, MD;  Location: [facility];  Service: Urology;  Laterality: N/A;  90 MINUTES NEEDED FOR CASE  ANESTHESIA IS GENERAL OR SPINAL   ICD IMPLANT N/A 04/02/2021   Procedure: ICD IMPLANT;  Surgeon: Nicholas Lemming, MD;  Location: [facility] INVASIVE CV LAB;  Service: Cardiovascular;  Laterality: N/A;   LYMPH NODE BIOPSY     RIGHT/LEFT HEART CATH AND CORONARY ANGIOGRAPHY N/A 07/22/2020   Procedure: RIGHT/LEFT HEART CATH AND CORONARY ANGIOGRAPHY;  Surgeon: Wilkerson, Nicholas Eldridge M, MD;  [facility] LAB;  Service: Cardiovascular;  Laterality: N/A;   TRANSURETHRAL RESECTION OF PROSTATE N/A 08/26/2022   Procedure: TRANSURETHRAL RESECTION OF THE PROSTATE (TURP);   Surgeon: Nicholas Purpura, MD;  Location: [facility];  Service: Urology;  Laterality: N/A;    Social History   Tobacco Use  Smoking Status Never  Smokeless Tobacco Never    Social History   Substance and Sexual Activity  Alcohol Use Yes   Alcohol/week: 0.0 standard drinks of alcohol   Comment: occasional beer, a couple times a month    Family History  Problem Relation Age of Onset   Stroke Father    Cancer Father        kidney ca   Angina Mother    Colon cancer Neg Hx    Esophageal cancer Neg Hx    Rectal cancer Neg Hx    Stomach cancer Neg Hx  Review of Systems:  All other systems were reviewed and are negative.  Physical Exam: BP 114/70 (BP Location: Left Arm, Patient Position: Sitting, Cuff Size: Normal)   Pulse 61   Ht 5\' 9"  (1.753 m)   Wt 159 lb 12.8 oz (72.5 kg)   SpO2 98%   BMI 23.60 kg/m  GENERAL:  Well appearing WM in NAD HEENT:  PERRL, EOMI, sclera are clear. Oropharynx is clear. NECK:  No jugular venous distention, carotid upstroke brisk and symmetric, no bruits, no thyromegaly or adenopathy LUNGS:  Clear to auscultation bilaterally CHEST:  Unremarkable HEART:  RRR ,  PMI not displaced or sustained,S1 and S2 within normal limits, no S3, no S4: no clicks, no rubs, no murmurs ABD:  Soft, nontender. BS +, no masses or bruits. No hepatomegaly, no splenomegaly EXT:  2 + pulses throughout, no edema, no cyanosis no clubbing SKIN:  Warm and dry.  No rashes NEURO:  Alert and oriented x 3. Cranial nerves II through XII intact. PSYCH:  Cognitively intact   LABORATORY DATA:  Lab Results  Component Value Date   WBC 6.6 10/25/2022   HGB 15.0 10/25/2022   HCT 46.1 10/25/2022   PLT 235.0 10/25/2022   GLUCOSE 104 (H) 10/25/2022   CHOL 154 10/25/2022   TRIG 76.0 10/25/2022   HDL 48.10 10/25/2022   LDLCALC 91 10/25/2022   ALT 13 10/25/2022   AST 18 10/25/2022   NA 138 10/25/2022   K 4.3 10/25/2022   CL 102 10/25/2022   CREATININE 1.79 (H) 10/25/2022   BUN  34 (H) 10/25/2022   CO2 28 10/25/2022   TSH 0.97 10/25/2022   PSA 7.93 (H) 10/15/2019   INR 0.95 07/17/2015   HGBA1C 6.0 10/25/2022   Ecg not done today    Echo 11/02/16: Study Conclusions   - Left ventricle: The cavity size was mildly dilated. Wall   thickness was normal. Systolic function was moderately reduced.   The estimated ejection fraction was in the range of 35% to 40%.   Diffuse hypokinesis. Echogenic linear structure at the apex,   suggestive of calcified apical false tendon. Doppler parameters   are consistent with pseudonormal left ventricular relaxation   (grade 2 diastolic dysfunction). The E/e&' ratio is >15,   suggesting elevated LV filling pressure. - Mitral valve: Mildly thickened leaflets . There was mild   regurgitation. - Left atrium: The atrium was mildly dilated. - Inferior vena cava: The vessel was dilated. The respirophasic   diameter changes were blunted (< 50%), consistent with elevated   central venous pressure.   Impressions:   - Compared to a prior study in 2012, the LVEF is lower at 35-40%.   The LV is mildly dilated and globally hypokinetic. LV filling   pressure appears elevated with grade 2 DD.  Echo 07/14/20: 1. Left ventricular ejection fraction, by estimation, is 20 to 25%. The left ventricle has severely decreased function. The left ventricle demonstrates global hypokinesis. The left ventricular internal cavity size was mildly dilated. Left ventricular diastolic parameters are consistent with Grade III diastolic dysfunction (restrictive). Elevated left ventricular end-diastolic pressure. 2. Right ventricular systolic function is normal. The right ventricular size is normal. There is severely elevated pulmonary artery systolic pressure. 3. Left atrial size was severely dilated. 4. The mitral valve is normal in structure. Mild mitral valve regurgitation. No evidence of mitral stenosis. 5. The aortic valve is tricuspid. There is mild  calcification of the aortic valve. There is mild thickening of the aortic  valve. Aortic valve regurgitation is not visualized. No aortic stenosis is present. 6. The inferior vena cava is normal in size with <50% respiratory variability, suggesting right atrial pressure of 8 mmHg.  Cardiac cath 07/22/20:  RIGHT/LEFT HEART CATH AND CORONARY ANGIOGRAPHY  Conclusion    Prox LAD to Mid LAD lesion is 25% stenosed. Prox Cx to Dist Cx lesion is 25% stenosed. Prox RCA to Mid RCA lesion is 15% stenosed. LV end diastolic pressure is moderately elevated. Hemodynamic findings consistent with moderate pulmonary hypertension.   1. Nonobstructive CAD 2. Low cardiac output. Index 1.86 3. Moderately elevated LV filling pressures 4. Moderate Pulmonary HTN   Plan: optimize medical therapy for CHF. On Coreg. Just started [place]. Will add lasix 40 mg daily. Further titration of medication depending on initial response.    Event monitor 10/03/19: Study Highlights  Normal sinus rhythm Occasional runs of NSVT longest lasting 7 beats. Infrequent runs of SVT longest 9 beats. No significant bradycardia or pauses Average HR 71 bpm     Echo 01/05/21: IMPRESSIONS     1. Left ventricular ejection fraction, by estimation, is 20 to 25%. The  left ventricle has severely decreased function. The left ventricle  demonstrates global hypokinesis. The left ventricular internal cavity size  was mildly to moderately dilated. Left  ventricular diastolic parameters are consistent with Grade II diastolic  dysfunction (pseudonormalization). Elevated left atrial pressure.   2. Right ventricular systolic function is normal. The right ventricular  size is normal.   3. Left atrial size was mild to moderately dilated.   4. The mitral valve is normal in structure. Mild mitral valve  regurgitation.   5. The aortic valve is tricuspid. Aortic valve regurgitation is not  visualized. Mild aortic valve sclerosis is  present, with no evidence of  aortic valve stenosis.   6. The inferior vena cava is normal in size with greater than 50%  respiratory variability, suggesting right atrial pressure of 3 mmHg.   Comparison(s): The left ventricular function is unchanged.    Assessment / Plan: 1. Dilated cardiomyopathy with EF 35-40% in 2018.   Echo shows significant decline in LV function with EF 20-25%.  Symptoms well controlled now.  Now class 1-2. He is on optimal medical therapy.   S/p ICD implant followed by Dr Nicholas Wilkerson.   2. Hypertension well controlled.  3. PVCs/brief SVT/NSVT.  Continue carvedilol but at lower dose 25 mg bid. On Mexitil per EP  4. SSCA of the tongue. S/p RT and chemo.   5. CKD stage 3b. Followed by Nephrology.   Follow up in 6 months

## 2023-04-18 ENCOUNTER — Ambulatory Visit: Payer: PPO | Attending: Cardiology | Admitting: Cardiology

## 2023-04-18 ENCOUNTER — Encounter: Payer: Self-pay | Admitting: Cardiology

## 2023-04-18 VITALS — BP 114/70 | HR 61 | Ht 69.0 in | Wt 159.8 lb

## 2023-04-18 DIAGNOSIS — I4729 Other ventricular tachycardia: Secondary | ICD-10-CM

## 2023-04-18 DIAGNOSIS — N1832 Chronic kidney disease, stage 3b: Secondary | ICD-10-CM | POA: Diagnosis not present

## 2023-04-18 DIAGNOSIS — I493 Ventricular premature depolarization: Secondary | ICD-10-CM

## 2023-04-18 DIAGNOSIS — I420 Dilated cardiomyopathy: Secondary | ICD-10-CM | POA: Diagnosis not present

## 2023-04-18 DIAGNOSIS — Z95810 Presence of automatic (implantable) cardiac defibrillator: Secondary | ICD-10-CM | POA: Diagnosis not present

## 2023-04-18 DIAGNOSIS — I5022 Chronic systolic (congestive) heart failure: Secondary | ICD-10-CM | POA: Diagnosis not present

## 2023-04-18 NOTE — Patient Instructions (Signed)
Medication Instructions:  Continue same medications *If you need a refill on your cardiac medications before your next appointment, please call your pharmacy*   Lab Work: None ordered   Testing/Procedures: None ordered   Follow-Up: At [facility], you and your health needs are our priority.  As part of our continuing mission to provide you with exceptional heart care, we have created designated Provider Care Teams.  These Care Teams include your primary Cardiologist (physician) and Advanced Practice Providers (APPs -  Physician Assistants and Nurse Practitioners) who all work together to provide you with the care you need, when you need it.  We recommend signing up for the patient portal called "MyChart".  Sign up information is provided on this After Visit Summary.  MyChart is used to connect with patients for Virtual Visits (Telemedicine).  Patients are able to view lab/test results, encounter notes, upcoming appointments, etc.  Non-urgent messages can be sent to your provider as well.   To learn more about what you can do with MyChart, go to [email or web address].    Your next appointment:  6 months    Call in Sept to schedule Jan appointment     Provider:  Dr.Jordan   

## 2023-04-19 ENCOUNTER — Other Ambulatory Visit: Payer: Self-pay | Admitting: *Deleted

## 2023-04-19 DIAGNOSIS — Z79899 Other long term (current) drug therapy: Secondary | ICD-10-CM

## 2023-04-19 NOTE — Progress Notes (Signed)
Remote ICD transmission.   

## 2023-05-04 ENCOUNTER — Ambulatory Visit: Payer: PPO | Attending: Physician Assistant

## 2023-05-04 DIAGNOSIS — Z79899 Other long term (current) drug therapy: Secondary | ICD-10-CM | POA: Diagnosis not present

## 2023-05-09 ENCOUNTER — Other Ambulatory Visit: Payer: Self-pay | Admitting: Family Medicine

## 2023-05-11 DIAGNOSIS — N1832 Chronic kidney disease, stage 3b: Secondary | ICD-10-CM | POA: Diagnosis not present

## 2023-05-11 DIAGNOSIS — R351 Nocturia: Secondary | ICD-10-CM | POA: Diagnosis not present

## 2023-05-11 DIAGNOSIS — I129 Hypertensive chronic kidney disease with stage 1 through stage 4 chronic kidney disease, or unspecified chronic kidney disease: Secondary | ICD-10-CM | POA: Diagnosis not present

## 2023-05-11 DIAGNOSIS — I5042 Chronic combined systolic (congestive) and diastolic (congestive) heart failure: Secondary | ICD-10-CM | POA: Diagnosis not present

## 2023-05-11 DIAGNOSIS — N401 Enlarged prostate with lower urinary tract symptoms: Secondary | ICD-10-CM | POA: Diagnosis not present

## 2023-05-11 DIAGNOSIS — N1330 Unspecified hydronephrosis: Secondary | ICD-10-CM | POA: Diagnosis not present

## 2023-05-18 ENCOUNTER — Encounter: Payer: Self-pay | Admitting: Family Medicine

## 2023-05-18 ENCOUNTER — Ambulatory Visit: Payer: PPO | Admitting: Family Medicine

## 2023-05-18 VITALS — BP 118/78 | HR 63 | Temp 98.0°F | Wt 161.8 lb

## 2023-05-18 DIAGNOSIS — B958 Unspecified staphylococcus as the cause of diseases classified elsewhere: Secondary | ICD-10-CM | POA: Diagnosis not present

## 2023-05-18 MED ORDER — MUPIROCIN 2 % EX OINT: 1.0000 | TOPICAL_OINTMENT | Freq: Three times a day (TID) | CUTANEOUS | 0 refills | Status: DC

## 2023-05-18 NOTE — Progress Notes (Signed)
   Subjective:    Patient ID: IZIK CHHOEUN, male    DOB: 01-12-1941, 82 y.o.   MRN: [id number]  HPI Here for 4 weeks of burning and itching inside both nostrils. No bleeding. Neosporin has helped a little.    Review of Systems  Constitutional: Negative.   Respiratory: Negative.    Cardiovascular: Negative.        Objective:   Physical Exam Constitutional:      Appearance: Normal appearance.  HENT:     Right Ear: Tympanic membrane, ear canal and external ear normal.     Left Ear: Tympanic membrane, ear canal and external ear normal.     Nose: Nose normal.     Mouth/Throat:     Pharynx: Oropharynx is clear.  Eyes:     Conjunctiva/sclera: Conjunctivae normal.  Cardiovascular:     Rate and Rhythm: Normal rate and regular rhythm.     Pulses: Normal pulses.     Heart sounds: Normal heart sounds.  Pulmonary:     Effort: Pulmonary effort is normal.     Breath sounds: Normal breath sounds.  Lymphadenopathy:     Cervical: No cervical adenopathy.  Neurological:     Mental Status: He is alert.           Assessment & Plan:  Staph infection of the nostrils. He will apply Mupiricin ointment TID until clear.  Gershon Crane, MD

## 2023-05-25 ENCOUNTER — Telehealth: Payer: Self-pay

## 2023-05-25 NOTE — Telephone Encounter (Signed)
Pt called RN to let clinic staff know of a concern that was found in his mouth. Pt went to the dentist yesterday and they noted an area of concern they felt needed to be looked at. Pt would like to be seen by Dr. Basilio Cairo for evaluation. Rn will reach out to Dr. Basilio Cairo for feedback and call back the pt back with feedback.

## 2023-05-25 NOTE — Telephone Encounter (Signed)
RN called pt back to let him know that we would be following the pathology with his recent lesion found in his mouth. Rn let pt know if the pathology was concerning we would work him in as soon as possible to see [place]. He was grateful for this surveillance and telephone call.

## 2023-05-27 ENCOUNTER — Telehealth: Payer: Self-pay

## 2023-05-27 NOTE — Telephone Encounter (Signed)
Pt called Rn with update on area of concern in his mouth. Pt told RN that he went to [facility] to see specialist for evaluation. He told rn that the doctor stated the area looked better than the pictures that were provided. Pt also stated that the doctor did a thorough check of his neck, mouth, etc. Pt was happy to report that the doctor cleared him from this area of concern and told him it was nothing to worry about. The pt was very pleased and wanted Dr. Basilio Cairo to know. He also wanted Dr. Basilio Cairo to know how much he loves and appreciates her.

## 2023-06-02 DIAGNOSIS — H401112 Primary open-angle glaucoma, right eye, moderate stage: Secondary | ICD-10-CM | POA: Diagnosis not present

## 2023-06-22 ENCOUNTER — Other Ambulatory Visit: Payer: Self-pay | Admitting: Cardiology

## 2023-07-01 ENCOUNTER — Ambulatory Visit (INDEPENDENT_AMBULATORY_CARE_PROVIDER_SITE_OTHER): Payer: PPO

## 2023-07-01 DIAGNOSIS — I420 Dilated cardiomyopathy: Secondary | ICD-10-CM

## 2023-07-01 LAB — CUP PACEART REMOTE DEVICE CHECK
Battery Remaining Longevity: 100 mo
Battery Remaining Percentage: 79 %
Battery Voltage: 3.01 V
Brady Statistic RV Percent Paced: 1 %
Date Time Interrogation Session: 20240920020057
HighPow Impedance: 68 Ohm
Implantable Lead Connection Status: 753985
Implantable Lead Implant Date: 20220623
Implantable Lead Location: 753860
Implantable Lead Model: 7122
Implantable Pulse Generator Implant Date: 20220623
Lead Channel Impedance Value: 350 Ohm
Lead Channel Pacing Threshold Amplitude: 1.25 V
Lead Channel Pacing Threshold Pulse Width: 0.5 ms
Lead Channel Sensing Intrinsic Amplitude: 11.8 mV
Lead Channel Setting Pacing Amplitude: 2.5 V
Lead Channel Setting Pacing Pulse Width: 0.5 ms
Lead Channel Setting Sensing Sensitivity: 0.5 mV
Pulse Gen Serial Number: 810027079
Zone Setting Status: 755011

## 2023-07-03 ENCOUNTER — Other Ambulatory Visit: Payer: Self-pay | Admitting: Family Medicine

## 2023-07-05 ENCOUNTER — Other Ambulatory Visit: Payer: Self-pay

## 2023-07-05 ENCOUNTER — Ambulatory Visit: Payer: PPO

## 2023-07-05 NOTE — Progress Notes (Signed)
07/05/2023  Patient ID: Nicholas Wilkerson, male   DOB: [birth year], 81 y.o.   MRN: [id number]  Pharmacy Quality Measure Review  This patient is appearing on a report for being at risk of failing the adherence measure for diabetes medications this calendar year.   Medication: Jardiance 10mg  Last fill date: 01/28/2023 for 90 day supply  Insurance report was up to date. Patient has been skipping tablets to "get by." Providing Jardiance 10mg  samples and PAP process through [facility] has begun.  Sherrill Raring, PharmD Clinical Pharmacist [phone]

## 2023-07-07 DIAGNOSIS — H401112 Primary open-angle glaucoma, right eye, moderate stage: Secondary | ICD-10-CM | POA: Diagnosis not present

## 2023-07-11 NOTE — Progress Notes (Signed)
Remote ICD transmission.   

## 2023-07-18 ENCOUNTER — Ambulatory Visit: Payer: PPO

## 2023-07-18 DIAGNOSIS — I5023 Acute on chronic systolic (congestive) heart failure: Secondary | ICD-10-CM | POA: Diagnosis not present

## 2023-07-18 NOTE — Progress Notes (Signed)
07/18/2023 Name: Nicholas Wilkerson MRN: [id number] DOB: 1941-09-15  Chief Complaint  Patient presents with   Medication Management    Nicholas Wilkerson is a 82 y.o. year old male who was referred for medication management by their primary care provider, Nicholas Salisbury, MD. They presented for a face to face visit today.   They were referred to the pharmacist by their PCP for assistance in managing medication access    Subjective:  Care Team: Primary Care Provider: Nelwyn Salisbury, MD ; Next Scheduled Visit: 10/24/23  Medication Access/Adherence  Current Pharmacy:  [facility] [address]), [place] - [address] [place] ([place]) [place] [address] Phone: [phone] Fax: [phone]  [facility] - [place], [place] - [address] [place] [address] Phone: [phone] Fax: [phone]  [id number] - [place], [place] - [address] [place] [address] Phone: [phone] Fax: [phone]   Patient reports affordability concerns with their medications: Yes  Patient reports access/transportation concerns to their pharmacy: No  Patient reports adherence concerns with their medications:  No     Heart Failure:  Current medications:  ACEi/ARB/ARNI: Entresto 49-51mg  BID SGLT2i: Jardiance 10mg  once daily Beta blocker: Carvedilol 25mg  BID Mineralocorticoid Receptor Antagonist: None Diuretic regimen: Lasix 40mg  1 qod  Current home blood pressure readings: Did not bring log but checks every other day and reports readings at goal <140/90, all well within <130/80 Current home weights: 157-158 lbs daily  Patient denies volume overload signs or symptoms including shortness of breath, lower extremity edema, increased use of pillows at night   Current physical activity: Goes to the gym and stays busy working on projects at home (putting in new hardwood  floors)  Current medication access support: Entresto through [employer] PAP, Jardiance pending approval  Objective:  Lab Results  Component Value Date   HGBA1C 6.0 10/25/2022    Lab Results  Component Value Date   CREATININE 1.79 (H) 10/25/2022   BUN 34 (H) 10/25/2022   NA 138 10/25/2022   K 4.3 10/25/2022   CL 102 10/25/2022   CO2 28 10/25/2022    Lab Results  Component Value Date   CHOL 154 10/25/2022   HDL 48.10 10/25/2022   LDLCALC 91 10/25/2022   TRIG 76.0 10/25/2022   CHOLHDL 3 10/25/2022    Medications Reviewed Today     Reviewed by Nicholas Wilkerson, RPH (Pharmacist) on 07/18/23 at 1113  Med List Status: <None>   Medication Order Taking? Sig Documenting Provider Last Dose Status Informant  aspirin EC 81 MG tablet [id number] Yes Take 81 mg by mouth daily. Swallow whole. [provider] Taking Active   carvedilol (COREG) 25 MG tablet [id number] Yes TAKE 1 TABLET BY MOUTH TWICE DAILY WITH A MEAL [place], [name], MD Taking Active   dorzolamide-timolol (COSOPT) 22.3-6.8 MG/ML ophthalmic solution [id number] Yes Place 1 drop into both eyes 2 (two) times daily. [provider] Taking Active Self  empagliflozin (JARDIANCE) 10 MG TABS tablet [id number] Yes Take 1 tablet (10 mg total) by mouth daily before breakfast. Nicholas Salisbury, MD Taking Active   furosemide (LASIX) 40 MG tablet [id number]  Take 1 tablet by mouth once daily [place], [name], MD  Active            Med Note Nicholas Wilkerson Jul 18, 2023 11:13 AM) Leanora Ivanoff 1 tab every other day  levothyroxine (SYNTHROID) 75 MCG tablet [id number] Yes TAKE 1 TABLET BY MOUTH IN THE MORNING AT  6  AM  NEED  APPOINTMENT  FOR  REFILLS Nicholas Salisbury, MD Taking Active   Melatonin 10 MG TABS [id number] Yes Take 20 mg by mouth at bedtime. [provider] Taking Active Self  mexiletine (MEXITIL) 150 MG capsule [id number] Yes TAKE 1 CAPSULE BY MOUTH TWICE A DAY Camnitz, Will Daphine Deutscher, MD Taking Active   mupirocin  ointment (BACTROBAN) 2 % [id number]  Apply 1 Application topically 3 (three) times daily. Nicholas Salisbury, MD  Active   potassium chloride (KLOR-CON) 10 MEQ tablet [id number] Yes Take 1 tablet (10 mEq total) by mouth daily. Croitoru, Mihai, MD Taking Active Self  ROCKLATAN 0.02-0.005 % SOLN [id number]  Place 1 drop into both eyes at bedtime. [provider]  Active Self  rosuvastatin (CRESTOR) 5 MG tablet [id number] Yes TAKE 1 TABLET BY MOUTH EVERY OTHER DAY Nicholas Salisbury, MD Taking Active   sacubitril-valsartan [facility]) 49-51 MG [id number] Yes Take 1 tablet by mouth 2 (two) times daily. [place], [name], MD Taking Active               Assessment/Plan:   Heart Failure: - Currently appropriately managed - Reviewed appropriate blood pressure monitoring technique and reviewed goal blood pressure - Reviewed to weigh daily and when to contact cardiology with weight gain - Reviewed dietary modifications including limiting sodium intake - Recommend to continue current medications and check BP routinely at home  - Meets financial criteria for Jardiance patient assistance program through [facility]. Will collaborate with provider, CPhT, and patient to pursue assistance.    Follow Up Plan: 1 week on Jardiance outcome  Nicholas Wilkerson, PharmD Clinical Pharmacist [phone]

## 2023-07-26 ENCOUNTER — Ambulatory Visit (INDEPENDENT_AMBULATORY_CARE_PROVIDER_SITE_OTHER): Payer: PPO

## 2023-07-26 DIAGNOSIS — Z23 Encounter for immunization: Secondary | ICD-10-CM

## 2023-07-26 NOTE — Progress Notes (Signed)
Per orders of Dr. Clent Ridges , injection of Flu  given by Stann Ore. Patient tolerated injection well.

## 2023-08-03 ENCOUNTER — Ambulatory Visit (INDEPENDENT_AMBULATORY_CARE_PROVIDER_SITE_OTHER): Payer: PPO

## 2023-08-03 DIAGNOSIS — I5022 Chronic systolic (congestive) heart failure: Secondary | ICD-10-CM

## 2023-08-03 NOTE — Progress Notes (Signed)
08/03/2023  Patient ID: Nicholas Wilkerson, male   DOB: [birth year], 82 y.o.   MRN: [id number]  Patient presents in office today for assistance filling out Entresto PAP renewal application for 2025. Filled out and faxed to Dr. Elvis Coil office for signature on prescription.  Sherrill Raring, PharmD Clinical Pharmacist [phone]

## 2023-08-14 ENCOUNTER — Other Ambulatory Visit: Payer: Self-pay | Admitting: Cardiology

## 2023-08-23 ENCOUNTER — Telehealth: Payer: Self-pay | Admitting: Cardiology

## 2023-08-23 MED ORDER — ENTRESTO 49-51 MG PO TABS: 1.0000 | ORAL_TABLET | Freq: Two times a day (BID) | ORAL | 3 refills | Status: DC

## 2023-08-23 NOTE — Telephone Encounter (Signed)
Spoke to patient he stated normally Dr.Frye's Pharm D sends in patient assistance for [facility].Stated she is out of office this week.Stated [employer] patient assistance is requesting Dr.Jordan's complete name and address.He will bring letter that was mailed to him stating what is need along with proof of his income to office tomorrow.

## 2023-08-23 NOTE — Telephone Encounter (Signed)
Patient identification verified by 2 forms. Marilynn Rail, RN   Called and spoke to patient  Patient states:   -Received letter from [facility] stating form was incomplete   -form was missing some provider in formation   -will need to complete a new form  Informed patient message sent for assistance  Patient requesting a call back for update

## 2023-08-23 NOTE — Telephone Encounter (Signed)
Pt c/o medication issue:  1. Name of Medication:   sacubitril-valsartan (ENTRESTO) 49-51 MG  2. How are you currently taking this medication (dosage and times per day)?  As written 3. Are you having a reaction (difficulty breathing--STAT)? no  4. What is your medication issue? Need help filling out pt assistance

## 2023-08-24 ENCOUNTER — Other Ambulatory Visit: Payer: PPO

## 2023-08-24 NOTE — Telephone Encounter (Signed)
[employer] patient assistance for Sherryll Burger completed and faxed to fax # [phone].

## 2023-08-24 NOTE — Telephone Encounter (Signed)
Patient dropped off assistance forms In providers box

## 2023-09-03 ENCOUNTER — Other Ambulatory Visit: Payer: Self-pay | Admitting: Cardiology

## 2023-09-03 MED ORDER — ENTRESTO 49-51 MG PO TABS: 1.0000 | ORAL_TABLET | Freq: Two times a day (BID) | ORAL | 3 refills | Status: DC

## 2023-09-06 ENCOUNTER — Ambulatory Visit (INDEPENDENT_AMBULATORY_CARE_PROVIDER_SITE_OTHER): Payer: PPO | Admitting: Family Medicine

## 2023-09-06 ENCOUNTER — Encounter: Payer: Self-pay | Admitting: Family Medicine

## 2023-09-06 VITALS — BP 110/60 | HR 75 | Temp 98.0°F | Wt 159.0 lb

## 2023-09-06 DIAGNOSIS — M542 Cervicalgia: Secondary | ICD-10-CM

## 2023-09-06 DIAGNOSIS — G8929 Other chronic pain: Secondary | ICD-10-CM | POA: Insufficient documentation

## 2023-09-06 MED ORDER — METHOCARBAMOL 500 MG PO TABS: 500.0000 mg | ORAL_TABLET | Freq: Four times a day (QID) | ORAL | 2 refills | Status: DC | PRN

## 2023-09-06 NOTE — Progress Notes (Signed)
   Subjective:    Patient ID: Nicholas Wilkerson, male    DOB: 1940-12-08, 82 y.o.   MRN: [id number]  HPI Here for stiffness and pain in his neck for the past 3 days. No recent trauma. He has applied heat and an OTC "arthritis cream" , these have helped a little. He had Xrays of his cervical spine in January showing typical degenerative changes.    Review of Systems  Constitutional: Negative.   Respiratory: Negative.    Cardiovascular: Negative.   Musculoskeletal:  Positive for neck pain.       Objective:   Physical Exam Constitutional:      Appearance: Normal appearance. He is not ill-appearing.  Cardiovascular:     Rate and Rhythm: Normal rate and regular rhythm.     Pulses: Normal pulses.     Heart sounds: Normal heart sounds.  Pulmonary:     Effort: Pulmonary effort is normal.     Breath sounds: Normal breath sounds.  Musculoskeletal:     Comments: Posterior neck is mildly tender, ROM is somewhat limited   Neurological:     Mental Status: He is alert.           Assessment & Plan:  Neck pain. He can use Methocarbamol as needed. Recheck prn.  Gershon Crane, MD

## 2023-09-07 ENCOUNTER — Other Ambulatory Visit: Payer: PPO

## 2023-09-13 ENCOUNTER — Encounter: Payer: Self-pay | Admitting: Cardiology

## 2023-09-13 ENCOUNTER — Ambulatory Visit: Payer: PPO | Attending: Cardiology | Admitting: Cardiology

## 2023-09-13 VITALS — BP 110/64 | HR 64 | Ht 69.0 in | Wt 158.8 lb

## 2023-09-13 DIAGNOSIS — Z95810 Presence of automatic (implantable) cardiac defibrillator: Secondary | ICD-10-CM | POA: Diagnosis not present

## 2023-09-13 DIAGNOSIS — I493 Ventricular premature depolarization: Secondary | ICD-10-CM

## 2023-09-13 DIAGNOSIS — I420 Dilated cardiomyopathy: Secondary | ICD-10-CM | POA: Diagnosis not present

## 2023-09-13 DIAGNOSIS — I5022 Chronic systolic (congestive) heart failure: Secondary | ICD-10-CM

## 2023-09-13 DIAGNOSIS — I4729 Other ventricular tachycardia: Secondary | ICD-10-CM | POA: Diagnosis not present

## 2023-09-13 LAB — CUP PACEART INCLINIC DEVICE CHECK
Battery Remaining Longevity: 102 mo
Brady Statistic RV Percent Paced: 0.04 %
Date Time Interrogation Session: 20241203120727
HighPow Impedance: 69.75 Ohm
HighPow Impedance: 70 Ohm
Implantable Lead Connection Status: 753985
Implantable Lead Implant Date: 20220623
Implantable Lead Location: 753860
Implantable Lead Model: 7122
Implantable Pulse Generator Implant Date: 20220623
Lead Channel Impedance Value: 400 Ohm
Lead Channel Pacing Threshold Amplitude: 0.75 V
Lead Channel Pacing Threshold Amplitude: 0.75 V
Lead Channel Pacing Threshold Pulse Width: 0.5 ms
Lead Channel Pacing Threshold Pulse Width: 0.5 ms
Lead Channel Sensing Intrinsic Amplitude: 11.8 mV
Lead Channel Setting Pacing Amplitude: 2.5 V
Lead Channel Setting Pacing Pulse Width: 0.5 ms
Lead Channel Setting Sensing Sensitivity: 0.5 mV
Pulse Gen Serial Number: 810027079
Zone Setting Status: 755011

## 2023-09-13 NOTE — Progress Notes (Signed)
  Electrophysiology Office Note:   Date:  09/13/2023  ID:  Nicholas Wilkerson, DOB [birth year], MRN [id number]  Primary Cardiologist: Peter Swaziland, MD Electrophysiologist: Regan Lemming, MD      History of Present Illness:   Nicholas Wilkerson is a 82 y.o. 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   Since last being seen in our clinic the patient reports doing overall well.  He remains quite active.  His wife is recently had surgery on her foot.  He has been taking care of her.  He has been quite active doing this.  He has no acute complaints at this time.  he denies chest pain, palpitations, dyspnea, PND, orthopnea, nausea, vomiting, dizziness, syncope, edema, weight gain, or early satiety.   Review of systems complete and found to be negative unless listed in HPI.      EP Information / Studies Reviewed:    EKG is ordered today. Personal review as below.  EKG Interpretation Date/Time:  Tuesday September 13 2023 11:41:19 EST Ventricular Rate:  64 PR Interval:  208 QRS Duration:  96 QT Interval:  410 QTC Calculation: 422 R Axis:   52  Text Interpretation: Sinus rhythm with occasional Premature ventricular complexes Septal infarct (cited on or before 02-Apr-2021) When compared with ECG of 02-Apr-2021 16:16, Premature supraventricular complexes are no longer Present Nonspecific T wave abnormality, worse in Lateral leads Confirmed by Maclovio Henson ([id number]) on 09/13/2023 11:47:47 AM   ICD Interrogation-  reviewed in detail today,  See PACEART report.  Device History: Abbott Single Chamber ICD implanted 04/02/21 for CHF History of appropriate therapy: No History of AAD therapy: Yes; currently on Mexiletine    Risk Assessment/Calculations:              Physical Exam:   VS:  BP 110/64 (BP Location:  Right Arm, Patient Position: Sitting, Cuff Size: Normal)   Pulse 64   Ht 5\' 9"  (1.753 m)   Wt 158 lb 12.8 oz (72 kg)   SpO2 97%   BMI 23.45 kg/m    Wt Readings from Last 3 Encounters:  09/13/23 158 lb 12.8 oz (72 kg)  09/06/23 159 lb (72.1 kg)  05/18/23 161 lb 12.8 oz (73.4 kg)     GEN: Well nourished, well developed in no acute distress NECK: No JVD; No carotid bruits CARDIAC: Regular rate and rhythm, no murmurs, rubs, gallops RESPIRATORY:  Clear to auscultation without rales, wheezing or rhonchi  ABDOMEN: Soft, non-tender, non-distended EXTREMITIES:  No edema; No deformity   ASSESSMENT AND PLAN:    Chronic systolic dysfunction s/p Abbott single chamber ICD  euvolemic today Stable on an appropriate medical regimen Normal ICD function See Pace Art report No changes today  2.  Hypertension: Currently well-controlled  3.  PVCs/nonsustained VT: Currently on mexiletine.  Burden of 18%.  Continue current management.  Disposition:   Follow up with EP APP in 6 months   Signed, Venus Gilles Jorja Loa, MD

## 2023-09-28 DIAGNOSIS — M25562 Pain in left knee: Secondary | ICD-10-CM | POA: Diagnosis not present

## 2023-09-30 ENCOUNTER — Ambulatory Visit (INDEPENDENT_AMBULATORY_CARE_PROVIDER_SITE_OTHER): Payer: PPO

## 2023-09-30 DIAGNOSIS — I420 Dilated cardiomyopathy: Secondary | ICD-10-CM

## 2023-09-30 LAB — CUP PACEART REMOTE DEVICE CHECK
Battery Remaining Longevity: 97 mo
Battery Remaining Percentage: 77 %
Battery Voltage: 3.01 V
Brady Statistic RV Percent Paced: 1 %
Date Time Interrogation Session: 20241220010117
HighPow Impedance: 68 Ohm
Implantable Lead Connection Status: 753985
Implantable Lead Implant Date: 20220623
Implantable Lead Location: 753860
Implantable Lead Model: 7122
Implantable Pulse Generator Implant Date: 20220623
Lead Channel Impedance Value: 350 Ohm
Lead Channel Pacing Threshold Amplitude: 0.75 V
Lead Channel Pacing Threshold Pulse Width: 0.5 ms
Lead Channel Sensing Intrinsic Amplitude: 11.8 mV
Lead Channel Setting Pacing Amplitude: 2.5 V
Lead Channel Setting Pacing Pulse Width: 0.5 ms
Lead Channel Setting Sensing Sensitivity: 0.5 mV
Pulse Gen Serial Number: 810027079
Zone Setting Status: 755011

## 2023-10-02 ENCOUNTER — Other Ambulatory Visit: Payer: Self-pay | Admitting: Family Medicine

## 2023-10-22 NOTE — Progress Notes (Signed)
Nicholas Wilkerson Date of Birth: [birth year]   History of Present Illness: Nicholas Wilkerson is seen for followup. He has a history of dilated cardiomyopathy with initial ejection fraction of 30% over 18 years ago. Subsequent evaluation has demonstrated improvement to 50-55%. Last Echo was in January 2018 showing decrease in EF to 35-40%. ACEi was resumed at that time. . 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 He was treated with RT and chemo.   Last November he complained of some palpitations. Event monitor short short bursts of SVT and NSVT. No bradycardia or pauses. We increased his Coreg dose at that time.  He was seen in February and he noted he had extensive Urologic evaluation for an elevated PSA with no cancer found. He did complain of getting tired real easy with activity.  No edema. No chest pain. He does note some change in his breathing.  We updated an Echocardiogram which demonstrated significant decrease in LV function with  EF down to 20-25%. There was also evidence of significant pulmonary HTN.   We performed cardiac cath showing nonobstructive CAD. Moderately elevated LV filling pressures. Moderate pulmonary HTN.  He was started on Lasix 40 mg daily. Continued Coreg and Jardiance. Added Entresto. Now up to 49/51 mg bid. Added aldactone 25 mg daily. He is tolerating this well. He has noted that if he takes everything at once in the morning he gets real lightheaded. He states this is much better.   Still active walking and playing golf.   After optimization of medication Echo was repeated and was unchanged with EF 20-25%. He was seen by Nicholas Nicholas Wilkerson and ICD was implanted in June. Since then on remote monitoring he has 3 episodes of NSVT the longest lasting 6 seconds. He was unaware.   When seen in September 2023  he had increased SOB and PND. Pro BNP was > 6000. His lasix was increased back to 40 mg daily. Since then his symptoms have improved. No edema. No palpitations. He  has seen Nicholas Wilkerson with [facility] for stage 3b CKD.   He was seen by Nicholas. Elberta Wilkerson 11/08/22, fatigued with activities, no symptoms otherwise, recent monitor w/18% PVC burden and some VTs reaching his monitor zone, mexiletine was added. Had improved PVC burden on follow up.   On follow up today he is doing great. Stays very active doing home projects. No dizziness. No syncope, dyspnea, or chest pain. Was able to haul off 12 trailers of brush on his own this past week.      Current Outpatient Medications on File Prior to Visit  Medication Sig Dispense Refill   aspirin EC 81 MG tablet Take 81 mg by mouth daily. Swallow whole.     carvedilol (COREG) 25 MG tablet TAKE 1 TABLET BY MOUTH TWICE DAILY WITH A MEAL 180 tablet 2   dorzolamide-timolol (COSOPT) 22.3-6.8 MG/ML ophthalmic solution Place 1 drop into both eyes 2 (two) times daily.     empagliflozin (JARDIANCE) 10 MG TABS tablet Take 1 tablet (10 mg total) by mouth daily before breakfast. 90 tablet 3   furosemide (LASIX) 40 MG tablet Take 1 tablet by mouth once daily 90 tablet 3   levothyroxine (SYNTHROID) 75 MCG tablet Take 1 tablet (75 mcg total) by mouth daily before breakfast. 90 tablet 3   Melatonin 10 MG TABS Take 20 mg by mouth at bedtime.     mexiletine (MEXITIL) 150 MG capsule TAKE 1 CAPSULE BY MOUTH TWICE A  DAY 60 capsule 11   potassium chloride (KLOR-CON) 10 MEQ tablet Take 1 tablet (10 mEq total) by mouth daily. 30 tablet 3   rosuvastatin (CRESTOR) 5 MG tablet TAKE 1 TABLET BY MOUTH EVERY OTHER DAY 45 tablet 0   sacubitril-valsartan (ENTRESTO) 49-51 MG Take 1 tablet by mouth 2 (two) times daily. 180 tablet 3   sodium fluoride (FLUORISHIELD) 1.1 % GEL dental gel Place 1 Application onto teeth at bedtime. 120 mL 11   No current facility-administered medications on file prior to visit.    No Known Allergies  Past Medical History:  Diagnosis Date   ARTHRITIS 10/22/2008   BPH (benign prostatic hyperplasia)    CHF  (congestive heart failure) (HCC)    sees Nicholas. Jameica Couts Wilkerson    Dilated cardiomyopathy [facility])    DIVERTICULOSIS, COLON 12/08/2007   Glaucoma    sees Nicholas. Heather Wilkerson    H/O asbestos exposure    History of echocardiogram 05/22/2007   EF was 45-50% / Mild concentric LV hypertrophy with mild global hypokinesis and overall mild systolic dysfunction .  Mild AV sclerosis / Mild Mitral insufficiency / compared to prior study 04/24/02 -- LV function has improved further.     History of kidney stones    Hypercholesterolemia    Hypertension    Insomnia 06/11/2015   Radiation 01/01/15-02/18/15   base of tongue and bilateral neck 70 Gy   Skin cancer    squamous cell, basal cell   Squamous cell carcinoma 11/20/2014   base of tongue primary   Throat cancer (HCC) 10/2014   had chemo   Thyroid disease     Past Surgical History:  Procedure Laterality Date   CARDIAC CATHETERIZATION  10/17/2001   EF estimated at 30% / moderate LV enlargement  / 1. Minimal nonobstructive atherosclerotic coronary artery disease / 2. Severe LV dysfunction with global hypokinesia consistent with dilated nonischemic cardiomyopath / 3. Moderate pulmonary hypertension   COLONOSCOPY  06/23/2017   per Nicholas. Leone Wilkerson, clear, no repeats needed    CYSTOSCOPY N/A 08/26/2022   Procedure: CYSTOSCOPY;  Surgeon: Nicholas Purpura, MD;  Location: [facility];  Service: Urology;  Laterality: N/A;  90 MINUTES NEEDED FOR CASE  ANESTHESIA IS GENERAL OR SPINAL   ICD IMPLANT N/A 04/02/2021   Procedure: ICD IMPLANT;  Surgeon: Nicholas Lemming, MD;  Location: [facility] INVASIVE CV LAB;  Service: Cardiovascular;  Laterality: N/A;   LYMPH NODE BIOPSY     RIGHT/LEFT HEART CATH AND CORONARY ANGIOGRAPHY N/A 07/22/2020   Procedure: RIGHT/LEFT HEART CATH AND CORONARY ANGIOGRAPHY;  Surgeon: Wilkerson, Nicholas Kerper M, MD;  Location: [facility] INVASIVE CV LAB;  Service: Cardiovascular;  Laterality: N/A;   TRANSURETHRAL RESECTION OF PROSTATE N/A 08/26/2022   Procedure: TRANSURETHRAL  RESECTION OF THE PROSTATE (TURP);  Surgeon: Nicholas Purpura, MD;  Location: [facility];  Service: Urology;  Laterality: N/A;    Social History   Tobacco Use  Smoking Status Never  Smokeless Tobacco Never    Social History   Substance and Sexual Activity  Alcohol Use Yes   Alcohol/week: 0.0 standard drinks of alcohol   Comment: occasional beer, a couple times a month    Family History  Problem Relation Age of Onset   Stroke Father    Cancer Father        kidney ca   Angina Mother    Colon cancer Neg Hx    Esophageal cancer Neg Hx    Rectal cancer Neg Hx    Stomach cancer Neg Hx  Review of Systems:  All other systems were reviewed and are negative.  Physical Exam: BP (!) 125/55   Pulse (!) 56   Ht 5\' 9"  (1.753 Wilkerson)   Wt 157 lb (71.2 kg)   SpO2 98%   BMI 23.18 kg/Wilkerson  GENERAL:  Well appearing WM in NAD HEENT:  PERRL, EOMI, sclera are clear. Oropharynx is clear. NECK:  No jugular venous distention, carotid upstroke brisk and symmetric, no bruits, no thyromegaly or adenopathy LUNGS:  Clear to auscultation bilaterally CHEST:  Unremarkable HEART:  RRR ,  PMI not displaced or sustained,S1 and S2 within normal limits, no S3, no S4: no clicks, no rubs, no murmurs ABD:  Soft, nontender. BS +, no masses or bruits. No hepatomegaly, no splenomegaly EXT:  2 + pulses throughout, no edema, no cyanosis no clubbing SKIN:  Warm and dry.  No rashes NEURO:  Alert and oriented x 3. Cranial nerves II through XII intact. PSYCH:  Cognitively intact   LABORATORY DATA:  Lab Results  Component Value Date   WBC 7.3 10/24/2023   HGB 17.1 (H) 10/24/2023   HCT 52.6 (H) 10/24/2023   PLT 189.0 10/24/2023   GLUCOSE 97 10/24/2023   CHOL 148 10/24/2023   TRIG 99.0 10/24/2023   HDL 50.50 10/24/2023   LDLCALC 78 10/24/2023   ALT 16 10/24/2023   AST 17 10/24/2023   NA 140 10/24/2023   K 4.2 10/24/2023   CL 102 10/24/2023   CREATININE 1.72 (H) 10/24/2023   BUN 28 (H) 10/24/2023   CO2 27  10/24/2023   TSH 2.21 10/24/2023   PSA 7.93 (H) 10/15/2019   INR 0.95 07/17/2015   HGBA1C 6.3 10/24/2023   Ecg not done today    Echo 11/02/16: Study Conclusions   - Left ventricle: The cavity size was mildly dilated. Wall   thickness was normal. Systolic function was moderately reduced.   The estimated ejection fraction was in the range of 35% to 40%.   Diffuse hypokinesis. Echogenic linear structure at the apex,   suggestive of calcified apical false tendon. Doppler parameters   are consistent with pseudonormal left ventricular relaxation   (grade 2 diastolic dysfunction). The E/e&' ratio is >15,   suggesting elevated LV filling pressure. - Mitral valve: Mildly thickened leaflets . There was mild   regurgitation. - Left atrium: The atrium was mildly dilated. - Inferior vena cava: The vessel was dilated. The respirophasic   diameter changes were blunted (< 50%), consistent with elevated   central venous pressure.   Impressions:   - Compared to a prior study in 2012, the LVEF is lower at 35-40%.   The LV is mildly dilated and globally hypokinetic. LV filling   pressure appears elevated with grade 2 DD.  Echo 07/14/20: 1. Left ventricular ejection fraction, by estimation, is 20 to 25%. The left ventricle has severely decreased function. The left ventricle demonstrates global hypokinesis. The left ventricular internal cavity size was mildly dilated. Left ventricular diastolic parameters are consistent with Grade III diastolic dysfunction (restrictive). Elevated left ventricular end-diastolic pressure. 2. Right ventricular systolic function is normal. The right ventricular size is normal. There is severely elevated pulmonary artery systolic pressure. 3. Left atrial size was severely dilated. 4. The mitral valve is normal in structure. Mild mitral valve regurgitation. No evidence of mitral stenosis. 5. The aortic valve is tricuspid. There is mild calcification of the aortic valve.  There is mild thickening of the aortic valve. Aortic valve regurgitation is not visualized. No aortic  stenosis is present. 6. The inferior vena cava is normal in size with <50% respiratory variability, suggesting right atrial pressure of 8 mmHg.  Cardiac cath 07/22/20:  RIGHT/LEFT HEART CATH AND CORONARY ANGIOGRAPHY  Conclusion    Prox LAD to Mid LAD lesion is 25% stenosed. Prox Cx to Dist Cx lesion is 25% stenosed. Prox RCA to Mid RCA lesion is 15% stenosed. LV end diastolic pressure is moderately elevated. Hemodynamic findings consistent with moderate pulmonary hypertension.   1. Nonobstructive CAD 2. Low cardiac output. Index 1.86 3. Moderately elevated LV filling pressures 4. Moderate Pulmonary HTN   Plan: optimize medical therapy for CHF. On Coreg. Just started Boron. Will add lasix 40 mg daily. Further titration of medication depending on initial response.    Event monitor 10/03/19: Study Highlights  Normal sinus rhythm Occasional runs of NSVT longest lasting 7 beats. Infrequent runs of SVT longest 9 beats. No significant bradycardia or pauses Average HR 71 bpm     Echo 01/05/21: IMPRESSIONS     1. Left ventricular ejection fraction, by estimation, is 20 to 25%. The  left ventricle has severely decreased function. The left ventricle  demonstrates global hypokinesis. The left ventricular internal cavity size  was mildly to moderately dilated. Left  ventricular diastolic parameters are consistent with Grade II diastolic  dysfunction (pseudonormalization). Elevated left atrial pressure.   2. Right ventricular systolic function is normal. The right ventricular  size is normal.   3. Left atrial size was mild to moderately dilated.   4. The mitral valve is normal in structure. Mild mitral valve  regurgitation.   5. The aortic valve is tricuspid. Aortic valve regurgitation is not  visualized. Mild aortic valve sclerosis is present, with no evidence of  aortic  valve stenosis.   6. The inferior vena cava is normal in size with greater than 50%  respiratory variability, suggesting right atrial pressure of 3 mmHg.   Comparison(s): The left ventricular function is unchanged.    Assessment / Plan: 1. Dilated cardiomyopathy with EF 35-40% in 2018.   Echo shows significant decline in LV function with EF 20-25%.  Symptoms well controlled now.  Now class 1-2. He is on optimal medical therapy.   S/p ICD implant followed by Nicholas Nicholas Wilkerson.   2. Hypertension well controlled.  3. PVCs/brief SVT/NSVT.  Continue carvedilol but at lower dose 25 mg bid. On Mexitil per EP  4. SSCA of the tongue. S/p RT and chemo.   5. CKD stage 3b. Followed by Nephrology.    Follow up in 6 months

## 2023-10-24 ENCOUNTER — Telehealth: Payer: Self-pay

## 2023-10-24 ENCOUNTER — Ambulatory Visit: Payer: PPO

## 2023-10-24 ENCOUNTER — Encounter: Payer: Self-pay | Admitting: Family Medicine

## 2023-10-24 ENCOUNTER — Ambulatory Visit (INDEPENDENT_AMBULATORY_CARE_PROVIDER_SITE_OTHER): Payer: PPO | Admitting: Family Medicine

## 2023-10-24 VITALS — BP 118/60 | HR 54 | Temp 97.6°F | Ht 69.0 in | Wt 155.8 lb

## 2023-10-24 DIAGNOSIS — Z0000 Encounter for general adult medical examination without abnormal findings: Secondary | ICD-10-CM | POA: Diagnosis not present

## 2023-10-24 DIAGNOSIS — I420 Dilated cardiomyopathy: Secondary | ICD-10-CM

## 2023-10-24 DIAGNOSIS — E039 Hypothyroidism, unspecified: Secondary | ICD-10-CM

## 2023-10-24 LAB — CBC WITH DIFFERENTIAL/PLATELET
Basophils Absolute: 0.1 10*3/uL (ref 0.0–0.1)
Basophils Relative: 1.1 % (ref 0.0–3.0)
Eosinophils Absolute: 0.1 10*3/uL (ref 0.0–0.7)
Eosinophils Relative: 1.3 % (ref 0.0–5.0)
HCT: 52.6 % — ABNORMAL HIGH (ref 39.0–52.0)
Hemoglobin: 17.1 g/dL — ABNORMAL HIGH (ref 13.0–17.0)
Lymphocytes Relative: 13.5 % (ref 12.0–46.0)
Lymphs Abs: 1 10*3/uL (ref 0.7–4.0)
MCHC: 32.5 g/dL (ref 30.0–36.0)
MCV: 93.6 fL (ref 78.0–100.0)
Monocytes Absolute: 0.7 10*3/uL (ref 0.1–1.0)
Monocytes Relative: 10 % (ref 3.0–12.0)
Neutro Abs: 5.4 10*3/uL (ref 1.4–7.7)
Neutrophils Relative %: 74.1 % (ref 43.0–77.0)
Platelets: 189 10*3/uL (ref 150.0–400.0)
RBC: 5.62 Mil/uL (ref 4.22–5.81)
RDW: 14.8 % (ref 11.5–15.5)
WBC: 7.3 10*3/uL (ref 4.0–10.5)

## 2023-10-24 LAB — BASIC METABOLIC PANEL
BUN: 28 mg/dL — ABNORMAL HIGH (ref 6–23)
CO2: 27 meq/L (ref 19–32)
Calcium: 9.3 mg/dL (ref 8.4–10.5)
Chloride: 102 meq/L (ref 96–112)
Creatinine, Ser: 1.72 mg/dL — ABNORMAL HIGH (ref 0.40–1.50)
GFR: 37.18 mL/min — ABNORMAL LOW (ref >60.00)
Glucose, Bld: 97 mg/dL (ref 70–99)
Potassium: 4.2 meq/L (ref 3.5–5.1)
Sodium: 140 meq/L (ref 135–145)

## 2023-10-24 LAB — HEPATIC FUNCTION PANEL
ALT: 16 U/L (ref 0–53)
AST: 17 U/L (ref 0–37)
Albumin: 4.4 g/dL (ref 3.5–5.2)
Alkaline Phosphatase: 80 U/L (ref 39–117)
Bilirubin, Direct: 0.1 mg/dL (ref 0.0–0.3)
Total Bilirubin: 0.6 mg/dL (ref 0.2–1.2)
Total Protein: 6.5 g/dL (ref 6.0–8.3)

## 2023-10-24 LAB — LIPID PANEL
Cholesterol: 148 mg/dL (ref 0–200)
HDL: 50.5 mg/dL (ref >39.00)
LDL Cholesterol: 78 mg/dL (ref 0–99)
NonHDL: 97.93
Total CHOL/HDL Ratio: 3
Triglycerides: 99 mg/dL (ref 0.0–149.0)
VLDL: 19.8 mg/dL (ref 0.0–40.0)

## 2023-10-24 LAB — T4, FREE: Free T4: 1.03 ng/dL (ref 0.60–1.60)

## 2023-10-24 LAB — HEMOGLOBIN A1C: Hgb A1c MFr Bld: 6.3 % (ref 4.6–6.5)

## 2023-10-24 LAB — T3, FREE: T3, Free: 3 pg/mL (ref 2.3–4.2)

## 2023-10-24 LAB — TSH: TSH: 2.21 u[IU]/mL (ref 0.35–5.50)

## 2023-10-24 MED ORDER — LEVOTHYROXINE SODIUM 75 MCG PO TABS: 75.0000 ug | ORAL_TABLET | Freq: Every day | ORAL | 3 refills | Status: DC

## 2023-10-24 MED ORDER — SODIUM FLUORIDE 1.1 % DT GEL
1.0000 | Freq: Every day | DENTAL | 11 refills | Status: DC
  Filled 2023-11-30: qty 112, 112d supply, fill #0
  Filled 2024-03-29: qty 112, 112d supply, fill #1
  Filled 2024-08-29: qty 112, 60d supply, fill #2

## 2023-10-24 NOTE — Telephone Encounter (Signed)
 Copied from CRM [id number]. Topic: General - Other >> Oct 21, 2023  3:41 PM [name] wrote: Reason for CRM: Patient stated that he recieved something in the mail from [facility] regarding medicaid. Patient wants to let pharmacist know that he is going to bring form for her to look at.

## 2023-10-24 NOTE — Progress Notes (Signed)
   10/24/2023  Patient ID: Nicholas Wilkerson, male   DOB: 05/05/1941, 83 y.o.   MRN: [id number]  Patient presented in office with concerns regarding his Entresto  PAP application for 2025 renewal through [employer]. Application pending and cannot continue processing until company receives proof of denial from [employer] application that has been submitted.  Applied for Entresto  assistance while in office today through the [employer] through their cardiomyopathy fund. Patient automatically approved through 09/22/24.  Card info listed below for reference:   [facility] pharmacy will need a new rx from Dr. Gib office in order to proceed. Made note as pt has f/u next week with cardio.  Jon VEAR Lindau, PharmD Clinical Pharmacist [phone]

## 2023-10-24 NOTE — Telephone Encounter (Signed)
 Pt  came for his CPE this morning and gave the paperwork Marylene Land the pharmacist

## 2023-10-24 NOTE — Progress Notes (Signed)
 Subjective:    Patient ID: Nicholas Wilkerson, male    DOB: 06-Feb-1941, 83 y.o.   MRN: [id number]  HPI Here for a well exam. He is doing well. He saw Dr. Inocencio on 09-13-23, and he was pleased with his cardiac status. He stays active. He sees Dr. Renda regularly for prostate exams and PSA checks.    Review of Systems  Constitutional: Negative.   HENT: Negative.    Eyes: Negative.   Respiratory: Negative.    Cardiovascular: Negative.   Gastrointestinal: Negative.   Genitourinary: Negative.   Musculoskeletal: Negative.   Skin: Negative.   Neurological: Negative.   Psychiatric/Behavioral: Negative.         Objective:   Physical Exam Constitutional:      General: He is not in acute distress.    Appearance: Normal appearance. He is well-developed. He is not diaphoretic.  HENT:     Head: Normocephalic and atraumatic.     Right Ear: External ear normal.     Left Ear: External ear normal.     Nose: Nose normal.     Mouth/Throat:     Pharynx: No oropharyngeal exudate.  Eyes:     General: No scleral icterus.       Right eye: No discharge.        Left eye: No discharge.     Conjunctiva/sclera: Conjunctivae normal.     Pupils: Pupils are equal, round, and reactive to light.  Neck:     Thyroid : No thyromegaly.     Vascular: No JVD.     Trachea: No tracheal deviation.  Cardiovascular:     Rate and Rhythm: Normal rate and regular rhythm.     Pulses: Normal pulses.     Heart sounds: Normal heart sounds. No murmur heard.    No friction rub. No gallop.     Comments: Frequent ectopy  Pulmonary:     Effort: Pulmonary effort is normal. No respiratory distress.     Breath sounds: Normal breath sounds. No wheezing or rales.  Chest:     Chest wall: No tenderness.  Abdominal:     General: Bowel sounds are normal. There is no distension.     Palpations: Abdomen is soft. There is no mass.     Tenderness: There is no abdominal tenderness. There is no guarding or rebound.   Genitourinary:    Penis: No tenderness.   Musculoskeletal:        General: No tenderness. Normal range of motion.     Cervical back: Neck supple.  Lymphadenopathy:     Cervical: No cervical adenopathy.  Skin:    General: Skin is warm and dry.     Coloration: Skin is not pale.     Findings: No erythema or rash.  Neurological:     General: No focal deficit present.     Mental Status: He is alert and oriented to person, place, and time.     Cranial Nerves: No cranial nerve deficit.     Motor: No abnormal muscle tone.     Coordination: Coordination normal.     Deep Tendon Reflexes: Reflexes are normal and symmetric. Reflexes normal.  Psychiatric:        Mood and Affect: Mood normal.        Behavior: Behavior normal.        Thought Content: Thought content normal.        Judgment: Judgment normal.           Assessment &  Plan:  Well exam. We discussed diet and exercise. Get fasting labs. Garnette Olmsted, MD

## 2023-10-25 ENCOUNTER — Encounter: Payer: Self-pay | Admitting: Family Medicine

## 2023-10-25 DIAGNOSIS — N183 Chronic kidney disease, stage 3 unspecified: Secondary | ICD-10-CM | POA: Insufficient documentation

## 2023-10-26 NOTE — Telephone Encounter (Signed)
Copied from CRM [id number]. Topic: Clinical - Prescription Issue >> Oct 26, 2023 12:08 PM Isabell A wrote: Reason for CRM: Patient states sodium fluoride (FLUORISHIELD) 1.1 % GEL dental gel was sent to the wrong pharmacy.   Correct pharmacy:   [facility] [address] [place], [place], [place] [address] [phone]

## 2023-10-31 ENCOUNTER — Ambulatory Visit: Payer: PPO | Attending: Cardiology | Admitting: Cardiology

## 2023-10-31 ENCOUNTER — Encounter: Payer: Self-pay | Admitting: Cardiology

## 2023-10-31 VITALS — BP 125/55 | HR 56 | Ht 69.0 in | Wt 157.0 lb

## 2023-10-31 DIAGNOSIS — I493 Ventricular premature depolarization: Secondary | ICD-10-CM

## 2023-10-31 DIAGNOSIS — N1832 Chronic kidney disease, stage 3b: Secondary | ICD-10-CM | POA: Diagnosis not present

## 2023-10-31 DIAGNOSIS — I5022 Chronic systolic (congestive) heart failure: Secondary | ICD-10-CM

## 2023-10-31 DIAGNOSIS — I420 Dilated cardiomyopathy: Secondary | ICD-10-CM

## 2023-10-31 DIAGNOSIS — Z95810 Presence of automatic (implantable) cardiac defibrillator: Secondary | ICD-10-CM | POA: Diagnosis not present

## 2023-10-31 DIAGNOSIS — H401112 Primary open-angle glaucoma, right eye, moderate stage: Secondary | ICD-10-CM | POA: Diagnosis not present

## 2023-10-31 NOTE — Patient Instructions (Signed)
Medication Instructions:  Continue same medications *If you need a refill on your cardiac medications before your next appointment, please call your pharmacy*   Lab Work: None ordered   Testing/Procedures: None ordered   Follow-Up: At [facility], you and your health needs are our priority.  As part of our continuing mission to provide you with exceptional heart care, we have created designated Provider Care Teams.  These Care Teams include your primary Cardiologist (physician) and Advanced Practice Providers (APPs -  Physician Assistants and Nurse Practitioners) who all work together to provide you with the care you need, when you need it.  We recommend signing up for the patient portal called "MyChart".  Sign up information is provided on this After Visit Summary.  MyChart is used to connect with patients for Virtual Visits (Telemedicine).  Patients are able to view lab/test results, encounter notes, upcoming appointments, etc.  Non-urgent messages can be sent to your provider as well.   To learn more about what you can do with MyChart, go to [email or web address].    Your next appointment:  6 months   Call in April to schedule July appointment    ( New Office )    Provider:  Dr.Jordan

## 2023-11-01 NOTE — Progress Notes (Signed)
Remote ICD transmission.   

## 2023-11-02 ENCOUNTER — Telehealth: Payer: Self-pay | Admitting: Cardiology

## 2023-11-02 MED ORDER — ENTRESTO 49-51 MG PO TABS: 1.0000 | ORAL_TABLET | Freq: Two times a day (BID) | ORAL | 3 refills | Status: DC

## 2023-11-02 NOTE — Telephone Encounter (Signed)
Pt c/o medication issue:  1. Name of Medication:   sacubitril-valsartan (ENTRESTO) 49-51 MG    2. How are you currently taking this medication (dosage and times per day)? As written  3. Are you having a reaction (difficulty breathing--STAT)? No   4. What is your medication issue? [employer] is not covering anymore.

## 2023-11-02 NOTE — Telephone Encounter (Signed)
Called and spoke to patient. Verified name and DOB. Patient is calling to have his Entresto 49/51 mg transferred to [facility] on [address]. Patient report [employer] will no longer cover his Entresto. He was able to reach out to his PCP office and they got him a grant to hepl with covering Entresto. Prescription sent to [facility].

## 2023-11-07 ENCOUNTER — Encounter: Payer: Self-pay | Admitting: Family Medicine

## 2023-11-21 DIAGNOSIS — N1832 Chronic kidney disease, stage 3b: Secondary | ICD-10-CM | POA: Diagnosis not present

## 2023-11-28 ENCOUNTER — Other Ambulatory Visit: Payer: Self-pay | Admitting: Cardiology

## 2023-11-28 ENCOUNTER — Other Ambulatory Visit (HOSPITAL_COMMUNITY): Payer: Self-pay

## 2023-11-28 DIAGNOSIS — M25562 Pain in left knee: Secondary | ICD-10-CM | POA: Diagnosis not present

## 2023-11-28 MED ORDER — SACUBITRIL-VALSARTAN 49-51 MG PO TABS: 1.0000 | ORAL_TABLET | Freq: Two times a day (BID) | ORAL | 3 refills | Status: DC

## 2023-11-30 ENCOUNTER — Other Ambulatory Visit (HOSPITAL_COMMUNITY): Payer: Self-pay

## 2023-11-30 DIAGNOSIS — N1832 Chronic kidney disease, stage 3b: Secondary | ICD-10-CM | POA: Diagnosis not present

## 2023-11-30 DIAGNOSIS — N401 Enlarged prostate with lower urinary tract symptoms: Secondary | ICD-10-CM | POA: Diagnosis not present

## 2023-11-30 DIAGNOSIS — I5042 Chronic combined systolic (congestive) and diastolic (congestive) heart failure: Secondary | ICD-10-CM | POA: Diagnosis not present

## 2023-11-30 DIAGNOSIS — R351 Nocturia: Secondary | ICD-10-CM | POA: Diagnosis not present

## 2023-11-30 DIAGNOSIS — I129 Hypertensive chronic kidney disease with stage 1 through stage 4 chronic kidney disease, or unspecified chronic kidney disease: Secondary | ICD-10-CM | POA: Diagnosis not present

## 2023-11-30 DIAGNOSIS — N1330 Unspecified hydronephrosis: Secondary | ICD-10-CM | POA: Diagnosis not present

## 2023-12-30 ENCOUNTER — Ambulatory Visit (INDEPENDENT_AMBULATORY_CARE_PROVIDER_SITE_OTHER): Payer: PPO

## 2023-12-30 DIAGNOSIS — I420 Dilated cardiomyopathy: Secondary | ICD-10-CM | POA: Diagnosis not present

## 2023-12-30 LAB — CUP PACEART REMOTE DEVICE CHECK
Battery Remaining Longevity: 95 mo
Battery Remaining Percentage: 76 %
Battery Voltage: 3.01 V
Brady Statistic RV Percent Paced: 1 %
Date Time Interrogation Session: 20250321030024
HighPow Impedance: 70 Ohm
Implantable Lead Connection Status: 753985
Implantable Lead Implant Date: 20220623
Implantable Lead Location: 753860
Implantable Lead Model: 7122
Implantable Pulse Generator Implant Date: 20220623
Lead Channel Impedance Value: 350 Ohm
Lead Channel Pacing Threshold Amplitude: 0.75 V
Lead Channel Pacing Threshold Pulse Width: 0.5 ms
Lead Channel Sensing Intrinsic Amplitude: 11.8 mV
Lead Channel Setting Pacing Amplitude: 2.5 V
Lead Channel Setting Pacing Pulse Width: 0.5 ms
Lead Channel Setting Sensing Sensitivity: 0.5 mV
Pulse Gen Serial Number: 810027079
Zone Setting Status: 755011

## 2024-01-04 DIAGNOSIS — D045 Carcinoma in situ of skin of trunk: Secondary | ICD-10-CM | POA: Diagnosis not present

## 2024-01-04 DIAGNOSIS — L814 Other melanin hyperpigmentation: Secondary | ICD-10-CM | POA: Diagnosis not present

## 2024-01-04 DIAGNOSIS — D692 Other nonthrombocytopenic purpura: Secondary | ICD-10-CM | POA: Diagnosis not present

## 2024-01-04 DIAGNOSIS — B078 Other viral warts: Secondary | ICD-10-CM | POA: Diagnosis not present

## 2024-01-04 DIAGNOSIS — L570 Actinic keratosis: Secondary | ICD-10-CM | POA: Diagnosis not present

## 2024-01-04 DIAGNOSIS — D0439 Carcinoma in situ of skin of other parts of face: Secondary | ICD-10-CM | POA: Diagnosis not present

## 2024-01-04 DIAGNOSIS — L821 Other seborrheic keratosis: Secondary | ICD-10-CM | POA: Diagnosis not present

## 2024-01-04 DIAGNOSIS — L080 Pyoderma: Secondary | ICD-10-CM | POA: Diagnosis not present

## 2024-01-04 DIAGNOSIS — C44319 Basal cell carcinoma of skin of other parts of face: Secondary | ICD-10-CM | POA: Diagnosis not present

## 2024-01-04 DIAGNOSIS — D485 Neoplasm of uncertain behavior of skin: Secondary | ICD-10-CM | POA: Diagnosis not present

## 2024-01-04 DIAGNOSIS — Z85828 Personal history of other malignant neoplasm of skin: Secondary | ICD-10-CM | POA: Diagnosis not present

## 2024-01-08 ENCOUNTER — Other Ambulatory Visit: Payer: Self-pay | Admitting: Family Medicine

## 2024-01-26 DIAGNOSIS — R972 Elevated prostate specific antigen [PSA]: Secondary | ICD-10-CM | POA: Diagnosis not present

## 2024-02-01 DIAGNOSIS — C44319 Basal cell carcinoma of skin of other parts of face: Secondary | ICD-10-CM | POA: Diagnosis not present

## 2024-02-01 DIAGNOSIS — Z85828 Personal history of other malignant neoplasm of skin: Secondary | ICD-10-CM | POA: Diagnosis not present

## 2024-02-03 DIAGNOSIS — R351 Nocturia: Secondary | ICD-10-CM | POA: Diagnosis not present

## 2024-02-03 DIAGNOSIS — R972 Elevated prostate specific antigen [PSA]: Secondary | ICD-10-CM | POA: Diagnosis not present

## 2024-02-03 DIAGNOSIS — N401 Enlarged prostate with lower urinary tract symptoms: Secondary | ICD-10-CM | POA: Diagnosis not present

## 2024-02-03 DIAGNOSIS — N402 Nodular prostate without lower urinary tract symptoms: Secondary | ICD-10-CM | POA: Diagnosis not present

## 2024-02-06 ENCOUNTER — Other Ambulatory Visit: Payer: Self-pay | Admitting: Cardiology

## 2024-02-06 DIAGNOSIS — H401112 Primary open-angle glaucoma, right eye, moderate stage: Secondary | ICD-10-CM | POA: Diagnosis not present

## 2024-02-07 NOTE — Progress Notes (Signed)
 Remote ICD transmission.

## 2024-02-28 ENCOUNTER — Ambulatory Visit (INDEPENDENT_AMBULATORY_CARE_PROVIDER_SITE_OTHER): Admitting: Family Medicine

## 2024-02-28 DIAGNOSIS — Z0000 Encounter for general adult medical examination without abnormal findings: Secondary | ICD-10-CM

## 2024-02-28 NOTE — Progress Notes (Signed)
 PATIENT CHECK-IN and HEALTH RISK ASSESSMENT QUESTIONNAIRE:  -completed by phone/video for upcoming Medicare Preventive Visit  Pre-Visit Check-in: 1)Vitals (height, wt, BP, etc) - record in vitals section for visit on day of visit Request home vitals (wt, BP, etc.) and enter into vitals, THEN update Vital Signs SmartPhrase below at the top of the HPI. See below.  2)Review and Update Medications, Allergies PMH, Surgeries, Social history in Epic 3)Hospitalizations in the last year with date/reason? No   4)Review and Update Care Team (patient's specialists) in Epic 5) Complete PHQ9 in Epic  6) Complete Fall Screening in Epic 7)Review all Health Maintenance Due and order under PCP if not done.  Medicare Wellness Patient Questionnaire:  Answer theses question about your habits: How often do you have a drink containing alcohol?No How many drinks containing alcohol do you have on a typical day when you are drinking?Na How often do you have six or more drinks on one occasion?Na Have you ever smoked?no How many packs a day do/did you smoke? NA Do you use smokeless tobacco?no Do you use an illicit drugs?no On average, how many days per week do you engage in moderate to strenuous exercise (like a brisk walk)?grandkids, yardwork, etc., housecleaning, reports never sits On average, how many minutes do you engage in exercise at this level? Cooks at home, avoids processed foods Typical breakfast:Cereal and toast  Typical lunch:salad and soup  Typical dinner:Chicken pie green beans and soup  Typical snacks:Nuts  Beverages: Coffee, water    Answer theses question about your everyday activities: Can you perform most household chores?Yes  Are you deaf or have significant trouble hearing?NO  Do you feel that you have a problem with memory?No  Do you feel safe at home?Yes  Last dentist visit? 6 weeks ago  8. Do you have any difficulty performing your everyday activities?No  Are you having any  difficulty walking, taking medications on your own, and or difficulty managing daily home needs?NO  Do you have difficulty walking or climbing stairs?No  Do you have difficulty dressing or bathing?no Do you have difficulty doing errands alone such as visiting a doctor's office or shopping?No  Do you currently have any difficulty preparing food and eating?NO  Do you currently have any difficulty using the toilet?No  Do you have any difficulty managing your finances?NO  Do you have any difficulties with housekeeping of managing your housekeeping?NO    Do you have Advanced Directives in place (Living Will, Healthcare Power or Attorney)? Yes    [facility] and [facility]   Do you currently use prescribed or non-prescribed narcotic or opioid pain medications?n  Do you have a history or close family history of breast, ovarian, tubal or peritoneal cancer or a family member with BRCA (breast cancer susceptibility 1 and 2) gene mutations?No    Nurse/Assistant Credentials/time stamp:Leah A.Wright CMA 3:33 pm    ----------------------------------------------------------------------------------------------------------------------------------------------------------------------------------------------------------------------  Because this visit was a virtual/telehealth visit, some criteria may be missing or patient reported. Any vitals not documented were not able to be obtained and vitals that have been documented are patient reported.    MEDICARE ANNUAL PREVENTIVE CARE VISIT WITH PROVIDER (Welcome to Medicare, initial annual wellness or annual wellness exam)  Virtual Visit via Phone Note  I connected with Nicholas Wilkerson on 02/28/24  by phone and verified that I am speaking with the correct person using two identifiers. Prefers phone visit.  Location patient: home Location provider:work or home office Persons participating in the virtual visit: patient,  provider  Concerns  and/or follow up today:  no new concerns. Reports feels good.    See HM section in Epic for other details of completed HM.    ROS: negative for report of fevers, unintentional weight loss, vision changes, vision loss, hearing loss or change, chest pain, sob, hemoptysis, melena, hematochezia, hematuria, falls, bleeding or bruising, thoughts of suicide or self harm, memory loss  Patient-completed extensive health risk assessment - reviewed and discussed with the patient: See Health Risk Assessment completed with patient prior to the visit either above or in recent phone note. This was reviewed in detailed with the patient today and appropriate recommendations, orders and referrals were placed as needed per Summary below and patient instructions.   Review of Medical History: -PMH, PSH, Family History and current specialty and care providers reviewed and updated and listed below   Patient Care Team: Donley Furth, MD as PCP - General (Family Medicine) Lei Pump, MD as PCP - Electrophysiology (Cardiology) [place], [name], MD as PCP - Cardiology (Cardiology) Almeda Jacobs, MD as Consulting Physician (Hematology and Oncology) Vernadine Golas, MD as Consulting Physician (Otolaryngology) Colie Dawes, MD as Attending Physician (Radiation Oncology) Alanna Alley, NP (Inactive) as Nurse Practitioner (Nurse Practitioner) [place], [name], MD as Consulting Physician (Cardiology) [place], Heather, OD (Optometry) Carnell Christian, [facility] (Pharmacist)   Past Medical History:  Diagnosis Date   ARTHRITIS 10/22/2008   BPH (benign prostatic hyperplasia)    CHF (congestive heart failure) [facility])    sees Dr. Peter [place]    Dilated cardiomyopathy [facility])    DIVERTICULOSIS, COLON 12/08/2007   Glaucoma    sees Dr. Heather [place]    H/O asbestos exposure    History of echocardiogram 05/22/2007   EF was 45-50% / Mild concentric LV hypertrophy with mild global hypokinesis and overall mild systolic  dysfunction .  Mild AV sclerosis / Mild Mitral insufficiency / compared to prior study 04/24/02 -- LV function has improved further.     History of kidney stones    Hypercholesterolemia    Hypertension    Insomnia 06/11/2015   Radiation 01/01/15-02/18/15   base of tongue and bilateral neck 70 Gy   Skin cancer    squamous cell, basal cell   Squamous cell carcinoma 11/20/2014   base of tongue primary   Throat cancer (HCC) 10/2014   had chemo   Thyroid  disease     Past Surgical History:  Procedure Laterality Date   CARDIAC CATHETERIZATION  10/17/2001   EF estimated at 30% / moderate LV enlargement  / 1. Minimal nonobstructive atherosclerotic coronary artery disease / 2. Severe LV dysfunction with global hypokinesia consistent with dilated nonischemic cardiomyopath / 3. Moderate pulmonary hypertension   COLONOSCOPY  06/23/2017   per Dr. Willy Harvest, clear, no repeats needed    CYSTOSCOPY N/A 08/26/2022   Procedure: CYSTOSCOPY;  Surgeon: Florencio Hunting, MD;  Location: [facility];  Service: Urology;  Laterality: N/A;  90 MINUTES NEEDED FOR CASE  ANESTHESIA IS GENERAL OR SPINAL   ICD IMPLANT N/A 04/02/2021   Procedure: ICD IMPLANT;  Surgeon: Lei Pump, MD;  Location: [facility] INVASIVE CV LAB;  Service: Cardiovascular;  Laterality: N/A;   LYMPH NODE BIOPSY     RIGHT/LEFT HEART CATH AND CORONARY ANGIOGRAPHY N/A 07/22/2020   Procedure: RIGHT/LEFT HEART CATH AND CORONARY ANGIOGRAPHY;  Surgeon: [place], [name], MD;  Location: [facility] INVASIVE CV LAB;  Service: Cardiovascular;  Laterality: N/A;   TRANSURETHRAL RESECTION OF PROSTATE N/A 08/26/2022   Procedure:  TRANSURETHRAL RESECTION OF THE PROSTATE (TURP);  Surgeon: Florencio Hunting, MD;  Location: [facility];  Service: Urology;  Laterality: N/A;    Social History   Socioeconomic History   Marital status: Married    Spouse name: Not on file   Number of children: 4   Years of education: Not on file   Highest education level: 12th grade  Occupational  History   Occupation: Development worker, community: RETIRED    Comment: retired  Tobacco Use   Smoking status: Never   Smokeless tobacco: Never  Vaping Use   Vaping status: Never Used  Substance and Sexual Activity   Alcohol use: Yes    Alcohol/week: 0.0 standard drinks of alcohol    Comment: occasional beer, a couple times a month   Drug use: No   Sexual activity: Not Currently    Comment: retired Nurse, learning disability, married  Other Topics Concern   Not on file  Social History Narrative   Married, retired Nurse, learning disability 4 daughters, one of whom is a Engineer, civil (consulting). Several grandchildren who he loves to play with.   1 coffee daily   02/21/2017   Social Drivers of Health   Financial Resource Strain: Low Risk  (02/28/2024)   Overall Financial Resource Strain (CARDIA)    Difficulty of Paying Living Expenses: Not very hard  Food Insecurity: No Food Insecurity (02/28/2024)   Hunger Vital Sign    Worried About Running Out of Food in the Last Year: Never true    Ran Out of Food in the Last Year: Never true  Transportation Needs: No Transportation Needs (02/28/2024)   PRAPARE - Administrator, Civil Service (Medical): No    Lack of Transportation (Non-Medical): No  Physical Activity: Sufficiently Active (02/28/2024)   Exercise Vital Sign    Days of Exercise per Week: 7 days    Minutes of Exercise per Session: 30 min  Stress: No Stress Concern Present (02/28/2024)   [employer] of Occupational Health - Occupational Stress Questionnaire    Feeling of Stress : Not at all  Social Connections: Socially Integrated (02/28/2024)   Social Connection and Isolation Panel [NHANES]    Frequency of Communication with Friends and Family: More than three times a week    Frequency of Social Gatherings with Friends and Family: Once a week    Attends Religious Services: More than 4 times per year    Active Member of [employer] or Organizations: Yes    Attends Banker Meetings: 1 to 4 times  per year    Marital Status: Married  Catering manager Violence: Not At Risk (08/26/2022)   Humiliation, Afraid, Rape, and Kick questionnaire    Fear of Current or Ex-Partner: No    Emotionally Abused: No    Physically Abused: No    Sexually Abused: No    Family History  Problem Relation Age of Onset   Stroke Father    Cancer Father        kidney ca   Angina Mother    Colon cancer Neg Hx    Esophageal cancer Neg Hx    Rectal cancer Neg Hx    Stomach cancer Neg Hx     Current Outpatient Medications on File Prior to Visit  Medication Sig Dispense Refill   aspirin  EC 81 MG tablet Take 81 mg by mouth daily. Swallow whole.     carvedilol  (COREG ) 25 MG tablet TAKE 1 TABLET BY MOUTH TWICE DAILY WITH A MEAL 180  tablet 2   dorzolamide -timolol  (COSOPT ) 22.3-6.8 MG/ML ophthalmic solution Place 1 drop into both eyes 2 (two) times daily.     empagliflozin  (JARDIANCE ) 10 MG TABS tablet Take 1 tablet (10 mg total) by mouth daily before breakfast. 90 tablet 3   furosemide  (LASIX ) 40 MG tablet Take 1 tablet by mouth once daily 90 tablet 3   levothyroxine  (SYNTHROID ) 75 MCG tablet Take 1 tablet (75 mcg total) by mouth daily before breakfast. 90 tablet 3   Melatonin 10 MG TABS Take 20 mg by mouth at bedtime.     mexiletine (MEXITIL) 150 MG capsule TAKE 1 CAPSULE BY MOUTH TWICE A DAY 180 capsule 2   potassium chloride  (KLOR-CON ) 10 MEQ tablet Take 1 tablet (10 mEq total) by mouth daily. 30 tablet 3   rosuvastatin  (CRESTOR ) 5 MG tablet TAKE 1 TABLET BY MOUTH EVERY OTHER DAY 45 tablet 0   sacubitril -valsartan  (ENTRESTO ) 49-51 MG Take 1 tablet by mouth 2 (two) times daily. 180 tablet 3   sodium fluoride  (FLUORISHIELD) 1.1 % GEL dental gel Place 1 Application onto teeth at bedtime. 120 mL 11   No current facility-administered medications on file prior to visit.    No Known Allergies     Physical Exam Vitals requested from patient and listed below if patient had equipment and was able to obtain at  home for this virtual visit: There were no vitals filed for this visit. Estimated body mass index is 23.18 kg/m as calculated from the following:   Height as of 10/31/23: 5\' 9"  (1.753 m).   Weight as of 10/31/23: 157 lb (71.2 kg).  EKG (optional): deferred due to virtual visit  GENERAL: alert, oriented, no acute distress detected; full vision exam deferred due to pandemic and/or virtual encounter  PSYCH/NEURO: pleasant and cooperative, no obvious depression or anxiety, speech and thought processing grossly intact, Cognitive function grossly intact  Flowsheet Row Clinical Support from 02/28/2024 in [facility]  PHQ-9 Total Score 0           02/28/2024    3:24 PM 11/16/2022    9:58 AM 10/20/2022   11:12 AM 10/20/2022   10:22 AM 04/21/2022   10:01 AM  Depression screen PHQ 2/9  Decreased Interest 0 0 0 0 0  Down, Depressed, Hopeless 0 0 0 0 0  PHQ - 2 Score 0 0 0 0 0  Altered sleeping 0 0 1  0  Tired, decreased energy 0 0 0  0  Change in appetite 0 0 0  0  Feeling bad or failure about yourself  0 0 0  0  Trouble concentrating 0 0 0  0  Moving slowly or fidgety/restless 0 0 0  0  Suicidal thoughts 0 0 0  0  PHQ-9 Score 0 0 1  0  Difficult doing work/chores     Not difficult at all       08/26/2022    7:44 PM 08/27/2022    9:13 AM 10/20/2022   11:12 AM 11/16/2022    9:57 AM 02/28/2024    3:24 PM  Fall Risk  Falls in the past year?   0 0 0  Was there an injury with Fall?   0 0 0  Fall Risk Category Calculator   0 0 0  Fall Risk Category (Retired)   Low    (RETIRED) Patient Fall Risk Level Moderate fall risk Moderate fall risk Low fall risk    Patient at Risk for Falls  Due to   No Fall Risks  No Fall Risks  Fall risk Follow up   Falls evaluation completed  Falls evaluation completed     SUMMARY AND PLAN:  Encounter for Medicare annual wellness exam  Discussed applicable health maintenance/preventive health measures and advised and referred  or ordered per patient preferences:  Health Maintenance  Topic Date Due   COVID-19 Vaccine (2 - Janssen risk series) 12/03/2027 (Originally 03/08/2020)   INFLUENZA VACCINE  05/11/2024   Medicare Annual Wellness (AWV)  02/27/2025   Pneumonia Vaccine 82+ Years old  Completed   Zoster Vaccines- Shingrix  Completed   HPV VACCINES  Aged Out   Meningococcal B Vaccine  Aged Out   DTaP/Tdap/Td  Discontinued   Colonoscopy  Discontinued     Education and counseling on the following was provided based on the above review of health and a plan/checklist for the patient, along with additional information discussed, was provided for the patient in the patient instructions :  -Advised and counseled on a healthy lifestyle - including the importance of a healthy diet, regular physical activity, social connections and stress management. -Reviewed patient's current diet. Advised and counseled on a whole foods based healthy diet. A summary of a healthy diet was provided in the Patient Instructions.  -reviewed patient's current physical activity level and discussed exercise guidelines for adults. Provided further resources in patient instructions.  -Advise yearly dental visits at minimum and regular eye exams   Follow up: see patient instructions   Patient Instructions  I really enjoyed getting to talk with you today! I am available on Tuesdays and Thursdays for virtual visits if you have any questions or concerns, or if I can be of any further assistance.   CHECKLIST FROM ANNUAL WELLNESS VISIT:  -Follow up (please call to schedule if not scheduled after visit):   -yearly for annual wellness visit with primary care office  Here is a list of your preventive care/health maintenance measures and the plan for each if any are due:  PLAN For any measures below that may be due:  -can get vaccines at the pharmacy, please let us  know if you do so that we can update your record  Health Maintenance  Topic  Date Due   COVID-19 Vaccine (2 - Janssen risk series) 12/03/2027 (Originally 03/08/2020)   INFLUENZA VACCINE  05/11/2024   Medicare Annual Wellness (AWV)  02/27/2025   Pneumonia Vaccine 90+ Years old  Completed   Zoster Vaccines- Shingrix  Completed   HPV VACCINES  Aged Out   Meningococcal B Vaccine  Aged Out   DTaP/Tdap/Td  Discontinued   Colonoscopy  Discontinued    -See a dentist at least yearly  -Get your eyes checked and then per your eye specialist's recommendations  -Other issues addressed today:   -I have included below further information regarding a healthy whole foods based diet, physical activity guidelines for adults, stress management and opportunities for social connections. I hope you find this information useful.   -----------------------------------------------------------------------------------------------------------------------------------------------------------------------------------------------------------------------------------------------------------    NUTRITION: -eat real food: lots of colorful vegetables (half the plate) and fruits -5-7 servings of vegetables and fruits per day (fresh or steamed is best), exp. 2 servings of vegetables with lunch and dinner and 2 servings of fruit per day. Berries and greens such as kale and collards are great choices.  -consume on a regular basis:  fresh fruits, fresh veggies, fish, nuts, seeds, healthy oils (such as olive oil, avocado oil), whole grains (make sure for bread/pasta/crackers/etc.,  that the first ingredient on label contains the word "whole"), legumes. -can eat small amounts of dairy and lean meat (no larger than the palm of your hand), but avoid processed meats such as ham, bacon, lunch meat, etc. -drink water  -try to avoid fast food and pre-packaged foods, processed meat, ultra processed foods/beverages (donuts, candy, etc.) -most experts advise limiting sodium to < 2300mg  per day, should limit further is  any chronic conditions such as high blood pressure, heart disease, diabetes, etc. The American Heart Association advised that < 1500mg  is is ideal -try to avoid foods/beverages that contain any ingredients with names you do not recognize  -try to avoid foods/beverages  with added sugar or sweeteners/sweets  -try to avoid sweet drinks (including diet drinks): soda, juice, Gatorade, sweet tea, power drinks, diet drinks -try to avoid white rice, white bread, pasta (unless whole grain)  EXERCISE GUIDELINES FOR ADULTS: -if you wish to increase your physical activity, do so gradually and with the approval of your doctor -STOP and seek medical care immediately if you have any chest pain, chest discomfort or trouble breathing when starting or increasing exercise  -move and stretch your body, legs, feet and arms when sitting for long periods -Physical activity guidelines for optimal health in adults: -get at least 150 minutes per week of moderate exercise (can talk, but not sing); this is about 20-30 minutes of sustained activity 5-7 days per week or two 10-15 minute episodes of sustained activity 5-7 days per week -do some muscle building/resistance training/strength training at least 2 days per week  -balance exercises 3+ days per week:   Stand somewhere where you have something sturdy to hold onto if you lose balance    1) lift up on toes, then back down, start with 5x per day and work up to 20x   2) stand and lift one leg straight out to the side so that foot is a few inches of the floor, start with 5x each side and work up to 20x each side   3) stand on one foot, start with 5 seconds each side and work up to 20 seconds on each side  If you need ideas or help with getting more active:  -Silver sneakers https://tools.silversneakers.com  -Walk with a Doc: http://www.duncan-williams.com/  -try to include resistance (weight lifting/strength building) and balance exercises twice per week: or the following  link for ideas: http://castillo-powell.com/  BuyDucts.dk  STRESS MANAGEMENT: -can try meditating, or just sitting quietly with deep breathing while intentionally relaxing all parts of your body for 5 minutes daily -if you need further help with stress, anxiety or depression please follow up with your primary doctor or contact the wonderful folks at [facility]: [phone]  SOCIAL CONNECTIONS: -options in [place] if you wish to engage in more social and exercise related activities:  -Silver sneakers https://tools.silversneakers.com  -Walk with a Doc: http://www.duncan-williams.com/  -Check out the [facility] Active Adults 50+ section on the [employer] (hiking clubs, book clubs, cards and games, chess, exercise classes, aquatic classes and much more) - see the website for details: https://www.[place].gov/departments/parks-recreation/active-adults50  -YouTube has lots of exercise videos for different ages and abilities as well  -[facility] (a variety of indoor and outdoor inperson activities for adults). (438)647-6286. [address]  -Virtual Online Classes (a variety of topics): see seniorplanet.org or call (639)524-0535  -consider volunteering at a school, hospice center, church, senior center or elsewhere  Maurie Southern, DO

## 2024-02-28 NOTE — Progress Notes (Signed)
 Patient was unable to self-report due to a lack of equipment at home via telehealth

## 2024-02-28 NOTE — Patient Instructions (Addendum)
 I really enjoyed getting to talk with you today! I am available on Tuesdays and Thursdays for virtual visits if you have any questions or concerns, or if I can be of any further assistance.   CHECKLIST FROM ANNUAL WELLNESS VISIT:  -Follow up (please call to schedule if not scheduled after visit):   -yearly for annual wellness visit with primary care office  Here is a list of your preventive care/health maintenance measures and the plan for each if any are due:  PLAN For any measures below that may be due:  -can get vaccines at the pharmacy, please let us  know if you do so that we can update your record  Health Maintenance  Topic Date Due   COVID-19 Vaccine (2 - Janssen risk series) 12/03/2027 (Originally 03/08/2020)   INFLUENZA VACCINE  05/11/2024   Medicare Annual Wellness (AWV)  02/27/2025   Pneumonia Vaccine 79+ Years old  Completed   Zoster Vaccines- Shingrix  Completed   HPV VACCINES  Aged Out   Meningococcal B Vaccine  Aged Out   DTaP/Tdap/Td  Discontinued   Colonoscopy  Discontinued    -See a dentist at least yearly  -Get your eyes checked and then per your eye specialist's recommendations  -Other issues addressed today:   -I have included below further information regarding a healthy whole foods based diet, physical activity guidelines for adults, stress management and opportunities for social connections. I hope you find this information useful.   -----------------------------------------------------------------------------------------------------------------------------------------------------------------------------------------------------------------------------------------------------------    NUTRITION: -eat real food: lots of colorful vegetables (half the plate) and fruits -5-7 servings of vegetables and fruits per day (fresh or steamed is best), exp. 2 servings of vegetables with lunch and dinner and 2 servings of fruit per day. Berries and greens such as kale  and collards are great choices.  -consume on a regular basis:  fresh fruits, fresh veggies, fish, nuts, seeds, healthy oils (such as olive oil, avocado oil), whole grains (make sure for bread/pasta/crackers/etc., that the first ingredient on label contains the word "whole"), legumes. -can eat small amounts of dairy and lean meat (no larger than the palm of your hand), but avoid processed meats such as ham, bacon, lunch meat, etc. -drink water  -try to avoid fast food and pre-packaged foods, processed meat, ultra processed foods/beverages (donuts, candy, etc.) -most experts advise limiting sodium to < 2300mg  per day, should limit further is any chronic conditions such as high blood pressure, heart disease, diabetes, etc. The American Heart Association advised that < 1500mg  is is ideal -try to avoid foods/beverages that contain any ingredients with names you do not recognize  -try to avoid foods/beverages  with added sugar or sweeteners/sweets  -try to avoid sweet drinks (including diet drinks): soda, juice, Gatorade, sweet tea, power drinks, diet drinks -try to avoid white rice, white bread, pasta (unless whole grain)  EXERCISE GUIDELINES FOR ADULTS: -if you wish to increase your physical activity, do so gradually and with the approval of your doctor -STOP and seek medical care immediately if you have any chest pain, chest discomfort or trouble breathing when starting or increasing exercise  -move and stretch your body, legs, feet and arms when sitting for long periods -Physical activity guidelines for optimal health in adults: -get at least 150 minutes per week of moderate exercise (can talk, but not sing); this is about 20-30 minutes of sustained activity 5-7 days per week or two 10-15 minute episodes of sustained activity 5-7 days per week -do some muscle building/resistance training/strength training at  least 2 days per week  -balance exercises 3+ days per week:   Stand somewhere where you have  something sturdy to hold onto if you lose balance    1) lift up on toes, then back down, start with 5x per day and work up to 20x   2) stand and lift one leg straight out to the side so that foot is a few inches of the floor, start with 5x each side and work up to 20x each side   3) stand on one foot, start with 5 seconds each side and work up to 20 seconds on each side  If you need ideas or help with getting more active:  -Silver sneakers https://tools.silversneakers.com  -Walk with a Doc: http://www.duncan-williams.com/  -try to include resistance (weight lifting/strength building) and balance exercises twice per week: or the following link for ideas: http://castillo-powell.com/  BuyDucts.dk  STRESS MANAGEMENT: -can try meditating, or just sitting quietly with deep breathing while intentionally relaxing all parts of your body for 5 minutes daily -if you need further help with stress, anxiety or depression please follow up with your primary doctor or contact the wonderful folks at [facility]: [phone]  SOCIAL CONNECTIONS: -options in [place] if you wish to engage in more social and exercise related activities:  -Silver sneakers https://tools.silversneakers.com  -Walk with a Doc: http://www.duncan-williams.com/  -Check out the [facility] Active Adults 50+ section on the [place] [employer] (hiking clubs, book clubs, cards and games, chess, exercise classes, aquatic classes and much more) - see the website for details: https://www.[place].gov/departments/parks-recreation/active-adults50  -YouTube has lots of exercise videos for different ages and abilities as well  -[facility] (a variety of indoor and outdoor inperson activities for adults). [phone]. [address]  -Virtual Online Classes (a variety of topics): see seniorplanet.org or call  [phone]  -consider volunteering at a school, hospice center, church, senior center or elsewhere

## 2024-03-30 ENCOUNTER — Other Ambulatory Visit (HOSPITAL_COMMUNITY): Payer: Self-pay

## 2024-03-30 ENCOUNTER — Ambulatory Visit: Payer: Self-pay | Admitting: Cardiology

## 2024-03-30 ENCOUNTER — Ambulatory Visit (INDEPENDENT_AMBULATORY_CARE_PROVIDER_SITE_OTHER): Payer: PPO

## 2024-03-30 DIAGNOSIS — I420 Dilated cardiomyopathy: Secondary | ICD-10-CM | POA: Diagnosis not present

## 2024-03-30 LAB — CUP PACEART REMOTE DEVICE CHECK
Battery Remaining Longevity: 92 mo
Battery Remaining Percentage: 73 %
Battery Voltage: 3.01 V
Brady Statistic RV Percent Paced: 1 %
Date Time Interrogation Session: 20250620020009
HighPow Impedance: 64 Ohm
Implantable Lead Connection Status: 753985
Implantable Lead Implant Date: 20220623
Implantable Lead Location: 753860
Implantable Lead Model: 7122
Implantable Pulse Generator Implant Date: 20220623
Lead Channel Impedance Value: 360 Ohm
Lead Channel Pacing Threshold Amplitude: 0.75 V
Lead Channel Pacing Threshold Pulse Width: 0.5 ms
Lead Channel Sensing Intrinsic Amplitude: 11.8 mV
Lead Channel Setting Pacing Amplitude: 2.5 V
Lead Channel Setting Pacing Pulse Width: 0.5 ms
Lead Channel Setting Sensing Sensitivity: 0.5 mV
Pulse Gen Serial Number: 810027079
Zone Setting Status: 755011

## 2024-04-03 ENCOUNTER — Other Ambulatory Visit: Payer: Self-pay | Admitting: Family Medicine

## 2024-04-15 ENCOUNTER — Other Ambulatory Visit: Payer: Self-pay | Admitting: Family Medicine

## 2024-05-02 NOTE — Progress Notes (Unsigned)
 Nicholas Wilkerson Date of Birth: February 08, 1941   History of Present Illness: Nicholas Wilkerson is seen for followup. He has a history of dilated cardiomyopathy with initial ejection fraction of 30% over 18 years ago. Subsequent evaluation has demonstrated improvement to 50-55%. Last Echo was in January 2018 showing decrease in EF to 35-40%. ACEi was resumed at that time. . 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 He was treated with RT and chemo.   Last November he complained of some palpitations. Event monitor short short bursts of SVT and NSVT. No bradycardia or pauses. We increased his Coreg  dose at that time.  He was seen in February and he noted he had extensive Urologic evaluation for an elevated PSA with no cancer found. He did complain of getting tired real easy with activity.  No edema. No chest pain. He does note some change in his breathing.  We updated an Echocardiogram which demonstrated significant decrease in LV function with  EF down to 20-25%. There was also evidence of significant pulmonary HTN.   We performed cardiac cath showing nonobstructive CAD. Moderately elevated LV filling pressures. Moderate pulmonary HTN.  He was started on Lasix  40 mg daily. Continued Coreg  and Jardiance . Added Entresto . Now up to 49/51 mg bid. Added aldactone  25 mg daily. He is tolerating this well. He has noted that if he takes everything at once in the morning he gets real lightheaded. He states this is much better.   Still active walking and playing golf.   After optimization of medication Echo was repeated and was unchanged with EF 20-25%. He was seen by Dr Inocencio and ICD was implanted in June. Since then on remote monitoring he has 3 episodes of NSVT the longest lasting 6 seconds. He was unaware.   When seen in September 2023  he had increased SOB and PND. Pro BNP was > 6000. His lasix  was increased back to 40 mg daily. Since then his symptoms have improved. No edema. No palpitations. He  has seen Dr Dolan with [facility] for stage 3b CKD.   He was seen by Dr. Inocencio 11/08/22, fatigued with activities, no symptoms otherwise, recent monitor w/18% PVC burden and some VTs reaching his monitor zone, mexiletine was added. Had improved PVC burden on follow up.   On follow up today he is doing great. Stays very active doing home projects. No dizziness. No syncope, dyspnea, or chest pain. Was able to haul off 12 trailers of brush on his own this past week.      Current Outpatient Medications on File Prior to Visit  Medication Sig Dispense Refill   aspirin  EC 81 MG tablet Take 81 mg by mouth daily. Swallow whole.     carvedilol  (COREG ) 25 MG tablet TAKE 1 TABLET BY MOUTH TWICE DAILY WITH A MEAL 180 tablet 2   dorzolamide -timolol  (COSOPT ) 22.3-6.8 MG/ML ophthalmic solution Place 1 drop into both eyes 2 (two) times daily.     furosemide  (LASIX ) 40 MG tablet Take 1 tablet by mouth once daily 90 tablet 3   JARDIANCE  10 MG TABS tablet TAKE 1 TABLET BY MOUTH DAILY 90 tablet 2   levothyroxine  (SYNTHROID ) 75 MCG tablet Take 1 tablet (75 mcg total) by mouth daily before breakfast. 90 tablet 3   Melatonin 10 MG TABS Take 20 mg by mouth at bedtime.     mexiletine (MEXITIL) 150 MG capsule TAKE 1 CAPSULE BY MOUTH TWICE A DAY 180 capsule 2  potassium chloride  (KLOR-CON ) 10 MEQ tablet Take 1 tablet (10 mEq total) by mouth daily. 30 tablet 3   rosuvastatin  (CRESTOR ) 5 MG tablet TAKE 1 TABLET BY MOUTH EVERY OTHER DAY 45 tablet 2   sacubitril -valsartan  (ENTRESTO ) 49-51 MG Take 1 tablet by mouth 2 (two) times daily. 180 tablet 3   sodium fluoride  (FLUORISHIELD) 1.1 % GEL dental gel Place 1 Application onto teeth at bedtime. 120 mL 11   No current facility-administered medications on file prior to visit.    No Known Allergies  Past Medical History:  Diagnosis Date   ARTHRITIS 10/22/2008   BPH (benign prostatic hyperplasia)    CHF (congestive heart failure) (HCC)    sees Dr. Mertha Clyatt [place]     Dilated cardiomyopathy [facility])    DIVERTICULOSIS, COLON 12/08/2007   Glaucoma    sees Dr. Heather [place]    H/O asbestos exposure    History of echocardiogram 05/22/2007   EF was 45-50% / Mild concentric LV hypertrophy with mild global hypokinesis and overall mild systolic dysfunction .  Mild AV sclerosis / Mild Mitral insufficiency / compared to prior study 04/24/02 -- LV function has improved further.     History of kidney stones    Hypercholesterolemia    Hypertension    Insomnia 06/11/2015   Radiation 01/01/15-02/18/15   base of tongue and bilateral neck 70 Gy   Skin cancer    squamous cell, basal cell   Squamous cell carcinoma 11/20/2014   base of tongue primary   Throat cancer (HCC) 10/2014   had chemo   Thyroid  disease     Past Surgical History:  Procedure Laterality Date   CARDIAC CATHETERIZATION  10/17/2001   EF estimated at 30% / moderate LV enlargement  / 1. Minimal nonobstructive atherosclerotic coronary artery disease / 2. Severe LV dysfunction with global hypokinesia consistent with dilated nonischemic cardiomyopath / 3. Moderate pulmonary hypertension   COLONOSCOPY  06/23/2017   per Dr. Avram, clear, no repeats needed    CYSTOSCOPY N/A 08/26/2022   Procedure: CYSTOSCOPY;  Surgeon: Renda Glance, MD;  Location: [facility];  Service: Urology;  Laterality: N/A;  90 MINUTES NEEDED FOR CASE  ANESTHESIA IS GENERAL OR SPINAL   ICD IMPLANT N/A 04/02/2021   Procedure: ICD IMPLANT;  Surgeon: Inocencio Soyla Lunger, MD;  Location: [facility] INVASIVE CV LAB;  Service: Cardiovascular;  Laterality: N/A;   LYMPH NODE BIOPSY     RIGHT/LEFT HEART CATH AND CORONARY ANGIOGRAPHY N/A 07/22/2020   Procedure: RIGHT/LEFT HEART CATH AND CORONARY ANGIOGRAPHY;  Surgeon: [place], Cassian Torelli M, MD;  Location: [facility] INVASIVE CV LAB;  Service: Cardiovascular;  Laterality: N/A;   TRANSURETHRAL RESECTION OF PROSTATE N/A 08/26/2022   Procedure: TRANSURETHRAL RESECTION OF THE PROSTATE (TURP);  Surgeon: Renda Glance,  MD;  Location: [facility];  Service: Urology;  Laterality: N/A;    Social History   Tobacco Use  Smoking Status Never  Smokeless Tobacco Never    Social History   Substance and Sexual Activity  Alcohol Use Yes   Alcohol/week: 0.0 standard drinks of alcohol   Comment: occasional beer, a couple times a month    Family History  Problem Relation Age of Onset   Stroke Father    Cancer Father        kidney ca   Angina Mother    Colon cancer Neg Hx    Esophageal cancer Neg Hx    Rectal cancer Neg Hx    Stomach cancer Neg Hx     Review of Systems:  All other systems were reviewed and are negative.  Physical Exam: There were no vitals taken for this visit. GENERAL:  Well appearing WM in NAD HEENT:  PERRL, EOMI, sclera are clear. Oropharynx is clear. NECK:  No jugular venous distention, carotid upstroke brisk and symmetric, no bruits, no thyromegaly or adenopathy LUNGS:  Clear to auscultation bilaterally CHEST:  Unremarkable HEART:  RRR ,  PMI not displaced or sustained,S1 and S2 within normal limits, no S3, no S4: no clicks, no rubs, no murmurs ABD:  Soft, nontender. BS +, no masses or bruits. No hepatomegaly, no splenomegaly EXT:  2 + pulses throughout, no edema, no cyanosis no clubbing SKIN:  Warm and dry.  No rashes NEURO:  Alert and oriented x 3. Cranial nerves II through XII intact. PSYCH:  Cognitively intact   LABORATORY DATA:  Lab Results  Component Value Date   WBC 7.3 10/24/2023   HGB 17.1 (H) 10/24/2023   HCT 52.6 (H) 10/24/2023   PLT 189.0 10/24/2023   GLUCOSE 97 10/24/2023   CHOL 148 10/24/2023   TRIG 99.0 10/24/2023   HDL 50.50 10/24/2023   LDLCALC 78 10/24/2023   ALT 16 10/24/2023   AST 17 10/24/2023   NA 140 10/24/2023   K 4.2 10/24/2023   CL 102 10/24/2023   CREATININE 1.72 (H) 10/24/2023   BUN 28 (H) 10/24/2023   CO2 27 10/24/2023   TSH 2.21 10/24/2023   PSA 7.93 (H) 10/15/2019   INR 0.95 07/17/2015   HGBA1C 6.3 10/24/2023   Ecg not done  today    Echo 11/02/16: Study Conclusions   - Left ventricle: The cavity size was mildly dilated. Wall   thickness was normal. Systolic function was moderately reduced.   The estimated ejection fraction was in the range of 35% to 40%.   Diffuse hypokinesis. Echogenic linear structure at the apex,   suggestive of calcified apical false tendon. Doppler parameters   are consistent with pseudonormal left ventricular relaxation   (grade 2 diastolic dysfunction). The E/e&' ratio is >15,   suggesting elevated LV filling pressure. - Mitral valve: Mildly thickened leaflets . There was mild   regurgitation. - Left atrium: The atrium was mildly dilated. - Inferior vena cava: The vessel was dilated. The respirophasic   diameter changes were blunted (< 50%), consistent with elevated   central venous pressure.   Impressions:   - Compared to a prior study in 2012, the LVEF is lower at 35-40%.   The LV is mildly dilated and globally hypokinetic. LV filling   pressure appears elevated with grade 2 DD.  Echo 07/14/20: 1. Left ventricular ejection fraction, by estimation, is 20 to 25%. The left ventricle has severely decreased function. The left ventricle demonstrates global hypokinesis. The left ventricular internal cavity size was mildly dilated. Left ventricular diastolic parameters are consistent with Grade III diastolic dysfunction (restrictive). Elevated left ventricular end-diastolic pressure. 2. Right ventricular systolic function is normal. The right ventricular size is normal. There is severely elevated pulmonary artery systolic pressure. 3. Left atrial size was severely dilated. 4. The mitral valve is normal in structure. Mild mitral valve regurgitation. No evidence of mitral stenosis. 5. The aortic valve is tricuspid. There is mild calcification of the aortic valve. There is mild thickening of the aortic valve. Aortic valve regurgitation is not visualized. No aortic stenosis is  present. 6. The inferior vena cava is normal in size with <50% respiratory variability, suggesting right atrial pressure of 8 mmHg.  Cardiac cath 07/22/20:  RIGHT/LEFT HEART CATH AND CORONARY ANGIOGRAPHY  Conclusion    Prox LAD to Mid LAD lesion is 25% stenosed. Prox Cx to Dist Cx lesion is 25% stenosed. Prox RCA to Mid RCA lesion is 15% stenosed. LV end diastolic pressure is moderately elevated. Hemodynamic findings consistent with moderate pulmonary hypertension.   1. Nonobstructive CAD 2. Low cardiac output. Index 1.86 3. Moderately elevated LV filling pressures 4. Moderate Pulmonary HTN   Plan: optimize medical therapy for CHF. On Coreg . Just started Entresto . Will add lasix  40 mg daily. Further titration of medication depending on initial response.    Event monitor 10/03/19: Study Highlights  Normal sinus rhythm Occasional runs of NSVT longest lasting 7 beats. Infrequent runs of SVT longest 9 beats. No significant bradycardia or pauses Average HR 71 bpm     Echo 01/05/21: IMPRESSIONS     1. Left ventricular ejection fraction, by estimation, is 20 to 25%. The  left ventricle has severely decreased function. The left ventricle  demonstrates global hypokinesis. The left ventricular internal cavity size  was mildly to moderately dilated. Left  ventricular diastolic parameters are consistent with Grade II diastolic  dysfunction (pseudonormalization). Elevated left atrial pressure.   2. Right ventricular systolic function is normal. The right ventricular  size is normal.   3. Left atrial size was mild to moderately dilated.   4. The mitral valve is normal in structure. Mild mitral valve  regurgitation.   5. The aortic valve is tricuspid. Aortic valve regurgitation is not  visualized. Mild aortic valve sclerosis is present, with no evidence of  aortic valve stenosis.   6. The inferior vena cava is normal in size with greater than 50%  respiratory variability,  suggesting right atrial pressure of 3 mmHg.   Comparison(s): The left ventricular function is unchanged.    Assessment / Plan: 1. Dilated cardiomyopathy with EF 35-40% in 2018.   Echo shows significant decline in LV function with EF 20-25%.  Symptoms well controlled now.  Now class 1-2. He is on optimal medical therapy.   S/p ICD implant followed by Dr Inocencio.   2. Hypertension well controlled.  3. PVCs/brief SVT/NSVT.  Continue carvedilol  but at lower dose 25 mg bid. On Mexitil per EP  4. SSCA of the tongue. S/p RT and chemo.   5. CKD stage 3b. Followed by Nephrology.    Follow up in 6 months

## 2024-05-04 ENCOUNTER — Encounter: Payer: Self-pay | Admitting: Cardiology

## 2024-05-04 ENCOUNTER — Ambulatory Visit: Attending: Cardiology | Admitting: Cardiology

## 2024-05-04 VITALS — BP 104/50 | HR 53 | Ht 69.0 in | Wt 157.0 lb

## 2024-05-04 DIAGNOSIS — N1832 Chronic kidney disease, stage 3b: Secondary | ICD-10-CM | POA: Diagnosis not present

## 2024-05-04 DIAGNOSIS — Z95810 Presence of automatic (implantable) cardiac defibrillator: Secondary | ICD-10-CM | POA: Diagnosis not present

## 2024-05-04 DIAGNOSIS — I5022 Chronic systolic (congestive) heart failure: Secondary | ICD-10-CM | POA: Diagnosis not present

## 2024-05-04 NOTE — Patient Instructions (Signed)

## 2024-05-06 ENCOUNTER — Other Ambulatory Visit: Payer: Self-pay | Admitting: Cardiology

## 2024-05-10 DIAGNOSIS — H401112 Primary open-angle glaucoma, right eye, moderate stage: Secondary | ICD-10-CM | POA: Diagnosis not present

## 2024-05-21 DIAGNOSIS — N1832 Chronic kidney disease, stage 3b: Secondary | ICD-10-CM | POA: Diagnosis not present

## 2024-05-28 DIAGNOSIS — N1832 Chronic kidney disease, stage 3b: Secondary | ICD-10-CM | POA: Diagnosis not present

## 2024-05-28 DIAGNOSIS — N1330 Unspecified hydronephrosis: Secondary | ICD-10-CM | POA: Diagnosis not present

## 2024-05-28 DIAGNOSIS — I129 Hypertensive chronic kidney disease with stage 1 through stage 4 chronic kidney disease, or unspecified chronic kidney disease: Secondary | ICD-10-CM | POA: Diagnosis not present

## 2024-05-28 DIAGNOSIS — R351 Nocturia: Secondary | ICD-10-CM | POA: Diagnosis not present

## 2024-05-28 DIAGNOSIS — N401 Enlarged prostate with lower urinary tract symptoms: Secondary | ICD-10-CM | POA: Diagnosis not present

## 2024-05-28 DIAGNOSIS — I5042 Chronic combined systolic (congestive) and diastolic (congestive) heart failure: Secondary | ICD-10-CM | POA: Diagnosis not present

## 2024-05-28 NOTE — Progress Notes (Signed)
 Remote ICD transmission.

## 2024-06-05 ENCOUNTER — Other Ambulatory Visit: Payer: Self-pay | Admitting: Cardiology

## 2024-06-25 ENCOUNTER — Ambulatory Visit (INDEPENDENT_AMBULATORY_CARE_PROVIDER_SITE_OTHER): Admitting: Internal Medicine

## 2024-06-25 ENCOUNTER — Encounter: Payer: Self-pay | Admitting: Internal Medicine

## 2024-06-25 VITALS — BP 110/70 | HR 70 | Temp 97.6°F | Wt 157.1 lb

## 2024-06-25 DIAGNOSIS — R319 Hematuria, unspecified: Secondary | ICD-10-CM

## 2024-06-25 LAB — POC URINALSYSI DIPSTICK (AUTOMATED)
Bilirubin, UA: NEGATIVE
Glucose, UA: POSITIVE — AB
Ketones, UA: NEGATIVE
Leukocytes, UA: NEGATIVE
Nitrite, UA: NEGATIVE
Protein, UA: NEGATIVE
Spec Grav, UA: 1.01 (ref 1.010–1.025)
Urobilinogen, UA: 0.2 U/dL
pH, UA: 5.5 (ref 5.0–8.0)

## 2024-06-25 MED ORDER — SULFAMETHOXAZOLE-TRIMETHOPRIM 800-160 MG PO TABS: 1.0000 | ORAL_TABLET | Freq: Two times a day (BID) | ORAL | 0 refills | Status: AC

## 2024-06-25 NOTE — Progress Notes (Signed)
 Established Patient Office Visit     CC/Reason for Visit: Blood in urine  HPI: Nicholas Wilkerson is a 83 y.o. male who is coming in today for the above mentioned reasons.  Has been experiencing hematuria for 2 days.  Feels like it is getting better today.  No dysuria.   Past Medical/Surgical History: Past Medical History:  Diagnosis Date   ARTHRITIS 10/22/2008   BPH (benign prostatic hyperplasia)    CHF (congestive heart failure) (HCC)    sees Dr. Peter [place]    Dilated cardiomyopathy [facility])    DIVERTICULOSIS, COLON 12/08/2007   Glaucoma    sees Dr. Heather [place]    H/O asbestos exposure    History of echocardiogram 05/22/2007   EF was 45-50% / Mild concentric LV hypertrophy with mild global hypokinesis and overall mild systolic dysfunction .  Mild AV sclerosis / Mild Mitral insufficiency / compared to prior study 04/24/02 -- LV function has improved further.     History of kidney stones    Hypercholesterolemia    Hypertension    Insomnia 06/11/2015   Radiation 01/01/15-02/18/15   base of tongue and bilateral neck 70 Gy   Skin cancer    squamous cell, basal cell   Squamous cell carcinoma 11/20/2014   base of tongue primary   Throat cancer (HCC) 10/2014   had chemo   Thyroid  disease     Past Surgical History:  Procedure Laterality Date   CARDIAC CATHETERIZATION  10/17/2001   EF estimated at 30% / moderate LV enlargement  / 1. Minimal nonobstructive atherosclerotic coronary artery disease / 2. Severe LV dysfunction with global hypokinesia consistent with dilated nonischemic cardiomyopath / 3. Moderate pulmonary hypertension   COLONOSCOPY  06/23/2017   per Dr. Avram, clear, no repeats needed    CYSTOSCOPY N/A 08/26/2022   Procedure: CYSTOSCOPY;  Surgeon: Renda Glance, MD;  Location: [facility];  Service: Urology;  Laterality: N/A;  90 MINUTES NEEDED FOR CASE  ANESTHESIA IS GENERAL OR SPINAL   ICD IMPLANT N/A 04/02/2021   Procedure: ICD IMPLANT;  Surgeon: Inocencio Soyla Lunger, MD;  Location: [facility] INVASIVE CV LAB;  Service: Cardiovascular;  Laterality: N/A;   LYMPH NODE BIOPSY     RIGHT/LEFT HEART CATH AND CORONARY ANGIOGRAPHY N/A 07/22/2020   Procedure: RIGHT/LEFT HEART CATH AND CORONARY ANGIOGRAPHY;  Surgeon: [place], [name], MD;  Location: [facility] INVASIVE CV LAB;  Service: Cardiovascular;  Laterality: N/A;   TRANSURETHRAL RESECTION OF PROSTATE N/A 08/26/2022   Procedure: TRANSURETHRAL RESECTION OF THE PROSTATE (TURP);  Surgeon: Renda Glance, MD;  Location: [facility];  Service: Urology;  Laterality: N/A;    Social History:  reports that he has never smoked. He has never used smokeless tobacco. He reports current alcohol use. He reports that he does not use drugs.  Allergies: No Known Allergies  Family History:  Family History  Problem Relation Age of Onset   Stroke Father    Cancer Father        kidney ca   Angina Mother    Colon cancer Neg Hx    Esophageal cancer Neg Hx    Rectal cancer Neg Hx    Stomach cancer Neg Hx      Current Outpatient Medications:    aspirin  EC 81 MG tablet, Take 81 mg by mouth daily. Swallow whole., Disp: , Rfl:    carvedilol  (COREG ) 25 MG tablet, TAKE 1 TABLET BY MOUTH TWICE DAILY WITH A MEAL, Disp: 180 tablet, Rfl: 3  dorzolamide -timolol  (COSOPT ) 22.3-6.8 MG/ML ophthalmic solution, Place 1 drop into both eyes 2 (two) times daily., Disp: , Rfl:    furosemide  (LASIX ) 40 MG tablet, Take 1 tablet by mouth once daily, Disp: 90 tablet, Rfl: 2   JARDIANCE  10 MG TABS tablet, TAKE 1 TABLET BY MOUTH DAILY, Disp: 90 tablet, Rfl: 2   levothyroxine  (SYNTHROID ) 75 MCG tablet, Take 1 tablet (75 mcg total) by mouth daily before breakfast., Disp: 90 tablet, Rfl: 3   Melatonin 10 MG TABS, Take 20 mg by mouth at bedtime., Disp: , Rfl:    mexiletine (MEXITIL) 150 MG capsule, TAKE 1 CAPSULE BY MOUTH TWICE A DAY, Disp: 180 capsule, Rfl: 2   potassium chloride  (KLOR-CON ) 10 MEQ tablet, Take 1 tablet (10 mEq total) by mouth daily., Disp: 30  tablet, Rfl: 3   rosuvastatin  (CRESTOR ) 5 MG tablet, TAKE 1 TABLET BY MOUTH EVERY OTHER DAY, Disp: 45 tablet, Rfl: 2   sacubitril -valsartan  (ENTRESTO ) 49-51 MG, Take 1 tablet by mouth 2 (two) times daily., Disp: 180 tablet, Rfl: 3   sodium fluoride  (FLUORISHIELD) 1.1 % GEL dental gel, Place 1 Application onto teeth at bedtime., Disp: 120 mL, Rfl: 11   sulfamethoxazole -trimethoprim  (BACTRIM  DS) 800-160 MG tablet, Take 1 tablet by mouth 2 (two) times daily for 7 days., Disp: 14 tablet, Rfl: 0  Review of Systems:  Negative unless indicated in HPI.   Physical Exam: Vitals:   06/25/24 1615  BP: 110/70  Pulse: 70  Temp: 97.6 F (36.4 C)  TempSrc: Oral  SpO2: 98%  Weight: 157 lb 1.6 oz (71.3 kg)    Body mass index is 23.2 kg/m.    Impression and Plan:  Hematuria, unspecified type -     POCT Urinalysis Dipstick (Automated) -     Urine Culture -     Urinalysis; Future -     Sulfamethoxazole -Trimethoprim ; Take 1 tablet by mouth 2 (two) times daily for 7 days.  Dispense: 14 tablet; Refill: 0   - In office urine dipstick shows 3+ blood.  Will send for culture, treat with 7 days of Bactrim .  Have asked him to schedule a follow-up in 3 weeks for repeat UA.  If persistent hematuria may need imaging +/- referral to urology for further workup.  Time spent:23 minutes reviewing chart, interviewing and examining patient and formulating plan of care.     Tully Theophilus Andrews, MD [facility]

## 2024-06-26 LAB — URINE CULTURE
MICRO NUMBER:: 16968050
Result:: NO GROWTH
SPECIMEN QUALITY:: ADEQUATE

## 2024-06-27 ENCOUNTER — Telehealth: Payer: Self-pay

## 2024-06-27 NOTE — Telephone Encounter (Signed)
 Copied from CRM [id number]. Topic: Clinical - Lab/Test Results >> Jun 27, 2024 10:15 AM Larissa RAMAN wrote: Reason for CRM: Patient calling to check the status of lab order, no provider note available at this time. Patient requesting a callback.

## 2024-06-28 NOTE — Telephone Encounter (Signed)
 Pt was seen by Dr Theophilus, urine culture results have not been resulted yet, pt will be notified when ready

## 2024-06-28 NOTE — Telephone Encounter (Signed)
 Patient called back to check on the status of urine culture. No provider notations currently available. Patient would like a call back to discuss once available.  CB#[phone]

## 2024-06-29 ENCOUNTER — Ambulatory Visit (INDEPENDENT_AMBULATORY_CARE_PROVIDER_SITE_OTHER): Payer: PPO

## 2024-06-29 DIAGNOSIS — I428 Other cardiomyopathies: Secondary | ICD-10-CM

## 2024-06-29 LAB — CUP PACEART REMOTE DEVICE CHECK
Battery Remaining Longevity: 89 mo
Battery Remaining Percentage: 72 %
Battery Voltage: 3.01 V
Brady Statistic RV Percent Paced: 1 %
Date Time Interrogation Session: 20250919021635
HighPow Impedance: 71 Ohm
Implantable Lead Connection Status: 753985
Implantable Lead Implant Date: 20220623
Implantable Lead Location: 753860
Implantable Lead Model: 7122
Implantable Pulse Generator Implant Date: 20220623
Lead Channel Impedance Value: 350 Ohm
Lead Channel Pacing Threshold Amplitude: 0.75 V
Lead Channel Pacing Threshold Pulse Width: 0.5 ms
Lead Channel Sensing Intrinsic Amplitude: 11.8 mV
Lead Channel Setting Pacing Amplitude: 2.5 V
Lead Channel Setting Pacing Pulse Width: 0.5 ms
Lead Channel Setting Sensing Sensitivity: 0.5 mV
Pulse Gen Serial Number: 810027079
Zone Setting Status: 755011

## 2024-07-02 ENCOUNTER — Ambulatory Visit: Payer: Self-pay | Admitting: Cardiology

## 2024-07-03 NOTE — Progress Notes (Signed)
Remote ICD Transmission.

## 2024-07-09 DIAGNOSIS — L821 Other seborrheic keratosis: Secondary | ICD-10-CM | POA: Diagnosis not present

## 2024-07-09 DIAGNOSIS — Z85828 Personal history of other malignant neoplasm of skin: Secondary | ICD-10-CM | POA: Diagnosis not present

## 2024-07-09 DIAGNOSIS — D692 Other nonthrombocytopenic purpura: Secondary | ICD-10-CM | POA: Diagnosis not present

## 2024-07-09 DIAGNOSIS — D1801 Hemangioma of skin and subcutaneous tissue: Secondary | ICD-10-CM | POA: Diagnosis not present

## 2024-07-09 DIAGNOSIS — L570 Actinic keratosis: Secondary | ICD-10-CM | POA: Diagnosis not present

## 2024-07-09 DIAGNOSIS — L814 Other melanin hyperpigmentation: Secondary | ICD-10-CM | POA: Diagnosis not present

## 2024-07-13 ENCOUNTER — Ambulatory Visit: Admitting: Family Medicine

## 2024-07-16 ENCOUNTER — Ambulatory Visit: Admitting: Family Medicine

## 2024-07-16 ENCOUNTER — Encounter: Payer: Self-pay | Admitting: Family Medicine

## 2024-07-16 VITALS — BP 102/60 | HR 67 | Temp 97.6°F | Wt 157.6 lb

## 2024-07-16 DIAGNOSIS — R319 Hematuria, unspecified: Secondary | ICD-10-CM | POA: Diagnosis not present

## 2024-07-16 LAB — POC URINALSYSI DIPSTICK (AUTOMATED)
Bilirubin, UA: NEGATIVE
Blood, UA: POSITIVE
Glucose, UA: POSITIVE — AB
Ketones, UA: NEGATIVE
Leukocytes, UA: NEGATIVE
Nitrite, UA: NEGATIVE
Protein, UA: NEGATIVE
Spec Grav, UA: 1.01 (ref 1.010–1.025)
Urobilinogen, UA: 0.2 U/dL
pH, UA: 5.5 (ref 5.0–8.0)

## 2024-07-16 NOTE — Addendum Note (Signed)
 Addended by: LADONNA INOCENTE SAILOR on: 07/16/2024 11:57 AM   Modules accepted: Orders

## 2024-07-16 NOTE — Progress Notes (Signed)
   Subjective:    Patient ID: Nicholas Wilkerson, male    DOB: [birth year], 83 y.o.   MRN: [id number]  HPI Here for seeing bright red blood in the urine off and on for the past 4 weeks. No urgency to urinate or burning. No back pain or fever. He was seen here on 06-25-24 for this, and his UA had 3+ blood in it. No signs of infection, and a culture of this urine showed no growth. He was given 7 days of Bactrim  DS, but this has had no effect.    Review of Systems  Constitutional: Negative.   Respiratory: Negative.    Cardiovascular: Negative.   Gastrointestinal: Negative.   Genitourinary:  Positive for hematuria. Negative for difficulty urinating, dysuria, flank pain, frequency and urgency.       Objective:   Physical Exam Constitutional:      Appearance: Normal appearance.  Cardiovascular:     Rate and Rhythm: Normal rate and regular rhythm.     Pulses: Normal pulses.     Heart sounds: Normal heart sounds.  Pulmonary:     Effort: Pulmonary effort is normal.     Breath sounds: Normal breath sounds.  Abdominal:     Tenderness: There is no right CVA tenderness or left CVA tenderness.  Neurological:     Mental Status: He is alert.           Assessment & Plan:  Hematuria, refer to Urology.  Garnette Olmsted, MD

## 2024-08-15 DIAGNOSIS — R310 Gross hematuria: Secondary | ICD-10-CM | POA: Diagnosis not present

## 2024-08-15 DIAGNOSIS — N401 Enlarged prostate with lower urinary tract symptoms: Secondary | ICD-10-CM | POA: Diagnosis not present

## 2024-08-15 DIAGNOSIS — R351 Nocturia: Secondary | ICD-10-CM | POA: Diagnosis not present

## 2024-08-15 DIAGNOSIS — R399 Unspecified symptoms and signs involving the genitourinary system: Secondary | ICD-10-CM | POA: Diagnosis not present

## 2024-08-27 DIAGNOSIS — H401112 Primary open-angle glaucoma, right eye, moderate stage: Secondary | ICD-10-CM | POA: Diagnosis not present

## 2024-09-05 ENCOUNTER — Other Ambulatory Visit (HOSPITAL_COMMUNITY): Payer: Self-pay

## 2024-09-05 DIAGNOSIS — N400 Enlarged prostate without lower urinary tract symptoms: Secondary | ICD-10-CM | POA: Diagnosis not present

## 2024-09-05 DIAGNOSIS — R310 Gross hematuria: Secondary | ICD-10-CM | POA: Diagnosis not present

## 2024-09-12 DIAGNOSIS — L720 Epidermal cyst: Secondary | ICD-10-CM | POA: Diagnosis not present

## 2024-09-12 DIAGNOSIS — L570 Actinic keratosis: Secondary | ICD-10-CM | POA: Diagnosis not present

## 2024-09-12 DIAGNOSIS — Z85828 Personal history of other malignant neoplasm of skin: Secondary | ICD-10-CM | POA: Diagnosis not present

## 2024-09-18 ENCOUNTER — Telehealth: Payer: Self-pay

## 2024-09-18 NOTE — Progress Notes (Signed)
   09/18/2024  Patient ID: Nicholas Wilkerson, male   DOB: Feb 21, 1941, 82 y.o.   MRN: [id number]  Received phone call from patient regarding renewal of his assistance programs for Jardiance /Entresto .  Patient qualifies for copay assistance through [facility] for Jardiance  and Entresto .  Renewed and approved through 09/22/2025. Supplied pharmacy with new card info, sending to patient via mychart. Attached below for reference    Jon VEAR Lindau, PharmD Clinical Pharmacist [phone]

## 2024-09-19 DIAGNOSIS — R319 Hematuria, unspecified: Secondary | ICD-10-CM | POA: Diagnosis not present

## 2024-09-28 ENCOUNTER — Ambulatory Visit: Payer: PPO

## 2024-09-28 DIAGNOSIS — I428 Other cardiomyopathies: Secondary | ICD-10-CM

## 2024-09-29 LAB — CUP PACEART REMOTE DEVICE CHECK
Battery Remaining Longevity: 88 mo
Battery Remaining Percentage: 70 %
Battery Voltage: 3.01 V
Brady Statistic RV Percent Paced: 1 %
Date Time Interrogation Session: 20251219020043
HighPow Impedance: 64 Ohm
Implantable Lead Connection Status: 753985
Implantable Lead Implant Date: 20220623
Implantable Lead Location: 753860
Implantable Lead Model: 7122
Implantable Pulse Generator Implant Date: 20220623
Lead Channel Impedance Value: 400 Ohm
Lead Channel Pacing Threshold Amplitude: 0.75 V
Lead Channel Pacing Threshold Pulse Width: 0.5 ms
Lead Channel Sensing Intrinsic Amplitude: 11.8 mV
Lead Channel Setting Pacing Amplitude: 2.5 V
Lead Channel Setting Pacing Pulse Width: 0.5 ms
Lead Channel Setting Sensing Sensitivity: 0.5 mV
Pulse Gen Serial Number: 810027079
Zone Setting Status: 755011

## 2024-10-01 NOTE — Progress Notes (Signed)
 Remote ICD Transmission

## 2024-10-05 ENCOUNTER — Ambulatory Visit: Payer: Self-pay | Admitting: Cardiology

## 2024-10-24 ENCOUNTER — Encounter: Payer: Self-pay | Admitting: Family Medicine

## 2024-10-24 ENCOUNTER — Other Ambulatory Visit (HOSPITAL_COMMUNITY): Payer: Self-pay

## 2024-10-24 ENCOUNTER — Ambulatory Visit: Admitting: Family Medicine

## 2024-10-24 VITALS — BP 110/70 | HR 100 | Temp 97.7°F | Ht 69.0 in | Wt 155.0 lb

## 2024-10-24 DIAGNOSIS — E039 Hypothyroidism, unspecified: Secondary | ICD-10-CM | POA: Diagnosis not present

## 2024-10-24 DIAGNOSIS — Z0000 Encounter for general adult medical examination without abnormal findings: Secondary | ICD-10-CM

## 2024-10-24 MED ORDER — LEVOTHYROXINE SODIUM 75 MCG PO TABS: 75.0000 ug | ORAL_TABLET | Freq: Every day | ORAL | 3 refills | Status: AC

## 2024-10-24 MED ORDER — ROSUVASTATIN CALCIUM 5 MG PO TABS: 5.0000 mg | ORAL_TABLET | ORAL | 3 refills | Status: AC

## 2024-10-24 MED ORDER — JARDIANCE 10 MG PO TABS: 10.0000 mg | ORAL_TABLET | Freq: Every day | ORAL | 3 refills | Status: AC

## 2024-10-24 MED ORDER — SODIUM FLUORIDE 1.1 % DT GEL
1.0000 | Freq: Every day | DENTAL | 11 refills | Status: AC
  Filled 2024-10-24: qty 56, 56d supply, fill #0

## 2024-10-24 NOTE — Progress Notes (Signed)
 "  Subjective:    Patient ID: Nicholas Wilkerson, male    DOB: 1941/03/26, 84 y.o.   MRN: [id number]  HPI Here for a well exam. He feels well in general. We saw him last fall for hematuria, and he saw Dr. Renda for a workup. Cystoscopy revealed an area of irritation from his prostate procedure, and he has not seen any blood since then. He sees Dr. Dolan for CKD. He sees Dr. Jordan for CHF, dilated cardiomyopathy, and frequent PVC's.    Review of Systems  Constitutional: Negative.   HENT: Negative.    Eyes: Negative.   Respiratory: Negative.    Cardiovascular: Negative.   Gastrointestinal: Negative.   Genitourinary: Negative.   Musculoskeletal: Negative.   Skin: Negative.   Neurological: Negative.   Psychiatric/Behavioral: Negative.         Objective:   Physical Exam Constitutional:      General: He is not in acute distress.    Appearance: Normal appearance. He is well-developed. He is not diaphoretic.  HENT:     Head: Normocephalic and atraumatic.     Right Ear: External ear normal.     Left Ear: External ear normal.     Nose: Nose normal.     Mouth/Throat:     Pharynx: No oropharyngeal exudate.  Eyes:     General: No scleral icterus.       Right eye: No discharge.        Left eye: No discharge.     Conjunctiva/sclera: Conjunctivae normal.     Pupils: Pupils are equal, round, and reactive to light.  Neck:     Thyroid : No thyromegaly.     Vascular: No JVD.     Trachea: No tracheal deviation.  Cardiovascular:     Rate and Rhythm: Normal rate. Rhythm irregular.     Pulses: Normal pulses.     Heart sounds: Normal heart sounds. No murmur heard.    No friction rub. No gallop.  Pulmonary:     Effort: Pulmonary effort is normal. No respiratory distress.     Breath sounds: Normal breath sounds. No wheezing or rales.  Chest:     Chest wall: No tenderness.  Abdominal:     General: Bowel sounds are normal. There is no distension.     Palpations: Abdomen is soft. There is  no mass.     Tenderness: There is no abdominal tenderness. There is no guarding or rebound.  Genitourinary:    Penis: No tenderness.   Musculoskeletal:        General: No tenderness. Normal range of motion.     Cervical back: Neck supple.  Lymphadenopathy:     Cervical: No cervical adenopathy.  Skin:    General: Skin is warm and dry.     Coloration: Skin is not pale.     Findings: No erythema or rash.  Neurological:     General: No focal deficit present.     Mental Status: He is alert and oriented to person, place, and time.     Cranial Nerves: No cranial nerve deficit.     Motor: No abnormal muscle tone.     Coordination: Coordination normal.     Deep Tendon Reflexes: Reflexes are normal and symmetric. Reflexes normal.  Psychiatric:        Mood and Affect: Mood normal.        Behavior: Behavior normal.        Thought Content: Thought content normal.  Judgment: Judgment normal.            Assessment & Plan:  Well exam. We discussed diet and exercise. Get fasting labs. Garnette Olmsted, MD    "

## 2024-10-25 ENCOUNTER — Other Ambulatory Visit (HOSPITAL_COMMUNITY): Payer: Self-pay

## 2024-10-25 ENCOUNTER — Telehealth: Payer: Self-pay

## 2024-10-25 LAB — HEPATIC FUNCTION PANEL
ALT: 14 U/L (ref 3–53)
AST: 18 U/L (ref 5–37)
Albumin: 4.4 g/dL (ref 3.5–5.2)
Alkaline Phosphatase: 71 U/L (ref 39–117)
Bilirubin, Direct: 0.1 mg/dL (ref 0.1–0.3)
Total Bilirubin: 0.8 mg/dL (ref 0.2–1.2)
Total Protein: 6.4 g/dL (ref 6.0–8.3)

## 2024-10-25 LAB — CBC WITH DIFFERENTIAL/PLATELET
Basophils Absolute: 0.1 K/uL (ref 0.0–0.1)
Basophils Relative: 1 % (ref 0.0–3.0)
Eosinophils Absolute: 0.1 K/uL (ref 0.0–0.7)
Eosinophils Relative: 1.2 % (ref 0.0–5.0)
HCT: 50.2 % (ref 39.0–52.0)
Hemoglobin: 16.5 g/dL (ref 13.0–17.0)
Lymphocytes Relative: 16.7 % (ref 12.0–46.0)
Lymphs Abs: 1.2 K/uL (ref 0.7–4.0)
MCHC: 32.9 g/dL (ref 30.0–36.0)
MCV: 92.3 fl (ref 78.0–100.0)
Monocytes Absolute: 0.8 K/uL (ref 0.1–1.0)
Monocytes Relative: 11.6 % (ref 3.0–12.0)
Neutro Abs: 4.9 K/uL (ref 1.4–7.7)
Neutrophils Relative %: 69.5 % (ref 43.0–77.0)
Platelets: 187 K/uL (ref 150.0–400.0)
RBC: 5.44 Mil/uL (ref 4.22–5.81)
RDW: 14.1 % (ref 11.5–15.5)
WBC: 7 K/uL (ref 4.0–10.5)

## 2024-10-25 LAB — BASIC METABOLIC PANEL WITH GFR
BUN: 28 mg/dL — ABNORMAL HIGH (ref 6–23)
CO2: 27 meq/L (ref 19–32)
Calcium: 9.1 mg/dL (ref 8.4–10.5)
Chloride: 106 meq/L (ref 96–112)
Creatinine, Ser: 1.79 mg/dL — ABNORMAL HIGH (ref 0.40–1.50)
GFR: 35.19 mL/min — ABNORMAL LOW
Glucose, Bld: 112 mg/dL — ABNORMAL HIGH (ref 70–99)
Potassium: 4.2 meq/L (ref 3.5–5.1)
Sodium: 140 meq/L (ref 135–145)

## 2024-10-25 LAB — PSA: PSA: 1.82 ng/mL (ref 0.10–4.00)

## 2024-10-25 LAB — TSH: TSH: 1.52 u[IU]/mL (ref 0.35–5.50)

## 2024-10-25 LAB — LIPID PANEL
Cholesterol: 139 mg/dL (ref 28–200)
HDL: 44.6 mg/dL
LDL Cholesterol: 69 mg/dL (ref 10–99)
NonHDL: 94.27
Total CHOL/HDL Ratio: 3
Triglycerides: 127 mg/dL (ref 10.0–149.0)
VLDL: 25.4 mg/dL (ref 0.0–40.0)

## 2024-10-25 LAB — T3, FREE: T3, Free: 2.5 pg/mL (ref 2.3–4.2)

## 2024-10-25 LAB — HEMOGLOBIN A1C: Hgb A1c MFr Bld: 6 % (ref 4.6–6.5)

## 2024-10-25 LAB — T4, FREE: Free T4: 0.95 ng/dL (ref 0.60–1.60)

## 2024-10-25 NOTE — Progress Notes (Signed)
" ° °  10/25/2024  Patient ID: Nicholas Wilkerson, male   DOB: [birth year], 84 y.o.   MRN: [id number]  Patient called in reporting issues with using his [employer] copay card for Jardiance  at his pharmacy.  Spoke with pharmacy. They thought card was not compatible with medicare insurance. Confirmed that it was. Now ready for $0 copay, patient aware.  Jon VEAR Lindau, PharmD Clinical Pharmacist [phone]  "

## 2024-10-26 ENCOUNTER — Ambulatory Visit: Payer: Self-pay | Admitting: Family Medicine

## 2024-10-27 ENCOUNTER — Other Ambulatory Visit (HOSPITAL_COMMUNITY): Payer: Self-pay

## 2024-10-29 ENCOUNTER — Other Ambulatory Visit: Payer: Self-pay

## 2024-11-05 MED ORDER — SACUBITRIL-VALSARTAN 49-51 MG PO TABS: 1.0000 | ORAL_TABLET | Freq: Two times a day (BID) | ORAL | 1 refills | Status: AC

## 2024-11-05 NOTE — Telephone Encounter (Signed)
 Labs on 10/24/24 outside of Normal  In accordance with refill protocols, please review and address the following requirements before this medication refill can be authorized:  Labs

## 2024-11-08 ENCOUNTER — Other Ambulatory Visit: Payer: Self-pay | Admitting: Cardiology

## 2024-11-21 ENCOUNTER — Ambulatory Visit: Admitting: Cardiology
